# Patient Record
Sex: Female | Born: 1985
Health system: Southern US, Community
[De-identification: ages and names within clinical notes are randomized; demographics above are authoritative.]

## PROBLEM LIST (undated history)

## (undated) DIAGNOSIS — M5136 Other intervertebral disc degeneration, lumbar region: Secondary | ICD-10-CM

## (undated) DIAGNOSIS — R519 Headache, unspecified: Secondary | ICD-10-CM

## (undated) DIAGNOSIS — M3119 Other thrombotic microangiopathy: Secondary | ICD-10-CM

## (undated) DIAGNOSIS — R51 Headache: Secondary | ICD-10-CM

## (undated) DIAGNOSIS — H47099 Other disorders of optic nerve, not elsewhere classified, unspecified eye: Secondary | ICD-10-CM

## (undated) DIAGNOSIS — M311 Thrombotic microangiopathy: Secondary | ICD-10-CM

## (undated) DIAGNOSIS — I639 Cerebral infarction, unspecified: Secondary | ICD-10-CM

## (undated) DIAGNOSIS — M51369 Other intervertebral disc degeneration, lumbar region without mention of lumbar back pain or lower extremity pain: Secondary | ICD-10-CM

## (undated) HISTORY — PX: WISDOM TOOTH EXTRACTION: SHX21

## (undated) HISTORY — PX: CHOLECYSTECTOMY: SHX55

## (undated) HISTORY — DX: Cerebral infarction, unspecified: I63.9

## (undated) HISTORY — DX: Headache, unspecified: R51.9

## (undated) HISTORY — DX: Headache: R51

---
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
## 2005-12-13 DIAGNOSIS — M311 Thrombotic microangiopathy: Secondary | ICD-10-CM

## 2006-02-16 ENCOUNTER — Inpatient Hospital Stay (HOSPITAL_COMMUNITY): Admission: AD | Admit: 2006-02-16 | Discharge: 2006-02-17 | Payer: Self-pay | Admitting: Gynecology

## 2006-03-19 ENCOUNTER — Inpatient Hospital Stay (HOSPITAL_COMMUNITY): Admission: AD | Admit: 2006-03-19 | Discharge: 2006-03-19 | Payer: Self-pay | Admitting: Obstetrics & Gynecology

## 2006-06-06 ENCOUNTER — Inpatient Hospital Stay (HOSPITAL_COMMUNITY): Admission: AD | Admit: 2006-06-06 | Discharge: 2006-06-06 | Payer: Self-pay | Admitting: Obstetrics and Gynecology

## 2006-11-19 ENCOUNTER — Emergency Department (HOSPITAL_COMMUNITY): Admission: EM | Admit: 2006-11-19 | Discharge: 2006-11-19 | Payer: Self-pay | Admitting: Emergency Medicine

## 2007-06-27 ENCOUNTER — Inpatient Hospital Stay (HOSPITAL_COMMUNITY): Admission: AD | Admit: 2007-06-27 | Discharge: 2007-06-27 | Payer: Self-pay | Admitting: *Deleted

## 2007-09-18 ENCOUNTER — Emergency Department (HOSPITAL_COMMUNITY): Admission: EM | Admit: 2007-09-18 | Discharge: 2007-09-18 | Payer: Self-pay | Admitting: Emergency Medicine

## 2007-10-17 ENCOUNTER — Other Ambulatory Visit: Admission: RE | Admit: 2007-10-17 | Discharge: 2007-10-17 | Payer: Self-pay | Admitting: Obstetrics and Gynecology

## 2008-01-09 ENCOUNTER — Emergency Department (HOSPITAL_COMMUNITY): Admission: EM | Admit: 2008-01-09 | Discharge: 2008-01-09 | Payer: Self-pay | Admitting: Emergency Medicine

## 2008-03-24 ENCOUNTER — Inpatient Hospital Stay (HOSPITAL_COMMUNITY): Admission: EM | Admit: 2008-03-24 | Discharge: 2008-03-25 | Payer: Self-pay | Admitting: Emergency Medicine

## 2008-03-24 ENCOUNTER — Emergency Department (HOSPITAL_COMMUNITY): Admission: EM | Admit: 2008-03-24 | Discharge: 2008-03-24 | Payer: Self-pay | Admitting: Emergency Medicine

## 2008-03-25 ENCOUNTER — Encounter (INDEPENDENT_AMBULATORY_CARE_PROVIDER_SITE_OTHER): Payer: Self-pay | Admitting: Surgery

## 2008-03-28 ENCOUNTER — Emergency Department (HOSPITAL_COMMUNITY): Admission: EM | Admit: 2008-03-28 | Discharge: 2008-03-28 | Payer: Self-pay | Admitting: Emergency Medicine

## 2008-03-31 ENCOUNTER — Emergency Department (HOSPITAL_COMMUNITY): Admission: EM | Admit: 2008-03-31 | Discharge: 2008-03-31 | Payer: Self-pay | Admitting: Emergency Medicine

## 2008-08-27 ENCOUNTER — Emergency Department (HOSPITAL_COMMUNITY): Admission: EM | Admit: 2008-08-27 | Discharge: 2008-08-28 | Payer: Self-pay | Admitting: Emergency Medicine

## 2008-09-28 ENCOUNTER — Emergency Department (HOSPITAL_COMMUNITY): Admission: EM | Admit: 2008-09-28 | Discharge: 2008-09-28 | Payer: Self-pay | Admitting: Emergency Medicine

## 2009-04-27 ENCOUNTER — Emergency Department (HOSPITAL_COMMUNITY): Admission: EM | Admit: 2009-04-27 | Discharge: 2009-04-27 | Payer: Self-pay | Admitting: Emergency Medicine

## 2009-07-27 ENCOUNTER — Emergency Department (HOSPITAL_COMMUNITY): Admission: EM | Admit: 2009-07-27 | Discharge: 2009-07-28 | Payer: Self-pay | Admitting: Emergency Medicine

## 2009-09-24 ENCOUNTER — Emergency Department (HOSPITAL_COMMUNITY): Admission: EM | Admit: 2009-09-24 | Discharge: 2009-09-24 | Payer: Self-pay | Admitting: Emergency Medicine

## 2009-09-29 ENCOUNTER — Emergency Department (HOSPITAL_COMMUNITY): Admission: EM | Admit: 2009-09-29 | Discharge: 2009-09-29 | Payer: Self-pay | Admitting: Emergency Medicine

## 2009-09-30 ENCOUNTER — Inpatient Hospital Stay (HOSPITAL_COMMUNITY): Admission: EM | Admit: 2009-09-30 | Discharge: 2009-10-04 | Payer: Self-pay | Admitting: Emergency Medicine

## 2009-09-30 LAB — CONVERTED CEMR LAB
ALT: 18 units/L
HCT: 39.3 %
Hemoglobin: 13.6 g/dL
Sodium: 137 meq/L
Total Protein: 6.5 g/dL
WBC: 15.9 10*3/uL

## 2009-10-01 DIAGNOSIS — N12 Tubulo-interstitial nephritis, not specified as acute or chronic: Secondary | ICD-10-CM | POA: Insufficient documentation

## 2009-10-15 ENCOUNTER — Ambulatory Visit: Payer: Self-pay | Admitting: Internal Medicine

## 2009-10-15 ENCOUNTER — Encounter (INDEPENDENT_AMBULATORY_CARE_PROVIDER_SITE_OTHER): Payer: Self-pay | Admitting: Nurse Practitioner

## 2009-10-15 DIAGNOSIS — L503 Dermatographic urticaria: Secondary | ICD-10-CM

## 2009-10-15 DIAGNOSIS — F17200 Nicotine dependence, unspecified, uncomplicated: Secondary | ICD-10-CM

## 2009-10-15 LAB — CONVERTED CEMR LAB
Basophils Absolute: 0 10*3/uL (ref 0.0–0.1)
Basophils Relative: 1 % (ref 0–1)
Bilirubin Urine: NEGATIVE
Eosinophils Absolute: 0.3 10*3/uL (ref 0.0–0.7)
Eosinophils Relative: 4 % (ref 0–5)
Glucose, Urine, Semiquant: NEGATIVE
Ketones, urine, test strip: NEGATIVE
Lymphocytes Relative: 23 % (ref 12–46)
Lymphs Abs: 1.8 10*3/uL (ref 0.7–4.0)
MCHC: 31.9 g/dL (ref 30.0–36.0)
Monocytes Absolute: 0.6 10*3/uL (ref 0.1–1.0)
Monocytes Relative: 8 % (ref 3–12)
Neutro Abs: 5 10*3/uL (ref 1.7–7.7)
Neutrophils Relative %: 65 % (ref 43–77)
Platelets: 311 10*3/uL (ref 150–400)
RBC: 4.48 M/uL (ref 3.87–5.11)
Urobilinogen, UA: 0.2

## 2009-10-16 ENCOUNTER — Encounter (INDEPENDENT_AMBULATORY_CARE_PROVIDER_SITE_OTHER): Payer: Self-pay | Admitting: Nurse Practitioner

## 2009-11-15 ENCOUNTER — Emergency Department (HOSPITAL_COMMUNITY): Admission: EM | Admit: 2009-11-15 | Discharge: 2009-11-15 | Payer: Self-pay | Admitting: Emergency Medicine

## 2009-11-24 ENCOUNTER — Ambulatory Visit: Payer: Self-pay | Admitting: Nurse Practitioner

## 2009-11-24 DIAGNOSIS — H6120 Impacted cerumen, unspecified ear: Secondary | ICD-10-CM

## 2009-11-24 DIAGNOSIS — L408 Other psoriasis: Secondary | ICD-10-CM | POA: Insufficient documentation

## 2009-11-24 LAB — CONVERTED CEMR LAB
Albumin: 4.5 g/dL (ref 3.5–5.2)
Basophils Relative: 1 % (ref 0–1)
Bilirubin Urine: NEGATIVE
Blood in Urine, dipstick: NEGATIVE
CO2: 27 meq/L (ref 19–32)
Calcium: 9.5 mg/dL (ref 8.4–10.5)
Chlamydia, DNA Probe: NEGATIVE
Chloride: 104 meq/L (ref 96–112)
Cholesterol: 197 mg/dL (ref 0–200)
GC Probe Amp, Genital: NEGATIVE
HCT: 41.2 % (ref 36.0–46.0)
Hemoglobin: 13.8 g/dL (ref 12.0–15.0)
KOH Prep: NEGATIVE
Lymphs Abs: 2.1 10*3/uL (ref 0.7–4.0)
MCHC: 33.5 g/dL (ref 30.0–36.0)
MCV: 91.6 fL (ref 78.0–100.0)
Monocytes Absolute: 0.5 10*3/uL (ref 0.1–1.0)
Neutro Abs: 4.8 10*3/uL (ref 1.7–7.7)
Neutrophils Relative %: 62 % (ref 43–77)
Platelets: 284 10*3/uL (ref 150–400)
RBC: 4.5 M/uL (ref 3.87–5.11)
RDW: 13.7 % (ref 11.5–15.5)
Rapid HIV Screen: NEGATIVE
Specific Gravity, Urine: 1.02
Triglycerides: 85 mg/dL (ref <150)
Urobilinogen, UA: 0.2
WBC Urine, dipstick: NEGATIVE
WBC: 7.7 10*3/uL (ref 4.0–10.5)

## 2009-11-27 ENCOUNTER — Encounter (INDEPENDENT_AMBULATORY_CARE_PROVIDER_SITE_OTHER): Payer: Self-pay | Admitting: Nurse Practitioner

## 2009-11-28 ENCOUNTER — Encounter (INDEPENDENT_AMBULATORY_CARE_PROVIDER_SITE_OTHER): Payer: Self-pay | Admitting: Nurse Practitioner

## 2010-01-13 ENCOUNTER — Ambulatory Visit: Payer: Self-pay | Admitting: Nurse Practitioner

## 2010-01-13 ENCOUNTER — Ambulatory Visit (HOSPITAL_COMMUNITY): Admission: RE | Admit: 2010-01-13 | Discharge: 2010-01-13 | Payer: Self-pay | Admitting: Nurse Practitioner

## 2010-01-13 DIAGNOSIS — B86 Scabies: Secondary | ICD-10-CM

## 2010-01-13 DIAGNOSIS — M79609 Pain in unspecified limb: Secondary | ICD-10-CM

## 2010-02-02 ENCOUNTER — Telehealth (INDEPENDENT_AMBULATORY_CARE_PROVIDER_SITE_OTHER): Payer: Self-pay | Admitting: Nurse Practitioner

## 2010-02-02 ENCOUNTER — Emergency Department (HOSPITAL_COMMUNITY): Admission: EM | Admit: 2010-02-02 | Discharge: 2010-02-02 | Payer: Self-pay | Admitting: Emergency Medicine

## 2010-02-04 ENCOUNTER — Ambulatory Visit: Payer: Self-pay | Admitting: Nurse Practitioner

## 2010-02-04 DIAGNOSIS — G44209 Tension-type headache, unspecified, not intractable: Secondary | ICD-10-CM

## 2010-02-05 ENCOUNTER — Encounter (INDEPENDENT_AMBULATORY_CARE_PROVIDER_SITE_OTHER): Payer: Self-pay | Admitting: Nurse Practitioner

## 2010-02-05 ENCOUNTER — Telehealth (INDEPENDENT_AMBULATORY_CARE_PROVIDER_SITE_OTHER): Payer: Self-pay | Admitting: Nurse Practitioner

## 2010-02-05 ENCOUNTER — Emergency Department (HOSPITAL_COMMUNITY): Admission: EM | Admit: 2010-02-05 | Discharge: 2010-02-05 | Payer: Self-pay | Admitting: Emergency Medicine

## 2010-02-06 ENCOUNTER — Encounter (INDEPENDENT_AMBULATORY_CARE_PROVIDER_SITE_OTHER): Payer: Self-pay | Admitting: Nurse Practitioner

## 2010-03-19 ENCOUNTER — Emergency Department (HOSPITAL_COMMUNITY): Admission: EM | Admit: 2010-03-19 | Discharge: 2010-03-20 | Payer: Self-pay | Admitting: Emergency Medicine

## 2010-03-28 ENCOUNTER — Emergency Department (HOSPITAL_COMMUNITY): Admission: EM | Admit: 2010-03-28 | Discharge: 2010-03-28 | Payer: Self-pay | Admitting: Emergency Medicine

## 2010-06-09 ENCOUNTER — Ambulatory Visit: Payer: Self-pay | Admitting: Nurse Practitioner

## 2010-06-09 DIAGNOSIS — K089 Disorder of teeth and supporting structures, unspecified: Secondary | ICD-10-CM | POA: Insufficient documentation

## 2010-12-07 ENCOUNTER — Emergency Department (HOSPITAL_COMMUNITY)
Admission: EM | Admit: 2010-12-07 | Discharge: 2010-12-07 | Payer: Self-pay | Source: Home / Self Care | Admitting: Emergency Medicine

## 2011-01-14 NOTE — Progress Notes (Signed)
Summary: Office Visit/DEPRESSION SCREENING  Office Visit/DEPRESSION SCREENING   Imported By: Arta Bruce 01/08/2010 13:02:30  _____________________________________________________________________  External Attachment:    Type:   Image     Comment:   External Document

## 2011-01-14 NOTE — Letter (Signed)
Summary: REGARDING PT'S LOA POLICY  REGARDING PT'S LOA POLICY   Imported By: Arta Bruce 02/06/2010 09:41:55  _____________________________________________________________________  External Attachment:    Type:   Image     Comment:   External Document

## 2011-01-14 NOTE — Letter (Signed)
Summary: Work Excuse  [facility]  [address] [place], [place] [address]   Phone: [phone]  Fax: [phone]    Today's Date: February 04, 2010  Name of Patient: Krista Maddox  The above named patient had a medical visit today   Please take this into consideration when reviewing the time away from work  Special Instructions:  [  ] None  [ X ] To be off the remainder of today, returning to the normal work schedule February 07, 2010 ( if she is not scheduled to work on this date then she may return on her next scheduled day to work following this date)  [  ] To be off until the next scheduled appointment on ______________________.  [  ] Other ________________________________________________________________ ________________________________________________________________________   Sincerely yours,   Lehman Prom FNP [facility]

## 2011-01-14 NOTE — Progress Notes (Signed)
Summary: JOB FAXED POLICY ABOUT BEING OUT SICK  Phone Note Call from Patient Call back at Home Phone [phone]   Summary of Call: Krista Maddox PT. Krista Maddox  JOB FAXED OVER THE POLICY ABOUT BEING OUT. SHE SAYS THE DR AT THE URGENT CARE WROTE HER OUT FOR 3 DAYS AND 3 DAYS IS NOT ACCEPTABLE ON HER JOB, IT HAS TO BE 5 DAYS  WE CAN CALL HER WHEN ITS READY Initial call taken by: Leodis Rains,  February 05, 2010 9:33 AM  Follow-up for Phone Call        was it faxed to this office as well.  I would like to see it Follow-up by: Lehman Prom FNP,  February 05, 2010 1:44 PM  Additional Follow-up for Phone Call Additional follow up Details #1::        YES, IT WAS FAXED AND ITS IN YOUR SLOTS THAT SAY REFILLS Additional Follow-up by: Leodis Rains,  February 05, 2010 3:07 PM    Additional Follow-up for Phone Call Additional follow up Details #2::    This policy is about the leave of absenceand not the specific absentee policy. It speaks about PTO so can pt not take a PTO day if she is only out for 1-2 days???  If so, then this is what she should be doing instead of taking leave of absence every time she has an illness. I don't think headaches necessarily qualifies for LOA but given the policy I was sent and have reviewed will write for the additional 2 days out of work to total 5 days (ER wrote 3 days off) letter in basket - does she want it faxed or will she pick up? Follow-up by: Lehman Prom FNP,  February 05, 2010 5:23 PM  Additional Follow-up for Phone Call Additional follow up Details #3:: Details for Additional Follow-up Action Taken: Levon Hedger  February 06, 2010 12:46 PM Left message on machine for pt to return call to the office.  FYI: spoke with Krista Maddox and she is already back at work but she just wanted you to know thanks for trying but she never heard anything back. I did advise that someone left her a message on 2/25. Pt stated she only needed the letter because she  hadn't worked there long enough to use PTO.Marland KitchenShe no longer needs the letter but thank you..........Marland KitchenMikey College CMA  February 12, 2010 11:17 AM   noted.  That answers my question. Pt does not have PTO which is why she has to keep using LOA n.martin,fnp February 12, 2010  7:21 PM

## 2011-01-14 NOTE — Progress Notes (Signed)
Summary: migranes/no available appts  Phone Note Call from Patient Call back at Home Phone [phone]   Summary of Call: pt [place] calling stating that she has a cold and has been having migranes for the last 2wks. she has tried otc meds with no relief. pt uses [employer]...did advise pt that I would forward information to provider..she has already missed 2 days of work due to [employer] per patient.pt states cold started today. Initial call taken by: Mikey [employer] CMA,  February 02, 2010 8:35 AM  Follow-up for Phone Call        if pt is having a migraine now and it won't break she will need to go to ER or urgent care.   No record of migraines in EMR so she has not been seen for this previously. Cold symptoms - advised she use over the counter cough and cold medications Follow-up by: Lehman Prom FNP,  February 02, 2010 12:38 PM  Additional Follow-up for Phone Call Additional follow up Details #1::        pt informed of above information. Additional Follow-up by: Levon Hedger,  February 02, 2010 2:56 PM

## 2011-01-14 NOTE — Assessment & Plan Note (Signed)
Summary: Dental problems   Vital Signs:  Patient profile:   25 year old female Weight:      215.7 pounds BMI:     35.21 BSA:     2.06 Temp:     9.0 degrees F oral Pulse rate:   89 / minute Pulse rhythm:   regular Resp:     20 per minute BP sitting:   118 / 81  (left arm) Cuff size:   regular  Vitals Entered By: Levon Hedger (June 09, 2010 9:08 AM) CC: broken tooth pain for 2 days Is Patient Diabetic? No Pain Assessment Patient in pain? yes     Location: tooth Intensity: 10 Type: throbbing Onset of pain  Constant  Does patient need assistance? Functional Status Self care Ambulation Normal   CC:  broken tooth pain for 2 days.  History of Present Illness:  Pt into the office with tooth problems. Pt was eating a piece of ice 2 days ago and tooth broke. She has tried [employer], oragel and clove gel along with excedrin, ibuprofen which has not eliminated pain and discomfort No dental care in over 2 years No fever No swelling of jaw but tenderness of gum unable to eat on the left side.  Food is at room temperature  Tobacco use continues  Otherwise pt is doing ok.  No chronic issues  Habits & Providers  Alcohol-Tobacco-Diet     Alcohol drinks/day: <1     Alcohol Counseling: not indicated; use of alcohol is not excessive or problematic     Tobacco Status: current     Tobacco Counseling: to remain off tobacco products     Cigarette Packs/Day: 0.5     Year Started: age 50  Exercise-Depression-Behavior     Does Patient Exercise: yes     Exercise Counseling: not indicated; exercise is adequate     Type of exercise: walking     Depression Counseling: not indicated; screening negative for depression  Allergies (verified): No Known Drug Allergies  Review of Systems General:  Denies fever; +mouth pain. CV:  Denies chest pain or discomfort. Resp:  Denies cough. GI:  Denies abdominal pain, nausea, and vomiting.  Physical Exam  General:  alert.   Nose:  left  nasal piercing Mouth:  discoloration - poor dentation multiple caries  left lower back molar - broken, gingiva inflammed, tender Msk:  normal ROM.   Neurologic:  alert & oriented X3.   Psych:  Oriented X3.     Impression & Recommendations:  Problem # 1:  DENTAL PAIN (ICD-525.9)  will refer to the dental clinic but advised pt that there is a wait list wil given pain meds  antibiotics for pt to take only if signs of infection start Orders: Dental Referral (Dentist)  Problem # 2:  TOBACCO ABUSE (ICD-305.1) advised pt that she need to quit smoking  Complete Medication List: 1)  Ibuprofen 800 Mg Tabs (Ibuprofen) .... One tablet by mouth two times a day as needed for swelling 2)  Loratadine 10 Mg Tabs (Loratadine) .... One tablet by mouth daily for allergies 3)  Triamcinolone Acetonide 0.1 % Oint (Triamcinolone acetonide) .... One application topically two times a day as needed to affected area 4)  Betamethasone Dipropionate 0.05 % Crea (Betamethasone dipropionate) .... Apply to affected area two times a day topically until healed 5)  Tramadol Hcl 50 Mg Tabs (Tramadol hcl) .... One tablet by mouth two times a day as needed for pain 6)  Penicillin V Potassium 500  Mg Tabs (Penicillin v potassium) .... One tablet by mouth three times a day for infection 7)  Tylenol With Codeine #3 300-30 Mg Tabs (Acetaminophen-codeine) .... One tablet by mouth two times a day as needed for pain  Patient Instructions: 1)  A referral will be sent to the dental clinic.  They will notify you of the time/date of the appointment.  There is a wait list 2)  Pain - this may get better over the next 2-3 days.  May take tylenol #3 as needed for pain 3)  Infection - not present at this time but signs for infection are fever, jaw swellling, inflammed gums 4)  Follow up as needed Prescriptions: TYLENOL WITH CODEINE #3 300-30 MG TABS (ACETAMINOPHEN-CODEINE) One tablet by mouth two times a day as needed for pain  #30 x  0   Entered and Authorized by:   Lehman Prom FNP   Signed by:   Lehman Prom FNP on 06/09/2010   Method used:   Print then Give to Patient   RxID:   [id number] PENICILLIN V POTASSIUM 500 MG TABS (PENICILLIN V POTASSIUM) One tablet by mouth three times a day for infection  #30 x 0   Entered and Authorized by:   Lehman Prom FNP   Signed by:   Lehman Prom FNP on 06/09/2010   Method used:   Print then Give to Patient   RxID:   [id number]

## 2011-01-14 NOTE — Letter (Signed)
Summary: Out of Work  [facility]  [address] [place], [place] [address]   Phone: [phone]  Fax: [phone]    January 13, 2010   Employee:  Krista Maddox Herrington    To Whom It May Concern:   For Medical reasons, please excuse the above named employee from work for the following dates:  Start:   January 13, 2010 (Tuesday)  End:   January 20, 2010 (Tuesday)  If you need additional information, please feel free to contact our office.         Sincerely,    Lehman Prom FNP [facility]

## 2011-01-14 NOTE — Letter (Signed)
Summary: Work Excuse  [facility]  [address] [place], [place] [address]   Phone: [phone]  Fax: [phone]    Today's Date: June 09, 2010  Name of Patient: Krista Maddox  The above named patient had a medical visit today   Please take this into consideration when reviewing the time away from work.  She may return to normal work schedule.    Sincerely yours,   Lehman Prom FNP [facility]

## 2011-01-14 NOTE — Letter (Signed)
Summary: REFERRAL DENTAL  REFERRAL DENTAL   Imported By: Arta Bruce 06/10/2010 12:30:41  _____________________________________________________________________  External Attachment:    Type:   Image     Comment:   External Document

## 2011-01-14 NOTE — Assessment & Plan Note (Signed)
Summary: Left foot injury/Scabies   Vital Signs:  Patient profile:   25 year old female Height:      65.75 inches Weight:      210 pounds BMI:     34.28 Temp:     97.0 degrees F oral Pulse rate:   76 / minute Pulse rhythm:   regular Resp:     18 per minute BP sitting:   112 / 80  (left arm) Cuff size:   regular  Vitals Entered By: [place] Shannon (January 13, 2010 8:39 AM) CC: pt is her for broken ankle and would like to get checked..... her child was exposed to scabies and she wants to make sure she doesn't catches it since someone in family has.... Is Patient Diabetic? No Pain Assessment Patient in pain? no       Does patient need assistance? Functional Status Self care Ambulation Normal   CC:  pt is her for broken ankle and would like to get checked..... her child was exposed to scabies and she wants to make sure she doesn't catches it since someone in family has.....  History of Present Illness:  Pt into the office for follow up on left foot. Pt was planning and was running to the car and someone inadvertantly closed the door on her foot. She presents today with a cam boot that she had from a previous injury. Reports that she had a previous fracture to her left foot about 2 years ago and also one prior to thatl +swelling +discoloration Trauma 1 week ago. She has been wearing the cam boot since that time. Pt has to do a lot of walking at work. 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 Also has to walk a long distance to the job and then when she returns. She has not missed any time from work for this injury.  Scabies - Pt's daughter was exposed to scabies and now pt currently has few areas on her arms. Currently a few raised areas to her left arm that itch worse at night  Current Medications (verified): 1)  Tramadol Hcl 50 Mg Tabs (Tramadol Hcl) .... Take One Tablet By Mouth Three Times A  Day As Needed *wagdy Elmahdy,md 2)  Loratadine 10 Mg Tabs (Loratadine) .... One Tablet By Mouth Daily For Allergies 3)  Triamcinolone Acetonide 0.1 % Oint (Triamcinolone Acetonide) .... One Application Topically Two Times A Day As Needed To Affected Area 4)  Betamethasone Dipropionate 0.05 % Crea (Betamethasone Dipropionate) .... Apply To Affected Area Two Times A Day Topically Until Healed 5)  Prednisone 10 Mg Tabs (Prednisone) .... 30mg  X 3 Days Then 20mg  X 2 Days By Mouth For Inflammation  Allergies (verified): No Known Drug Allergies  Review of Systems CV:  Denies chest pain or discomfort. Resp:  Denies cough. GI:  Denies abdominal pain, nausea, and vomiting. MS:  Complains of joint pain.  Physical Exam  General:  alert.   Head:  normocephalic.   Lungs:  normal breath sounds.   Heart:  normal rate and regular rhythm.   Neurologic:  alert & oriented X3.   Skin:  bil arms with several sporatic papsules Psych:  Oriented X3.     Foot/Ankle Exam  Foot Exam:    Left:    Inspection:  Abnormal    Palpation:  Abnormal       Location:  forefoot    Stability:  unstable    Tenderness:  yes    Swelling:  yes    Erythema:  yes   Impression & Recommendations:  Problem # 1:  FOOT PAIN, LEFT (ICD-729.5) Will write order for pt to be out of work for the rest of the week Orders: Radiology other (Radiology Other)  Problem # 2:  SCABIES (ICD-133.0) Assessment: Unchanged will treat given recent exposure advised pt that she needs to clean home  Complete Medication List: 1)  Ibuprofen 800 Mg Tabs (Ibuprofen) .... One tablet by mouth two times a day as needed for swelling 2)  Loratadine 10 Mg Tabs (Loratadine) .... One tablet by mouth daily for allergies 3)  Triamcinolone Acetonide 0.1 % Oint (Triamcinolone acetonide) .... One application topically two times a day as needed to affected area 4)  Betamethasone Dipropionate 0.05 % Crea (Betamethasone dipropionate) .... Apply to  affected area two times a day topically until healed 5)  Permethrin 5 % Crea (Permethrin) .... Massage cream into entire body, wash off after 8-14 hrs 6)  Tramadol Hcl 50 Mg Tabs (Tramadol hcl) .... One tablet by mouth two times a day as needed for pain  Patient Instructions: 1)  You will need to get your left foot xray. 2)  You will be informed of the results. 3)  Take ibuprofen 800mg  - 1 tablet by mouth two times a day as needed  4)  You will need to take a week after work. 5)  Keep the [place] in place. 6)  Use the Permethrin cream to body.  Let sit for at least 8 hours then rinse off Prescriptions: TRAMADOL HCL 50 MG TABS (TRAMADOL HCL) One tablet by mouth two times a day as needed for pain  #50 x 1   Entered and Authorized by:   Lehman Prom FNP   Signed by:   Lehman Prom FNP on 01/13/2010   Method used:   Print then Give to Patient   RxID:   [id number] IBUPROFEN 800 MG TABS (IBUPROFEN) One tablet by mouth two times a day as needed for swelling  #60 x 1   Entered and Authorized by:   Lehman Prom FNP   Signed by:   Lehman Prom FNP on 01/13/2010   Method used:   Print then Give to Patient   RxID:   [id number] PERMETHRIN 5 % CREA (PERMETHRIN) Massage cream into entire body, wash off after 8-14 hrs  #60gm x 0   Entered and Authorized by:   Lehman Prom FNP   Signed by:   Lehman Prom FNP on 01/13/2010   Method used:   Print then Give to Patient   RxID:   [id number]

## 2011-01-14 NOTE — Assessment & Plan Note (Signed)
Summary: Tension Headache   Vital Signs:  Patient profile:   25 year old female LMP:     01/13/2010 Weight:      212.1 pounds BMI:     34.62 BSA:     2.05 Temp:     98.2 degrees F oral Pulse rate:   106 / minute Pulse rhythm:   regular Resp:     20 per minute BP sitting:   124 / 80  (left arm) Cuff size:   regular  Vitals Entered By: Levon Hedger (February 04, 2010 3:01 PM) CC: migraine headache x 1 month ago, Headache Is Patient Diabetic? No Pain Assessment Patient in pain? yes     Location: headache Intensity: 8  Does patient need assistance? Functional Status Self care Ambulation Normal LMP (date): 01/13/2010 LMP - Character: normal     Menstrual flow (days): 5 Enter LMP: 01/13/2010 Last PAP Result  Specimen Adequacy: Satisfactory for evaluation.   Interpretation/Result:Negative for intraepithelial Lesion or Malignancy.      CC:  migraine headache x 1 month ago and Headache.  History of Present Illness:  Pt into the office with complaints of daily headaches for the past month. No previous history of headaches but for the past month headache present when she wakes Light affects the eyes wears sunglasses at woke No neck pain  Pt was seen in the ER on 02/02/2010 and was started on amitriptyline 50mg  by mouth nightly, prednisone 20mg  by mouth daily x 5 days, then metoclopramide 10mg  by mouth three times a day as needed for nausea or migraine.   Amitriptyline does make the pt sleepy but headache resumes when she gets up  Increased stressors: Daughter will need surgery for GI problems.  March 9th child will need a barium enemia then child will need surgery Grandfather passed this week and service was today. Sleep problems also started with the onset of this bad news.  Pt is an Designer, television/film set for [employer].   Headache HPI:      She has approximately 5+ headaches per month.  There is no family history of migraine headaches.        On a scale of 1-10, she describes the  headache as a 5.  The headaches are not associated with an aura.  The location of the headaches are bilateral.  Headache quality is throbbing or pulsating.  The headaches are associated with nausea and photophobia.        The patient denies new or progressive H/A lasting days.        Additional history: Pt has taken tylenol without resolution of the symptoms. She went to the Urgent on 02/02/2010 and was given several rx.    Headache Treatment History:      She has tried NSAID's which were effective.  Tricyclic antidepressants seemed effective but she had side effects with these.     Medications Prior to Update: 1)  Ibuprofen 800 Mg Tabs (Ibuprofen) .... One Tablet By Mouth Two Times A Day As Needed For Swelling 2)  Loratadine 10 Mg Tabs (Loratadine) .... One Tablet By Mouth Daily For Allergies 3)  Triamcinolone Acetonide 0.1 % Oint (Triamcinolone Acetonide) .... One Application Topically Two Times A Day As Needed To Affected Area 4)  Betamethasone Dipropionate 0.05 % Crea (Betamethasone Dipropionate) .... Apply To Affected Area Two Times A Day Topically Until Healed 5)  Permethrin 5 % Crea (Permethrin) .... Massage Cream Into Entire Body, Wash Off After 8-14 Hrs 6)  Tramadol Hcl 50 Mg Tabs (  Tramadol Hcl) .... One Tablet By Mouth Two Times A Day As Needed For Pain  Allergies (verified): No Known Drug Allergies  Review of Systems CV:  Denies chest pain or discomfort. Resp:  Denies cough. GI:  Denies abdominal pain, nausea, and vomiting. Neuro:  Complains of headaches.  Physical Exam  General:  alert.   Head:  normocephalic.   Nose:  left nasal piercing Mouth:  discolored Lungs:  normal breath sounds.   Heart:  normal rate and regular rhythm.   Neurologic:  alert & oriented X3.   Skin:  color normal.   Psych:  Oriented X3.     Impression & Recommendations:  Problem # 1:  HEADACHE, TENSION (ICD-307.81) advised pt of the dx pt can continue current meds Her updated medication  list for this problem includes:    Ibuprofen 800 Mg Tabs (Ibuprofen) ..... One tablet by mouth two times a day as needed for swelling    Tramadol Hcl 50 Mg Tabs (Tramadol hcl) ..... One tablet by mouth two times a day as needed for pain  Complete Medication List: 1)  Ibuprofen 800 Mg Tabs (Ibuprofen) .... One tablet by mouth two times a day as needed for swelling 2)  Loratadine 10 Mg Tabs (Loratadine) .... One tablet by mouth daily for allergies 3)  Triamcinolone Acetonide 0.1 % Oint (Triamcinolone acetonide) .... One application topically two times a day as needed to affected area 4)  Betamethasone Dipropionate 0.05 % Crea (Betamethasone dipropionate) .... Apply to affected area two times a day topically until healed 5)  Tramadol Hcl 50 Mg Tabs (Tramadol hcl) .... One tablet by mouth two times a day as needed for pain  Patient Instructions: 1)  Continue medications as ordered 2)  Take ibuprofen during the day and amitriptyline at night as needed for sleep 3)  Follow up as needed. 4)  BRING A COPY OF YOUR ATTENDANCE POLICY TO THIS OFFICE

## 2011-01-14 NOTE — Letter (Signed)
Summary: Handout Printed  Printed Handout:  - Headache, Tension (Muscle Contraction Headache)

## 2011-02-23 ENCOUNTER — Emergency Department (HOSPITAL_COMMUNITY)
Admission: EM | Admit: 2011-02-23 | Discharge: 2011-02-23 | Discharge status: Against Medical Advice | Payer: Medicaid Other | Attending: Emergency Medicine | Admitting: Emergency Medicine

## 2011-02-23 ENCOUNTER — Emergency Department (HOSPITAL_COMMUNITY): Payer: Medicaid Other

## 2011-02-23 DIAGNOSIS — M545 Low back pain, unspecified: Secondary | ICD-10-CM | POA: Insufficient documentation

## 2011-02-23 LAB — URINALYSIS, ROUTINE W REFLEX MICROSCOPIC
Glucose, UA: NEGATIVE mg/dL
Hgb urine dipstick: NEGATIVE
Nitrite: NEGATIVE
Protein, ur: NEGATIVE mg/dL
Specific Gravity, Urine: 1.019 (ref 1.005–1.030)
Urobilinogen, UA: 1 mg/dL (ref 0.0–1.0)

## 2011-03-03 LAB — URINALYSIS, ROUTINE W REFLEX MICROSCOPIC
Bilirubin Urine: NEGATIVE
Glucose, UA: NEGATIVE mg/dL
Hgb urine dipstick: NEGATIVE
Ketones, ur: NEGATIVE mg/dL
Nitrite: NEGATIVE
Specific Gravity, Urine: 1.013 (ref 1.005–1.030)
Specific Gravity, Urine: 1.022 (ref 1.005–1.030)
Urobilinogen, UA: 0.2 mg/dL (ref 0.0–1.0)
pH: 7.5 (ref 5.0–8.0)

## 2011-03-03 LAB — URINE CULTURE

## 2011-03-03 LAB — CBC
HCT: 36.3 % (ref 36.0–46.0)
Hemoglobin: 12.3 g/dL (ref 12.0–15.0)
MCHC: 33.9 g/dL (ref 30.0–36.0)
Platelets: 189 10*3/uL (ref 150–400)

## 2011-03-03 LAB — PREGNANCY, URINE: Preg Test, Ur: NEGATIVE

## 2011-03-03 LAB — COMPREHENSIVE METABOLIC PANEL
ALT: 14 U/L (ref 0–35)
AST: 19 U/L (ref 0–37)
Albumin: 3.6 g/dL (ref 3.5–5.2)
Alkaline Phosphatase: 61 U/L (ref 39–117)
Calcium: 8.7 mg/dL (ref 8.4–10.5)
Creatinine, Ser: 0.65 mg/dL (ref 0.4–1.2)
Potassium: 3.7 mEq/L (ref 3.5–5.1)

## 2011-03-03 LAB — DIFFERENTIAL
Basophils Absolute: 0 10*3/uL (ref 0.0–0.1)
Basophils Relative: 1 % (ref 0–1)
Eosinophils Absolute: 0.1 10*3/uL (ref 0.0–0.7)
Lymphocytes Relative: 18 % (ref 12–46)
Lymphs Abs: 0.9 10*3/uL (ref 0.7–4.0)
Monocytes Absolute: 0.5 10*3/uL (ref 0.1–1.0)

## 2011-03-03 LAB — POCT PREGNANCY, URINE: Preg Test, Ur: NEGATIVE

## 2011-03-05 LAB — POCT I-STAT, CHEM 8
Chloride: 95 mEq/L — ABNORMAL LOW (ref 96–112)
Creatinine, Ser: 5.1 mg/dL — ABNORMAL HIGH (ref 0.4–1.2)
Glucose, Bld: 387 mg/dL — ABNORMAL HIGH (ref 70–99)
HCT: 36 % (ref 36.0–46.0)
Hemoglobin: 12.2 g/dL (ref 12.0–15.0)
Potassium: 4 mEq/L (ref 3.5–5.1)
Sodium: 134 mEq/L — ABNORMAL LOW (ref 135–145)

## 2011-03-18 LAB — BASIC METABOLIC PANEL
BUN: 4 mg/dL — ABNORMAL LOW (ref 6–23)
BUN: 5 mg/dL — ABNORMAL LOW (ref 6–23)
CO2: 25 mEq/L (ref 19–32)
Chloride: 103 mEq/L (ref 96–112)
Creatinine, Ser: 0.63 mg/dL (ref 0.4–1.2)
Creatinine, Ser: 0.74 mg/dL (ref 0.4–1.2)
GFR calc Af Amer: >60 mL/min (ref >60)
GFR calc non Af Amer: >60 mL/min (ref >60)
GFR calc non Af Amer: >60 mL/min (ref >60)
Glucose, Bld: 109 mg/dL — ABNORMAL HIGH (ref 70–99)
Potassium: 3.7 mEq/L (ref 3.5–5.1)
Potassium: 4.1 mEq/L (ref 3.5–5.1)
Potassium: 4.2 mEq/L (ref 3.5–5.1)
Sodium: 135 mEq/L (ref 135–145)

## 2011-03-18 LAB — CBC
HCT: 36.3 % (ref 36.0–46.0)
HCT: 39 % (ref 36.0–46.0)
HCT: 39.3 % (ref 36.0–46.0)
HCT: 40 % (ref 36.0–46.0)
Hemoglobin: 12.5 g/dL (ref 12.0–15.0)
Hemoglobin: 13.1 g/dL (ref 12.0–15.0)
Hemoglobin: 13.6 g/dL (ref 12.0–15.0)
MCHC: 34.7 g/dL (ref 30.0–36.0)
MCV: 91.5 fL (ref 78.0–100.0)
MCV: 93.1 fL (ref 78.0–100.0)
MCV: 93.1 fL (ref 78.0–100.0)
Platelets: 177 10*3/uL (ref 150–400)
Platelets: 179 10*3/uL (ref 150–400)
Platelets: 226 10*3/uL (ref 150–400)
RBC: 3.91 MIL/uL (ref 3.87–5.11)
RBC: 4.19 MIL/uL (ref 3.87–5.11)
RDW: 13.4 % (ref 11.5–15.5)
RDW: 13.5 % (ref 11.5–15.5)
WBC: 10.9 10*3/uL — ABNORMAL HIGH (ref 4.0–10.5)
WBC: 8.5 10*3/uL (ref 4.0–10.5)

## 2011-03-18 LAB — CULTURE, BLOOD (SINGLE): Culture: NO GROWTH

## 2011-03-18 LAB — DIFFERENTIAL
Basophils Absolute: 0 10*3/uL (ref 0.0–0.1)
Basophils Relative: 0 % (ref 0–1)
Lymphocytes Relative: 18 % (ref 12–46)
Monocytes Absolute: 0.8 10*3/uL (ref 0.1–1.0)
Monocytes Relative: 5 % (ref 3–12)
Neutro Abs: 12.1 10*3/uL — ABNORMAL HIGH (ref 1.7–7.7)
Neutrophils Relative %: 76 % (ref 43–77)

## 2011-03-18 LAB — COMPREHENSIVE METABOLIC PANEL
Albumin: 3.4 g/dL — ABNORMAL LOW (ref 3.5–5.2)
Alkaline Phosphatase: 68 U/L (ref 39–117)
BUN: 8 mg/dL (ref 6–23)
Creatinine, Ser: 0.73 mg/dL (ref 0.4–1.2)
Glucose, Bld: 115 mg/dL — ABNORMAL HIGH (ref 70–99)
Potassium: 3.9 mEq/L (ref 3.5–5.1)
Total Protein: 6.5 g/dL (ref 6.0–8.3)

## 2011-03-18 LAB — URINALYSIS, ROUTINE W REFLEX MICROSCOPIC
Bilirubin Urine: NEGATIVE
Glucose, UA: NEGATIVE mg/dL
Ketones, ur: NEGATIVE mg/dL
Nitrite: NEGATIVE
Nitrite: NEGATIVE
Nitrite: POSITIVE — AB
Protein, ur: 30 mg/dL — AB
Specific Gravity, Urine: 1.011 (ref 1.005–1.030)
Urobilinogen, UA: 0.2 mg/dL (ref 0.0–1.0)
Urobilinogen, UA: 2 mg/dL — ABNORMAL HIGH (ref 0.0–1.0)
pH: 6 (ref 5.0–8.0)
pH: 6.5 (ref 5.0–8.0)

## 2011-03-18 LAB — URINE CULTURE: Culture: NO GROWTH

## 2011-03-18 LAB — URINE MICROSCOPIC-ADD ON

## 2011-03-18 LAB — POCT PREGNANCY, URINE: Preg Test, Ur: NEGATIVE

## 2011-03-20 LAB — CBC
HCT: 37.2 % (ref 36.0–46.0)
Hemoglobin: 13 g/dL (ref 12.0–15.0)
WBC: 8.4 10*3/uL (ref 4.0–10.5)

## 2011-03-23 LAB — URINALYSIS, ROUTINE W REFLEX MICROSCOPIC
Glucose, UA: NEGATIVE mg/dL
Hgb urine dipstick: NEGATIVE
Specific Gravity, Urine: 1.025 (ref 1.005–1.030)
pH: 6 (ref 5.0–8.0)

## 2011-03-23 LAB — CBC
HCT: 38.7 % (ref 36.0–46.0)
MCV: 92.5 fL (ref 78.0–100.0)
RBC: 4.18 MIL/uL (ref 3.87–5.11)
WBC: 9.5 10*3/uL (ref 4.0–10.5)

## 2011-03-23 LAB — COMPREHENSIVE METABOLIC PANEL
ALT: 16 U/L (ref 0–35)
Alkaline Phosphatase: 64 U/L (ref 39–117)
BUN: 11 mg/dL (ref 6–23)
CO2: 27 mEq/L (ref 19–32)
Chloride: 107 mEq/L (ref 96–112)
Glucose, Bld: 92 mg/dL (ref 70–99)
Potassium: 3.7 mEq/L (ref 3.5–5.1)
Sodium: 140 mEq/L (ref 135–145)
Total Bilirubin: 0.7 mg/dL (ref 0.3–1.2)
Total Protein: 6.7 g/dL (ref 6.0–8.3)

## 2011-03-23 LAB — DIFFERENTIAL
Basophils Absolute: 0 10*3/uL (ref 0.0–0.1)
Basophils Relative: 0 % (ref 0–1)
Eosinophils Absolute: 0.2 10*3/uL (ref 0.0–0.7)
Monocytes Relative: 5 % (ref 3–12)
Neutro Abs: 6.8 10*3/uL (ref 1.7–7.7)
Neutrophils Relative %: 72 % (ref 43–77)

## 2011-04-27 NOTE — Op Note (Signed)
NAME:  MODESTINE, SCHERZINGER            ACCOUNT NO.:  [id number]   MEDICAL RECORD NO.:  [id number]          PATIENT TYPE:  INP   LOCATION:  1514                         FACILITY:  [facility]   PHYSICIAN:  Sandria Bales. Ezzard Standing, M.D.  DATE OF BIRTH:  1986-05-19   DATE OF PROCEDURE:  03/25/2008  DATE OF DISCHARGE:                               OPERATIVE REPORT   Date of Surgery ???   PREOPERATIVE DIAGNOSIS:  Chronic cholecystitis and cholelithiasis.   POSTOPERATIVE DIAGNOSIS:  Chronic cholecystitis and cholelithiasis   PROCEDURE:  Laparoscopic cholecystectomy with cholangiogram.   SURGEON:  Ovidio Kin, M.D.   FIRST ASSISTANT:  None.   ANESTHESIA:  General endotracheal with 30 mL of 0.25% Marcaine.   COMPLICATIONS:  None.   INDICATIONS FOR PROCEDURE:  Ms. Beth is a 25 year old white female  who is a patient of Dr. Deatra James who presented to the [facility]  emergency room twice yesterday on March 25, 2007.  The first time she  came with some abdominal pain, nausea and vomiting.  She was sent home  and then returned and was then admitted by Dr. Baruch Merl.  Ms.  Bachtell was found to have multiple gallstones.  Her symptoms were  consistent with cholecystitis.  Dr. Colin Benton asked if I would proceed with  Ms. Ashley Royalty' surgery.   I discussed with Ms. Ashley Royalty the indications, potential complications  of gallbladder surgery.  Potential complications include, but not  limited to, bleeding, infection, bile duct injury and the possibly of  open surgery.   OPERATIVE NOTE:  The patient was placed in the supine position and given  a general endotracheal anesthetic.  Her abdomen was prepped with  antibiotic solution and sterilely draped.   A time-out was held identifying the patient and procedure.  An  infraumbilical incision was made with sharp dissection carried down to  the abdominal cavity.  A 10 mm Hasson trocar was inserted into the  abdominal cavity and secured with a 0 Vicryl suture.   Three additional  trocars were placed; a 10 mm subxiphoid trocar, a 5 mm mid subcostal and  5 mm lateral subcostal.  Abdominal exploration with the endoscope  revealed right and left lobes of liver unremarkable.  Stomach  unremarkable.  The gallbladder had some adhesions down around the neck  of the gallbladder.  I swung the scope into the pelvis.  She did have  adhesions of omentum to her lower abdominal wall from a prior C-section  scar, but these were well away from her upper abdomen.  I left these  alone.   I then grasped the gallbladder, rotated it cephalad, dissected out the  cystic duct gallbladder junction.  I placed a clip on the gallbladder  side of the cystic duct and shot intraoperative cholangiogram.   The intraoperative cholangiogram was shot using cutoff Taut catheter  inserted through a 14 gauge Jelco and into the side of the cystic duct.  The Taut catheter was secured with an Endo clip.  I used about 8 mL of  half-strength Hypaque solution that I injected under fluoroscopy.  This  showed free flow of contrast  down the common bile duct, cystic into the  common bile again into the duodenum and up the hepatic radicals.  There  was no filling defect, no masses, felt to be a normal intraoperative  cholangiogram.   The Taut catheter was then removed.  The cystic duct triply endo clipped  and divided.  There was an anterior and posterior branch of the cystic  artery identified and this was doubly endo clipped and divided.  I  visualized well the triangle of Calot.  I then divided the gallbladder  away from the gallbladder bed using primarily hook Bovie coagulation.  Prior to complete division of the gallbladder from the gallbladder bed,  I revisualized the triangle of Calot.  I revisualized the cystic duct  and there was no bleeding or bile leak.   The gallbladder was then placed in an EndoCatch bag and delivered  through the umbilicus.  The abdomen was irrigated with a  liter of  saline.  The umbilical port was then closed with 0 Vicryl suture.  Each  port was then removed in turn.  The skin at each site was closed with a  4-0 Monocryl suture painted with tincture of Benzoin and Steri-Strips.  Sponge and needle count were correct at the end of the case.   The patient tolerated the procedure well, was transported to the  recovery room in good condition.      Sandria Bales. Ezzard Standing, M.D.  Electronically Signed     DHN/MEDQ  D:  03/25/2008  T:  03/25/2008  Job:  [id number]   cc:   Deatra James, M.D.  Fax: [phone]

## 2011-04-27 NOTE — H&P (Signed)
NAMENEELEY, SEDIVY NO.:  [id number]   MEDICAL RECORD NO.:  [id number]          PATIENT TYPE:  INP   LOCATION:  1514                         FACILITY:  [facility]   PHYSICIAN:  Alfonse Ras, MD   DATE OF BIRTH:  05-Jun-1986   DATE OF ADMISSION:  03/24/2008  DATE OF DISCHARGE:  03/25/2008                              HISTORY & PHYSICAL   HISTORY OF PRESENT ILLNESS:  The patient is a 25 year old white female  with acute onset of abdominal pain yesterday morning.  She was seen in  the [facility] Emergency Room and evaluated.  She was found to have  cholelithiasis with normal common bile duct diameter, normal liver  function test, and normal white count.  She was sent home with pain  medicine; however, began having some nausea and vomiting at home and  returned to the emergency room.  She is readmitted for laparoscopic  cholecystectomy.   PAST MEDICAL HISTORY:  None.   PAST SURGICAL HISTORY:  Significant for C-section.   MEDICATIONS:  None.   ALLERGIES:  She has no known drug allergies.   REVIEW OF SYSTEMS:  Significant only as above and is negative for  acholic stools or dark urine.   PHYSICAL EXAMINATION:  General:  She is awake, alert and appropriate and  in no apparent distress.  Her temperature is 98.6, heart rate is 86,  respiratory rate is 16, and blood pressure is 114/70.  HEENT EXAM:  Benign, normocephalic, and atraumatic. Pupils are equal,  round, and reactive to light.  NECK:  Supple and soft without thyromegaly or cervical adenopathy.  LUNGS:  Clear to auscultation, percussion x2.  HEART:  Regular, rate and rhythm without murmurs, rubs, or gallops.  ABDOMEN:  Soft, minimally tender in the epigastrium only to deep  palpation.  EXTREMITIES:  Show no clubbing, cyanosis, or edema.  PSYCHIATRIC EXAM:  Shows good insight and judgment.  She is a full time  student and understands discussions about her surgical procedure.   IMPRESSION:  Symptomatic  cholelithiasis.   PLAN:  Laparoscopic cholecystotomy with  intraoperative cholangiogram by  Dr. Ezzard Standing and myself.   Of note:  I have discussed the risk of the bleeding, infection, injury  to adjacent organs, anesthesia complications and the like with the  patient.  All the questions were answered and she would like to proceed.      Alfonse Ras, MD  Electronically Signed     KRE/MEDQ  D:  03/25/2008  T:  03/25/2008  Job:  [id number]
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
## 2011-09-02 LAB — LIPASE, BLOOD: Lipase: 15

## 2011-09-02 LAB — COMPREHENSIVE METABOLIC PANEL
ALT: 11
Alkaline Phosphatase: 66
BUN: 6
CO2: 23
GFR calc non Af Amer: >60
Glucose, Bld: 89
Potassium: 3.3 — ABNORMAL LOW
Sodium: 135
Total Bilirubin: 1.2

## 2011-09-02 LAB — URINALYSIS, ROUTINE W REFLEX MICROSCOPIC
Glucose, UA: NEGATIVE
Hgb urine dipstick: NEGATIVE
Ketones, ur: NEGATIVE
Protein, ur: NEGATIVE
Urobilinogen, UA: 0.2

## 2011-09-02 LAB — DIFFERENTIAL
Basophils Absolute: 0
Basophils Relative: 0
Eosinophils Absolute: 0.1
Monocytes Relative: 7
Neutrophils Relative %: 86 — ABNORMAL HIGH

## 2011-09-02 LAB — POCT PREGNANCY, URINE
Operator id: 261601
Preg Test, Ur: NEGATIVE

## 2011-09-02 LAB — CBC
HCT: 35.4 — ABNORMAL LOW
Hemoglobin: 12.2
RBC: 3.97

## 2011-09-07 LAB — CBC
HCT: 42
HCT: 41.7
HCT: 43
Hemoglobin: 13.4
Hemoglobin: 13.8
Hemoglobin: 14.4
MCHC: 32.8
MCHC: 33.3
MCHC: 33.5
MCV: 91.1
MCV: 90.6
Platelets: 227
Platelets: 201
RBC: 4.61
RBC: 4.62
RBC: 4.74
RDW: 13.7
RDW: 14.1
RDW: 13.8
WBC: 11.2 — ABNORMAL HIGH
WBC: 8.4

## 2011-09-07 LAB — PREGNANCY, URINE
Preg Test, Ur: NEGATIVE
Preg Test, Ur: NEGATIVE

## 2011-09-07 LAB — COMPREHENSIVE METABOLIC PANEL
ALT: 17
AST: 40 — ABNORMAL HIGH
Alkaline Phosphatase: 107
BUN: 6
BUN: 6
BUN: 7
CO2: 25
CO2: 27
Calcium: 9
Calcium: 9.3
Chloride: 104
Chloride: 106
Creatinine, Ser: 0.67
GFR calc Af Amer: >60
GFR calc non Af Amer: >60
GFR calc non Af Amer: >60
Glucose, Bld: 92
Glucose, Bld: 84
Potassium: 4.4
Sodium: 139
Total Bilirubin: 1.5 — ABNORMAL HIGH
Total Bilirubin: 1.7 — ABNORMAL HIGH
Total Protein: 6.5
Total Protein: 7.4

## 2011-09-07 LAB — LIPASE, BLOOD: Lipase: 18

## 2011-09-07 LAB — URINALYSIS, ROUTINE W REFLEX MICROSCOPIC
Bilirubin Urine: NEGATIVE
Glucose, UA: NEGATIVE
Hgb urine dipstick: NEGATIVE
Hgb urine dipstick: NEGATIVE
Ketones, ur: NEGATIVE
Nitrite: NEGATIVE
Nitrite: NEGATIVE
Protein, ur: NEGATIVE
Protein, ur: NEGATIVE
Specific Gravity, Urine: 1.007
Specific Gravity, Urine: 1.023
Urobilinogen, UA: 0.2
Urobilinogen, UA: 1
pH: 6

## 2011-09-07 LAB — DIFFERENTIAL
Basophils Absolute: 0
Basophils Absolute: 0
Basophils Relative: 0
Basophils Relative: 1
Eosinophils Absolute: 0.1
Eosinophils Absolute: 0.2
Eosinophils Relative: 1
Eosinophils Relative: 2
Lymphocytes Relative: 13
Lymphocytes Relative: 17
Lymphs Abs: 1.4
Lymphs Abs: 1.9
Lymphs Abs: 1.4
Lymphs Abs: 2.5
Monocytes Absolute: 0.1
Monocytes Absolute: 0.5
Monocytes Relative: 1 — ABNORMAL LOW
Monocytes Relative: 6
Monocytes Relative: 4
Neutro Abs: 6.1
Neutro Abs: 9.6 — ABNORMAL HIGH
Neutro Abs: 6.4
Neutrophils Relative %: 70
Neutrophils Relative %: 86 — ABNORMAL HIGH
Neutrophils Relative %: 76

## 2011-09-07 LAB — URINE CULTURE

## 2011-09-07 LAB — PROTIME-INR
INR: 0.9
Prothrombin Time: 12.2

## 2011-09-07 LAB — APTT: aPTT: 26

## 2011-09-07 LAB — POCT PREGNANCY, URINE: Preg Test, Ur: NEGATIVE

## 2011-09-13 LAB — PREGNANCY, URINE: Preg Test, Ur: NEGATIVE

## 2011-09-13 LAB — URINALYSIS, ROUTINE W REFLEX MICROSCOPIC
Bilirubin Urine: NEGATIVE
Ketones, ur: NEGATIVE
Nitrite: NEGATIVE
Specific Gravity, Urine: 1.029
Urobilinogen, UA: 0.2

## 2011-09-13 LAB — CBC
Hemoglobin: 14.7
MCV: 91.9
RBC: 4.68
WBC: 9.4

## 2011-09-13 LAB — DIFFERENTIAL
Lymphs Abs: 0.4 — ABNORMAL LOW
Monocytes Absolute: 0.2
Monocytes Relative: 2 — ABNORMAL LOW
Neutro Abs: 8.7 — ABNORMAL HIGH
Neutrophils Relative %: 93 — ABNORMAL HIGH

## 2011-09-13 LAB — BASIC METABOLIC PANEL
Calcium: 8.7
Chloride: 105
Creatinine, Ser: 0.69
GFR calc Af Amer: >60
Sodium: 137

## 2011-09-13 LAB — URINE MICROSCOPIC-ADD ON

## 2011-09-13 LAB — RAPID STREP SCREEN (MED CTR MEBANE ONLY): Streptococcus, Group A Screen (Direct): NEGATIVE

## 2011-09-27 LAB — URINALYSIS, ROUTINE W REFLEX MICROSCOPIC
Nitrite: NEGATIVE
Specific Gravity, Urine: 1.01
pH: 6.5

## 2011-09-27 LAB — POCT PREGNANCY, URINE: Operator id: 22333

## 2012-02-26 ENCOUNTER — Emergency Department (HOSPITAL_BASED_OUTPATIENT_CLINIC_OR_DEPARTMENT_OTHER)
Admission: EM | Admit: 2012-02-26 | Discharge: 2012-02-27 | Disposition: A | Discharge status: Home / Self Care | Payer: Medicaid Other | Attending: Emergency Medicine | Admitting: Emergency Medicine

## 2012-02-26 ENCOUNTER — Encounter (HOSPITAL_BASED_OUTPATIENT_CLINIC_OR_DEPARTMENT_OTHER): Payer: Self-pay | Admitting: *Deleted

## 2012-02-26 ENCOUNTER — Emergency Department (INDEPENDENT_AMBULATORY_CARE_PROVIDER_SITE_OTHER): Payer: Medicaid Other

## 2012-02-26 DIAGNOSIS — R109 Unspecified abdominal pain: Secondary | ICD-10-CM | POA: Insufficient documentation

## 2012-02-26 DIAGNOSIS — R10816 Epigastric abdominal tenderness: Secondary | ICD-10-CM | POA: Insufficient documentation

## 2012-02-26 DIAGNOSIS — Z9889 Other specified postprocedural states: Secondary | ICD-10-CM | POA: Insufficient documentation

## 2012-02-26 DIAGNOSIS — F17200 Nicotine dependence, unspecified, uncomplicated: Secondary | ICD-10-CM | POA: Insufficient documentation

## 2012-02-26 DIAGNOSIS — R112 Nausea with vomiting, unspecified: Secondary | ICD-10-CM | POA: Insufficient documentation

## 2012-02-26 HISTORY — DX: Thrombotic microangiopathy: M31.1

## 2012-02-26 HISTORY — DX: Other thrombotic microangiopathy: M31.19

## 2012-02-26 LAB — URINALYSIS, ROUTINE W REFLEX MICROSCOPIC
Bilirubin Urine: NEGATIVE
Leukocytes, UA: NEGATIVE
Nitrite: NEGATIVE
Specific Gravity, Urine: 1.027 (ref 1.005–1.030)
Urobilinogen, UA: 1 mg/dL (ref 0.0–1.0)

## 2012-02-26 LAB — DIFFERENTIAL
Eosinophils Relative: 0 % (ref 0–5)
Lymphocytes Relative: 8 % — ABNORMAL LOW (ref 12–46)
Lymphs Abs: 0.9 10*3/uL (ref 0.7–4.0)
Monocytes Absolute: 0.5 10*3/uL (ref 0.1–1.0)
Monocytes Relative: 4 % (ref 3–12)

## 2012-02-26 LAB — CBC
HCT: 36.7 % (ref 36.0–46.0)
Hemoglobin: 12.5 g/dL (ref 12.0–15.0)
MCH: 30.3 pg (ref 26.0–34.0)
MCV: 89.1 fL (ref 78.0–100.0)
Platelets: 217 10*3/uL (ref 150–400)
RBC: 4.12 MIL/uL (ref 3.87–5.11)
WBC: 11.4 10*3/uL — ABNORMAL HIGH (ref 4.0–10.5)

## 2012-02-26 LAB — PREGNANCY, URINE: Preg Test, Ur: NEGATIVE

## 2012-02-26 LAB — COMPREHENSIVE METABOLIC PANEL
ALT: 32 U/L (ref 0–35)
Calcium: 9.3 mg/dL (ref 8.4–10.5)
GFR calc Af Amer: >90 mL/min (ref >90)
Glucose, Bld: 101 mg/dL — ABNORMAL HIGH (ref 70–99)
Sodium: 137 mEq/L (ref 135–145)
Total Protein: 7.7 g/dL (ref 6.0–8.3)

## 2012-02-26 LAB — URINE MICROSCOPIC-ADD ON

## 2012-02-26 MED ORDER — ONDANSETRON HCL 4 MG/2ML IJ SOLN
4.0000 mg | Freq: Once | INTRAMUSCULAR | Status: AC
  Administered 2012-02-26: 4 mg via INTRAVENOUS
  Filled 2012-02-26: qty 2

## 2012-02-26 MED ORDER — FAMOTIDINE IN NACL 20-0.9 MG/50ML-% IV SOLN
20.0000 mg | Freq: Once | INTRAVENOUS | Status: AC
  Administered 2012-02-26: 20 mg via INTRAVENOUS
  Filled 2012-02-26: qty 50

## 2012-02-26 MED ORDER — SODIUM CHLORIDE 0.9 % IV BOLUS (SEPSIS)
1000.0000 mL | Freq: Once | INTRAVENOUS | Status: AC
  Administered 2012-02-26: 1000 mL via INTRAVENOUS

## 2012-02-26 MED ORDER — MORPHINE SULFATE 4 MG/ML IJ SOLN
4.0000 mg | Freq: Once | INTRAMUSCULAR | Status: AC
  Administered 2012-02-26: 4 mg via INTRAVENOUS
  Filled 2012-02-26: qty 1

## 2012-02-26 MED ORDER — PROMETHAZINE HCL 25 MG/ML IJ SOLN
12.5000 mg | Freq: Once | INTRAMUSCULAR | Status: AC
  Administered 2012-02-26: 12.5 mg via INTRAVENOUS
  Filled 2012-02-26: qty 1

## 2012-02-26 NOTE — ED Provider Notes (Signed)
History    This chart was scribed for Cherrish Vitali A. Patrica Duel, MD, MD by Smitty Pluck. The patient was seen in room MH04 and the patient's care was started at 7:21PM.   CSN: [id number]  Arrival date & time 02/26/12  1738   First MD Initiated Contact with Patient 02/26/12 1919      Chief Complaint  Patient presents with  . Emesis    (Consider location/radiation/quality/duration/timing/severity/associated sxs/prior treatment) Patient is a 26 y.o. female presenting with vomiting. The history is provided by the patient.  Emesis    Krista Maddox is a 26 y.o. female who presents to the Emergency Department complaining of persistent emesis and nausea onset today 6:30am. Pt denies diarrhea. Pt reports vomiting all day. Pt denies sick contact and recent travel. She did not eat anything out of the ordinary. She reports being dizzy when she stands and sits up. Pt reports having stabbing upper abdominal pain. The symptoms have been constant since onset without radiation.   Past Medical History  Diagnosis Date  . TTP (thrombotic thrombocytopenic purpura)     Past Surgical History  Procedure Date  . Cholecystectomy   . Cesarean section     History reviewed. No pertinent family history.  History  Substance Use Topics  . Smoking status: Current Everyday Smoker  . Smokeless tobacco: Not on file  . Alcohol Use: No    OB History    Grav Para Term Preterm Abortions TAB SAB Ect Mult Living                  Review of Systems  Gastrointestinal: Positive for vomiting.  All other systems reviewed and are negative.   10 Systems reviewed and are negative for acute change except as noted in the HPI.  Allergies  Review of patient's allergies indicates no known allergies.  Home Medications   Current Outpatient Rx  Name Route Sig Dispense Refill  . HYDROCODONE-ACETAMINOPHEN 5-500 MG PO TABS Oral Take 1 tablet by mouth every 6 (six) hours as needed. Patient used this medication for pain.     Marland Kitchen ALEVE PO Oral Take 1 capsule by mouth daily as needed. Patient used this medication for a headache.      BP 125/68  Pulse 108  Temp(Src) 99.4 F (37.4 C) (Oral)  Resp 20  Ht 5\' 5"  (1.651 m)  Wt 195 lb (88.451 kg)  BMI 32.45 kg/m2  SpO2 100%  LMP 02/21/2012  Physical Exam  Nursing note and vitals reviewed. Constitutional: She appears well-developed and well-nourished. No distress.  HENT:  Head: Normocephalic and atraumatic.  Eyes: Conjunctivae are normal. Pupils are equal, round, and reactive to light.  Neck: Normal range of motion. Neck supple.  Cardiovascular: Normal rate, regular rhythm and normal heart sounds.   Pulmonary/Chest: Effort normal and breath sounds normal. No respiratory distress.  Abdominal: Soft. She exhibits no distension. There is tenderness (mild with deep palpation in epigastric ).  Skin: Skin is warm and dry.  Psychiatric: She has a normal mood and affect. Her behavior is normal.    ED Course  Procedures (including critical care time) DIAGNOSTIC STUDIES: Oxygen Saturation is 100% on room air, normal by my interpretation.    COORDINATION OF CARE: 7:25PM EDP discusses pt ED treatment course with pt.  7:29PM EDP ordered medication: Zofran 4 mg, NaCl 0.9% bolus, Pepcid 20 mg    Labs Reviewed  URINALYSIS, ROUTINE W REFLEX MICROSCOPIC - Abnormal; Notable for the following:    APPearance CLOUDY (*)  Hgb urine dipstick SMALL (*)    All other components within normal limits  COMPREHENSIVE METABOLIC PANEL - Abnormal; Notable for the following:    Glucose, Bld 101 (*)    All other components within normal limits  URINE MICROSCOPIC-ADD ON - Abnormal; Notable for the following:    Squamous Epithelial / LPF FEW (*)    Bacteria, UA MANY (*)    All other components within normal limits  PREGNANCY, URINE  LIPASE, BLOOD  CBC  DIFFERENTIAL   No results found.   No diagnosis found.    MDM  Pt is seen and examined;  Initial history and physical  completed.  Will follow.   I personally performed the services described in this documentation, which was scribed in my presence.  The recorded information has been reviewed and considered. Krista Adolph A. Patrica Duel, MD.  @ATTPRO @     Patient continues to have nausea, vomiting, and abdominal pain. Have ordered his additional medications and fluids and will need a CAT scan for further evaluation. Will be signed out to the overhead position in stable condition.      Krista Fortin A. Patrica Duel, MD 02/26/12 2307

## 2012-02-26 NOTE — ED Notes (Signed)
Pt states she has been vomiting all day, chills abd pain. "Feel weak"

## 2012-02-26 NOTE — ED Notes (Signed)
Given ice chips.

## 2012-02-27 DIAGNOSIS — R599 Enlarged lymph nodes, unspecified: Secondary | ICD-10-CM

## 2012-02-27 DIAGNOSIS — R1110 Vomiting, unspecified: Secondary | ICD-10-CM

## 2012-02-27 DIAGNOSIS — R109 Unspecified abdominal pain: Secondary | ICD-10-CM

## 2012-02-27 MED ORDER — OXYCODONE-ACETAMINOPHEN 5-325 MG PO TABS: 2.0000 | ORAL_TABLET | Freq: Four times a day (QID) | ORAL | Status: AC | PRN

## 2012-02-27 MED ORDER — IOHEXOL 300 MG/ML  SOLN
100.0000 mL | Freq: Once | INTRAMUSCULAR | Status: AC | PRN
  Administered 2012-02-27: 100 mL via INTRAVENOUS

## 2012-02-27 MED ORDER — ONDANSETRON 8 MG PO TBDP: 8.0000 mg | ORAL_TABLET | Freq: Three times a day (TID) | ORAL | Status: AC | PRN

## 2012-02-27 MED ORDER — IOHEXOL 300 MG/ML  SOLN: 40.0000 mL | Freq: Once | INTRAMUSCULAR | Status: AC | PRN

## 2012-02-27 NOTE — ED Provider Notes (Signed)
Patient remained stable. She was feeling better and able to tolerate oral intake. CT results did mention nonspecific lymphadenopathy that has been present since 2009 but increased in size. No acute surgical process was identified. I discussed the scan with the radiologist. There is a very broad differential for this patient's lymphadenopathy. Patient had slight leukocytosis but no other significant laboratory findings. She will need to followup with a primary care provider. She is aware that lymphoma is in the differential of possible causes of her symptoms. Patient was told that she will likely need repeat blood work and a future CAT scan. She was comfortable with plan for discharge home and was discharged with a prescription for pain and nausea medication.  1. Abdominal  pain, other specified site   2. Nausea and vomiting     Cyndra Numbers, MD 02/27/12 [phone]

## 2012-02-27 NOTE — Discharge Instructions (Signed)
Abdominal Pain  Abdominal pain can be caused by many things. Your caregiver decides the seriousness of your pain by an examination and possibly blood tests and X-rays. Many cases can be observed and treated at home. Most abdominal pain is not caused by a disease and will probably improve without treatment. However, in many cases, more time must pass before a clear cause of the pain can be found. Before that point, it may not be known if you need more testing, or if hospitalization or surgery is needed.  HOME CARE INSTRUCTIONS    Do not take laxatives unless directed by your caregiver.   Take pain medicine only as directed by your caregiver.   Only take over-the-counter or prescription medicines for pain, discomfort, or fever as directed by your caregiver.   Try a clear liquid diet (broth, tea, or water) for as long as directed by your caregiver. Slowly move to a bland diet as tolerated.  SEEK IMMEDIATE MEDICAL CARE IF:    The pain does not go away.   You have a fever.   You keep throwing up (vomiting).   The pain is felt only in portions of the abdomen. Pain in the right side could possibly be appendicitis. In an adult, pain in the left lower portion of the abdomen could be colitis or diverticulitis.   You pass bloody or black tarry stools.  MAKE SURE YOU:    Understand these instructions.   Will watch your condition.   Will get help right away if you are not doing well or get worse.  Document Released: 09/08/2005 Document Revised: 11/18/2011 Document Reviewed: 07/17/2008  [employer] Patient Information 2012 [employer].    Nausea and Vomiting  Nausea is a sick feeling that often comes before throwing up (vomiting). Vomiting is a reflex where stomach contents come out of your mouth. Vomiting can cause severe loss of body fluids (dehydration). Children and elderly adults can become dehydrated quickly, especially if they also have diarrhea. Nausea and vomiting are symptoms of a condition or disease. It  is important to find the cause of your symptoms.  CAUSES    Direct irritation of the stomach lining. This irritation can result from increased acid production (gastroesophageal reflux disease), infection, food poisoning, taking certain medicines (such as nonsteroidal anti-inflammatory drugs), alcohol use, or tobacco use.   Signals from the brain.These signals could be caused by a headache, heat exposure, an inner ear disturbance, increased pressure in the brain from injury, infection, a tumor, or a concussion, pain, emotional stimulus, or metabolic problems.   An obstruction in the gastrointestinal tract (bowel obstruction).   Illnesses such as diabetes, hepatitis, gallbladder problems, appendicitis, kidney problems, cancer, sepsis, atypical symptoms of a heart attack, or eating disorders.   Medical treatments such as chemotherapy and radiation.   Receiving medicine that makes you sleep (general anesthetic) during surgery.  DIAGNOSIS  Your caregiver may ask for tests to be done if the problems do not improve after a few days. Tests may also be done if symptoms are severe or if the reason for the nausea and vomiting is not clear. Tests may include:   Urine tests.   Blood tests.   Stool tests.   Cultures (to look for evidence of infection).   X-rays or other imaging studies.  Test results can help your caregiver make decisions about treatment or the need for additional tests.  TREATMENT  You need to stay well hydrated. Drink frequently but in small amounts.You may wish to   hydrated.When you eat, eating slowly may help prevent nausea.There are also some antinausea medicines that may help prevent nausea. HOME CARE INSTRUCTIONS   Take all medicine as directed by your caregiver.   If you do not have an appetite, do not force yourself to eat. However, you must continue to drink fluids.     If you have an appetite, eat a normal diet unless your caregiver tells you differently.   Eat a variety of complex carbohydrates (rice, wheat, potatoes, bread), lean meats, yogurt, fruits, and vegetables.   Avoid high-fat foods because they are more difficult to digest.   Drink enough water and fluids to keep your urine clear or pale yellow.   If you are dehydrated, ask your caregiver for specific rehydration instructions. Signs of dehydration may include:   Severe thirst.   Dry lips and mouth.   Dizziness.   Dark urine.   Decreasing urine frequency and amount.   Confusion.   Rapid breathing or pulse.  SEEK IMMEDIATE MEDICAL CARE IF:   You have blood or brown flecks (like coffee grounds) in your vomit.   You have black or bloody stools.   You have a severe headache or stiff neck.   You are confused.   You have severe abdominal pain.   You have chest pain or trouble breathing.   You do not urinate at least once every 8 hours.   You develop cold or clammy skin.   You continue to vomit for longer than 24 to 48 hours.   You have a fever.  MAKE SURE YOU:   Understand these instructions.   Will watch your condition.   Will get help right away if you are not doing well or get worse.  Document Released: 11/29/2005 Document Revised: 11/18/2011 Document Reviewed: 04/28/2011 [facility] Patient Information 2012 [place], [place].Nausea and Vomiting Nausea is a sick feeling that often comes before throwing up (vomiting). Vomiting is a reflex where stomach contents come out of your mouth. Vomiting can cause severe loss of body fluids (dehydration). Children and elderly adults can become dehydrated quickly, especially if they also have diarrhea. Nausea and vomiting are symptoms of a condition or disease. It is important to find the cause of your symptoms. CAUSES   Direct irritation of the stomach lining. This irritation can result from increased acid production  (gastroesophageal reflux disease), infection, food poisoning, taking certain medicines (such as nonsteroidal anti-inflammatory drugs), alcohol use, or tobacco use.   Signals from the brain.These signals could be caused by a headache, heat exposure, an inner ear disturbance, increased pressure in the brain from injury, infection, a tumor, or a concussion, pain, emotional stimulus, or metabolic problems.   An obstruction in the gastrointestinal tract (bowel obstruction).   Illnesses such as diabetes, hepatitis, gallbladder problems, appendicitis, kidney problems, cancer, sepsis, atypical symptoms of a heart attack, or eating disorders.   Medical treatments such as chemotherapy and radiation.   Receiving medicine that makes you sleep (general anesthetic) during surgery.  DIAGNOSIS Your caregiver may ask for tests to be done if the problems do not improve after a few days. Tests may also be done if symptoms are severe or if the reason for the nausea and vomiting is not clear. Tests may include:  Urine tests.   Blood tests.   Stool tests.   Cultures (to look for evidence of infection).   X-rays or other imaging studies.  Test results can help your caregiver make decisions about treatment or the need for additional  tests. TREATMENT You need to stay well hydrated. Drink frequently but in small amounts.You may wish to drink water, sports drinks, clear broth, or eat frozen ice pops or gelatin dessert to help stay hydrated.When you eat, eating slowly may help prevent nausea.There are also some antinausea medicines that may help prevent nausea. HOME CARE INSTRUCTIONS   Take all medicine as directed by your caregiver.   If you do not have an appetite, do not force yourself to eat. However, you must continue to drink fluids.   If you have an appetite, eat a normal diet unless your caregiver tells you differently.   Eat a variety of complex carbohydrates (rice, wheat, potatoes, bread), lean  meats, yogurt, fruits, and vegetables.   Avoid high-fat foods because they are more difficult to digest.   Drink enough water and fluids to keep your urine clear or pale yellow.   If you are dehydrated, ask your caregiver for specific rehydration instructions. Signs of dehydration may include:   Severe thirst.   Dry lips and mouth.   Dizziness.   Dark urine.   Decreasing urine frequency and amount.   Confusion.   Rapid breathing or pulse.  SEEK IMMEDIATE MEDICAL CARE IF:   You have blood or brown flecks (like coffee grounds) in your vomit.   You have black or bloody stools.   You have a severe headache or stiff neck.   You are confused.   You have severe abdominal pain.   You have chest pain or trouble breathing.   You do not urinate at least once every 8 hours.   You develop cold or clammy skin.   You continue to vomit for longer than 24 to 48 hours.   You have a fever.  MAKE SURE YOU:   Understand these instructions.   Will watch your condition.   Will get help right away if you are not doing well or get worse.  Document Released: 11/29/2005 Document Revised: 11/18/2011 Document Reviewed: 04/28/2011 [facility] Patient Information 2012 [place], [place].

## 2012-03-12 ENCOUNTER — Emergency Department (HOSPITAL_COMMUNITY)
Admission: EM | Admit: 2012-03-12 | Discharge: 2012-03-13 | Disposition: A | Discharge status: Home / Self Care | Payer: Medicaid Other | Attending: Emergency Medicine | Admitting: Emergency Medicine

## 2012-03-12 DIAGNOSIS — R1013 Epigastric pain: Secondary | ICD-10-CM | POA: Insufficient documentation

## 2012-03-12 DIAGNOSIS — K2900 Acute gastritis without bleeding: Secondary | ICD-10-CM | POA: Insufficient documentation

## 2012-03-12 DIAGNOSIS — Z79899 Other long term (current) drug therapy: Secondary | ICD-10-CM | POA: Insufficient documentation

## 2012-03-12 DIAGNOSIS — Z9889 Other specified postprocedural states: Secondary | ICD-10-CM | POA: Insufficient documentation

## 2012-03-12 DIAGNOSIS — F17200 Nicotine dependence, unspecified, uncomplicated: Secondary | ICD-10-CM | POA: Insufficient documentation

## 2012-03-12 DIAGNOSIS — R10816 Epigastric abdominal tenderness: Secondary | ICD-10-CM | POA: Insufficient documentation

## 2012-03-13 LAB — CBC
HCT: 39.2 % (ref 36.0–46.0)
Hemoglobin: 13.2 g/dL (ref 12.0–15.0)
MCH: 30.3 pg (ref 26.0–34.0)
RBC: 4.35 MIL/uL (ref 3.87–5.11)

## 2012-03-13 LAB — COMPREHENSIVE METABOLIC PANEL
Alkaline Phosphatase: 76 U/L (ref 39–117)
BUN: 12 mg/dL (ref 6–23)
Chloride: 102 mEq/L (ref 96–112)
GFR calc Af Amer: >90 mL/min (ref >90)
GFR calc non Af Amer: >90 mL/min (ref >90)
Glucose, Bld: 87 mg/dL (ref 70–99)
Potassium: 4.1 mEq/L (ref 3.5–5.1)
Total Bilirubin: 0.2 mg/dL — ABNORMAL LOW (ref 0.3–1.2)

## 2012-03-13 LAB — URINALYSIS, ROUTINE W REFLEX MICROSCOPIC
Bilirubin Urine: NEGATIVE
Glucose, UA: NEGATIVE mg/dL
Leukocytes, UA: NEGATIVE
Nitrite: NEGATIVE
Specific Gravity, Urine: 1.028 (ref 1.005–1.030)
pH: 6 (ref 5.0–8.0)

## 2012-03-13 LAB — DIFFERENTIAL
Lymphs Abs: 2.8 10*3/uL (ref 0.7–4.0)
Monocytes Relative: 7 % (ref 3–12)
Neutro Abs: 6 10*3/uL (ref 1.7–7.7)
Neutrophils Relative %: 62 % (ref 43–77)

## 2012-03-13 LAB — PREGNANCY, URINE: Preg Test, Ur: NEGATIVE

## 2012-03-13 MED ORDER — GI COCKTAIL ~~LOC~~
30.0000 mL | Freq: Once | ORAL | Status: AC
  Administered 2012-03-13: 30 mL via ORAL
  Filled 2012-03-13: qty 30

## 2012-03-13 MED ORDER — FAMOTIDINE 20 MG PO TABS: 20.0000 mg | ORAL_TABLET | Freq: Two times a day (BID) | ORAL | Status: DC

## 2012-03-13 MED ORDER — PANTOPRAZOLE SODIUM 40 MG IV SOLR
40.0000 mg | Freq: Once | INTRAVENOUS | Status: AC
  Administered 2012-03-13: 40 mg via INTRAVENOUS
  Filled 2012-03-13: qty 40

## 2012-03-13 MED ORDER — SODIUM CHLORIDE 0.9 % IV SOLN
Freq: Once | INTRAVENOUS | Status: AC
  Administered 2012-03-13: 01:00:00 via INTRAVENOUS

## 2012-03-13 NOTE — ED Notes (Signed)
Pt presents to ED with constant abdominal pain that has progressed this a.m. Denies N/V/D.

## 2012-03-13 NOTE — ED Notes (Signed)
Pt. Stated that medications helped a little when asked.

## 2012-03-13 NOTE — Discharge Instructions (Signed)
Gastritis  Gastritis is an irritation of the stomach. It can be caused by anything that bothers the stomach. Some irritants include:   Alcohol.    Caffeine.    Nicotine.    Spicy, acidic, greasy, and fried foods.    Medicines for pain and arthritis.    Emotional distress.   HOME CARE     Only take medicine as told by your doctor.    Take small sips of clear liquids often. Do not drink large amounts of liquids at one time.    If you have watery poop (diarrhea), avoid milk, fruit, tobacco, alcohol, really hot or cold liquids, or too much of anything at one time.    Rest.    Wash your hands often.    Once you can keep clear liquids down, start soups, juices, apple sauce, crackers, and sherbet. Slowly add plain, not spicy, foods to your diet.   GET HELP RIGHT AWAY IF:     You cannot keep fluids down.    You cannot stop throwing up (vomiting) or you throw up blood.    You have more stomach or chest pain.    You have a temperature by mouth above 102 F (38.9 C), not controlled by medicine.    You pass out (faint), feel lightheaded, or are more thirsty than normal.    You have bloody or black poop (stools).    Your watery poop will not go away.    You are not improving or are getting worse.   MAKE SURE YOU:     Understand these instructions.    Will watch your condition.    Will get help right away if you are not doing well or get worse.   Document Released: 05/17/2008 Document Revised: 11/18/2011 Document Reviewed: 10/17/2009  ExitCare Patient Information 2012 [employer].

## 2012-03-13 NOTE — ED Provider Notes (Signed)
History     CSN: [id number]  Arrival date & time 03/12/12  2323   First MD Initiated Contact with Patient 03/13/12 0047      Chief Complaint  Patient presents with  . Abdominal Pain    upper abdominal pain,  hurt for 2 to 3 weeks,  seen at [facility] ER,, did not follow up wiith doctor "due to no insurance, waiting on medicaid"    (Consider location/radiation/quality/duration/timing/severity/associated sxs/prior treatment) HPI Comments: Patient was 5-6 day history of epigastric pain, dull in nature, not made worse or better with food.  She has a history of had a cholecystectomy in the past.  Tonight.  The pain has become more persistent and sharp in nature.  Does not radiate.  She tried taking Vicodin without relief.  She is also tried [place] without relief.  She denies history of gastric reflux.  [facility] for similar abdominal pain with a normal CT scan  Patient is a 26 y.o. female presenting with abdominal pain. The history is provided by the patient.  Abdominal Pain The primary symptoms of the illness include abdominal pain. The primary symptoms of the illness do not include fever, shortness of breath, nausea, vomiting, diarrhea or dysuria. The current episode started more than 2 days ago. The onset of the illness was gradual. The problem has been gradually worsening.  The patient states that she believes she is currently not pregnant. The patient has not had a change in bowel habit. Symptoms associated with the illness do not include chills or constipation.    Past Medical History  Diagnosis Date  . TTP (thrombotic thrombocytopenic purpura)     Past Surgical History  Procedure Date  . Cholecystectomy   . Cesarean section     No family history on file.  History  Substance Use Topics  . Smoking status: Current Everyday Smoker  . Smokeless tobacco: Not on file  . Alcohol Use: No    OB History    Grav Para Term Preterm Abortions TAB  SAB Ect Mult Living                  Review of Systems  Constitutional: Negative for fever and chills.  Respiratory: Negative for shortness of breath.   Gastrointestinal: Positive for abdominal pain. Negative for nausea, vomiting, diarrhea and constipation.  Genitourinary: Negative for dysuria.  Neurological: Negative for headaches.    Allergies  Review of patient's allergies indicates no known allergies.  Home Medications   Current Outpatient Rx  Name Route Sig Dispense Refill  . HYDROCODONE-ACETAMINOPHEN 5-500 MG PO TABS Oral Take 1 tablet by mouth every 6 (six) hours as needed. Patient used this medication for pain.    Marland Kitchen LORATADINE 10 MG PO TABS Oral Take 10 mg by mouth daily.    Marland Kitchen FAMOTIDINE 20 MG PO TABS Oral Take 1 tablet (20 mg total) by mouth 2 (two) times daily. 30 tablet 0    BP 123/70  Pulse 91  Temp(Src) 98.8 F (37.1 C) (Oral)  Resp 20  SpO2 100%  LMP 02/21/2012  Physical Exam  Constitutional: She is oriented to person, place, and time. She appears well-developed and well-nourished.  Eyes: Pupils are equal, round, and reactive to light.  Neck: Normal range of motion.  Cardiovascular: Normal rate.   Abdominal: Soft. Bowel sounds are normal. She exhibits no distension. There is tenderness in the epigastric area.  Musculoskeletal: Normal range of motion.  Neurological: She  is alert and oriented to person, place, and time.  Skin: Skin is warm and dry.    ED Course  Procedures (including critical care time)  Labs Reviewed  COMPREHENSIVE METABOLIC PANEL - Abnormal; Notable for the following:    Total Bilirubin 0.2 (*)    All other components within normal limits  PREGNANCY, URINE  URINALYSIS, ROUTINE W REFLEX MICROSCOPIC  CBC  DIFFERENTIAL   No results found.   1. Gastritis, acute     Patient awakened from sleep.  No longer having discomfort  MDM  Gastritis most likely         Arman Filter, NP 03/13/12 [phone]

## 2012-03-13 NOTE — ED Provider Notes (Signed)
Medical screening examination/treatment/procedure(s) were performed by non-physician practitioner and as supervising physician I was immediately available for consultation/collaboration.   Adamary Savary, MD 03/13/12 0738 

## 2012-11-20 ENCOUNTER — Encounter (HOSPITAL_COMMUNITY): Payer: Self-pay | Admitting: *Deleted

## 2012-11-20 ENCOUNTER — Emergency Department (HOSPITAL_COMMUNITY): Payer: Self-pay

## 2012-11-20 ENCOUNTER — Emergency Department (HOSPITAL_COMMUNITY)
Admission: EM | Admit: 2012-11-20 | Discharge: 2012-11-21 | Disposition: A | Discharge status: Home / Self Care | Payer: Self-pay | Attending: Emergency Medicine | Admitting: Emergency Medicine

## 2012-11-20 DIAGNOSIS — R51 Headache: Secondary | ICD-10-CM | POA: Insufficient documentation

## 2012-11-20 DIAGNOSIS — R42 Dizziness and giddiness: Secondary | ICD-10-CM | POA: Insufficient documentation

## 2012-11-20 DIAGNOSIS — F17200 Nicotine dependence, unspecified, uncomplicated: Secondary | ICD-10-CM | POA: Insufficient documentation

## 2012-11-20 DIAGNOSIS — Z79899 Other long term (current) drug therapy: Secondary | ICD-10-CM | POA: Insufficient documentation

## 2012-11-20 DIAGNOSIS — M311 Thrombotic microangiopathy, unspecified: Secondary | ICD-10-CM | POA: Insufficient documentation

## 2012-11-20 MED ORDER — KETOROLAC TROMETHAMINE 60 MG/2ML IM SOLN
60.0000 mg | Freq: Once | INTRAMUSCULAR | Status: AC
  Administered 2012-11-21: 60 mg via INTRAMUSCULAR
  Filled 2012-11-20: qty 2

## 2012-11-20 MED ORDER — METOCLOPRAMIDE HCL 5 MG/ML IJ SOLN
10.0000 mg | Freq: Once | INTRAMUSCULAR | Status: AC
  Administered 2012-11-20: 10 mg via INTRAMUSCULAR
  Filled 2012-11-20: qty 2

## 2012-11-20 NOTE — ED Notes (Signed)
Pt c/o headache x 3 wks; no other complaints

## 2012-11-20 NOTE — ED Provider Notes (Signed)
History     CSN: [id number]  Arrival date & time 11/20/12  7829   First MD Initiated Contact with Patient 11/20/12 2217      Chief Complaint  Patient presents with  . Headache    (Consider location/radiation/quality/duration/timing/severity/associated sxs/prior treatment) HPI History provided by pt.   Pt presents w/ 3 weeks constant, throbbing, frontal headache.  No relief w/ aleve, tylenol and ibuprofen. Associated w/ intermittent lightheadedness for the past two days.  Denies fever, N/V, dizziness, blurred vision, dysarthria, dysphagia, confusion, extremity weakness/paresthesias and ataxia.   Denies trauma.  No prior h/o headaches.   Past Medical History  Diagnosis Date  . TTP (thrombotic thrombocytopenic purpura)     Past Surgical History  Procedure Date  . Cholecystectomy   . Cesarean section     No family history on file.  History  Substance Use Topics  . Smoking status: Current Every Day Smoker  . Smokeless tobacco: Not on file  . Alcohol Use: No    OB History    Grav Para Term Preterm Abortions TAB SAB Ect Mult Living                  Review of Systems  All other systems reviewed and are negative.    Allergies  Review of patient's allergies indicates no known allergies.  Home Medications   Current Outpatient Rx  Name  Route  Sig  Dispense  Refill  . ACETAMINOPHEN 500 MG PO TABS   Oral   Take 1,000 mg by mouth every 6 (six) hours as needed. For pain.         . IBUPROFEN 200 MG PO TABS   Oral   Take 400 mg by mouth every 8 (eight) hours as needed. For pain.         Marland Kitchen NAPROXEN SODIUM 220 MG PO TABS   Oral   Take 440 mg by mouth 2 (two) times daily as needed. For pain.           BP 120/75  Pulse 90  Temp 98.4 F (36.9 C)  Resp 20  SpO2 97%  LMP 10/25/2012  Physical Exam  Nursing note and vitals reviewed. Constitutional: She is oriented to person, place, and time. She appears well-developed and well-nourished.  HENT:  Head:  Normocephalic and atraumatic.       No tenderness of sinuses or temples.   Eyes:       Normal appearance  Neck: Normal range of motion. Neck supple. No rigidity. No Brudzinski's sign and no Kernig's sign noted.  Cardiovascular: Normal rate, regular rhythm and intact distal pulses.   Pulmonary/Chest: Effort normal and breath sounds normal.  Musculoskeletal: Normal range of motion.  Neurological: She is alert and oriented to person, place, and time. No sensory deficit. Coordination normal.       CN 3-12 intact.  No nystagmus.  5/5 and equal upper and lower extremity strength.  No past pointing.    Skin: Skin is warm and dry. No rash noted.  Psychiatric: She has a normal mood and affect. Her behavior is normal.    ED Course  Procedures (including critical care time)  Labs Reviewed - No data to display Ct Head Wo Contrast  11/20/2012  *RADIOLOGY REPORT*  Clinical Data: Headache.  CT HEAD WITHOUT CONTRAST  Technique:  Contiguous axial images were obtained from the base of the skull through the vertex without contrast.  Comparison: 03/20/2010.  Findings: No mass lesion, mass effect, midline shift, hydrocephalus, hemorrhage.  No territorial ischemia or acute infarction.  Frontal sinuses clear.  Ethmoid air cells clear. Sphenoid sinuses clear.  Mastoid air cells within normal limits.  IMPRESSION: Negative.   Original Report Authenticated By: Andreas Newport, M.D.      1. Headache       MDM  1915 F presents w/ 3 weeks non-traumatic headache.  No prior history.  On exam, pt well-appearing, afebrile, no focal neuro deficits or meningeal signs.  CT head pending. Pt to receive IM reglan for pain.  11:00 PM   CT neg.  Results discussed w/ pt.  She reports mild improvement in pain w/ IM reglan.  Will try IM toradol and d/c home w/ referral to neuro.  Return precautions discussed. 12:14 AM   Pain resolved.  1:05 AM       Otilio Miu, PA-C 11/21/12 0105

## 2012-11-21 NOTE — ED Provider Notes (Signed)
Medical screening examination/treatment/procedure(s) were performed by non-physician practitioner and as supervising physician I was immediately available for consultation/collaboration.   Finch Costanzo M Rollie Hynek, MD 11/21/12 0820 

## 2013-08-27 ENCOUNTER — Ambulatory Visit (INDEPENDENT_AMBULATORY_CARE_PROVIDER_SITE_OTHER): Payer: PRIVATE HEALTH INSURANCE | Admitting: Emergency Medicine

## 2013-08-27 VITALS — BP 100/64 | HR 104 | Temp 98.9°F | Resp 18 | Ht 66.0 in | Wt 204.2 lb

## 2013-08-27 DIAGNOSIS — J018 Other acute sinusitis: Secondary | ICD-10-CM

## 2013-08-27 DIAGNOSIS — J209 Acute bronchitis, unspecified: Secondary | ICD-10-CM

## 2013-08-27 MED ORDER — AMOXICILLIN-POT CLAVULANATE 875-125 MG PO TABS: 1.0000 | ORAL_TABLET | Freq: Two times a day (BID) | ORAL | Status: DC

## 2013-08-27 MED ORDER — PSEUDOEPHEDRINE-GUAIFENESIN ER 60-600 MG PO TB12: 1.0000 | ORAL_TABLET | Freq: Two times a day (BID) | ORAL | Status: AC

## 2013-08-27 MED ORDER — HYDROCOD POLST-CHLORPHEN POLST 10-8 MG/5ML PO LQCR: 5.0000 mL | Freq: Two times a day (BID) | ORAL | Status: DC | PRN

## 2013-08-27 NOTE — Progress Notes (Signed)
[facility] [address] [place] [address] [phone]  Date:  08/27/2013   Name:  Krista Maddox   DOB:  1986/01/26   MRN:  [id number]  PCP:  Lehman Prom, NP    Chief Complaint: URI   History of Present Illness:  Krista Maddox is a 27 y.o. very pleasant female patient who presents with the following:  Ill since last night with fever and chills.  Has nasal congestion and post nasal drainage purulent in color.   Has cough with some scant wheezing. Has purulent sputum.  No nausea or vomiting.  Slept all day and left work early last night due illness.  No improvement with over the counter medications or other home remedies. Denies other complaint or health concern today.   Patient Active Problem List   Diagnosis Date Noted  . DENTAL PAIN 06/09/2010  . HEADACHE, TENSION 02/04/2010  . SCABIES 01/13/2010  . FOOT PAIN, LEFT 01/13/2010  . CERUMEN IMPACTION, BILATERAL 11/24/2009  . PSORIASIS 11/24/2009  . TOBACCO ABUSE 10/15/2009  . 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 PYELONEPHRITIS 10/01/2009  . THROMBOTIC THROMBOCYTOPENIC PURPURA 12/13/2005    Past Medical History  Diagnosis Date  . TTP (thrombotic thrombocytopenic purpura)     Past Surgical History  Procedure Laterality Date  . Cholecystectomy    . Cesarean section      History  Substance Use Topics  . Smoking status: Current Every Day Smoker  . Smokeless tobacco: Never Used  . Alcohol Use: No    No family history on file.  No Known Allergies  Medication list has been reviewed and updated.  Current Outpatient Prescriptions on File Prior to Visit  Medication Sig Dispense Refill  . acetaminophen (TYLENOL) 500 MG tablet Take 1,000 mg by mouth every 6 (six) hours as needed. For pain.      Marland Kitchen ibuprofen (ADVIL,MOTRIN) 200 MG tablet Take 400 mg by mouth every 8 (eight) hours as needed. For pain.      . naproxen sodium (ANAPROX) 220 MG tablet Take 440 mg by mouth 2 (two) times  daily as needed. For pain.       No current facility-administered medications on file prior to visit.    Review of Systems:  As per HPI, otherwise negative.    Physical Examination: Filed Vitals:   08/27/13 1841  BP: 100/64  Pulse: 104  Temp: 98.9 F (37.2 C)  Resp: 18   Filed Vitals:   08/27/13 1841  Height: 5\' 6"  (1.676 m)  Weight: 204 lb 3.2 oz (92.625 kg)   Body mass index is 32.97 kg/(m^2). Ideal Body Weight: Weight in (lb) to have BMI = 25: 154.6  GEN: WDWN, NAD, Non-toxic, A & O x 3 HEENT: Atraumatic, Normocephalic. Neck supple. No masses, No LAD. Ears and Nose: No external deformity. CV: RRR, No M/G/R. No JVD. No thrill. No extra heart sounds. PULM: CTA B, no wheezes, crackles, rhonchi. No retractions. No resp. distress. No accessory muscle use. ABD: S, NT, ND, +BS. No rebound. No HSM. EXTR: No c/c/e NEURO Normal gait.  PSYCH: Normally interactive. Conversant. Not depressed or anxious appearing.  Calm demeanor.    Assessment and Plan: Sinusitis Bronchitis mucinex tussionex augmentin  Signed,  Phillips Odor, MD

## 2013-08-27 NOTE — Patient Instructions (Signed)

## 2015-01-10 DIAGNOSIS — Y998 Other external cause status: Secondary | ICD-10-CM | POA: Insufficient documentation

## 2015-01-10 DIAGNOSIS — Y9289 Other specified places as the place of occurrence of the external cause: Secondary | ICD-10-CM | POA: Insufficient documentation

## 2015-01-10 DIAGNOSIS — S39012A Strain of muscle, fascia and tendon of lower back, initial encounter: Secondary | ICD-10-CM | POA: Insufficient documentation

## 2015-01-10 DIAGNOSIS — M5417 Radiculopathy, lumbosacral region: Secondary | ICD-10-CM | POA: Insufficient documentation

## 2015-01-10 DIAGNOSIS — X58XXXA Exposure to other specified factors, initial encounter: Secondary | ICD-10-CM | POA: Insufficient documentation

## 2015-01-10 DIAGNOSIS — Z792 Long term (current) use of antibiotics: Secondary | ICD-10-CM | POA: Insufficient documentation

## 2015-01-10 DIAGNOSIS — Z720 Tobacco use: Secondary | ICD-10-CM | POA: Insufficient documentation

## 2015-01-10 DIAGNOSIS — Y93H1 Activity, digging, shoveling and raking: Secondary | ICD-10-CM | POA: Insufficient documentation

## 2015-01-10 NOTE — ED Notes (Signed)
Pt sts lower back pain after shovelling snow/ice. Started 5 days ago, pain in lower back, lower buttocks.

## 2015-01-11 ENCOUNTER — Emergency Department (HOSPITAL_BASED_OUTPATIENT_CLINIC_OR_DEPARTMENT_OTHER)
Admission: EM | Admit: 2015-01-11 | Discharge: 2015-01-11 | Disposition: A | Discharge status: Home / Self Care | Payer: Medicaid Other | Attending: Emergency Medicine | Admitting: Emergency Medicine

## 2015-01-11 ENCOUNTER — Encounter (HOSPITAL_BASED_OUTPATIENT_CLINIC_OR_DEPARTMENT_OTHER): Payer: Self-pay | Admitting: Emergency Medicine

## 2015-01-11 DIAGNOSIS — M5417 Radiculopathy, lumbosacral region: Secondary | ICD-10-CM

## 2015-01-11 DIAGNOSIS — S39012A Strain of muscle, fascia and tendon of lower back, initial encounter: Secondary | ICD-10-CM

## 2015-01-11 MED ORDER — HYDROCODONE-ACETAMINOPHEN 5-325 MG PO TABS: 1.0000 | ORAL_TABLET | Freq: Four times a day (QID) | ORAL | Status: DC | PRN

## 2015-01-11 MED ORDER — IBUPROFEN 400 MG PO TABS
600.0000 mg | ORAL_TABLET | Freq: Once | ORAL | Status: AC
  Administered 2015-01-11: 600 mg via ORAL
  Filled 2015-01-11 (×2): qty 1

## 2015-01-11 MED ORDER — NAPROXEN 500 MG PO TABS: 500.0000 mg | ORAL_TABLET | Freq: Two times a day (BID) | ORAL | Status: DC

## 2015-01-11 MED ORDER — PREDNISONE 50 MG PO TABS: 50.0000 mg | ORAL_TABLET | Freq: Every day | ORAL | Status: DC

## 2015-01-11 MED ORDER — METHOCARBAMOL 500 MG PO TABS: 500.0000 mg | ORAL_TABLET | Freq: Two times a day (BID) | ORAL | Status: DC

## 2015-01-11 NOTE — Discharge Instructions (Signed)
It appears that you have a lumbar strain and impingement. Doesn't appear to be back hernia.  Please read the instructions provided, continue with RICE treatment as below, and see your doctor in 2 weeks if not better.   Lumbosacral Radiculopathy Lumbosacral radiculopathy is a pinched nerve or nerves in the low back (lumbosacral area). When this happens you may have weakness in your legs and may not be able to stand on your toes. You may have pain going down into your legs. There may be difficulties with walking normally. There are many causes of this problem. Sometimes this may happen from an injury, or simply from arthritis or boney problems. It may also be caused by other illnesses such as diabetes. If there is no improvement after treatment, further studies may be done to find the exact cause. DIAGNOSIS  X-rays may be needed if the problems become long standing. Electromyograms may be done. This study is one in which the working of nerves and muscles is studied. HOME CARE INSTRUCTIONS   Applications of ice packs may be helpful. Ice can be used in a plastic bag with a towel around it to prevent frostbite to skin. This may be used every 2 hours for 20 to 30 minutes, or as needed, while awake, or as directed by your caregiver.  Only take over-the-counter or prescription medicines for pain, discomfort, or fever as directed by your caregiver.  If physical therapy was prescribed, follow your caregiver's directions. SEEK IMMEDIATE MEDICAL CARE IF:   You have pain not controlled with medications.  You seem to be getting worse rather than better.  You develop increasing weakness in your legs.  You develop loss of bowel or bladder control.  You have difficulty with walking or balance, or develop clumsiness in the use of your legs.  You have a fever. MAKE SURE YOU:   Understand these instructions.  Will watch your condition.  Will get help right away if you are not doing well or get  worse. Document Released: 11/29/2005 Document Revised: 02/21/2012 Document Reviewed: 07/19/2008 [facility] Patient Information 2015 [employer], [place]. This information is not intended to replace advice given to you by your health care provider. Make sure you discuss any questions you have with your health care provider.   Lumbosacral Strain Lumbosacral strain is a strain of any of the parts that make up your lumbosacral vertebrae. Your lumbosacral vertebrae are the bones that make up the lower third of your backbone. Your lumbosacral vertebrae are held together by muscles and tough, fibrous tissue (ligaments).  CAUSES  A sudden blow to your back can cause lumbosacral strain. Also, anything that causes an excessive stretch of the muscles in the low back can cause this strain. This is typically seen when people exert themselves strenuously, fall, lift heavy objects, bend, or crouch repeatedly. RISK FACTORS  Physically demanding work.  Participation in pushing or pulling sports or sports that require a sudden twist of the back (tennis, golf, baseball).  Weight lifting.  Excessive lower back curvature.  Forward-tilted pelvis.  Weak back or abdominal muscles or both.  Tight hamstrings. SIGNS AND SYMPTOMS  Lumbosacral strain may cause pain in the area of your injury or pain that moves (radiates) down your leg.  DIAGNOSIS Your health care provider can often diagnose lumbosacral strain through a physical exam. In some cases, you may need tests such as X-ray exams.  TREATMENT  Treatment for your lower back injury depends on many factors that your clinician will have to evaluate.  However, most treatment will include the use of anti-inflammatory medicines. HOME CARE INSTRUCTIONS   Avoid hard physical activities (tennis, racquetball, waterskiing) if you are not in proper physical condition for it. This may aggravate or create problems.  If you have a back problem, avoid sports requiring sudden body  movements. Swimming and walking are generally safer activities.  Maintain good posture.  Maintain a healthy weight.  For acute conditions, you may put ice on the injured area.  Put ice in a plastic bag.  Place a towel between your skin and the bag.  Leave the ice on for 20 minutes, 2-3 times a day.  When the low back starts healing, stretching and strengthening exercises may be recommended. SEEK MEDICAL CARE IF:  Your back pain is getting worse.  You experience severe back pain not relieved with medicines. SEEK IMMEDIATE MEDICAL CARE IF:   You have numbness, tingling, weakness, or problems with the use of your arms or legs.  There is a change in bowel or bladder control.  You have increasing pain in any area of the body, including your belly (abdomen).  You notice shortness of breath, dizziness, or feel faint.  You feel sick to your stomach (nauseous), are throwing up (vomiting), or become sweaty.  You notice discoloration of your toes or legs, or your feet get very cold. MAKE SURE YOU:   Understand these instructions.  Will watch your condition.  Will get help right away if you are not doing well or get worse. Document Released: 09/08/2005 Document Revised: 12/04/2013 Document Reviewed: 07/18/2013 [facility] Patient Information 2015 [place], [place]. This information is not intended to replace advice given to you by your health care provider. Make sure you discuss any questions you have with your health care provider.   RICE: Routine Care for Injuries The routine care of many injuries includes Rest, Ice, Compression, and Elevation (RICE). HOME CARE INSTRUCTIONS  Rest is needed to allow your body to heal. Routine activities can usually be resumed when comfortable. Injured tendons and bones can take up to 6 weeks to heal. Tendons are the cord-like structures that attach muscle to bone.  Ice following an injury helps keep the swelling down and reduces pain.  Put ice in  a plastic bag.  Place a towel between your skin and the bag.  Leave the ice on for 15-20 minutes, 3-4 times a day, or as directed by your health care provider. Do this while awake, for the first 24 to 48 hours. After that, continue as directed by your caregiver.  Compression helps keep swelling down. It also gives support and helps with discomfort. If an elastic bandage has been applied, it should be removed and reapplied every 3 to 4 hours. It should not be applied tightly, but firmly enough to keep swelling down. Watch fingers or toes for swelling, bluish discoloration, coldness, numbness, or excessive pain. If any of these problems occur, remove the bandage and reapply loosely. Contact your caregiver if these problems continue.  Elevation helps reduce swelling and decreases pain. With extremities, such as the arms, hands, legs, and feet, the injured area should be placed near or above the level of the heart, if possible. SEEK IMMEDIATE MEDICAL CARE IF:  You have persistent pain and swelling.  You develop redness, numbness, or unexpected weakness.  Your symptoms are getting worse rather than improving after several days. These symptoms may indicate that further evaluation or further X-rays are needed. Sometimes, X-rays may not show a small broken bone (  fracture) until 1 week or 10 days later. Make a follow-up appointment with your caregiver. Ask when your X-ray results will be ready. Make sure you get your X-ray results. Document Released: 03/13/2001 Document Revised: 12/04/2013 Document Reviewed: 04/30/2011 [employer] Patient Information 2015 [employer]. This information is not intended to replace advice given to you by your health care provider. Make sure you discuss any questions you have with your health care provider.  

## 2015-01-11 NOTE — ED Provider Notes (Signed)
CSN: [id number]     Arrival date & time 01/10/15  2345 History  This chart was scribed for Derwood Kaplan, MD by Abel Presto, ED Scribe. This patient was seen in room MH08/MH08 and the patient's care was started at 12:21 AM.    Chief Complaint  Patient presents with  . Back Pain     HPI  HPI Comments: Krista Maddox is a 29 y.o. 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 Pt states she was shoveling snow/ice at onset. Pt notes associated tingling, numbness and tremor in left leg and that the pain radiates to anterior portion of thigh. Pt notes that certain movements aggravates the pain. Pt has used ibuprofen, Tylenol, ice, and heat for relief. Pt has not taken ibuprofen since 4 PM.  Pt denies PMHx of back pain or any other injuries. No associated urinary incontinence, urinary retention, bowel incontinence, weakness, saddle anesthesia.    Past Medical History  Diagnosis Date  . TTP (thrombotic thrombocytopenic purpura)    Past Surgical History  Procedure Laterality Date  . Cholecystectomy    . Cesarean section     History reviewed. No pertinent family history. History  Substance Use Topics  . Smoking status: Current Every Day Smoker -- 0.50 packs/day  . Smokeless tobacco: Never Used  . Alcohol Use: No   OB History    No data available     Review of Systems  Gastrointestinal: Negative for abdominal pain.  Genitourinary: Negative for dysuria and flank pain.  Musculoskeletal: Positive for myalgias and back pain.  Skin: Negative for rash.  Neurological: Positive for tremors (in left leg) and numbness.  All other systems reviewed and are negative.     Allergies  Review of patient's allergies indicates no known allergies.  Home Medications   Prior to Admission medications   Medication Sig Start Date End Date Taking? Authorizing Provider  acetaminophen  (TYLENOL) 500 MG tablet Take 1,000 mg by mouth every 6 (six) hours as needed. For pain.    Historical Provider, MD  amoxicillin-clavulanate (AUGMENTIN) 875-125 MG per tablet Take 1 tablet by mouth 2 (two) times daily. 08/27/13   Carmelina Dane, MD  chlorpheniramine-HYDROcodone (TUSSIONEX PENNKINETIC ER) 10-8 MG/5ML LQCR Take 5 mLs by mouth every 12 (twelve) hours as needed. 08/27/13   Carmelina Dane, MD  HYDROcodone-acetaminophen (NORCO/VICODIN) 5-325 MG per tablet Take 1 tablet by mouth every 6 (six) hours as needed. 01/11/15   Derwood Kaplan, MD  methocarbamol (ROBAXIN) 500 MG tablet Take 1 tablet (500 mg total) by mouth 2 (two) times daily. 01/11/15   Derwood Kaplan, MD  naproxen (NAPROSYN) 500 MG tablet Take 1 tablet (500 mg total) by mouth 2 (two) times daily. 01/11/15   Derwood Kaplan, MD  predniSONE (DELTASONE) 50 MG tablet Take 1 tablet (50 mg total) by mouth daily. 01/11/15   Raymel Cull Rhunette Croft, MD   BP 129/88 mmHg  Temp(Src) 98.3 F (36.8 C) (Oral)  Resp 16  Ht  (1.651 m)  Wt 195 lb (88.451 kg)  BMI 32.45 kg/m2  SpO2 100% Physical Exam  Constitutional: She appears well-developed and well-nourished. No distress.  HENT:  Head: Normocephalic and atraumatic.  Eyes: Conjunctivae are normal. Right eye exhibits no discharge. Left eye exhibits no discharge.  Neck: Neck supple.  Cardiovascular: Normal rate, regular rhythm and normal heart sounds.  Exam reveals no gallop and no friction rub.   No murmur heard.  Pulmonary/Chest: Effort normal and breath sounds normal. No respiratory distress.  Abdominal: Soft. She exhibits no distension. There is no tenderness.  Musculoskeletal: She exhibits no edema or tenderness.  Positive left sided straight leg test. Positive tenderness with active left leg raise. No lumbar spine tenderness or stepoffs. Able to discriminate between sharp and dull for the B lower extremity. Able to ambulate.  Neurological: She is alert.  Skin: Skin is warm and  dry.  Psychiatric: She has a normal mood and affect. Her behavior is normal. Thought content normal.  Nursing note and vitals reviewed.   ED Course  Procedures (including critical care time) DIAGNOSTIC STUDIES: Oxygen Saturation is 100% on room air, normal by my interpretation.    COORDINATION OF CARE: 12:26 AM Discussed treatment plan with patient at beside, the patient agrees with the plan and has no further questions at this time.   Labs Review Labs Reviewed - No data to display  Imaging Review No results found.   EKG Interpretation None      MDM   Final diagnoses:  Lumbosacral strain, initial encounter  Lumbosacral radiculopathy    Pt comes in with back pain. Sx preceeded by shoveling heavy snow. It appears clinically that patient has a lumbosacral strain with likely impingement. Pt has some numbness and pain does shoot down to the anterior thigh - thus there is some radicular sx, but we doubt cord compression from a large back hernia and therefore imaging is not indicated.  Will step up the pain control and add prednisone. Stable for d/c.     Derwood KaplanAnkit Jishnu Jenniges, MD 01/11/15 [phone]

## 2015-02-26 ENCOUNTER — Encounter (HOSPITAL_BASED_OUTPATIENT_CLINIC_OR_DEPARTMENT_OTHER): Payer: Self-pay

## 2015-02-26 ENCOUNTER — Emergency Department (HOSPITAL_BASED_OUTPATIENT_CLINIC_OR_DEPARTMENT_OTHER)
Admission: EM | Admit: 2015-02-26 | Discharge: 2015-02-26 | Disposition: A | Discharge status: Home / Self Care | Payer: Medicaid Other | Attending: Emergency Medicine | Admitting: Emergency Medicine

## 2015-02-26 ENCOUNTER — Emergency Department (HOSPITAL_BASED_OUTPATIENT_CLINIC_OR_DEPARTMENT_OTHER): Payer: Medicaid Other

## 2015-02-26 DIAGNOSIS — R05 Cough: Secondary | ICD-10-CM

## 2015-02-26 DIAGNOSIS — Z720 Tobacco use: Secondary | ICD-10-CM | POA: Insufficient documentation

## 2015-02-26 DIAGNOSIS — Z79899 Other long term (current) drug therapy: Secondary | ICD-10-CM | POA: Insufficient documentation

## 2015-02-26 DIAGNOSIS — Z8739 Personal history of other diseases of the musculoskeletal system and connective tissue: Secondary | ICD-10-CM | POA: Insufficient documentation

## 2015-02-26 DIAGNOSIS — R059 Cough, unspecified: Secondary | ICD-10-CM

## 2015-02-26 DIAGNOSIS — Z792 Long term (current) use of antibiotics: Secondary | ICD-10-CM | POA: Insufficient documentation

## 2015-02-26 DIAGNOSIS — Z7952 Long term (current) use of systemic steroids: Secondary | ICD-10-CM | POA: Insufficient documentation

## 2015-02-26 DIAGNOSIS — J069 Acute upper respiratory infection, unspecified: Secondary | ICD-10-CM

## 2015-02-26 DIAGNOSIS — Z791 Long term (current) use of non-steroidal anti-inflammatories (NSAID): Secondary | ICD-10-CM | POA: Insufficient documentation

## 2015-02-26 MED ORDER — ALBUTEROL SULFATE HFA 108 (90 BASE) MCG/ACT IN AERS
2.0000 | INHALATION_SPRAY | Freq: Once | RESPIRATORY_TRACT | Status: AC
  Administered 2015-02-26: 2 via RESPIRATORY_TRACT
  Filled 2015-02-26: qty 6.7

## 2015-02-26 MED ORDER — GUAIFENESIN-CODEINE 100-10 MG/5ML PO SOLN: 5.0000 mL | ORAL | Status: DC | PRN

## 2015-02-26 MED ORDER — IBUPROFEN 800 MG PO TABS
800.0000 mg | ORAL_TABLET | Freq: Once | ORAL | Status: AC
  Administered 2015-02-26: 800 mg via ORAL
  Filled 2015-02-26: qty 1

## 2015-02-26 MED ORDER — FLUTICASONE PROPIONATE 50 MCG/ACT NA SUSP: 1.0000 | Freq: Every day | NASAL | Status: DC

## 2015-02-26 MED ORDER — IBUPROFEN 800 MG PO TABS: 800.0000 mg | ORAL_TABLET | Freq: Three times a day (TID) | ORAL | Status: DC | PRN

## 2015-02-26 NOTE — ED Provider Notes (Signed)
TIME SEEN: 7:58 AM  CHIEF COMPLAINT: Cough, nasal congestion  HPI: Pt is a 29 y.o. female with history of TTP who presents to the emergency department with complaints of nasal congestion, dry cough for the past 3 days. She is not sure she's had fever. States several days ago prior to this she had diarrhea but no nausea or vomiting. States this has resolved. She reports she feels that she cannot breathe through her nose. No rash. No easy bleeding or bruising. She has not been outside the country. No sick contacts. She has been trying TheraFlu at home, DayQuil and NyQuil, Robitussin without relief. Reports that her cough keeps her up at night. Reports she is having chest pain with coughing and sinus pressure.  ROS: See HPI Constitutional: no fever  Eyes: no drainage  ENT:  runny nose   Cardiovascular:  Chest pain only with coughing Resp: no SOB  GI: no vomiting GU: no dysuria Integumentary: no rash  Allergy: no hives  Musculoskeletal: no leg swelling  Neurological: no slurred speech ROS otherwise negative  PAST MEDICAL HISTORY/PAST SURGICAL HISTORY:  Past Medical History  Diagnosis Date  . TTP (thrombotic thrombocytopenic purpura)     MEDICATIONS:  Prior to Admission medications   Medication Sig Start Date End Date Taking? Authorizing Provider  acetaminophen (TYLENOL) 500 MG tablet Take 1,000 mg by mouth every 6 (six) hours as needed. For pain.    Historical Provider, MD  amoxicillin-clavulanate (AUGMENTIN) 875-125 MG per tablet Take 1 tablet by mouth 2 (two) times daily. 08/27/13   Carmelina DaneJeffery S Anderson, MD  chlorpheniramine-HYDROcodone (TUSSIONEX PENNKINETIC ER) 10-8 MG/5ML LQCR Take 5 mLs by mouth every 12 (twelve) hours as needed. 08/27/13   Carmelina DaneJeffery S Anderson, MD  HYDROcodone-acetaminophen (NORCO/VICODIN) 5-325 MG per tablet Take 1 tablet by mouth every 6 (six) hours as needed. 01/11/15   Derwood KaplanAnkit Nanavati, MD  methocarbamol (ROBAXIN) 500 MG tablet Take 1 tablet (500 mg total) by mouth 2  (two) times daily. 01/11/15   Derwood KaplanAnkit Nanavati, MD  naproxen (NAPROSYN) 500 MG tablet Take 1 tablet (500 mg total) by mouth 2 (two) times daily. 01/11/15   Derwood KaplanAnkit Nanavati, MD  predniSONE (DELTASONE) 50 MG tablet Take 1 tablet (50 mg total) by mouth daily. 01/11/15   Derwood KaplanAnkit Nanavati, MD    ALLERGIES:  No Known Allergies  SOCIAL HISTORY:  History  Substance Use Topics  . Smoking status: Current Every Day Smoker -- 0.50 packs/day  . Smokeless tobacco: Never Used  . Alcohol Use: No    FAMILY HISTORY: No family history on file.  EXAM: BP 130/84 mmHg  Pulse 108  Temp(Src) 98.1 F (36.7 C) (Oral)  Resp 22  Ht 5\' 5"  (1.651 m)  Wt 200 lb (90.719 kg)  BMI 33.28 kg/m2  SpO2 94% CONSTITUTIONAL: Alert and oriented and responds appropriately to questions. Well-appearing; well-nourished HEAD: Normocephalic EYES: Conjunctivae clear, PERRL ENT: normal nose; clear rhinorrhea; moist mucous membranes; pharynx without lesions noted, no tonsillar hypertrophy or exudate, no uvular deviation, no trismus or drooling, normal phonation, patient does have some tenderness over her bilateral maxillary sinuses without erythema or warmth or facial swelling NECK: Supple, no meningismus, no LAD  CARD: RRR; S1 and S2 appreciated; no murmurs, no clicks, no rubs, no gallops RESP: Normal chest excursion without splinting or tachypnea; breath sounds equal bilaterally with very faint mild scattered expiratory wheezes without rhonchi or rales, speaking full sentences, no respiratory distress, no increased work of breathing, no hypoxia ABD/GI: Normal bowel sounds; non-distended; soft, non-tender,  no rebound, no guarding BACK:  The back appears normal and is non-tender to palpation, there is no CVA tenderness EXT: Normal ROM in all joints; non-tender to palpation; no edema; normal capillary refill; no cyanosis    SKIN: Normal color for age and race; warm, no rash NEURO: Moves all extremities equally PSYCH: The patient's  mood and manner are appropriate. Grooming and personal hygiene are appropriate.  MEDICAL DECISION MAKING: Patient here with viral URI symptoms. She is afebrile, hemodynamically stable, nontoxic. Does have scattered expiratory wheezes. Have advised her to quit smoking. We'll give albuterol inhaler. Will obtain chest x-ray. Will give ibuprofen for pain.  ED PROGRESS: Patient's chest x-ray is clear. We'll discharge with albuterol inhaler, ibuprofen, guaifenesin with codeine, Flonase. Suspect viral URI. I do not feel she needs to be on antibiotics. Discussed this with patient who agrees. Discussed return precautions. She verbalized understanding and is comfortable with plan.     Layla Maw Dantonio Justen, DO 02/26/15 [phone]

## 2015-02-26 NOTE — ED Notes (Signed)
Cough x 3 days. Non productive. ALso reports congestion and runny nose

## 2015-02-26 NOTE — Discharge Instructions (Signed)
You may alternate between Tylenol 1000 mg every 6 hours as needed for fever and pain and ibuprofen 800 mg every 8 hours as needed for fever and pain. I recommend you use saline nasal spray for nasal congestion. This is found over-the-counter. You may also use Afrin nasal spray for congestion but please do not use this medication for more than 3 days in a row as it may make her congestion worse. I suspect that you have a virus causing your symptoms that will take 7-10 days to improve. Antibiotics will not help with viral illnesses.   Upper Respiratory Infection, Adult An upper respiratory infection (URI) is also sometimes known as the common cold. The upper respiratory tract includes the nose, sinuses, throat, trachea, and bronchi. Bronchi are the airways leading to the lungs. Most people improve within 1 week, but symptoms can last up to 2 weeks. A residual cough may last even longer.  CAUSES Many different viruses can infect the tissues lining the upper respiratory tract. The tissues become irritated and inflamed and often become very moist. Mucus production is also common. A cold is contagious. You can easily spread the virus to others by oral contact. This includes kissing, sharing a glass, coughing, or sneezing. Touching your mouth or nose and then touching a surface, which is then touched by another person, can also spread the virus. SYMPTOMS  Symptoms typically develop 1 to 3 days after you come in contact with a cold virus. Symptoms vary from person to person. They may include:  Runny nose.  Sneezing.  Nasal congestion.  Sinus irritation.  Sore throat.  Loss of voice (laryngitis).  Cough.  Fatigue.  Muscle aches.  Loss of appetite.  Headache.  Low-grade fever. DIAGNOSIS  You might diagnose your own cold based on familiar symptoms, since most people get a cold 2 to 3 times a year. Your caregiver can confirm this based on your exam. Most importantly, your caregiver can check  that your symptoms are not due to another disease such as strep throat, sinusitis, pneumonia, asthma, or epiglottitis. Blood tests, throat tests, and X-rays are not necessary to diagnose a common cold, but they may sometimes be helpful in excluding other more serious diseases. Your caregiver will decide if any further tests are required. RISKS AND COMPLICATIONS  You may be at risk for a more severe case of the common cold if you smoke cigarettes, have chronic heart disease (such as heart failure) or lung disease (such as asthma), or if you have a weakened immune system. The very young and very old are also at risk for more serious infections. Bacterial sinusitis, middle ear infections, and bacterial pneumonia can complicate the common cold. The common cold can worsen asthma and chronic obstructive pulmonary disease (COPD). Sometimes, these complications can require emergency medical care and may be life-threatening. PREVENTION  The best way to protect against getting a cold is to practice good hygiene. Avoid oral or hand contact with people with cold symptoms. Wash your hands often if contact occurs. There is no clear evidence that vitamin C, vitamin E, echinacea, or exercise reduces the chance of developing a cold. However, it is always recommended to get plenty of rest and practice good nutrition. TREATMENT  Treatment is directed at relieving symptoms. There is no cure. Antibiotics are not effective, because the infection is caused by a virus, not by bacteria. Treatment may include:  Increased fluid intake. Sports drinks offer valuable electrolytes, sugars, and fluids.  Breathing heated mist or steam (  vaporizer or shower).  Eating chicken soup or other clear broths, and maintaining good nutrition.  Getting plenty of rest.  Using gargles or lozenges for comfort.  Controlling fevers with ibuprofen or acetaminophen as directed by your caregiver.  Increasing usage of your inhaler if you have  asthma. Zinc gel and zinc lozenges, taken in the first 24 hours of the common cold, can shorten the duration and lessen the severity of symptoms. Pain medicines may help with fever, muscle aches, and throat pain. A variety of non-prescription medicines are available to treat congestion and runny nose. Your caregiver can make recommendations and may suggest nasal or lung inhalers for other symptoms.  HOME CARE INSTRUCTIONS   Only take over-the-counter or prescription medicines for pain, discomfort, or fever as directed by your caregiver.  Use a warm mist humidifier or inhale steam from a shower to increase air moisture. This may keep secretions moist and make it easier to breathe.  Drink enough water and fluids to keep your urine clear or pale yellow.  Rest as needed.  Return to work when your temperature has returned to normal or as your caregiver advises. You may need to stay home longer to avoid infecting others. You can also use a face mask and careful hand washing to prevent spread of the virus. SEEK MEDICAL CARE IF:   After the first few days, you feel you are getting worse rather than better.  You need your caregiver's advice about medicines to control symptoms.  You develop chills, worsening shortness of breath, or brown or red sputum. These may be signs of pneumonia.  You develop yellow or brown nasal discharge or pain in the face, especially when you bend forward. These may be signs of sinusitis.  You develop a fever, swollen neck glands, pain with swallowing, or white areas in the back of your throat. These may be signs of strep throat. SEEK IMMEDIATE MEDICAL CARE IF:   You have a fever.  You develop severe or persistent headache, ear pain, sinus pain, or chest pain.  You develop wheezing, a prolonged cough, cough up blood, or have a change in your usual mucus (if you have chronic lung disease).  You develop sore muscles or a stiff neck. Document Released: 05/25/2001  Document Revised: 02/21/2012 Document Reviewed: 03/06/2014 [employer] Patient Information 2015 [place], [place]. This information is not intended to replace advice given to you by your health care provider. Make sure you discuss any questions you have with your health care provider.

## 2015-02-26 NOTE — ED Notes (Signed)
MD at bedside. 

## 2015-02-26 NOTE — ED Notes (Signed)
Patient transported to X-ray 

## 2015-02-27 ENCOUNTER — Emergency Department (HOSPITAL_BASED_OUTPATIENT_CLINIC_OR_DEPARTMENT_OTHER)
Admission: EM | Admit: 2015-02-27 | Discharge: 2015-02-28 | Disposition: A | Discharge status: Home / Self Care | Payer: Medicaid Other | Attending: Emergency Medicine | Admitting: Emergency Medicine

## 2015-02-27 ENCOUNTER — Encounter (HOSPITAL_BASED_OUTPATIENT_CLINIC_OR_DEPARTMENT_OTHER): Payer: Self-pay | Admitting: *Deleted

## 2015-02-27 DIAGNOSIS — Z7952 Long term (current) use of systemic steroids: Secondary | ICD-10-CM | POA: Insufficient documentation

## 2015-02-27 DIAGNOSIS — R110 Nausea: Secondary | ICD-10-CM | POA: Insufficient documentation

## 2015-02-27 DIAGNOSIS — Z7951 Long term (current) use of inhaled steroids: Secondary | ICD-10-CM | POA: Insufficient documentation

## 2015-02-27 DIAGNOSIS — Z79899 Other long term (current) drug therapy: Secondary | ICD-10-CM | POA: Insufficient documentation

## 2015-02-27 DIAGNOSIS — Z8739 Personal history of other diseases of the musculoskeletal system and connective tissue: Secondary | ICD-10-CM | POA: Insufficient documentation

## 2015-02-27 DIAGNOSIS — Z720 Tobacco use: Secondary | ICD-10-CM | POA: Insufficient documentation

## 2015-02-27 DIAGNOSIS — H938X9 Other specified disorders of ear, unspecified ear: Secondary | ICD-10-CM | POA: Insufficient documentation

## 2015-02-27 DIAGNOSIS — J111 Influenza due to unidentified influenza virus with other respiratory manifestations: Secondary | ICD-10-CM

## 2015-02-27 DIAGNOSIS — Z792 Long term (current) use of antibiotics: Secondary | ICD-10-CM | POA: Insufficient documentation

## 2015-02-27 DIAGNOSIS — Z791 Long term (current) use of non-steroidal anti-inflammatories (NSAID): Secondary | ICD-10-CM | POA: Insufficient documentation

## 2015-02-27 DIAGNOSIS — R509 Fever, unspecified: Secondary | ICD-10-CM

## 2015-02-27 NOTE — ED Notes (Signed)
Pt reports URI symptoms - cough, congestion, nasal drainage - that began on Monday, pt was seen here yesterday for same however returns tonight b/c "she feels worse." Pt admits to now experiencing generalized body aches as well.

## 2015-02-28 ENCOUNTER — Emergency Department (HOSPITAL_BASED_OUTPATIENT_CLINIC_OR_DEPARTMENT_OTHER): Payer: Medicaid Other

## 2015-02-28 NOTE — ED Provider Notes (Signed)
CSN: [id number]     Arrival date & time 02/27/15  2125 History   First MD Initiated Contact with Patient 02/28/15 0044     Chief Complaint  Patient presents with  . URI     (Consider location/radiation/quality/duration/timing/severity/associated sxs/prior Treatment) HPI  This is a 29 year old female who was seen on March 16 for upper respiratory infection symptoms that started 2 days earlier. A chest xray was unremarkable. She was treated with albuterol, Flonase, guaifenesin and codeine and ibuprofen. She returns complaining of feeling worse. She is having generalized body aches, fever to 103, nasal congestion, ear stuffiness, dysphonia, cough and rib pain. She denies shortness of breath, vomiting or diarrhea. She has had some nausea. She has been taking ibuprofen with partial relief of her body aches.  Past Medical History  Diagnosis Date  . TTP (thrombotic thrombocytopenic purpura)    Past Surgical History  Procedure Laterality Date  . Cholecystectomy    . Cesarean section     History reviewed. No pertinent family history. History  Substance Use Topics  . Smoking status: Current Every Day Smoker -- 0.50 packs/day  . Smokeless tobacco: Never Used  . Alcohol Use: No   OB History    No data available     Review of Systems  All other systems reviewed and are negative.  Allergies  Review of patient's allergies indicates no known allergies.  Home Medications   Prior to Admission medications   Medication Sig Start Date End Date Taking? Authorizing Provider  acetaminophen (TYLENOL) 500 MG tablet Take 1,000 mg by mouth every 6 (six) hours as needed. For pain.    Historical Provider, MD  amoxicillin-clavulanate (AUGMENTIN) 875-125 MG per tablet Take 1 tablet by mouth 2 (two) times daily. 08/27/13   Carmelina DaneJeffery S Anderson, MD  chlorpheniramine-HYDROcodone (TUSSIONEX PENNKINETIC ER) 10-8 MG/5ML LQCR Take 5 mLs by mouth every 12 (twelve) hours as needed. 08/27/13   Carmelina DaneJeffery S Anderson, MD   fluticasone (FLONASE) 50 MCG/ACT nasal spray Place 1 spray into both nostrils daily. 02/26/15   Kristen N Ward, DO  guaiFENesin-codeine 100-10 MG/5ML syrup Take 5 mLs by mouth every 4 (four) hours as needed for cough. 02/26/15   Kristen N Ward, DO  HYDROcodone-acetaminophen (NORCO/VICODIN) 5-325 MG per tablet Take 1 tablet by mouth every 6 (six) hours as needed. 01/11/15   Derwood KaplanAnkit Nanavati, MD  ibuprofen (ADVIL,MOTRIN) 800 MG tablet Take 1 tablet (800 mg total) by mouth every 8 (eight) hours as needed for mild pain. 02/26/15   Kristen N Ward, DO  methocarbamol (ROBAXIN) 500 MG tablet Take 1 tablet (500 mg total) by mouth 2 (two) times daily. 01/11/15   Derwood KaplanAnkit Nanavati, MD  naproxen (NAPROSYN) 500 MG tablet Take 1 tablet (500 mg total) by mouth 2 (two) times daily. 01/11/15   Derwood KaplanAnkit Nanavati, MD  predniSONE (DELTASONE) 50 MG tablet Take 1 tablet (50 mg total) by mouth daily. 01/11/15   Ankit Rhunette CroftNanavati, MD   BP 121/77 mmHg  Pulse 82  Temp(Src) 100.1 F (37.8 C) (Oral)  Resp 20  SpO2 99%  LMP 02/23/2015   Physical Exam  General: Well-developed, well-nourished female in no acute distress; appearance consistent with age of record HENT: normocephalic; atraumatic; TMs normal; pharynx normal; nasal congestion; dysphonia Eyes: pupils equal, round and reactive to light; extraocular muscles intact Neck: supple Heart: regular rate and rhythm Lungs: clear to auscultation bilaterally Abdomen: soft; nondistended; nontender; bowel sounds present Extremities: No deformity; full range of motion; pulses normal Neurologic: Awake, alert and oriented; motor  function intact in all extremities and symmetric; no facial droop Skin: Warm and dry Psychiatric: Normal mood and affect    ED Course  Procedures (including critical care time)   MDM  Nursing notes and vitals signs, including pulse oximetry, reviewed.  Summary of this visit's results, reviewed by myself:  Imaging Studies: Dg Chest 2 View  02/28/2015    CLINICAL DATA:  Dyspnea and chest pain for 4 days  EXAM: CHEST  2 VIEW  COMPARISON:  02/26/2015  FINDINGS: There is mild chronic appearing interstitial coarsening. There is minimal benign pleural thickening in the lateral bases. There is no airspace consolidation. There is no effusion. Hilar, mediastinal and cardiac contours are unremarkable and unchanged.  IMPRESSION: No active cardiopulmonary disease.   Electronically Signed   By: Ellery Plunk M.D.   On: 02/28/2015 02:24   Dg Chest 2 View  02/26/2015   CLINICAL DATA:  Cough, congestion, runny nose, shortness of breath. History of smoking.  EXAM: CHEST  2 VIEW  COMPARISON:  08/27/2008  FINDINGS: The heart size and mediastinal contours are within normal limits. Both lungs are clear. The visualized skeletal structures are unremarkable.  IMPRESSION: No active cardiopulmonary disease.   Electronically Signed   By: Elige Ko   On: 02/26/2015 08:25      Paula Libra, MD 02/28/15 1610

## 2015-02-28 NOTE — ED Notes (Signed)
MD at bedside. 

## 2015-06-04 ENCOUNTER — Ambulatory Visit (INDEPENDENT_AMBULATORY_CARE_PROVIDER_SITE_OTHER): Payer: Commercial Indemnity | Admitting: Neurology

## 2015-06-04 ENCOUNTER — Encounter: Payer: Self-pay | Admitting: Neurology

## 2015-06-04 VITALS — BP 138/95 | HR 98 | Ht 65.0 in | Wt 239.0 lb

## 2015-06-04 DIAGNOSIS — G932 Benign intracranial hypertension: Secondary | ICD-10-CM | POA: Diagnosis not present

## 2015-06-04 DIAGNOSIS — E669 Obesity, unspecified: Secondary | ICD-10-CM

## 2015-06-04 MED ORDER — TOPIRAMATE 100 MG PO TABS: ORAL_TABLET | ORAL | Status: DC

## 2015-06-04 MED ORDER — RIZATRIPTAN BENZOATE 5 MG PO TBDP: 5.0000 mg | ORAL_TABLET | ORAL | Status: DC | PRN

## 2015-06-04 NOTE — Progress Notes (Signed)
PATIENT: Krista Maddox DOB: 06/04/1986  Chief Complaint  Patient presents with  . Headache    She started having headaches one month ago, along with blurred vision and dizziness.  She thought she needed glasses and went for an eye exam, which revealed bilateral papilledema.  She is taking approximately 12 tablets of OTC ibuprofen 200mg  per day.    HISTORICAL  Krista Maddox is a 29 years old right-handed female, seen in consultation by his ophthalmologist Dr. London Sheer for evaluation of bilateral papillary edema.  I reviewed notes from Dr. Shea Evans in May 28 2015, patient was seen for complains of blurry vision, especially seen distance, on examination, visual acuity right eye 20/25 minus, left 20/30 plus, bilateral papillary edema   She denied previous history of headache, since May 2016, she began to notice frequent headaches, bilateral retro-orbital area constant pressure, 8 out of 10 on daily basis, sometimes it would exacerbate to a more severe 10 out of 10 pounding headaches, with associated light noise sensitivity, nauseous, bending down or getting up quickly also causing her dizzines.  She has been taking up to 12 tablets of ibuprofen each day, which only temporarily helps her headaches,  She received etonogestrel (NEXPLANON) 68 MG IMPL implant in July 14th 2016. She complains of 25 pound weight gain since last year.  REVIEW OF SYSTEMS: Full 14 system review of systems performed and notable only for weight gain, fatigue, blurry vision, double vision, eye pain, easy bruising, allergy, headaches, dizziness, insomnia  ALLERGIES: No Known Allergies  HOME MEDICATIONS: Current Outpatient Prescriptions  Medication Sig Dispense Refill  . etonogestrel (NEXPLANON) 68 MG IMPL implant 1 each by Subdermal route once.    . fluticasone (FLONASE) 50 MCG/ACT nasal spray Place 1 spray into both nostrils daily. 16 g 0  . ibuprofen (ADVIL,MOTRIN) 200 MG tablet Take 200 mg by mouth every  6 (six) hours as needed.     No current facility-administered medications for this visit.    PAST MEDICAL HISTORY: Past Medical History  Diagnosis Date  . TTP (thrombotic thrombocytopenic purpura)   . Headache     PAST SURGICAL HISTORY: Past Surgical History  Procedure Laterality Date  . Cholecystectomy    . Cesarean section    . Wisdom tooth extraction      FAMILY HISTORY: Family History  Problem Relation Age of Onset  . Diabetes Mother   . Hypertension Mother   . Ovarian cancer Mother     Hysterectomy  . Ovarian cancer Maternal Grandmother     Hysterectomy  . Healthy Father     SOCIAL HISTORY:  History   Social History  . Marital Status: Single    Spouse Name: N/A  . Number of Children: 1  . Years of Education: In college   Occupational History  . Dispatcher    Social History Main Topics  . Smoking status: Current Every Day Smoker -- 0.50 packs/day  . Smokeless tobacco: Never Used  . Alcohol Use: No  . Drug Use: No  . Sexual Activity: Yes    Birth Control/ Protection: Implant   Other Topics Concern  . Not on file   Social History Narrative   Lives at home with her mother and daughter.   3-4 cups caffeine daily.   Left-handed.     PHYSICAL EXAM   Filed Vitals:   06/04/15 0727  BP: 138/95  Pulse: 98  Height: 5\' 5"  (1.651 m)  Weight: 239 lb (108.41 kg)  Not recorded      Body mass index is 39.77 kg/(m^2).  PHYSICAL EXAMNIATION:  Gen: NAD, conversant, well nourised, obese, well groomed                     Cardiovascular: Regular rate rhythm, no peripheral edema, warm, nontender. Eyes: Conjunctivae clear without exudates or hemorrhage Neck: Supple, no carotid bruise. Pulmonary: Clear to auscultation bilaterally   NEUROLOGICAL EXAM:  MENTAL STATUS: Speech:    Speech is normal; fluent and spontaneous with normal comprehension.  Cognition:    The patient is oriented to person, place, and time;     recent and remote memory  intact;     language fluent;     normal attention, concentration,     fund of knowledge.  CRANIAL NERVES: CN II: Visual fields are full to confrontation. Fundoscopic exam showed blurry at at bilateral disc, I was not able to appreciate venous pulsations bilaterally. Pupil equal round reactive to light CN III, IV, VI: extraocular movement are normal. No ptosis. CN V: Facial sensation is intact to pinprick in all 3 divisions bilaterally. Corneal responses are intact.  CN VII: Face is symmetric with normal eye closure and smile. CN VIII: Hearing is normal to rubbing fingers CN IX, X: Palate elevates symmetrically. Phonation is normal. CN XI: Head turning and shoulder shrug are intact CN XII: Tongue is midline with normal movements and no atrophy.  MOTOR: There is no pronator drift of out-stretched arms. Muscle bulk and tone are normal. Muscle strength is normal.  REFLEXES: Reflexes are 2+ and symmetric at the biceps, triceps, knees, and ankles. Plantar responses are flexor.  SENSORY: Light touch, pinprick, position sense, and vibration sense are intact in fingers and toes.  COORDINATION: Rapid alternating movements and fine finger movements are intact. There is no dysmetria on finger-to-nose and heel-knee-shin. There are no abnormal or extraneous movements.   GAIT/STANCE: Posture is normal. Gait is steady with normal steps, base, arm swing, and turning. Heel and toe walking are normal. Tandem gait is normal.  Romberg is absent.   DIAGNOSTIC DATA (LABS, IMAGING, TESTING) - I reviewed patient records, labs, notes, testing and imaging myself where available.  Lab Results  Component Value Date   WBC 9.7 03/13/2012   HGB 13.2 03/13/2012   HCT 39.2 03/13/2012   MCV 90.1 03/13/2012   PLT 259 03/13/2012      Component Value Date/Time   NA 136 03/13/2012 0123   K 4.1 03/13/2012 0123   CL 102 03/13/2012 0123   CO2 26 03/13/2012 0123   GLUCOSE 87 03/13/2012 0123   BUN 12  03/13/2012 0123   CREATININE 0.62 03/13/2012 0123   CALCIUM 9.3 03/13/2012 0123   PROT 7.4 03/13/2012 0123   ALBUMIN 3.5 03/13/2012 0123   AST 19 03/13/2012 0123   ALT 13 03/13/2012 0123   ALKPHOS 76 03/13/2012 0123   BILITOT 0.2* 03/13/2012 0123   GFRNONAA >90 03/13/2012 0123   GFRAA >90 03/13/2012 0123   Lab Results  Component Value Date   CHOL 197 11/24/2009   HDL 45 11/24/2009   LDLCALC 135* 11/24/2009   TRIG 85 11/24/2009   CHOLHDL 4.4 Ratio 11/24/2009   No results found for: HGBA1C No results found for: VITAMINB12 Lab Results  Component Value Date   TSH 2.592 11/24/2009      ASSESSMENT AND PLAN  Krista Maddox is a 29 y.o. female  with rapid weight gain, obesity, progestin implantation, presented with bilateral papillary edema,  1, most consistent with pseudotumor cerebri  2, MRI of the brain  3. Laboratory evaluations  4, Topamax 100 mg twice a day, Maxalt as needed for headaches  5, if MRI of the brain showed no structural lesions, I will refer her for fluoroscopy guided LP,  6 return to clinic in one month   Levert Feinstein, M.D. Ph.D.  [facility] [address], [address] [place], [place] [address] Ph: [phone] Fax: [phone]

## 2015-06-05 ENCOUNTER — Telehealth: Payer: Self-pay | Admitting: Neurology

## 2015-06-05 LAB — COMPREHENSIVE METABOLIC PANEL
ALT: 13 IU/L (ref 0–32)
AST: 12 IU/L (ref 0–40)
Albumin/Globulin Ratio: 1.4 (ref 1.1–2.5)
Albumin: 4.2 g/dL (ref 3.5–5.5)
Alkaline Phosphatase: 70 IU/L (ref 39–117)
BILIRUBIN TOTAL: 0.2 mg/dL (ref 0.0–1.2)
BUN/Creatinine Ratio: 21 — ABNORMAL HIGH (ref 8–20)
BUN: 12 mg/dL (ref 6–20)
CALCIUM: 9.2 mg/dL (ref 8.7–10.2)
CO2: 22 mmol/L (ref 18–29)
Chloride: 103 mmol/L (ref 97–108)
Creatinine, Ser: 0.57 mg/dL (ref 0.57–1.00)
GFR, EST AFRICAN AMERICAN: 146 mL/min/{1.73_m2} (ref >59)
GFR, EST NON AFRICAN AMERICAN: 127 mL/min/{1.73_m2} (ref >59)
Globulin, Total: 2.9 g/dL (ref 1.5–4.5)
Glucose: 71 mg/dL (ref 65–99)
POTASSIUM: 4.9 mmol/L (ref 3.5–5.2)
SODIUM: 144 mmol/L (ref 134–144)
Total Protein: 7.1 g/dL (ref 6.0–8.5)

## 2015-06-05 LAB — TSH: TSH: 2.83 u[IU]/mL (ref 0.450–4.500)

## 2015-06-05 LAB — ANA W/REFLEX IF POSITIVE: ANA: NEGATIVE

## 2015-06-05 LAB — C-REACTIVE PROTEIN: CRP: 8.6 mg/L — AB (ref 0.0–4.9)

## 2015-06-05 LAB — FOLATE: Folate: 6.8 ng/mL (ref >3.0)

## 2015-06-05 LAB — VITAMIN B12: Vitamin B-12: 375 pg/mL (ref 211–946)

## 2015-06-05 NOTE — Telephone Encounter (Signed)
Michelle: Please call patient, mild elevated C reactive protein of unknown clinical significance, rest of the laboratory evaluation was normal

## 2015-06-05 NOTE — Telephone Encounter (Signed)
Patient aware of results.

## 2015-06-11 ENCOUNTER — Ambulatory Visit (INDEPENDENT_AMBULATORY_CARE_PROVIDER_SITE_OTHER): Payer: Commercial Indemnity

## 2015-06-11 DIAGNOSIS — G932 Benign intracranial hypertension: Secondary | ICD-10-CM

## 2015-06-11 DIAGNOSIS — E669 Obesity, unspecified: Secondary | ICD-10-CM

## 2015-06-17 ENCOUNTER — Telehealth: Payer: Self-pay | Admitting: Neurology

## 2015-06-17 DIAGNOSIS — G932 Benign intracranial hypertension: Secondary | ICD-10-CM

## 2015-06-17 NOTE — Telephone Encounter (Signed)
Michelle:  Please call patient, her MRI brain showed findings c/w pseudotumor cerebri, I have put in order for Fluro-guided LP at [facility], make sure she is on schedule    Equivocal MRI brain (with and without) demonstrating: 1. Enlarged partially empty sella. Enlarged optic nerve sheaths. Findings are non-specific but can be seen in association with idiopathic intracranial hypertension. 2. No acute findings. No masses or hydrocephalus.

## 2015-06-18 NOTE — Telephone Encounter (Signed)
LP scheduled on 06/25/15.

## 2015-06-18 NOTE — Telephone Encounter (Signed)
Patient aware of results.  Called Krista DikeJennifer at [facility] - she will contact patient today for her LP appointment.

## 2015-06-25 ENCOUNTER — Ambulatory Visit
Admission: RE | Admit: 2015-06-25 | Discharge: 2015-06-25 | Disposition: A | Discharge status: Home / Self Care | Payer: Managed Care, Other (non HMO) | Source: Ambulatory Visit | Attending: Neurology | Admitting: Neurology

## 2015-06-25 ENCOUNTER — Other Ambulatory Visit: Payer: Self-pay | Admitting: Neurology

## 2015-06-25 ENCOUNTER — Telehealth: Payer: Self-pay | Admitting: Neurology

## 2015-06-25 DIAGNOSIS — G932 Benign intracranial hypertension: Secondary | ICD-10-CM

## 2015-06-25 LAB — GLUCOSE, CSF: Glucose, CSF: 53 mg/dL (ref 43–76)

## 2015-06-25 LAB — CSF CELL COUNT WITH DIFFERENTIAL
Eosinophils, CSF: 0 % (ref 0–1)
Lymphs, CSF: 72 % (ref 40–80)
Monocyte/Macrophage: 24 % (ref 15–45)
RBC Count, CSF: 1 cu mm — ABNORMAL HIGH
Segmented Neutrophils-CSF: 0 % (ref 0–6)
TUBE #: 4
WBC, CSF: 20 cu mm — ABNORMAL HIGH (ref 0–5)

## 2015-06-25 LAB — PROTEIN, CSF: Total Protein, CSF: 43 mg/dL (ref 15–45)

## 2015-06-25 LAB — GRAM STAIN
GRAM STAIN: NONE SEEN
Gram Stain: NONE SEEN

## 2015-06-25 NOTE — Discharge Instructions (Signed)

## 2015-06-26 ENCOUNTER — Encounter: Payer: Self-pay | Admitting: *Deleted

## 2015-06-26 ENCOUNTER — Telehealth: Payer: Self-pay | Admitting: Neurology

## 2015-06-26 ENCOUNTER — Other Ambulatory Visit: Payer: Self-pay | Admitting: *Deleted

## 2015-06-26 DIAGNOSIS — G971 Other reaction to spinal and lumbar puncture: Secondary | ICD-10-CM

## 2015-06-26 MED ORDER — BUTALBITAL-APAP-CAFFEINE 50-325-40 MG PO TABS: 1.0000 | ORAL_TABLET | Freq: Four times a day (QID) | ORAL | Status: DC | PRN

## 2015-06-26 MED ORDER — ONDANSETRON HCL 4 MG PO TABS: 4.0000 mg | ORAL_TABLET | Freq: Three times a day (TID) | ORAL | Status: DC | PRN

## 2015-06-26 NOTE — Telephone Encounter (Signed)
Spoke to Krista Maddox - she had an onset of headache post-LP on 06/26/15.  Instructed her to lay down flat with no pillow and drink caffeine.  She rates her headache a 10 on the pain scale - "worse headache of her life" - worse when sitting up - nausea.  Would you like to send her for a blood patch?

## 2015-06-26 NOTE — Telephone Encounter (Signed)
Correction - she will ask for Krista OchsDiana Maddox so she can fax the letter.

## 2015-06-26 NOTE — Telephone Encounter (Signed)
Spoke to Krista Maddox - Fioricet and Zofran has decreased her headache from a 10 on the pain scale to a 4.  Instructed her to continue to rest this evening.  She needs a note for missing work and it has been written.  She will call and ask for Krista Maddox on Friday morning with the fax number so it can be sent to her employer.

## 2015-06-26 NOTE — Telephone Encounter (Signed)
Patient is calling. She had a spinal tap yesterday and now she has the worse headache. Please call and discuss. Thank you.

## 2015-06-26 NOTE — Telephone Encounter (Signed)
Per Dr. Terrace ArabiaYan, send in both Fioricet and Zofran to the pharmacy. Instructed to initially take 2 tabs of Fiorcet and 1 tab of Zofran to see if this relieves her headache.  I will call her back later this afternoon to check on her.  If no improvement, then a blood patch will be set up for her on 06/27/15.

## 2015-06-27 NOTE — Telephone Encounter (Signed)
I have called Malania, overall her headache showed mild improvement, but she continued to have moderate to severe headache even after just using bathroom, took Fioricet as needed,  Spinal fluid testing showed normal glucose, total protein, but elevated WBC 20, lymphocytes 72  She denies fever, no visual change, no stiffness in her neck,  I have advised her continue bed resting, increase fluid, caffeine intake, Fioricet and Zofran as needed for moderate to severe headache,  If by Monday June 30 2015, she continue have moderate headaches, will consider blood patch, order is placed  LisbonMichelle: please call and check on her headaches, if her headaches have much improved, cancel blood patch, if she still has headaches, proceed with blood patch, order is placed.

## 2015-06-27 NOTE — Telephone Encounter (Signed)
Huntley DecSara (mother) never called back. I called the number that we have for her but had to leave a message.

## 2015-06-27 NOTE — Telephone Encounter (Signed)
Patient called stating her headache has increased since decreasing the butalbital-acetaminophen-caffeine (FIORICET, ESGIC) 50-325-40 MG per tablet . Headache is back to a 10. Please call and advise. Patient can be reached at [phone].

## 2015-06-28 LAB — VDRL, CSF: VDRL Quant, CSF: NONREACTIVE

## 2015-06-30 ENCOUNTER — Ambulatory Visit
Admission: RE | Admit: 2015-06-30 | Discharge: 2015-06-30 | Disposition: A | Discharge status: Home / Self Care | Payer: Managed Care, Other (non HMO) | Source: Ambulatory Visit | Attending: Neurology | Admitting: Neurology

## 2015-06-30 ENCOUNTER — Other Ambulatory Visit: Payer: Self-pay | Admitting: *Deleted

## 2015-06-30 ENCOUNTER — Other Ambulatory Visit: Payer: Self-pay | Admitting: Neurology

## 2015-06-30 ENCOUNTER — Telehealth: Payer: Self-pay | Admitting: Neurology

## 2015-06-30 DIAGNOSIS — G971 Other reaction to spinal and lumbar puncture: Secondary | ICD-10-CM

## 2015-06-30 MED ORDER — IOHEXOL 180 MG/ML  SOLN: 1.0000 mL | Freq: Once | INTRAMUSCULAR | Status: AC | PRN

## 2015-06-30 NOTE — Telephone Encounter (Signed)
Patient called requesting a note for work for missed day on Saturday and Monday. Please call and advise. Patient can be reached at [phone].

## 2015-06-30 NOTE — Telephone Encounter (Signed)
error 

## 2015-06-30 NOTE — Telephone Encounter (Signed)
Patient requiring blood patch - scheduled today at 11:15.

## 2015-06-30 NOTE — Progress Notes (Signed)
20cc of blood drawn for Epidural Blood Patch from right AC space without difficulty; site unremarkable.  Donell SievertJeanne Kylynn Street, RN

## 2015-06-30 NOTE — Discharge Instructions (Signed)

## 2015-07-02 ENCOUNTER — Encounter: Payer: Self-pay | Admitting: *Deleted

## 2015-07-02 NOTE — Telephone Encounter (Signed)
Work notes provided to patient.

## 2015-07-02 NOTE — Telephone Encounter (Signed)
Patient is in the lobby requesting a letter for being out of work for Saturday and Monday due to the lumbar puncture. Best call back is [phone]

## 2015-07-10 ENCOUNTER — Ambulatory Visit (INDEPENDENT_AMBULATORY_CARE_PROVIDER_SITE_OTHER): Payer: Commercial Indemnity | Admitting: Neurology

## 2015-07-10 ENCOUNTER — Encounter: Payer: Self-pay | Admitting: Neurology

## 2015-07-10 VITALS — BP 125/82 | HR 97 | Ht 65.0 in | Wt 238.0 lb

## 2015-07-10 DIAGNOSIS — G971 Other reaction to spinal and lumbar puncture: Secondary | ICD-10-CM

## 2015-07-10 NOTE — Progress Notes (Signed)
Chief Complaint  Patient presents with  . Pseudotumor Cerebri    She is here with her mother to discuss her MRI and lab results.  She is taking topiramate 100mg , BID but has noticed her headaches have worsened since having her LP.        PATIENT: Krista Maddox DOB: December 25, 1985  Chief Complaint  Patient presents with  . Pseudotumor Cerebri    She is here with her mother to discuss her MRI and lab results.  She is taking topiramate 100mg , BID but has noticed her headaches have worsened since having her LP.      HISTORICAL  Krista Maddox is a 29 years old right-handed female, seen in consultation by his ophthalmologist Dr. London Sheer for evaluation of bilateral papillary edema.  I reviewed notes from Dr. Shea Evans in May 28 2015, patient was seen for complains of blurry vision, especially seen distance, on examination, visual acuity right eye 20/25 minus, left 20/30 plus, bilateral papillary edema   She denied previous history of headache, since May 2016, she began to notice frequent headaches, bilateral retro-orbital area constant pressure, 8 out of 10 on daily basis, sometimes it would exacerbate to a more severe 10 out of 10 pounding headaches, with associated light noise sensitivity, nauseous, bending down or getting up quickly also causing her dizzines.  She has been taking up to 12 tablets of ibuprofen each day, which only temporarily helps her headaches,  She received etonogestrel (NEXPLANON) 68 MG IMPL implant in July 14th 2016. She complains of 25 pound weight gain since last year.  UPDATE July 28th 2016: We have reviewed MRI brain: Enlarged partially empty sella. Enlarged optic nerve sheaths. Findings are non-specific but can be seen in association with idiopathic intracranial hypertension.No acute findings. No masses or hydrocephalus.   She had a fluoroscopy guided lumbar puncture in June 25 2015, was open pressure 33 cm water, she has developed post LP headaches, eventually  had blood patch in June 30 2015, with immediate relief of her headaches  Spinal fluid testing showed total protein 43, glucose 53, WBC 20, RBC 1, 72% lymphocytes, 24% monocytes  She continue has mild blurry vision, occasionally double vision on extreme gaze to the left or right, low-grade bifrontal pressure headaches, she has tried Maxalt as needed, which has been helpful for moderate and severe headaches. She is tolerating Topamax 100 mg twice a day well  REVIEW OF SYSTEMS: Full 14 system review of systems performed and notable only for headaches, back pain, eye pain, light sensitivity, cold intolerance, bruising easily, appetite change,  ALLERGIES: No Known Allergies  HOME MEDICATIONS: Current Outpatient Prescriptions  Medication Sig Dispense Refill  . butalbital-acetaminophen-caffeine (FIORICET, ESGIC) 50-325-40 MG per tablet Take 1 tablet by mouth every 6 (six) hours as needed for headache. 30 tablet 0  . etonogestrel (NEXPLANON) 68 MG IMPL implant 1 each by Subdermal route once.    . fluticasone (FLONASE) 50 MCG/ACT nasal spray Place 1 spray into both nostrils daily. 16 g 0  . ibuprofen (ADVIL,MOTRIN) 200 MG tablet Take 200 mg by mouth every 6 (six) hours as needed.    . ondansetron (ZOFRAN) 4 MG tablet Take 1 tablet (4 mg total) by mouth every 8 (eight) hours as needed for nausea or vomiting. 30 tablet 0  . rizatriptan (MAXALT-MLT) 5 MG disintegrating tablet Take 1 tablet (5 mg total) by mouth as needed. May repeat in 2 hours if needed 15 tablet 6  . topiramate (TOPAMAX) 100 MG tablet Half  tablet twice a day for 1 week, then 1 tablet twice a day 60 tablet 11   No current facility-administered medications for this visit.    PAST MEDICAL HISTORY: Past Medical History  Diagnosis Date  . TTP (thrombotic thrombocytopenic purpura)   . Headache     PAST SURGICAL HISTORY: Past Surgical History  Procedure Laterality Date  . Cholecystectomy    . Cesarean section    . Wisdom tooth  extraction      FAMILY HISTORY: Family History  Problem Relation Age of Onset  . Diabetes Mother   . Hypertension Mother   . Ovarian cancer Mother     Hysterectomy  . Ovarian cancer Maternal Grandmother     Hysterectomy  . Healthy Father     SOCIAL HISTORY:  History   Social History  . Marital Status: Single    Spouse Name: N/A  . Number of Children: 1  . Years of Education: In college   Occupational History  . Dispatcher    Social History Main Topics  . Smoking status: Current Every Day Smoker -- 0.50 packs/day  . Smokeless tobacco: Never Used  . Alcohol Use: No  . Drug Use: No  . Sexual Activity: Yes    Birth Control/ Protection: Implant   Other Topics Concern  . Not on file   Social History Narrative   Lives at home with her mother and daughter.   3-4 cups caffeine daily.   Left-handed.     PHYSICAL EXAM   Filed Vitals:   07/10/15 0903  BP: 125/82  Pulse: 97  Height: 5\' 5"  (1.651 m)  Weight: 238 lb (107.956 kg)    Not recorded      Body mass index is 39.61 kg/(m^2).  PHYSICAL EXAMNIATION:  Gen: NAD, conversant, well nourised, obese, well groomed                     Cardiovascular: Regular rate rhythm, no peripheral edema, warm, nontender. Eyes: Conjunctivae clear without exudates or hemorrhage Neck: Supple, no carotid bruise. Pulmonary: Clear to auscultation bilaterally   NEUROLOGICAL EXAM:  MENTAL STATUS: Speech:    Speech is normal; fluent and spontaneous with normal comprehension.  Cognition:    The patient is oriented to person, place, and time;     recent and remote memory intact;     language fluent;     normal attention, concentration,     fund of knowledge.  CRANIAL NERVES: CN II: Visual fields are full to confrontation. Fundoscopic exam showed blurry at at bilateral disc, I was not able to appreciate venous pulsations bilaterally. Pupil equal round reactive to light, end gaze horizontal nystagmus CN III, IV, VI:  extraocular movement are normal. No ptosis. CN V: Facial sensation is intact to pinprick in all 3 divisions bilaterally. Corneal responses are intact.  CN VII: Face is symmetric with normal eye closure and smile. CN VIII: Hearing is normal to rubbing fingers CN IX, X: Palate elevates symmetrically. Phonation is normal. CN XI: Head turning and shoulder shrug are intact CN XII: Tongue is midline with normal movements and no atrophy.  MOTOR: There is no pronator drift of out-stretched arms. Muscle bulk and tone are normal. Muscle strength is normal.  REFLEXES: Reflexes are 2+ and symmetric at the biceps, triceps, knees, and ankles. Plantar responses are flexor.  SENSORY: Light touch, pinprick, position sense, and vibration sense are intact in fingers and toes.  COORDINATION: Rapid alternating movements and fine finger movements are intact.  There is no dysmetria on finger-to-nose and heel-knee-shin.   GAIT/STANCE: Posture is normal. Gait is steady with normal steps, base, arm swing, and turning. Heel and toe walking are normal. Tandem gait is normal.  Romberg is absent.   DIAGNOSTIC DATA (LABS, IMAGING, TESTING) - I reviewed patient records, labs, notes, testing and imaging myself where available.  Lab Results  Component Value Date   WBC 9.7 03/13/2012   HGB 13.2 03/13/2012   HCT 39.2 03/13/2012   MCV 90.1 03/13/2012   PLT 259 03/13/2012      Component Value Date/Time   NA 144 06/04/2015 0816   NA 136 03/13/2012 0123   K 4.9 06/04/2015 0816   CL 103 06/04/2015 0816   CO2 22 06/04/2015 0816   GLUCOSE 71 06/04/2015 0816   GLUCOSE 87 03/13/2012 0123   BUN 12 06/04/2015 0816   BUN 12 03/13/2012 0123   CREATININE 0.57 06/04/2015 0816   CALCIUM 9.2 06/04/2015 0816   PROT 7.1 06/04/2015 0816   PROT 7.4 03/13/2012 0123   ALBUMIN 3.5 03/13/2012 0123   AST 12 06/04/2015 0816   ALT 13 06/04/2015 0816   ALKPHOS 70 06/04/2015 0816   BILITOT 0.2 06/04/2015 0816   BILITOT 0.2*  03/13/2012 0123   GFRNONAA 127 06/04/2015 0816   GFRAA 146 06/04/2015 0816   Lab Results  Component Value Date   CHOL 197 11/24/2009   HDL 45 11/24/2009   LDLCALC 135* 11/24/2009   TRIG 85 11/24/2009   CHOLHDL 4.4 Ratio 11/24/2009   No results found for: HGBA1C Lab Results  Component Value Date   VITAMINB12 375 06/04/2015   Lab Results  Component Value Date   TSH 2.830 06/04/2015      ASSESSMENT AND PLAN  Krista Maddox is a 29 y.o. female  with rapid weight gain, obesity, progestin implantation, presented with bilateral papillary edema,   Pseudotumor cerebri, lumbar puncture in July 13 demonstrated open pressure of 33 cm water, she continue has bilateral papillary edema. Keep Topamax 100 mg twice a day Maxalt as needed for moderate to severe headaches Continue follow-up with optometrist, visual field testing I have encouraged her continue exercise, weight loss Return to clinic in one month  Levert Feinstein, M.D. Ph.D.  [facility] [address] [place], [place] [address] Ph: [phone] Fax: [phone]

## 2015-07-21 NOTE — Telephone Encounter (Signed)
error 

## 2015-08-26 ENCOUNTER — Ambulatory Visit (INDEPENDENT_AMBULATORY_CARE_PROVIDER_SITE_OTHER): Payer: Commercial Indemnity | Admitting: Neurology

## 2015-08-26 ENCOUNTER — Encounter: Payer: Self-pay | Admitting: Neurology

## 2015-08-26 VITALS — BP 118/77 | HR 82 | Ht 65.0 in | Wt 231.0 lb

## 2015-08-26 DIAGNOSIS — E669 Obesity, unspecified: Secondary | ICD-10-CM | POA: Diagnosis not present

## 2015-08-26 DIAGNOSIS — G932 Benign intracranial hypertension: Secondary | ICD-10-CM | POA: Diagnosis not present

## 2015-08-26 MED ORDER — TOPIRAMATE 100 MG PO TABS
ORAL_TABLET | ORAL | Status: DC
Start: 2015-08-26 — End: 2015-08-27

## 2015-08-26 MED ORDER — RIZATRIPTAN BENZOATE 10 MG PO TBDP: 10.0000 mg | ORAL_TABLET | ORAL | Status: DC | PRN

## 2015-08-26 NOTE — Progress Notes (Signed)
Chief Complaint  Patient presents with  . Pseudotumor Cerebri    She has been having daily headaches for the last two weeks.  The headaches vary in severity.  She rarely uses rizatriptan but one dose tends to provide some improvement with the pain.  She has recently seen her optometrist and will getting glasses this week.      PATIENT: Krista Maddox DOB: Nov 14, 1986  Chief Complaint  Patient presents with  . Pseudotumor Cerebri    She has been having daily headaches for the last two weeks.  The headaches vary in severity.  She rarely uses rizatriptan but one dose tends to provide some improvement with the pain.  She has recently seen her optometrist and will getting glasses this week.    HISTORICAL  Krista Maddox is a 29 years old right-handed female, seen in consultation by his ophthalmologist Dr. London Sheer for evaluation of bilateral papillary edema.  I reviewed notes from Dr. Shea Evans in May 28 2015, patient was seen for complains of blurry vision, especially seen distance, on examination, visual acuity right eye 20/25 minus, left 20/30 plus, bilateral papillary edema   She denied previous history of headache, since May 2016, she began to notice frequent headaches, bilateral retro-orbital area constant pressure, 8 out of 10 on daily basis, sometimes it would exacerbate to a more severe 10 out of 10 pounding headaches, with associated light noise sensitivity, nauseous, bending down or getting up quickly also causing her dizzines.  She has been taking up to 12 tablets of ibuprofen each day, which only temporarily helps her headaches,  She received etonogestrel (NEXPLANON) 68 MG IMPL implant in July 14th 2016. She complains of 25 pound weight gain since last year.  UPDATE July 28th 2016: We have reviewed MRI brain: Enlarged partially empty sella. Enlarged optic nerve sheaths. Findings are non-specific but can be seen in association with idiopathic intracranial hypertension.No  acute findings. No masses or hydrocephalus.   She had a fluoroscopy guided lumbar puncture in June 25 2015, was open pressure 33 cm water, she has developed post LP headaches, eventually had blood patch in June 30 2015, with immediate relief of her headaches  Spinal fluid testing showed total protein 43, glucose 53, WBC 20, RBC 1, 72% lymphocytes, 24% monocytes  She continue has mild blurry vision, occasionally double vision on extreme gaze to the left or right, low-grade bifrontal pressure headaches, she has tried Maxalt as needed, which has been helpful for moderate and severe headaches. She is tolerating Topamax 100 mg twice a day well  UPDATE August 26 2015: She had LP in June 25 2015, open pressure was 33 cm H2O, CSF showed elevated WBC of 20, 70% of lymphocyte, RBC was 1, she developed post LP headaches, severe positional headaches eventually relieved by blood patch June 30 2015, somewhat responsive to Fioricet , with her increased headaches, she also noticed with sudden positional change, she had a worsening headaches, need to refocus  She is able to tolerate Topamax 100 mg twice a day, goes to school, stay up late at night, has tried exercise regularly  She began to have recurrent headaches since September 2016, bilateral frontal temporal region severe pounding headache was associated light noise sensitivity, 7/10, sometimes 10 out of 10 severe headaches, Maxalt 5 mg as needed was somewhat helpful  She has gained 40 pounds since January 2016  NEXPLANON implant was put in 2015, had weight gain, ws taken out in Sep, 2016  She was evaluated  by her optometrist couple weeks ago, had a new prescription  REVIEW OF SYSTEMS: Full 14 system review of systems performed and notable only for headaches, back pain, eye pain, light sensitivity, cold intolerance, bruising easily, appetite change,  ALLERGIES: No Known Allergies  HOME MEDICATIONS: Current Outpatient Prescriptions  Medication Sig  Dispense Refill  . butalbital-acetaminophen-caffeine (FIORICET, ESGIC) 50-325-40 MG per tablet Take 1 tablet by mouth every 6 (six) hours as needed for headache. 30 tablet 0  . etonogestrel (NEXPLANON) 68 MG IMPL implant 1 each by Subdermal route once.    . fluticasone (FLONASE) 50 MCG/ACT nasal spray Place 1 spray into both nostrils daily. 16 g 0  . ibuprofen (ADVIL,MOTRIN) 200 MG tablet Take 200 mg by mouth every 6 (six) hours as needed.    . ondansetron (ZOFRAN) 4 MG tablet Take 1 tablet (4 mg total) by mouth every 8 (eight) hours as needed for nausea or vomiting. 30 tablet 0  . rizatriptan (MAXALT-MLT) 5 MG disintegrating tablet Take 1 tablet (5 mg total) by mouth as needed. May repeat in 2 hours if needed 15 tablet 6  . topiramate (TOPAMAX) 100 MG tablet Half tablet twice a day for 1 week, then 1 tablet twice a day 60 tablet 11   No current facility-administered medications for this visit.    PAST MEDICAL HISTORY: Past Medical History  Diagnosis Date  . TTP (thrombotic thrombocytopenic purpura)   . Headache     PAST SURGICAL HISTORY: Past Surgical History  Procedure Laterality Date  . Cholecystectomy    . Cesarean section    . Wisdom tooth extraction      FAMILY HISTORY: Family History  Problem Relation Age of Onset  . Diabetes Mother   . Hypertension Mother   . Ovarian cancer Mother     Hysterectomy  . Ovarian cancer Maternal Grandmother     Hysterectomy  . Healthy Father     SOCIAL HISTORY:  Social History   Social History  . Marital Status: Single    Spouse Name: N/A  . Number of Children: 1  . Years of Education: In college   Occupational History  . Dispatcher    Social History Main Topics  . Smoking status: Current Every Day Smoker -- 0.50 packs/day  . Smokeless tobacco: Never Used  . Alcohol Use: No  . Drug Use: No  . Sexual Activity: Yes    Birth Control/ Protection: Implant   Other Topics Concern  . Not on file   Social History Narrative    Lives at home with her mother and daughter.   3-4 cups caffeine daily.   Left-handed.     PHYSICAL EXAM   Filed Vitals:   08/26/15 1105  BP: 118/77  Pulse: 82  Height:  (1.651 m)  Weight: 231 lb (104.781 kg)    Not recorded      Body mass index is 38.44 kg/(m^2).  PHYSICAL EXAMNIATION:  Gen: NAD, conversant, well nourised, obese, well groomed                     Cardiovascular: Regular rate rhythm, no peripheral edema, warm, nontender. Eyes: Conjunctivae clear without exudates or hemorrhage Neck: Supple, no carotid bruise. Pulmonary: Clear to auscultation bilaterally   NEUROLOGICAL EXAM:  MENTAL STATUS: Speech:    Speech is normal; fluent and spontaneous with normal comprehension.  Cognition:    The patient is oriented to person, place, and time;     recent and remote memory intact;  language fluent;     normal attention, concentration,     fund of knowledge.  CRANIAL NERVES: CN II: Visual fields are full to confrontation. Fundoscopic exam showed mild blurry at at bilateral disc, I was not able to appreciate venous pulsations bilaterally. Pupil equal round reactive to light, end gaze horizontal nystagmus CN III, IV, VI: extraocular movement are normal. No ptosis. CN V: Facial sensation is intact to pinprick in all 3 divisions bilaterally. Corneal responses are intact.  CN VII: Face is symmetric with normal eye closure and smile. CN VIII: Hearing is normal to rubbing fingers CN IX, X: Palate elevates symmetrically. Phonation is normal. CN XI: Head turning and shoulder shrug are intact CN XII: Tongue is midline with normal movements and no atrophy.  MOTOR: There is no pronator drift of out-stretched arms. Muscle bulk and tone are normal. Muscle strength is normal.  REFLEXES: Reflexes are 2+ and symmetric at the biceps, triceps, knees, and ankles. Plantar responses are flexor.  SENSORY: Light touch, pinprick, position sense, and vibration sense are  intact in fingers and toes.  COORDINATION: Rapid alternating movements and fine finger movements are intact. There is no dysmetria on finger-to-nose and heel-knee-shin.   GAIT/STANCE: Posture is normal. Gait is steady with normal steps, base, arm swing, and turning. Heel and toe walking are normal. Tandem gait is normal.  Romberg is absent.   DIAGNOSTIC DATA (LABS, IMAGING, TESTING) - I reviewed patient records, labs, notes, testing and imaging myself where available.  Lab Results  Component Value Date   WBC 9.7 03/13/2012   HGB 13.2 03/13/2012   HCT 39.2 03/13/2012   MCV 90.1 03/13/2012   PLT 259 03/13/2012      Component Value Date/Time   NA 144 06/04/2015 0816   NA 136 03/13/2012 0123   K 4.9 06/04/2015 0816   CL 103 06/04/2015 0816   CO2 22 06/04/2015 0816   GLUCOSE 71 06/04/2015 0816   GLUCOSE 87 03/13/2012 0123   BUN 12 06/04/2015 0816   BUN 12 03/13/2012 0123   CREATININE 0.57 06/04/2015 0816   CALCIUM 9.2 06/04/2015 0816   PROT 7.1 06/04/2015 0816   PROT 7.4 03/13/2012 0123   ALBUMIN 3.5 03/13/2012 0123   AST 12 06/04/2015 0816   ALT 13 06/04/2015 0816   ALKPHOS 70 06/04/2015 0816   BILITOT 0.2 06/04/2015 0816   BILITOT 0.2* 03/13/2012 0123   GFRNONAA 127 06/04/2015 0816   GFRAA 146 06/04/2015 0816   Lab Results  Component Value Date   CHOL 197 11/24/2009   HDL 45 11/24/2009   LDLCALC 135* 11/24/2009   TRIG 85 11/24/2009   CHOLHDL 4.4 Ratio 11/24/2009   No results found for: HGBA1C Lab Results  Component Value Date   VITAMINB12 375 06/04/2015   Lab Results  Component Value Date   TSH 2.830 06/04/2015      ASSESSMENT AND PLAN  Krista Maddox is a 29 y.o. female  with rapid weight gain, obesity, progestin implantation, presented with bilateral papillary edema,   Pseudotumor cerebri  Continued evidence of mild bilateral papillary edema  I have suggested lumbar puncture, but she was worried about post LP headache with her previous  experience,  Will increase Topamax 150 mg twice a day  I have encouraged her continue moderate exercise, weight loss  Increase Maxalt 10 mg as needed  Return to clinic in 3-4 weeks  Call office for worsening headaches, and visual loss   If she continue has worsening headaches, may consider  lumbar puncture at office  Levert Feinstein, M.D. Ph.D.  [facility] [address], [address] [place], [place] [address] Ph: [phone] Fax: [phone]

## 2015-08-27 ENCOUNTER — Other Ambulatory Visit: Payer: Self-pay

## 2015-08-27 MED ORDER — TOPIRAMATE 100 MG PO TABS: 150.0000 mg | ORAL_TABLET | Freq: Two times a day (BID) | ORAL | Status: DC

## 2015-08-27 NOTE — Telephone Encounter (Signed)
Previous transmission failed   topiramate (TOPAMAX) 100 MG tablet 90 tablet 11 08/26/2015      Sig: One and 1/2 tabs twice a day    E-Prescribing Status: Transmission to pharmacy failed (08/26/2015 11:32 AM EDT)

## 2015-09-16 ENCOUNTER — Ambulatory Visit (INDEPENDENT_AMBULATORY_CARE_PROVIDER_SITE_OTHER): Payer: Commercial Indemnity | Admitting: Neurology

## 2015-09-16 ENCOUNTER — Encounter: Payer: Self-pay | Admitting: Neurology

## 2015-09-16 VITALS — BP 124/84 | HR 103 | Ht 65.0 in | Wt 231.0 lb

## 2015-09-16 DIAGNOSIS — E669 Obesity, unspecified: Secondary | ICD-10-CM | POA: Diagnosis not present

## 2015-09-16 DIAGNOSIS — G932 Benign intracranial hypertension: Secondary | ICD-10-CM

## 2015-09-16 NOTE — Progress Notes (Signed)
Chief Complaint  Patient presents with  . Pseudotumor Cerebri    Reports her headaches have improved and she estimates having only one headache day per week.  She has not had to use Maxalt recently.  She has not had any vision difficulty.      PATIENT: Krista Maddox DOB: [birth year]  Chief Complaint  Patient presents with  . Pseudotumor Cerebri    Reports her headaches have improved and she estimates having only one headache day per week.  She has not had to use Maxalt recently.  She has not had any vision difficulty.    HISTORICAL  Krista Maddox is a 29 years old right-handed female, seen in consultation by his ophthalmologist Dr. London Sheer for evaluation of bilateral papillary edema.  I reviewed notes from Dr. Shea Evans in May 28 2015, patient was seen for complains of blurry vision, especially seen distance, on examination, visual acuity right eye 20/25 minus, left 20/30 plus, bilateral papillary edema   She denied previous history of headache, since May 2016, she began to notice frequent headaches, bilateral retro-orbital area constant pressure, 8 out of 10 on daily basis, sometimes it would exacerbate to a more severe 10 out of 10 pounding headaches, with associated light noise sensitivity, nauseous, bending down or getting up quickly also causing her dizzines.  She has been taking up to 12 tablets of ibuprofen each day, which only temporarily helps her headaches,  She received etonogestrel (NEXPLANON) 68 MG IMPL implant in July 14th 2016. She complains of 25 pound weight gain since last year.  UPDATE July 28th 2016: We have reviewed MRI brain: Enlarged partially empty sella. Enlarged optic nerve sheaths. Findings are non-specific but can be seen in association with idiopathic intracranial hypertension.No acute findings. No masses or hydrocephalus.   She had a fluoroscopy guided lumbar puncture in June 25 2015, was open pressure 33 cm water, she has developed post LP  headaches, eventually had blood patch in June 30 2015, with immediate relief of her headaches  Spinal fluid testing showed total protein 43, glucose 53, WBC 20, RBC 1, 72% lymphocytes, 24% monocytes  She continue has mild blurry vision, occasionally double vision on extreme gaze to the left or right, low-grade bifrontal pressure headaches, she has tried Maxalt as needed, which has been helpful for moderate and severe headaches. She is tolerating Topamax 100 mg twice a day well  UPDATE August 26 2015: She had LP in June 25 2015, open pressure was 33 cm H2O, CSF showed elevated WBC of 20, 70% of lymphocyte, RBC was 1, she developed post LP headaches, severe positional headaches eventually relieved by blood patch June 30 2015, somewhat responsive to Fioricet , with her increased headaches, she also noticed with sudden positional change, she had a worsening headaches, need to refocus  She is able to tolerate Topamax 100 mg twice a day, goes to school, stay up late at night, has tried exercise regularly  She began to have recurrent headaches since September 2016, bilateral frontal temporal region severe pounding headache was associated light noise sensitivity, 7/10, sometimes 10 out of 10 severe headaches, Maxalt 5 mg as needed was somewhat helpful  She has gained 40 pounds since January 2016  NEXPLANON implant was put in 2015, had weight gain, ws taken out in Sep, 2016  She was evaluated by her optometrist couple weeks ago, had a new prescription  UPDATE Sep 16 2015: She continue has at least 1 moderate to severe headache each week, bilateral  retro-orbital area, pounding, with light noise sensitivity, Maxalt 10 mg as needed only provide limited help, her weight has been stable at 230 pounds, she is taking Topamax 150 mg twice a day, complains of numbness tingling at her fingertips  REVIEW OF SYSTEMS: Full 14 system review of systems performed and notable only for cold intolerance, appetite  change ALLERGIES: No Known Allergies  HOME MEDICATIONS: Current Outpatient Prescriptions  Medication Sig Dispense Refill  . butalbital-acetaminophen-caffeine (FIORICET, ESGIC) 50-325-40 MG per tablet Take 1 tablet by mouth every 6 (six) hours as needed for headache. 30 tablet 0  . etonogestrel (NEXPLANON) 68 MG IMPL implant 1 each by Subdermal route once.    . fluticasone (FLONASE) 50 MCG/ACT nasal spray Place 1 spray into both nostrils daily. 16 g 0  . ibuprofen (ADVIL,MOTRIN) 200 MG tablet Take 200 mg by mouth every 6 (six) hours as needed.    . ondansetron (ZOFRAN) 4 MG tablet Take 1 tablet (4 mg total) by mouth every 8 (eight) hours as needed for nausea or vomiting. 30 tablet 0  . rizatriptan (MAXALT-MLT) 5 MG disintegrating tablet Take 1 tablet (5 mg total) by mouth as needed. May repeat in 2 hours if needed 15 tablet 6  . topiramate (TOPAMAX) 100 MG tablet Half tablet twice a day for 1 week, then 1 tablet twice a day 60 tablet 11   No current facility-administered medications for this visit.    PAST MEDICAL HISTORY: Past Medical History  Diagnosis Date  . TTP (thrombotic thrombocytopenic purpura) (HCC)   . Headache     PAST SURGICAL HISTORY: Past Surgical History  Procedure Laterality Date  . Cholecystectomy    . Cesarean section    . Wisdom tooth extraction      FAMILY HISTORY: Family History  Problem Relation Age of Onset  . Diabetes Mother   . Hypertension Mother   . Ovarian cancer Mother     Hysterectomy  . Ovarian cancer Maternal Grandmother     Hysterectomy  . Healthy Father     SOCIAL HISTORY:  Social History   Social History  . Marital Status: Single    Spouse Name: N/A  . Number of Children: 1  . Years of Education: In college   Occupational History  . Dispatcher    Social History Main Topics  . Smoking status: Current Every Day Smoker -- 0.50 packs/day  . Smokeless tobacco: Never Used  . Alcohol Use: No  . Drug Use: No  . Sexual Activity:  Yes    Birth Control/ Protection: Implant   Other Topics Concern  . Not on file   Social History Narrative   Lives at home with her mother and daughter.   3-4 cups caffeine daily.   Left-handed.     PHYSICAL EXAM   Filed Vitals:   09/16/15 0930  BP: 124/84  Pulse: 103  Height:  (1.651 m)  Weight: 231 lb (104.781 kg)    Not recorded      Body mass index is 38.44 kg/(m^2).  PHYSICAL EXAMNIATION:  Gen: NAD, conversant, well nourised, obese, well groomed                     Cardiovascular: Regular rate rhythm, no peripheral edema, warm, nontender. Eyes: Conjunctivae clear without exudates or hemorrhage Neck: Supple, no carotid bruise. Pulmonary: Clear to auscultation bilaterally   NEUROLOGICAL EXAM:  MENTAL STATUS: Speech:    Speech is normal; fluent and spontaneous with normal comprehension.  Cognition:  The patient is oriented to person, place, and time;     recent and remote memory intact;     language fluent;     normal attention, concentration,     fund of knowledge.  CRANIAL NERVES: CN II: Visual fields are full to confrontation. Fundoscopic exam showed mild blurry at at bilateral disc, I was not able to appreciate venous pulsations bilaterally. Pupil equal round reactive to light, end gaze horizontal nystagmus, mild bilateral exophoria CN III, IV, VI: extraocular movement are normal. No ptosis. CN V: Facial sensation is intact to pinprick in all 3 divisions bilaterally. Corneal responses are intact.  CN VII: Face is symmetric with normal eye closure and smile. CN VIII: Hearing is normal to rubbing fingers CN IX, X: Palate elevates symmetrically. Phonation is normal. CN XI: Head turning and shoulder shrug are intact CN XII: Tongue is midline with normal movements and no atrophy.  MOTOR: There is no pronator drift of out-stretched arms. Muscle bulk and tone are normal. Muscle strength is normal.  REFLEXES: Reflexes are 2+ and symmetric at the  biceps, triceps, knees, and ankles. Plantar responses are flexor.  SENSORY: Light touch, pinprick, position sense, and vibration sense are intact in fingers and toes.  COORDINATION: Rapid alternating movements and fine finger movements are intact. There is no dysmetria on finger-to-nose and heel-knee-shin.   GAIT/STANCE: Posture is normal. Gait is steady with normal steps, base, arm swing, and turning. Heel and toe walking are normal. Tandem gait is normal.  Romberg is absent.   DIAGNOSTIC DATA (LABS, IMAGING, TESTING) - I reviewed patient records, labs, notes, testing and imaging myself where available.  Lab Results  Component Value Date   WBC 9.7 03/13/2012   HGB 13.2 03/13/2012   HCT 39.2 03/13/2012   MCV 90.1 03/13/2012   PLT 259 03/13/2012      Component Value Date/Time   NA 144 06/04/2015 0816   NA 136 03/13/2012 0123   K 4.9 06/04/2015 0816   CL 103 06/04/2015 0816   CO2 22 06/04/2015 0816   GLUCOSE 71 06/04/2015 0816   GLUCOSE 87 03/13/2012 0123   BUN 12 06/04/2015 0816   BUN 12 03/13/2012 0123   CREATININE 0.57 06/04/2015 0816   CALCIUM 9.2 06/04/2015 0816   PROT 7.1 06/04/2015 0816   PROT 7.4 03/13/2012 0123   ALBUMIN 3.5 03/13/2012 0123   AST 12 06/04/2015 0816   ALT 13 06/04/2015 0816   ALKPHOS 70 06/04/2015 0816   BILITOT 0.2 06/04/2015 0816   BILITOT 0.2* 03/13/2012 0123   GFRNONAA 127 06/04/2015 0816   GFRAA 146 06/04/2015 0816   Lab Results  Component Value Date   CHOL 197 11/24/2009   HDL 45 11/24/2009   LDLCALC 135* 11/24/2009   TRIG 85 11/24/2009   CHOLHDL 4.4 Ratio 11/24/2009   No results found for: HGBA1C Lab Results  Component Value Date   VITAMINB12 375 06/04/2015   Lab Results  Component Value Date   TSH 2.830 06/04/2015      ASSESSMENT AND PLAN  Krista Maddox is a 29 y.o. female  with rapid weight gain, obesity, progestin implantation, presented with bilateral papillary edema,   Pseudotumor cerebri  Continued  evidence of mild bilateral papillary edema  Despite higher dose of Topamax 150 mg twice a day   Maxalt 10 mg as needed  Return to clinic in October 17 at 1 PM for repeat lumbar puncture,   Levert Feinstein, M.D. Ph.D.  [facility] [address]  [address] [place], [place] [address] Ph: [phone] Fax: [phone]

## 2015-09-29 ENCOUNTER — Ambulatory Visit (INDEPENDENT_AMBULATORY_CARE_PROVIDER_SITE_OTHER): Payer: Commercial Indemnity | Admitting: Neurology

## 2015-09-29 ENCOUNTER — Other Ambulatory Visit: Payer: Self-pay | Admitting: Neurology

## 2015-09-29 ENCOUNTER — Other Ambulatory Visit: Payer: Self-pay | Admitting: *Deleted

## 2015-09-29 ENCOUNTER — Telehealth: Payer: Self-pay | Admitting: Neurology

## 2015-09-29 ENCOUNTER — Ambulatory Visit
Admission: RE | Admit: 2015-09-29 | Discharge: 2015-09-29 | Disposition: A | Discharge status: Home / Self Care | Payer: Managed Care, Other (non HMO) | Source: Ambulatory Visit | Attending: Neurology | Admitting: Neurology

## 2015-09-29 ENCOUNTER — Encounter: Payer: Self-pay | Admitting: Neurology

## 2015-09-29 VITALS — BP 132/82 | HR 78 | Ht 65.0 in | Wt 234.0 lb

## 2015-09-29 DIAGNOSIS — G932 Benign intracranial hypertension: Secondary | ICD-10-CM

## 2015-09-29 DIAGNOSIS — G971 Other reaction to spinal and lumbar puncture: Secondary | ICD-10-CM

## 2015-09-29 NOTE — Telephone Encounter (Addendum)
I have called in September 30 2015, she complains of low back pain, but no post LP headaches.  Marcelino DusterMichelle, give her a follow up visit in one month

## 2015-09-29 NOTE — Progress Notes (Signed)
Chief Complaint  Patient presents with  . Pseudotumor Cerebri    Lumbar Puncture      PATIENT: Krista Maddox DOB: [birth year]  Chief Complaint  Patient presents with  . Pseudotumor Cerebri    Lumbar Puncture    HISTORICAL  Krista Maddox is a 29 years old right-handed female, seen in consultation by his ophthalmologist Dr. London Sheer for evaluation of bilateral papillary edema.  I reviewed notes from Dr. Shea Evans in May 28 2015, patient was seen for complains of blurry vision, especially seen distance, on examination, visual acuity right eye 20/25 minus, left 20/30 plus, bilateral papillary edema   She denied previous history of headache, since May 2016, she began to notice frequent headaches, bilateral retro-orbital area constant pressure, 8 out of 10 on daily basis, sometimes it would exacerbate to a more severe 10 out of 10 pounding headaches, with associated light noise sensitivity, nauseous, bending down or getting up quickly also causing her dizzines.  She has been taking up to 12 tablets of ibuprofen each day, which only temporarily helps her headaches,  She received etonogestrel (NEXPLANON) 68 MG IMPL implant in July 14th 2016. She complains of 25 pound weight gain since last year.  UPDATE July 28th 2016: We have reviewed MRI brain: Enlarged partially empty sella. Enlarged optic nerve sheaths. Findings are non-specific but can be seen in association with idiopathic intracranial hypertension.No acute findings. No masses or hydrocephalus.   She had a fluoroscopy guided lumbar puncture in June 25 2015, was open pressure 33 cm water, she has developed post LP headaches, eventually had blood patch in June 30 2015, with immediate relief of her headaches  Spinal fluid testing showed total protein 43, glucose 53, WBC 20, RBC 1, 72% lymphocytes, 24% monocytes  She continue has mild blurry vision, occasionally double vision on extreme gaze to the left or right, low-grade  bifrontal pressure headaches, she has tried Maxalt as needed, which has been helpful for moderate and severe headaches. She is tolerating Topamax 100 mg twice a day well  UPDATE August 26 2015: She had LP in June 25 2015, open pressure was 33 cm H2O, CSF showed elevated WBC of 20, 70% of lymphocyte, RBC was 1, she developed post LP headaches, severe positional headaches eventually relieved by blood patch June 30 2015, somewhat responsive to Fioricet , with her increased headaches, she also noticed with sudden positional change, she had a worsening headaches, need to refocus  She is able to tolerate Topamax 100 mg twice a day, goes to school, stay up late at night, has tried exercise regularly  She began to have recurrent headaches since September 2016, bilateral frontal temporal region severe pounding headache was associated light noise sensitivity, 7/10, sometimes 10 out of 10 severe headaches, Maxalt 5 mg as needed was somewhat helpful  She has gained 40 pounds since January 2016  NEXPLANON implant was put in 2015, had weight gain, ws taken out in Sep, 2016  She was evaluated by her optometrist couple weeks ago, had a new prescription  UPDATE Sep 16 2015: She continue has at least 1 moderate to severe headache each week, bilateral retro-orbital area, pounding, with light noise sensitivity, Maxalt 10 mg as needed only provide limited help, her weight has been stable at 230 pounds, she is taking Topamax 150 mg twice a day, complains of numbness tingling at her fingertips  Update September 29 2015: She continue complaining worsening headaches, almost daily headaches, despite in taking Topamax 150 mg twice  a day, blurry vision, continued evidence of bilateral papillary edema under funduscopy examinations.  I attempted LP without succeed at office, she is referred to fluoroscopy guided LP, open pressure was 26 cm water,  She develop post LP headache following last lumbar puncture, I have advised her  to lie flat resting, as needed Fioricet, callback office for worsening headache,  If she require repeat lumbar puncture, may consider early referral for LP shunt REVIEW OF SYSTEMS: Full 14 system review of systems performed and notable only for cold intolerance, appetite change ALLERGIES: No Known Allergies  HOME MEDICATIONS: Current Outpatient Prescriptions  Medication Sig Dispense Refill  . butalbital-acetaminophen-caffeine (FIORICET, ESGIC) 50-325-40 MG per tablet Take 1 tablet by mouth every 6 (six) hours as needed for headache. 30 tablet 0  . etonogestrel (NEXPLANON) 68 MG IMPL implant 1 each by Subdermal route once.    . fluticasone (FLONASE) 50 MCG/ACT nasal spray Place 1 spray into both nostrils daily. 16 g 0  . ibuprofen (ADVIL,MOTRIN) 200 MG tablet Take 200 mg by mouth every 6 (six) hours as needed.    . ondansetron (ZOFRAN) 4 MG tablet Take 1 tablet (4 mg total) by mouth every 8 (eight) hours as needed for nausea or vomiting. 30 tablet 0  . rizatriptan (MAXALT-MLT) 5 MG disintegrating tablet Take 1 tablet (5 mg total) by mouth as needed. May repeat in 2 hours if needed 15 tablet 6  . topiramate (TOPAMAX) 100 MG tablet Half tablet twice a day for 1 week, then 1 tablet twice a day 60 tablet 11   No current facility-administered medications for this visit.    PAST MEDICAL HISTORY: Past Medical History  Diagnosis Date  . TTP (thrombotic thrombocytopenic purpura) (HCC)   . Headache     PAST SURGICAL HISTORY: Past Surgical History  Procedure Laterality Date  . Cholecystectomy    . Cesarean section    . Wisdom tooth extraction      FAMILY HISTORY: Family History  Problem Relation Age of Onset  . Diabetes Mother   . Hypertension Mother   . Ovarian cancer Mother     Hysterectomy  . Ovarian cancer Maternal Grandmother     Hysterectomy  . Healthy Father     SOCIAL HISTORY:  Social History   Social History  . Marital Status: Single    Spouse Name: N/A  . Number  of Children: 1  . Years of Education: In college   Occupational History  . Dispatcher    Social History Main Topics  . Smoking status: Current Every Day Smoker -- 0.50 packs/day  . Smokeless tobacco: Never Used  . Alcohol Use: No  . Drug Use: No  . Sexual Activity: Yes    Birth Control/ Protection: Implant   Other Topics Concern  . Not on file   Social History Narrative   Lives at home with her mother and daughter.   3-4 cups caffeine daily.   Left-handed.     PHYSICAL EXAM   Filed Vitals:   09/29/15 1304  Height: 5\' 5"  (1.651 m)    Not recorded      There is no weight on file to calculate BMI.  PHYSICAL EXAMNIATION:  Gen: NAD, conversant, well nourised, obese, well groomed                     Cardiovascular: Regular rate rhythm, no peripheral edema, warm, nontender. Eyes: Conjunctivae clear without exudates or hemorrhage Neck: Supple, no carotid bruise. Pulmonary: Clear to auscultation  bilaterally   NEUROLOGICAL EXAM:  MENTAL STATUS: Speech:    Speech is normal; fluent and spontaneous with normal comprehension.  Cognition:    The patient is oriented to person, place, and time;     recent and remote memory intact;     language fluent;     normal attention, concentration,     fund of knowledge.  CRANIAL NERVES: CN II: Visual fields are full to confrontation. Fundoscopic exam showed mild blurry at at bilateral disc, I was not able to appreciate venous pulsations bilaterally. Pupil equal round reactive to light, end gaze horizontal nystagmus, mild bilateral exophoria CN III, IV, VI: extraocular movement are normal. No ptosis. CN V: Facial sensation is intact to pinprick in all 3 divisions bilaterally. Corneal responses are intact.  CN VII: Face is symmetric with normal eye closure and smile. CN VIII: Hearing is normal to rubbing fingers CN IX, X: Palate elevates symmetrically. Phonation is normal. CN XI: Head turning and shoulder shrug are intact CN XII:  Tongue is midline with normal movements and no atrophy.  MOTOR: There is no pronator drift of out-stretched arms. Muscle bulk and tone are normal. Muscle strength is normal.  REFLEXES: Reflexes are 2+ and symmetric at the biceps, triceps, knees, and ankles. Plantar responses are flexor.  SENSORY: Light touch, pinprick, position sense, and vibration sense are intact in fingers and toes.  COORDINATION: Rapid alternating movements and fine finger movements are intact. There is no dysmetria on finger-to-nose and heel-knee-shin.   GAIT/STANCE: Posture is normal. Gait is steady with normal steps, base, arm swing, and turning. Heel and toe walking are normal. Tandem gait is normal.  Romberg is absent.   DIAGNOSTIC DATA (LABS, IMAGING, TESTING) - I reviewed patient records, labs, notes, testing and imaging myself where available.  Lab Results  Component Value Date   WBC 9.7 03/13/2012   HGB 13.2 03/13/2012   HCT 39.2 03/13/2012   MCV 90.1 03/13/2012   PLT 259 03/13/2012      Component Value Date/Time   NA 144 06/04/2015 0816   NA 136 03/13/2012 0123   K 4.9 06/04/2015 0816   CL 103 06/04/2015 0816   CO2 22 06/04/2015 0816   GLUCOSE 71 06/04/2015 0816   GLUCOSE 87 03/13/2012 0123   BUN 12 06/04/2015 0816   BUN 12 03/13/2012 0123   CREATININE 0.57 06/04/2015 0816   CALCIUM 9.2 06/04/2015 0816   PROT 7.1 06/04/2015 0816   PROT 7.4 03/13/2012 0123   ALBUMIN 4.2 06/04/2015 0816   ALBUMIN 3.5 03/13/2012 0123   AST 12 06/04/2015 0816   ALT 13 06/04/2015 0816   ALKPHOS 70 06/04/2015 0816   BILITOT 0.2 06/04/2015 0816   BILITOT 0.2* 03/13/2012 0123   GFRNONAA 127 06/04/2015 0816   GFRAA 146 06/04/2015 0816   Lab Results  Component Value Date   CHOL 197 11/24/2009   HDL 45 11/24/2009   LDLCALC 135* 11/24/2009   TRIG 85 11/24/2009   CHOLHDL 4.4 Ratio 11/24/2009   No results found for: HGBA1C Lab Results  Component Value Date   VITAMINB12 375 06/04/2015   Lab Results    Component Value Date   TSH 2.830 06/04/2015      ASSESSMENT AND PLAN  Krista Maddox is a 29 y.o. female  with rapid weight gain, obesity, progestin implantation, presented with bilateral papillary edema,   Pseudotumor cerebri  Continued evidence of mild bilateral papillary edema  continue Topamax 150 mg twice a day  I attempted LP  without success  She is referred to LP under fluro-guidance.  RTC in 1-2 months   Levert Feinstein, M.D. Ph.D.  [facility] [address], [address] [place], [place] [address] Ph: [phone] Fax: [phone]

## 2015-09-29 NOTE — Discharge Instructions (Signed)

## 2015-09-30 NOTE — Telephone Encounter (Signed)
Appointment scheduled for 11/03/15.

## 2015-09-30 NOTE — Telephone Encounter (Signed)
Left message for a return call

## 2015-11-03 ENCOUNTER — Ambulatory Visit (INDEPENDENT_AMBULATORY_CARE_PROVIDER_SITE_OTHER): Payer: Managed Care, Other (non HMO) | Admitting: Neurology

## 2015-11-03 ENCOUNTER — Other Ambulatory Visit: Payer: Self-pay | Admitting: *Deleted

## 2015-11-03 ENCOUNTER — Encounter: Payer: Self-pay | Admitting: Neurology

## 2015-11-03 VITALS — BP 130/75 | HR 87 | Ht 65.0 in | Wt 224.0 lb

## 2015-11-03 DIAGNOSIS — G932 Benign intracranial hypertension: Secondary | ICD-10-CM

## 2015-11-03 MED ORDER — NORTRIPTYLINE HCL 10 MG PO CAPS: ORAL_CAPSULE | ORAL | Status: DC

## 2015-11-03 NOTE — Progress Notes (Signed)
No chief complaint on file.     PATIENT: Krista Maddox DOB: 1986/01/15  No chief complaint on file.   HISTORICAL  Krista Maddox is a 29 years old right-handed female, seen in consultation by his ophthalmologist Dr. London Sheer for evaluation of bilateral papillary edema.  I reviewed notes from Dr. Shea Evans in May 28 2015, patient was seen for complains of blurry vision, especially seen distance, on examination, visual acuity right eye 20/25 minus, left 20/30 plus, bilateral papillary edema   She denied previous history of headache, since May 2016, she began to notice frequent headaches, bilateral retro-orbital area constant pressure, 8 out of 10 on daily basis, sometimes it would exacerbate to a more severe 10 out of 10 pounding headaches, with associated light noise sensitivity, nauseous, bending down or getting up quickly also causing her dizzines.  She has been taking up to 12 tablets of ibuprofen each day, which only temporarily helps her headaches,  She received etonogestrel (NEXPLANON) 68 MG IMPL implant in July 14th 2016. She complains of 25 pound weight gain since last year.  UPDATE July 28th 2016: We have reviewed MRI brain: Enlarged partially empty sella. Enlarged optic nerve sheaths. Findings are non-specific but can be seen in association with idiopathic intracranial hypertension.No acute findings. No masses or hydrocephalus.   She had a fluoroscopy guided lumbar puncture in June 25 2015, was open pressure 33 cm water, she has developed post LP headaches, eventually had blood patch in June 30 2015, with immediate relief of her headaches  Spinal fluid testing showed total protein 43, glucose 53, WBC 20, RBC 1, 72% lymphocytes, 24% monocytes  She continue has mild blurry vision, occasionally double vision on extreme gaze to the left or right, low-grade bifrontal pressure headaches, she has tried Maxalt as needed, which has been helpful for moderate and severe headaches.  She is tolerating Topamax 100 mg twice a day well  UPDATE August 26 2015: She had LP in June 25 2015, open pressure was 33 cm H2O, CSF showed elevated WBC of 20, 70% of lymphocyte, RBC was 1, she developed post LP headaches, severe positional headaches eventually relieved by blood patch June 30 2015, somewhat responsive to Fioricet , with her increased headaches, she also noticed with sudden positional change, she had a worsening headaches, need to refocus  She is able to tolerate Topamax 100 mg twice a day, goes to school, stay up late at night, has tried exercise regularly  She began to have recurrent headaches since September 2016, bilateral frontal temporal region severe pounding headache was associated light noise sensitivity, 7/10, sometimes 10 out of 10 severe headaches, Maxalt 5 mg as needed was somewhat helpful  She has gained 40 pounds since January 2016  NEXPLANON implant was put in 2015, had weight gain, ws taken out in Sep, 2016  She was evaluated by her optometrist couple weeks ago, had a new prescription  UPDATE Sep 16 2015: She continue has at least 1 moderate to severe headache each week, bilateral retro-orbital area, pounding, with light noise sensitivity, Maxalt 10 mg as needed only provide limited help, her weight has been stable at 230 pounds, she is taking Topamax 150 mg twice a day, complains of numbness tingling at her fingertips  UPDATE Nov 03 2015: She had repeat in Sep 29 2015, showed Open pressure is 26 cm H2O. 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 butalbital-acetaminophen-caffeine (FIORICET, ESGIC) 50-325-40 MG per tablet Take 1 tablet by mouth every 6 (six) hours as needed for headache. 30 tablet 0  . etonogestrel (NEXPLANON) 68 MG IMPL implant 1 each by Subdermal route once.    . fluticasone (FLONASE) 50 MCG/ACT nasal spray Place 1 spray into both nostrils daily. 16 g 0  . ibuprofen (ADVIL,MOTRIN) 200 MG tablet Take 200 mg by mouth every 6 (six) hours as needed.    . ondansetron (ZOFRAN) 4 MG tablet Take 1 tablet (4 mg total) by mouth every 8 (eight) hours as needed for nausea or vomiting. 30 tablet 0  . rizatriptan (MAXALT-MLT) 5 MG disintegrating tablet Take 1 tablet (5 mg total) by mouth as needed. May repeat in 2 hours if needed 15 tablet 6  . topiramate (TOPAMAX) 100 MG tablet Half tablet twice a day for 1 week, then 1 tablet twice a day 60 tablet 11   No current facility-administered medications for this visit.    PAST MEDICAL HISTORY: Past Medical History  Diagnosis Date  . TTP (thrombotic thrombocytopenic purpura) (HCC)   . Headache     PAST SURGICAL HISTORY: Past Surgical History  Procedure Laterality Date  . Cholecystectomy    . Cesarean section    . Wisdom tooth extraction      FAMILY HISTORY: Family History  Problem Relation Age of Onset  . Diabetes Mother   . Hypertension Mother   . Ovarian cancer Mother     Hysterectomy  . Ovarian cancer Maternal Grandmother     Hysterectomy  . Healthy Father     SOCIAL HISTORY:  Social History   Social History  . Marital Status: Single    Spouse Name: N/A  . Number of Children: 1  . Years of Education: In college   Occupational History  . Dispatcher    Social History Main Topics  . Smoking status: Current Every Day Smoker -- 0.50 packs/day  .  Smokeless tobacco: Never Used  . Alcohol Use: No  . Drug Use: No  . Sexual Activity: Yes    Birth Control/ Protection: Implant   Other Topics Concern  . Not on file   Social History Narrative   Lives at home with her mother and daughter.   3-4 cups caffeine daily.   Left-handed.     PHYSICAL EXAM   There were no vitals filed for this visit.  Not recorded      There is no weight on file to calculate BMI.  PHYSICAL EXAMNIATION:  Gen: NAD, conversant, well nourised, obese, well groomed                     Cardiovascular: Regular rate rhythm, no peripheral edema, warm, nontender. Eyes: Conjunctivae clear without exudates or hemorrhage Neck: Supple, no carotid bruise. Pulmonary: Clear to auscultation bilaterally   NEUROLOGICAL EXAM:  MENTAL STATUS: Speech:    Speech is normal; fluent and spontaneous with normal comprehension.  Cognition:  The patient is oriented to person, place, and time;     recent and remote memory intact;     language fluent;     normal attention, concentration,     fund of knowledge.  CRANIAL NERVES: CN II: Visual fields are full to confrontation. Fundoscopic exam showed mild blurry at at bilateral disc, I was not able to appreciate venous pulsations bilaterally. Pupil equal round reactive to light, end gaze horizontal nystagmus, mild bilateral exophoria CN III, IV, VI: extraocular movement are normal. No ptosis. CN V: Facial sensation is intact to pinprick in all 3 divisions bilaterally. Corneal responses are intact.  CN VII: Face is symmetric with normal eye closure and smile. CN VIII: Hearing is normal to rubbing fingers CN IX, X: Palate elevates symmetrically. Phonation is normal. CN XI: Head turning and shoulder shrug are intact CN XII: Tongue is midline with normal movements and no atrophy.  MOTOR: There is no pronator drift of out-stretched arms. Muscle bulk and tone are normal. Muscle strength is normal.  REFLEXES: Reflexes are 2+  and symmetric at the biceps, triceps, knees, and ankles. Plantar responses are flexor.  SENSORY: Light touch, pinprick, position sense, and vibration sense are intact in fingers and toes.  COORDINATION: Rapid alternating movements and fine finger movements are intact. There is no dysmetria on finger-to-nose and heel-knee-shin.   GAIT/STANCE: Posture is normal. Gait is steady with normal steps, base, arm swing, and turning. Heel and toe walking are normal. Tandem gait is normal.  Romberg is absent.   DIAGNOSTIC DATA (LABS, IMAGING, TESTING) - I reviewed patient records, labs, notes, testing and imaging myself where available.   Lab Results  Component Value Date   CHOL 197 11/24/2009   HDL 45 11/24/2009   LDLCALC 135* 11/24/2009   TRIG 85 11/24/2009   CHOLHDL 4.4 Ratio 11/24/2009   No results found for: HGBA1C Lab Results  Component Value Date   VITAMINB12 375 06/04/2015   Lab Results  Component Value Date   TSH 2.830 06/04/2015      ASSESSMENT AND PLAN  Salomon FickHeather L Carline is a 29 y.o. female  with rapid weight gain, obesity, progestin implantation, presented with bilateral papillary edema,   Pseudotumor cerebri  Continued evidence of mild bilateral papillary edema  Despite higher dose of Topamax 150 mg twice a day   Maxalt 10 mg as needed  If she continues to have evidence of papillary edema frequent headaches that next follow-up visit may consider repeat LP    Chronic headaches with migraine features  Add on nortriptyline as headache prevention     Levert FeinsteinYijun Davian Wollenberg, M.D. Ph.D.  [facility] [address], [address] [place], [place] [address] Ph: [phone]) [phone] Fax: [phone]

## 2016-01-05 ENCOUNTER — Encounter: Payer: Self-pay | Admitting: Neurology

## 2016-01-05 ENCOUNTER — Ambulatory Visit (INDEPENDENT_AMBULATORY_CARE_PROVIDER_SITE_OTHER): Payer: Managed Care, Other (non HMO) | Admitting: Neurology

## 2016-01-05 VITALS — BP 125/80 | HR 101 | Ht 65.0 in | Wt 219.0 lb

## 2016-01-05 DIAGNOSIS — H4710 Unspecified papilledema: Secondary | ICD-10-CM | POA: Diagnosis not present

## 2016-01-05 DIAGNOSIS — G932 Benign intracranial hypertension: Secondary | ICD-10-CM

## 2016-01-05 MED ORDER — NORTRIPTYLINE HCL 25 MG PO CAPS: 50.0000 mg | ORAL_CAPSULE | Freq: Every day | ORAL | Status: DC

## 2016-01-05 MED ORDER — TOPIRAMATE 100 MG PO TABS: 150.0000 mg | ORAL_TABLET | Freq: Two times a day (BID) | ORAL | Status: DC

## 2016-01-05 MED ORDER — RIZATRIPTAN BENZOATE 10 MG PO TBDP: 10.0000 mg | ORAL_TABLET | ORAL | Status: DC | PRN

## 2016-01-05 NOTE — Addendum Note (Signed)
Addended by: Levert Feinstein on: 01/05/2016 11:32 AM   Modules accepted: Level of Service

## 2016-01-05 NOTE — Progress Notes (Signed)
Chief Complaint  Patient presents with  . Pseudotumor Cerebri    Her headaches have worsened and she keeping constant pain in varying degrees of severity.    Chief Complaint  Patient presents with  . Pseudotumor Cerebri    Her headaches have worsened and she keeping constant pain in varying degrees of severity.      PATIENT: Krista Maddox DOB: 16-Sep-1986  Chief Complaint  Patient presents with  . Pseudotumor Cerebri    Her headaches have worsened and she keeping constant pain in varying degrees of severity.    HISTORICAL  Krista Maddox is a 30 years old right-handed female, seen in consultation by his ophthalmologist Dr. London Sheer for evaluation of bilateral papillary edema.  I reviewed notes from Dr. Shea Evans in May 28 2015, patient was seen for complains of blurry vision, especially seen distance, on examination, visual acuity right eye 20/25 minus, left 20/30 plus, bilateral papillary edema   She denied previous history of headache, since May 2016, she began to notice frequent headaches, bilateral retro-orbital area constant pressure, 8 out of 10 on daily basis, sometimes it would exacerbate to a more severe 10 out of 10 pounding headaches, with associated light noise sensitivity, nauseous, bending down or getting up quickly also causing her dizzines.  She has been taking up to 12 tablets of ibuprofen each day, which only temporarily helps her headaches,  She received etonogestrel (NEXPLANON) 68 MG IMPL implant in July 14th 2016. She complains of 25 pound weight gain since last year.  UPDATE July 28th 2016: We have reviewed MRI brain: Enlarged partially empty sella. Enlarged optic nerve sheaths. Findings are non-specific but can be seen in association with idiopathic intracranial hypertension.No acute findings. No masses or hydrocephalus.   She had a fluoroscopy guided lumbar puncture in June 25 2015, was open pressure 33 cm water, she has developed post LP headaches,  eventually had blood patch in June 30 2015, with immediate relief of her headaches  Spinal fluid testing showed total protein 43, glucose 53, WBC 20, RBC 1, 72% lymphocytes, 24% monocytes  She continue has mild blurry vision, occasionally double vision on extreme gaze to the left or right, low-grade bifrontal pressure headaches, she has tried Maxalt as needed, which has been helpful for moderate and severe headaches. She is tolerating Topamax 100 mg twice a day well  UPDATE August 26 2015: She had LP in June 25 2015, open pressure was 33 cm H2O, CSF showed elevated WBC of 20, 70% of lymphocyte, RBC was 1, she developed post LP headaches, severe positional headaches eventually relieved by blood patch June 30 2015, somewhat responsive to Fioricet , with her increased headaches, she also noticed with sudden positional change, she had a worsening headaches, need to refocus  She is able to tolerate Topamax 100 mg twice a day, goes to school, stay up late at night, has tried exercise regularly  She began to have recurrent headaches since September 2016, bilateral frontal temporal region severe pounding headache was associated light noise sensitivity, 7/10, sometimes 10 out of 10 severe headaches, Maxalt 5 mg as needed was somewhat helpful  She has gained 40 pounds since January 2016  NEXPLANON implant was put in 2015, had weight gain, ws taken out in Sep, 2016  She was evaluated by her optometrist couple weeks ago, had a new prescription  UPDATE Sep 16 2015: She continues to have moderate to severe headache each week, bilateral retro-orbital area, pounding, with light noise sensitivity, Maxalt  10 mg as needed only provide limited help, her weight has been stable at 230 pounds, she is taking Topamax 150 mg twice a day, complains of numbness tingling at her fingertips  UPDATE Nov 03 2015: She had repeat LP  in Sep 29 2015, showed Open pressure is 26 cm H2O. 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   REVIEW OF SYSTEMS: Full 14 system review of systems performed and notable only for dizziness, headaches ALLERGIES: No Known Allergies  HOME MEDICATIONS: Current Outpatient Prescriptions  Medication Sig Dispense Refill  . butalbital-acetaminophen-caffeine (FIORICET, ESGIC) 50-325-40 MG per tablet Take 1 tablet by mouth every 6 (six) hours as needed for headache. 30 tablet 0  . etonogestrel (NEXPLANON) 68 MG IMPL implant 1 each by Subdermal route once.    . fluticasone (FLONASE) 50 MCG/ACT nasal spray Place 1 spray into both nostrils daily. 16 g 0  . ibuprofen (ADVIL,MOTRIN) 200 MG tablet Take 200 mg by mouth every 6 (six) hours as needed.    . ondansetron (ZOFRAN) 4 MG tablet Take 1 tablet (4 mg total) by mouth every 8 (eight) hours as needed for nausea or vomiting. 30 tablet 0  . rizatriptan (MAXALT-MLT) 5 MG disintegrating tablet Take 1 tablet (5 mg total) by mouth as needed. May repeat in 2 hours if needed 15 tablet 6  . topiramate (TOPAMAX) 100 MG tablet Half tablet twice a day for 1 week, then 1 tablet twice a day 60 tablet 11   No current facility-administered medications for this visit.    PAST MEDICAL HISTORY: Past Medical History  Diagnosis Date  . TTP  (thrombotic thrombocytopenic purpura) (HCC)   . Headache     PAST SURGICAL HISTORY: Past Surgical History  Procedure Laterality Date  . Cholecystectomy    . Cesarean section    . Wisdom tooth extraction      FAMILY HISTORY: Family History  Problem Relation Age of Onset  . Diabetes Mother   . Hypertension Mother   . Ovarian cancer Mother     Hysterectomy  . Ovarian cancer Maternal Grandmother     Hysterectomy  . Healthy Father     SOCIAL HISTORY:  Social History   Social History  . Marital Status: Single    Spouse Name: N/A  . Number of Children: 1  . Years of Education: In college   Occupational History  . Dispatcher    Social History Main Topics  . Smoking status: Current Every Day Smoker -- 0.50 packs/day  . Smokeless tobacco: Never Used  . Alcohol Use: No  . Drug Use: No  . Sexual Activity: Yes    Birth Control/ Protection: Implant   Other Topics Concern  . Not on  file   Social History Narrative   Lives at home with her mother and daughter.   3-4 cups caffeine daily.   Left-handed.     PHYSICAL EXAM   Filed Vitals:   01/05/16 1113  Height:  (1.651 m)  Weight: 219 lb (99.338 kg)    Not recorded      Body mass index is 36.44 kg/(m^2).  PHYSICAL EXAMNIATION:  Gen: NAD, conversant, well nourised, obese, well groomed                     Cardiovascular: Regular rate rhythm, no peripheral edema, warm, nontender. Eyes: Conjunctivae clear without exudates or hemorrhage Neck: Supple, no carotid bruise. Pulmonary: Clear to auscultation bilaterally   NEUROLOGICAL EXAM:  MENTAL STATUS: Speech:    Speech is normal; fluent and spontaneous with normal comprehension.  Cognition:    The patient is oriented to person, place, and time;     recent and remote memory intact;     language fluent;     normal attention, concentration,     fund of knowledge.  CRANIAL NERVES: CN II: Visual fields are full to confrontation. Fundoscopic exam showed  mild blurry at at bilateral disc, I was not able to appreciate venous pulsations bilaterally. Pupil equal round reactive to light, end gaze horizontal nystagmus, mild bilateral exophoria CN III, IV, VI: extraocular movement are normal. No ptosis. CN V: Facial sensation is intact to pinprick in all 3 divisions bilaterally. Corneal responses are intact.  CN VII: Face is symmetric with normal eye closure and smile. CN VIII: Hearing is normal to rubbing fingers CN IX, X: Palate elevates symmetrically. Phonation is normal. CN XI: Head turning and shoulder shrug are intact CN XII: Tongue is midline with normal movements and no atrophy.  MOTOR: There is no pronator drift of out-stretched arms. Muscle bulk and tone are normal. Muscle strength is normal.  REFLEXES: Reflexes are 2+ and symmetric at the biceps, triceps, knees, and ankles. Plantar responses are flexor.  SENSORY: Light touch, pinprick, position sense, and vibration sense are intact in fingers and toes.  COORDINATION: Rapid alternating movements and fine finger movements are intact. There is no dysmetria on finger-to-nose and heel-knee-shin.   GAIT/STANCE: Posture is normal. Gait is steady with normal steps, base, arm swing, and turning. Heel and toe walking are normal. Tandem gait is normal.  Romberg is absent.   DIAGNOSTIC DATA (LABS, IMAGING, TESTING) - I reviewed patient records, labs, notes, testing and imaging myself where available.   Lab Results  Component Value Date   CHOL 197 11/24/2009   HDL 45 11/24/2009   LDLCALC 135* 11/24/2009   TRIG 85 11/24/2009   CHOLHDL 4.4 Ratio 11/24/2009   No results found for: HGBA1C Lab Results  Component Value Date   VITAMINB12 375 06/04/2015   Lab Results  Component Value Date   TSH 2.830 06/04/2015      ASSESSMENT AND PLAN  Krista Maddox is a 30 y.o. female  with rapid weight gain, obesity, progestin implantation, presented with bilateral papillary edema,    Pseudotumor cerebri  Continued evidence of mild bilateral papillary edema  Despite higher dose of Topamax 150 mg twice a day, nortriptyline 25 mg every night   Maxalt 10 mg as needed  Refer her to fluoroscopy guided lumbar puncture     Levert Feinstein, M.D. Ph.D.  [facility] [address], [address] [place], [place] [address] Ph: [phone] Fax: [phone]

## 2016-01-08 ENCOUNTER — Telehealth: Payer: Self-pay | Admitting: Neurology

## 2016-01-08 NOTE — Telephone Encounter (Signed)
Per Dr. Terrace Arabia, Xanax place up front (per office protocol).  Patient aware and will come to office for pick up.

## 2016-01-08 NOTE — Telephone Encounter (Signed)
Patient called to advise she is scheduled for spinal tap 01/19/16, was prescribed anti-anxiety medication the last time she had a spinal tap, requests anti-anxiety medication for this upcoming spinal tap.

## 2016-01-19 ENCOUNTER — Other Ambulatory Visit: Payer: Self-pay | Admitting: Radiology

## 2016-01-19 ENCOUNTER — Other Ambulatory Visit: Payer: Self-pay | Admitting: Neurology

## 2016-01-19 ENCOUNTER — Ambulatory Visit
Admission: RE | Admit: 2016-01-19 | Discharge: 2016-01-19 | Disposition: A | Discharge status: Home / Self Care | Payer: Managed Care, Other (non HMO) | Source: Ambulatory Visit | Attending: Neurology | Admitting: Neurology

## 2016-01-19 DIAGNOSIS — G932 Benign intracranial hypertension: Secondary | ICD-10-CM

## 2016-01-19 LAB — CSF CELL COUNT WITH DIFFERENTIAL
RBC COUNT CSF: 0 uL
TUBE #: 4
WBC CSF: 1 uL (ref 0–5)

## 2016-01-19 LAB — PROTEIN, CSF: TOTAL PROTEIN, CSF: 50 mg/dL — AB (ref 15–45)

## 2016-01-19 LAB — GLUCOSE, CSF: Glucose, CSF: 55 mg/dL (ref 43–76)

## 2016-01-19 NOTE — Discharge Instructions (Signed)

## 2016-01-21 ENCOUNTER — Other Ambulatory Visit: Payer: Self-pay | Admitting: *Deleted

## 2016-01-21 ENCOUNTER — Telehealth: Payer: Self-pay | Admitting: Neurology

## 2016-01-21 ENCOUNTER — Encounter: Payer: Self-pay | Admitting: Neurology

## 2016-01-21 DIAGNOSIS — G971 Other reaction to spinal and lumbar puncture: Secondary | ICD-10-CM

## 2016-01-21 DIAGNOSIS — G932 Benign intracranial hypertension: Secondary | ICD-10-CM

## 2016-01-21 NOTE — Telephone Encounter (Signed)
Spoke to Krista Maddox - she has tried Geophysicist/field seismologist without any improvement.  Per Dr. Zannie Cove vo, order placed for epidural blood patch.  Spoke to [employer] - she is being worked in at 11:15am.

## 2016-01-21 NOTE — Telephone Encounter (Signed)
Krista Maddox said she would like to discuss surgery rather than continuing to go through lumbar punctures. She is asking for a referral, if you feel it is appropriate.

## 2016-01-21 NOTE — Telephone Encounter (Signed)
Pt called and states she had a spinal tap on Monday and has a headache since last night and is getting worse. She is requesting a call back.[phone]

## 2016-01-22 ENCOUNTER — Ambulatory Visit
Admission: RE | Admit: 2016-01-22 | Discharge: 2016-01-22 | Disposition: A | Discharge status: Home / Self Care | Payer: Managed Care, Other (non HMO) | Source: Ambulatory Visit | Attending: Neurology | Admitting: Neurology

## 2016-01-22 ENCOUNTER — Other Ambulatory Visit: Payer: Self-pay | Admitting: Neurology

## 2016-01-22 DIAGNOSIS — G971 Other reaction to spinal and lumbar puncture: Secondary | ICD-10-CM

## 2016-01-22 MED ORDER — IOHEXOL 180 MG/ML  SOLN
1.0000 mL | Freq: Once | INTRAMUSCULAR | Status: AC | PRN
  Administered 2016-01-22: 1 mL via EPIDURAL

## 2016-01-22 NOTE — Addendum Note (Signed)
Addended by: Levert Feinstein on: 01/22/2016 08:02 AM   Modules accepted: Orders

## 2016-01-22 NOTE — Telephone Encounter (Signed)
I agree, lumboperitoneal shunt is next reasonable option, I will refer her to neurosurgeon Dr. Venetia Maxon

## 2016-01-22 NOTE — Discharge Instructions (Signed)
Epidural Blood Patch Discharge Instructions ° °1. Go home and rest quietly for the next 24 hours.  It is important to lie flat for the next 24 hours.  Get up only to go to the restroom.  You may lie in the bed or on a couch on your back, your stomach, your left side or your right side.  You may have one pillow under your head.  You may have pillows between your knees while you are on your side or under your knees while you are on your back. ° °2. DO NOT drive today.  Recline the seat as far back as it will go, while still wearing your seat belt, on the way home. ° °3. You may get up to go to the bathroom as needed.  You may sit up for 10 minutes to eat.  You may resume your normal diet and medications unless otherwise indicated.  Drink lots of extra fluids today and tomorrow. ° °4. You may resume normal activities after your 24 hours of bed rest is over; however, do not exert yourself strongly or do any heavy lifting tomorrow. ° °5. Call your physician for a follow-up appointment.  ° °6. If you have any questions  after you arrive home, please call [phone]. ° °Discharge instructions have been explained to the patient.  The patient, or the person responsible for the patient, fully understands these instructions. °

## 2016-01-22 NOTE — Telephone Encounter (Signed)
Orders and Notes have been faxed to [facility] at Dr. Fredrich Birks office spoke with Thayer Ohm as well [phone]. Explained to [place] about patient's status she understood and relayed Dr. Rush Farmer had some cancellations and she could get patient in. Called patient and spoke to patient so she could have some relief she is very happy . Patient relayed thanks you Krista Maddox.

## 2016-01-22 NOTE — Progress Notes (Signed)
20cc blood drawn from right AC space for Epidural Blood Patch; site unremarkable.  Jeanne Lohr, RN 

## 2016-02-02 ENCOUNTER — Encounter: Payer: Self-pay | Admitting: Neurology

## 2016-02-02 ENCOUNTER — Ambulatory Visit (INDEPENDENT_AMBULATORY_CARE_PROVIDER_SITE_OTHER): Payer: Managed Care, Other (non HMO) | Admitting: Neurology

## 2016-02-02 VITALS — BP 117/80 | HR 95 | Ht 65.0 in | Wt 218.0 lb

## 2016-02-02 DIAGNOSIS — G932 Benign intracranial hypertension: Secondary | ICD-10-CM

## 2016-02-02 NOTE — Progress Notes (Signed)
Chief Complaint  Patient presents with  . Pseudotumor Cerebri    Her last LP was 01/19/16 and she developed an intolerable headache.  She received a blood patch on 01/22/16.  She saw Dr. Conchita Paris on 01/29/16 to discuss a possible shunt placement.  He has referred her to [facility].      PATIENT: Krista Maddox DOB: [birth year]  Chief Complaint  Patient presents with  . Pseudotumor Cerebri    Her last LP was 01/19/16 and she developed an intolerable headache.  She received a blood patch on 01/22/16.  She saw Dr. Conchita Paris on 01/29/16 to discuss a possible shunt placement.  He has referred her to [facility].    HISTORICAL  Krista Maddox is a 30 years old right-handed female, seen in consultation by his ophthalmologist Dr. London Sheer for evaluation of bilateral papillary edema.  I reviewed notes from Dr. Shea Evans in May 28 2015, patient was seen for complains of blurry vision, especially seen distance, on examination, visual acuity right eye 20/25 minus, left 20/30 plus, bilateral papillary edema   She denied previous history of headache, since May 2016, she began to notice frequent headaches, bilateral retro-orbital area constant pressure, 8 out of 10 on daily basis, sometimes it would exacerbate to a more severe 10 out of 10 pounding headaches, with associated light noise sensitivity, nauseous, bending down or getting up quickly also causing her dizzines.  She has been taking up to 12 tablets of ibuprofen each day, which only temporarily helps her headaches,  She received etonogestrel (NEXPLANON) 68 MG IMPL implant in July 14th 2016. She complains of 25 pound weight gain since last year.  UPDATE July 28th 2016: We have reviewed MRI brain: Enlarged partially empty sella. Enlarged optic nerve sheaths. Findings are non-specific but can be seen in association with idiopathic intracranial hypertension.No acute findings. No masses or hydrocephalus.   She had a fluoroscopy guided lumbar puncture in June 25 2015, was open pressure 33 cm water, she has developed post LP headaches, eventually had blood patch in June 30 2015, with immediate relief of her headaches  Spinal fluid testing showed total protein 43, glucose 53, WBC 20, RBC 1, 72% lymphocytes, 24% monocytes  She continue has mild blurry vision, occasionally double vision on extreme gaze to the left or right, low-grade bifrontal pressure headaches, she has tried Maxalt as needed, which has been helpful for moderate and severe headaches. She is tolerating Topamax 100 mg twice a day well  UPDATE August 26 2015: She had LP in June 25 2015, open pressure was 33 cm H2O, CSF showed elevated WBC of 20, 70% of lymphocyte, RBC was 1, she developed post LP headaches, severe positional headaches eventually relieved by blood patch June 30 2015, somewhat responsive to Fioricet , with her increased headaches, she also noticed with sudden positional change, she had a worsening headaches, need to refocus  She is able to tolerate Topamax 100 mg twice a day, goes to school, stay up late at night, has tried exercise regularly  She began to have recurrent headaches since September 2016, bilateral frontal temporal region severe pounding headache was associated light noise sensitivity, 7/10, sometimes 10 out of 10 severe headaches, Maxalt 5 mg as needed was somewhat helpful  She has gained 40 pounds since January 2016  NEXPLANON implant was put in 2015, had weight gain, ws taken out in Sep, 2016  She was evaluated by her optometrist couple weeks ago, had a new prescription  UPDATE Sep 16 2015: She continues to have moderate to severe headache each week, bilateral retro-orbital area, pounding, with light noise sensitivity, Maxalt 10 mg as needed only provide limited help, her weight has been stable at 230 pounds, she is taking Topamax 150 mg twice a day, complains of numbness tingling at her fingertips  UPDATE Nov 03 2015: She had repeat LP  in Sep 29 2015,  showed Open pressure is 26 cm H2O. 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  UPDATE Feb 02 2016: She had repeat lumbar puncture January 19 2016, open pressure was 23 cm water, unfortunately she developed post LP headache, failed to improve by Fioricet, eventually had blood patch January 22 2016, she was evaluated by neurosurgeon Dr. Conchita Paris for potential shunt replacement, is referred to [facility]. She is now taking Topamax 150 mg twice a day, has lost 16 pounds over 3 months, Maxalt as needed was helpful for headache  REVIEW OF SYSTEMS: Full 14 system review of systems performed and notable only for dizziness, headaches ALLERGIES: No Known Allergies  HOME MEDICATIONS: Current Outpatient Prescriptions  Medication Sig Dispense Refill  . butalbital-acetaminophen-caffeine (FIORICET, ESGIC) 50-325-40 MG per tablet Take 1 tablet by mouth every 6 (six) hours as needed for headache. 30 tablet 0  . etonogestrel (NEXPLANON) 68 MG IMPL implant 1 each by Subdermal route once.    . fluticasone (FLONASE) 50 MCG/ACT nasal spray Place 1 spray into both nostrils daily. 16 g 0  . ibuprofen (ADVIL,MOTRIN) 200 MG tablet Take 200 mg by mouth every 6 (six) hours as needed.    .  ondansetron (ZOFRAN) 4 MG tablet Take 1 tablet (4 mg total) by mouth every 8 (eight) hours as needed for nausea or vomiting. 30 tablet 0  . rizatriptan (MAXALT-MLT) 5 MG disintegrating tablet Take 1 tablet (5 mg total) by mouth as needed. May repeat in 2 hours if needed 15 tablet 6  . topiramate (TOPAMAX) 100 MG tablet Half tablet twice a day for 1 week, then 1 tablet twice a day 60 tablet 11   No current facility-administered medications for this visit.    PAST MEDICAL HISTORY: Past Medical History  Diagnosis Date  . TTP (thrombotic thrombocytopenic purpura) (HCC)   . Headache     PAST SURGICAL HISTORY: Past Surgical History  Procedure Laterality Date  . Cholecystectomy    . Cesarean section    . Wisdom tooth extraction      FAMILY HISTORY: Family History  Problem Relation Age of Onset  . Diabetes Mother   . Hypertension Mother   . Ovarian cancer Mother     Hysterectomy  . Ovarian cancer Maternal Grandmother     Hysterectomy  . Healthy Father  SOCIAL HISTORY:  Social History   Social History  . Marital Status: Single    Spouse Name: N/A  . Number of Children: 1  . Years of Education: In college   Occupational History  . Dispatcher    Social History Main Topics  . Smoking status: Current Every Day Smoker -- 0.50 packs/day  . Smokeless tobacco: Never Used  . Alcohol Use: No  . Drug Use: No  . Sexual Activity: Yes    Birth Control/ Protection: Implant   Other Topics Concern  . Not on file   Social History Narrative   Lives at home with her mother and daughter.   3-4 cups caffeine daily.   Left-handed.     PHYSICAL EXAM   Filed Vitals:   02/02/16 0742  BP: 117/80  Pulse: 95  Height: 5\' 5"  (1.651 m)  Weight: 218 lb (98.884 kg)    Not recorded      Body mass index is 36.28 kg/(m^2).  PHYSICAL EXAMNIATION:  Gen: NAD, conversant, well nourised, obese, well groomed                     Cardiovascular: Regular rate rhythm, no peripheral  edema, warm, nontender. Eyes: Conjunctivae clear without exudates or hemorrhage Neck: Supple, no carotid bruise. Pulmonary: Clear to auscultation bilaterally   NEUROLOGICAL EXAM:  MENTAL STATUS: Speech:    Speech is normal; fluent and spontaneous with normal comprehension.  Cognition:    The patient is oriented to person, place, and time;     recent and remote memory intact;     language fluent;     normal attention, concentration,     fund of knowledge.  CRANIAL NERVES: CN II: Visual fields are full to confrontation. Fundoscopic exam showed mild blurry at at bilateral disc, I was not able to appreciate venous pulsations bilaterally. Pupil equal round reactive to light, end gaze horizontal nystagmus, mild bilateral exophoria CN III, IV, VI: extraocular movement are normal. No ptosis. CN V: Facial sensation is intact to pinprick in all 3 divisions bilaterally. Corneal responses are intact.  CN VII: Face is symmetric with normal eye closure and smile. CN VIII: Hearing is normal to rubbing fingers CN IX, X: Palate elevates symmetrically. Phonation is normal. CN XI: Head turning and shoulder shrug are intact CN XII: Tongue is midline with normal movements and no atrophy.  MOTOR: There is no pronator drift of out-stretched arms. Muscle bulk and tone are normal. Muscle strength is normal.  REFLEXES: Reflexes are 2+ and symmetric at the biceps, triceps, knees, and ankles. Plantar responses are flexor.  SENSORY: Light touch, pinprick, position sense, and vibration sense are intact in fingers and toes.  COORDINATION: Rapid alternating movements and fine finger movements are intact. There is no dysmetria on finger-to-nose and heel-knee-shin.   GAIT/STANCE: Posture is normal. Gait is steady with normal steps, base, arm swing, and turning. Heel and toe walking are normal. Tandem gait is normal.  Romberg is absent.   DIAGNOSTIC DATA (LABS, IMAGING, TESTING) - I reviewed patient  records, labs, notes, testing and imaging myself where available.   Lab Results  Component Value Date   CHOL 197 11/24/2009   HDL 45 11/24/2009   LDLCALC 135* 11/24/2009   TRIG 85 11/24/2009   CHOLHDL 4.4 Ratio 11/24/2009   No results found for: HGBA1C Lab Results  Component Value Date   VITAMINB12 375 06/04/2015   Lab Results  Component Value Date   TSH 2.830 06/04/2015  ASSESSMENT AND PLAN  Krista Maddox is a 30 y.o. female  with rapid weight gain, obesity, progestin implantation, presented with bilateral papillary edema,   Pseudotumor cerebri  Continued evidence of mild bilateral papillary edema  Despite higher dose of Topamax 150 mg twice a day, nortriptyline 25 mg every night   Maxalt 10 mg as needed   She developed post LP headache after each lumbar puncture, will refer her to shunt placement   Levert Feinstein, M.D. Ph.D.  [facility] [address] [place], [place] [address] Ph: [phone] Fax: [phone]

## 2016-02-12 ENCOUNTER — Ambulatory Visit: Payer: Managed Care, Other (non HMO) | Admitting: Neurology

## 2016-04-22 ENCOUNTER — Telehealth: Payer: Self-pay | Admitting: Neurology

## 2016-04-22 MED ORDER — NORTRIPTYLINE HCL 25 MG PO CAPS: 50.0000 mg | ORAL_CAPSULE | Freq: Every day | ORAL | Status: DC

## 2016-04-22 MED ORDER — TOPIRAMATE 100 MG PO TABS: 150.0000 mg | ORAL_TABLET | Freq: Two times a day (BID) | ORAL | Status: DC

## 2016-04-22 NOTE — Telephone Encounter (Signed)
Pt called sts she lost her job in February and does not have [employer] at all.  She needs refill for topiramate (TOPAMAX) 100 MG tablet and nortriptyline (PAMELOR) 25 MG capsule [facility] [address]

## 2016-04-22 NOTE — Telephone Encounter (Signed)
[facility] so her cost will be less expensive (she has co-pay card).  Prescriptions sent to requested pharmacy.

## 2016-05-21 ENCOUNTER — Other Ambulatory Visit: Payer: Self-pay | Admitting: Neurology

## 2016-05-25 ENCOUNTER — Telehealth: Payer: Self-pay | Admitting: Neurology

## 2016-05-25 ENCOUNTER — Encounter: Payer: Self-pay | Admitting: *Deleted

## 2016-05-25 ENCOUNTER — Other Ambulatory Visit: Payer: Self-pay | Admitting: *Deleted

## 2016-05-25 MED ORDER — NORTRIPTYLINE HCL 50 MG PO CAPS: 50.0000 mg | ORAL_CAPSULE | Freq: Every day | ORAL | Status: DC

## 2016-05-25 NOTE — Telephone Encounter (Signed)
Patient called to advise, pharmacy won't fill Rx for nortriptyline (PAMELOR) 25 MG capsule, patient doesn't understand since there are refills on this medication. Please call to advise.

## 2016-05-25 NOTE — Telephone Encounter (Signed)
Spoke to patient - she is taking nortriptyline 25mg , 2 tabs qhs.  She needs a new rx for nortriptyline 50mg , 1 tab qhs sent to [facility] [address]  She is paying cash and this dose is less expensive with her goodrx coupon.  Rx sent.

## 2016-06-01 ENCOUNTER — Ambulatory Visit (INDEPENDENT_AMBULATORY_CARE_PROVIDER_SITE_OTHER): Payer: Self-pay | Admitting: Neurology

## 2016-06-01 ENCOUNTER — Encounter: Payer: Self-pay | Admitting: Neurology

## 2016-06-01 VITALS — BP 126/82 | HR 68 | Ht 65.0 in | Wt 208.8 lb

## 2016-06-01 DIAGNOSIS — G43709 Chronic migraine without aura, not intractable, without status migrainosus: Secondary | ICD-10-CM

## 2016-06-01 DIAGNOSIS — G932 Benign intracranial hypertension: Secondary | ICD-10-CM

## 2016-06-01 DIAGNOSIS — IMO0002 Reserved for concepts with insufficient information to code with codable children: Secondary | ICD-10-CM | POA: Insufficient documentation

## 2016-06-01 MED ORDER — MAGNESIUM OXIDE -MG SUPPLEMENT 400 (240 MG) MG PO TABS: 400.0000 mg | ORAL_TABLET | Freq: Two times a day (BID) | ORAL | Status: DC

## 2016-06-01 MED ORDER — RIBOFLAVIN 100 MG PO TABS: 100.0000 mg | ORAL_TABLET | Freq: Two times a day (BID) | ORAL | Status: DC

## 2016-06-01 NOTE — Progress Notes (Signed)
Chief Complaint  Patient presents with  . Pseudotumor Cerebri    Reports still having frequent headaches.  She is continuing to take both Topamax and Pamelor, as prescribed.  She rarely uses Maxalt for pain managment but it does work well for her more severe headaches.  She hasn't used Fioricet since her post-LP headache.      PATIENT: Krista Maddox DOB: 07/20/1986  Chief Complaint  Patient presents with  . Pseudotumor Cerebri    Reports still having frequent headaches.  She is continuing to take both Topamax and Pamelor, as prescribed.  She rarely uses Maxalt for pain managment but it does work well for her more severe headaches.  She hasn't used Fioricet since her post-LP headache.    HISTORICAL  Krista Maddox is a 30 years old right-handed female, seen in consultation by his ophthalmologist Dr. London SheerPeter Dunn for evaluation of bilateral papillary edema.  I reviewed notes from Dr. Shea Evansunn in May 28 2015, patient was seen for complains of blurry vision, especially seen distance, on examination, visual acuity right eye 20/25 minus, left 20/30 plus, bilateral papillary edema   She denied previous history of headache, since May 2016, she began to notice frequent headaches, bilateral retro-orbital area constant pressure, 8 out of 10 on daily basis, sometimes it would exacerbate to a more severe 10 out of 10 pounding headaches, with associated light noise sensitivity, nauseous, bending down or getting up quickly also causing her dizzines.  She has been taking up to 12 tablets of ibuprofen each day, which only temporarily helps her headaches,  She received etonogestrel (NEXPLANON) 68 MG IMPL implant in July 14th 2016. She complains of 25 pound weight gain since last year.  UPDATE July 28th 2016: We have reviewed MRI brain: Enlarged partially empty sella. Enlarged optic nerve sheaths. Findings are non-specific but can be seen in association with idiopathic intracranial hypertension.No  acute findings. No masses or hydrocephalus.   She had a fluoroscopy guided lumbar puncture in June 25 2015, with open pressure 33 cm water, she has developed post LP headaches, eventually had blood patch in June 30 2015, with immediate relief of her headaches  Spinal fluid testing showed total protein 43, glucose 53, WBC 20, RBC 1, 72% lymphocytes, 24% monocytes  She continue has mild blurry vision, occasionally double vision on extreme gaze to the left or right, low-grade bifrontal pressure headaches, she has tried Maxalt as needed, which has been helpful for moderate and severe headaches. She is tolerating Topamax 100 mg twice a day well  UPDATE August 26 2015: She had LP in June 25 2015, open pressure was 33 cm H2O, CSF showed elevated WBC of 20, 70% of lymphocyte, RBC was 1, she developed post LP headaches, severe positional headaches eventually relieved by blood patch June 30 2015, somewhat responsive to Fioricet , with her increased headaches, she also noticed with sudden positional change, she had a worsening headaches, need to refocus  She is able to tolerate Topamax 100 mg twice a day, goes to school, stay up late at night, has tried exercise regularly  She began to have recurrent headaches since September 2016, bilateral frontal temporal region severe pounding headache was associated light noise sensitivity, 7/10, sometimes 10 out of 10 severe headaches, Maxalt 5 mg as needed was somewhat helpful  She has gained 40 pounds since January 2016  NEXPLANON implant was put in 2015, had weight gain, ws taken out in Sep, 2016  She was evaluated by her optometrist couple  weeks ago, had a new prescription  UPDATE Sep 16 2015: She continues to have moderate to severe headache each week, bilateral retro-orbital area, pounding, with light noise sensitivity, Maxalt 10 mg as needed only provide limited help, her weight has been stable at 230 pounds, she is taking Topamax 150 mg twice a day,  complains of numbness tingling at her fingertips  UPDATE Nov 03 2015: She had repeat LP  in Sep 29 2015, showed Open pressure is 26 cm H2O. 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  UPDATE Feb 02 2016: She had repeat lumbar puncture January 19 2016, open pressure was 23 cm water, unfortunately she developed post LP headache, failed to improve by Fioricet, eventually had blood patch January 22 2016, she was evaluated by neurosurgeon Dr. Conchita Paris for potential shunt replacement, [facility]. She is now taking Topamax 150 mg twice a day, has lost 16 pounds over 3 months, Maxalt as needed was helpful for headache  UPDATE June 01 2016: She still has almost daily bilateral frontal pressure headaches, couple times a month it would exacerbate to a much more severe headache, with dizziness, blurry vision, with associated light noise sensitivity, nauseous, Maxalt was helpful most of the time, she continued to losing weight, lost 10 pounds in 4 months,   REVIEW OF SYSTEMS: Full 14 system review of systems performed and notable only for dizziness, headaches ALLERGIES: No Known Allergies  HOME MEDICATIONS: Current Outpatient Prescriptions  Medication Sig Dispense  Refill  . butalbital-acetaminophen-caffeine (FIORICET, ESGIC) 50-325-40 MG per tablet Take 1 tablet by mouth every 6 (six) hours as needed for headache. 30 tablet 0  . etonogestrel (NEXPLANON) 68 MG IMPL implant 1 each by Subdermal route once.    . fluticasone (FLONASE) 50 MCG/ACT nasal spray Place 1 spray into both nostrils daily. 16 g 0  . ibuprofen (ADVIL,MOTRIN) 200 MG tablet Take 200 mg by mouth every 6 (six) hours as needed.    . ondansetron (ZOFRAN) 4 MG tablet Take 1 tablet (4 mg total) by mouth every 8 (eight) hours as needed for nausea or vomiting. 30 tablet 0  . rizatriptan (MAXALT-MLT) 5 MG disintegrating tablet Take 1 tablet (5 mg total) by mouth as needed. May repeat in 2 hours if needed 15 tablet 6  . topiramate (TOPAMAX) 100 MG tablet Half tablet twice a day for 1 week, then 1 tablet twice a day 60 tablet 11   No current facility-administered medications for this visit.    PAST MEDICAL HISTORY: Past Medical History  Diagnosis Date  . TTP (thrombotic thrombocytopenic purpura) (HCC)   . Headache     PAST SURGICAL HISTORY: Past Surgical History  Procedure Laterality Date  . Cholecystectomy    .  Cesarean section    . Wisdom tooth extraction      FAMILY HISTORY: Family History  Problem Relation Age of Onset  . Diabetes Mother   . Hypertension Mother   . Ovarian cancer Mother     Hysterectomy  . Ovarian cancer Maternal Grandmother     Hysterectomy  . Healthy Father     SOCIAL HISTORY:  Social History   Social History  . Marital Status: Single    Spouse Name: N/A  . Number of Children: 1  . Years of Education: In college   Occupational History  . Dispatcher    Social History Main Topics  . Smoking status: Current Every Day Smoker -- 0.50 packs/day  . Smokeless tobacco: Never Used  . Alcohol Use: No  . Drug Use: No  . Sexual Activity: Yes    Birth Control/ Protection: Implant   Other Topics Concern  . Not on file   Social History Narrative    Lives at home with her mother and daughter.   3-4 cups caffeine daily.   Left-handed.     PHYSICAL EXAM   Filed Vitals:   06/01/16 0752  BP: 126/82  Pulse: 68  Height: 5\' 5"  (1.651 m)  Weight: 208 lb 12 oz (94.688 kg)    Not recorded      Body mass index is 34.74 kg/(m^2).  PHYSICAL EXAMNIATION:  Gen: NAD, conversant, well nourised, obese, well groomed                     Cardiovascular: Regular rate rhythm, no peripheral edema, warm, nontender. Eyes: Conjunctivae clear without exudates or hemorrhage Neck: Supple, no carotid bruise. Pulmonary: Clear to auscultation bilaterally   NEUROLOGICAL EXAM:  MENTAL STATUS: Speech:    Speech is normal; fluent and spontaneous with normal comprehension.  Cognition:    The patient is oriented to person, place, and time;     recent and remote memory intact;     language fluent;     normal attention, concentration,     fund of knowledge.  CRANIAL NERVES: CN II: Visual fields are full to confrontation. Fundoscopic exam showed mild blurry at at Right disc only, left disc has sharp edge, visual acuity right 20/30, left 20/25, Pupil equal round reactive to light, end gaze horizontal nystagmus, mild bilateral exophoria CN III, IV, VI: extraocular movement are normal. No ptosis. CN V: Facial sensation is intact to pinprick in all 3 divisions bilaterally. Corneal responses are intact.  CN VII: Face is symmetric with normal eye closure and smile. CN VIII: Hearing is normal to rubbing fingers CN IX, X: Palate elevates symmetrically. Phonation is normal. CN XI: Head turning and shoulder shrug are intact CN XII: Tongue is midline with normal movements and no atrophy.  MOTOR: There is no pronator drift of out-stretched arms. Muscle bulk and tone are normal. Muscle strength is normal.  REFLEXES: Reflexes are 2+ and symmetric at the biceps, triceps, knees, and ankles. Plantar responses are flexor.  SENSORY: Light touch, pinprick, position  sense, and vibration sense are intact in fingers and toes.  COORDINATION: Rapid alternating movements and fine finger movements are intact. There is no dysmetria on finger-to-nose and heel-knee-shin.   GAIT/STANCE: Posture is normal. Gait is steady with normal steps, base, arm swing, and turning. Heel and toe walking are normal. Tandem gait is normal.  Romberg is absent.   DIAGNOSTIC DATA (LABS, IMAGING, TESTING) - I reviewed patient records, labs, notes, testing and imaging myself where  available.   Lab Results  Component Value Date   CHOL 197 11/24/2009   HDL 45 11/24/2009   LDLCALC 135* 11/24/2009   TRIG 85 11/24/2009   CHOLHDL 4.4 Ratio 11/24/2009   No results found for: HGBA1C Lab Results  Component Value Date   VITAMINB12 375 06/04/2015   Lab Results  Component Value Date   TSH 2.830 06/04/2015      ASSESSMENT AND PLAN  Krista Maddox is a 30 y.o. female  with rapid weight gain, obesity, progestin implantation, presented with bilateral papillary edema,   Pseudotumor cerebri  Continued evidence of mild right papillary edema, left side is close to be normal now  Despite higher dose of Topamax 150 mg twice a day, nortriptyline 50 mg every night  Continue moderate exercise, lose weight Chronic migraine  Associates as his magnesium oxide, riboflavin as preventive medication  Maxalt 10 mg as needed    Levert Feinstein, M.D. Ph.D.  [facility] [address], [address] [place], [place] [address] Ph: [phone] Fax: [phone]

## 2016-07-21 ENCOUNTER — Other Ambulatory Visit: Payer: Self-pay | Admitting: Neurology

## 2016-07-24 ENCOUNTER — Other Ambulatory Visit: Payer: Self-pay | Admitting: Neurology

## 2016-09-02 ENCOUNTER — Ambulatory Visit: Payer: Self-pay | Admitting: Neurology

## 2016-10-13 ENCOUNTER — Encounter: Payer: Self-pay | Admitting: Neurology

## 2016-10-13 ENCOUNTER — Ambulatory Visit (INDEPENDENT_AMBULATORY_CARE_PROVIDER_SITE_OTHER): Payer: Self-pay | Admitting: Neurology

## 2016-10-13 VITALS — BP 131/82 | HR 92 | Ht 65.0 in | Wt 208.0 lb

## 2016-10-13 DIAGNOSIS — M5416 Radiculopathy, lumbar region: Secondary | ICD-10-CM | POA: Insufficient documentation

## 2016-10-13 DIAGNOSIS — IMO0002 Reserved for concepts with insufficient information to code with codable children: Secondary | ICD-10-CM

## 2016-10-13 DIAGNOSIS — G43709 Chronic migraine without aura, not intractable, without status migrainosus: Secondary | ICD-10-CM

## 2016-10-13 DIAGNOSIS — G932 Benign intracranial hypertension: Secondary | ICD-10-CM

## 2016-10-13 MED ORDER — MELOXICAM 15 MG PO TABS: 15.0000 mg | ORAL_TABLET | Freq: Every day | ORAL | 6 refills | Status: DC

## 2016-10-13 MED ORDER — GABAPENTIN 300 MG PO CAPS: 300.0000 mg | ORAL_CAPSULE | Freq: Three times a day (TID) | ORAL | 6 refills | Status: DC

## 2016-10-13 NOTE — Progress Notes (Signed)
Chief Complaint  Patient presents with  . Pseudotumor Cerebri/Migraine    Estimates 1-2 migraines each that respond well to Maxalt.  . Back Pain    She has developed low back pain that is radiating into her left leg.      PATIENT: Krista FickHeather L Maddox DOB: 16-May-1986  Chief Complaint  Patient presents with  . Pseudotumor Cerebri/Migraine    Estimates 1-2 migraines each that respond well to Maxalt.  . Back Pain    She has developed low back pain that is radiating into her left leg.    HISTORICAL  Krista FickHeather L Maddox is a 30 years old right-handed female, seen in consultation by his ophthalmologist Dr. London SheerPeter Dunn for evaluation of bilateral papillary edema.  I reviewed notes from Dr. Shea Evansunn in May 28 2015, patient was seen for complains of blurry vision, especially seen distance, on examination, visual acuity right eye 20/25 minus, left 20/30 plus, bilateral papillary edema   She denied previous history of headache, since May 2016, she began to notice frequent headaches, bilateral retro-orbital area constant pressure, 8 out of 10 on daily basis, sometimes it would exacerbate to a more severe 10 out of 10 pounding headaches, with associated light noise sensitivity, nauseous, bending down or getting up quickly also causing her dizzines.  She has been taking up to 12 tablets of ibuprofen each day, which only temporarily helps her headaches,  She received etonogestrel (NEXPLANON) 68 MG IMPL implant in July 14th 2016. She complains of 25 pound weight gain since last year.  UPDATE July 28th 2016: We have reviewed MRI brain: Enlarged partially empty sella. Enlarged optic nerve sheaths. Findings are non-specific but can be seen in association with idiopathic intracranial hypertension.No acute findings. No masses or hydrocephalus.   She had a fluoroscopy guided lumbar puncture in June 25 2015, with open pressure 33 cm water, she has developed post LP headaches, eventually had blood patch in June 30 2015, with immediate relief of her headaches  Spinal fluid testing showed total protein 43, glucose 53, WBC 20, RBC 1, 72% lymphocytes, 24% monocytes  She continue has mild blurry vision, occasionally double vision on extreme gaze to the left or right, low-grade bifrontal pressure headaches, she has tried Maxalt as needed, which has been helpful for moderate and severe headaches. She is tolerating Topamax 100 mg twice a day well  UPDATE August 26 2015: She had LP in June 25 2015, open pressure was 33 cm H2O, CSF showed elevated WBC of 20, 70% of lymphocyte, RBC was 1, she developed post LP headaches, severe positional headaches eventually relieved by blood patch June 30 2015, somewhat responsive to Fioricet , with her increased headaches, she also noticed with sudden positional change, she had a worsening headaches, need to refocus  She is able to tolerate Topamax 100 mg twice a day, goes to school, stay up late at night, has tried exercise regularly  She began to have recurrent headaches since September 2016, bilateral frontal temporal region severe pounding headache was associated light noise sensitivity, 7/10, sometimes 10 out of 10 severe headaches, Maxalt 5 mg as needed was somewhat helpful  She has gained 40 pounds since January 2016  NEXPLANON implant was put in 2015, had weight gain, ws taken out in Sep, 2016  She was evaluated by her optometrist couple weeks ago, had a new prescription  UPDATE Sep 16 2015: She continues to have moderate to severe headache each week, bilateral retro-orbital area, pounding, with light noise sensitivity, Maxalt  10 mg as needed only provide limited help, her weight has been stable at 230 pounds, she is taking Topamax 150 mg twice a day, complains of numbness tingling at her fingertips  UPDATE Nov 03 2015: She had repeat LP  in Sep 29 2015, showed Open pressure is 26 cm H2O. 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  UPDATE Feb 02 2016: She had repeat lumbar puncture January 19 2016, open pressure was 23 cm water, unfortunately she developed post LP headache, failed to improve by Fioricet, eventually had blood patch January 22 2016, she was evaluated by neurosurgeon Dr. Conchita Paris for potential shunt replacement, is referred to [facility]. She is now taking Topamax 150 mg twice a day, has lost 16 pounds over 3 months, Maxalt as needed was helpful for headache  UPDATE June 01 2016: She still has almost daily bilateral frontal pressure headaches, couple times a month it would exacerbate to a much more severe headache, with dizziness, blurry vision, with associated light noise sensitivity, nauseous, Maxalt was helpful most of the time, she continued to losing weight, lost 10 pounds in 4 months,  UPDATE Oct 13 2016: She complains of one-week history of left-sided low back pain radiating pain to left leg all the way to her left foot, worsening by bearing weight, no right leg involvement no bowel and bladder incontinence, there was no trigger.  She has constant dull headaches 2/10, once a week, it will exaggerated lumbar migraine headaches, Maxalt has been helpful, she has no significant visual change. Continued to lost weight  tolerating Topamax 150 mg twice a day well,  REVIEW OF SYSTEMS: Full 14 system review of systems performed and notable only fornew-onset low back pain, headache numbness memory loss achy muscles, walking difficulty  ALLERGIES: No Known Allergies  HOME MEDICATIONS: Current Outpatient Prescriptions  Medication Sig Dispense Refill  . butalbital-acetaminophen-caffeine (FIORICET, ESGIC) 50-325-40 MG per tablet Take 1 tablet by mouth every 6 (six) hours as needed for headache. 30 tablet 0  . etonogestrel (NEXPLANON) 68 MG IMPL implant 1 each by Subdermal route once.    . fluticasone (FLONASE) 50 MCG/ACT nasal spray Place 1 spray into both nostrils daily. 16 g 0  . ibuprofen (ADVIL,MOTRIN) 200 MG tablet Take 200 mg by mouth every 6 (six) hours as needed.    . ondansetron (ZOFRAN) 4 MG tablet Take 1 tablet (4 mg total) by mouth every 8 (eight) hours as needed for nausea or vomiting. 30 tablet 0  . rizatriptan (MAXALT-MLT) 5 MG disintegrating tablet Take 1 tablet (5 mg total) by mouth as needed. May repeat in 2 hours if needed 15 tablet 6  . topiramate (TOPAMAX) 100 MG tablet Half tablet twice a day  for 1 week, then 1 tablet twice a day 60 tablet 11   No current facility-administered medications for this visit.    PAST MEDICAL HISTORY: Past Medical History:  Diagnosis Date  . Headache   . TTP (thrombotic thrombocytopenic purpura) (HCC)     PAST SURGICAL HISTORY: Past Surgical History:  Procedure Laterality Date  . CESAREAN SECTION    . CHOLECYSTECTOMY    . WISDOM TOOTH EXTRACTION      FAMILY HISTORY: Family History  Problem Relation Age of Onset  . Diabetes Mother   . Hypertension Mother   . Ovarian cancer Mother     Hysterectomy  . Ovarian cancer Maternal Grandmother     Hysterectomy  . Healthy Father     SOCIAL HISTORY:  Social History   Social History  . Marital status: Single    Spouse name: N/A  . Number of children: 1  . Years of education: In college    Occupational History  . Dispatcher    Social History Main Topics  . Smoking status: Current Every Day Smoker    Packs/day: 0.50  . Smokeless tobacco: Never Used  . Alcohol use No  . Drug use: No  . Sexual activity: Yes    Birth control/ protection: Implant   Other Topics Concern  . Not on file   Social History Narrative   Lives at home with her mother and daughter.   3-4 cups caffeine daily.   Left-handed.     PHYSICAL EXAM   Vitals:   10/13/16 0845  BP: 131/82  Pulse: 92  Weight: 208 lb (94.3 kg)  Height: 5\' 5"  (1.651 m)    Not recorded      Body mass index is 34.61 kg/m.  PHYSICAL EXAMNIATION:  Gen: NAD, conversant, well nourised, obese, well groomed                     Cardiovascular: Regular rate rhythm, no peripheral edema, warm, nontender. Eyes: Conjunctivae clear without exudates or hemorrhage Neck: Supple, no carotid bruise. Pulmonary: Clear to auscultation bilaterally   NEUROLOGICAL EXAM:  MENTAL STATUS: Speech:    Speech is normal; fluent and spontaneous with normal comprehension.  Cognition:    The patient is oriented to person, place, and time;     recent and remote memory intact;     language fluent;     normal attention, concentration,     fund of knowledge.  CRANIAL NERVES: CN II: Visual fields are full to confrontation. Fundoscopic exam showed mild blurry at at right disc only, left disc has sharp edge, Pupil equal round reactive to light, end gaze horizontal nystagmus, mild bilateral exophoria CN III, IV, VI: extraocular movement are normal. No ptosis. CN V: Facial sensation is intact to pinprick in all 3 divisions bilaterally. Corneal responses are intact.  CN VII: Face is symmetric with normal eye closure and smile. CN VIII: Hearing is normal to rubbing fingers CN IX, X: Palate elevates symmetrically. Phonation is normal. CN XI: Head turning and shoulder shrug are intact CN XII: Tongue is midline with normal movements and no  atrophy.  MOTOR: There is no pronator drift of out-stretched arms. Muscle bulk and tone are normal. Muscle strength is normal.  REFLEXES: Reflexes are 2+ and symmetric at the biceps, triceps, knees, and ankles. Plantar responses are flexor.  SENSORY: Light touch, pinprick, position sense, and vibration sense are intact in fingers and toes.  COORDINATION: Rapid alternating movements and fine finger movements are intact.  There is no dysmetria on finger-to-nose and heel-knee-shin.   GAIT/STANCE: Antalgic gait due to left-sided low back pain, straight leg testing was positive on the left side  Romberg is absent.   DIAGNOSTIC DATA (LABS, IMAGING, TESTING) - I reviewed patient records, labs, notes, testing and imaging myself where available.   Lab Results  Component Value Date   CHOL 197 11/24/2009   HDL 45 11/24/2009   LDLCALC 135 (H) 11/24/2009   TRIG 85 11/24/2009   CHOLHDL 4.4 Ratio 11/24/2009   No results found for: HGBA1C Lab Results  Component Value Date   VITAMINB12 375 06/04/2015   Lab Results  Component Value Date   TSH 2.830 06/04/2015      ASSESSMENT AND PLAN  KALIFA CADDEN is a 30 y.o. female  with rapid weight gain, obesity, progestin implantation, presented with bilateral papillary edema,   Pseudotumor cerebri  Continued evidence of mild right papillary edema, left side is close to be normal now  Keep Topamax 150 mg twice a day, nortriptyline 50 mg every night  Continue moderate exercise, lose weight Chronic migraine  Continue magnesium oxide, riboflavin as preventive medication  Maxalt 10 mg as needed New-onset left lumbar radiculopathy  Evaluation with MRI of lumbar  Gabapentin 300 mg 3 times a day  Mobic 15 mg as needed also advised her heating pad  Return to clinic with nurse practitioner in 2-3 weeks, if her low back pain has much improved, will continue current management, if there was significant abnormality on MRI of lumbar, continued low  back pain may consider EMG nerve conduction study   Levert Feinstein, M.D. Ph.D.  [facility] [address], [address] [place], [place] [address] Ph: [phone] Fax: [phone]

## 2016-10-20 ENCOUNTER — Ambulatory Visit (INDEPENDENT_AMBULATORY_CARE_PROVIDER_SITE_OTHER): Payer: Self-pay

## 2016-10-20 DIAGNOSIS — G932 Benign intracranial hypertension: Secondary | ICD-10-CM

## 2016-10-20 DIAGNOSIS — IMO0002 Reserved for concepts with insufficient information to code with codable children: Secondary | ICD-10-CM

## 2016-10-20 DIAGNOSIS — G43709 Chronic migraine without aura, not intractable, without status migrainosus: Secondary | ICD-10-CM

## 2016-10-20 DIAGNOSIS — M5416 Radiculopathy, lumbar region: Secondary | ICD-10-CM

## 2016-10-26 ENCOUNTER — Encounter: Payer: Self-pay | Admitting: Nurse Practitioner

## 2016-10-26 ENCOUNTER — Ambulatory Visit (INDEPENDENT_AMBULATORY_CARE_PROVIDER_SITE_OTHER): Payer: Self-pay | Admitting: Nurse Practitioner

## 2016-10-26 VITALS — BP 124/78 | HR 88 | Ht 65.0 in | Wt 214.4 lb

## 2016-10-26 DIAGNOSIS — M5416 Radiculopathy, lumbar region: Secondary | ICD-10-CM

## 2016-10-26 DIAGNOSIS — IMO0002 Reserved for concepts with insufficient information to code with codable children: Secondary | ICD-10-CM

## 2016-10-26 DIAGNOSIS — G932 Benign intracranial hypertension: Secondary | ICD-10-CM

## 2016-10-26 DIAGNOSIS — G43709 Chronic migraine without aura, not intractable, without status migrainosus: Secondary | ICD-10-CM

## 2016-10-26 NOTE — Patient Instructions (Addendum)
Continued evidence of mild right papillary edema, left side is close to be normal now             Keep Topamax 150 mg twice a day, nortriptyline 50 mg every night             Continue moderate exercise, lose weight Chronic migraineContinue magnesium oxide, riboflavin as preventive medication Maxalt 10 mg as needed New-onset left lumbar radiculopathy MRI abnormal             EMG/[place]             Gabapentin 300 mg 3 times a day             Mobic 15 mg as needed also advised her heating pad Follow-up with Dr. Terrace ArabiaYan in 3 months

## 2016-10-26 NOTE — Progress Notes (Signed)
[facility]  PATIENT: Krista FickHeather L Maddox DOB: [birth year]   REASON FOR VISIT: Follow-up for pseudotumor cerebri, migraine headaches and back pain  HISTORY FROM: Patient    HISTORY OF PRESENT ILLNESS:[name] is a 30 years old right-handed female, seen in consultation by his ophthalmologist Dr. London SheerPeter Dunn for evaluation of bilateral papillary edema.  I reviewed notes from Dr. Shea Evansunn in May 28 2015, patient was seen for complains of blurry vision, especially seen distance, on examination, visual acuity right eye 20/25 minus, left 20/30 plus, bilateral papillary edema  She denied previous history of headache, since May 2016, she began to notice frequent headaches, bilateral retro-orbital area constant pressure, 8 out of 10 on daily basis, sometimes it would exacerbate to a more severe 10 out of 10 pounding headaches, with associated light noise sensitivity, nauseous, bending down or getting up quickly also causing her dizzines.  She has been taking up to 12 tablets of ibuprofen each day, which only temporarily helps her headaches,  She received etonogestrel (NEXPLANON) 68 MG IMPL implant in July 14th 2016. She complains of 25 pound weight gain since last year.  UPDATE July 28th 2016:YYWe have reviewed MRI brain: Enlarged partially empty sella. Enlarged optic nerve sheaths. Findings are non-specific but can be seen in association with idiopathic intracranial hypertension.No acute findings. No masses or hydrocephalus.  She had a fluoroscopy guided lumbar puncture in June 25 2015, with open pressure 33 cm water, she has developed post LP headaches, eventually had blood patch in June 30 2015, with immediate relief of her headaches Spinal fluid testing showed total protein 43, glucose 53, WBC 20, RBC 1, 72% lymphocytes, 24% monocytes She continue has mild blurry vision, occasionally double vision on extreme gaze to the left or right, low-grade bifrontal pressure  headaches, she has tried Maxalt as needed, which has been helpful for moderate and severe headaches. She is tolerating Topamax 100 mg twice a day well  UPDATE August 26 2015:YY She had LP in June 25 2015, open pressure was 33 cm H2O, CSF showed elevated WBC of 20, 70% of lymphocyte, RBC was 1, she developed post LP headaches, severe positional headaches eventually relieved by blood patch June 30 2015, somewhat responsive to Fioricet , with her increased headaches, she also noticed with sudden positional change, she had a worsening headaches, need to refocus She is able to tolerate Topamax 100 mg twice a day, goes to school, stay up late at night, has tried exercise regularly She began to have recurrent headaches since September 2016, bilateral frontal temporal region severe pounding headache was associated light noise sensitivity, 7/10, sometimes 10 out of 10 severe headaches, Maxalt 5 mg as needed was somewhat helpful She has gained 40 pounds since January 2016 NEXPLANON implant was put in 2015, had weight gain, ws taken out in Sep, 2016  She was evaluated by her optometrist couple weeks ago, had a new prescription  UPDATE Sep 16 2015:YYShe continues to have moderate to severe headache each week, bilateral retro-orbital area, pounding, with light noise sensitivity, Maxalt 10 mg as needed only provide limited help, her weight has been stable at 230 pounds, she is taking Topamax 150 mg twice a day, complains of numbness tingling at her fingertips  UPDATE Nov 03 2015:YY She had repeat LP  in Sep 29 2015, showed Open pressure is 26 cm H2O. 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  UPDATE Feb 02 2016:YY She had repeat lumbar puncture January 19 2016, open pressure was 23 cm water, unfortunately she developed post LP headache, failed to improve by Fioricet, eventually had blood patch January 22 2016, she was evaluated by neurosurgeon Dr. Conchita Paris for potential shunt replacement, is referred to [facility]. She is now taking Topamax 150 mg twice a day, has lost 16 pounds over 3 months, Maxalt as needed was helpful for headache  UPDATE June 01 2016:YY She still has almost daily bilateral frontal pressure headaches, couple times a month it would exacerbate to a much more severe headache, with dizziness, blurry vision, with associated light noise sensitivity, nauseous, Maxalt was helpful most of the time, she continued to losing weight, lost 10 pounds in 4 months,  UPDATE Oct 13 2016:YY She complains of one-week history of left-sided low back pain radiating pain to left leg all the way to her left foot, worsening by bearing weight, no right leg involvement no bowel and bladder incontinence, there was no trigger. She has constant dull headaches 2/10, once a week, it will exaggerated lumbar migraine headaches, Maxalt has been helpful, she has no significant visual change. Continued to lost weight tolerating Topamax 150 mg twice a day well, UPDATE 11/14/2017CM Krista Maddox, 30 year old female returns for follow-up she has a history of pseudo tumor cerebri. In addition she has a history of migraines which are in fairly good control, she uses Maxalt acutely with  relief. Her biggest complaint today is continued back pain radiating to the left leg worsened by bearing weight. She denies any bowel or bladder incontinence.Abnormal MRI scan lumbar spine  10/20/16 showing prominent disc degenerative change at L5-S1 with broad-based disc osteophyte protrusion resulting in severe bilateral foraminal narrowing and likely encroachment on exiting nerve roots. Dr. Terrace Arabia telephone note to the patient states may consider EMG nerve conduction study for further evaluation. Repeat epidural injections for pain control. Patient returns for reevaluation  REVIEW OF SYSTEMS: Full 14 system review of systems performed and notable only for those listed, all others are neg:  Constitutional: neg  Cardiovascular: neg Ear/Nose/Throat: neg  Skin: neg Eyes: neg Respiratory: neg Gastroitestinal: neg  Hematology/Lymphatic: neg  Endocrine: neg Musculoskeletal: Back pain Allergy/Immunology: neg Neurological: Headache Psychiatric: neg Sleep : neg   ALLERGIES: No Known Allergies  HOME MEDICATIONS: Outpatient Medications Prior to Visit  Medication Sig Dispense Refill  . butalbital-acetaminophen-caffeine (FIORICET, ESGIC) 50-325-40 MG per tablet Take 1 tablet by mouth every 6 (six) hours as needed for headache. 30 tablet 0  . fluticasone (FLONASE) 50 MCG/ACT nasal spray Place 1 spray into both nostrils daily. 16 g 0  . gabapentin (NEURONTIN) 300 MG capsule Take 1 capsule (300 mg total) by mouth 3 (three) times daily. 90 capsule 6  . ibuprofen (ADVIL,MOTRIN) 200 MG tablet Take 200  mg by mouth every 6 (six) hours as needed.    . Magnesium Oxide 400 (240 Mg) MG TABS Take 400 mg by mouth 2 (two) times daily. 60 tablet 11  . meloxicam (MOBIC) 15 MG tablet Take 1 tablet (15 mg total) by mouth daily. 30 tablet 6  . nortriptyline (PAMELOR) 50 MG capsule Take 1 capsule (50 mg total) by mouth at bedtime. 90 capsule 3  . ondansetron (ZOFRAN) 4 MG tablet Take 1 tablet (4 mg total) by mouth every  8 (eight) hours as needed for nausea or vomiting. 30 tablet 0  . Riboflavin 100 MG TABS Take 1 tablet (100 mg total) by mouth 2 (two) times daily. 60 tablet 11  . rizatriptan (MAXALT-MLT) 10 MG disintegrating tablet Take 1 tablet (10 mg total) by mouth as needed. May repeat in 2 hours if needed 27 tablet 3  . topiramate (TOPAMAX) 100 MG tablet TAKE ONE AND ONE-HALF TABLETS BY MOUTH TWICE DAILY 270 tablet 3   No facility-administered medications prior to visit.     PAST MEDICAL HISTORY: Past Medical History:  Diagnosis Date  . Headache   . TTP (thrombotic thrombocytopenic purpura) (HCC)     PAST SURGICAL HISTORY: Past Surgical History:  Procedure Laterality Date  . CESAREAN SECTION    . CHOLECYSTECTOMY    . WISDOM TOOTH EXTRACTION      FAMILY HISTORY: Family History  Problem Relation Age of Onset  . Diabetes Mother   . Hypertension Mother   . Ovarian cancer Mother     Hysterectomy  . Healthy Father   . Ovarian cancer Maternal Grandmother     Hysterectomy    SOCIAL HISTORY: Social History   Social History  . Marital status: Single    Spouse name: N/A  . Number of children: 1  . Years of education: In college   Occupational History  . Dispatcher    Social History Main Topics  . Smoking status: Current Every Day Smoker    Packs/day: 0.50  . Smokeless tobacco: Never Used  . Alcohol use No  . Drug use: No  . Sexual activity: Yes    Birth control/ protection: Implant   Other Topics Concern  . Not on file   Social History Narrative   Lives at home with her mother and daughter.   3-4 cups caffeine daily.   Left-handed.     PHYSICAL EXAM  Vitals:   10/26/16 1451  BP: 124/78  Pulse: 88  Weight: 214 lb 6.4 oz (97.3 kg)  Height: 5\' 5"  (1.651 m)   Body mass index is 35.68 kg/m.  Generalized: Well developed, Obese female in no acute distress  Head: normocephalic and atraumatic,. Oropharynx benign  Neck: Supple, no carotid bruits  Cardiac: Regular rate  rhythm, no murmur  Musculoskeletal: No deformity   Neurological examination   Mentation: Alert oriented to time, place, history taking. Attention span and concentration appropriate. Recent and remote memory intact.  Follows all commands speech and language fluent.   Cranial nerve II-XII: Visual acuity 20/40 bilaterally. Fundoscopic exam reveals mild blurry in the right disc only left disc sharp edge .Pupils were equal round reactive to light extraocular movements were full, visual field were full on confrontational test. Mild bilateral exophoria.  Facial sensation and strength were normal. hearing was intact to finger rubbing bilaterally. Uvula tongue midline. head turning and shoulder shrug were normal and symmetric.Tongue protrusion into cheek strength was normal. Motor: normal bulk and tone, full strength in the BUE, BLE, fine  finger movements normal, no pronator drift. Sensory: normal and symmetric to light touch, pinprick, and  Vibration, in the upper and lower extremities  Coordination: finger-nose-finger, heel-to-shin bilaterally, no dysmetria Reflexes: Brachioradialis 2/2, biceps 2/2, triceps 2/2, patellar 2/2, Achilles 2/2, plantar responses were flexor bilaterally. Gait and Station: Rising up from seated position without assistance, antalgic gait due to left-sided low back pain, unable to heel or toe walk, unsteady with tandem.  DIAGNOSTIC DATA (LABS, IMAGING, TESTING)  ASSESSMENT AND PLAN  30 y.o. year old female  has a past medical history of Headache and TTP (thrombotic thrombocytopenic purpura) (HCC). Pseudotumor cerebri with continued evidence of mild right papillary edema left side normal. Chronic migraine headaches relieved with Maxalt. Left lumbar radiculopathy with abnormal MRI of the lumbar spine 10/20/16 showing prominent disc degenerative change at L5-S1 with broad-based disc osteophyte protrusion resulting in severe bilateral foraminal narrowing and likely encroachment on  exiting nerve roots.   PLAN: Keep Topamax 150 twice a day Nortriptyline 50 mg every night Continue Maxalt 10 mg daily Continue weight loss Will order EMG nerve conduction Continue gabapentin 300 mg 3 times a day Continue Mobic 15 mg as needed Follow up with Dr. Terrace ArabiaYan in 3 months Nilda RiggsNancy Carolyn Arcelia Pals, [facility] HospitalGNP, [facility]BC, APRN  [facility] [address], [address] [place], [place] [address] [phone]

## 2016-10-27 NOTE — Progress Notes (Signed)
I have reviewed and agreed above plan. 

## 2017-01-13 ENCOUNTER — Ambulatory Visit (INDEPENDENT_AMBULATORY_CARE_PROVIDER_SITE_OTHER): Payer: Self-pay | Admitting: Nurse Practitioner

## 2017-01-13 VITALS — BP 122/82 | HR 99 | Temp 98.2°F | Wt 214.0 lb

## 2017-01-13 DIAGNOSIS — J069 Acute upper respiratory infection, unspecified: Secondary | ICD-10-CM

## 2017-01-13 MED ORDER — FLUTICASONE PROPIONATE 50 MCG/ACT NA SUSP: 2.0000 | Freq: Every day | NASAL | 6 refills | Status: DC

## 2017-01-13 NOTE — Progress Notes (Signed)
Subjective:     Krista Maddox is a 31 y.o. female who presents for evaluation of symptoms of a URI. Symptoms include achiness, headache described as frontal, throbbing, no  fever, non productive cough, sneezing and runny nose.. Onset of symptoms was a few hours ago around 11am. ago, and has been stable since that time. Treatment to date: none.  Patient does smoke, but states "only when I am under stress".  Patient is trying to quit.  Denies any recent sick contacts.  The following portions of the patient's history were reviewed and updated as appropriate: allergies, current medications and past medical history.  Review of Systems Constitutional: positive for chills and fatigue, negative for sweats Eyes: negative Ears, nose, mouth, throat, and face: negative for earaches, nasal congestion and sore throat Respiratory: positive for cough Cardiovascular: negative Neurological: positive for headaches Allergic/Immunologic: positive for hay fever   Objective:    BP 122/82   Pulse 99   Temp 98.2 F (36.8 C) (Oral)   Wt 214 lb (97.1 kg)   BMI 35.61 kg/m  General appearance: alert and cooperative Head: Normocephalic, without obvious abnormality, atraumatic Eyes: conjunctivae/corneas clear. PERRL, EOM's intact. Fundi benign. Ears: normal TM's and external ear canals both ears Nose: Nares normal. Septum midline. Mucosa normal. No drainage or sinus tenderness. Throat: lips, mucosa, and tongue normal; teeth and gums normal Lungs: expiratory wheeze in left lower lung field. Heart: regular rate and rhythm, S1, S2 normal, no murmur, click, rub or gallop Neurologic: Alert and oriented X 3, normal strength and tone. Normal symmetric reflexes. Normal coordination and gait   Assessment:    viral upper respiratory illness   Plan:    Discussed diagnosis and treatment of URI. Suggested symptomatic OTC remedies. Nasal saline spray for congestion. Follow up as needed. Flonase    OTC cough  medicine.  Use Albuterol inhaler every 4-6 hours prn wheezing.  Plenty of rest and fluids.  Follow up as needed if symptoms worsen.

## 2017-01-13 NOTE — Patient Instructions (Addendum)
Upper Respiratory Infection, Adult Most upper respiratory infections (URIs) are a viral infection of the air passages leading to the lungs. A URI affects the nose, throat, and upper air passages. The most common type of URI is nasopharyngitis and is typically referred to as "the common cold." URIs run their course and usually go away on their own. Most of the time, a URI does not require medical attention, but sometimes a bacterial infection in the upper airways can follow a viral infection. This is called a secondary infection. Sinus and middle ear infections are common types of secondary upper respiratory infections. Bacterial pneumonia can also complicate a URI. A URI can worsen asthma and chronic obstructive pulmonary disease (COPD). Sometimes, these complications can require emergency medical care and may be life threatening. What are the causes? Almost all URIs are caused by viruses. A virus is a type of germ and can spread from one person to another. What increases the risk? You may be at risk for a URI if:  You smoke.  You have chronic heart or lung disease.  You have a weakened defense (immune) system.  You are very young or very old.  You have nasal allergies or asthma.  You work in crowded or poorly ventilated areas.  You work in health care facilities or schools.  What are the signs or symptoms? Symptoms typically develop 2-3 days after you come in contact with a cold virus. Most viral URIs last 7-10 days. However, viral URIs from the influenza virus (flu virus) can last 14-18 days and are typically more severe. Symptoms may include:  Runny or stuffy (congested) nose.  Sneezing.  Cough.  Sore throat.  Headache.  Fatigue.  Fever.  Loss of appetite.  Pain in your forehead, behind your eyes, and over your cheekbones (sinus pain).  Muscle aches.  How is this diagnosed? Your health care provider may diagnose a URI by:  Physical exam.  Tests to check that your  symptoms are not due to another condition such as: ? Strep throat. ? Sinusitis. ? Pneumonia. ? Asthma.  How is this treated? A URI goes away on its own with time. It cannot be cured with medicines, but medicines may be prescribed or recommended to relieve symptoms. Medicines may help:  Reduce your fever.  Reduce your cough.  Relieve nasal congestion.  Follow these instructions at home:  Take medicines only as directed by your health care provider.  Gargle warm saltwater or take cough drops to comfort your throat as directed by your health care provider.  Use a warm mist humidifier or inhale steam from a shower to increase air moisture. This may make it easier to breathe.  Drink enough fluid to keep your urine clear or pale yellow.  Eat soups and other clear broths and maintain good nutrition.  Rest as needed.  Return to work when your temperature has returned to normal or as your health care provider advises. You may need to stay home longer to avoid infecting others. You can also use a face mask and careful hand washing to prevent spread of the virus.  Increase the usage of your inhaler if you have asthma.  Do not use any tobacco products, including cigarettes, chewing tobacco, or electronic cigarettes. If you need help quitting, ask your health care provider. How is this prevented? The best way to protect yourself from getting a cold is to practice good hygiene.  Avoid oral or hand contact with people with cold symptoms.  Wash your   hands often if contact occurs.  There is no clear evidence that vitamin C, vitamin E, echinacea, or exercise reduces the chance of developing a cold. However, it is always recommended to get plenty of rest, exercise, and practice good nutrition. Contact a health care provider if:  You are getting worse rather than better.  Your symptoms are not controlled by medicine.  You have chills.  You have worsening shortness of breath.  You have  brown or red mucus.  You have yellow or brown nasal discharge.  You have pain in your face, especially when you bend forward.  You have a fever.  You have swollen neck glands.  You have pain while swallowing.  You have white areas in the back of your throat. Get help right away if:  You have severe or persistent: ? Headache. ? Ear pain. ? Sinus pain. ? Chest pain.  You have chronic lung disease and any of the following: ? Wheezing. ? Prolonged cough. ? Coughing up blood. ? A change in your usual mucus.  You have a stiff neck.  You have changes in your: ? Vision. ? Hearing. ? Thinking. ? Mood. This information is not intended to replace advice given to you by your health care provider. Make sure you discuss any questions you have with your health care provider. Document Released: 05/25/2001 Document Revised: 08/01/2016 Document Reviewed: 03/06/2014 Elsevier Interactive Patient Education  2017 Elsevier Inc.  

## 2017-01-20 ENCOUNTER — Telehealth: Payer: Self-pay | Admitting: Nurse Practitioner

## 2017-01-20 NOTE — Telephone Encounter (Signed)
Called patient to follow up.  Spoke with patient, patient states she is feeling better but still has a cough.  Informed patient that her smoking may contribute to the continued cough.  Patient to return if no improvement or concerns.

## 2017-04-05 ENCOUNTER — Other Ambulatory Visit: Payer: Self-pay | Admitting: Neurology

## 2017-05-05 ENCOUNTER — Other Ambulatory Visit: Payer: Self-pay | Admitting: Neurology

## 2017-05-15 ENCOUNTER — Other Ambulatory Visit: Payer: Self-pay | Admitting: Neurology

## 2017-05-18 ENCOUNTER — Telehealth: Payer: Self-pay | Admitting: Neurology

## 2017-05-18 MED ORDER — NORTRIPTYLINE HCL 50 MG PO CAPS: 50.0000 mg | ORAL_CAPSULE | Freq: Every day | ORAL | 1 refills | Status: DC

## 2017-05-18 MED ORDER — MELOXICAM 15 MG PO TABS: 15.0000 mg | ORAL_TABLET | Freq: Every day | ORAL | 1 refills | Status: DC

## 2017-05-18 NOTE — Addendum Note (Signed)
Addended by: Lindell SparKIRKMAN, MICHELLE C on: 05/18/2017 05:41 PM   Modules accepted: Orders

## 2017-05-18 NOTE — Telephone Encounter (Signed)
Pt called said her medications had been refused. I relayed she needed to make an appt. 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 She is needing nortriptyline (PAMELOR) 50 MG capsule and meloxicam (MOBIC) 15 MG tablet sent to [facility] [address]. She will use [facility]. Please call the patient

## 2017-05-18 NOTE — Telephone Encounter (Signed)
Per Dr. Terrace ArabiaYan, ok to refill both prescriptions.  Pt aware this has been taken care of for her.  She was very appreciative and will call to schedule her follow up as soon as she gets her insurance issue resolved.

## 2017-08-16 ENCOUNTER — Inpatient Hospital Stay (HOSPITAL_BASED_OUTPATIENT_CLINIC_OR_DEPARTMENT_OTHER)
Admission: EM | Admit: 2017-08-16 | Discharge: 2017-08-20 | DRG: 065 | Disposition: A | Discharge status: Home / Self Care | Payer: Self-pay | Attending: Internal Medicine | Admitting: Internal Medicine

## 2017-08-16 ENCOUNTER — Encounter (HOSPITAL_BASED_OUTPATIENT_CLINIC_OR_DEPARTMENT_OTHER): Payer: Self-pay | Admitting: Emergency Medicine

## 2017-08-16 ENCOUNTER — Emergency Department (HOSPITAL_BASED_OUTPATIENT_CLINIC_OR_DEPARTMENT_OTHER): Payer: Self-pay

## 2017-08-16 DIAGNOSIS — R7989 Other specified abnormal findings of blood chemistry: Secondary | ICD-10-CM

## 2017-08-16 DIAGNOSIS — G8194 Hemiplegia, unspecified affecting left nondominant side: Secondary | ICD-10-CM | POA: Diagnosis present

## 2017-08-16 DIAGNOSIS — E119 Type 2 diabetes mellitus without complications: Secondary | ICD-10-CM | POA: Diagnosis present

## 2017-08-16 DIAGNOSIS — Z8673 Personal history of transient ischemic attack (TIA), and cerebral infarction without residual deficits: Secondary | ICD-10-CM

## 2017-08-16 DIAGNOSIS — G932 Benign intracranial hypertension: Secondary | ICD-10-CM | POA: Diagnosis present

## 2017-08-16 DIAGNOSIS — I6340 Cerebral infarction due to embolism of unspecified cerebral artery: Secondary | ICD-10-CM | POA: Insufficient documentation

## 2017-08-16 DIAGNOSIS — R590 Localized enlarged lymph nodes: Secondary | ICD-10-CM | POA: Diagnosis present

## 2017-08-16 DIAGNOSIS — E785 Hyperlipidemia, unspecified: Secondary | ICD-10-CM | POA: Diagnosis present

## 2017-08-16 DIAGNOSIS — E669 Obesity, unspecified: Secondary | ICD-10-CM | POA: Diagnosis present

## 2017-08-16 DIAGNOSIS — Z862 Personal history of diseases of the blood and blood-forming organs and certain disorders involving the immune mechanism: Secondary | ICD-10-CM

## 2017-08-16 DIAGNOSIS — I10 Essential (primary) hypertension: Secondary | ICD-10-CM | POA: Diagnosis present

## 2017-08-16 DIAGNOSIS — G43909 Migraine, unspecified, not intractable, without status migrainosus: Secondary | ICD-10-CM | POA: Diagnosis present

## 2017-08-16 DIAGNOSIS — Z6833 Body mass index (BMI) 33.0-33.9, adult: Secondary | ICD-10-CM

## 2017-08-16 DIAGNOSIS — Z79899 Other long term (current) drug therapy: Secondary | ICD-10-CM

## 2017-08-16 DIAGNOSIS — IMO0002 Reserved for concepts with insufficient information to code with codable children: Secondary | ICD-10-CM | POA: Diagnosis present

## 2017-08-16 DIAGNOSIS — R29810 Facial weakness: Secondary | ICD-10-CM | POA: Diagnosis present

## 2017-08-16 DIAGNOSIS — I63441 Cerebral infarction due to embolism of right cerebellar artery: Secondary | ICD-10-CM | POA: Diagnosis present

## 2017-08-16 DIAGNOSIS — G43709 Chronic migraine without aura, not intractable, without status migrainosus: Secondary | ICD-10-CM | POA: Diagnosis present

## 2017-08-16 DIAGNOSIS — R200 Anesthesia of skin: Secondary | ICD-10-CM | POA: Diagnosis present

## 2017-08-16 DIAGNOSIS — F17210 Nicotine dependence, cigarettes, uncomplicated: Secondary | ICD-10-CM | POA: Diagnosis present

## 2017-08-16 DIAGNOSIS — I63411 Cerebral infarction due to embolism of right middle cerebral artery: Principal | ICD-10-CM | POA: Diagnosis present

## 2017-08-16 DIAGNOSIS — R778 Other specified abnormalities of plasma proteins: Secondary | ICD-10-CM

## 2017-08-16 HISTORY — DX: Other intervertebral disc degeneration, lumbar region without mention of lumbar back pain or lower extremity pain: M51.369

## 2017-08-16 HISTORY — DX: Other disorders of optic nerve, not elsewhere classified, unspecified eye: H47.099

## 2017-08-16 HISTORY — DX: Other intervertebral disc degeneration, lumbar region: M51.36

## 2017-08-16 LAB — CBC
HEMATOCRIT: 39.5 % (ref 36.0–46.0)
Hemoglobin: 13 g/dL (ref 12.0–15.0)
MCH: 30.7 pg (ref 26.0–34.0)
MCHC: 32.9 g/dL (ref 30.0–36.0)
MCV: 93.4 fL (ref 78.0–100.0)
PLATELETS: 218 10*3/uL (ref 150–400)
RBC: 4.23 MIL/uL (ref 3.87–5.11)
RDW: 13.4 % (ref 11.5–15.5)
WBC: 9.3 10*3/uL (ref 4.0–10.5)

## 2017-08-16 LAB — DIFFERENTIAL
BASOS ABS: 0 10*3/uL (ref 0.0–0.1)
BASOS PCT: 0 %
Eosinophils Absolute: 0.2 10*3/uL (ref 0.0–0.7)
Eosinophils Relative: 2 %
LYMPHS PCT: 31 %
Lymphs Abs: 2.9 10*3/uL (ref 0.7–4.0)
Monocytes Absolute: 0.6 10*3/uL (ref 0.1–1.0)
Monocytes Relative: 6 %
NEUTROS ABS: 5.6 10*3/uL (ref 1.7–7.7)
Neutrophils Relative %: 61 %

## 2017-08-16 LAB — APTT: APTT: 30 s (ref 24–36)

## 2017-08-16 LAB — COMPREHENSIVE METABOLIC PANEL
ALK PHOS: 54 U/L (ref 38–126)
ALT: 7 U/L — AB (ref 14–54)
AST: 15 U/L (ref 15–41)
Albumin: 3.4 g/dL — ABNORMAL LOW (ref 3.5–5.0)
Anion gap: 5 (ref 5–15)
BUN: 13 mg/dL (ref 6–20)
CHLORIDE: 109 mmol/L (ref 101–111)
CO2: 24 mmol/L (ref 22–32)
CREATININE: 0.79 mg/dL (ref 0.44–1.00)
Calcium: 8.7 mg/dL — ABNORMAL LOW (ref 8.9–10.3)
GFR calc Af Amer: >60 mL/min (ref >60)
GFR calc non Af Amer: >60 mL/min (ref >60)
GLUCOSE: 95 mg/dL (ref 65–99)
Potassium: 3.7 mmol/L (ref 3.5–5.1)
SODIUM: 138 mmol/L (ref 135–145)
Total Bilirubin: 0.3 mg/dL (ref 0.3–1.2)
Total Protein: 7.1 g/dL (ref 6.5–8.1)

## 2017-08-16 LAB — URINALYSIS, ROUTINE W REFLEX MICROSCOPIC
BILIRUBIN URINE: NEGATIVE
Glucose, UA: NEGATIVE mg/dL
HGB URINE DIPSTICK: NEGATIVE
KETONES UR: NEGATIVE mg/dL
LEUKOCYTES UA: NEGATIVE
NITRITE: NEGATIVE
PH: 7 (ref 5.0–8.0)
Protein, ur: NEGATIVE mg/dL
Specific Gravity, Urine: 1.02 (ref 1.005–1.030)

## 2017-08-16 LAB — TROPONIN I
TROPONIN I: 0.11 ng/mL — AB (ref <0.03)
Troponin I: 0.12 ng/mL (ref <0.03)

## 2017-08-16 LAB — PROTIME-INR
INR: 0.88
Prothrombin Time: 11.9 seconds (ref 11.4–15.2)

## 2017-08-16 LAB — PREGNANCY, URINE: Preg Test, Ur: NEGATIVE

## 2017-08-16 LAB — CBG MONITORING, ED: Glucose-Capillary: 92 mg/dL (ref 65–99)

## 2017-08-16 NOTE — ED Notes (Signed)
Dr. tegeler and pt nurse Windell MouldingRuth alerted to troponin of 0.12

## 2017-08-16 NOTE — Plan of Care (Signed)
31 yo F with L sided numbness.  Kirkpatrick recs MRI brain with and without.  Will send to tele obs.

## 2017-08-16 NOTE — ED Triage Notes (Signed)
At 2pm yesterday pt started tingling all over her body, got dizzy, and the left side of her body went numb. It went away for a short while but came back and has been constant since yesterday.  Sts her whole left side is intermittently numb and then tingly.  Sts the left side of her face is numb and she cannot feel it - since yesterday. Pt drove to ED.  Walks with a brisk gait.  Sts she has had episodes of dizziness over the past few months.

## 2017-08-16 NOTE — ED Notes (Signed)
ED Provider at bedside. 

## 2017-08-16 NOTE — ED Notes (Signed)
Patient transported to CT 

## 2017-08-16 NOTE — ED Provider Notes (Signed)
[facility] Provider Note   CSN: [id number] Arrival date & time: 08/16/17  1732     History   Chief Complaint Chief Complaint  Patient presents with  . Numbness    HPI Krista Maddox is a 31 y.o. female.  The history is provided by the patient and medical records.  Neurologic Problem  This is a new problem. The current episode started yesterday. The problem occurs constantly. The problem has not changed since onset.Pertinent negatives include no chest pain, no abdominal pain, no headaches and no shortness of breath. Nothing aggravates the symptoms. Nothing relieves the symptoms. She has tried nothing for the symptoms. The treatment provided no relief.    Past Medical History:  Diagnosis Date  . DDD (degenerative disc disease), lumbar   . Headache   . Other disorders of optic nerve, not elsewhere classified, unspecified eye   . TTP (thrombotic thrombocytopenic purpura) [facility])     Patient Active Problem List   Diagnosis Date Noted  . Acute left lumbar radiculopathy 10/13/2016  . Chronic migraine 06/01/2016  . Pseudotumor cerebri 11/03/2015  . DENTAL PAIN 06/09/2010  . HEADACHE, TENSION 02/04/2010  . SCABIES 01/13/2010  . FOOT PAIN, LEFT 01/13/2010  . CERUMEN IMPACTION, BILATERAL 11/24/2009  . PSORIASIS 11/24/2009  . TOBACCO ABUSE 10/15/2009  . 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 PYELONEPHRITIS 10/01/2009  . 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 CESAREAN SECTION    . CHOLECYSTECTOMY    . WISDOM TOOTH EXTRACTION      OB History    No data available       Home Medications    Prior to Admission medications   Medication Sig Start Date End Date Taking? Authorizing Provider  butalbital-acetaminophen-caffeine (FIORICET, ESGIC) 50-325-40 MG per tablet Take 1 tablet by mouth every 6 (six) hours as needed for headache. 06/26/15   Levert Feinstein, MD  fluticasone (FLONASE) 50 MCG/ACT nasal  spray Place 1 spray into both nostrils daily. Patient not taking: Reported on 01/13/2017 02/26/15   Ward, Layla Maw, DO  fluticasone (FLONASE) 50 MCG/ACT nasal spray Place 2 sprays into both nostrils daily. 01/13/17 01/23/17  Benay Pike, NP  gabapentin (NEURONTIN) 300 MG capsule Take 1 capsule (300 mg total) by mouth 3 (three) times daily. 10/13/16   Levert Feinstein, MD  ibuprofen (ADVIL,MOTRIN) 200 MG tablet Take 200 mg by mouth every 6 (six) hours as needed.    [provider]  Magnesium Oxide 400 (240 Mg) MG TABS Take 400 mg by mouth 2 (two) times daily. 06/01/16   Levert Feinstein, MD  meloxicam (MOBIC) 15 MG tablet Take 1 tablet (15 mg total) by mouth daily. Please call [phone] to scheduled appt. 05/18/17   Levert Feinstein, MD  norgestimate-ethinyl estradiol (MONONESSA) 0.25-35 MG-MCG tablet Take 1 tablet by mouth daily.    [provider]  nortriptyline (PAMELOR) 50 MG capsule Take 1 capsule (50 mg total) by mouth at bedtime. 05/18/17   Levert Feinstein, MD  ondansetron (ZOFRAN) 4 MG tablet Take 1 tablet (4 mg total) by mouth every 8 (eight) hours as needed for nausea or vomiting. Patient not taking: Reported on 01/13/2017 06/26/15   Levert Feinstein, MD  Riboflavin 100 MG TABS Take 1 tablet (100 mg total) by mouth 2 (two) times daily. 06/01/16   Levert Feinstein, MD  rizatriptan (MAXALT-MLT) 10 MG disintegrating tablet Take 1 tablet (10 mg total) by mouth as needed. May repeat in 2 hours if needed  01/05/16   Levert Feinstein, MD  topiramate (TOPAMAX) 100 MG tablet TAKE ONE AND ONE-HALF TABLETS BY MOUTH TWICE DAILY 07/26/16   Levert Feinstein, MD    Family History Family History  Problem Relation Age of Onset  . Diabetes Mother   . Hypertension Mother   . Ovarian cancer Mother        Hysterectomy  . Healthy Father   . Ovarian cancer Maternal Grandmother        Hysterectomy    Social History Social History  Substance Use Topics  . Smoking status: Current Every Day Smoker    Packs/day: 0.50  . Smokeless tobacco:  Never Used  . Alcohol use No     Allergies   Patient has no known allergies.   Review of Systems Review of Systems  Constitutional: Negative for appetite change, chills, diaphoresis, fatigue and fever.  HENT: Negative for congestion.   Eyes: Negative for visual disturbance.  Respiratory: Negative for cough, chest tightness, shortness of breath, wheezing and stridor.   Cardiovascular: Negative for chest pain, palpitations and leg swelling.  Gastrointestinal: Negative for abdominal pain, constipation, diarrhea, nausea and vomiting.  Genitourinary: Negative for dysuria and flank pain.  Musculoskeletal: Negative for back pain, neck pain and neck stiffness.  Skin: Negative for rash and wound.  Neurological: Positive for numbness. Negative for dizziness, facial asymmetry, weakness, light-headedness and headaches.  Psychiatric/Behavioral: Negative for agitation.  All other systems reviewed and are negative.    Physical Exam Updated Vital Signs BP (!) 145/103 (BP Location: Right Arm)   Pulse 82   Temp 98.6 F (37 C) (Oral)   Resp 16   Ht 5\' 5"  (1.651 m)   Wt 90.7 kg (200 lb)   LMP 07/30/2017 (Approximate)   SpO2 100%   BMI 33.28 kg/m   Physical Exam  Constitutional: She is oriented to person, place, and time. She appears well-developed and well-nourished. No distress.  HENT:  Head: Normocephalic and atraumatic.  Mouth/Throat: Oropharynx is clear and moist. No oropharyngeal exudate.  Eyes: Pupils are equal, round, and reactive to light. Conjunctivae and EOM are normal.  Neck: Normal range of motion.  Cardiovascular: Normal rate and intact distal pulses.   No murmur heard. Pulmonary/Chest: Effort normal and breath sounds normal. No stridor. No respiratory distress. She has no wheezes. She has no rales. She exhibits no tenderness.  Abdominal: Soft. She exhibits no distension.  Musculoskeletal: Normal range of motion. She exhibits no edema or tenderness.  Neurological: She is  alert and oriented to person, place, and time. She has normal strength. She is not disoriented. She displays no tremor. A cranial nerve deficit and sensory deficit is present. She exhibits abnormal muscle tone. Coordination normal.  Left sided facial numbness. No left arm or left leg numbness on my exam. Subtle left facial droop and the lower face. Normal strength in the upper face.  Normal visual fields, normal extraocular movements. Normal pupils. No bitemporal hemianopsia.  Skin: Capillary refill takes less than 2 seconds. No rash noted. She is not diaphoretic. No erythema.  Psychiatric: She has a normal mood and affect.  Nursing note and vitals reviewed.    ED Treatments / Results  Labs (all labs ordered are listed, but only abnormal results are displayed) Labs Reviewed  COMPREHENSIVE METABOLIC PANEL - Abnormal; Notable for the following:       Result Value   Calcium 8.7 (*)    Albumin 3.4 (*)    ALT 7 (*)    All other  components within normal limits  TROPONIN I - Abnormal; Notable for the following:    Troponin I 0.12 (*)    All other components within normal limits  URINALYSIS, ROUTINE W REFLEX MICROSCOPIC - Abnormal; Notable for the following:    APPearance CLOUDY (*)    All other components within normal limits  TROPONIN I - Abnormal; Notable for the following:    Troponin I 0.11 (*)    All other components within normal limits  PROTIME-INR  APTT  CBC  DIFFERENTIAL  PREGNANCY, URINE  CBG MONITORING, ED    EKG  EKG Interpretation  Date/Time:  Tuesday August 16 2017 18:41:17 EDT Ventricular Rate:  74 PR Interval:  126 QRS Duration: 92 QT Interval:  364 QTC Calculation: 404 R Axis:   86 Text Interpretation:  Normal sinus rhythm with sinus arrhythmia Normal ECG Whencompared to prior, no significant change seen.  No STEMI Confirmed by Theda Belfast ([id number]) on 08/16/2017 7:15:08 PM       Radiology Ct Head Wo Contrast  Result Date: 08/16/2017 CLINICAL DATA:   Headache and LEFT-sided numbness yesterday, intermittent similar symptoms today. History of TTP. EXAM: CT HEAD WITHOUT CONTRAST TECHNIQUE: Contiguous axial images were obtained from the base of the skull through the vertex without intravenous contrast. COMPARISON:  MRI of the head June 11, 2015 FINDINGS: BRAIN: No intraparenchymal hemorrhage, mass effect nor midline shift. The ventricles and sulci are normal. No acute large vascular territory infarcts. No abnormal extra-axial fluid collections. Basal cisterns are patent. VASCULAR: Unremarkable. SKULL/SOFT TISSUES: No skull fracture. No significant soft tissue swelling. ORBITS/SINUSES: The included ocular globes and orbital contents are normal.The mastoid aircells and included paranasal sinuses are well-aerated. OTHER: None. IMPRESSION: Normal noncontrast CT HEAD. Electronically Signed   By: Awilda Metro M.D.   On: 08/16/2017 18:49    Procedures Procedures (including critical care time)  Medications Ordered in ED Medications - No data to display   Initial Impression / Assessment and Plan / ED Course  I have reviewed the triage vital signs and the nursing notes.  Pertinent labs & imaging results that were available during my care of the patient were reviewed by me and considered in my medical decision making (see chart for details).     Krista Maddox is a 31 y.o. female with a past medical history significant for preeclampsia and TTP in a setting of pregnancy, prior optic neuritis, and possible history of pseudotumor cerebri who presents with dizziness and left-sided numbness. Patient says that at 2 PM yesterday, she had sudden onset of dizziness sensation. She denies the dizziness as a room spinning or lightheadedness sensation but instead feels like she had the feeling of "out of body" feeling. She says that after this, she had sudden onset of left hemibody numbness of the face, left arm, and left leg. She says that the left arm and left  leg have improved but she is having persistent left facial numbness. She says she can barely feel anything on her left face. She reports that she may have had some slurred speech that has also resolved. She denies any recent traumatic head injuries. She denies any vision changes. She denies any new medications or other complaints. She specifically denies any fevers, chills, neck pain, neck stiffness, chest pain, shortness of breath, palpitations. She denies leg pain or leg swelling. She denies any other symptoms on arrival.  On exam, patient found to have left facial numbness. Left arm and left leg numbness have resolved. Unremarkable extremity  exam and upper and lower extremities. Normal coordination and sensation in the extremities. Normal extraocular movements and normal visual field testing. No bitemporal hemianopsia. Patient did however have very subtle left lower facial droop with smile. Strong upper left face.  As patient is greater than 24 hours since onset of symptoms, she was not made a code stroke. She however received all of the stroke orders. Patient had CT head showing no significant abnormality. Laboratory testing returned grossly unremarkable aside from elevated troponin. Unsure etiology of the troponin given her lack of cardiac or chest symptoms.  One consideration is the patient may have had an arrhythmia that caused her symptoms and may have caused demand ischemia however, on telemetry patient has had no significant abnormalities during her ED stay.  Neurology was called and recommended admission for further management of the troponin as well as for MRI. Dr. Amada JupiterKirkpatrick with neurology recommended MRI with and without contrast of the brain for further evaluation of stroke.  Patient informed of this and agreed with admission. [facility].    Final Clinical Impressions(s) / ED Diagnoses   Final diagnoses:  Left sided numbness  Elevated  troponin    New Prescriptions New Prescriptions   No medications on file   Clinical Impression: 1. Left sided numbness   2. Elevated troponin     Disposition: Admit to hospitalist service    Tegeler, Canary Brimhristopher J, MD 08/17/17 [phone]

## 2017-08-17 ENCOUNTER — Observation Stay (HOSPITAL_COMMUNITY): Payer: Self-pay

## 2017-08-17 ENCOUNTER — Encounter (HOSPITAL_COMMUNITY): Payer: Self-pay | Admitting: General Practice

## 2017-08-17 DIAGNOSIS — G932 Benign intracranial hypertension: Secondary | ICD-10-CM

## 2017-08-17 DIAGNOSIS — R200 Anesthesia of skin: Secondary | ICD-10-CM

## 2017-08-17 DIAGNOSIS — I639 Cerebral infarction, unspecified: Secondary | ICD-10-CM

## 2017-08-17 DIAGNOSIS — I6340 Cerebral infarction due to embolism of unspecified cerebral artery: Secondary | ICD-10-CM | POA: Insufficient documentation

## 2017-08-17 DIAGNOSIS — G43709 Chronic migraine without aura, not intractable, without status migrainosus: Secondary | ICD-10-CM

## 2017-08-17 LAB — HIV ANTIBODY (ROUTINE TESTING W REFLEX): HIV Screen 4th Generation wRfx: NONREACTIVE

## 2017-08-17 MED ORDER — TOPIRAMATE 25 MG PO TABS
150.0000 mg | ORAL_TABLET | Freq: Two times a day (BID) | ORAL | Status: DC
  Administered 2017-08-17 – 2017-08-20 (×7): 150 mg via ORAL
  Filled 2017-08-17 (×8): qty 1

## 2017-08-17 MED ORDER — ACETAMINOPHEN 325 MG PO TABS
650.0000 mg | ORAL_TABLET | Freq: Four times a day (QID) | ORAL | Status: DC | PRN
  Administered 2017-08-17 – 2017-08-18 (×2): 650 mg via ORAL
  Filled 2017-08-17 (×2): qty 2

## 2017-08-17 MED ORDER — IOPAMIDOL (ISOVUE-370) INJECTION 76%
INTRAVENOUS | Status: AC
  Administered 2017-08-17: 50 mL
  Filled 2017-08-17: qty 50

## 2017-08-17 MED ORDER — MAGNESIUM OXIDE 400 (241.3 MG) MG PO TABS
400.0000 mg | ORAL_TABLET | Freq: Two times a day (BID) | ORAL | Status: DC
  Administered 2017-08-17 – 2017-08-20 (×8): 400 mg via ORAL
  Filled 2017-08-17 (×8): qty 1

## 2017-08-17 MED ORDER — NORTRIPTYLINE HCL 25 MG PO CAPS
50.0000 mg | ORAL_CAPSULE | Freq: Every day | ORAL | Status: DC
  Administered 2017-08-17 – 2017-08-19 (×4): 50 mg via ORAL
  Filled 2017-08-17 (×4): qty 2

## 2017-08-17 MED ORDER — ONDANSETRON HCL 4 MG/2ML IJ SOLN: 4.0000 mg | Freq: Four times a day (QID) | INTRAMUSCULAR | Status: DC | PRN

## 2017-08-17 MED ORDER — IBUPROFEN 200 MG PO TABS
200.0000 mg | ORAL_TABLET | Freq: Four times a day (QID) | ORAL | Status: DC | PRN
  Administered 2017-08-17 – 2017-08-19 (×2): 200 mg via ORAL
  Filled 2017-08-17 (×2): qty 1

## 2017-08-17 MED ORDER — STROKE: EARLY STAGES OF RECOVERY BOOK
Freq: Once | Status: AC
  Administered 2017-08-17: 1

## 2017-08-17 MED ORDER — GABAPENTIN 300 MG PO CAPS
300.0000 mg | ORAL_CAPSULE | Freq: Three times a day (TID) | ORAL | Status: DC
Start: 2017-08-17 — End: 2017-08-20
  Administered 2017-08-17 – 2017-08-20 (×10): 300 mg via ORAL
  Filled 2017-08-17 (×11): qty 1

## 2017-08-17 MED ORDER — GADOBENATE DIMEGLUMINE 529 MG/ML IV SOLN
15.0000 mL | Freq: Once | INTRAVENOUS | Status: AC | PRN
  Administered 2017-08-17: 15 mL via INTRAVENOUS

## 2017-08-17 MED ORDER — MELOXICAM 7.5 MG PO TABS
15.0000 mg | ORAL_TABLET | Freq: Every day | ORAL | Status: DC
  Administered 2017-08-17 – 2017-08-20 (×4): 15 mg via ORAL
  Filled 2017-08-17 (×4): qty 2

## 2017-08-17 MED ORDER — HEPARIN SODIUM (PORCINE) 5000 UNIT/ML IJ SOLN
5000.0000 [IU] | Freq: Three times a day (TID) | INTRAMUSCULAR | Status: DC
  Administered 2017-08-17 – 2017-08-20 (×8): 5000 [IU] via SUBCUTANEOUS
  Filled 2017-08-17 (×8): qty 1

## 2017-08-17 MED ORDER — ONDANSETRON HCL 4 MG PO TABS: 4.0000 mg | ORAL_TABLET | Freq: Four times a day (QID) | ORAL | Status: DC | PRN

## 2017-08-17 MED ORDER — ACETAMINOPHEN 650 MG RE SUPP: 650.0000 mg | Freq: Four times a day (QID) | RECTAL | Status: DC | PRN

## 2017-08-17 NOTE — H&P (Signed)
History and Physical    Krista Maddox ZOX:[id number] DOB: 1986/08/30 DOA: 08/16/2017  PCP: Patient, No Pcp Per  Patient coming from: Home  I have personally briefly reviewed patient's old medical records in [facility]  Chief Complaint: Numbness  HPI: Krista Maddox is a 31 y.o. female with medical history significant of TTP, IIH, migraines.  Patient presents to the ED at [facility] with c/o new onset L sided numbness.  Symptoms are intermittent.  L side of body and face are numb she says.  Mild headache.   ED Course: CT head negative.   Review of Systems: As per HPI otherwise 10 point review of systems negative.   Past Medical History:  Diagnosis Date  . DDD (degenerative disc disease), lumbar   . Headache   . Other disorders of optic nerve, not elsewhere classified, unspecified eye   . TTP (thrombotic thrombocytopenic purpura) (HCC)     Past Surgical History:  Procedure Laterality Date  . CESAREAN SECTION    . CHOLECYSTECTOMY    . WISDOM TOOTH EXTRACTION       reports that she has been smoking.  She has been smoking about 0.50 packs per day. She has never used smokeless tobacco. She reports that she does not drink alcohol or use drugs.  No Known Allergies  Family History  Problem Relation Age of Onset  . Diabetes Mother   . Hypertension Mother   . Ovarian cancer Mother        Hysterectomy  . Healthy Father   . Ovarian cancer Maternal Grandmother        Hysterectomy     Prior to Admission medications   Medication Sig Start Date End Date Taking? Authorizing Provider  butalbital-acetaminophen-caffeine (FIORICET, ESGIC) 50-325-40 MG per tablet Take 1 tablet by mouth every 6 (six) hours as needed for headache. 06/26/15   Levert Feinstein, MD  fluticasone (FLONASE) 50 MCG/ACT nasal spray Place 2 sprays into both nostrils daily. 01/13/17 01/23/17  Benay Pike, NP  gabapentin (NEURONTIN) 300 MG capsule Take 1 capsule (300 mg total) by mouth 3 (three) times  daily. 10/13/16   Levert Feinstein, MD  ibuprofen (ADVIL,MOTRIN) 200 MG tablet Take 200 mg by mouth every 6 (six) hours as needed.    [provider]  Magnesium Oxide 400 (240 Mg) MG TABS Take 400 mg by mouth 2 (two) times daily. 06/01/16   Levert Feinstein, MD  meloxicam (MOBIC) 15 MG tablet Take 1 tablet (15 mg total) by mouth daily. Please call [phone] to scheduled appt. 05/18/17   Levert Feinstein, MD  norgestimate-ethinyl estradiol (MONONESSA) 0.25-35 MG-MCG tablet Take 1 tablet by mouth daily.    [provider]  nortriptyline (PAMELOR) 50 MG capsule Take 1 capsule (50 mg total) by mouth at bedtime. 05/18/17   Levert Feinstein, MD  Riboflavin 100 MG TABS Take 1 tablet (100 mg total) by mouth 2 (two) times daily. 06/01/16   Levert Feinstein, MD  rizatriptan (MAXALT-MLT) 10 MG disintegrating tablet Take 1 tablet (10 mg total) by mouth as needed. May repeat in 2 hours if needed 01/05/16   Levert Feinstein, MD  topiramate (TOPAMAX) 100 MG tablet TAKE ONE AND ONE-HALF TABLETS BY MOUTH TWICE DAILY 07/26/16   Levert Feinstein, MD    Physical Exam: Vitals:   08/16/17 2300 08/16/17 2330 08/17/17 0000 08/17/17 0130  BP: 123/85 113/76 119/81 134/86  Pulse: 90 81 86 85  Resp: 15 18 20 19   Temp:    98.2 F (36.8 C)  TempSrc:    Oral  SpO2: 100% 97% 99% 99%  Weight:      Height:        Constitutional: NAD, calm, comfortable Eyes: PERRL, lids and conjunctivae normal ENMT: Mucous membranes are moist. Posterior pharynx clear of any exudate or lesions.Normal dentition.  Neck: normal, supple, no masses, no thyromegaly Respiratory: clear to auscultation bilaterally, no wheezing, no crackles. Normal respiratory effort. No accessory muscle use.  Cardiovascular: Regular rate and rhythm, no murmurs / rubs / gallops. No extremity edema. 2+ pedal pulses. No carotid bruits.  Abdomen: no tenderness, no masses palpated. No hepatosplenomegaly. Bowel sounds positive.  Musculoskeletal: no clubbing / cyanosis. No joint deformity upper and  lower extremities. Good ROM, no contractures. Normal muscle tone.  Skin: no rashes, lesions, ulcers. No induration Neurologic: ? L sided facial droop, very minor if present Psychiatric: Normal judgment and insight. Alert and oriented x 3. Normal mood.    Labs on Admission: I have personally reviewed following labs and imaging studies  CBC:  Recent Labs Lab 08/16/17 1810  WBC 9.3  NEUTROABS 5.6  HGB 13.0  HCT 39.5  MCV 93.4  PLT 218   Basic Metabolic Panel:  Recent Labs Lab 08/16/17 1810  NA 138  K 3.7  CL 109  CO2 24  GLUCOSE 95  BUN 13  CREATININE 0.79  CALCIUM 8.7*   GFR: Estimated Creatinine Clearance: 113.4 mL/min (by C-G formula based on SCr of 0.79 mg/dL). Liver Function Tests:  Recent Labs Lab 08/16/17 1810  AST 15  ALT 7*  ALKPHOS 54  BILITOT 0.3  PROT 7.1  ALBUMIN 3.4*   No results for input(s): LIPASE, AMYLASE in the last 168 hours. No results for input(s): AMMONIA in the last 168 hours. Coagulation Profile:  Recent Labs Lab 08/16/17 1810  INR 0.88   Cardiac Enzymes:  Recent Labs Lab 08/16/17 1810 08/16/17 2240  TROPONINI 0.12* 0.11*   BNP (last 3 results) No results for input(s): PROBNP in the last 8760 hours. HbA1C: No results for input(s): HGBA1C in the last 72 hours. CBG:  Recent Labs Lab 08/16/17 1841  GLUCAP 92   Lipid Profile: No results for input(s): CHOL, HDL, LDLCALC, TRIG, CHOLHDL, LDLDIRECT in the last 72 hours. Thyroid Function Tests: No results for input(s): TSH, T4TOTAL, FREET4, T3FREE, THYROIDAB in the last 72 hours. Anemia Panel: No results for input(s): VITAMINB12, FOLATE, FERRITIN, TIBC, IRON, RETICCTPCT in the last 72 hours. Urine analysis:    Component Value Date/Time   COLORURINE YELLOW 08/16/2017 1815   APPEARANCEUR CLOUDY (A) 08/16/2017 1815   LABSPEC 1.020 08/16/2017 1815   PHURINE 7.0 08/16/2017 1815   GLUCOSEU NEGATIVE 08/16/2017 1815   HGBUR NEGATIVE 08/16/2017 1815   HGBUR negative  11/24/2009 0847   BILIRUBINUR NEGATIVE 08/16/2017 1815   KETONESUR NEGATIVE 08/16/2017 1815   PROTEINUR NEGATIVE 08/16/2017 1815   UROBILINOGEN 0.2 03/13/2012 0023   NITRITE NEGATIVE 08/16/2017 1815   LEUKOCYTESUR NEGATIVE 08/16/2017 1815    Radiological Exams on Admission: Ct Head Wo Contrast  Result Date: 08/16/2017 CLINICAL DATA:  Headache and LEFT-sided numbness yesterday, intermittent similar symptoms today. History of TTP. EXAM: CT HEAD WITHOUT CONTRAST TECHNIQUE: Contiguous axial images were obtained from the base of the skull through the vertex without intravenous contrast. COMPARISON:  MRI of the head June 11, 2015 FINDINGS: BRAIN: No intraparenchymal hemorrhage, mass effect nor midline shift. The ventricles and sulci are normal. No acute large vascular territory infarcts. No abnormal extra-axial fluid collections. Basal cisterns are patent. VASCULAR:  Unremarkable. SKULL/SOFT TISSUES: No skull fracture. No significant soft tissue swelling. ORBITS/SINUSES: The included ocular globes and orbital contents are normal.The mastoid aircells and included paranasal sinuses are well-aerated. OTHER: None. IMPRESSION: Normal noncontrast CT HEAD. Electronically Signed   By: Awilda Metroourtnay  Bloomer M.D.   On: 08/16/2017 18:49    EKG: Independently reviewed.  Assessment/Plan Principal Problem:   Numbness on left side Active Problems:   Pseudotumor cerebri   Chronic migraine    1. L sided Numbness - 1. DDx includes complex migraine, IIH, MS, ischemic stroke 2. MRI brain w/wo 2. H/o IIH and Chronic migraines in past  DVT prophylaxis: Heparin [place] Code Status: Full Family Communication: No family in room Disposition Plan: Home Consults called: Neuro Admission status: Place in 20obs   Hillary BowGARDNER, JARED M. DO [facility] Pager [phone]  If 7AM-7PM, please contact day team taking care of patient [email or web address] Password [email or web address]  08/17/2017, 2:06 AM

## 2017-08-17 NOTE — Consult Note (Signed)
Neurology Consultation Reason for Consult: Left-sided weakness Referring Physician: Laban EmperorGardner, J  CC: Left-sided weakness  History is obtained from: Patient  HPI: Krista Maddox is a 31 y.o. female with a history of TTP, pseudotumor cerebri who presents with left-sided weakness that started abruptly days ago. States that she had a sudden onset of dizziness in the left side when abruptly numb.   LKW: Monday tpa given?: no, outside of window    ROS: A 14 point ROS was performed and is negative except as noted in the HPI.   Past Medical History:  Diagnosis Date  . DDD (degenerative disc disease), lumbar   . Headache   . Other disorders of optic nerve, not elsewhere classified, unspecified eye   . TTP (thrombotic thrombocytopenic purpura) (HCC)      Family History  Problem Relation Age of Onset  . Diabetes Mother   . Hypertension Mother   . Ovarian cancer Mother        Hysterectomy  . Healthy Father   . Ovarian cancer Maternal Grandmother        Hysterectomy     Social History:  reports that she has been smoking.  She has been smoking about 0.50 packs per day. She has never used smokeless tobacco. She reports that she does not drink alcohol or use drugs.   Exam: Current vital signs: BP 134/86 (BP Location: Left Arm)   Pulse 85   Temp 98.2 F (36.8 C) (Oral)   Resp 19   Ht 5\' 5"  (1.651 m)   Wt 90.7 kg (200 lb)   LMP 07/30/2017 (Approximate)   SpO2 99%   BMI 33.28 kg/m  Vital signs in last 24 hours: Temp:  [98.2 F (36.8 C)-98.6 F (37 C)] 98.2 F (36.8 C) (09/05 0130) Pulse Rate:  [79-99] 85 (09/05 0130) Resp:  [14-24] 19 (09/05 0130) BP: (113-145)/(76-103) 134/86 (09/05 0130) SpO2:  [97 %-100 %] 99 % (09/05 0130) Weight:  [90.7 kg (200 lb)] 90.7 kg (200 lb) (09/04 1742)   Physical Exam  Constitutional: Appears well-developed and well-nourished.  Psych: Affect appropriate to situation Eyes: No scleral injection HENT: No OP obstrucion Head:  Normocephalic.  Cardiovascular: Normal rate and regular rhythm.  Respiratory: Effort normal and breath sounds normal to anterior ascultation GI: Soft.  No distension. There is no tenderness.  Skin: WDI  Neuro: Mental Status: Patient is awake, alert, oriented to person, place, month, year, and situation. Patient is able to give a clear and coherent history. No signs of aphasia or neglect Cranial Nerves: II: Visual Fields are full. Pupils are equal, round, and reactive to light.   III,IV, VI: EOMI without ptosis or diploplia.  V: Facial sensation is decreased on the left side of the face VII: Facial movement is notable for left lower facial droop VIII: hearing is intact to voice X: Uvula elevates symmetrically XI: Shoulder shrug is symmetric. XII: tongue is midline without atrophy or fasciculations.  Motor: Tone is normal. Bulk is normal. 4+/5 left arm weakness, intact strength in the leg. Sensory: Sensation is mildly diminished in the left arm and leg Deep Tendon Reflexes: 2+ and symmetric in the biceps and patellae.  Cerebellar: FNF and HKS are intact bilaterally   I have reviewed labs in epic and the results pertinent to this consultation are: CMP-unremarkable  I have reviewed the images obtained: CT head-unremarkable  Impression: 31 year old female with abrupt onset of dizziness, left-sided numbness. She has chronic migraines, and has not noticed significant difference in her  headaches though she does have one. She describes paresthesia of her hand and foot. The acuity onset is concerning for acute ischemic stroke, but the presence of waxing/waning paresthesias and headache could be consistent with migraine. If her MRI is negative, I would favor a trial of treatment as migraine.  Recommendations: 1) CTA head and neck 2) MRI brain 3) further recommendations following above studies   Ritta Slot, MD [facility] [phone]  If 7pm- 7am, please page  neurology on call as listed in AMION.

## 2017-08-17 NOTE — Progress Notes (Signed)
Pt without a PCP or insurance. CM met with the patient and inquired about getting her connected with one of the [place]. She was interested. CM was able to obtain her an appointment at the [facility]. Information on the AVS. CM also educated her on the use of the [facility] pharmacy to assist with the cost of medications. CM continuing to follow.

## 2017-08-17 NOTE — Progress Notes (Signed)
Patient admitted after midnight, please see H&P.  MRI pending.  Patient does appear to have an asymmetrical smile.  Also c/o numbness on left face and hand.  Marlin CanaryJessica Green Quincy DO

## 2017-08-17 NOTE — Evaluation (Deleted)
Clinical/Bedside Swallow Evaluation Patient Details  Name: Krista Maddox MRN: [id number] Date of Birth: 1986-02-04  Today's Date: 08/17/2017 Time: SLP Start Time (ACUTE ONLY): 1550 SLP Stop Time (ACUTE ONLY): 1606 SLP Time Calculation (min) (ACUTE ONLY): 16 min  Past Medical History:  Past Medical History:  Diagnosis Date  . DDD (degenerative disc disease), lumbar   . Headache   . Other disorders of optic nerve, not elsewhere classified, unspecified eye   . TTP (thrombotic thrombocytopenic purpura) (HCC)    Past Surgical History:  Past Surgical History:  Procedure Laterality Date  . CESAREAN SECTION    . CHOLECYSTECTOMY    . WISDOM TOOTH EXTRACTION     HPI:  31 y/o female admitted secondary to L sided numbness. 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 PMH migraines.   Assessment / Plan / Recommendation Clinical Impression  Pt presents with normal expression/comprehension; speech is clear and fluent.  She does present with decreased divergent thinking, mild deficits in working memory, and difficulty with Furniture conservator/restorerspatial construction during a clock-drawing task.  Pt would benefit from more in-depth cognitive assessment after D/C, particularly given her youth, responsibilities, and current status as a full-time Archivistcollege student.  SLP Visit Diagnosis: Cognitive communication deficit (R41.841)    Aspiration Risk       Diet Recommendation          Other  Recommendations     Follow up Recommendations   SLP services to address cognitive deficits     Frequency and Duration            Prognosis        Swallow Study   General Date of Onset: 08/15/17 HPI: 31 y/o female admitted secondary to L sided numbness. 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 PMH including but not limited to migraines.    Oral/Motor/Sensory Function Overall Oral Motor/Sensory Function: Other (comment) (decreased  sensation left face)   Ice Chips     Thin Liquid      Nectar Thick     Honey Thick     Puree     Solid   GO        Functional Assessment Tool Used: clinical judgment Functional Limitations: Memory Memory Current Status (W0981(G9168): At least 1 percent but less than 20 percent impaired, limited or restricted Memory Goal Status (X9147(G9169): At least 1 percent but less than 20 percent impaired, limited or restricted Memory Discharge Status [phone](G9170): At least 1 percent but less than 20 percent impaired, limited or restricted   Blenda MountsCouture, Krista Maddox 08/17/2017,4:18 PM

## 2017-08-17 NOTE — Progress Notes (Signed)
Same day chart review Neurology Progress Note  MRI  Reviewed. Embolic appearing strokes in right cerebral and right cerebellar hemisphere.  Rec: HgbA1c, fasting lipid panel Frequent neuro checks Echocardiogram Prophylactic therapy-Antiplatelet med: Aspirin - dose 325mg  PO or 300mg  PR Risk factor modification Telemetry monitoring PT consult, OT consult, Speech consult Will benefit from hypercoagulable workup as deeemed appropriate by stroke team.  please page stroke NP  Or  PA  Or MD  from 8am -4 pm as this patient will be followed by the stroke team at this point.   You can look them up on [email or web address]     No charge note   Milon DikesAshish Cary Wilford, MD [facility] [phone]  If 7pm to 7am, please call on call as listed on AMION.

## 2017-08-17 NOTE — Evaluation (Signed)
Physical Therapy Evaluation & Discharge Patient Details Name: Krista Maddox MRN: [id number] DOB: 1986/11/15 Today's Date: 08/17/2017   History of Present Illness  Pt is a 31 y/o female admitted secondary to L sided numbness. 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 PMH including but not limited to migraines.  Clinical Impression  Pt presented supine in bed with HOB elevated, awake and willing to participate in therapy session. Prior to admission, pt reported that she was independent with all functional mobility and ADLs. Pt was previously working full-time but quit on Monday which is when she stated that her symptoms began. Pt lives with her mother and daughter in a single level house with two steps to enter. Pt ambulated in hallway without use of an AD with supervision for safety. Pt did not demonstrate any deficits or gait abnormalities. Pt also successfully completed stair training with no concerns. 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 No further acute PT needs identified at this time. PT signing off.    Follow Up Recommendations No PT follow up    Equipment Recommendations  None recommended by PT    Recommendations for Other Services       Precautions / Restrictions Precautions Precautions: None Restrictions Weight Bearing Restrictions: No      Mobility  Bed Mobility Overal bed mobility: Independent                Transfers Overall transfer level: Independent                  Ambulation/Gait Ambulation/Gait assistance: Supervision Ambulation Distance (Feet): 300 Feet Assistive device: None Gait Pattern/deviations: Step-through pattern;WFL(Within Functional Limits) Gait velocity: WFL Gait velocity interpretation: at or above normal speed for age/gender General Gait Details: no instability or LOB, supervision for safety  Stairs Stairs: Yes Stairs  assistance: Modified independent (Device/Increase time) Stair Management: No rails;Alternating pattern;Forwards Number of Stairs: 2    Wheelchair Mobility    Modified Rankin (Stroke Patients Only) Modified Rankin (Stroke Patients Only) Pre-Morbid Rankin Score: No symptoms Modified Rankin: Slight disability     Balance Overall balance assessment: Independent                               Standardized Balance Assessment Standardized Balance Assessment : Dynamic Gait Index   Dynamic Gait Index Level Surface: Normal Change in Gait Speed: Normal Gait with Horizontal Head Turns: Normal Gait with Vertical Head Turns: Normal Gait and Pivot Turn: Normal Step Over Obstacle: Normal Step Around Obstacles: Normal Steps: Normal Total Score: 24       Pertinent Vitals/Pain Pain Assessment: No/denies pain    Home Living Family/patient expects to be discharged to:: Private residence Living Arrangements: Parent;Children Available Help at Discharge: Family;Available PRN/intermittently Type of Home: House Home Access: Stairs to enter Entrance Stairs-Rails: None Entrance Stairs-Number of Steps: 2 Home Layout: One level Home Equipment: None      Prior Function Level of Independence: Independent               Hand Dominance   Dominant Hand: Left    Extremity/Trunk Assessment   Upper Extremity Assessment Upper Extremity Assessment: Defer to OT evaluation    Lower Extremity Assessment Lower Extremity Assessment: Overall WFL for tasks assessed    Cervical / Trunk Assessment Cervical / Trunk Assessment: Normal  Communication   Communication: No  difficulties  Cognition Arousal/Alertness: Awake/alert Behavior During Therapy: WFL for tasks assessed/performed Overall Cognitive Status: Within Functional Limits for tasks assessed                                        General Comments      Exercises     Assessment/Plan    PT  Assessment Patent does not need any further PT services  PT Problem List         PT Treatment Interventions      PT Goals (Current goals can be found in the Care Plan section)  Acute Rehab PT Goals Patient Stated Goal: regain feeling in L hand    Frequency     Barriers to discharge        Co-evaluation               AM-PAC PT "6 Clicks" Daily Activity  Outcome Measure Difficulty turning over in bed (including adjusting bedclothes, sheets and blankets)?: None Difficulty moving from lying on back to sitting on the side of the bed? : None Difficulty sitting down on and standing up from a chair with arms (e.g., wheelchair, bedside commode, etc,.)?: None Help needed moving to and from a bed to chair (including a wheelchair)?: None Help needed walking in hospital room?: None Help needed climbing 3-5 steps with a railing? : None 6 Click Score: 24    End of Session Equipment Utilized During Treatment: Gait belt Activity Tolerance: Patient tolerated treatment well Patient left: in bed;with call bell/phone within reach Nurse Communication: Mobility status PT Visit Diagnosis: Other symptoms and signs involving the nervous system (R29.898)    Time: [id number] PT Time Calculation (min) (ACUTE ONLY): 15 min   Charges:   PT Evaluation $PT Eval Low Complexity: 1 Low     PT G Codes:   PT G-Codes **NOT FOR INPATIENT CLASS** Functional Assessment Tool Used: AM-PAC 6 Clicks Basic Mobility;Clinical judgement Functional Limitation: Mobility: Walking and moving around Mobility: Walking and Moving Around Current Status (W1191(G8978): 0 percent impaired, limited or restricted Mobility: Walking and Moving Around Goal Status (Y7829(G8979): 0 percent impaired, limited or restricted Mobility: Walking and Moving Around Discharge Status (F6213(G8980): 0 percent impaired, limited or restricted    [facility] NorthwestJennifer Tywanna Seifer, PT, DPT [phone]   Alessandra BevelsJennifer M Ashlea Dusing 08/17/2017, 2:45 PM

## 2017-08-17 NOTE — Evaluation (Signed)
Occupational Therapy Evaluation Patient Details Name: Krista Maddox MRN: [id number] DOB: 30-Dec-1985 Today's Date: 08/17/2017    History of Present Illness Pt is a 31 y/o female admitted secondary to L sided numbness. 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 PMH including but not limited to migraines.   Clinical Impression   PTA, pt was independent with ADL and functional mobility and in school to become a Engineer, site. She currently presents with decreased L UE coordination, decreased sensation for light touch and proprioception in L forearm and hand as well as L UE weakness (greatest weakness in grasp) impacting her ability to participate in ADL at PLOF. She currently requires min assist for fasteners during dressing tasks and supervision for grooming and self-feeding tasks. Pt is L handed and demonstrates great difficulty with fine motor tasks related to school performance. Educated pt concerning use of red tubing for built-up handles for eating utensils and toothbrush and she returns demonstration. She would benefit from continued OT services to maximize return to independent PLOF. OT will continue to follow while admitted.     Follow Up Recommendations  Outpatient OT;Supervision - Intermittent    Equipment Recommendations  None recommended by OT    Recommendations for Other Services       Precautions / Restrictions Precautions Precautions: None Restrictions Weight Bearing Restrictions: No      Mobility Bed Mobility Overal bed mobility: Independent                Transfers Overall transfer level: Independent                    Balance Overall balance assessment: Independent                               Standardized Balance Assessment Standardized Balance Assessment : Dynamic Gait Index   Dynamic Gait Index Level Surface: Normal Change in Gait Speed: Normal Gait with Horizontal Head  Turns: Normal Gait with Vertical Head Turns: Normal Gait and Pivot Turn: Normal Step Over Obstacle: Normal Step Around Obstacles: Normal Steps: Normal Total Score: 24     ADL either performed or assessed with clinical judgement   ADL Overall ADL's : Needs assistance/impaired Eating/Feeding: Supervision/ safety;Sitting   Grooming: Supervision/safety;Standing   Upper Body Bathing: Sitting;Supervision/ safety   Lower Body Bathing: Sit to/from stand;Supervison/ safety   Upper Body Dressing : Minimal assistance;Sitting Upper Body Dressing Details (indicate cue type and reason): Min assist for fasteners Lower Body Dressing: Minimal assistance;Sit to/from stand Lower Body Dressing Details (indicate cue type and reason): Min assist for fasteners Toilet Transfer: Independent   Toileting- Clothing Manipulation and Hygiene: Independent       Functional mobility during ADLs: Independent General ADL Comments: Pt with greatest difficulty completing fine motor manipulation tasks. L UE fatigues easily. Provided red tubing for eating utensils and toothbrush and pt able to demonstrate use. 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      Vision Baseline Vision/History: Wears glasses Wears Glasses: At all times Patient Visual Report: No change from baseline Vision Assessment?: No apparent visual deficits Additional Comments: Able to functionally utilize vision for reading text on her phone and identifying objects.      Perception     Praxis      Pertinent Vitals/Pain Pain Assessment: 0-10 Pain Score: 5  Pain  Location: headache (frontal) Pain Descriptors / Indicators: Headache Pain Intervention(s): Limited activity within patient's tolerance;Monitored during session;Repositioned     Hand Dominance Left   Extremity/Trunk Assessment Upper Extremity Assessment Upper Extremity Assessment: LUE deficits/detail LUE  Deficits / Details: Decreased proprioception and light touch in L hand and forearm. Upper arm sensation WFL. Grossly weak compared with R with greatest area of weakness grasp strength (3/5). Decreased coordination noted with finger-to-nose and poor ability to complete rapidly alternating movements.  LUE Sensation: decreased proprioception;decreased light touch LUE Coordination: decreased fine motor;decreased gross motor   Lower Extremity Assessment Lower Extremity Assessment: Overall WFL for tasks assessed   Cervical / Trunk Assessment Cervical / Trunk Assessment: Normal   Communication Communication Communication: No difficulties   Cognition Arousal/Alertness: Awake/alert Behavior During Therapy: WFL for tasks assessed/performed Overall Cognitive Status: Within Functional Limits for tasks assessed                                 General Comments: Good reasoning, insight, and awareness during functional tasks.    General Comments       Exercises     Shoulder Instructions      Home Living Family/patient expects to be discharged to:: Private residence Living Arrangements: Parent;Children Available Help at Discharge: Family;Available PRN/intermittently Type of Home: House Home Access: Stairs to enter Entergy CorporationEntrance Stairs-Number of Steps: 2 Entrance Stairs-Rails: None Home Layout: One level     Bathroom Shower/Tub: Tub/shower unit         Home Equipment: None          Prior Functioning/Environment Level of Independence: Independent                 OT Problem List: Decreased strength;Decreased coordination;Impaired UE functional use      OT Treatment/Interventions: Self-care/ADL training;Therapeutic exercise;Therapeutic activities;Patient/family education    OT Goals(Current goals can be found in the care plan section) Acute Rehab OT Goals Patient Stated Goal: regain feeling in L hand OT Goal Formulation: With patient Time For Goal Achievement:  08/24/17 Potential to Achieve Goals: Good  OT Frequency: Min 2X/week   Barriers to D/C:            Co-evaluation              AM-PAC PT "6 Clicks" Daily Activity     Outcome Measure Help from another person eating meals?: A Little Help from another person taking care of personal grooming?: A Little Help from another person toileting, which includes using toliet, bedpan, or urinal?: None Help from another person bathing (including washing, rinsing, drying)?: None Help from another person to put on and taking off regular upper body clothing?: A Little Help from another person to put on and taking off regular lower body clothing?: A Little 6 Click Score: 20   End of Session Nurse Communication: Mobility status (RN present during session for handoff and pt requesting pain medication while RN in room. )  Activity Tolerance: Patient tolerated treatment well Patient left: in bed;with call bell/phone within reach (sitting at EOB; signed off as independent per nursing staff)  OT Visit Diagnosis: Ataxia, unspecified (R27.0);Hemiplegia and hemiparesis Hemiplegia - Right/Left: Left Hemiplegia - dominant/non-dominant: Dominant Hemiplegia - caused by: Cerebral infarction                Time: 1191-47821516-1533 OT Time Calculation (min): 17 min Charges:  OT General Charges $OT Visit: 1 Visit OT Evaluation $OT Eval Moderate  Complexity: 1 Mod G-Codes: OT G-codes **NOT FOR INPATIENT CLASS** Functional Assessment Tool Used: Clinical judgement Functional Limitation: Self care Self Care Current Status (N8295): At least 1 percent but less than 20 percent impaired, limited or restricted Self Care Goal Status (A2130): At least 1 percent but less than 20 percent impaired, limited or restricted   Doristine Section, MS OTR/L  Pager: [phone]   Madysin Crisp A Lambert Jeanty 08/17/2017, 3:44 PM

## 2017-08-17 NOTE — Progress Notes (Signed)
Patient arrived around 0130 alert and oriented X 4, slight left facial asymmetry, some slight numbness in left hand and intermittent dizzy spells, no head ache or N/V. Telly applied, VTE is heparin initial assement complete awaiting neuro MD's assesment. Will continue with Q2 N/V checks.

## 2017-08-17 NOTE — Evaluation (Signed)
Speech Language Pathology Evaluation Patient Details Name: Krista FickHeather L Maddox MRN: [id number] DOB: [birth year] Today's Date: 08/17/2017 Time: 1550-1606 SLP Time Calculation (min) (ACUTE ONLY): 16 min  Problem List:  Patient Active Problem List   Diagnosis Date Noted  . 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 Numbness on left side 08/16/2017  . Acute left lumbar radiculopathy 10/13/2016  . Chronic migraine 06/01/2016  . Pseudotumor cerebri 11/03/2015  . DENTAL PAIN 06/09/2010  . HEADACHE, TENSION 02/04/2010  . SCABIES 01/13/2010  . FOOT PAIN, LEFT 01/13/2010  . CERUMEN IMPACTION, BILATERAL 11/24/2009  . PSORIASIS 11/24/2009  . TOBACCO ABUSE 10/15/2009  . 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 PYELONEPHRITIS 10/01/2009  . THROMBOTIC THROMBOCYTOPENIC PURPURA 12/13/2005   Past Medical History:  Past Medical History:  Diagnosis Date  . DDD (degenerative disc disease), lumbar   . Headache   . Other disorders of optic nerve, not elsewhere classified, unspecified eye   . TTP (thrombotic thrombocytopenic purpura) (HCC)    Past Surgical History:  Past Surgical History:  Procedure Laterality Date  . CESAREAN SECTION    . CHOLECYSTECTOMY    . WISDOM TOOTH EXTRACTION     HPI:  31 y/o female admitted secondary to L sided numbness. 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 PMH migraines.   Assessment / Plan / Recommendation Clinical Impression  Pt presents with normal expression/comprehension; speech is clear and fluent.  She does present with decreased divergent thinking, mild deficits in working memory, and difficulty with Furniture conservator/restorerspatial construction during a clock-drawing task.  Pt would benefit from more in-depth cognitive assessment after D/C, particularly given her youth, responsibilities, and current status as a full-time Archivistcollege student.     SLP Assessment  SLP Recommendation/Assessment: All further Speech Lanaguage  Pathology  needs can be addressed in the next venue of care SLP Visit Diagnosis: Cognitive communication deficit (R41.841)    Follow Up Recommendations    SLP for higher level cognition   Frequency and Duration           SLP Evaluation Cognition  Overall Cognitive Status: Impaired/Different from baseline Arousal/Alertness: Awake/alert Orientation Level: Oriented X4 Attention: Selective Selective Attention: Appears intact Memory: Impaired Memory Impairment: Retrieval deficit Awareness: Appears intact Problem Solving: Impaired Problem Solving Impairment: Verbal complex Safety/Judgment: Appears intact       Comprehension  Auditory Comprehension Overall Auditory Comprehension: Appears within functional limits for tasks assessed Visual Recognition/Discrimination Discrimination: Within Function Limits Reading Comprehension Reading Status: Within funtional limits    Expression Expression Primary Mode of Expression: Verbal Verbal Expression Overall Verbal Expression: Appears within functional limits for tasks assessed Written Expression Dominant Hand: Left   Oral / Motor  Oral Motor/Sensory Function Overall Oral Motor/Sensory Function: Other (comment) (decreased sensation left face) Motor Speech Overall Motor Speech: Appears within functional limits for tasks assessed   GO          Functional Assessment Tool Used: clinical judgment Functional Limitations: Memory Memory Current Status (J4782(G9168): At least 1 percent but less than 20 percent impaired, limited or restricted Memory Goal Status (N5621(G9169): At least 1 percent but less than 20 percent impaired, limited or restricted Memory Discharge Status [phone](G9170): At least 1 percent but less than 20 percent impaired, limited or restricted         Blenda MountsCouture, Averi Kilty Laurice 08/17/2017, 4:19 PM

## 2017-08-18 ENCOUNTER — Inpatient Hospital Stay (HOSPITAL_COMMUNITY): Payer: Self-pay

## 2017-08-18 DIAGNOSIS — R748 Abnormal levels of other serum enzymes: Secondary | ICD-10-CM

## 2017-08-18 LAB — ECHOCARDIOGRAM COMPLETE
EERAT: 6.63
EWDT: 176 ms
FS: 38 % (ref 28–44)
Height: 65 in
IV/PV OW: 0.91
LA ID, A-P, ES: 34 mm
LA diam end sys: 34 mm
LA vol index: 26 mL/m2
LADIAMINDEX: 1.72 cm/m2
LAVOL: 51.5 mL
LAVOLA4C: 45 mL
LV E/e' medial: 6.63
LV e' LATERAL: 16.6 cm/s
LVEEAVG: 6.63
LVOT SV: 56 mL
LVOT VTI: 22 cm
LVOT area: 2.54 cm2
LVOT peak grad rest: 5 mmHg
LVOT peak vel: 112 cm/s
LVOTD: 18 mm
Lateral S' vel: 10.9 cm/s
MV Dec: 176
MV Peak grad: 5 mmHg
MVPKAVEL: 46.1 m/s
MVPKEVEL: 110 m/s
PW: 8.86 mm — AB (ref 0.6–1.1)
TAPSE: 24 mm
TDI e' lateral: 16.6
TDI e' medial: 11.5
WEIGHTICAEL: 3200 [oz_av]

## 2017-08-18 LAB — LIPID PANEL
CHOL/HDL RATIO: 4.9 ratio
CHOLESTEROL: 172 mg/dL (ref 0–200)
HDL: 35 mg/dL — ABNORMAL LOW (ref >40)
LDL Cholesterol: 89 mg/dL (ref 0–99)
Triglycerides: 239 mg/dL — ABNORMAL HIGH (ref <150)
VLDL: 48 mg/dL — AB (ref 0–40)

## 2017-08-18 LAB — RAPID URINE DRUG SCREEN, HOSP PERFORMED
AMPHETAMINES: NOT DETECTED
BARBITURATES: NOT DETECTED
BENZODIAZEPINES: NOT DETECTED
COCAINE: NOT DETECTED
Opiates: NOT DETECTED
Tetrahydrocannabinol: NOT DETECTED

## 2017-08-18 LAB — SEDIMENTATION RATE: Sed Rate: 28 mm/hr — ABNORMAL HIGH (ref 0–22)

## 2017-08-18 LAB — HEMOGLOBIN A1C
Hgb A1c MFr Bld: 4.8 % (ref 4.8–5.6)
Mean Plasma Glucose: 91.06 mg/dL

## 2017-08-18 MED ORDER — ATORVASTATIN CALCIUM 10 MG PO TABS
10.0000 mg | ORAL_TABLET | Freq: Every day | ORAL | Status: DC
  Administered 2017-08-18 – 2017-08-19 (×2): 10 mg via ORAL
  Filled 2017-08-18 (×2): qty 1

## 2017-08-18 MED ORDER — ASPIRIN 81 MG PO CHEW
81.0000 mg | CHEWABLE_TABLET | Freq: Every day | ORAL | Status: DC
  Administered 2017-08-18 – 2017-08-20 (×3): 81 mg via ORAL
  Filled 2017-08-18 (×3): qty 1

## 2017-08-18 NOTE — Progress Notes (Addendum)
PROGRESS NOTE    Krista FickHeather L Marter  ZOX:[id number] DOB: Jul 26, 1986 DOA: 08/16/2017 PCP: Patient, No Pcp Per   Outpatient Specialists: Terrace ArabiaYAN    Brief Narrative:  Krista Maddox is a 31 y.o. female with medical history significant of TTP (while pregnant), IIH, migraines.  [facility] with c/o new onset L sided numbness.  Found to have multiple strokes on MRI. Denies using injection drugs.  FAmily Hx of CVAs in great  Grandparents but no blood issues known.  Assessment & Plan:   Principal Problem:   Numbness on left side Active Problems:   Pseudotumor cerebri   Chronic migraine   Cerebral embolism with cerebral infarction   Multiple embolic appearing CVAs -MRI + -echo pending -CTA done: patent arteries -FLP: LDL >70 -HgbA1C: 4.8 -neurology to see  incidental cervical lymphadenopathy outpatient follow up -denies recent sore throat/URI  Mildly elevated troponin -flat -will probably need TEE and cardiology evaluation  H/o psuedotumor cerebri -follow with Dr. Terrace ArabiaYan   DVT prophylaxis:  SQ Heparin  Code Status: Full Code   Family Communication:   Disposition Plan:     Consultants:   neuro    Subjective: Still having coordination issues with left hand  Objective: Vitals:   08/17/17 1630 08/17/17 2052 08/18/17 0116 08/18/17 0445  BP: 130/80 126/67 109/68 113/65  Pulse: 80 88 73 73  Resp: 18 18 18 20   Temp: 98.4 F (36.9 C) 98 F (36.7 C) 97.7 F (36.5 C) 98.1 F (36.7 C)  TempSrc: Axillary Oral Oral Oral  SpO2: 99% 97% 95% 97%  Weight:      Height:        Intake/Output Summary (Last 24 hours) at 08/18/17 1135 Last data filed at 08/18/17 0446  Gross per 24 hour  Intake              290 ml  Output                0 ml  Net              290 ml   Filed Weights   08/16/17 1742  Weight: 90.7 kg (200 lb)    Examination:  General exam: Appears calm and comfortable  Respiratory system: Clear to auscultation.  Respiratory effort normal. Cardiovascular system: S1 & S2 heard, RRR. No JVD, murmurs, rubs, gallops or clicks. No pedal edema. Gastrointestinal system: Abdomen is nondistended, soft and nontender. No organomegaly or masses felt. Normal bowel sounds heard. Skin: No rashes, lesions or ulcers Psychiatry: Judgement and insight appear normal. Mood & affect appropriate.     Data Reviewed: I have personally reviewed following labs and imaging studies  CBC:  Recent Labs Lab 08/16/17 1810  WBC 9.3  NEUTROABS 5.6  HGB 13.0  HCT 39.5  MCV 93.4  PLT 218   Basic Metabolic Panel:  Recent Labs Lab 08/16/17 1810  NA 138  K 3.7  CL 109  CO2 24  GLUCOSE 95  BUN 13  CREATININE 0.79  CALCIUM 8.7*   GFR: Estimated Creatinine Clearance: 113.4 mL/min (by C-G formula based on SCr of 0.79 mg/dL). Liver Function Tests:  Recent Labs Lab 08/16/17 1810  AST 15  ALT 7*  ALKPHOS 54  BILITOT 0.3  PROT 7.1  ALBUMIN 3.4*   No results for input(s): LIPASE, AMYLASE in the last 168 hours. No results for input(s): AMMONIA in the last 168 hours. Coagulation Profile:  Recent Labs Lab 08/16/17 1810  INR 0.88  Cardiac Enzymes:  Recent Labs Lab 08/16/17 1810 08/16/17 2240  TROPONINI 0.12* 0.11*   BNP (last 3 results) No results for input(s): PROBNP in the last 8760 hours. HbA1C:  Recent Labs  08/18/17 0546  HGBA1C 4.8   CBG:  Recent Labs Lab 08/16/17 1841  GLUCAP 92   Lipid Profile:  Recent Labs  08/18/17 0546  CHOL 172  HDL 35*  LDLCALC 89  TRIG 409*  CHOLHDL 4.9   Thyroid Function Tests: No results for input(s): TSH, T4TOTAL, FREET4, T3FREE, THYROIDAB in the last 72 hours. Anemia Panel: No results for input(s): VITAMINB12, FOLATE, FERRITIN, TIBC, IRON, RETICCTPCT in the last 72 hours. Urine analysis:    Component Value Date/Time   COLORURINE YELLOW 08/16/2017 1815   APPEARANCEUR CLOUDY (A) 08/16/2017 1815   LABSPEC 1.020 08/16/2017 1815   PHURINE 7.0  08/16/2017 1815   GLUCOSEU NEGATIVE 08/16/2017 1815   HGBUR NEGATIVE 08/16/2017 1815   HGBUR negative 11/24/2009 0847   BILIRUBINUR NEGATIVE 08/16/2017 1815   KETONESUR NEGATIVE 08/16/2017 1815   PROTEINUR NEGATIVE 08/16/2017 1815   UROBILINOGEN 0.2 03/13/2012 0023   NITRITE NEGATIVE 08/16/2017 1815   LEUKOCYTESUR NEGATIVE 08/16/2017 1815     )No results found for this or any previous visit (from the past 240 hour(s)).    Anti-infectives    None       Radiology Studies: Ct Angio Head W Or Wo Contrast  Result Date: 08/17/2017 CLINICAL DATA:  31 y/o F; dizziness and left-sided numbness. History of TTP. EXAM: CT ANGIOGRAPHY HEAD AND NECK TECHNIQUE: Multidetector CT imaging of the head and neck was performed using the standard protocol during bolus administration of intravenous contrast. Multiplanar CT image reconstructions and MIPs were obtained to evaluate the vascular anatomy. Carotid stenosis measurements (when applicable) are obtained utilizing NASCET criteria, using the distal internal carotid diameter as the denominator. CONTRAST:  50 cc Isovue 370 COMPARISON:  08/16/2017 CT head FINDINGS: CTA NECK FINDINGS Aortic arch: Bovine variant branching. Imaged portion shows no evidence of aneurysm or dissection. No significant stenosis of the major arch vessel origins. Right carotid system: No evidence of dissection, stenosis (50% or greater) or occlusion. Left carotid system: No evidence of dissection, stenosis (50% or greater) or occlusion. Vertebral arteries: Left dominant. No evidence of dissection, stenosis (50% or greater) or occlusion. Skeleton: Negative. Other neck: Bilateral level II adenopathy measuring up to 12 x 18 mm on the left (series 5: Image 99). Upper chest: Negative. Review of the MIP images confirms the above findings CTA HEAD FINDINGS Anterior circulation: No significant stenosis, proximal occlusion, aneurysm, or vascular malformation. Posterior circulation: No significant  stenosis, proximal occlusion, aneurysm, or vascular malformation. Venous sinuses: As permitted by contrast timing, patent. Anatomic variants: No anterior communicating artery. Large bilateral posterior communicating arteries. Delayed phase: No abnormal intracranial enhancement. Review of the MIP images confirms the above findings IMPRESSION: 1. Patent carotid and vertebral arteries. No dissection, aneurysm, or hemodynamically significant stenosis utilizing NASCET criteria. 2. Patent circle of Willis. No large vessel occlusion, aneurysm, or significant stenosis. 3. No abnormal enhancement of the brain. 4. Bilateral upper anterior cervical lymphadenopathy is nonspecific, possibly reactive to underlying pharyngitis or upper respiratory infection. Differential includes systemic infectious/inflammatory processes and lymphoproliferative disease. Electronically Signed   By: Mitzi Hansen M.D.   On: 08/17/2017 06:50   Ct Head Wo Contrast  Result Date: 08/16/2017 CLINICAL DATA:  Headache and LEFT-sided numbness yesterday, intermittent similar symptoms today. History of TTP. EXAM: CT HEAD WITHOUT CONTRAST TECHNIQUE: Contiguous axial images  were obtained from the base of the skull through the vertex without intravenous contrast. COMPARISON:  MRI of the head June 11, 2015 FINDINGS: BRAIN: No intraparenchymal hemorrhage, mass effect nor midline shift. The ventricles and sulci are normal. No acute large vascular territory infarcts. No abnormal extra-axial fluid collections. Basal cisterns are patent. VASCULAR: Unremarkable. SKULL/SOFT TISSUES: No skull fracture. No significant soft tissue swelling. ORBITS/SINUSES: The included ocular globes and orbital contents are normal.The mastoid aircells and included paranasal sinuses are well-aerated. OTHER: None. IMPRESSION: Normal noncontrast CT HEAD. Electronically Signed   By: Awilda Metro M.D.   On: 08/16/2017 18:49   Ct Angio Neck W Or Wo Contrast  Result Date:  08/17/2017 CLINICAL DATA:  31 y/o F; dizziness and left-sided numbness. History of TTP. EXAM: CT ANGIOGRAPHY HEAD AND NECK TECHNIQUE: Multidetector CT imaging of the head and neck was performed using the standard protocol during bolus administration of intravenous contrast. Multiplanar CT image reconstructions and MIPs were obtained to evaluate the vascular anatomy. Carotid stenosis measurements (when applicable) are obtained utilizing NASCET criteria, using the distal internal carotid diameter as the denominator. CONTRAST:  50 cc Isovue 370 COMPARISON:  08/16/2017 CT head FINDINGS: CTA NECK FINDINGS Aortic arch: Bovine variant branching. Imaged portion shows no evidence of aneurysm or dissection. No significant stenosis of the major arch vessel origins. Right carotid system: No evidence of dissection, stenosis (50% or greater) or occlusion. Left carotid system: No evidence of dissection, stenosis (50% or greater) or occlusion. Vertebral arteries: Left dominant. No evidence of dissection, stenosis (50% or greater) or occlusion. Skeleton: Negative. Other neck: Bilateral level II adenopathy measuring up to 12 x 18 mm on the left (series 5: Image 99). Upper chest: Negative. Review of the MIP images confirms the above findings CTA HEAD FINDINGS Anterior circulation: No significant stenosis, proximal occlusion, aneurysm, or vascular malformation. Posterior circulation: No significant stenosis, proximal occlusion, aneurysm, or vascular malformation. Venous sinuses: As permitted by contrast timing, patent. Anatomic variants: No anterior communicating artery. Large bilateral posterior communicating arteries. Delayed phase: No abnormal intracranial enhancement. Review of the MIP images confirms the above findings IMPRESSION: 1. Patent carotid and vertebral arteries. No dissection, aneurysm, or hemodynamically significant stenosis utilizing NASCET criteria. 2. Patent circle of Willis. No large vessel occlusion, aneurysm, or  significant stenosis. 3. No abnormal enhancement of the brain. 4. Bilateral upper anterior cervical lymphadenopathy is nonspecific, possibly reactive to underlying pharyngitis or upper respiratory infection. Differential includes systemic infectious/inflammatory processes and lymphoproliferative disease. Electronically Signed   By: Mitzi Hansen M.D.   On: 08/17/2017 06:50   Mr Laqueta Jean WU Contrast  Result Date: 08/17/2017 CLINICAL DATA:  History of pseudotumor. Left-sided weakness beginning several days ago. Sudden onset of dizziness. History of TTP. EXAM: MRI HEAD WITHOUT AND WITH CONTRAST TECHNIQUE: Multiplanar, multiecho pulse sequences of the brain and surrounding structures were obtained without and with intravenous contrast. CONTRAST:  15mL MULTIHANCE GADOBENATE DIMEGLUMINE 529 MG/ML IV SOLN COMPARISON:  CT studies same day.  MRI 06/11/2015. FINDINGS: Brain: There are scattered acute embolic infarctions within multiple territories consistent with micro emboli from the heart or ascending aorta. There are a few small subcentimeter acute infarctions in the right cerebellum. Left cerebellum is normal. Brainstem is normal. Left cerebral hemisphere is normal. There are scattered acute infarctions within the right posterior frontal region consistent with embolic infarctions within right MCA branch vessels. No large confluent infarction. No mass effect or hemorrhage. No shift. No evidence of old insults. No hydrocephalus. No extra-axial collection. No  abnormal contrast enhancement. Vascular: Major vessels at the base of the brain show flow. Skull and upper cervical spine: Negative Sinuses/Orbits: Clear/normal Other: None IMPRESSION: Small acute infarctions in the right cerebellum. Several small acute infarctions in the right posterior frontal region. No large confluent infarction, hemorrhage or mass effect. Small acute infarctions in multiple vascular territories suggests embolic disease from the heart  or ascending aorta. Electronically Signed   By: Paulina Fusi M.D.   On: 08/17/2017 12:12        Scheduled Meds: . atorvastatin  10 mg Oral q1800  . gabapentin  300 mg Oral TID  . heparin  5,000 Units Subcutaneous Q8H  . magnesium oxide  400 mg Oral BID  . meloxicam  15 mg Oral Q breakfast  . nortriptyline  50 mg Oral QHS  . topiramate  150 mg Oral BID   Continuous Infusions:   LOS: 1 day    Time spent: 35 min    Francheska Villeda U Harlo Fabela, DO [facility] Pager [phone]  If 7PM-7AM, please contact night-coverage [email or web address] Password [email or web address] 08/18/2017, 11:35 AM

## 2017-08-18 NOTE — Progress Notes (Signed)
STROKE TEAM PROGRESS NOTE   HISTORY OF PRESENT ILLNESS (per record) Left-sided weakness  History is obtained from: Patient  HPI: Krista Maddox is a 31 y.o. female with a history of TTP, pseudotumor cerebri who presents with left-sided weakness that started abruptly days ago. States that she had a sudden onset of dizziness in the left side when abruptly numb.     LKW: Monday tpa given?: no, outside of window  == MRI  Reviewed. Embolic appearing strokes in right cerebral and right cerebellar hemisphere.  Rec: HgbA1c, fasting lipid panel Frequent neuro checks Echocardiogram Prophylactic therapy-Antiplatelet med: Aspirin - dose 325mg  PO or 300mg  PR Risk factor modification Telemetry monitoring PT consult, OT consult, Speech consult Will benefit from hypercoagulable workup as deeemed appropriate by stroke team.  Patient was not administered IV t-PA secondary to  Delay in arrival She was admitted to the neuro floor for further evaluation and treatment.   SUBJECTIVE (INTERVAL HISTORY) Her father is at the bedside.  The patient is awake, alert, and follows all commands appropriately.  The pt states that she has had 2 - 3 episodes of severe dizziness and nausea while driving, where she has to pull over and let someone else drive.  She states these episodes happened over the course of about one month.  Pt is uninsured, and has not been able to follow up with her neurologist for her idiopathic intracranial hypertension.  Pt is taking oral contraceptives.  Pt is a current smoker.     OBJECTIVE Temp:  [97.7 F (36.5 C)-98.4 F (36.9 C)] 98.1 F (36.7 C) (09/06 0445) Pulse Rate:  [72-88] 73 (09/06 0445) Cardiac Rhythm: Normal sinus rhythm (09/06 0750) Resp:  [16-20] 20 (09/06 0445) BP: (109-130)/(65-80) 113/65 (09/06 0445) SpO2:  [95 %-99 %] 97 % (09/06 0445)  CBC:  Recent Labs Lab 08/16/17 1810  WBC 9.3  NEUTROABS 5.6  HGB 13.0  HCT 39.5  MCV 93.4  PLT 218     Basic Metabolic Panel:  Recent Labs Lab 08/16/17 1810  NA 138  K 3.7  CL 109  CO2 24  GLUCOSE 95  BUN 13  CREATININE 0.79  CALCIUM 8.7*    Lipid Panel:    Component Value Date/Time   CHOL 172 08/18/2017 0546   TRIG 239 (H) 08/18/2017 0546   HDL 35 (L) 08/18/2017 0546   CHOLHDL 4.9 08/18/2017 0546   VLDL 48 (H) 08/18/2017 0546   LDLCALC 89 08/18/2017 0546   HgbA1c:  Lab Results  Component Value Date   HGBA1C 4.8 08/18/2017   Urine Drug Screen: No results found for: LABOPIA, COCAINSCRNUR, LABBENZ, AMPHETMU, THCU, LABBARB  Alcohol Level No results found for: ETH  IMAGING  Ct Angio Head W Or Wo Contrast  Result Date: 08/17/2017 CLINICAL DATA:  31 y/o F; dizziness and left-sided numbness. History of TTP. EXAM: CT ANGIOGRAPHY HEAD AND NECK TECHNIQUE: Multidetector CT imaging of the head and neck was performed using the standard protocol during bolus administration of intravenous contrast. Multiplanar CT image reconstructions and MIPs were obtained to evaluate the vascular anatomy. Carotid stenosis measurements (when applicable) are obtained utilizing NASCET criteria, using the distal internal carotid diameter as the denominator. CONTRAST:  50 cc Isovue 370 COMPARISON:  08/16/2017 CT head FINDINGS: CTA NECK FINDINGS Aortic arch: Bovine variant branching. Imaged portion shows no evidence of aneurysm or dissection. No significant stenosis of the major arch vessel origins. Right carotid system: No evidence of dissection, stenosis (50% or greater) or occlusion. Left carotid system:  No evidence of dissection, stenosis (50% or greater) or occlusion. Vertebral arteries: Left dominant. No evidence of dissection, stenosis (50% or greater) or occlusion. Skeleton: Negative. Other neck: Bilateral level II adenopathy measuring up to 12 x 18 mm on the left (series 5: Image 99). Upper chest: Negative. Review of the MIP images confirms the above findings CTA HEAD FINDINGS Anterior circulation: No  significant stenosis, proximal occlusion, aneurysm, or vascular malformation. Posterior circulation: No significant stenosis, proximal occlusion, aneurysm, or vascular malformation. Venous sinuses: As permitted by contrast timing, patent. Anatomic variants: No anterior communicating artery. Large bilateral posterior communicating arteries. Delayed phase: No abnormal intracranial enhancement. Review of the MIP images confirms the above findings IMPRESSION: 1. Patent carotid and vertebral arteries. No dissection, aneurysm, or hemodynamically significant stenosis utilizing NASCET criteria. 2. Patent circle of Willis. No large vessel occlusion, aneurysm, or significant stenosis. 3. No abnormal enhancement of the brain. 4. Bilateral upper anterior cervical lymphadenopathy is nonspecific, possibly reactive to underlying pharyngitis or upper respiratory infection. Differential includes systemic infectious/inflammatory processes and lymphoproliferative disease. Electronically Signed   By: Mitzi Hansen M.D.   On: 08/17/2017 06:50   Ct Head Wo Contrast  Result Date: 08/16/2017 CLINICAL DATA:  Headache and LEFT-sided numbness yesterday, intermittent similar symptoms today. History of TTP. EXAM: CT HEAD WITHOUT CONTRAST TECHNIQUE: Contiguous axial images were obtained from the base of the skull through the vertex without intravenous contrast. COMPARISON:  MRI of the head June 11, 2015 FINDINGS: BRAIN: No intraparenchymal hemorrhage, mass effect nor midline shift. The ventricles and sulci are normal. No acute large vascular territory infarcts. No abnormal extra-axial fluid collections. Basal cisterns are patent. VASCULAR: Unremarkable. SKULL/SOFT TISSUES: No skull fracture. No significant soft tissue swelling. ORBITS/SINUSES: The included ocular globes and orbital contents are normal.The mastoid aircells and included paranasal sinuses are well-aerated. OTHER: None. IMPRESSION: Normal noncontrast CT HEAD.  Electronically Signed   By: Awilda Metro M.D.   On: 08/16/2017 18:49   Ct Angio Neck W Or Wo Contrast  Result Date: 08/17/2017 CLINICAL DATA:  31 y/o F; dizziness and left-sided numbness. History of TTP. EXAM: CT ANGIOGRAPHY HEAD AND NECK TECHNIQUE: Multidetector CT imaging of the head and neck was performed using the standard protocol during bolus administration of intravenous contrast. Multiplanar CT image reconstructions and MIPs were obtained to evaluate the vascular anatomy. Carotid stenosis measurements (when applicable) are obtained utilizing NASCET criteria, using the distal internal carotid diameter as the denominator. CONTRAST:  50 cc Isovue 370 COMPARISON:  08/16/2017 CT head FINDINGS: CTA NECK FINDINGS Aortic arch: Bovine variant branching. Imaged portion shows no evidence of aneurysm or dissection. No significant stenosis of the major arch vessel origins. Right carotid system: No evidence of dissection, stenosis (50% or greater) or occlusion. Left carotid system: No evidence of dissection, stenosis (50% or greater) or occlusion. Vertebral arteries: Left dominant. No evidence of dissection, stenosis (50% or greater) or occlusion. Skeleton: Negative. Other neck: Bilateral level II adenopathy measuring up to 12 x 18 mm on the left (series 5: Image 99). Upper chest: Negative. Review of the MIP images confirms the above findings CTA HEAD FINDINGS Anterior circulation: No significant stenosis, proximal occlusion, aneurysm, or vascular malformation. Posterior circulation: No significant stenosis, proximal occlusion, aneurysm, or vascular malformation. Venous sinuses: As permitted by contrast timing, patent. Anatomic variants: No anterior communicating artery. Large bilateral posterior communicating arteries. Delayed phase: No abnormal intracranial enhancement. Review of the MIP images confirms the above findings IMPRESSION: 1. Patent carotid and vertebral arteries. No dissection, aneurysm,  or  hemodynamically significant stenosis utilizing NASCET criteria. 2. Patent circle of Willis. No large vessel occlusion, aneurysm, or significant stenosis. 3. No abnormal enhancement of the brain. 4. Bilateral upper anterior cervical lymphadenopathy is nonspecific, possibly reactive to underlying pharyngitis or upper respiratory infection. Differential includes systemic infectious/inflammatory processes and lymphoproliferative disease. Electronically Signed   By: Mitzi Hansen M.D.   On: 08/17/2017 06:50   Mr Laqueta Jean AV Contrast  Result Date: 08/17/2017 CLINICAL DATA:  History of pseudotumor. Left-sided weakness beginning several days ago. Sudden onset of dizziness. History of TTP. EXAM: MRI HEAD WITHOUT AND WITH CONTRAST TECHNIQUE: Multiplanar, multiecho pulse sequences of the brain and surrounding structures were obtained without and with intravenous contrast. CONTRAST:  15mL MULTIHANCE GADOBENATE DIMEGLUMINE 529 MG/ML IV SOLN COMPARISON:  CT studies same day.  MRI 06/11/2015. FINDINGS: Brain: There are scattered acute embolic infarctions within multiple territories consistent with micro emboli from the heart or ascending aorta. There are a few small subcentimeter acute infarctions in the right cerebellum. Left cerebellum is normal. Brainstem is normal. Left cerebral hemisphere is normal. There are scattered acute infarctions within the right posterior frontal region consistent with embolic infarctions within right MCA branch vessels. No large confluent infarction. No mass effect or hemorrhage. No shift. No evidence of old insults. No hydrocephalus. No extra-axial collection. No abnormal contrast enhancement. Vascular: Major vessels at the base of the brain show flow. Skull and upper cervical spine: Negative Sinuses/Orbits: Clear/normal Other: None IMPRESSION: Small acute infarctions in the right cerebellum. Several small acute infarctions in the right posterior frontal region. No large confluent  infarction, hemorrhage or mass effect. Small acute infarctions in multiple vascular territories suggests embolic disease from the heart or ascending aorta. Electronically Signed   By: Paulina Fusi M.D.   On: 08/17/2017 12:12       PHYSICAL EXAM Young lady not in distress. . Afebrile. Head is nontraumatic. Neck is supple without bruit.    Cardiac exam no murmur or gallop. Lungs are clear to auscultation. Distal pulses are well felt. Neurological Exam :  Awake alert oriented x 3 normal speech and language. Mild left lower face asymmetry. Tongue midline. No drift. Mild diminished fine finger movements on left. Orbits right over left upper extremity. Mild left grip weak.. Normal sensation . Normal coordination. Gait not tested ASSESSMENT/PLAN Ms. ADDYSON TRAUB is a 31 y.o. 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    Rt MCA and cerebellar branch embolic infarcts.source undetermined. H/o birth control pills, smoking and migraines may be contributing  Resultant  Mild left hemiplegia  CT head normal  MRI head right frontal MCA branch and right cerebellar embolic infarcts  CTA neck and brain no large vessel occlusion or stenosis  Carotid Doppler  Not done 2D Echo  Left ventricle: The cavity size was normal. Systolic function was   normal. The estimated ejection fraction was in the range of 55%   to 60%. Wall motion was normal; there were no regional wall    motion abnormalities.  LDL 89  HgbA1c 4.8  SCDs for VTE prophylaxis  Diet Heart Room service appropriate? Yes; Fluid consistency: Thin  No antiplatelets prior to admission, now on aspirin   Patient counseled to be compliant with her antithrombotic medications  Ongoing aggressive stroke risk factor management  Therapy recommendations:  pending  Disposition:  pending  Hypertension   Stable  Permissive hypertension (OK if < 220/120) but gradually normalize in 5-7 days  Long-term BP goal normotensive  Hyperlipidemia  Home meds: none  LDL 89, goal < 70  Add statin  Continue statin at discharge  Diabetes  HgbA1c4.8 goal < 7.0   Controlled  Other Stroke Risk Factors  Cigarette smoker- Patient advised to stop smoking    Obesity, Body mass index is 33.28 kg/m., recommend weight loss, diet and exercise as appropriate     Migraines At risk for Obstructive sleep apnea,   Other Active Problems     Hospital day # 1  I have personally examined this patient, reviewed notes, independently viewed imaging studies, participated in medical decision making and plan of care.ROS completed by me personally and pertinent positives fully documented  I have made any additions or clarifications directly to the above note.  She presents with left-sided weakness due to embolic right MCA branch and cerebral infarcts.She has no risk factors except birth control pills, smoking, migraine and mild obesity. Recommend stop birth control pills and smoking. Start aspirin. Check labs for vasculitis, hypercoagulability given young age and TEE for PFO/cardiac source of embolism.Greater than 50%time during this 35 minute visit was spent on counselling and coordination of care about her embolic strokes, discussion about evaluation and treatment plan and answering questions. Delia Heady, MD Medical Director [facility] Pager: [phone] 08/18/2017 9:27 PM   To contact Stroke Continuity provider, please refer to [email or web address]. After hours, contact General Neurology

## 2017-08-18 NOTE — Progress Notes (Signed)
  Echocardiogram 2D Echocardiogram has been performed.  Krista Maddox, Krista Maddox 08/18/2017, 12:52 PM

## 2017-08-18 NOTE — Progress Notes (Signed)
Occupational Therapy Treatment Patient Details Name: Krista Maddox MRN: [id number] DOB: [birth year] Today's Date: 08/18/2017    History of present illness Pt is a 31 y/o female admitted secondary to L sided numbness (Pt is L Hand Dominant). 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 PMH including but not limited to migraines.   OT comments  Pt participated in ADL retraining session today with focus on functonal use/coordination L dominant hand. She benefits from use of red foam for increased grip surface area (for eating and grooming tasks) and was also issued a HEP for FM/coordination today. Cont to recommend out-pt OT as pt is a full time student and mother of an                40 y.o.Consider issuing pytty for grip/pinch strength.   Follow Up Recommendations  Outpatient OT;Supervision - Intermittent    Equipment Recommendations  None recommended by OT    Recommendations for Other Services      Precautions / Restrictions Precautions Precautions: None Restrictions Weight Bearing Restrictions: No       Mobility Bed Mobility Overal bed mobility: Independent                Transfers Overall transfer level: Independent                    Balance Overall balance assessment: Independent                                         ADL either performed or assessed with clinical judgement   ADL Overall ADL's : Needs assistance/impaired Eating/Feeding: Sitting;With adaptive utensils;Modified independent Eating/Feeding Details (indicate cue type and reason): Currently using red cylindrical foam to build up handles of utensils. Cont to have c/o paresthesias/numbness L hand and face Grooming: Wash/dry hands;Wash/dry face;Supervision/safety;Standing;Oral care Grooming Details (indicate cue type and reason): Grooming standing at sink. Used red foam on tooth brush handle to increase grip surface area and improve  coordination. Reports difficulty doing her hair         Upper Body Dressing : Minimal assistance;Sitting Upper Body Dressing Details (indicate cue type and reason): Assist to tie, untie gown as pt attempted w/ left hand and was unable                   General ADL Comments: Pt participated in ADL retraining session and was issued a HEP for FM/Coordination. Coordination/FM manipulation tasks and grip strength are impaired as pt is L hand dominant. She should benefit from out-pt OT with focus on grip strength/coordination as she is a full time student. Pt also has 36 y/o daughter.     Vision Baseline Vision/History: Wears glasses Wears Glasses: At all times Patient Visual Report: No change from baseline     Perception     Praxis      Cognition Arousal/Alertness: Awake/alert Behavior During Therapy: WFL for tasks assessed/performed Overall Cognitive Status: Within Functional Limits for tasks assessed                                 General Comments: Good reasoning, insight, and awareness during functional tasks.         Exercises Other Exercises Other Exercises: Pt was issued HEP for FM control/coordination. Handout printed, issued and reviewed  for 2-3 x/day x10 min L hand. As well as using L dominant hand for all functional activity (school/computer work, grooming, dressing etc) pt verbalized understanding of this. She should also benefit from theraputty for L grip and pinch strengthening in the future.   Shoulder Instructions       General Comments      Pertinent Vitals/ Pain       Pain Assessment: Faces Faces Pain Scale: Hurts a little bit Pain Location: headache (frontal) Pain Descriptors / Indicators: Headache Pain Intervention(s): Limited activity within patient's tolerance;Monitored during session  Home Living                                          Prior Functioning/Environment              Frequency  Min 2X/week         Progress Toward Goals  OT Goals(current goals can now be found in the care plan section)  Progress towards OT goals: Progressing toward goals  Acute Rehab OT Goals Patient Stated Goal: Regain feeling, coordination and strength in L hand  Plan Discharge plan remains appropriate    Co-evaluation                 AM-PAC PT "6 Clicks" Daily Activity     Outcome Measure   Help from another person eating meals?: A Little Help from another person taking care of personal grooming?: A Little Help from another person toileting, which includes using toliet, bedpan, or urinal?: None Help from another person bathing (including washing, rinsing, drying)?: None Help from another person to put on and taking off regular upper body clothing?: A Little Help from another person to put on and taking off regular lower body clothing?: A Little 6 Click Score: 20    End of Session    OT Visit Diagnosis: Ataxia, unspecified (R27.0);Hemiplegia and hemiparesis Hemiplegia - Right/Left: Left Hemiplegia - dominant/non-dominant: Dominant Hemiplegia - caused by: Cerebral infarction   Activity Tolerance Patient tolerated treatment well   Patient Left in bed;with call bell/phone within reach   Nurse Communication Other (comment) (L hand dominant/coordination and L facial numbness )        Time: [id number] OT Time Calculation (min): 22 min  Charges: OT General Charges $OT Visit: 1 Visit OT Treatments $Self Care/Home Management : 8-22 mins  Eryn Krejci, OTR/L 08/18/17 9:22 AM    Janeya Deyo Beth Dixon 08/18/2017

## 2017-08-19 ENCOUNTER — Encounter (HOSPITAL_COMMUNITY): Payer: Self-pay

## 2017-08-19 ENCOUNTER — Inpatient Hospital Stay (HOSPITAL_COMMUNITY): Payer: Self-pay

## 2017-08-19 ENCOUNTER — Other Ambulatory Visit (HOSPITAL_COMMUNITY): Payer: Self-pay

## 2017-08-19 DIAGNOSIS — I6310 Cerebral infarction due to embolism of unspecified precerebral artery: Secondary | ICD-10-CM

## 2017-08-19 DIAGNOSIS — I639 Cerebral infarction, unspecified: Secondary | ICD-10-CM

## 2017-08-19 DIAGNOSIS — F17200 Nicotine dependence, unspecified, uncomplicated: Secondary | ICD-10-CM

## 2017-08-19 LAB — LUPUS ANTICOAGULANT
DPT: 34.2 s (ref 0.0–55.0)
DRVVT: 32.6 s (ref 0.0–47.0)
PTT LA: 32.8 s (ref 0.0–51.9)
Thrombin Time: 18.2 s (ref 0.0–23.0)
dPT Confirm Ratio: 1.16 Ratio (ref 0.00–1.40)

## 2017-08-19 LAB — ANTINUCLEAR ANTIBODIES, IFA: ANTINUCLEAR ANTIBODIES, IFA: NEGATIVE

## 2017-08-19 MED ORDER — ATORVASTATIN CALCIUM 10 MG PO TABS: 10.0000 mg | ORAL_TABLET | Freq: Every day | ORAL | 0 refills | Status: DC

## 2017-08-19 MED ORDER — ASPIRIN 81 MG PO CHEW: 81.0000 mg | CHEWABLE_TABLET | Freq: Every day | ORAL | 0 refills | Status: DC

## 2017-08-19 NOTE — Care Management Note (Signed)
Case Management Note  Patient Details  Name: Krista Maddox MRN: [id number] Date of Birth: October 18, 1986  Subjective/Objective:                    Action/Plan: CM met with the patient and she states she will be in hospital until Monday for TEE. CM left coupons for her medication on the front of the chart in case pt discharges over the weekend. Bedside RN updated.  Expected Discharge Date:                  Expected Discharge Plan:     In-House Referral:     Discharge planning Services     Post Acute Care Choice:    Choice offered to:     DME Arranged:    DME Agency:     HH Arranged:    HH Agency:     Status of Service:     If discussed at [employer], dates discussed:    Additional Comments:  Pollie Friar, RN 08/19/2017, 3:56 PM

## 2017-08-19 NOTE — Progress Notes (Addendum)
STROKE TEAM PROGRESS NOTE   HISTORY OF PRESENT ILLNESS (per record) Left-sided weakness  History is obtained from: Patient  HPI: Krista Maddox is a 31 y.o. female with a history of TTP, pseudotumor cerebri who presents with left-sided weakness that started abruptly days ago. States that she had a sudden onset of dizziness in the left side when abruptly numb.     LKW: Monday tpa given?: no, outside of window  == MRI  Reviewed. Embolic appearing strokes in right cerebral and right cerebellar hemisphere.  Rec: HgbA1c, fasting lipid panel Frequent neuro checks Echocardiogram Prophylactic therapy-Antiplatelet med: Aspirin - dose  PO or  PR Risk factor modification Telemetry monitoring PT consult, OT consult, Speech consult Will benefit from hypercoagulable workup as deeemed appropriate by stroke team.  Patient was not administered IV t-PA secondary to  Delay in arrival She was admitted to the neuro floor for further evaluation and treatment.   SUBJECTIVE (INTERVAL HISTORY) Her family  is not at the bedside.   No complaints. Mild left hand weakness persists  OBJECTIVE Temp:  [97.7 F (36.5 C)-98.2 F (36.8 C)] 97.7 F (36.5 C) (09/07 2119) Pulse Rate:  [73-100] 100 (09/07 2119) Cardiac Rhythm: Normal sinus rhythm (09/07 2123) Resp:  [18] 18 (09/07 2119) BP: (102-124)/(63-80) 124/80 (09/07 2119) SpO2:  [98 %-99 %] 99 % (09/07 2119)  CBC:   Recent Labs Lab 08/16/17 1810  WBC 9.3  NEUTROABS 5.6  HGB 13.0  HCT 39.5  MCV 93.4  PLT 218    Basic Metabolic Panel:   Recent Labs Lab 08/16/17 1810  NA 138  K 3.7  CL 109  CO2 24  GLUCOSE 95  BUN 13  CREATININE 0.79  CALCIUM 8.7*    Lipid Panel:     Component Value Date/Time   CHOL 172 08/18/2017 0546   TRIG 239 (H) 08/18/2017 0546   HDL 35 (L) 08/18/2017 0546   CHOLHDL 4.9 08/18/2017 0546   VLDL 48 (H) 08/18/2017 0546   LDLCALC 89 08/18/2017 0546   HgbA1c:  Lab Results  Component  Value Date   HGBA1C 4.8 08/18/2017   Urine Drug Screen:     Component Value Date/Time   LABOPIA NONE DETECTED 08/18/2017 1723   COCAINSCRNUR NONE DETECTED 08/18/2017 1723   LABBENZ NONE DETECTED 08/18/2017 1723   AMPHETMU NONE DETECTED 08/18/2017 1723   THCU NONE DETECTED 08/18/2017 1723   LABBARB NONE DETECTED 08/18/2017 1723    Alcohol Level No results found for: ETH  IMAGING  No results found.     PHYSICAL EXAM Young lady not in distress. . Afebrile. Head is nontraumatic. Neck is supple without bruit.    Cardiac exam no murmur or gallop. Lungs are clear to auscultation. Distal pulses are well felt. Neurological Exam :  Awake alert oriented x 3 normal speech and language. Mild left lower face asymmetry. Tongue midline. No drift. Mild diminished fine finger movements on left. Orbits right over left upper extremity. Mild left grip weak.. Normal sensation . Normal coordination. Gait not tested ASSESSMENT/PLAN Krista Maddox is a 31 y.o. 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    Rt MCA and cerebellar branch embolic infarcts.source undetermined. H/o birth control pills, smoking and migraines may be contributing  Resultant  Mild left hemiplegia  CT head normal  MRI head right frontal MCA branch and right  cerebellar embolic infarcts  CTA neck and brain no large vessel occlusion or stenosis  Carotid Doppler  Not done 2D Echo  Left ventricle: The cavity size was normal. Systolic function was   normal. The estimated ejection fraction was in the range of 55%   to 60%. Wall motion was normal; there were no regional wall    motion abnormalities.  LDL 89  HgbA1c 4.8  SCDs for VTE prophylaxis Diet Heart Room service appropriate? Yes; Fluid consistency: Thin Diet - low sodium heart healthy  No antiplatelets  prior to admission, now on aspirin   Patient counseled to be compliant with her antithrombotic medications  Ongoing aggressive stroke risk factor management  Therapy recommendations:  pending  Disposition:  pending  Hypertension   Stable  Permissive hypertension (OK if < 220/120) but gradually normalize in 5-7 days  Long-term BP goal normotensive  Hyperlipidemia  Home meds: none  LDL 89, goal < 70  Add statin  Continue statin at discharge  Diabetes  HgbA1c4.8 goal < 7.0   Controlled  Other Stroke Risk Factors  Cigarette smoker- Patient advised to stop smoking    Obesity, Body mass index is 33.28 kg/m., recommend weight loss, diet and exercise as appropriate     Migraines At risk for Obstructive sleep apnea,   Other Active Problems     Hospital day # 2    Continue aspirin. Check labs for vasculitis, hypercoagulability given young age and TEE for PFO/cardiac source of embolism.Check TCD Bubble study for PFO todayGreater than 50%time during this 25 minute visit was spent on counselling and coordination of care about her embolic strokes, discussion about evaluation and treatment plan and answering questions.Consider participation in [place] [facility] stroke registry and patient is interested. TelemLa Rositaetry monitoring for graeter than 24 hours has shown no AFIB Delia HeadyPramod Sethi, MD Medical Director [facility] Pager: [phone] 08/19/2017 10:06 PM   To contact Stroke Continuity provider, please refer to [email or web address]. After hours, contact General Neurology

## 2017-08-19 NOTE — Discharge Summary (Signed)
Physician Discharge Summary  Krista Maddox RUE:[id number] DOB: Feb 03, 1986 DOA: 08/16/2017  PCP: Patient, No Pcp Per  Admit date: 08/16/2017 Discharge date: 08/19/2017  Admitted From: home  Disposition:  home   Recommendations for Outpatient Follow-up:  1. F/u neurology     Discharge Condition: stable   CODE STATUS:  Full code   Consultations:  Neurology  cardiology    Discharge Diagnoses:  Principal Problem:   Cerebral embolism with cerebral infarction Active Problems:   Pseudotumor cerebri   Chronic migraine   Numbness on left side    Subjective: Minimally verbal  Brief Summary: Krista L Matthewsis a 31 y.o.femalewith medical history significant of TTP (while pregnant), IIH, migraines. Patient presents to the ED at [facility] with c/o new onset L sided numbness. Found to have multiple strokes on MRI. Denies using injection drugs.  FAmily Hx of CVAs in great  Grandparents but no blood issues known.   Hospital Course:  Multiple embolic appearing CVAs -MRI Small acute infarctions in the right cerebellum. Several small acute infarctions in the right posterior frontal region - ECHO: Left ventricle: The cavity size was normal. Systolic function was   normal. The estimated ejection fraction was in the range of 55%   to 60%. Wall motion was normal; there were no regional wall   motion abnormalities. -CTA : patent arteries -FLP: LDL 89 -HgbA1C: 4.8 -have started Lipitor and ASA 81 mg - will need TEE on Monday - transcranial doppler result> trivial PFO size  - advised to quit  Incidental cervical lymphadenopathy outpatient follow up -denies recent sore throat/URI  Mildly elevated troponin -flat trend  H/o psuedotumor cerebri -follow with Dr. Terrace Arabia  Tobacco abuse - advised to quit  Discharge Instructions  Discharge Instructions    Diet - low sodium heart healthy    Complete by:  As directed    Increase activity slowly    Complete by:  As directed       Allergies as of 08/19/2017   No Known Allergies     Medication List    TAKE these medications   aspirin 81 MG chewable tablet Chew 1 tablet (81 mg total) by mouth daily.   atorvastatin 10 MG tablet Commonly known as:  LIPITOR Take 1 tablet (10 mg total) by mouth daily at 6 PM.   butalbital-acetaminophen-caffeine 50-325-40 MG tablet Commonly known as:  FIORICET, ESGIC Take 1 tablet by mouth every 6 (six) hours as needed for headache.   fluticasone 50 MCG/ACT nasal spray Commonly known as:  FLONASE Place 2 sprays into both nostrils daily.   gabapentin 300 MG capsule Commonly known as:  NEURONTIN Take 1 capsule (300 mg total) by mouth 3 (three) times daily. What changed:  when to take this   Magnesium Oxide 400 (240 Mg) MG Tabs Take 400 mg by mouth 2 (two) times daily.   meloxicam 15 MG tablet Commonly known as:  MOBIC Take 1 tablet (15 mg total) by mouth daily. Please call [phone] to scheduled appt.   MONONESSA 0.25-35 MG-MCG tablet Generic drug:  norgestimate-ethinyl estradiol Take 1 tablet by mouth daily.   nortriptyline 50 MG capsule Commonly known as:  PAMELOR Take 1 capsule (50 mg total) by mouth at bedtime.   Riboflavin 100 MG Tabs Take 1 tablet (100 mg total) by mouth 2 (two) times daily.   rizatriptan 10 MG disintegrating tablet Commonly known as:  MAXALT-MLT Take 1 tablet (10 mg total) by mouth as needed. May repeat in 2 hours if needed  topiramate 100 MG tablet Commonly known as:  TOPAMAX TAKE ONE AND ONE-HALF TABLETS BY MOUTH TWICE DAILY What changed:  See the new instructions.            Discharge Care Instructions        Start     Ordered   08/20/17 0000  aspirin 81 MG chewable tablet  Daily     08/19/17 1523   08/19/17 0000  atorvastatin (LIPITOR) 10 MG tablet  Daily-1800     08/19/17 1523   08/19/17 0000  Increase activity slowly     08/19/17 1523   08/19/17 0000  Diet - low sodium heart healthy     08/19/17 1523     Follow-up  Information    [facility] Follow up on 09/06/2017.   Why:  Your appointment time is 1 pm. Please arrive 15 min early and bring a picture ID and your current medications. Contact information: [address] [place] [phone]         No Known Allergies   Procedures/Studies: Carotid duplex 2 d ECHO  Ct Angio Head W Or Wo Contrast  Result Date: 08/17/2017 CLINICAL DATA:  31 y/o F; dizziness and left-sided numbness. History of TTP. EXAM: CT ANGIOGRAPHY HEAD AND NECK TECHNIQUE: Multidetector CT imaging of the head and neck was performed using the standard protocol during bolus administration of intravenous contrast. Multiplanar CT image reconstructions and MIPs were obtained to evaluate the vascular anatomy. Carotid stenosis measurements (when applicable) are obtained utilizing NASCET criteria, using the distal internal carotid diameter as the denominator. CONTRAST:  50 cc Isovue 370 COMPARISON:  08/16/2017 CT head FINDINGS: CTA NECK FINDINGS Aortic arch: Bovine variant branching. Imaged portion shows no evidence of aneurysm or dissection. No significant stenosis of the major arch vessel origins. Right carotid system: No evidence of dissection, stenosis (50% or greater) or occlusion. Left carotid system: No evidence of dissection, stenosis (50% or greater) or occlusion. Vertebral arteries: Left dominant. No evidence of dissection, stenosis (50% or greater) or occlusion. Skeleton: Negative. Other neck: Bilateral level II adenopathy measuring up to 12 x 18 mm on the left (series 5: Image 99). Upper chest: Negative. Review of the MIP images confirms the above findings CTA HEAD FINDINGS Anterior circulation: No significant stenosis, proximal occlusion, aneurysm, or vascular malformation. Posterior circulation: No significant stenosis, proximal occlusion, aneurysm, or vascular malformation. Venous sinuses: As permitted by contrast timing, patent. Anatomic variants: No  anterior communicating artery. Large bilateral posterior communicating arteries. Delayed phase: No abnormal intracranial enhancement. Review of the MIP images confirms the above findings IMPRESSION: 1. Patent carotid and vertebral arteries. No dissection, aneurysm, or hemodynamically significant stenosis utilizing NASCET criteria. 2. Patent circle of Willis. No large vessel occlusion, aneurysm, or significant stenosis. 3. No abnormal enhancement of the brain. 4. Bilateral upper anterior cervical lymphadenopathy is nonspecific, possibly reactive to underlying pharyngitis or upper respiratory infection. Differential includes systemic infectious/inflammatory processes and lymphoproliferative disease. Electronically Signed   By: Mitzi HansenLance  Furusawa-Stratton M.D.   On: 08/17/2017 06:50   Ct Head Wo Contrast  Result Date: 08/16/2017 CLINICAL DATA:  Headache and LEFT-sided numbness yesterday, intermittent similar symptoms today. History of TTP. EXAM: CT HEAD WITHOUT CONTRAST TECHNIQUE: Contiguous axial images were obtained from the base of the skull through the vertex without intravenous contrast. COMPARISON:  MRI of the head June 11, 2015 FINDINGS: BRAIN: No intraparenchymal hemorrhage, mass effect nor midline shift. The ventricles and sulci are normal. No acute large vascular  territory infarcts. No abnormal extra-axial fluid collections. Basal cisterns are patent. VASCULAR: Unremarkable. SKULL/SOFT TISSUES: No skull fracture. No significant soft tissue swelling. ORBITS/SINUSES: The included ocular globes and orbital contents are normal.The mastoid aircells and included paranasal sinuses are well-aerated. OTHER: None. IMPRESSION: Normal noncontrast CT HEAD. Electronically Signed   By: Awilda Metro M.D.   On: 08/16/2017 18:49   Ct Angio Neck W Or Wo Contrast  Result Date: 08/17/2017 CLINICAL DATA:  31 y/o F; dizziness and left-sided numbness. History of TTP. EXAM: CT ANGIOGRAPHY HEAD AND NECK TECHNIQUE:  Multidetector CT imaging of the head and neck was performed using the standard protocol during bolus administration of intravenous contrast. Multiplanar CT image reconstructions and MIPs were obtained to evaluate the vascular anatomy. Carotid stenosis measurements (when applicable) are obtained utilizing NASCET criteria, using the distal internal carotid diameter as the denominator. CONTRAST:  50 cc Isovue 370 COMPARISON:  08/16/2017 CT head FINDINGS: CTA NECK FINDINGS Aortic arch: Bovine variant branching. Imaged portion shows no evidence of aneurysm or dissection. No significant stenosis of the major arch vessel origins. Right carotid system: No evidence of dissection, stenosis (50% or greater) or occlusion. Left carotid system: No evidence of dissection, stenosis (50% or greater) or occlusion. Vertebral arteries: Left dominant. No evidence of dissection, stenosis (50% or greater) or occlusion. Skeleton: Negative. Other neck: Bilateral level II adenopathy measuring up to 12 x 18 mm on the left (series 5: Image 99). Upper chest: Negative. Review of the MIP images confirms the above findings CTA HEAD FINDINGS Anterior circulation: No significant stenosis, proximal occlusion, aneurysm, or vascular malformation. Posterior circulation: No significant stenosis, proximal occlusion, aneurysm, or vascular malformation. Venous sinuses: As permitted by contrast timing, patent. Anatomic variants: No anterior communicating artery. Large bilateral posterior communicating arteries. Delayed phase: No abnormal intracranial enhancement. Review of the MIP images confirms the above findings IMPRESSION: 1. Patent carotid and vertebral arteries. No dissection, aneurysm, or hemodynamically significant stenosis utilizing NASCET criteria. 2. Patent circle of Willis. No large vessel occlusion, aneurysm, or significant stenosis. 3. No abnormal enhancement of the brain. 4. Bilateral upper anterior cervical lymphadenopathy is nonspecific,  possibly reactive to underlying pharyngitis or upper respiratory infection. Differential includes systemic infectious/inflammatory processes and lymphoproliferative disease. Electronically Signed   By: Mitzi Hansen M.D.   On: 08/17/2017 06:50   Mr Laqueta Jean YQ Contrast  Result Date: 08/17/2017 CLINICAL DATA:  History of pseudotumor. Left-sided weakness beginning several days ago. Sudden onset of dizziness. History of TTP. EXAM: MRI HEAD WITHOUT AND WITH CONTRAST TECHNIQUE: Multiplanar, multiecho pulse sequences of the brain and surrounding structures were obtained without and with intravenous contrast. CONTRAST:  15mL MULTIHANCE GADOBENATE DIMEGLUMINE 529 MG/ML IV SOLN COMPARISON:  CT studies same day.  MRI 06/11/2015. FINDINGS: Brain: There are scattered acute embolic infarctions within multiple territories consistent with micro emboli from the heart or ascending aorta. There are a few small subcentimeter acute infarctions in the right cerebellum. Left cerebellum is normal. Brainstem is normal. Left cerebral hemisphere is normal. There are scattered acute infarctions within the right posterior frontal region consistent with embolic infarctions within right MCA branch vessels. No large confluent infarction. No mass effect or hemorrhage. No shift. No evidence of old insults. No hydrocephalus. No extra-axial collection. No abnormal contrast enhancement. Vascular: Major vessels at the base of the brain show flow. Skull and upper cervical spine: Negative Sinuses/Orbits: Clear/normal Other: None IMPRESSION: Small acute infarctions in the right cerebellum. Several small acute infarctions in the right posterior frontal region. No  large confluent infarction, hemorrhage or mass effect. Small acute infarctions in multiple vascular territories suggests embolic disease from the heart or ascending aorta. Electronically Signed   By: Paulina Fusi M.D.   On: 08/17/2017 12:12       Discharge Exam: Vitals:    08/19/17 0100 08/19/17 0507  BP: 113/63 102/67  Pulse: 73 80  Resp: 18 18  Temp: 98.2 F (36.8 C) 97.7 F (36.5 C)  SpO2: 98% 98%   Vitals:   08/18/17 1814 08/18/17 2100 08/19/17 0100 08/19/17 0507  BP: 127/69 120/62 113/63 102/67  Pulse: 79 75 73 80  Resp: Temp: 98.5 F (36.9 C) 98.1 F (36.7 C) 98.2 F (36.8 C) 97.7 F (36.5 C)  TempSrc: Oral Oral Oral Oral  SpO2: 100% 99% 98% 98%  Weight:      Height:        General: Pt is alert, awake, not in acute distress Cardiovascular: RRR, S1/S2 +, no rubs, no gallops Respiratory: CTA bilaterally, no wheezing, no rhonchi Abdominal: Soft, NT, ND, bowel sounds + Extremities: no edema, no cyanosis    The results of significant diagnostics from this hospitalization (including imaging, microbiology, ancillary and laboratory) are listed below for reference.     Microbiology: No results found for this or any previous visit (from the past 240 hour(s)).   Labs: BNP (last 3 results) No results for input(s): BNP in the last 8760 hours. Basic Metabolic Panel:  Recent Labs Lab 08/16/17 1810  NA 138  K 3.7  CL 109  CO2 24  GLUCOSE 95  BUN 13  CREATININE 0.79  CALCIUM 8.7*   Liver Function Tests:  Recent Labs Lab 08/16/17 1810  AST 15  ALT 7*  ALKPHOS 54  BILITOT 0.3  PROT 7.1  ALBUMIN 3.4*   No results for input(s): LIPASE, AMYLASE in the last 168 hours. No results for input(s): AMMONIA in the last 168 hours. CBC:  Recent Labs Lab 08/16/17 1810  WBC 9.3  NEUTROABS 5.6  HGB 13.0  HCT 39.5  MCV 93.4  PLT 218   Cardiac Enzymes:  Recent Labs Lab 08/16/17 1810 08/16/17 2240  TROPONINI 0.12* 0.11*   BNP: Invalid input(s): POCBNP CBG:  Recent Labs Lab 08/16/17 1841  GLUCAP 92   D-Dimer No results for input(s): DDIMER in the last 72 hours. Hgb A1c  Recent Labs  08/18/17 0546  HGBA1C 4.8   Lipid Profile  Recent Labs  08/18/17 0546  CHOL 172  HDL 35*  LDLCALC 89  TRIG  239*  CHOLHDL 4.9   Thyroid function studies No results for input(s): TSH, T4TOTAL, T3FREE, THYROIDAB in the last 72 hours.  Invalid input(s): FREET3 Anemia work up No results for input(s): VITAMINB12, FOLATE, FERRITIN, TIBC, IRON, RETICCTPCT in the last 72 hours. Urinalysis    Component Value Date/Time   COLORURINE YELLOW 08/16/2017 1815   APPEARANCEUR CLOUDY (A) 08/16/2017 1815   LABSPEC 1.020 08/16/2017 1815   PHURINE 7.0 08/16/2017 1815   GLUCOSEU NEGATIVE 08/16/2017 1815   HGBUR NEGATIVE 08/16/2017 1815   HGBUR negative 11/24/2009 0847   BILIRUBINUR NEGATIVE 08/16/2017 1815   KETONESUR NEGATIVE 08/16/2017 1815   PROTEINUR NEGATIVE 08/16/2017 1815   UROBILINOGEN 0.2 03/13/2012 0023   NITRITE NEGATIVE 08/16/2017 1815   LEUKOCYTESUR NEGATIVE 08/16/2017 1815   Sepsis Labs Invalid input(s): PROCALCITONIN,  WBC,  LACTICIDVEN Microbiology No results found for this or any previous visit (from the past 240 hour(s)).   Time coordinating discharge: Over 30  minutes  SIGNED:   Calvert Cantor, MD  [facility] 08/19/2017, 3:29 PM Pager   If 7PM-7AM, please contact night-coverage [email or web address] Password [email or web address]

## 2017-08-19 NOTE — Progress Notes (Signed)
Transcranial Doppler bubble study completed.  Verbal consent taken. Risks/ benefits explained.  Right MCA insonated.  Left forearm IV used.  Dr.Sethi performed.  HITS heard at rest: Yes HITS heard during Valsalva: Yes  Trivial PFO size.   Farrel DemarkJill Eunice, RDMS, RVT

## 2017-08-19 NOTE — Progress Notes (Signed)
    [facility] has been requested to perform a transesophageal echocardiogram on Krista Maddox for stroke.  After careful review of history and examination, the risks and benefits of transesophageal echocardiogram have been explained including risks of esophageal damage, perforation (1:10,000 risk), bleeding, pharyngeal hematoma as well as other potential complications associated with conscious sedation including aspiration, arrhythmia, respiratory failure and death. Alternatives to treatment were discussed, questions were answered. Patient is willing to proceed.   She is scheduled for TEE with Dr. Osamah Schmader Salviaandolph on Monday 08/22/17 at 1pm. NPO at MN Sunday night.  Roe Rutherfordngela Nicole Zareya Tuckett, [place]  08/19/2017 4:18 PM

## 2017-08-19 NOTE — Consult Note (Signed)
 Cardiology Consultation:   Patient ID: Krista Maddox; [id number]; 11/10/1986   Admit date: 08/16/2017 Date of Consult: 08/19/2017  Primary Care Provider: Patient, No Pcp Per Primary Cardiologist: new - Dr. Waller Marcussen Primary Electrophysiologist:     Patient Profile:   Krista Maddox is a 31 y.o. female with a hx of TTP, pseudotumor cerebri, chronic migraine, and is current smoker who is being seen today for the evaluation of TEE following stroke at the request of Dr. Vann.  History of Present Illness:   Krista Maddox presented to [facility] with new left-sided numbness. 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 She has a history of smoking and is on oral contraception.   LDL is 89 and A1c is 4.8. She was started on ASA and statin. Cardiology was consulted to perform TEE in endoscopy to rule out a cardiac source of emboli; however, scheduling cannot accommodate her until Monday. She may discharge over the weekend and report back to [facility] on Monday at 11:30AM. She was instructed to be NPO at [facility] Sunday night.   On our interview, she has residual numbness and weakness in her left hand, no issues ambulating in halls. She denies palpitations and previous cardiac history.     Past Medical History:  Diagnosis Date  . DDD (degenerative disc disease), lumbar   . Headache   . Other disorders of optic nerve, not elsewhere classified, unspecified eye   . TTP (thrombotic thrombocytopenic purpura) (HCC)     Past Surgical History:  Procedure Laterality Date  . CESAREAN SECTION    . CHOLECYSTECTOMY    . WISDOM TOOTH EXTRACTION       Home Medications:  Prior to Admission medications   Medication Sig Start Date End Date Taking? Authorizing Provider  gabapentin (NEURONTIN) 300 MG capsule Take 1 capsule (300 mg total) by mouth 3 (three) times daily. Patient taking differently: Take 300 mg by mouth 2  (two) times daily.  10/13/16  Yes Yan, Yijun, MD  Magnesium Oxide 400 (240 Mg) MG TABS Take 400 mg by mouth 2 (two) times daily. 06/01/16  Yes Yan, Yijun, MD  meloxicam (MOBIC) 15 MG tablet Take 1 tablet (15 mg total) by mouth daily. Please call [phone] to scheduled appt. 05/18/17  Yes Yan, Yijun, MD  norgestimate-ethinyl estradiol (MONONESSA) 0.25-35 MG-MCG tablet Take 1 tablet by mouth daily.   Yes [provider]  nortriptyline (PAMELOR) 50 MG capsule Take 1 capsule (50 mg total) by mouth at bedtime. 05/18/17  Yes Yan, Yijun, MD  Riboflavin 100 MG TABS Take 1 tablet (100 mg total) by mouth 2 (two) times daily. 06/01/16  Yes Yan, Yijun, MD  rizatriptan (MAXALT-MLT) 10 MG disintegrating tablet Take 1 tablet (10 mg total) by mouth as needed. May repeat in 2 hours if needed 01/05/16  Yes Yan, Yijun, MD  topiramate (TOPAMAX) 100 MG tablet TAKE ONE AND ONE-HALF TABLETS BY MOUTH TWICE DAILY Patient taking differently: TAKE 150MG BY MOUTH TWICE DAILY 07/26/16  Yes Yan, Yijun, MD  aspirin 81 MG chewable tablet Chew 1 tablet (81 mg total) by mouth daily. 08/20/17   Rizwan, Saima, MD  atorvastatin (LIPITOR) 10 MG tablet Take 1 tablet (10 mg total) by mouth daily at 6 PM. 08/19/17   Rizwan, Saima, MD  butalbital-acetaminophen-caffeine (FIORICET, ESGIC) 50-325-40 MG per tablet Take 1 tablet by mouth every 6 (six) hours as needed for headache. Patient not taking: Reported on 08/17/2017 06/26/15     Yan, Yijun, MD  fluticasone (FLONASE) 50 MCG/ACT nasal spray Place 2 sprays into both nostrils daily. Patient not taking: Reported on 08/17/2017 01/13/17 01/23/17  Leath, Christie Janell, NP    Inpatient Medications: Scheduled Meds: . aspirin  81 mg Oral Daily  . atorvastatin  10 mg Oral q1800  . gabapentin  300 mg Oral TID  . heparin  5,000 Units Subcutaneous Q8H  . magnesium oxide  400 mg Oral BID  . meloxicam  15 mg Oral Q breakfast  . nortriptyline  50 mg Oral QHS  . topiramate  150 mg Oral BID   Continuous  Infusions:  PRN Meds: acetaminophen **OR** acetaminophen, ibuprofen, ondansetron **OR** ondansetron (ZOFRAN) IV  Allergies:   No Known Allergies  Social History:   Social History   Social History  . Marital status: Single    Spouse name: N/A  . Number of children: 1  . Years of education: In college   Occupational History  . Dispatcher    Social History Main Topics  . Smoking status: Current Every Day Smoker    Packs/day: 0.50  . Smokeless tobacco: Never Used  . Alcohol use No  . Drug use: No  . Sexual activity: Yes    Birth control/ protection: Pill   Other Topics Concern  . Not on file   Social History Narrative   Lives at home with her mother and daughter.   3-4 cups caffeine daily.   Left-handed.    Family History:    Family History  Problem Relation Age of Onset  . Diabetes Mother   . Hypertension Mother   . Ovarian cancer Mother        Hysterectomy  . Healthy Father   . Ovarian cancer Maternal Grandmother        Hysterectomy     ROS:  Please see the history of present illness.  ROS  All other ROS reviewed and negative.     Physical Exam/Data:   Vitals:   08/18/17 1814 08/18/17 2100 08/19/17 0100 08/19/17 0507  BP: 127/69 120/62 113/63 102/67  Pulse: 79 75 73 80  Resp: 16 18 18 18  Temp: 98.5 F (36.9 C) 98.1 F (36.7 C) 98.2 F (36.8 C) 97.7 F (36.5 C)  TempSrc: Oral Oral Oral Oral  SpO2: 100% 99% 98% 98%  Weight:      Height:       No intake or output data in the 24 hours ending 08/19/17 1537 Filed Weights   08/16/17 1742  Weight: 200 lb (90.7 kg)   Body mass index is 33.28 kg/m.  General:  Well nourished, well developed, in no acute distress HEENT: normal Lymph: no adenopathy Neck: no JVD Vascular: No carotid bruits; FA pulses 2+ bilaterally without bruits  Cardiac:  normal S1, S2; RRR; no murmur  Lungs:  clear to auscultation bilaterally, no wheezing, rhonchi or rales  Abd: soft, nontender, no hepatomegaly  Ext: no  edema Skin: warm and dry  Neuro:  CNs 2-12 intact, left hand weakness Psych:  Normal affect   EKG:  The EKG was personally reviewed and demonstrates:  NSR Telemetry:  Telemetry was personally reviewed and demonstrates:  NSR  Relevant CV Studies:  2D Echocardiogram Study Conclusions - Left ventricle: The cavity size was normal. Systolic function was   normal. The estimated ejection fraction was in the range of 55%   to 60%. Wall motion was normal; there were no regional wall   motion abnormalities.  Impressions: - No cardiac source   of emboli was indentified.   Laboratory Data:  Chemistry Recent Labs Lab 08/16/17 1810  NA 138  K 3.7  CL 109  CO2 24  GLUCOSE 95  BUN 13  CREATININE 0.79  CALCIUM 8.7*  GFRNONAA >60  GFRAA >60  ANIONGAP 5     Recent Labs Lab 08/16/17 1810  PROT 7.1  ALBUMIN 3.4*  AST 15  ALT 7*  ALKPHOS 54  BILITOT 0.3   Hematology Recent Labs Lab 08/16/17 1810  WBC 9.3  RBC 4.23  HGB 13.0  HCT 39.5  MCV 93.4  MCH 30.7  MCHC 32.9  RDW 13.4  PLT 218   Cardiac Enzymes Recent Labs Lab 08/16/17 1810 08/16/17 2240  TROPONINI 0.12* 0.11*   No results for input(s): TROPIPOC in the last 168 hours.  BNPNo results for input(s): BNP, PROBNP in the last 168 hours.  DDimer No results for input(s): DDIMER in the last 168 hours.  Radiology/Studies:  Ct Angio Head W Or Wo Contrast  Result Date: 08/17/2017 CLINICAL DATA:  31 y/o F; dizziness and left-sided numbness. History of TTP. EXAM: CT ANGIOGRAPHY HEAD AND NECK TECHNIQUE: Multidetector CT imaging of the head and neck was performed using the standard protocol during bolus administration of intravenous contrast. Multiplanar CT image reconstructions and MIPs were obtained to evaluate the vascular anatomy. Carotid stenosis measurements (when applicable) are obtained utilizing NASCET criteria, using the distal internal carotid diameter as the denominator. CONTRAST:  50 cc Isovue 370 COMPARISON:   08/16/2017 CT head FINDINGS: CTA NECK FINDINGS Aortic arch: Bovine variant branching. Imaged portion shows no evidence of aneurysm or dissection. No significant stenosis of the major arch vessel origins. Right carotid system: No evidence of dissection, stenosis (50% or greater) or occlusion. Left carotid system: No evidence of dissection, stenosis (50% or greater) or occlusion. Vertebral arteries: Left dominant. No evidence of dissection, stenosis (50% or greater) or occlusion. Skeleton: Negative. Other neck: Bilateral level II adenopathy measuring up to 12 x 18 mm on the left (series 5: Image 99). Upper chest: Negative. Review of the MIP images confirms the above findings CTA HEAD FINDINGS Anterior circulation: No significant stenosis, proximal occlusion, aneurysm, or vascular malformation. Posterior circulation: No significant stenosis, proximal occlusion, aneurysm, or vascular malformation. Venous sinuses: As permitted by contrast timing, patent. Anatomic variants: No anterior communicating artery. Large bilateral posterior communicating arteries. Delayed phase: No abnormal intracranial enhancement. Review of the MIP images confirms the above findings IMPRESSION: 1. Patent carotid and vertebral arteries. No dissection, aneurysm, or hemodynamically significant stenosis utilizing NASCET criteria. 2. Patent circle of Willis. No large vessel occlusion, aneurysm, or significant stenosis. 3. No abnormal enhancement of the brain. 4. Bilateral upper anterior cervical lymphadenopathy is nonspecific, possibly reactive to underlying pharyngitis or upper respiratory infection. Differential includes systemic infectious/inflammatory processes and lymphoproliferative disease. Electronically Signed   By: Lance  Furusawa-Stratton M.D.   On: 08/17/2017 06:50   Ct Head Wo Contrast  Result Date: 08/16/2017 CLINICAL DATA:  Headache and LEFT-sided numbness yesterday, intermittent similar symptoms today. History of TTP. EXAM: CT  HEAD WITHOUT CONTRAST TECHNIQUE: Contiguous axial images were obtained from the base of the skull through the vertex without intravenous contrast. COMPARISON:  MRI of the head June 11, 2015 FINDINGS: BRAIN: No intraparenchymal hemorrhage, mass effect nor midline shift. The ventricles and sulci are normal. No acute large vascular territory infarcts. No abnormal extra-axial fluid collections. Basal cisterns are patent. VASCULAR: Unremarkable. SKULL/SOFT TISSUES: No skull fracture. No significant soft tissue swelling. ORBITS/SINUSES: The included ocular   globes and orbital contents are normal.The mastoid aircells and included paranasal sinuses are well-aerated. OTHER: None. IMPRESSION: Normal noncontrast CT HEAD. Electronically Signed   By: Courtnay  Bloomer M.D.   On: 08/16/2017 18:49   Ct Angio Neck W Or Wo Contrast  Result Date: 08/17/2017 CLINICAL DATA:  31 y/o F; dizziness and left-sided numbness. History of TTP. EXAM: CT ANGIOGRAPHY HEAD AND NECK TECHNIQUE: Multidetector CT imaging of the head and neck was performed using the standard protocol during bolus administration of intravenous contrast. Multiplanar CT image reconstructions and MIPs were obtained to evaluate the vascular anatomy. Carotid stenosis measurements (when applicable) are obtained utilizing NASCET criteria, using the distal internal carotid diameter as the denominator. CONTRAST:  50 cc Isovue 370 COMPARISON:  08/16/2017 CT head FINDINGS: CTA NECK FINDINGS Aortic arch: Bovine variant branching. Imaged portion shows no evidence of aneurysm or dissection. No significant stenosis of the major arch vessel origins. Right carotid system: No evidence of dissection, stenosis (50% or greater) or occlusion. Left carotid system: No evidence of dissection, stenosis (50% or greater) or occlusion. Vertebral arteries: Left dominant. No evidence of dissection, stenosis (50% or greater) or occlusion. Skeleton: Negative. Other neck: Bilateral level II adenopathy  measuring up to 12 x 18 mm on the left (series 5: Image 99). Upper chest: Negative. Review of the MIP images confirms the above findings CTA HEAD FINDINGS Anterior circulation: No significant stenosis, proximal occlusion, aneurysm, or vascular malformation. Posterior circulation: No significant stenosis, proximal occlusion, aneurysm, or vascular malformation. Venous sinuses: As permitted by contrast timing, patent. Anatomic variants: No anterior communicating artery. Large bilateral posterior communicating arteries. Delayed phase: No abnormal intracranial enhancement. Review of the MIP images confirms the above findings IMPRESSION: 1. Patent carotid and vertebral arteries. No dissection, aneurysm, or hemodynamically significant stenosis utilizing NASCET criteria. 2. Patent circle of Willis. No large vessel occlusion, aneurysm, or significant stenosis. 3. No abnormal enhancement of the brain. 4. Bilateral upper anterior cervical lymphadenopathy is nonspecific, possibly reactive to underlying pharyngitis or upper respiratory infection. Differential includes systemic infectious/inflammatory processes and lymphoproliferative disease. Electronically Signed   By: Lance  Furusawa-Stratton M.D.   On: 08/17/2017 06:50   Mr Brain W Wo Contrast  Result Date: 08/17/2017 CLINICAL DATA:  History of pseudotumor. Left-sided weakness beginning several days ago. Sudden onset of dizziness. History of TTP. EXAM: MRI HEAD WITHOUT AND WITH CONTRAST TECHNIQUE: Multiplanar, multiecho pulse sequences of the brain and surrounding structures were obtained without and with intravenous contrast. CONTRAST:  15mL MULTIHANCE GADOBENATE DIMEGLUMINE 529 MG/ML IV SOLN COMPARISON:  CT studies same day.  MRI 06/11/2015. FINDINGS: Brain: There are scattered acute embolic infarctions within multiple territories consistent with micro emboli from the heart or ascending aorta. There are a few small subcentimeter acute infarctions in the right cerebellum.  Left cerebellum is normal. Brainstem is normal. Left cerebral hemisphere is normal. There are scattered acute infarctions within the right posterior frontal region consistent with embolic infarctions within right MCA branch vessels. No large confluent infarction. No mass effect or hemorrhage. No shift. No evidence of old insults. No hydrocephalus. No extra-axial collection. No abnormal contrast enhancement. Vascular: Major vessels at the base of the brain show flow. Skull and upper cervical spine: Negative Sinuses/Orbits: Clear/normal Other: None IMPRESSION: Small acute infarctions in the right cerebellum. Several small acute infarctions in the right posterior frontal region. No large confluent infarction, hemorrhage or mass effect. Small acute infarctions in multiple vascular territories suggests embolic disease from the heart or ascending aorta. Electronically Signed     By: Britini Garcilazo  Shogry M.D.   On: 08/17/2017 12:12    Assessment and Plan:   1. CVA - recommend TEE to determine if there is a cardiac source of emboli - she may discharge home and return on 08/22/17 for TEE with Dr. Delavan, scheduled for 1pm, arrival to [facility] at 11:30 AM NPO.  - may recommend event monitor to screen for atrial arrhythmias if no cardiac source is found on TEE - she expressed understanding of the plan, including risks and benefits.    For questions or updates, please contact [facility] Please consult [email or web address] for contact info under Cardiology/STEMI. Daytime calls, contact the Day Call APP (6a-8a) or assigned team (Teams A-D) provider (7:30a - 5p). All other daytime calls (7:30-5p), contact the Card Master @ [phone].   Nighttime calls, contact the assigned APP (5p-8p) or MD (6:30p-8p). Overnight calls (8p-6a), contact the on call Fellow @ [phone].   Signed, Angela Nicole Duke, PA  08/19/2017 3:37 PM   Personally seen and examined. Agree with above.  31 year old with stroke.  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 Discussed risks and benefits. Willing to proceed.   - Consider event monitor  Taeshaun Rames, MD    

## 2017-08-20 ENCOUNTER — Inpatient Hospital Stay (HOSPITAL_COMMUNITY): Payer: Self-pay

## 2017-08-20 DIAGNOSIS — I6340 Cerebral infarction due to embolism of unspecified cerebral artery: Secondary | ICD-10-CM

## 2017-08-20 DIAGNOSIS — Z862 Personal history of diseases of the blood and blood-forming organs and certain disorders involving the immune mechanism: Secondary | ICD-10-CM

## 2017-08-20 DIAGNOSIS — I639 Cerebral infarction, unspecified: Secondary | ICD-10-CM

## 2017-08-20 LAB — CBC
HEMATOCRIT: 42.8 % (ref 36.0–46.0)
Hemoglobin: 13.7 g/dL (ref 12.0–15.0)
MCH: 29.7 pg (ref 26.0–34.0)
MCHC: 32 g/dL (ref 30.0–36.0)
MCV: 92.8 fL (ref 78.0–100.0)
Platelets: 212 10*3/uL (ref 150–400)
RBC: 4.61 MIL/uL (ref 3.87–5.11)
RDW: 13.4 % (ref 11.5–15.5)
WBC: 8.1 10*3/uL (ref 4.0–10.5)

## 2017-08-20 LAB — BASIC METABOLIC PANEL
ANION GAP: 7 (ref 5–15)
BUN: 15 mg/dL (ref 6–20)
CHLORIDE: 108 mmol/L (ref 101–111)
CO2: 23 mmol/L (ref 22–32)
Calcium: 8.9 mg/dL (ref 8.9–10.3)
Creatinine, Ser: 0.94 mg/dL (ref 0.44–1.00)
GFR calc Af Amer: >60 mL/min (ref >60)
GFR calc non Af Amer: >60 mL/min (ref >60)
GLUCOSE: 99 mg/dL (ref 65–99)
POTASSIUM: 3.6 mmol/L (ref 3.5–5.1)
Sodium: 138 mmol/L (ref 135–145)

## 2017-08-20 LAB — C-REACTIVE PROTEIN: CRP: 1.4 mg/dL — AB (ref <1.0)

## 2017-08-20 LAB — BETA-2-GLYCOPROTEIN I ABS, IGG/M/A
Beta-2 Glyco I IgG: <9 GPI IgG units (ref 0–20)
Beta-2-Glycoprotein I IgA: <9 GPI IgA units (ref 0–25)
Beta-2-Glycoprotein I IgM: <9 GPI IgM units (ref 0–32)

## 2017-08-20 MED ORDER — ATORVASTATIN CALCIUM 10 MG PO TABS: 10.0000 mg | ORAL_TABLET | Freq: Every day | ORAL | 0 refills | Status: DC

## 2017-08-20 NOTE — Progress Notes (Signed)
Patient given discharge instructions.  Family member at bedside.  All questions and concerns addressed.  Patient left unit by wheelchair accompanied by staff.

## 2017-08-20 NOTE — Progress Notes (Signed)
VASCULAR LAB PRELIMINARY  PRELIMINARY  PRELIMINARY  PRELIMINARY  Bilateral lower extremity venous duplex completed.    Preliminary report:  There is no DVT or SVT noted in the bilateral lower extremities.   Ebony Rickel, RVT 08/20/2017, 10:11 AM

## 2017-08-20 NOTE — Progress Notes (Signed)
STROKE TEAM PROGRESS NOTE   SUBJECTIVE (INTERVAL HISTORY) Her family  is not at the bedside. No complaints. Mild left hand weakness persists. Pending TEE Monday. Hypercoagulable workup pending.  OBJECTIVE Temp:  [97.7 F (36.5 C)-98.2 F (36.8 C)] 97.8 F (36.6 C) (09/08 0440) Pulse Rate:  [74-100] 74 (09/08 0440) Cardiac Rhythm: Normal sinus rhythm (09/07 2123) Resp:  [18] 18 (09/08 0440) BP: (104-124)/(58-80) 104/58 (09/08 0440) SpO2:  [97 %-99 %] 97 % (09/08 0440)  CBC:   Recent Labs Lab 08/16/17 1810 08/20/17 0353  WBC 9.3 8.1  NEUTROABS 5.6  --   HGB 13.0 13.7  HCT 39.5 42.8  MCV 93.4 92.8  PLT 218 212    Basic Metabolic Panel:   Recent Labs Lab 08/16/17 1810 08/20/17 0353  NA 138 138  K 3.7 3.6  CL 109 108  CO2 24 23  GLUCOSE 95 99  BUN 13 15  CREATININE 0.79 0.94  CALCIUM 8.7* 8.9    Lipid Panel:     Component Value Date/Time   CHOL 172 08/18/2017 0546   TRIG 239 (H) 08/18/2017 0546   HDL 35 (L) 08/18/2017 0546   CHOLHDL 4.9 08/18/2017 0546   VLDL 48 (H) 08/18/2017 0546   LDLCALC 89 08/18/2017 0546   HgbA1c:  Lab Results  Component Value Date   HGBA1C 4.8 08/18/2017   Urine Drug Screen:     Component Value Date/Time   LABOPIA NONE DETECTED 08/18/2017 1723   COCAINSCRNUR NONE DETECTED 08/18/2017 1723   LABBENZ NONE DETECTED 08/18/2017 1723   AMPHETMU NONE DETECTED 08/18/2017 1723   THCU NONE DETECTED 08/18/2017 1723   LABBARB NONE DETECTED 08/18/2017 1723    Alcohol Level No results found for: ETH  IMAGING I have personally reviewed the radiological images below and agree with the radiology interpretations.  Mr Krista JeanBrain W Wo Contrast 08/17/2017 IMPRESSION:  Small acute infarctions in the right cerebellum. Several small acute infarctions in the right posterior frontal region.  No large confluent infarction, hemorrhage or mass effect.  Small acute infarctions in multiple vascular territories suggests embolic disease from the heart or  ascending aorta.   CTA Head and Neck 08/17/2017 IMPRESSION: 1. Patent carotid and vertebral arteries. No dissection, aneurysm, or hemodynamically significant stenosis utilizing NASCET criteria. 2. Patent circle of Willis. No large vessel occlusion, aneurysm, or significant stenosis. 3. No abnormal enhancement of the brain. 4. Bilateral upper anterior cervical lymphadenopathy is nonspecific, possibly reactive to underlying pharyngitis or upper respiratory infection. Differential includes systemic infectious/inflammatory processes and lymphoproliferative disease.  CT Head Wo Contrast 08/16/2017 IMPRESSION:  Normal noncontrast CT HEAD.  Transthoracic Echocardiogram  08/18/2017 Study Conclusions - Left ventricle: The cavity size was normal. Systolic function was   normal. The estimated ejection fraction was in the range of 55% to 60%.    Wall motion was normal; there were no regional wall motion abnormalities. Impressions: - No cardiac source of emboli was indentified.  TCD with bubble 08/19/2017 pending  Bilateral Lower Extremity Venous Duplex 08/20/2017 There is no DVT or SVT noted in the bilateral lower extremities.   TEE 08/22/2017 pending    PHYSICAL EXAM Vitals:   08/19/17 2119 08/20/17 0052 08/20/17 0440 08/20/17 1022  BP: 124/80 107/60 (!) 104/58 121/73  Pulse: 100 82 74 93  Resp: 18 18 18 20   Temp: 97.7 F (36.5 C) 98.2 F (36.8 C) 97.8 F (36.6 C) 98.1 F (36.7 C)  TempSrc: Oral Oral Oral Oral  SpO2: 99% 97% 97% 99%  Weight:  Height:        Young lady not in distress. . Afebrile. Head is nontraumatic. Neck is supple without bruit.    Cardiac exam no murmur or gallop. Lungs are clear to auscultation. Distal pulses are well felt. Neurological Exam :  Awake alert oriented x 3 normal speech and language. Mild left lower face asymmetry. Tongue midline. No drift. Mild diminished fine finger movements on left. Orbits right over left upper extremity. Mild left grip  weak.. Normal sensation . Normal coordination. Gait not tested   ASSESSMENT/Maddox Krista Maddox is a 31 y.o. female with history of thrombotic thrombocytopenia purpura and pseudotumor cerebri who presents with left-sided weakness due to embolic right MCA branch and cerebral infarcts. She did not receive IV t-PA due to delay in presentation.   Stroke: Rt MCA multifocal and right cerebellar punctate infarcts, embolic pattern, source undetermined. Could be due to OCP use, current smoker and hx of migraines  Resultant  Mild left hand weakness  CT head normal  MRI head right frontal MCA multifocal and right cerebellar punctate embolic infarcts  CTA neck and brain no large vessel occlusion or stenosis  Bilateral LE venous duplex - no DVT or SVT noted in the bilateral lower extremities.   2D Echo - EF - 55% to 60%. No cardiac source of emboli identified.  TCD bubble study - pending  TEE pending  LDL 89  HgbA1c 4.8  UDS negative  Hypercoagulable workup pending  SCDs for VTE prophylaxis Diet Heart Room service appropriate? Yes; Fluid consistency: Thin Diet - low sodium heart healthy  No antiplatelets prior to admission, now on aspirin   Patient counseled to be compliant with her antithrombotic medications  Ongoing aggressive stroke risk factor management  Therapy recommendations:  Outpatient OT recommended. No follow-up PT.  Disposition:  Pending  History of TTP during pregnancy  Resolved  Platelet normal  CNS vasculitis unlikely  06/25/2015 CSF WBC 20, RBC 1, protein 43  01/19/2016 CSF WBC 1, RBC 0, protein 50  Autoimmune workup pending  Hyperlipidemia  Home meds: none  LDL 89, goal < 70  Now on Lipitor 10 mg daily  Continue statin at discharge  Tobacco abuse  Current smoker  Smoking cessation counseling provided  Pt is willing to quit  OCP use   mononessa PTA (estrogen and progesterone combination OCP)  Recommended to avoid use  Other  Stroke Risk Factors  Obesity, Body mass index is 33.28 kg/m., recommend weight loss, diet and exercise as appropriate   Possible obstructive sleep apnea - outpatient sleep study  Other active  Pseudotumor cerebri - on Topamax  Follows with Dr. Terrace Maddox at [facility] day # 3  Krista Plan, MD PhD Stroke Neurology 08/20/2017 3:16 PM   To contact Stroke Continuity provider, please refer to [email or web address]. After hours, contact General Neurology

## 2017-08-20 NOTE — Discharge Instructions (Signed)
Do not drive for at least 1 month or unless a neurologist clears you to drive before that.   Please take all your medications with you for your next visit with your Primary MD. Please request your Primary MD to go over all hospital test results at the follow up. Please ask your Primary MD to get all Hospital records sent to his/her office.  If you experience worsening of your admission symptoms, develop shortness of breath, chest pain, suicidal or homicidal thoughts or a life threatening emergency, you must seek medical attention immediately by calling 911 or calling your MD.  Bonita QuinYou must read the complete instructions/literature along with all the possible adverse reactions/side effects for all the medicines you take including new medications that have been prescribed to you. Take new medicines after you have completely understood and accpet all the possible adverse reactions/side effects.   Do not drive when taking pain medications or sedatives.    Do not take more than prescribed Pain, Sleep and Anxiety Medications  If you have smoked or chewed Tobacco in the last 2 yrs please stop. Stop any regular alcohol and or recreational drug use.  Wear Seat belts while driving.

## 2017-08-20 NOTE — Care Management Note (Signed)
Case Management Note  Patient Details  Name: Salomon FickHeather L Fakhouri MRN: [id number] Date of Birth: 04/18/1986  Subjective/Objective:                 Spoke w patient about resources, Good Rx, [facility] to est a PCP. Discussed cost of OP PT as patient would pay out of pocket. Patient declined. Noted PT signed off w/o rec for PT follow up after DC. Patient appreciative, per Michelle PiperKelli W. RN CM notes there are coupons on front of her chart that go home with her. No other CM needs at this time.    Action/Plan:  DC to home self care.  Expected Discharge Date:  08/20/17               Expected Discharge Plan:  Home/Self Care  In-House Referral:     Discharge planning Services  CM Consult  Post Acute Care Choice:    Choice offered to:     DME Arranged:    DME Agency:     HH Arranged:    HH Agency:     Status of Service:  Completed, signed off  If discussed at [employer] Length of Stay Meetings, dates discussed:    Additional Comments:  Lawerance SabalDebbie Loye Vento, RN 08/20/2017, 11:10 AM

## 2017-08-20 NOTE — Discharge Summary (Addendum)
Physician Discharge Summary  Krista Maddox ZOX:[id number] DOB: [birth year] DOA: 08/16/2017  PCP: Patient, No Pcp Per  Admit date: 08/16/2017 Discharge date: 08/20/2017  Admitted From: home Disposition:  home   Recommendations for Outpatient Follow-up:  1. Neuro to f/u on vasculitis tests which were ordered by them 2. She has been smoking and taking oral contraceptives- her stroke was likely due to this and I have discussed it with her in detail.  Discharge Condition:  stable   CODE STATUS:  Full code   Consultations:  Neurology  Cardiology     Discharge Diagnoses:  Principal Problem:   Cerebral embolism with cerebral infarction Active Problems:   Pseudotumor cerebri   Chronic migraine   Numbness on left side    Subjective: Still has numbness in left face and left hand.   Brief Summary: Krista CritchleyHeather L Matthewsis a 31 y.o.femalewith medical history significant of TTP (while pregnant), IIH, migraines. Patient [facility] at [facility] with c/o new onset L sided numbness. Found to have multiple strokes on MRI. Denies using injection drugs. FAmily Hx of CVAs in great Grandparents but no blood issues known.  Hospital Course:  Multiple embolic appearing CVAs - in setting of smoking and OCP -MRI Small acute infarctions in the right cerebellum. Several small acute infarctions in the right posterior frontal region - 2 D ECHO: Left ventricle: The cavity size was normal. Systolic function was normal. The estimated ejection fraction was in the range of 55% to 60%. Wall motion was normal; there were no regional wall motion abnormalities. -CTA : patent arteries -FLP: LDL 89 -HgbA1C: 4.8 - transcranial doppler result> trivial PFO size  - LE Venous duplex is negative -have started Lipitor and ASA 81 mg - will need TEE on Monday as outpt- cardiology has seen her in the hospital and OK'd this - advised to quit smoking  Incidental cervical lymphadenopathy outpatient  follow up -denies recent sore throat/URI  Mildly elevated troponin -flat trend  H/o psuedotumor cerebri -follow with Dr. Terrace ArabiaYan  Tobacco abuse - advised to quit  Discharge Instructions  Discharge Instructions    Diet - low sodium heart healthy    Complete by:  As directed    Increase activity slowly    Complete by:  As directed      Allergies as of 08/20/2017   No Known Allergies     Medication List    TAKE these medications   aspirin 81 MG chewable tablet Chew 1 tablet (81 mg total) by mouth daily.   atorvastatin 10 MG tablet Commonly known as:  LIPITOR Take 1 tablet (10 mg total) by mouth daily at 6 PM.   butalbital-acetaminophen-caffeine 50-325-40 MG tablet Commonly known as:  FIORICET, ESGIC Take 1 tablet by mouth every 6 (six) hours as needed for headache.   fluticasone 50 MCG/ACT nasal spray Commonly known as:  FLONASE Place 2 sprays into both nostrils daily.   gabapentin 300 MG capsule Commonly known as:  NEURONTIN Take 1 capsule (300 mg total) by mouth 3 (three) times daily. What changed:  when to take this   Magnesium Oxide 400 (240 Mg) MG Tabs Take 400 mg by mouth 2 (two) times daily.   meloxicam 15 MG tablet Commonly known as:  MOBIC Take 1 tablet (15 mg total) by mouth daily. Please call [phone] to scheduled appt.   MONONESSA 0.25-35 MG-MCG tablet Generic drug:  norgestimate-ethinyl estradiol Take 1 tablet by mouth daily.   nortriptyline 50 MG capsule Commonly known as:  PAMELOR Take 1 capsule (50 mg total) by mouth at bedtime.   Riboflavin 100 MG Tabs Take 1 tablet (100 mg total) by mouth 2 (two) times daily.   rizatriptan 10 MG disintegrating tablet Commonly known as:  MAXALT-MLT Take 1 tablet (10 mg total) by mouth as needed. May repeat in 2 hours if needed   topiramate 100 MG tablet Commonly known as:  TOPAMAX TAKE ONE AND ONE-HALF TABLETS BY MOUTH TWICE DAILY What changed:  See the new instructions.            Discharge  Care Instructions        Start     Ordered   08/20/17 0000  aspirin 81 MG chewable tablet  Daily     08/19/17 1523   08/19/17 0000  atorvastatin (LIPITOR) 10 MG tablet  Daily-1800     08/19/17 1523   08/19/17 0000  Increase activity slowly     08/19/17 1523   08/19/17 0000  Diet - low sodium heart healthy     08/19/17 1523     Follow-up Information    [facility] Follow up on 09/06/2017.   Why:  Your appointment time is 1 pm. Please arrive 15 min early and bring a picture ID and your current medications. Contact information: [address] [place] [address]         No Known Allergies   Procedures/Studies: 2 D ECHO Carotid and LE venous duplex  Ct Angio Head W Or Wo Contrast  Result Date: 08/17/2017 CLINICAL DATA:  31 y/o F; dizziness and left-sided numbness. History of TTP. EXAM: CT ANGIOGRAPHY HEAD AND NECK TECHNIQUE: Multidetector CT imaging of the head and neck was performed using the standard protocol during bolus administration of intravenous contrast. Multiplanar CT image reconstructions and MIPs were obtained to evaluate the vascular anatomy. Carotid stenosis measurements (when applicable) are obtained utilizing NASCET criteria, using the distal internal carotid diameter as the denominator. CONTRAST:  50 cc Isovue 370 COMPARISON:  08/16/2017 CT head FINDINGS: CTA NECK FINDINGS Aortic arch: Bovine variant branching. Imaged portion shows no evidence of aneurysm or dissection. No significant stenosis of the major arch vessel origins. Right carotid system: No evidence of dissection, stenosis (50% or greater) or occlusion. Left carotid system: No evidence of dissection, stenosis (50% or greater) or occlusion. Vertebral arteries: Left dominant. No evidence of dissection, stenosis (50% or greater) or occlusion. Skeleton: Negative. Other neck: Bilateral level II adenopathy measuring up to 12 x 18 mm on the left (series 5: Image 99). Upper chest:  Negative. Review of the MIP images confirms the above findings CTA HEAD FINDINGS Anterior circulation: No significant stenosis, proximal occlusion, aneurysm, or vascular malformation. Posterior circulation: No significant stenosis, proximal occlusion, aneurysm, or vascular malformation. Venous sinuses: As permitted by contrast timing, patent. Anatomic variants: No anterior communicating artery. Large bilateral posterior communicating arteries. Delayed phase: No abnormal intracranial enhancement. Review of the MIP images confirms the above findings IMPRESSION: 1. Patent carotid and vertebral arteries. No dissection, aneurysm, or hemodynamically significant stenosis utilizing NASCET criteria. 2. Patent circle of Willis. No large vessel occlusion, aneurysm, or significant stenosis. 3. No abnormal enhancement of the brain. 4. Bilateral upper anterior cervical lymphadenopathy is nonspecific, possibly reactive to underlying pharyngitis or upper respiratory infection. Differential includes systemic infectious/inflammatory processes and lymphoproliferative disease. Electronically Signed   By: Mitzi Hansen M.D.   On: 08/17/2017 06:50   Ct Head Wo Contrast  Result Date: 08/16/2017 CLINICAL DATA:  Headache  and LEFT-sided numbness yesterday, intermittent similar symptoms today. History of TTP. EXAM: CT HEAD WITHOUT CONTRAST TECHNIQUE: Contiguous axial images were obtained from the base of the skull through the vertex without intravenous contrast. COMPARISON:  MRI of the head June 11, 2015 FINDINGS: BRAIN: No intraparenchymal hemorrhage, mass effect nor midline shift. The ventricles and sulci are normal. No acute large vascular territory infarcts. No abnormal extra-axial fluid collections. Basal cisterns are patent. VASCULAR: Unremarkable. SKULL/SOFT TISSUES: No skull fracture. No significant soft tissue swelling. ORBITS/SINUSES: The included ocular globes and orbital contents are normal.The mastoid aircells and  included paranasal sinuses are well-aerated. OTHER: None. IMPRESSION: Normal noncontrast CT HEAD. Electronically Signed   By: Awilda Metro M.D.   On: 08/16/2017 18:49   Ct Angio Neck W Or Wo Contrast  Result Date: 08/17/2017 CLINICAL DATA:  31 y/o F; dizziness and left-sided numbness. History of TTP. EXAM: CT ANGIOGRAPHY HEAD AND NECK TECHNIQUE: Multidetector CT imaging of the head and neck was performed using the standard protocol during bolus administration of intravenous contrast. Multiplanar CT image reconstructions and MIPs were obtained to evaluate the vascular anatomy. Carotid stenosis measurements (when applicable) are obtained utilizing NASCET criteria, using the distal internal carotid diameter as the denominator. CONTRAST:  50 cc Isovue 370 COMPARISON:  08/16/2017 CT head FINDINGS: CTA NECK FINDINGS Aortic arch: Bovine variant branching. Imaged portion shows no evidence of aneurysm or dissection. No significant stenosis of the major arch vessel origins. Right carotid system: No evidence of dissection, stenosis (50% or greater) or occlusion. Left carotid system: No evidence of dissection, stenosis (50% or greater) or occlusion. Vertebral arteries: Left dominant. No evidence of dissection, stenosis (50% or greater) or occlusion. Skeleton: Negative. Other neck: Bilateral level II adenopathy measuring up to 12 x 18 mm on the left (series 5: Image 99). Upper chest: Negative. Review of the MIP images confirms the above findings CTA HEAD FINDINGS Anterior circulation: No significant stenosis, proximal occlusion, aneurysm, or vascular malformation. Posterior circulation: No significant stenosis, proximal occlusion, aneurysm, or vascular malformation. Venous sinuses: As permitted by contrast timing, patent. Anatomic variants: No anterior communicating artery. Large bilateral posterior communicating arteries. Delayed phase: No abnormal intracranial enhancement. Review of the MIP images confirms the above  findings IMPRESSION: 1. Patent carotid and vertebral arteries. No dissection, aneurysm, or hemodynamically significant stenosis utilizing NASCET criteria. 2. Patent circle of Willis. No large vessel occlusion, aneurysm, or significant stenosis. 3. No abnormal enhancement of the brain. 4. Bilateral upper anterior cervical lymphadenopathy is nonspecific, possibly reactive to underlying pharyngitis or upper respiratory infection. Differential includes systemic infectious/inflammatory processes and lymphoproliferative disease. Electronically Signed   By: Mitzi Hansen M.D.   On: 08/17/2017 06:50   Mr Laqueta Jean ZO Contrast  Result Date: 08/17/2017 CLINICAL DATA:  History of pseudotumor. Left-sided weakness beginning several days ago. Sudden onset of dizziness. History of TTP. EXAM: MRI HEAD WITHOUT AND WITH CONTRAST TECHNIQUE: Multiplanar, multiecho pulse sequences of the brain and surrounding structures were obtained without and with intravenous contrast. CONTRAST:  15mL MULTIHANCE GADOBENATE DIMEGLUMINE 529 MG/ML IV SOLN COMPARISON:  CT studies same day.  MRI 06/11/2015. FINDINGS: Brain: There are scattered acute embolic infarctions within multiple territories consistent with micro emboli from the heart or ascending aorta. There are a few small subcentimeter acute infarctions in the right cerebellum. Left cerebellum is normal. Brainstem is normal. Left cerebral hemisphere is normal. There are scattered acute infarctions within the right posterior frontal region consistent with embolic infarctions within right MCA branch vessels. No large  confluent infarction. No mass effect or hemorrhage. No shift. No evidence of old insults. No hydrocephalus. No extra-axial collection. No abnormal contrast enhancement. Vascular: Major vessels at the base of the brain show flow. Skull and upper cervical spine: Negative Sinuses/Orbits: Clear/normal Other: None IMPRESSION: Small acute infarctions in the right cerebellum.  Several small acute infarctions in the right posterior frontal region. No large confluent infarction, hemorrhage or mass effect. Small acute infarctions in multiple vascular territories suggests embolic disease from the heart or ascending aorta. Electronically Signed   By: Paulina Fusi M.D.   On: 08/17/2017 12:12        Discharge Exam: Vitals:   08/20/17 0440 08/20/17 1022  BP: (!) 104/58 121/73  Pulse: 74 93  Resp: 18 20  Temp: 97.8 F (36.6 C) 98.1 F (36.7 C)  SpO2: 97% 99%   Vitals:   08/19/17 2119 08/20/17 0052 08/20/17 0440 08/20/17 1022  BP: 124/80 107/60 (!) 104/58 121/73  Pulse: 100 82 74 93  Resp: Temp: 97.7 F (36.5 C) 98.2 F (36.8 C) 97.8 F (36.6 C) 98.1 F (36.7 C)  TempSrc: Oral Oral Oral Oral  SpO2: 99% 97% 97% 99%  Weight:      Height:        General: Pt is alert, awake, not in acute distress Cardiovascular: RRR, S1/S2 +, no rubs, no gallops Respiratory: CTA bilaterally, no wheezing, no rhonchi Abdominal: Soft, NT, ND, bowel sounds + Extremities: no edema, no cyanosis    The results of significant diagnostics from this hospitalization (including imaging, microbiology, ancillary and laboratory) are listed below for reference.     Microbiology: No results found for this or any previous visit (from the past 240 hour(s)).   Labs: BNP (last 3 results) No results for input(s): BNP in the last 8760 hours. Basic Metabolic Panel:  Recent Labs Lab 08/16/17 1810 08/20/17 0353  NA 138 138  K 3.7 3.6  CL 109 108  CO2 24 23  GLUCOSE 95 99  BUN 13 15  CREATININE 0.79 0.94  CALCIUM 8.7* 8.9   Liver Function Tests:  Recent Labs Lab 08/16/17 1810  AST 15  ALT 7*  ALKPHOS 54  BILITOT 0.3  PROT 7.1  ALBUMIN 3.4*   No results for input(s): LIPASE, AMYLASE in the last 168 hours. No results for input(s): AMMONIA in the last 168 hours. CBC:  Recent Labs Lab 08/16/17 1810 08/20/17 0353  WBC 9.3 8.1  NEUTROABS 5.6  --   HGB  13.0 13.7  HCT 39.5 42.8  MCV 93.4 92.8  PLT 218 212   Cardiac Enzymes:  Recent Labs Lab 08/16/17 1810 08/16/17 2240  TROPONINI 0.12* 0.11*   BNP: Invalid input(s): POCBNP CBG:  Recent Labs Lab 08/16/17 1841  GLUCAP 92   D-Dimer No results for input(s): DDIMER in the last 72 hours. Hgb A1c  Recent Labs  08/18/17 0546  HGBA1C 4.8   Lipid Profile  Recent Labs  08/18/17 0546  CHOL 172  HDL 35*  LDLCALC 89  TRIG 161*  CHOLHDL 4.9   Thyroid function studies No results for input(s): TSH, T4TOTAL, T3FREE, THYROIDAB in the last 72 hours.  Invalid input(s): FREET3 Anemia work up No results for input(s): VITAMINB12, FOLATE, FERRITIN, TIBC, IRON, RETICCTPCT in the last 72 hours. Urinalysis    Component Value Date/Time   COLORURINE YELLOW 08/16/2017 1815   APPEARANCEUR CLOUDY (A) 08/16/2017 1815   LABSPEC 1.020 08/16/2017 1815   PHURINE 7.0 08/16/2017 1815  GLUCOSEU NEGATIVE 08/16/2017 1815   HGBUR NEGATIVE 08/16/2017 1815   HGBUR negative 11/24/2009 0847   BILIRUBINUR NEGATIVE 08/16/2017 1815   KETONESUR NEGATIVE 08/16/2017 1815   PROTEINUR NEGATIVE 08/16/2017 1815   UROBILINOGEN 0.2 03/13/2012 0023   NITRITE NEGATIVE 08/16/2017 1815   LEUKOCYTESUR NEGATIVE 08/16/2017 1815   Sepsis Labs Invalid input(s): PROCALCITONIN,  WBC,  LACTICIDVEN Microbiology No results found for this or any previous visit (from the past 240 hour(s)).   Time coordinating discharge: Over 30 minutes  SIGNED:   Calvert Cantor, MD  [facility] 08/20/2017, 10:41 AM Pager   If 7PM-7AM, please contact night-coverage [email or web address] Password [email or web address]

## 2017-08-21 LAB — RHEUMATOID FACTOR

## 2017-08-22 ENCOUNTER — Encounter (HOSPITAL_COMMUNITY)
Admission: RE | Disposition: A | Discharge status: Home / Self Care | Payer: Self-pay | Source: Ambulatory Visit | Attending: Cardiovascular Disease

## 2017-08-22 ENCOUNTER — Ambulatory Visit (HOSPITAL_BASED_OUTPATIENT_CLINIC_OR_DEPARTMENT_OTHER)
Admission: RE | Admit: 2017-08-22 | Discharge: 2017-08-22 | Disposition: A | Discharge status: Home / Self Care | Payer: Self-pay | Source: Ambulatory Visit | Attending: Physician Assistant | Admitting: Physician Assistant

## 2017-08-22 ENCOUNTER — Ambulatory Visit (HOSPITAL_COMMUNITY): Payer: Self-pay | Admitting: Certified Registered"

## 2017-08-22 ENCOUNTER — Encounter (HOSPITAL_COMMUNITY): Payer: Self-pay | Admitting: Emergency Medicine

## 2017-08-22 ENCOUNTER — Ambulatory Visit (HOSPITAL_COMMUNITY)
Admission: RE | Admit: 2017-08-22 | Discharge: 2017-08-22 | Disposition: A | Discharge status: Home / Self Care | Payer: Self-pay | Source: Ambulatory Visit | Attending: Cardiovascular Disease | Admitting: Cardiovascular Disease

## 2017-08-22 DIAGNOSIS — F17210 Nicotine dependence, cigarettes, uncomplicated: Secondary | ICD-10-CM | POA: Insufficient documentation

## 2017-08-22 DIAGNOSIS — I639 Cerebral infarction, unspecified: Secondary | ICD-10-CM | POA: Insufficient documentation

## 2017-08-22 DIAGNOSIS — Z793 Long term (current) use of hormonal contraceptives: Secondary | ICD-10-CM | POA: Insufficient documentation

## 2017-08-22 DIAGNOSIS — Z79899 Other long term (current) drug therapy: Secondary | ICD-10-CM | POA: Insufficient documentation

## 2017-08-22 DIAGNOSIS — Z7982 Long term (current) use of aspirin: Secondary | ICD-10-CM | POA: Insufficient documentation

## 2017-08-22 DIAGNOSIS — I63329 Cerebral infarction due to thrombosis of unspecified anterior cerebral artery: Secondary | ICD-10-CM

## 2017-08-22 DIAGNOSIS — I638 Other cerebral infarction: Secondary | ICD-10-CM

## 2017-08-22 HISTORY — PX: TEE WITHOUT CARDIOVERSION: SHX5443

## 2017-08-22 LAB — MPO/PR-3 (ANCA) ANTIBODIES
ANCA Proteinase 3: <3.5 U/mL (ref 0.0–3.5)
Myeloperoxidase Abs: <9 U/mL (ref 0.0–9.0)

## 2017-08-22 LAB — ANCA TITERS: C-ANCA: <1:20 {titer}

## 2017-08-22 LAB — ANTI-DNA ANTIBODY, DOUBLE-STRANDED: DS DNA AB: 1 [IU]/mL (ref 0–9)

## 2017-08-22 LAB — HOMOCYSTEINE: Homocysteine: 12.6 umol/L (ref 0.0–15.0)

## 2017-08-22 LAB — CARDIOLIPIN ANTIBODIES, IGG, IGM, IGA: Anticardiolipin IgM: <9 MPL U/mL (ref 0–12)

## 2017-08-22 LAB — SJOGRENS SYNDROME-A EXTRACTABLE NUCLEAR ANTIBODY: SSA (Ro) (ENA) Antibody, IgG: <0.2 AI (ref 0.0–0.9)

## 2017-08-22 LAB — SJOGRENS SYNDROME-B EXTRACTABLE NUCLEAR ANTIBODY

## 2017-08-22 SURGERY — ECHOCARDIOGRAM, TRANSESOPHAGEAL
Anesthesia: Monitor Anesthesia Care

## 2017-08-22 MED ORDER — LACTATED RINGERS IV SOLN
INTRAVENOUS | Status: DC | PRN
  Administered 2017-08-22: 12:00:00 via INTRAVENOUS

## 2017-08-22 MED ORDER — PROPOFOL 10 MG/ML IV BOLUS
INTRAVENOUS | Status: DC | PRN
  Administered 2017-08-22 (×2): 10 mg via INTRAVENOUS

## 2017-08-22 MED ORDER — SODIUM CHLORIDE 0.9 % IV SOLN: INTRAVENOUS | Status: DC

## 2017-08-22 MED ORDER — LACTATED RINGERS IV SOLN
INTRAVENOUS | Status: DC
  Administered 2017-08-22: 12:00:00 via INTRAVENOUS

## 2017-08-22 MED ORDER — PROPOFOL 500 MG/50ML IV EMUL
INTRAVENOUS | Status: DC | PRN
  Administered 2017-08-22: 100 ug/kg/min via INTRAVENOUS

## 2017-08-22 NOTE — Anesthesia Postprocedure Evaluation (Signed)
Anesthesia Post Note  Patient: Krista Maddox  Procedure(s) Performed: Procedure(s) (LRB): TRANSESOPHAGEAL ECHOCARDIOGRAM (TEE) (N/A)     Patient location during evaluation: PACU Anesthesia Type: MAC Level of consciousness: awake and alert Pain management: pain level controlled Vital Signs Assessment: post-procedure vital signs reviewed and stable Respiratory status: spontaneous breathing, nonlabored ventilation, respiratory function stable and patient connected to nasal cannula oxygen Cardiovascular status: stable and blood pressure returned to baseline Anesthetic complications: no    Last Vitals:  Vitals:   08/22/17 1500 08/22/17 1510  BP: 102/69 119/75  Pulse: 84 71  Resp: 19 18  Temp:    SpO2: 100% 100%    Last Pain:  Vitals:   08/22/17 1449  TempSrc: Oral                 Effie Berkshire

## 2017-08-22 NOTE — Anesthesia Procedure Notes (Signed)
Procedure Name: MAC Date/Time: 08/22/2017 2:23 PM Performed by: Garrison Columbus T Pre-anesthesia Checklist: Patient identified, Emergency Drugs available, Suction available and Patient being monitored Patient Re-evaluated:Patient Re-evaluated prior to induction Oxygen Delivery Method: Nasal cannula Preoxygenation: Pre-oxygenation with 100% oxygen Induction Type: IV induction Placement Confirmation: positive ETCO2 and breath sounds checked- equal and bilateral Dental Injury: Teeth and Oropharynx as per pre-operative assessment

## 2017-08-22 NOTE — Discharge Instructions (Signed)
Monitored Anesthesia Care, Care After These instructions provide you with information about caring for yourself after your procedure. Your health care provider may also give you more specific instructions. Your treatment has been planned according to current medical practices, but problems sometimes occur. Call your health care provider if you have any problems or questions after your procedure. What can I expect after the procedure? After your procedure, it is common to:  Feel sleepy for several hours.  Feel clumsy and have poor balance for several hours.  Feel forgetful about what happened after the procedure.  Have poor judgment for several hours.  Feel nauseous or vomit.  Have a sore throat if you had a breathing tube during the procedure.  Follow these instructions at home: For at least 24 hours after the procedure:   Do not: ? Participate in activities in which you could fall or become injured. ? Drive. ? Use heavy machinery. ? Drink alcohol. ? Take sleeping pills or medicines that cause drowsiness. ? Make important decisions or sign legal documents. ? Take care of children on your own.  Rest. Eating and drinking  Follow the diet that is recommended by your health care provider.  If you vomit, drink water, juice, or soup when you can drink without vomiting.  Make sure you have little or no nausea before eating solid foods. General instructions  Have a responsible adult stay with you until you are awake and alert.  Take over-the-counter and prescription medicines only as told by your health care provider.  If you smoke, do not smoke without supervision.  Keep all follow-up visits as told by your health care provider. This is important. Contact a health care provider if:  You keep feeling nauseous or you keep vomiting.  You feel light-headed.  You develop a rash.  You have a fever. Get help right away if:  You have trouble breathing. This information is  not intended to replace advice given to you by your health care provider. Make sure you discuss any questions you have with your health care provider. Document Released: 03/21/2016 Document Revised: 07/21/2016 Document Reviewed: 03/21/2016 Elsevier Interactive Patient Education  2018 [employer]    TEE  YOU HAD AN CARDIAC PROCEDURE TODAY: Refer to the procedure report and other information in the discharge instructions given to you for any specific questions about what was found during the examination. If this information does not answer your questions, please call [facility] office at [phone] to clarify.   DIET: Your first meal following the procedure should be a light meal and then it is ok to progress to your normal diet. A half-sandwich or bowl of soup is an example of a good first meal. Heavy or fried foods are harder to digest and may make you feel nauseous or bloated. Drink plenty of fluids but you should avoid alcoholic beverages for 24 hours. If you had a esophageal dilation, please see attached instructions for diet.   ACTIVITY: Your care partner should take you home directly after the procedure. You should plan to take it easy, moving slowly for the rest of the day. You can resume normal activity the day after the procedure however YOU SHOULD NOT DRIVE, use power tools, machinery or perform tasks that involve climbing or major physical exertion for 24 hours (because of the sedation medicines used during the test).   SYMPTOMS TO REPORT IMMEDIATELY: A cardiologist can be reached at any hour. Please call [phone] for any of the following symptoms:  Vomiting of blood or coffee ground material  New, significant abdominal pain  New, significant chest pain or pain under the shoulder blades  Painful or persistently difficult swallowing  New shortness of breath  Black, tarry-looking or red, bloody stools  FOLLOW UP:  Please also call with any specific questions about  appointments or follow up tests.

## 2017-08-22 NOTE — H&P (View-Only) (Signed)
Cardiology Consultation:   Patient ID: Krista Maddox; [id number]; 04/07/1986   Admit date: 08/16/2017 Date of Consult: 08/19/2017  Primary Care Provider: Patient, No Pcp Per Primary Cardiologist: new - Dr. Anne FuSkains Primary Electrophysiologist:     Patient Profile:   Krista FickHeather L Maddox is a 31 y.o. female with a hx of TTP, pseudotumor cerebri, chronic migraine, and is current smoker who is being seen today for the evaluation of TEE following stroke at the request of Dr. Benjamine MolaVann.  History of Present Illness:   [name]sided numbness. 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 She has a history of smoking and is on oral contraception.   LDL is 89 and A1c is 4.8. She was started on ASA and statin. Cardiology was consulted to perform TEE in endoscopy to rule out a cardiac source of emboli; however, scheduling cannot accommodate her until Monday. She may discharge over the weekend and [facility]. She was instructed to be NPO at [facility] Sunday night.   On our interview, she has residual numbness and weakness in her left hand, no issues ambulating in halls. She denies palpitations and previous cardiac history.     Past Medical History:  Diagnosis Date  . DDD (degenerative disc disease), lumbar   . Headache   . Other disorders of optic nerve, not elsewhere classified, unspecified eye   . TTP (thrombotic thrombocytopenic purpura) (HCC)     Past Surgical History:  Procedure Laterality Date  . CESAREAN SECTION    . CHOLECYSTECTOMY    . WISDOM TOOTH EXTRACTION       Home Medications:  Prior to Admission medications   Medication Sig Start Date End Date Taking? Authorizing Provider  gabapentin (NEURONTIN) 300 MG capsule Take 1 capsule (300 mg total) by mouth 3 (three) times daily. Patient taking differently: Take 300 mg by mouth 2  (two) times daily.  10/13/16  Yes Levert FeinsteinYan, Yijun, MD  Magnesium Oxide 400 (240 Mg) MG TABS Take 400 mg by mouth 2 (two) times daily. 06/01/16  Yes Levert FeinsteinYan, Yijun, MD  meloxicam (MOBIC) 15 MG tablet Take 1 tablet (15 mg total) by mouth daily. Please call [phone] to scheduled appt. 05/18/17  Yes Levert FeinsteinYan, Yijun, MD  norgestimate-ethinyl estradiol (MONONESSA) 0.25-35 MG-MCG tablet Take 1 tablet by mouth daily.   Yes [provider]  nortriptyline (PAMELOR) 50 MG capsule Take 1 capsule (50 mg total) by mouth at bedtime. 05/18/17  Yes Levert FeinsteinYan, Yijun, MD  Riboflavin 100 MG TABS Take 1 tablet (100 mg total) by mouth 2 (two) times daily. 06/01/16  Yes Levert FeinsteinYan, Yijun, MD  rizatriptan (MAXALT-MLT) 10 MG disintegrating tablet Take 1 tablet (10 mg total) by mouth as needed. May repeat in 2 hours if needed 01/05/16  Yes Levert FeinsteinYan, Yijun, MD  topiramate (TOPAMAX) 100 MG tablet TAKE ONE AND ONE-HALF TABLETS BY MOUTH TWICE DAILY Patient taking differently: TAKE 150MG  BY MOUTH TWICE DAILY 07/26/16  Yes Levert FeinsteinYan, Yijun, MD  aspirin 81 MG chewable tablet Chew 1 tablet (81 mg total) by mouth daily. 08/20/17   Calvert Cantorizwan, Saima, MD  atorvastatin (LIPITOR) 10 MG tablet Take 1 tablet (10 mg total) by mouth daily at 6 PM. 08/19/17   Calvert Cantorizwan, Saima, MD  butalbital-acetaminophen-caffeine (FIORICET, ESGIC) 50-325-40 MG per tablet Take 1 tablet by mouth every 6 (six) hours as needed for headache. Patient not taking: Reported on 08/17/2017 06/26/15  Levert Feinstein, MD  fluticasone [facility]) 50 MCG/ACT nasal spray Place 2 sprays into both nostrils daily. Patient not taking: Reported on 08/17/2017 01/13/17 01/23/17  Benay Pike, NP    Inpatient Medications: Scheduled Meds: . aspirin  81 mg Oral Daily  . atorvastatin  10 mg Oral q1800  . gabapentin  300 mg Oral TID  . heparin  5,000 Units Subcutaneous Q8H  . magnesium oxide  400 mg Oral BID  . meloxicam  15 mg Oral Q breakfast  . nortriptyline  50 mg Oral QHS  . topiramate  150 mg Oral BID   Continuous  Infusions:  PRN Meds: acetaminophen **OR** acetaminophen, ibuprofen, ondansetron **OR** ondansetron (ZOFRAN) IV  Allergies:   No Known Allergies  Social History:   Social History   Social History  . Marital status: Single    Spouse name: N/A  . Number of children: 1  . Years of education: In college   Occupational History  . Dispatcher    Social History Main Topics  . Smoking status: Current Every Day Smoker    Packs/day: 0.50  . Smokeless tobacco: Never Used  . Alcohol use No  . Drug use: No  . Sexual activity: Yes    Birth control/ protection: Pill   Other Topics Concern  . Not on file   Social History Narrative   Lives at home with her mother and daughter.   3-4 cups caffeine daily.   Left-handed.    Family History:    Family History  Problem Relation Age of Onset  . Diabetes Mother   . Hypertension Mother   . Ovarian cancer Mother        Hysterectomy  . Healthy Father   . Ovarian cancer Maternal Grandmother        Hysterectomy     ROS:  Please see the history of present illness.  ROS  All other ROS reviewed and negative.     Physical Exam/Data:   Vitals:   08/18/17 1814 08/18/17 2100 08/19/17 0100 08/19/17 0507  BP: 127/69 120/62 113/63 102/67  Pulse: 79 75 73 80  Resp: Temp: 98.5 F (36.9 C) 98.1 F (36.7 C) 98.2 F (36.8 C) 97.7 F (36.5 C)  TempSrc: Oral Oral Oral Oral  SpO2: 100% 99% 98% 98%  Weight:      Height:       No intake or output data in the 24 hours ending 08/19/17 1537 Filed Weights   08/16/17 1742  Weight: 200 lb (90.7 kg)   Body mass index is 33.28 kg/m.  General:  Well nourished, well developed, in no acute distress HEENT: normal Lymph: no adenopathy Neck: no JVD Vascular: No carotid bruits; FA pulses 2+ bilaterally without bruits  Cardiac:  normal S1, S2; RRR; no murmur  Lungs:  clear to auscultation bilaterally, no wheezing, rhonchi or rales  Abd: soft, nontender, no hepatomegaly  Ext: no  edema Skin: warm and dry  Neuro:  CNs 2-12 intact, left hand weakness Psych:  Normal affect   EKG:  The EKG was personally reviewed and demonstrates:  NSR Telemetry:  Telemetry was personally reviewed and demonstrates:  NSR  Relevant CV Studies:  2D Echocardiogram Study Conclusions - Left ventricle: The cavity size was normal. Systolic function was   normal. The estimated ejection fraction was in the range of 55%   to 60%. Wall motion was normal; there were no regional wall   motion abnormalities.  Impressions: - No cardiac source  of emboli was indentified.   Laboratory Data:  Chemistry Recent Labs Lab 08/16/17 1810  NA 138  K 3.7  CL 109  CO2 24  GLUCOSE 95  BUN 13  CREATININE 0.79  CALCIUM 8.7*  GFRNONAA >60  GFRAA >60  ANIONGAP 5     Recent Labs Lab 08/16/17 1810  PROT 7.1  ALBUMIN 3.4*  AST 15  ALT 7*  ALKPHOS 54  BILITOT 0.3   Hematology Recent Labs Lab 08/16/17 1810  WBC 9.3  RBC 4.23  HGB 13.0  HCT 39.5  MCV 93.4  MCH 30.7  MCHC 32.9  RDW 13.4  PLT 218   Cardiac Enzymes Recent Labs Lab 08/16/17 1810 08/16/17 2240  TROPONINI 0.12* 0.11*   No results for input(s): TROPIPOC in the last 168 hours.  BNPNo results for input(s): BNP, PROBNP in the last 168 hours.  DDimer No results for input(s): DDIMER in the last 168 hours.  Radiology/Studies:  Ct Angio Head W Or Wo Contrast  Result Date: 08/17/2017 CLINICAL DATA:  31 y/o F; dizziness and left-sided numbness. History of TTP. EXAM: CT ANGIOGRAPHY HEAD AND NECK TECHNIQUE: Multidetector CT imaging of the head and neck was performed using the standard protocol during bolus administration of intravenous contrast. Multiplanar CT image reconstructions and MIPs were obtained to evaluate the vascular anatomy. Carotid stenosis measurements (when applicable) are obtained utilizing NASCET criteria, using the distal internal carotid diameter as the denominator. CONTRAST:  50 cc Isovue 370 COMPARISON:   08/16/2017 CT head FINDINGS: CTA NECK FINDINGS Aortic arch: Bovine variant branching. Imaged portion shows no evidence of aneurysm or dissection. No significant stenosis of the major arch vessel origins. Right carotid system: No evidence of dissection, stenosis (50% or greater) or occlusion. Left carotid system: No evidence of dissection, stenosis (50% or greater) or occlusion. Vertebral arteries: Left dominant. No evidence of dissection, stenosis (50% or greater) or occlusion. Skeleton: Negative. Other neck: Bilateral level II adenopathy measuring up to 12 x 18 mm on the left (series 5: Image 99). Upper chest: Negative. Review of the MIP images confirms the above findings CTA HEAD FINDINGS Anterior circulation: No significant stenosis, proximal occlusion, aneurysm, or vascular malformation. Posterior circulation: No significant stenosis, proximal occlusion, aneurysm, or vascular malformation. Venous sinuses: As permitted by contrast timing, patent. Anatomic variants: No anterior communicating artery. Large bilateral posterior communicating arteries. Delayed phase: No abnormal intracranial enhancement. Review of the MIP images confirms the above findings IMPRESSION: 1. Patent carotid and vertebral arteries. No dissection, aneurysm, or hemodynamically significant stenosis utilizing NASCET criteria. 2. Patent circle of Willis. No large vessel occlusion, aneurysm, or significant stenosis. 3. No abnormal enhancement of the brain. 4. Bilateral upper anterior cervical lymphadenopathy is nonspecific, possibly reactive to underlying pharyngitis or upper respiratory infection. Differential includes systemic infectious/inflammatory processes and lymphoproliferative disease. Electronically Signed   By: Mitzi Hansen M.D.   On: 08/17/2017 06:50   Ct Head Wo Contrast  Result Date: 08/16/2017 CLINICAL DATA:  Headache and LEFT-sided numbness yesterday, intermittent similar symptoms today. History of TTP. EXAM: CT  HEAD WITHOUT CONTRAST TECHNIQUE: Contiguous axial images were obtained from the base of the skull through the vertex without intravenous contrast. COMPARISON:  MRI of the head June 11, 2015 FINDINGS: BRAIN: No intraparenchymal hemorrhage, mass effect nor midline shift. The ventricles and sulci are normal. No acute large vascular territory infarcts. No abnormal extra-axial fluid collections. Basal cisterns are patent. VASCULAR: Unremarkable. SKULL/SOFT TISSUES: No skull fracture. No significant soft tissue swelling. ORBITS/SINUSES: The included ocular  globes and orbital contents are normal.The mastoid aircells and included paranasal sinuses are well-aerated. OTHER: None. IMPRESSION: Normal noncontrast CT HEAD. Electronically Signed   By: Awilda Metro M.D.   On: 08/16/2017 18:49   Ct Angio Neck W Or Wo Contrast  Result Date: 08/17/2017 CLINICAL DATA:  31 y/o F; dizziness and left-sided numbness. History of TTP. EXAM: CT ANGIOGRAPHY HEAD AND NECK TECHNIQUE: Multidetector CT imaging of the head and neck was performed using the standard protocol during bolus administration of intravenous contrast. Multiplanar CT image reconstructions and MIPs were obtained to evaluate the vascular anatomy. Carotid stenosis measurements (when applicable) are obtained utilizing NASCET criteria, using the distal internal carotid diameter as the denominator. CONTRAST:  50 cc Isovue 370 COMPARISON:  08/16/2017 CT head FINDINGS: CTA NECK FINDINGS Aortic arch: Bovine variant branching. Imaged portion shows no evidence of aneurysm or dissection. No significant stenosis of the major arch vessel origins. Right carotid system: No evidence of dissection, stenosis (50% or greater) or occlusion. Left carotid system: No evidence of dissection, stenosis (50% or greater) or occlusion. Vertebral arteries: Left dominant. No evidence of dissection, stenosis (50% or greater) or occlusion. Skeleton: Negative. Other neck: Bilateral level II adenopathy  measuring up to 12 x 18 mm on the left (series 5: Image 99). Upper chest: Negative. Review of the MIP images confirms the above findings CTA HEAD FINDINGS Anterior circulation: No significant stenosis, proximal occlusion, aneurysm, or vascular malformation. Posterior circulation: No significant stenosis, proximal occlusion, aneurysm, or vascular malformation. Venous sinuses: As permitted by contrast timing, patent. Anatomic variants: No anterior communicating artery. Large bilateral posterior communicating arteries. Delayed phase: No abnormal intracranial enhancement. Review of the MIP images confirms the above findings IMPRESSION: 1. Patent carotid and vertebral arteries. No dissection, aneurysm, or hemodynamically significant stenosis utilizing NASCET criteria. 2. Patent circle of Willis. No large vessel occlusion, aneurysm, or significant stenosis. 3. No abnormal enhancement of the brain. 4. Bilateral upper anterior cervical lymphadenopathy is nonspecific, possibly reactive to underlying pharyngitis or upper respiratory infection. Differential includes systemic infectious/inflammatory processes and lymphoproliferative disease. Electronically Signed   By: Mitzi Hansen M.D.   On: 08/17/2017 06:50   Mr Laqueta Jean ZO Contrast  Result Date: 08/17/2017 CLINICAL DATA:  History of pseudotumor. Left-sided weakness beginning several days ago. Sudden onset of dizziness. History of TTP. EXAM: MRI HEAD WITHOUT AND WITH CONTRAST TECHNIQUE: Multiplanar, multiecho pulse sequences of the brain and surrounding structures were obtained without and with intravenous contrast. CONTRAST:  15mL MULTIHANCE GADOBENATE DIMEGLUMINE 529 MG/ML IV SOLN COMPARISON:  CT studies same day.  MRI 06/11/2015. FINDINGS: Brain: There are scattered acute embolic infarctions within multiple territories consistent with micro emboli from the heart or ascending aorta. There are a few small subcentimeter acute infarctions in the right cerebellum.  Left cerebellum is normal. Brainstem is normal. Left cerebral hemisphere is normal. There are scattered acute infarctions within the right posterior frontal region consistent with embolic infarctions within right MCA branch vessels. No large confluent infarction. No mass effect or hemorrhage. No shift. No evidence of old insults. No hydrocephalus. No extra-axial collection. No abnormal contrast enhancement. Vascular: Major vessels at the base of the brain show flow. Skull and upper cervical spine: Negative Sinuses/Orbits: Clear/normal Other: None IMPRESSION: Small acute infarctions in the right cerebellum. Several small acute infarctions in the right posterior frontal region. No large confluent infarction, hemorrhage or mass effect. Small acute infarctions in multiple vascular territories suggests embolic disease from the heart or ascending aorta. Electronically Signed  By: Paulina Fusi M.D.   On: 08/17/2017 12:12    Assessment and Plan:   1. CVA - recommend TEE to determine if there is a cardiac source of emboli - she may discharge home and return on 08/22/17 for TEE with Dr. Duke Salvia, scheduled for 1pm, arrival to [facility] at 11:30 AM NPO.  - may recommend event monitor to screen for atrial arrhythmias if no cardiac source is found on TEE - she expressed understanding of the plan, including risks and benefits.    For questions or updates, please contact [facility] Please consult [email or web address] for contact info under Cardiology/STEMI. Daytime calls, contact the Day Call APP (6a-8a) or assigned team (Teams A-D) provider (7:30a - 5p). All other daytime calls (7:30-5p), contact the Card Master @ [phone].   Nighttime calls, contact the assigned APP (5p-8p) or MD (6:30p-8p). Overnight calls (8p-6a), contact the on call Fellow @ [phone].   Signed, Marcelino Duster, [place]  08/19/2017 3:37 PM   Personally seen and examined. Agree with above.  32 year old with stroke.  Studying to be a  Engineer, site She has left hand difficulty with writing, sensation Heart RRR (tele personally viewed, no AFIB), lungs clear, no edema, alert  CVA  - Agree with TEE. Discussed risks and benefits. Willing to proceed.   - Consider event monitor  Donato Schultz, MD

## 2017-08-22 NOTE — Progress Notes (Signed)
  Echocardiogram Echocardiogram Transesophageal has been performed.  Leta JunglingCooper, Aleksey Newbern M 08/22/2017, 2:57 PM

## 2017-08-22 NOTE — Anesthesia Preprocedure Evaluation (Addendum)
Anesthesia Evaluation  Patient identified by MRN, date of birth, ID band Patient awake    Reviewed: Allergy & Precautions, NPO status , Patient's Chart, lab work & pertinent test results  Airway Mallampati: I  TM Distance: >3 FB Neck ROM: Full  Mouth opening: Limited Mouth Opening  Dental  (+) Teeth Intact, Poor Dentition, Dental Advisory Given   Pulmonary neg pulmonary ROS, Current Smoker,    breath sounds clear to auscultation       Cardiovascular + Peripheral Vascular Disease   Rhythm:Regular Rate:Normal     Neuro/Psych  Headaches,  Neuromuscular disease CVA    GI/Hepatic negative GI ROS, Neg liver ROS,   Endo/Other  negative endocrine ROS  Renal/GU negative Renal ROS     Musculoskeletal  (+) Arthritis ,   Abdominal (+) + obese,   Peds  Hematology negative hematology ROS (+)   Anesthesia Other Findings Day of surgery medications reviewed with the patient.  Reproductive/Obstetrics                            Lab Results  Component Value Date   WBC 8.1 08/20/2017   HGB 13.7 08/20/2017   HCT 42.8 08/20/2017   MCV 92.8 08/20/2017   PLT 212 08/20/2017   Lab Results  Component Value Date   CREATININE 0.94 08/20/2017   BUN 15 08/20/2017   NA 138 08/20/2017   K 3.6 08/20/2017   CL 108 08/20/2017   CO2 23 08/20/2017    EKG: normal EKG, normal sinus rhythm.  Echo: - Left ventricle: The cavity size was normal. Systolic function was   normal. The estimated ejection fraction was in the range of 55%   to 60%. Wall motion was normal; there were no regional wall   motion abnormalities.   Anesthesia Physical Anesthesia Plan  ASA: III  Anesthesia Plan: MAC   Post-op Pain Management:    Induction: Intravenous  PONV Risk Score and Plan:   Airway Management Planned: Mask  Additional Equipment:   Intra-op Plan:   Post-operative Plan:   Informed Consent: I have reviewed the  patients History and Physical, chart, labs and discussed the procedure including the risks, benefits and alternatives for the proposed anesthesia with the patient or authorized representative who has indicated his/her understanding and acceptance.     Plan Discussed with: CRNA  Anesthesia Plan Comments:         Anesthesia Quick Evaluation

## 2017-08-22 NOTE — CV Procedure (Signed)
Brief TEE Note  LVEF 55-60% Trivial MR No ASD or PFO No LA or LAA thrombus or mass  For additional details see full report.  Nishan Ovens C. Duke Salviaandolph, MD, [facility]  08/22/2017 2:44 PM

## 2017-08-22 NOTE — Interval H&P Note (Signed)
History and Physical Interval Note:  08/22/2017 1:18 PM  Salomon FickHeather L Hartnett  has presented today for surgery, with the diagnosis of STROKE  The various methods of treatment have been discussed with the patient and family. After consideration of risks, benefits and other options for treatment, the patient has consented to  Procedure(s): TRANSESOPHAGEAL ECHOCARDIOGRAM (TEE) (N/A) as a surgical intervention .  The patient's history has been reviewed, patient examined, no change in status, stable for surgery.  I have reviewed the patient's chart and labs.  Questions were answered to the patient's satisfaction.     Chilton Siiffany Wheeler, MD

## 2017-08-22 NOTE — Transfer of Care (Signed)
Immediate Anesthesia Transfer of Care Note  Patient: Krista Maddox  Procedure(s) Performed: Procedure(s): TRANSESOPHAGEAL ECHOCARDIOGRAM (TEE) (N/A)  Patient Location: Endoscopy Unit  Anesthesia Type:MAC  Level of Consciousness: awake, alert  and oriented  Airway & Oxygen Therapy: Patient Spontanous Breathing  Post-op Assessment: Report given to RN, Post -op Vital signs reviewed and stable and Patient moving all extremities X 4  Post vital signs: Reviewed and stable  Last Vitals:  Vitals:   08/22/17 1143  BP: 131/70  Pulse: 86  Resp: 18  Temp: 36.7 C  SpO2: 98%    Last Pain:  Vitals:   08/22/17 1143  TempSrc: Oral         Complications: No apparent anesthesia complications

## 2017-08-24 ENCOUNTER — Telehealth: Payer: Self-pay | Admitting: Neurology

## 2017-08-24 ENCOUNTER — Other Ambulatory Visit: Payer: Self-pay | Admitting: *Deleted

## 2017-08-24 MED ORDER — TOPIRAMATE 100 MG PO TABS: 150.0000 mg | ORAL_TABLET | Freq: Two times a day (BID) | ORAL | 0 refills | Status: DC

## 2017-08-24 MED ORDER — TOPIRAMATE 100 MG PO TABS: ORAL_TABLET | ORAL | 0 refills | Status: DC

## 2017-08-24 NOTE — Telephone Encounter (Signed)
Please call patient regarding her Topimate she needs a refill thanks dg

## 2017-08-24 NOTE — Telephone Encounter (Signed)
Rx sent to pharmacy.  She has pending appt on 10/13/17.

## 2017-08-25 ENCOUNTER — Other Ambulatory Visit: Payer: Self-pay | Admitting: Neurology

## 2017-08-29 ENCOUNTER — Other Ambulatory Visit: Payer: Self-pay | Admitting: *Deleted

## 2017-08-29 ENCOUNTER — Other Ambulatory Visit: Payer: Self-pay

## 2017-08-29 MED ORDER — TOPIRAMATE 100 MG PO TABS: ORAL_TABLET | ORAL | 0 refills | Status: DC

## 2017-08-29 MED FILL — TOPIRAMATE 100 MG TABLET: 100 | 30 days supply | Qty: 90 | Fill #0

## 2017-08-29 NOTE — Patient Outreach (Signed)
[facility]) Care Management  08/29/2017  Krista Maddox 24-Mar-1986 [id number]     EMMI-STROKE RED ON EMMI ALERT Day # 6 Date: 08/28/17 Red Alert Reason: "Smoked or been around smoke? Yes"   Outreach attempt # 1 to patient.  Spoke with patient who reports she is doing okay. She states that she continues to have some left sided weakness to her hand which is her dominant side. Reviewed and addressed red alert with patient. She voices that she is continuing to smoke. She is aware of the risks and complications of smoking and her increased risk of having another stroke. She voices that she has been decreasing her intake of smoking and not smoking as much as normal. RN CM encouraged patient to continue to do so. RN CM provided patient with 1-800-QUIT-NOW contact info for further support. Also encouraged her to discuss smoking cessation options with MD. She had no PCP prior to recent hospitalization. Sh has [facility] and has appt on 09/06/17. She goes to see neuro MD on 10/13/17. She voices she has transportation. She is aware not to drive until she has been medically cleared. She voices she has all her meds and understands when and how to take them. Advised patient that she would continue to get automated EMMI-Stroke post discharge calls to assess how they are doing following recent hospitalization and will receive a call from a nurse if any of their responses were abnormal. Patient voiced understanding and was appreciative of f/u call.        Plan: RN CM will notify [facility] administrative assistant of case status. RN CM will send Stroke educational info to patient via mail.   Antionette Fairy, RN,BSN,CCM [facility] Care Management Telephonic Care Management Coordinator Direct Phone: [phone] Toll Free: [phone] Fax: [phone]

## 2017-08-30 LAB — ALPHA GALACTOSIDASE: Alpha galactosidase, serum: 41.5 nmol/hr/mg prt (ref 28.0–80.0)

## 2017-09-01 ENCOUNTER — Other Ambulatory Visit: Payer: Self-pay

## 2017-09-01 NOTE — Patient Outreach (Signed)
[facility] Care Management  09/01/2017  Krista Maddox 02/27/1986 [id number]     EMMI-STROKE RED ON EMMI ALERT Day # 9 Date: 08/28/17 Red Alert Reason: "Feeling worse overall? Yes"   Outreach attempt # 1 to patient.  Spoke with patient. She voices that for the past few days she hasn't felt too good. She voices that she continues to have a headache at times. However, she feels headaches may have gotten a little more worse or intense. She is taking Tylenol prn which is relieving symptoms. She also reports that she continues to have some tingling to her right hand/side. RN CM reviewed with patient normal symptoms to expect after a stroke as well as abnormal symptoms that would warrant medical attention immediately. Advised patient that she if she is still not feeling better within the next 24 hrs she should consider seeking medical attention. Also, encouraged patient to contact PCP to advise of these symptoms and seek further guidance on what she should do. She voiced understanding. Reviewed with patient s/s of stroke. RN CM has mailed patient some informational stroke info to patient. Patient voices no further RN CM needs or concerns at this time. Advised patient that they would continue to get automated EMMI-Stroke post discharge calls to assess how they are doing following recent hospitalization and will receive a call from a nurse if any of their responses were abnormal. Patient voiced understanding and was appreciative of f/u call.       Plan: RN CM will notify [facility] administrative assistant of case status.    Antionette Fairy, RN,BSN,CCM [facility] Care Management Telephonic Care Management Coordinator Direct Phone: [phone] Toll Free: [phone] Fax: [phone]

## 2017-09-05 ENCOUNTER — Other Ambulatory Visit: Payer: Self-pay

## 2017-09-05 NOTE — Patient Outreach (Signed)
[facility]) Care Management  09/05/2017  Krista Maddox September 05, 1986 [id number]     EMMI-STROKE RED ON EMMI ALERT Day # 13 Date: 09/04/17 Red Alert Reason: "Went to follow up appt? No"   Red on EMMI dashboard received. No outreach call warranted to patient at this time. RN CM addressed issue on previous call. Patient has appt scheduled on 09/06/17 to establish care with new PCP(Kimberly Tiburcio Pea, FNP) .        Plan: RN CM will notify [facility] administrative assistant of case status.    Antionette Fairy, RN,BSN,CCM [facility] Care Management Telephonic Care Management Coordinator Direct Phone: [phone] Toll Free: [phone] Fax: [phone]

## 2017-09-06 ENCOUNTER — Ambulatory Visit (INDEPENDENT_AMBULATORY_CARE_PROVIDER_SITE_OTHER): Payer: Self-pay | Admitting: Family Medicine

## 2017-09-06 ENCOUNTER — Encounter: Payer: Self-pay | Admitting: Family Medicine

## 2017-09-06 VITALS — BP 140/76 | HR 100 | Temp 98.7°F | Resp 12 | Ht 65.0 in | Wt 206.0 lb

## 2017-09-06 DIAGNOSIS — I776 Arteritis, unspecified: Secondary | ICD-10-CM

## 2017-09-06 DIAGNOSIS — Z3200 Encounter for pregnancy test, result unknown: Secondary | ICD-10-CM

## 2017-09-06 DIAGNOSIS — G43709 Chronic migraine without aura, not intractable, without status migrainosus: Secondary | ICD-10-CM

## 2017-09-06 DIAGNOSIS — I639 Cerebral infarction, unspecified: Secondary | ICD-10-CM

## 2017-09-06 LAB — POCT URINALYSIS DIP (DEVICE)
Bilirubin Urine: NEGATIVE
Glucose, UA: NEGATIVE mg/dL
HGB URINE DIPSTICK: NEGATIVE
KETONES UR: NEGATIVE mg/dL
Leukocytes, UA: NEGATIVE
Nitrite: NEGATIVE
PH: 7 (ref 5.0–8.0)
PROTEIN: NEGATIVE mg/dL
SPECIFIC GRAVITY, URINE: 1.02 (ref 1.005–1.030)
Urobilinogen, UA: 0.2 mg/dL (ref 0.0–1.0)

## 2017-09-06 LAB — POCT URINE PREGNANCY: Preg Test, Ur: NEGATIVE

## 2017-09-06 MED ORDER — ATORVASTATIN CALCIUM 10 MG PO TABS: 10.0000 mg | ORAL_TABLET | Freq: Every day | ORAL | 2 refills | Status: DC

## 2017-09-06 MED ORDER — ATORVASTATIN CALCIUM 10 MG PO TABS
10.0000 mg | ORAL_TABLET | Freq: Every day | ORAL | 2 refills | Status: DC
Start: 2017-09-06 — End: 2017-09-06

## 2017-09-06 MED ORDER — GABAPENTIN 300 MG PO CAPS: ORAL_CAPSULE | ORAL | 6 refills | Status: DC

## 2017-09-06 MED ORDER — BUTALBITAL-APAP-CAFFEINE 50-325-40 MG PO TABS: 1.0000 | ORAL_TABLET | Freq: Four times a day (QID) | ORAL | 0 refills | Status: DC | PRN

## 2017-09-06 MED ORDER — ATORVASTATIN CALCIUM 10 MG PO TABS
10.0000 mg | ORAL_TABLET | Freq: Every day | ORAL | 0 refills | Status: DC
Start: 2017-09-06 — End: 2017-09-06

## 2017-09-06 MED FILL — BUTALB-ACETAMIN-CAFF 50-325: 50-325-40 | 7 days supply | Qty: 60 | Fill #0

## 2017-09-06 MED FILL — ?ATORVASTATIN 10 MG TABLET: 10 | 30 days supply | Qty: 30 | Fill #0

## 2017-09-06 NOTE — Progress Notes (Signed)
Patient ID: Krista Maddox, female    DOB: September 22, 1986, 31 y.o.   MRN: [id number]  PCP: Bing Neighbors, FNP  Chief Complaint  Patient presents with  . Hospitalization Follow-up  . Establish Care    Subjective:  HPI Krista Maddox is a 31 y.o. female presents to establish care and hospital follow-up. Medical history significant for only degenerative disc disease, headaches, and TTP. Sharina presented to [facility] ED wit a complaint of acute onset left sided facial numbness and headache.  She recently suffered from a small infarcts right cerebellum on 08/16/2017. Initial CT was negative. Follow-up MRI 08/17/2017 confirmed:  Impression:  Small acute infarctions in the right cerebellum. Several small acute infarctions in the right posterior frontal region. No large confluent infarction, hemorrhage or mass effect. Small acute infarctions in multiple vascular territories suggests embolic disease from the heart or ascending aorta.  Metta was hospitalized for a period of 4 days.  TEE was negative of thrombus or abnormal cardiac function. EF: 60-65%. Today she complains of continued left-facial numbness, left arm weakness. She is a current smoker and has taken oral contraceptives for sometime. Oral contraceptives were discontinue, smoking cessation stressed.  Her headache pain was diagnosed as vasculitis evidenced by mildly elevated sed rate. Reports only a mild daily headache presently which responds to rizatriptan and Fioricet. Denies dizziness, chest pain, or shortness of breath. Social History   Social History  . Marital status: Single    Spouse name: N/A  . Number of children: 1  . Years of education: In college   Occupational History  . Dispatcher    Social History Main Topics  . Smoking status: Current Every Day Smoker    Packs/day: 0.50  . Smokeless tobacco: Never Used  . Alcohol use No  . Drug use: No  . Sexual activity: Yes    Birth control/ protection: Pill    Other Topics Concern  . Not on file   Social History Narrative   Lives at home with her mother and daughter.   3-4 cups caffeine daily.   Left-handed.    Family History  Problem Relation Age of Onset  . Diabetes Mother   . Hypertension Mother   . Ovarian cancer Mother        Hysterectomy  . Healthy Father   . Ovarian cancer Maternal Grandmother        Hysterectomy   Review of Systems See HPI  Patient Active Problem List   Diagnosis Date Noted  . 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 History of TTP (thrombotic thrombocytopenic purpura)   . 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 Numbness on left side 08/16/2017  . Acute left lumbar radiculopathy 10/13/2016  . Chronic migraine 06/01/2016  . Pseudotumor cerebri 11/03/2015  . DENTAL PAIN 06/09/2010  . HEADACHE, TENSION 02/04/2010  . SCABIES 01/13/2010  . FOOT PAIN, LEFT 01/13/2010  . CERUMEN IMPACTION, BILATERAL 11/24/2009  . PSORIASIS 11/24/2009  . Smoker 10/15/2009  . 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 PYELONEPHRITIS 10/01/2009  . 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 Authorizing Provider  aspirin 81 MG chewable tablet Chew 1 tablet (81 mg total) by mouth daily. 08/20/17   Calvert Cantor, MD  atorvastatin (LIPITOR) 10 MG tablet Take 1 tablet (10 mg total) by mouth daily at 6 PM. 08/20/17   Calvert Cantor, MD  butalbital-acetaminophen-caffeine (FIORICET, ESGIC) 50-325-40 MG per tablet Take 1 tablet by mouth every 6 (six) hours as needed for headache. Patient not taking: Reported on 08/17/2017 06/26/15   Levert Feinstein, MD  fluticasone [facility]) 50 MCG/ACT nasal spray Place 2 sprays into both nostrils daily. Patient not taking: Reported on 08/17/2017 01/13/17 01/23/17  Benay Pike, NP  gabapentin (NEURONTIN) 300 MG capsule Take 1 capsule (300 mg  total) by mouth 3 (three) times daily. Patient taking differently: Take 300 mg by mouth 2 (two) times daily.  10/13/16   Levert Feinstein, MD  Magnesium Oxide 400 (240 Mg) MG TABS Take 400 mg by mouth 2 (two) times daily. 06/01/16   Levert Feinstein, MD  meloxicam (MOBIC) 15 MG tablet Take 1 tablet (15 mg total) by mouth daily. Please call [phone] to scheduled appt. 05/18/17   Levert Feinstein, MD  nortriptyline (PAMELOR) 50 MG capsule Take 1 capsule (50 mg total) by mouth at bedtime. 05/18/17   Levert Feinstein, MD  Riboflavin 100 MG TABS Take 1 tablet (100 mg total) by mouth 2 (two) times daily. 06/01/16   Levert Feinstein, MD  rizatriptan (MAXALT-MLT) 10 MG disintegrating tablet Take 1 tablet (10 mg total) by mouth as needed. May repeat in 2 hours if needed 01/05/16   Levert Feinstein, MD  topiramate (TOPAMAX) 100 MG tablet TAKE  BY MOUTH TWICE DAILY 08/29/17   Levert Feinstein, MD    Past Medical, Surgical Family and Social History reviewed and updated.    Objective:   Vitals:   09/06/17 1310  BP: 140/76  Pulse: 100  Resp: 12  Temp: 98.7 F (37.1 C)  SpO2: 99%   Wt Readings from Last 3 Encounters:  08/22/17 200 lb (90.7 kg)  08/16/17 200 lb (90.7 kg)  01/13/17 214 lb (97.1 kg)   Physical Exam  Constitutional: She is oriented to person, place, and time. She appears well-developed and well-nourished.  Cardiovascular: Normal rate, regular rhythm, normal heart sounds and intact distal pulses.   Pulmonary/Chest: Effort normal and breath sounds normal.  Musculoskeletal: Normal range of motion.  Neurological: She is alert and oriented to person, place, and time. GCS eye subscore is 4. GCS verbal subscore is 5. GCS motor subscore is 6.  5/5 right upper extremity strength 4/5 left upper extremity strength  Symmetrical gait Mild asymmetry with smile  Skin: Skin is warm and dry.  Psychiatric: She has a normal mood and affect. Her behavior is normal. Judgment and thought content normal.   Assessment & Plan:  1.  Cerebrovascular accident (CVA), unspecified mechanism (HCC)-continue follow-up with neurology. 2. Vasculitis (HCC), re-evaluating Sed Rate and CRP-if both remain elevated will treat with a short course of prednisone  3. Encounter for pregnancy test, negative. Use barrier protection as you should no longer take oral contraceptive 4. Chronic migraine without aura without status migrainosus, not intractable -Continue rizatriptan, topamax, and fioricet for headaches  RTC: 6 weeks for PAP and complete physical exam  Godfrey Pick. Tiburcio Pea, MSN, FNP-C [facility]  [address] [place], [place] [address] [phone]

## 2017-09-07 LAB — CBC WITH DIFFERENTIAL/PLATELET
BASOS ABS: 48 {cells}/uL (ref 0–200)
BASOS PCT: 0.6 %
EOS ABS: 248 {cells}/uL (ref 15–500)
Eosinophils Relative: 3.1 %
HEMATOCRIT: 40.5 % (ref 35.0–45.0)
HEMOGLOBIN: 13.4 g/dL (ref 11.7–15.5)
Lymphs Abs: 2512 cells/uL (ref 850–3900)
MCH: 30.7 pg (ref 27.0–33.0)
MCHC: 33.1 g/dL (ref 32.0–36.0)
MCV: 92.7 fL (ref 80.0–100.0)
MPV: 10.5 fL (ref 7.5–12.5)
Monocytes Relative: 5.7 %
Neutro Abs: 4736 cells/uL (ref 1500–7800)
Neutrophils Relative %: 59.2 %
Platelets: 250 10*3/uL (ref 140–400)
RBC: 4.37 10*6/uL (ref 3.80–5.10)
RDW: 12.4 % (ref 11.0–15.0)
Total Lymphocyte: 31.4 %
WBC: 8 10*3/uL (ref 3.8–10.8)
WBCMIX: 456 {cells}/uL (ref 200–950)

## 2017-09-07 LAB — HIGH SENSITIVITY CRP: hs-CRP: 8.7 mg/L — ABNORMAL HIGH

## 2017-09-08 MED ORDER — GABAPENTIN 300 MG PO CAPS: ORAL_CAPSULE | ORAL | 6 refills | Status: DC

## 2017-09-09 ENCOUNTER — Encounter: Payer: Self-pay | Admitting: Family Medicine

## 2017-09-12 ENCOUNTER — Telehealth: Payer: Self-pay | Admitting: Family Medicine

## 2017-09-12 MED FILL — GABAPENTIN 300 MG CAPSULE: 300 | 23 days supply | Qty: 90 | Fill #0

## 2017-09-12 NOTE — Telephone Encounter (Signed)
Patient notified

## 2017-09-12 NOTE — Telephone Encounter (Signed)
Complete highlighted areas on form for patient and contact her to pick-up form.   Thanks,   Godfrey Pick. Tiburcio Pea, MSN, FNP-C The [facility]  [address] [place], [place] [address] [phone]

## 2017-10-04 ENCOUNTER — Ambulatory Visit (INDEPENDENT_AMBULATORY_CARE_PROVIDER_SITE_OTHER): Payer: Self-pay | Admitting: Family Medicine

## 2017-10-04 ENCOUNTER — Telehealth: Payer: Self-pay | Admitting: Family Medicine

## 2017-10-04 ENCOUNTER — Other Ambulatory Visit (HOSPITAL_COMMUNITY)
Admission: RE | Admit: 2017-10-04 | Discharge: 2017-10-04 | Disposition: A | Discharge status: Home / Self Care | Payer: Self-pay | Source: Ambulatory Visit | Attending: Family Medicine | Admitting: Family Medicine

## 2017-10-04 ENCOUNTER — Encounter: Payer: Self-pay | Admitting: Family Medicine

## 2017-10-04 VITALS — BP 137/84 | HR 92 | Temp 97.9°F | Resp 16 | Ht 65.0 in | Wt 209.0 lb

## 2017-10-04 DIAGNOSIS — Z01419 Encounter for gynecological examination (general) (routine) without abnormal findings: Secondary | ICD-10-CM | POA: Insufficient documentation

## 2017-10-04 DIAGNOSIS — I63321 Cerebral infarction due to thrombosis of right anterior cerebral artery: Secondary | ICD-10-CM

## 2017-10-04 DIAGNOSIS — IMO0002 Reserved for concepts with insufficient information to code with codable children: Secondary | ICD-10-CM

## 2017-10-04 DIAGNOSIS — G43709 Chronic migraine without aura, not intractable, without status migrainosus: Secondary | ICD-10-CM

## 2017-10-04 DIAGNOSIS — N760 Acute vaginitis: Secondary | ICD-10-CM | POA: Insufficient documentation

## 2017-10-04 DIAGNOSIS — B9689 Other specified bacterial agents as the cause of diseases classified elsewhere: Secondary | ICD-10-CM | POA: Insufficient documentation

## 2017-10-04 MED ORDER — NORTRIPTYLINE HCL 50 MG PO CAPS: 100.0000 mg | ORAL_CAPSULE | Freq: Every day | ORAL | 3 refills | Status: DC

## 2017-10-04 MED ORDER — RIZATRIPTAN BENZOATE 10 MG PO TBDP: 10.0000 mg | ORAL_TABLET | ORAL | 3 refills | Status: DC | PRN

## 2017-10-04 MED FILL — NORTRIPTYLINE HCL 50 MG CAP: 50 | 30 days supply | Qty: 60 | Fill #0

## 2017-10-04 MED FILL — RIZATRIPTAN 10 MG ODT: 10 | 30 days supply | Qty: 9 | Fill #0

## 2017-10-04 NOTE — Telephone Encounter (Signed)
Please contact patient to return to provide a urine sample for UA and pregnancy to complete her gynecological visit.  Krista PickKimberly S. Tiburcio PeaHarris, MSN, FNP-C The [facility]  [address] [place], [place] [address] [phone]

## 2017-10-04 NOTE — Progress Notes (Signed)
Patient ID: Krista Maddox, female    DOB: Mar 20, 1986, 31 y.o.   MRN: [id number]  PCP: Bing Neighbors, FNP  Chief Complaint  Patient presents with  . Annual Exam    Subjective:  HPI Krista Maddox is a 31 y.o. female presents for a complete physical exam. She recently established care and medical problems significant for recent CVA, chronic migraines, obesity, prior OCP use, and former smoker. Recent labs were unremarkable. She reports today, that she continues to experience residual left hand weakness, and headaches. Headaches continue daily. She is currently prescribed topamax, rizatriptan, nortriptyline, Fioricet, for headache management. In spite of medication regimen she complains of daily headaches upon awakening and at bedtime. She is unaware of what provokes the headaches except she has noticed an increased in frequency of headaches. Unfortunately, due to being uninsured she is unable to attend neurology appointment. She was provided [facility] and reports problems supplying some of the financial documents requested.   Social History   Social History  . Marital status: Single    Spouse name: N/A  . Number of children: 1  . Years of education: In college   Occupational History  . Dispatcher    Social History Main Topics  . Smoking status: Former Smoker    Packs/day: 0.50  . Smokeless tobacco: Never Used  . Alcohol use No  . Drug use: No  . Sexual activity: Yes    Birth control/ protection: Pill   Other Topics Concern  . Not on file   Social History Narrative   Lives at home with her mother and daughter.   3-4 cups caffeine daily.   Left-handed.    Family History  Problem Relation Age of Onset  . Diabetes Mother   . Hypertension Mother   . Ovarian cancer Mother        Hysterectomy  . Healthy Father   . Ovarian cancer Maternal Grandmother        Hysterectomy     Review of Systems  Constitutional: Positive for fatigue.   HENT: Negative.   Respiratory: Negative.   Cardiovascular: Negative.   Gastrointestinal: Negative.   Genitourinary: Negative.   Musculoskeletal: Negative.   Skin: Negative.   Neurological: Positive for headaches.       Able write now left hand  Psychiatric/Behavioral: Negative for dysphoric mood and sleep disturbance. The patient is not nervous/anxious.     Patient Active Problem List   Diagnosis Date Noted  . 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 History of TTP (thrombotic thrombocytopenic purpura)   . 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 Numbness on left side 08/16/2017  . Acute left lumbar radiculopathy 10/13/2016  . Chronic migraine 06/01/2016  . Pseudotumor cerebri 11/03/2015  . DENTAL PAIN 06/09/2010  . HEADACHE, TENSION 02/04/2010  . SCABIES 01/13/2010  . FOOT PAIN, LEFT 01/13/2010  . CERUMEN IMPACTION, BILATERAL 11/24/2009  . PSORIASIS 11/24/2009  . Smoker 10/15/2009  . 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 PYELONEPHRITIS 10/01/2009  . 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 Authorizing Provider  aspirin 81 MG chewable tablet Chew 1 tablet (81 mg total) by mouth daily. 08/20/17  Yes Calvert Cantor, MD  atorvastatin (LIPITOR) 10 MG tablet Take 1 tablet (10 mg total) by mouth daily at 6 PM. 09/06/17  Yes Bing Neighbors, FNP  butalbital-acetaminophen-caffeine (FIORICET,  ESGIC) 50-325-40 MG tablet Take 1-2 tablets by mouth every 6 (six) hours as needed for headache. 09/06/17  Yes Bing Neighbors, FNP  gabapentin (NEURONTIN) 300 MG capsule Take 1 tablet (300 mg) two times daily. May increase dose to 600 mg twice daily as needed. 09/08/17  Yes Bing Neighbors, FNP  Magnesium Oxide 400 (240 Mg) MG TABS Take 400 mg by mouth 2 (two) times daily. 06/01/16  Yes Levert Feinstein, MD  meloxicam (MOBIC) 15 MG  tablet Take 1 tablet (15 mg total) by mouth daily. Please call [phone] to scheduled appt. 05/18/17  Yes Levert Feinstein, MD  norethindrone (MICRONOR,CAMILA,ERRIN) 0.35 MG tablet Take 1 tablet by mouth daily.   Yes [provider]  nortriptyline (PAMELOR) 50 MG capsule Take 1 capsule (50 mg total) by mouth at bedtime. 05/18/17  Yes Levert Feinstein, MD  Riboflavin 100 MG TABS Take 1 tablet (100 mg total) by mouth 2 (two) times daily. 06/01/16  Yes Levert Feinstein, MD  rizatriptan (MAXALT-MLT) 10 MG disintegrating tablet Take 1 tablet (10 mg total) by mouth as needed. May repeat in 2 hours if needed 01/05/16  Yes Levert Feinstein, MD  topiramate (TOPAMAX) 100 MG tablet TAKE 150MG  BY MOUTH TWICE DAILY 08/29/17  Yes Levert Feinstein, MD  fluticasone (FLONASE) 50 MCG/ACT nasal spray Place 2 sprays into both nostrils daily. Patient not taking: Reported on 08/17/2017 01/13/17 01/23/17  Benay Pike, NP    Past Medical, Surgical Family and Social History reviewed and updated.    Objective:   Today's Vitals   10/04/17 1343  BP: 137/84  Pulse: 92  Resp: 16  Temp: 97.9 F (36.6 C)  TempSrc: Oral  SpO2: 100%  Weight: 209 lb (94.8 kg)  Height: 5\' 5"  (1.651 m)    Wt Readings from Last 3 Encounters:  10/04/17 209 lb (94.8 kg)  09/06/17 206 lb (93.4 kg)  08/22/17 200 lb (90.7 kg)   Physical Exam  Constitutional: She is oriented to person, place, and time. She appears well-developed and well-nourished.  HENT:  Head: Normocephalic and atraumatic.  Eyes: Pupils are equal, round, and reactive to light. Conjunctivae and EOM are normal.  Neck: Normal range of motion. Neck supple. No thyromegaly present.  Cardiovascular: Normal rate, regular rhythm, normal heart sounds and intact distal pulses.   Pulmonary/Chest: Effort normal and breath sounds normal.  Abdominal: Soft. Bowel sounds are normal. She exhibits no distension. There is no tenderness. There is no rebound and no guarding.  Musculoskeletal: Normal range of  motion.  Lymphadenopathy:    She has no cervical adenopathy.  Neurological: She is alert and oriented to person, place, and time.  Skin: Skin is dry.  Psychiatric: She has a normal mood and affect. Her behavior is normal. Judgment and thought content normal.   Assessment & Plan:  1. Gynecologic exam normal- Cytology - PAP [place] Urine pregnancy- future, no urine sample. 2. Cerebrovascular accident (CVA) due to thrombosis of right anterior cerebral artery (HCC) -Encouraged obtaining a payer source in order to be evaluated by Neurology as she continues to experience left extremity hemiparesis. Current on statin and aspirin therapy. Advised to continue medication regimen. 3. Chronic migraine, continue gabapentin, topiramate, rizatriptan. I am increasing nortriptyline 100 mg at bedtime.  Orders Placed This Encounter  Procedures  . POCT urine pregnancy   Meds ordered this encounter  Medications  . nortriptyline (PAMELOR) 50 MG capsule    Sig: Take 2 capsules (100 mg total) by mouth at bedtime.  Dispense:  60 capsule    Refill:  3    Please call [phone] to schedule a follow up appt.    Order Specific Question:   Supervising Provider    Answer:   Quentin AngstJEGEDE, OLUGBEMIGA E [id number]]  . rizatriptan (MAXALT-MLT) 10 MG disintegrating tablet    Sig: Take 1 tablet (10 mg total) by mouth as needed. May repeat in 2 hours if needed    Dispense:  27 tablet    Refill:  3    Order Specific Question:   Supervising Provider    Answer:   Quentin AngstJEGEDE, OLUGBEMIGA E [[id number]]   Return for care 3 months for headache management.   Godfrey PickKimberly S. Tiburcio PeaHarris, MSN, FNP-C [facility]  [address] [place], [place] [address] [phone]

## 2017-10-04 NOTE — Patient Instructions (Signed)
Ive increased your nortriptyline to 100 mg in hopes of reducing your headache frequency. I will refill Maxalt as well. Continue all other medications as prescribed.

## 2017-10-06 ENCOUNTER — Telehealth: Payer: Self-pay | Admitting: Family Medicine

## 2017-10-06 LAB — CYTOLOGY - PAP
Adequacy: ABSENT
BACTERIAL VAGINITIS: POSITIVE — AB
CANDIDA VAGINITIS: NEGATIVE
CHLAMYDIA, DNA PROBE: NEGATIVE
Diagnosis: NEGATIVE
NEISSERIA GONORRHEA: NEGATIVE
TRICH (WINDOWPATH): NEGATIVE

## 2017-10-06 MED ORDER — METRONIDAZOLE 500 MG PO TABS: 500.0000 mg | ORAL_TABLET | Freq: Two times a day (BID) | ORAL | 0 refills | Status: DC

## 2017-10-06 MED FILL — ?ATORVASTATIN 10 MG TABLET: 10 | 30 days supply | Qty: 30 | Fill #1 | Status: TO

## 2017-10-06 MED FILL — metroNIDAZOLE 500 MG TABS: 500 | 7 days supply | Qty: 14 | Fill #0

## 2017-10-06 MED FILL — GABAPENTIN 300 MG CAPSULE: 300 | 22 days supply | Qty: 90 | Fill #1 | Status: TO

## 2017-10-06 NOTE — Telephone Encounter (Signed)
PAP results were negative of any abnormal cells. However, specimen did indicate the presence of bacterial vaginosis which is not a STI it is simply an overgrowth of normal flora bacteria within the vagina. I am prescribing flagyl 500 mg BID for a total of 7 days.   Krista PickKimberly S. Tiburcio PeaHarris, MSN, FNP-C The Patient Care [facility]  [address] [place], [place] [address] [phone]

## 2017-10-06 NOTE — Telephone Encounter (Signed)
Patient notified and will pick up medication  

## 2017-10-10 LAB — CERVICOVAGINAL ANCILLARY ONLY: Herpes: NEGATIVE

## 2017-10-11 ENCOUNTER — Emergency Department (HOSPITAL_COMMUNITY)
Admission: EM | Admit: 2017-10-11 | Discharge: 2017-10-12 | Disposition: A | Discharge status: Home / Self Care | Payer: Self-pay | Attending: Physician Assistant | Admitting: Physician Assistant

## 2017-10-11 ENCOUNTER — Emergency Department (HOSPITAL_COMMUNITY): Payer: Self-pay

## 2017-10-11 ENCOUNTER — Encounter (HOSPITAL_COMMUNITY): Payer: Self-pay | Admitting: Emergency Medicine

## 2017-10-11 DIAGNOSIS — R51 Headache: Secondary | ICD-10-CM | POA: Insufficient documentation

## 2017-10-11 DIAGNOSIS — F17210 Nicotine dependence, cigarettes, uncomplicated: Secondary | ICD-10-CM | POA: Insufficient documentation

## 2017-10-11 DIAGNOSIS — Z79899 Other long term (current) drug therapy: Secondary | ICD-10-CM | POA: Insufficient documentation

## 2017-10-11 DIAGNOSIS — Z7982 Long term (current) use of aspirin: Secondary | ICD-10-CM | POA: Insufficient documentation

## 2017-10-11 DIAGNOSIS — N10 Acute tubulo-interstitial nephritis: Secondary | ICD-10-CM | POA: Insufficient documentation

## 2017-10-11 DIAGNOSIS — R519 Headache, unspecified: Secondary | ICD-10-CM

## 2017-10-11 LAB — CBC
HEMATOCRIT: 41.1 % (ref 36.0–46.0)
HEMOGLOBIN: 13.4 g/dL (ref 12.0–15.0)
MCH: 30.7 pg (ref 26.0–34.0)
MCHC: 32.6 g/dL (ref 30.0–36.0)
MCV: 94.1 fL (ref 78.0–100.0)
Platelets: 237 10*3/uL (ref 150–400)
RBC: 4.37 MIL/uL (ref 3.87–5.11)
RDW: 14.1 % (ref 11.5–15.5)
WBC: 11.8 10*3/uL — ABNORMAL HIGH (ref 4.0–10.5)

## 2017-10-11 LAB — URINALYSIS, ROUTINE W REFLEX MICROSCOPIC
BILIRUBIN URINE: NEGATIVE
GLUCOSE, UA: NEGATIVE mg/dL
HGB URINE DIPSTICK: NEGATIVE
Ketones, ur: NEGATIVE mg/dL
Leukocytes, UA: NEGATIVE
Nitrite: NEGATIVE
PH: 6 (ref 5.0–8.0)
Protein, ur: NEGATIVE mg/dL
SPECIFIC GRAVITY, URINE: 1.016 (ref 1.005–1.030)

## 2017-10-11 LAB — BASIC METABOLIC PANEL
ANION GAP: 10 (ref 5–15)
BUN: 11 mg/dL (ref 6–20)
CHLORIDE: 109 mmol/L (ref 101–111)
CO2: 20 mmol/L — AB (ref 22–32)
Calcium: 8.8 mg/dL — ABNORMAL LOW (ref 8.9–10.3)
Creatinine, Ser: 0.77 mg/dL (ref 0.44–1.00)
GFR calc non Af Amer: >60 mL/min (ref >60)
GLUCOSE: 85 mg/dL (ref 65–99)
Potassium: 3.7 mmol/L (ref 3.5–5.1)
Sodium: 139 mmol/L (ref 135–145)

## 2017-10-11 LAB — HCG, QUANTITATIVE, PREGNANCY: HCG, BETA CHAIN, QUANT, S: 2 m[IU]/mL (ref <5)

## 2017-10-11 MED ORDER — KETOROLAC TROMETHAMINE 30 MG/ML IJ SOLN
30.0000 mg | Freq: Once | INTRAMUSCULAR | Status: AC
  Administered 2017-10-11: 30 mg via INTRAVENOUS
  Filled 2017-10-11: qty 1

## 2017-10-11 MED ORDER — SODIUM CHLORIDE 0.9 % IV BOLUS (SEPSIS)
1000.0000 mL | Freq: Once | INTRAVENOUS | Status: AC
  Administered 2017-10-11: 1000 mL via INTRAVENOUS

## 2017-10-11 MED ORDER — LORAZEPAM 2 MG/ML IJ SOLN
0.5000 mg | Freq: Once | INTRAMUSCULAR | Status: AC
  Administered 2017-10-11: 0.5 mg via INTRAVENOUS
  Filled 2017-10-11: qty 1

## 2017-10-11 MED ORDER — METHYLPREDNISOLONE SODIUM SUCC 125 MG IJ SOLR
80.0000 mg | Freq: Once | INTRAMUSCULAR | Status: AC
  Administered 2017-10-11: 80 mg via INTRAVENOUS
  Filled 2017-10-11: qty 2

## 2017-10-11 MED ORDER — METOCLOPRAMIDE HCL 5 MG/ML IJ SOLN
10.0000 mg | Freq: Once | INTRAMUSCULAR | Status: AC
  Administered 2017-10-11: 10 mg via INTRAVENOUS
  Filled 2017-10-11: qty 2

## 2017-10-11 NOTE — ED Notes (Signed)
Pt back from MRI 

## 2017-10-11 NOTE — ED Triage Notes (Signed)
States has had a h/a since sept. Woke up at 1 am had a h/a rt side of head got dizzy left side of body went numb and went back to sleep woke up this am at 6 was no better , she drove herself here , felt really dizzy

## 2017-10-11 NOTE — ED Provider Notes (Signed)
[facility] Provider Note   CSN: [id number] Arrival date & time: 10/11/17  1413     History   Chief Complaint Chief Complaint  Patient presents with  . Dizziness    HPI Krista Maddox is a 31 y.o. female.  HPI   Krista Maddox is a 31 y.o. female, with a history of CVA, headaches, and TTP, presenting to the ED with sudden onset of right sided headache that woke her up around 1 AM this morning. Headache was throbbing, 10/10, generalized right side, nonradiating. Accompanied by left sided upper and lower extremity numbness, lasting for about 10 minutes. Patient went back to sleep and woke up at 6 am with lightheadedness. Currently headache is 8/10, throbbing, nonradiating. Has not taken anything for the headache. Endorses normal eating and drinking.   States she has residual numbness in her left face and left hand from her stroke Sept 3, 2018.   Denies N/V/D, fever/chills, falls/trauma, vision abnormalities, speech deficits, chest pain, shortness of breath, abdominal pain, or any other complaints.   Past Medical History:  Diagnosis Date  . DDD (degenerative disc disease), lumbar   . Headache   . Other disorders of optic nerve, not elsewhere classified, unspecified eye   . TTP (thrombotic thrombocytopenic purpura) [facility](HCC)     Patient Active Problem List   Diagnosis Date Noted  . 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 History of TTP (thrombotic thrombocytopenic purpura)   . 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 Numbness on left side 08/16/2017  . Acute left lumbar radiculopathy 10/13/2016  . Chronic migraine 06/01/2016  . Pseudotumor cerebri 11/03/2015  . DENTAL PAIN 06/09/2010  . HEADACHE, TENSION 02/04/2010  . SCABIES 01/13/2010  . FOOT PAIN, LEFT 01/13/2010  . CERUMEN IMPACTION, BILATERAL 11/24/2009  . PSORIASIS 11/24/2009  . Smoker 10/15/2009  .  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 PYELONEPHRITIS 10/01/2009  . 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 CESAREAN SECTION    . CHOLECYSTECTOMY    . TEE WITHOUT CARDIOVERSION N/A 08/22/2017   Procedure: TRANSESOPHAGEAL ECHOCARDIOGRAM (TEE);  Surgeon: Chilton Siandolph, Tiffany, MD;  [facility];  Service: Cardiovascular;  Laterality: N/A;  . WISDOM TOOTH EXTRACTION      OB History    No data available       Home Medications    Prior to Admission medications   Medication Sig Start Date End Date Taking? Authorizing Provider  aspirin 81 MG chewable tablet Chew 1 tablet (81 mg total) by mouth daily. 08/20/17  Yes Calvert Cantorizwan, Saima, MD  atorvastatin (LIPITOR) 10 MG tablet Take 1 tablet (10 mg total) by mouth daily at 6 PM. 09/06/17  Yes Bing NeighborsHarris, Kimberly S, FNP  butalbital-acetaminophen-caffeine (FIORICET, ESGIC) [phone] MG tablet Take 1-2 tablets by mouth every 6 (six) hours as needed for headache. 09/06/17  Yes Bing NeighborsHarris, Kimberly S, FNP  gabapentin (NEURONTIN) 300 MG capsule Take 1 tablet (300 mg) two times daily. May increase dose to 600 mg twice daily as needed. Patient taking differently: Take 300-600 mg by mouth See admin instructions. 300 mg in the morning and 600 mg at bedtime and may take an additional 300 mg in the afternoon as needed for nerve pain 09/08/17  Yes Bing NeighborsHarris, Kimberly S, FNP  Magnesium Oxide 400 (240 Mg) MG TABS Take 400 mg by mouth 2 (two) times daily. 06/01/16  Yes Levert FeinsteinYan, Yijun, MD  meloxicam [facility])  15 MG tablet Take 1 tablet (15 mg total) by mouth daily. Please call [phone] to scheduled appt. Patient taking differently: Take 15 mg by mouth daily.  05/18/17  Yes Levert Feinstein, MD  metroNIDAZOLE (FLAGYL) 500 MG tablet Take 1 tablet (500 mg total) by mouth 2 (two) times daily with a meal. DO NOT CONSUME ALCOHOL WHILE TAKING THIS MEDICATION. Patient taking differently: Take 500 mg by mouth 2 (two) times daily with a  meal. FOR 7 DAYS and DO NOT CONSUME ALCOHOL WHILE TAKING THIS MEDICATION 10/06/17  Yes Bing Neighbors, FNP  norethindrone (MICRONOR,CAMILA,ERRIN) 0.35 MG tablet Take 1 tablet by mouth daily.   Yes [provider]  nortriptyline (PAMELOR) 50 MG capsule Take 2 capsules (100 mg total) by mouth at bedtime. 10/04/17  Yes Bing Neighbors, FNP  Riboflavin 100 MG TABS Take 1 tablet (100 mg total) by mouth 2 (two) times daily. 06/01/16  Yes Levert Feinstein, MD  rizatriptan (MAXALT-MLT) 10 MG disintegrating tablet Take 1 tablet (10 mg total) by mouth as needed. May repeat in 2 hours if needed Patient taking differently: Take 10 mg by mouth once as needed for migraine. May repeat in 2 hours if needed 10/04/17  Yes Bing Neighbors, FNP  topiramate (TOPAMAX) 100 MG tablet TAKE 150MG  BY MOUTH TWICE DAILY Patient taking differently: Take 150 mg by mouth 2 (two) times daily.  08/29/17  Yes Levert Feinstein, MD  fluticasone (FLONASE) 50 MCG/ACT nasal spray Place 2 sprays into both nostrils daily. Patient not taking: Reported on 10/11/2017 01/13/17 10/11/17  Benay Pike, NP    Family History Family History  Problem Relation Age of Onset  . Diabetes Mother   . Hypertension Mother   . Ovarian cancer Mother        Hysterectomy  . Healthy Father   . Ovarian cancer Maternal Grandmother        Hysterectomy    Social History Social History  Substance Use Topics  . Smoking status: Current Every Day Smoker    Packs/day: 0.50  . Smokeless tobacco: Never Used  . Alcohol use No     Allergies   Tape   Review of Systems Review of Systems  Constitutional: Negative for chills, diaphoresis and fever.  Respiratory: Negative for shortness of breath.   Cardiovascular: Negative for chest pain.  Gastrointestinal: Negative for abdominal pain, diarrhea, nausea and vomiting.  Neurological: Positive for light-headedness, numbness (acute episode resolved) and headaches. Negative for syncope and  weakness.  All other systems reviewed and are negative.    Physical Exam Updated Vital Signs BP 138/89   Pulse (!) 102   Temp 98.3 F (36.8 C) (Oral)   Resp (!) 21   SpO2 100%   Physical Exam  Constitutional: She appears well-developed and well-nourished. No distress.  HENT:  Head: Normocephalic and atraumatic.  Eyes: Pupils are equal, round, and reactive to light. Conjunctivae and EOM are normal.  Neck: Normal range of motion. Neck supple.  Cardiovascular: Normal rate, regular rhythm, normal heart sounds and intact distal pulses.   Pulmonary/Chest: Effort normal and breath sounds normal. No respiratory distress.  Abdominal: Soft. There is no tenderness. There is no guarding.  Musculoskeletal: She exhibits no edema.  Lymphadenopathy:    She has no cervical adenopathy.  Neurological: She is alert.  No sensory deficits.  No noted speech deficits. No aphasia. Patient handles oral secretions without difficulty. No noted swallowing defects.  Grip strength weaker on the left. 4/5 strength for flexion and extension  at the left elbow and left shoulder. Strength 5/5 in the right upper extremity Strength 5/5 with flexion and extension of the hips, knees, and ankles bilaterally.  Patellar DTRs 2+ bilaterally. Negative Romberg. No gait disturbance.  Coordination intact including heel to shin and finger to nose.  Left-sided facial weakness the patient states is chronic. Cranial nerves III-XII otherwise grossly intact, free from acute abnormalities.    Skin: Skin is warm and dry. She is not diaphoretic.  Psychiatric: She has a normal mood and affect. Her behavior is normal.  Nursing note and vitals reviewed.    ED Treatments / Results  Labs (all labs ordered are listed, but only abnormal results are displayed) Labs Reviewed  BASIC METABOLIC PANEL - Abnormal; Notable for the following:       Result Value   CO2 20 (*)    Calcium 8.8 (*)    All other components within normal  limits  CBC - Abnormal; Notable for the following:    WBC 11.8 (*)    All other components within normal limits  URINALYSIS, ROUTINE W REFLEX MICROSCOPIC - Abnormal; Notable for the following:    APPearance HAZY (*)    All other components within normal limits  HCG, QUANTITATIVE, PREGNANCY  CBG MONITORING, ED    EKG  EKG Interpretation  Date/Time:  Tuesday October 11 2017 14:28:57 EDT Ventricular Rate:  98 PR Interval:  130 QRS Duration: 86 QT Interval:  336 QTC Calculation: 428 R Axis:   76 Text Interpretation:  Normal sinus rhythm T wave abnormality, consider inferior ischemia Abnormal ECG Confirmed by Donnetta Hutching ([id number]) on 10/12/2017 8:53:21 PM     QTc  Radiology Ct Head Wo Contrast  Result Date: 10/11/2017 CLINICAL DATA:  Right parietal headache and left-sided weakness beginning yesterday. EXAM: CT HEAD WITHOUT CONTRAST TECHNIQUE: Contiguous axial images were obtained from the base of the skull through the vertex without intravenous contrast. COMPARISON:  Brain MRI and head CT scan 08/27/2017. FINDINGS: Brain: Small infarctions in the right cerebellum and posterior right frontal lobe seen on prior MRI are difficult to appreciate on this examination. No evidence of acute intracranial abnormality including hemorrhage, infarct, mass lesion, mass effect, midline shift or abnormal extra-axial fluid collection. Vascular: No hyperdense vessel or unexpected calcification. Skull: Intact. Sinuses/Orbits: Minimal mucosal thickening right ethmoid air cells noted. Other: None. IMPRESSION: No acute abnormality. Small infarcts in the right cerebellum and right frontal lobe seen on prior MRI are difficult to visualize on this exam. Electronically Signed   By: Drusilla Kanner M.D.   On: 10/11/2017 15:34   Mr Brain Wo Contrast  Result Date: 10/12/2017 CLINICAL DATA:  Severe headache for multiple weeks with dizziness. EXAM: MRI HEAD WITHOUT CONTRAST TECHNIQUE: Multiplanar, multiecho pulse  sequences of the brain and surrounding structures were obtained without intravenous contrast. COMPARISON:  Head CT 10/11/2017 Brain MRI 08/17/2017 FINDINGS: Brain: The midline structures are normal. There is no focal diffusion restriction to indicate acute infarct. The brain parenchymal signal and volume are normal. Signal changes at the sites of the infarcts identified on the 08/17/2017 study have resolved. The brain parenchymal signal is currently normal. There is no mass lesion. No intraparenchymal hematoma or chronic microhemorrhage. The dura is normal and there is no extra-axial collection. Vascular: Major intracranial arterial and venous sinus flow voids are preserved. Skull and upper cervical spine: The visualized skull base, calvarium, upper cervical spine and extracranial soft tissues are normal. Sinuses/Orbits: No fluid levels or advanced mucosal thickening. No mastoid  or middle ear effusion. Normal orbits. IMPRESSION: 1. Normal MRI of the brain. 2. The signal changes within the right frontal lobe and right cerebellum that were demonstrated on the study of 08/17/2017 have resolved. Electronically Signed   By: Deatra Robinson M.D.   On: 10/12/2017 00:15    Procedures Procedures (including critical care time)  Medications Ordered in ED Medications  sodium chloride 0.9 % bolus 1,000 mL (0 mLs Intravenous Stopped 10/11/17 2301)  ketorolac (TORADOL) 30 MG/ML injection 30 mg (30 mg Intravenous Given 10/11/17 2126)  metoCLOPramide (REGLAN) injection 10 mg (10 mg Intravenous Given 10/11/17 2125)  methylPREDNISolone sodium succinate (SOLU-MEDROL) 125 mg/2 mL injection 80 mg (80 mg Intravenous Given 10/11/17 2125)  LORazepam (ATIVAN) injection 0.5 mg (0.5 mg Intravenous Given 10/11/17 2221)     Initial Impression / Assessment and Plan / ED Course  I have reviewed the triage vital signs and the nursing notes.  Pertinent labs & imaging results that were available during my care of the patient were  reviewed by me and considered in my medical decision making (see chart for details).  Clinical Course as of Oct 13 299  Tue Oct 11, 2017  2039 Spoke with Dr. Amada Jupiter, neurologist. States he will review the patient's chart and previous imaging and call back.  [SJ]  2042 Dr. Amada Jupiter states patient should receive MR brain without contrast. If no new abnormalities, patient can be discharged and follow-up with her neurologist.  [SJ]  Wed Oct 12, 2017  0024 Imaging results and plan of care discussed with the patient.  Patient states her headache has resolved.  Agrees to the plan of care and states she is ready for discharge.  [SJ]    Clinical Course User Index [SJ] Joy, Shawn C, PA-C    Patient presents with headache and an episode of acute numbness.  This event occurred greater than 12 hours prior to ED arrival.  Numbness resolved and did not recur.  Headache resolved with conservative measures.  No acute abnormalities on imaging.  Lab results encouraging.  Outpatient neurology follow-up. The patient was given instructions for home care as well as return precautions. Patient voices understanding of these instructions, accepts the plan, and is comfortable with discharge.   Vitals:   10/11/17 2314 10/11/17 2330 10/12/17 0000 10/12/17 0100  BP: 110/80 108/68 108/65 100/63  Pulse: 90 85 94 93  Resp: 18 12 18 17   Temp:      TempSrc:      SpO2: 98% 97% 95% 96%     Final Clinical Impressions(s) / ED Diagnoses   Final diagnoses:  Bad headache    New Prescriptions Discharge Medication List as of 10/12/2017 12:50 AM       Anselm Pancoast, PA-C 10/13/17 0305    Abelino Derrick, MD 10/13/17 1756

## 2017-10-11 NOTE — ED Notes (Signed)
Patient transported to MRI 

## 2017-10-12 LAB — CBG MONITORING, ED: Glucose-Capillary: 98 mg/dL (ref 65–99)

## 2017-10-12 NOTE — Discharge Instructions (Signed)
You have been seen today for headache.  There were no acute abnormalities on the CT scan or MRI.  Please stay well hydrated by drinking plenty of water.  Please follow-up with your neurologist as soon as possible on this matter.

## 2017-10-13 ENCOUNTER — Encounter: Payer: Self-pay | Admitting: Neurology

## 2017-10-13 ENCOUNTER — Telehealth: Payer: Self-pay | Admitting: *Deleted

## 2017-10-13 ENCOUNTER — Other Ambulatory Visit: Payer: Self-pay | Admitting: *Deleted

## 2017-10-13 ENCOUNTER — Ambulatory Visit (INDEPENDENT_AMBULATORY_CARE_PROVIDER_SITE_OTHER): Payer: Medicaid Other | Admitting: Neurology

## 2017-10-13 VITALS — BP 136/76 | HR 112 | Ht 65.0 in | Wt 213.0 lb

## 2017-10-13 DIAGNOSIS — G932 Benign intracranial hypertension: Secondary | ICD-10-CM

## 2017-10-13 DIAGNOSIS — IMO0002 Reserved for concepts with insufficient information to code with codable children: Secondary | ICD-10-CM

## 2017-10-13 DIAGNOSIS — G43709 Chronic migraine without aura, not intractable, without status migrainosus: Secondary | ICD-10-CM | POA: Diagnosis not present

## 2017-10-13 DIAGNOSIS — I6340 Cerebral infarction due to embolism of unspecified cerebral artery: Secondary | ICD-10-CM

## 2017-10-13 MED ORDER — PROPRANOLOL HCL 80 MG PO TABS: 80.0000 mg | ORAL_TABLET | Freq: Two times a day (BID) | ORAL | 11 refills | Status: DC

## 2017-10-13 MED ORDER — VENLAFAXINE HCL ER 37.5 MG PO CP24: 37.5000 mg | ORAL_CAPSULE | Freq: Every day | ORAL | 11 refills | Status: DC

## 2017-10-13 MED ORDER — ONDANSETRON 4 MG PO TBDP: 4.0000 mg | ORAL_TABLET | Freq: Three times a day (TID) | ORAL | 0 refills | Status: DC | PRN

## 2017-10-13 MED ORDER — DICLOFENAC POTASSIUM(MIGRAINE) 50 MG PO PACK: 50.0000 mg | PACK | Freq: Every day | ORAL | 11 refills | Status: DC | PRN

## 2017-10-13 MED ORDER — TIZANIDINE HCL 4 MG PO TABS: 4.0000 mg | ORAL_TABLET | Freq: Four times a day (QID) | ORAL | 0 refills | Status: DC | PRN

## 2017-10-13 NOTE — Progress Notes (Addendum)
[facility]  PATIENT: Krista Maddox DOB: 03-Feb-1986   HISTORY OF PRESENT ILLNESS:Krista Maddox is a 31 years old right-handed female, seen in consultation by his ophthalmologist Dr. Alois Cliche for evaluation of bilateral papillary edema. Initial evaluation was on November 03 2015   I reviewed notes from Dr. Idolina Primer in May 28 2015, patient was seen for complains of blurry vision, especially seen distance, on examination, visual acuity right eye 20/25 minus, left 20/30 plus, bilateral papillary edema  She denied previous history of headache, since May 2016, she began to notice frequent headaches, bilateral retro-orbital area constant pressure, 8 out of 10 on daily basis, sometimes it would exacerbate to a more severe 10 out of 10 pounding headaches, with associated light noise sensitivity, nauseous, bending down or getting up quickly also causing her dizzines.  She has been taking up to 12 tablets of ibuprofen each day, which only temporarily helps her headaches,  She received etonogestrel (NEXPLANON) 68 MG IMPL implant in July 14th 2016. She complains of 25 pound weight gain since last year.  MRI brain In June 2016: Enlarged partially empty sella. Enlarged optic nerve sheaths. Findings are non-specific but can be seen in association with idiopathic intracranial hypertension.No acute findings. No masses or hydrocephalus.   She had a fluoroscopy guided lumbar puncture in June 25 2015, with open pressure 33 cm water, she has developed post LP headaches, eventually had blood patch in June 30 2015, with immediate relief of her headaches  Spinal fluid testing showed total protein 43, glucose 53, WBC 20, RBC 1, 72% lymphocytes, 24% monocytes  She continue has mild blurry vision, occasionally double vision on extreme gaze to the left or right, low-grade bifrontal pressure headaches, she has tried Maxalt as needed, which has been helpful for moderate and severe  headaches. She is tolerating Topamax 100 mg twice a day well  She began to have recurrent headaches since September 2016, 40 pound weight gain over one year,  she contributed to [facility] implant that  was put in 2015, had weight gain, ws taken out in Sep, 2016   repeat LP  in Sep 29 2015, showed Open pressure is 26 cm H2O. she did not get post LP headache this time, but she had needle site low back pain, mild left hip pain since the last lumbar puncture in October 17,  Lumbar puncture January 19 2016, open pressure was 23 cm water, unfortunately she developed post LP headache, failed to improve by Fioricet, eventually had blood patch January 22 2016, she was evaluated by neurosurgeon Dr. Kathyrn Sheriff for potential shunt replacement, is referred to [facility].  MRI scan lumbar spine  10/20/16 showing prominent disc degenerative change at L5-S1 with broad-based disc osteophyte protrusion resulting in severe bilateral foraminal narrowing and likely encroachment on exiting nerve roots.   Update October 13 2017: I reviewed discharge summary on September 4 to 07-2017, she presented to the emergency room complains of acute onset left-sided numbness with associated severe migraine, MRI of the brain showed acute infarction in the right cerebellum, several small acute infarction in the right posterior frontal region, echocardiogram showed ejection fraction 55-60%, normal wall motion, CT angiogram showed patent artery, LDL 89, A1c 4.8, transcranial Doppler study which review PFO, lower extremity venous Doppler study was normal, she was started on Lipitor, and aspirin 81 mg,  TEE on August 22 2017, trivial MR, no thrombosis or mass,  CT angiogram of head and neck no significant abnormalities.  She presented  to the emergency room October 11 2017 for acute  onset right-sided headache, with residual numbness of left face and hand from her previous stroke on August 15 2017, she does not feel good, left leg  feel weak, might gave out on her.   Extensive laboratory evaluation in September 2018, mild elevated ESR 28, negative ANA, cardiolipin antibody, lupus anticoagulant, UDS, Sjogren antibody, anti-DNA antibody, ANCA, rheumatoid factor, C-reactive protein was slightly elevated 8.7  Repeat MRI of the brain October 11 2017, showed no acute abnormality, previous signal abnormality in September 2018 had resolved,  She has has daily headache all over, 9/10, now taking Fioricet twice a day 2 weeks, blurry vision  She is still taking topamax 159m bid, gabapentin 300/6079m nortriptyline 5078mhs.   REVIEW OF SYSTEMS: Full 14 system review of systems performed and notable only for those listed, all others are neg:     ALLERGIES: Allergies  Allergen Reactions  . Tape Rash    Cannot tolerate paper tape    HOME MEDICATIONS: Outpatient Medications Prior to Visit  Medication Sig Dispense Refill  . aspirin 81 MG chewable tablet Chew 1 tablet (81 mg total) by mouth daily. 30 tablet 0  . atorvastatin (LIPITOR) 10 MG tablet Take 1 tablet (10 mg total) by mouth daily at 6 PM. 90 tablet 2  . butalbital-acetaminophen-caffeine (FIORICET, ESGIC) 50-325-40 MG tablet Take 1-2 tablets by mouth every 6 (six) hours as needed for headache. 60 tablet 0  . gabapentin (NEURONTIN) 300 MG capsule Take 1 tablet (300 mg) two times daily. May increase dose to 600 mg twice daily as needed. (Patient taking differently: Take 300-600 mg by mouth See admin instructions. 300 mg in the morning and 600 mg at bedtime and may take an additional 300 mg in the afternoon as needed for nerve pain) 90 capsule 6  . Magnesium Oxide 400 (240 Mg) MG TABS Take 400 mg by mouth 2 (two) times daily. 60 tablet 11  . meloxicam (MOBIC) 15 MG tablet Take 1 tablet (15 mg total) by mouth daily. Please call [phone] scheduled appt. (Patient taking differently: Take 15 mg by mouth daily. ) 90 tablet 1  . metroNIDAZOLE (FLAGYL) 500 MG tablet Take 1  tablet (500 mg total) by mouth 2 (two) times daily with a meal. DO NOT CONSUME ALCOHOL WHILE TAKING THIS MEDICATION. (Patient taking differently: Take 500 mg by mouth 2 (two) times daily with a meal. FOR 7 DAYS and DO NOT CONSUME ALCOHOL WHILE TAKING THIS MEDICATION) 14 tablet 0  . norethindrone (MICRONOR,CAMILA,ERRIN) 0.35 MG tablet Take 1 tablet by mouth daily.    . nortriptyline (PAMELOR) 50 MG capsule Take 2 capsules (100 mg total) by mouth at bedtime. 60 capsule 3  . Riboflavin 100 MG TABS Take 1 tablet (100 mg total) by mouth 2 (two) times daily. 60 tablet 11  . rizatriptan (MAXALT-MLT) 10 MG disintegrating tablet Take 1 tablet (10 mg total) by mouth as needed. May repeat in 2 hours if needed (Patient taking differently: Take 10 mg by mouth once as needed for migraine. May repeat in 2 hours if needed) 27 tablet 3  . topiramate (TOPAMAX) 100 MG tablet TAKE 150MG BY MOUTH TWICE DAILY (Patient taking differently: Take 150 mg by mouth 2 (two) times daily. ) 270 tablet 0  . fluticasone (FLONASE) 50 MCG/ACT nasal spray Place 2 sprays into both nostrils daily. (Patient not taking: Reported on 10/11/2017) 16 g 6   No facility-administered medications prior to visit.  PAST MEDICAL HISTORY: Past Medical History:  Diagnosis Date  . DDD (degenerative disc disease), lumbar   . Headache   . Other disorders of optic nerve, not elsewhere classified, unspecified eye   . TTP (thrombotic thrombocytopenic purpura) ([place])     PAST SURGICAL HISTORY: Past Surgical History:  Procedure Laterality Date  . CESAREAN SECTION    . CHOLECYSTECTOMY    . TEE WITHOUT CARDIOVERSION N/A 08/22/2017   Procedure: TRANSESOPHAGEAL ECHOCARDIOGRAM (TEE);  Surgeon: Skeet Latch, MD;  [facility] ENDOSCOPY;  Service: Cardiovascular;  Laterality: N/A;  . WISDOM TOOTH EXTRACTION      FAMILY HISTORY: Family History  Problem Relation Age of Onset  . Diabetes Mother   . Hypertension Mother   . Ovarian cancer Mother          Hysterectomy  . Healthy Father   . Ovarian cancer Maternal Grandmother        Hysterectomy    SOCIAL HISTORY: Social History   Social History  . Marital status: Single    Spouse name: N/A  . Number of children: 1  . Years of education: In college   Occupational History  . Dispatcher    Social History Main Topics  . Smoking status: Current Every Day Smoker    Packs/day: 0.50  . Smokeless tobacco: Never Used  . Alcohol use No  . Drug use: No  . Sexual activity: Yes    Birth control/ protection: Pill   Other Topics Concern  . Not on file   Social History Narrative   Lives at home with her mother and daughter.   3-4 cups caffeine daily.   Left-handed.     PHYSICAL EXAM  Vitals:   10/13/17 1344  BP: 136/76  Pulse: (!) 112  Weight: 213 lb (96.6 kg)  Height: _0  (1.651 m)   Body mass index is 35.45 kg/m.    PHYSICAL EXAMNIATION:  Gen: NAD, conversant, well nourised, obese, well groomed                     Cardiovascular: Regular rate rhythm, no peripheral edema, warm, nontender. Eyes: Conjunctivae clear without exudates or hemorrhage Neck: Supple, no carotid bruits. Pulmonary: Clear to auscultation bilaterally   NEUROLOGICAL EXAM:  MENTAL STATUS: Speech:    Speech is normal; fluent and spontaneous with normal comprehension.  Cognition:     Orientation to time, place and person     Normal recent and remote memory     Normal Attention span and concentration     Normal Language, naming, repeating,spontaneous speech     Fund of knowledge   CRANIAL NERVES: CN II: Visual fields are full to confrontation. Fundoscopic exam is normal with sharp discs and no vascular changes. Pupils are round equal and briskly reactive to light. CN III, IV, VI: extraocular movement are normal. No ptosis. CN V: Facial sensation is intact to pinprick in all 3 divisions bilaterally. Corneal responses are intact.  CN VII: mild left lower face weakness. CN VIII: Hearing  is normal to rubbing fingers CN IX, X: Palate elevates symmetrically. Phonation is normal. CN XI: Head turning and shoulder shrug are intact CN XII: Tongue is midline with normal movements and no atrophy.  MOTOR: She has mild left arm and leg weakness  REFLEXES: Reflexes are 2+ and symmetric at the biceps, triceps, knees, and ankles. Plantar responses are flexor.  SENSORY: Intact to light touch, pinprick, positional and vibratory sensation are intact in fingers and toes.  COORDINATION: Rapid  alternating movements and fine finger movements are intact. There is no dysmetria on finger-to-nose and heel-knee-shin.    GAIT/STANCE: Mildly unsteady, dragging her left leg Romberg is absent.    DIAGNOSTIC DATA (LABS, IMAGING, TESTING)  ASSESSMENT AND PLAN  31 y.o. 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  Marcial Pacas, M.D. Ph.D.  [facility] [place], [place] [address] Phone: [phone] Fax:      [phone]

## 2017-10-13 NOTE — Telephone Encounter (Addendum)
Initiated PA for [place] with [place] [employer] via phone [phone]).  UJ#[id number]#[id number].  Pt HY#[id number].  Decision pending. The plan will only cover #9 per 30 days, if approved.  We will notify [facility] on [address] when decision is received.  Also, if the medication is approved they will need a new prescription sent reflecting the new quantity of #9.

## 2017-10-17 ENCOUNTER — Telehealth: Payer: Self-pay | Admitting: Neurology

## 2017-10-17 NOTE — Telephone Encounter (Signed)
I have reentered the order of MRV brain without contrast.

## 2017-10-17 NOTE — Telephone Encounter (Signed)
[facility]Patricia [facility] asked me if Dr. Terrace ArabiaYan could change the order to a MRV wo contrast.

## 2017-10-17 NOTE — Telephone Encounter (Signed)
Noted thank you

## 2017-10-17 NOTE — Telephone Encounter (Signed)
PA approved through 10/08/2018.  Pharmacy notified and will prepare rx for pick up.

## 2017-10-17 NOTE — Addendum Note (Signed)
Addended by: Levert FeinsteinYAN, Zyona Pettaway on: 10/17/2017 09:32 AM   Modules accepted: Orders

## 2017-10-19 ENCOUNTER — Telehealth: Payer: Self-pay | Admitting: Neurology

## 2017-10-19 MED ORDER — ALPRAZOLAM 1 MG PO TABS: 1.0000 mg | ORAL_TABLET | Freq: Every evening | ORAL | 0 refills | Status: DC | PRN

## 2017-10-19 NOTE — Telephone Encounter (Signed)
Ok to send Rx 

## 2017-10-19 NOTE — Telephone Encounter (Signed)
I have Rx xanax 1mg  as needed before MRI

## 2017-10-19 NOTE — Telephone Encounter (Signed)
I faxed Rx to pharmacy and made patient aware.

## 2017-10-19 NOTE — Telephone Encounter (Signed)
Pt is asking that something be called in for her upcoming MRV she said she thought it would be like a CT scan not like a MRI,  [id number] [place] [facility] [place] [place] - [address] [phone] (Phone) [phone] (Fax)

## 2017-10-19 NOTE — Addendum Note (Signed)
Addended by: Levert FeinsteinYAN, Larrissa Stivers on: 10/19/2017 04:19 PM   Modules accepted: Orders

## 2017-10-24 LAB — HYPERCOAGULABLE PANEL, COMPREHENSIVE
ACT. PRT C RESIST W/FV DEFIC.: 2.7 ratio
APTT: 27.6 s
AT III ACT/NOR PPP CHRO: 115 %
Anticardiolipin Ab, IgM: <10 [MPL'U]
Beta-2 Glycoprotein I, IgA: <10 SAU
Beta-2 Glycoprotein I, IgG: <10 SGU
Beta-2 Glycoprotein I, IgM: 20 SMU
DRVVT SCREEN SECONDS: 33.6 s
Factor VII Antigen**: 125 %
Factor VIII Activity: 75 %
HEXAGONAL PHOSPHOLIPID NEUTRAL: 9 s
Homocysteine: 11.6 umol/L
PROTEIN C AG/FVII AG RATIO: 0.7 ratio
Prot C Ag Act/Nor PPP Imm: 92 %
Prot S Ag Act/Nor PPP Imm: 61 %
Protein S Ag/FVII Ag Ratio**: 0.5 ratio

## 2017-10-27 ENCOUNTER — Ambulatory Visit
Admission: RE | Admit: 2017-10-27 | Discharge: 2017-10-27 | Disposition: A | Discharge status: Home / Self Care | Payer: Medicaid Other | Source: Ambulatory Visit | Attending: Neurology | Admitting: Neurology

## 2017-10-27 DIAGNOSIS — G932 Benign intracranial hypertension: Secondary | ICD-10-CM | POA: Diagnosis not present

## 2017-10-28 ENCOUNTER — Other Ambulatory Visit: Payer: Medicaid Other

## 2017-10-31 ENCOUNTER — Telehealth: Payer: Self-pay | Admitting: Neurology

## 2017-10-31 NOTE — Telephone Encounter (Signed)
Krista Maddox denied the coverage of botox due to not trying 3 different drug classes for her migraines. I called to make the patient aware, she requested to keep her apt for tomorrow as a follow up to see Dr. Terrace ArabiaYan. Just an FYI.

## 2017-11-01 ENCOUNTER — Telehealth: Payer: Self-pay | Admitting: Neurology

## 2017-11-01 ENCOUNTER — Encounter: Payer: Self-pay | Admitting: Neurology

## 2017-11-01 ENCOUNTER — Ambulatory Visit: Payer: Medicaid Other | Admitting: Neurology

## 2017-11-01 VITALS — BP 118/70 | HR 82 | Ht 65.0 in | Wt 213.5 lb

## 2017-11-01 DIAGNOSIS — G43709 Chronic migraine without aura, not intractable, without status migrainosus: Secondary | ICD-10-CM

## 2017-11-01 DIAGNOSIS — I6339 Cerebral infarction due to thrombosis of other cerebral artery: Secondary | ICD-10-CM

## 2017-11-01 DIAGNOSIS — G932 Benign intracranial hypertension: Secondary | ICD-10-CM

## 2017-11-01 DIAGNOSIS — IMO0002 Reserved for concepts with insufficient information to code with codable children: Secondary | ICD-10-CM

## 2017-11-01 MED ORDER — ONDANSETRON 4 MG PO TBDP: 4.0000 mg | ORAL_TABLET | Freq: Three times a day (TID) | ORAL | 11 refills | Status: DC | PRN

## 2017-11-01 MED ORDER — TIZANIDINE HCL 4 MG PO TABS: 4.0000 mg | ORAL_TABLET | Freq: Four times a day (QID) | ORAL | 11 refills | Status: DC | PRN

## 2017-11-01 NOTE — Telephone Encounter (Signed)
Pt. Needs Botox apt.

## 2017-11-01 NOTE — Progress Notes (Signed)
[facility]  PATIENT: Krista Maddox DOB: 03-Feb-1986   HISTORY OF PRESENT ILLNESS:[name] is a 31 years old right-handed female, seen in consultation by his ophthalmologist Dr. Alois Cliche for evaluation of bilateral papillary edema. Initial evaluation was on November 03 2015   I reviewed notes from Dr. Idolina Primer in May 28 2015, patient was seen for complains of blurry vision, especially seen distance, on examination, visual acuity right eye 20/25 minus, left 20/30 plus, bilateral papillary edema  She denied previous history of headache, since May 2016, she began to notice frequent headaches, bilateral retro-orbital area constant pressure, 8 out of 10 on daily basis, sometimes it would exacerbate to a more severe 10 out of 10 pounding headaches, with associated light noise sensitivity, nauseous, bending down or getting up quickly also causing her dizzines.  She has been taking up to 12 tablets of ibuprofen each day, which only temporarily helps her headaches,  She received etonogestrel (NEXPLANON) 68 MG IMPL implant in July 14th 2016. She complains of 25 pound weight gain since last year.  MRI brain In June 2016: Enlarged partially empty sella. Enlarged optic nerve sheaths. Findings are non-specific but can be seen in association with idiopathic intracranial hypertension.No acute findings. No masses or hydrocephalus.   She had a fluoroscopy guided lumbar puncture in June 25 2015, with open pressure 33 cm water, she has developed post LP headaches, eventually had blood patch in June 30 2015, with immediate relief of her headaches  Spinal fluid testing showed total protein 43, glucose 53, WBC 20, RBC 1, 72% lymphocytes, 24% monocytes  She continue has mild blurry vision, occasionally double vision on extreme gaze to the left or right, low-grade bifrontal pressure headaches, she has tried Maxalt as needed, which has been helpful for moderate and severe  headaches. She is tolerating Topamax 100 mg twice a day well  She began to have recurrent headaches since September 2016, 40 pound weight gain over one year,  she contributed to [facility] implant that  was put in 2015, had weight gain, ws taken out in Sep, 2016   repeat LP  in Sep 29 2015, showed Open pressure is 26 cm H2O. she did not get post LP headache this time, but she had needle site low back pain, mild left hip pain since the last lumbar puncture in October 17,  Lumbar puncture January 19 2016, open pressure was 23 cm water, unfortunately she developed post LP headache, failed to improve by Fioricet, eventually had blood patch January 22 2016, she was evaluated by neurosurgeon Dr. Kathyrn Sheriff for potential shunt replacement, is referred to [facility].  MRI scan lumbar spine  10/20/16 showing prominent disc degenerative change at L5-S1 with broad-based disc osteophyte protrusion resulting in severe bilateral foraminal narrowing and likely encroachment on exiting nerve roots.   Update October 13 2017: I reviewed discharge summary on September 4 to 07-2017, she presented to the emergency room complains of acute onset left-sided numbness with associated severe migraine, MRI of the brain showed acute infarction in the right cerebellum, several small acute infarction in the right posterior frontal region, echocardiogram showed ejection fraction 55-60%, normal wall motion, CT angiogram showed patent artery, LDL 89, A1c 4.8, transcranial Doppler study which review PFO, lower extremity venous Doppler study was normal, she was started on Lipitor, and aspirin 81 mg,  TEE on August 22 2017, trivial MR, no thrombosis or mass,  CT angiogram of head and neck no significant abnormalities.  She presented  to the emergency room October 11 2017 for acute  onset right-sided headache, with residual numbness of left face and hand from her previous stroke on August 15 2017, she does not feel good, left leg  feel weak, might gave out on her.   Extensive laboratory evaluation in September 2018, mild elevated ESR 28, negative ANA, cardiolipin antibody, lupus anticoagulant, UDS, Sjogren antibody, anti-DNA antibody, ANCA, rheumatoid factor, C-reactive protein was slightly elevated 8.7  Repeat MRI of the brain October 11 2017, showed no acute abnormality, previous signal abnormality in September 2018 had resolved,  She has has daily headache all over, 9/10, now taking Fioricet twice a day 2 weeks, blurry vision  She is still taking topamax '150mg'$  bid, gabapentin 300/'600mg'$ , nortriptyline '50mg'$  qhs.   UPDATE Nov 01 2017: She has been having daily headaches since September 2018, even before that, she was having more than 15 days in a month, each headache lasts more than 4 hours, moderate to severe,  Over the years, she has tried and failed Topamax, nortriptyline, propanolol, gabapentin, Effexor.   She is now taking Cambia as needed with moderate help, She complains of mildly blurry vision.  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   REVIEW OF SYSTEMS: Full 14 system review of systems performed and notable only for those listed, all others are neg:     ALLERGIES: Allergies  Allergen Reactions  . Tape Rash    Cannot tolerate paper tape    HOME MEDICATIONS: Outpatient Medications Prior to Visit  Medication Sig Dispense Refill  . ALPRAZolam (XANAX) 1 MG tablet Take 1 tablet (1 mg total) at bedtime as needed by mouth for anxiety. 3 tablet 0  . aspirin 81 MG chewable tablet Chew 1 tablet (81 mg total) by mouth daily. 30 tablet 0  . atorvastatin (LIPITOR) 10 MG tablet Take 1 tablet (10 mg total) by mouth daily at 6 PM. 90 tablet 2  . Diclofenac Potassium (CAMBIA) 50 MG PACK Take 50 mg by mouth daily as needed. 20 each 11  . gabapentin (NEURONTIN) 300 MG capsule Take 1 tablet (300 mg) two times daily. May increase dose to 600 mg twice daily as needed. (Patient taking differently: Take 300-600  mg by mouth See admin instructions. 300 mg in the morning and 600 mg at bedtime and may take an additional 300 mg in the afternoon as needed for nerve pain) 90 capsule 6  . Magnesium Oxide 400 (240 Mg) MG TABS Take 400 mg by mouth 2 (two) times daily. 60 tablet 11  . meloxicam (MOBIC) 15 MG tablet Take 1 tablet (15 mg total) by mouth daily. Please call [phone] to scheduled appt. (Patient taking differently: Take 15 mg by mouth daily. ) 90 tablet 1  . metroNIDAZOLE (FLAGYL) 500 MG tablet Take 1 tablet (500 mg total) by mouth 2 (two) times daily with a meal. DO NOT CONSUME ALCOHOL WHILE TAKING THIS MEDICATION. (Patient taking differently: Take 500 mg by mouth 2 (two) times daily with a meal. FOR 7 DAYS and DO NOT CONSUME ALCOHOL WHILE TAKING THIS MEDICATION) 14 tablet 0  . norethindrone (MICRONOR,CAMILA,ERRIN) 0.35 MG tablet Take 1 tablet by mouth daily.    . nortriptyline (PAMELOR) 50 MG capsule Take 2 capsules (100 mg total) by mouth at bedtime. 60 capsule 3  . ondansetron (ZOFRAN ODT) 4 MG disintegrating tablet Take 1 tablet (4 mg total) by mouth every 8 (eight) hours as needed for nausea or vomiting. 20 tablet 0  . propranolol (INDERAL) 80 MG  tablet Take 1 tablet (80 mg total) by mouth 2 (two) times daily. 60 tablet 11  . Riboflavin 100 MG TABS Take 1 tablet (100 mg total) by mouth 2 (two) times daily. 60 tablet 11  . tiZANidine (ZANAFLEX) 4 MG tablet Take 1 tablet (4 mg total) by mouth every 6 (six) hours as needed for muscle spasms. 30 tablet 0  . topiramate (TOPAMAX) 100 MG tablet TAKE '150MG'$  BY MOUTH TWICE DAILY (Patient taking differently: Take 150 mg by mouth 2 (two) times daily. ) 270 tablet 0  . venlafaxine XR (EFFEXOR XR) 37.5 MG 24 hr capsule Take 1 capsule (37.5 mg total) by mouth daily with breakfast. 30 capsule 11   No facility-administered medications prior to visit.     PAST MEDICAL HISTORY: Past Medical History:  Diagnosis Date  . DDD (degenerative disc disease), lumbar   .  Headache   . Other disorders of optic nerve, not elsewhere classified, unspecified eye   . TTP (thrombotic thrombocytopenic purpura) ([place])     PAST SURGICAL HISTORY: Past Surgical History:  Procedure Laterality Date  . CESAREAN SECTION    . CHOLECYSTECTOMY    . TEE WITHOUT CARDIOVERSION N/A 08/22/2017   Procedure: TRANSESOPHAGEAL ECHOCARDIOGRAM (TEE);  Surgeon: Skeet Latch, MD;  [facility] ENDOSCOPY;  Service: Cardiovascular;  Laterality: N/A;  . WISDOM TOOTH EXTRACTION      FAMILY HISTORY: Family History  Problem Relation Age of Onset  . Diabetes Mother   . Hypertension Mother   . Ovarian cancer Mother        Hysterectomy  . Healthy Father   . Ovarian cancer Maternal Grandmother        Hysterectomy    SOCIAL HISTORY: Social History   Socioeconomic History  . Marital status: Single    Spouse name: Not on file  . Number of children: 1  . Years of education: In college  . Highest education level: Not on file  Social Needs  . Financial resource strain: Not on file  . Food insecurity - worry: Not on file  . Food insecurity - inability: Not on file  . Transportation needs - medical: Not on file  . Transportation needs - non-medical: Not on file  Occupational History  . Occupation: Counsellor  Tobacco Use  . Smoking status: Current Every Day Smoker    Packs/day: 0.50  . Smokeless tobacco: Never Used  Substance and Sexual Activity  . Alcohol use: No    Alcohol/week: 0.0 oz  . Drug use: No  . Sexual activity: Yes    Birth control/protection: Pill  Other Topics Concern  . Not on file  Social History Narrative   Lives at home with her mother and daughter.   3-4 cups caffeine daily.   Left-handed.     PHYSICAL EXAM  Vitals:   11/01/17 1553  BP: 118/70  Pulse: 82  Weight: 213 lb 8 oz (96.8 kg)  Height: '5\' 5"'$  (1.651 m)   Body mass index is 35.53 kg/m.    PHYSICAL EXAMNIATION:  Gen: NAD, conversant, well nourised, obese, well groomed                      Cardiovascular: Regular rate rhythm, no peripheral edema, warm, nontender. Eyes: Conjunctivae clear without exudates or hemorrhage Neck: Supple, no carotid bruits. Pulmonary: Clear to auscultation bilaterally   NEUROLOGICAL EXAM:  MENTAL STATUS: Speech:    Speech is normal; fluent and spontaneous with normal comprehension.  Cognition:     Orientation  to time, place and person     Normal recent and remote memory     Normal Attention span and concentration     Normal Language, naming, repeating,spontaneous speech     Fund of knowledge   CRANIAL NERVES: CN II: Visual fields are full to confrontation. Fundoscopic exam is normal with sharp discs and no vascular changes. Pupils are round equal and briskly reactive to light. CN III, IV, VI: extraocular movement are normal. No ptosis. CN V: Facial sensation is intact to pinprick in all 3 divisions bilaterally. Corneal responses are intact.  CN VII: mild left lower face weakness. CN VIII: Hearing is normal to rubbing fingers CN IX, X: Palate elevates symmetrically. Phonation is normal. CN XI: Head turning and shoulder shrug are intact CN XII: Tongue is midline with normal movements and no atrophy.  MOTOR: She has mild left arm and leg weakness  REFLEXES: Reflexes are 2+ and symmetric at the biceps, triceps, knees, and ankles. Plantar responses are flexor.  SENSORY: Intact to light touch, pinprick, positional and vibratory sensation are intact in fingers and toes.  COORDINATION: Rapid alternating movements and fine finger movements are intact. There is no dysmetria on finger-to-nose and heel-knee-shin.    GAIT/STANCE: Mildly unsteady, dragging her left leg Romberg is absent.    DIAGNOSTIC DATA (LABS, IMAGING, TESTING)  ASSESSMENT AND PLAN  31 y.o. 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  BOTOX injection was performed according to protocol by [employer]. 100 units of BOTOX was dissolved into 2 cc NS (used sample)      Extra 45 units were injected at right parietal, temporal region.  Patient tolerate the injection well. Will return for repeat injection in 3 months.     Marcial Pacas, M.D. Ph.D.  [facility] [place], [place] [address] Phone: [phone] Fax:      [phone]

## 2017-11-01 NOTE — Progress Notes (Signed)
**  Botox 100 units x 2 vials, NDC 5409-8119-140023-1145-01, Lot [id number], Exp 02/2020, samples.//mck,rn**

## 2017-11-01 NOTE — Patient Instructions (Signed)
You may take  Camiba  Along with Zofran for nausea Tizanidine for muscle relaxant Tylenol Benadryl as needed for severe headaches,

## 2017-11-02 ENCOUNTER — Telehealth: Payer: Self-pay | Admitting: Neurology

## 2017-11-02 ENCOUNTER — Other Ambulatory Visit: Payer: Self-pay | Admitting: *Deleted

## 2017-11-02 DIAGNOSIS — I639 Cerebral infarction, unspecified: Secondary | ICD-10-CM

## 2017-11-02 NOTE — Telephone Encounter (Signed)
Left message requesting return call from PagelandGeisla.

## 2017-11-02 NOTE — Telephone Encounter (Signed)
Toy CookeyGeisla with [facility] called and wants clarification on Dr. Catalina GravelYans orders for the patient in her referral. Please call and advise.

## 2017-11-02 NOTE — Telephone Encounter (Signed)
Spoke to [place] at cardiology - they need an order placed in Epic for the 30-day event monitor.

## 2017-11-18 ENCOUNTER — Other Ambulatory Visit: Payer: Self-pay | Admitting: Neurology

## 2017-11-18 DIAGNOSIS — I639 Cerebral infarction, unspecified: Secondary | ICD-10-CM

## 2017-11-18 DIAGNOSIS — I4891 Unspecified atrial fibrillation: Secondary | ICD-10-CM

## 2017-11-28 ENCOUNTER — Ambulatory Visit (INDEPENDENT_AMBULATORY_CARE_PROVIDER_SITE_OTHER): Payer: Medicaid Other

## 2017-11-28 DIAGNOSIS — I4891 Unspecified atrial fibrillation: Secondary | ICD-10-CM

## 2017-11-28 DIAGNOSIS — I639 Cerebral infarction, unspecified: Secondary | ICD-10-CM

## 2017-12-01 ENCOUNTER — Telehealth: Payer: Self-pay | Admitting: Neurology

## 2017-12-01 ENCOUNTER — Other Ambulatory Visit: Payer: Self-pay | Admitting: Neurology

## 2017-12-01 NOTE — Telephone Encounter (Signed)
I have reviewed ophthalmology evaluation [facility] dated November 15, 2017, normal appearance of bilateral optic disc, vessels of normal caliber, normal appearance of optic nerve with healthy pink rim,  no edema.

## 2017-12-05 ENCOUNTER — Other Ambulatory Visit: Payer: Self-pay | Admitting: Neurology

## 2017-12-09 ENCOUNTER — Telehealth: Payer: Self-pay | Admitting: Neurology

## 2017-12-09 ENCOUNTER — Other Ambulatory Visit: Payer: Self-pay | Admitting: Neurology

## 2017-12-09 MED ORDER — NORTRIPTYLINE HCL 50 MG PO CAPS: ORAL_CAPSULE | ORAL | 0 refills | Status: DC

## 2017-12-14 NOTE — Telephone Encounter (Signed)
error 

## 2018-01-02 ENCOUNTER — Telehealth: Payer: Self-pay | Admitting: *Deleted

## 2018-01-02 NOTE — Telephone Encounter (Signed)
Spoke to patient she is aware of the results ?

## 2018-01-02 NOTE — Telephone Encounter (Signed)
-----   Message from Levert FeinsteinYijun Yan, MD sent at 01/02/2018  3:08 PM EST ----- Please call patient for no significant abnormality on cardiac monitoring

## 2018-01-04 ENCOUNTER — Ambulatory Visit: Payer: Medicaid Other | Admitting: Family Medicine

## 2018-01-04 ENCOUNTER — Encounter: Payer: Self-pay | Admitting: Family Medicine

## 2018-01-04 VITALS — BP 120/64 | HR 84 | Temp 97.9°F | Resp 16 | Ht 65.0 in | Wt 215.0 lb

## 2018-01-04 DIAGNOSIS — R29898 Other symptoms and signs involving the musculoskeletal system: Secondary | ICD-10-CM

## 2018-01-04 NOTE — Progress Notes (Addendum)
Patient ID: Krista FickHeather L Delamar, female    DOB: [birth year], 32 y.o.   MRN: [id number]  PCP: Bing NeighborsHarris, Betty Brooks S, FNP  Chief Complaint  Patient presents with  . Follow-up    left hand weakness     Subjective:  HPI Krista Maddox is a 32 y.o. female with history of CVA with residual left sided weakness, presents for evaluation persistent left hand weakness.  Herbert SetaHeather reports that she has been enrolled in college and had to stop going to school as she has profound weakness of her left hand.  She is unable to write or hold objects due to weakness and numbness.  After her stroke in September she did not have insurance and was unable to follow-up with stroke rehabilitation at that time.  She reports that she has to focus focus and concentrate to perform any left hand movements.  She has difficulty gripping and holding onto objects in her left hand.  She has been told by family members that she also drags her left leg, although denies any loss of sensation of her leg.  She does complain of some left-sided facial numbness although denies trouble chewing or tasting foods.  She has had some episodes of dysphasia since CVA. Denies paraesthesia of her left hand. Continues to experience frequent headaches which [facility].  Herbert SetaHeather denies any shortness of breath, cough, chest pain, new dizziness or new weakness. Social History   Socioeconomic History  . Marital status: Single    Spouse name: Not on file  . Number of children: 1  . Years of education: In college  . Highest education level: Not on file  Social Needs  . Financial resource strain: Not on file  . Food insecurity - worry: Not on file  . Food insecurity - inability: Not on file  . Transportation needs - medical: Not on file  . Transportation needs - non-medical: Not on file  Occupational History  . Occupation: Science writerDispatcher  Tobacco Use  . Smoking status: Current Every Day Smoker    Packs/day: 0.50  .  Smokeless tobacco: Never Used  Substance and Sexual Activity  . Alcohol use: No    Alcohol/week: 0.0 oz  . Drug use: No  . Sexual activity: Yes    Birth control/protection: Pill  Other Topics Concern  . Not on file  Social History Narrative   Lives at home with her mother and daughter.   3-4 cups caffeine daily.   Left-handed.    Family History  Problem Relation Age of Onset  . Diabetes Mother   . Hypertension Mother   . Ovarian cancer Mother        Hysterectomy  . Healthy Father   . Ovarian cancer Maternal Grandmother        Hysterectomy   Review of Systems  Respiratory: Negative.   Cardiovascular: Negative.   Neurological: Positive for weakness and headaches. Negative for dizziness.       Left hand weakness  Psychiatric/Behavioral: Negative for decreased concentration, sleep disturbance and suicidal ideas. The patient is not nervous/anxious.     Patient Active Problem List   Diagnosis Date Noted  . Cerebrovascular accident (HCC) 11/02/2017  . 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 History of TTP (thrombotic thrombocytopenic purpura)   . 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 Numbness on left side 08/16/2017  . Acute left lumbar radiculopathy 10/13/2016  . Chronic migraine 06/01/2016  .  Pseudotumor cerebri 11/03/2015  . DENTAL PAIN 06/09/2010  . HEADACHE, TENSION 02/04/2010  . SCABIES 01/13/2010  . FOOT PAIN, LEFT 01/13/2010  . CERUMEN IMPACTION, BILATERAL 11/24/2009  . PSORIASIS 11/24/2009  . Smoker 10/15/2009  . 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 PYELONEPHRITIS 10/01/2009  . THROMBOTIC THROMBOCYTOPENIC PURPURA 12/13/2005    Allergies  Allergen Reactions  . Tape Rash    Cannot tolerate paper tape    Prior to Admission medications   Medication Sig Start Date End Date Taking? Authorizing Provider  ALPRAZolam Prudy Feeler) 1 MG tablet Take 1 tablet (1 mg total) at bedtime as needed by mouth  for anxiety. 10/19/17  Yes Levert Feinstein, MD  aspirin 81 MG chewable tablet Chew 1 tablet (81 mg total) by mouth daily. 08/20/17  Yes Calvert Cantor, MD  atorvastatin (LIPITOR) 10 MG tablet Take 1 tablet (10 mg total) by mouth daily at 6 PM. 09/06/17  Yes Bing Neighbors, FNP  Diclofenac Potassium (CAMBIA) 50 MG PACK Take 50 mg by mouth daily as needed. 10/13/17  Yes Levert Feinstein, MD  gabapentin (NEURONTIN) 300 MG capsule Take 1 tablet (300 mg) two times daily. May increase dose to 600 mg twice daily as needed. Patient taking differently: Take 300-600 mg by mouth See admin instructions. 300 mg in the morning and 600 mg at bedtime and may take an additional 300 mg in the afternoon as needed for nerve pain 09/08/17  Yes Bing Neighbors, FNP  Magnesium Oxide 400 (240 Mg) MG TABS Take 400 mg by mouth 2 (two) times daily. 06/01/16  Yes Levert Feinstein, MD  meloxicam (MOBIC) 15 MG tablet TAKE 1 TABLET BY MOUTH ONCE DAILY WITH FOOD 12/01/17  Yes Levert Feinstein, MD  metroNIDAZOLE (FLAGYL) 500 MG tablet Take 1 tablet (500 mg total) by mouth 2 (two) times daily with a meal. DO NOT CONSUME ALCOHOL WHILE TAKING THIS MEDICATION. Patient taking differently: Take 500 mg by mouth 2 (two) times daily with a meal. FOR 7 DAYS and DO NOT CONSUME ALCOHOL WHILE TAKING THIS MEDICATION 10/06/17  Yes Bing Neighbors, FNP  norethindrone (MICRONOR,CAMILA,ERRIN) 0.35 MG tablet Take 1 tablet by mouth daily.   Yes [provider]  nortriptyline (PAMELOR) 50 MG capsule TAKE 1 CAPSULE BY MOUTH EVERY DAY AT BEDTIME 12/09/17  Yes Levert Feinstein, MD  ondansetron (ZOFRAN ODT) 4 MG disintegrating tablet Take 1 tablet (4 mg total) by mouth every 8 (eight) hours as needed for nausea or vomiting. 11/01/17  Yes Levert Feinstein, MD  propranolol (INDERAL) 80 MG tablet Take 1 tablet (80 mg total) by mouth 2 (two) times daily. 10/13/17  Yes Levert Feinstein, MD  Riboflavin 100 MG TABS Take 1 tablet (100 mg total) by mouth 2 (two) times daily. 06/01/16  Yes Levert Feinstein, MD  tiZANidine (ZANAFLEX) 4 MG tablet Take 1 tablet (4 mg total) by mouth every 6 (six) hours as needed for muscle spasms. 11/01/17  Yes Levert Feinstein, MD  topiramate (TOPAMAX) 100 MG tablet TAKE 150MG  BY MOUTH TWICE DAILY Patient taking differently: Take 150 mg by mouth 2 (two) times daily.  08/29/17  Yes Levert Feinstein, MD  venlafaxine XR (EFFEXOR XR) 37.5 MG 24 hr capsule Take 1 capsule (37.5 mg total) by mouth daily with breakfast. 10/13/17  Yes Levert Feinstein, MD  tiZANidine (ZANAFLEX) 4 MG tablet Take 1 tablet (4 mg total) by mouth every 6 (six) hours as needed for muscle spasms. Patient not taking: Reported on 01/04/2018 10/13/17   Levert Feinstein, MD  Past Medical, Surgical Family and Social History reviewed and updated.    Objective:   Today's Vitals   01/04/18 1431  BP: 120/64  Pulse: 84  Resp: 16  Temp: 97.9 F (36.6 C)  TempSrc: Oral  SpO2: 100%  Weight: 215 lb (97.5 kg)  Height: 5\' 5"  (1.651 m)    Wt Readings from Last 3 Encounters:  01/04/18 215 lb (97.5 kg)  11/01/17 213 lb 8 oz (96.8 kg)  10/13/17 213 lb (96.6 kg)    Physical Exam  Constitutional: She is oriented to person, place, and time. She appears well-developed and well-nourished.  HENT:  Head: Normocephalic and atraumatic.  Eyes: Conjunctivae and EOM are normal. Pupils are equal, round, and reactive to light.  Neck: Normal range of motion. Neck supple. No thyromegaly present.  Cardiovascular: Normal rate, regular rhythm, normal heart sounds and intact distal pulses.  Pulmonary/Chest: Effort normal and breath sounds normal.  Musculoskeletal: Normal range of motion.  Lymphadenopathy:    She has no cervical adenopathy.  Neurological: She is alert and oriented to person, place, and time.  Left hand is positive abnormal grip reflex, abnormal digital extension, abnormal wrist flexion, abnormal inversion or eversion.  Skin: Skin is warm and dry.  Psychiatric: She has a normal mood and affect. Her behavior is  normal. Judgment and thought content normal.   Assessment & Plan:  1. Hand weakness 2. Hand dysfunction Left hand immobility, weakness, impaired movement has remained present since CVA in September.  Patient has recently withdrawn from college as she is unable to perform routine activities with her left hand.  I recommend physical therapy evaluation and treatment plan in efforts to improve mobility and strengthen left hand.  Also advised patient to discuss any additional recommendations with neurologist during her upcoming appointment in March.  RTC: As needed.      Godfrey Pick. Tiburcio Pea, MSN, FNP-C [facility]  [address] [place], [place] [address] [phone]

## 2018-01-05 NOTE — Addendum Note (Signed)
Addended by: Bing NeighborsHARRIS, Levette Paulick S on: 01/05/2018 10:53 AM   Modules accepted: Orders

## 2018-01-18 ENCOUNTER — Emergency Department (HOSPITAL_COMMUNITY): Payer: Medicaid Other

## 2018-01-18 ENCOUNTER — Emergency Department (HOSPITAL_COMMUNITY)
Admission: EM | Admit: 2018-01-18 | Discharge: 2018-01-18 | Disposition: A | Discharge status: Home / Self Care | Payer: Medicaid Other | Attending: Emergency Medicine | Admitting: Emergency Medicine

## 2018-01-18 ENCOUNTER — Encounter (HOSPITAL_COMMUNITY): Payer: Self-pay | Admitting: Emergency Medicine

## 2018-01-18 DIAGNOSIS — Z79899 Other long term (current) drug therapy: Secondary | ICD-10-CM | POA: Diagnosis not present

## 2018-01-18 DIAGNOSIS — R51 Headache: Secondary | ICD-10-CM | POA: Insufficient documentation

## 2018-01-18 DIAGNOSIS — R531 Weakness: Secondary | ICD-10-CM

## 2018-01-18 DIAGNOSIS — Z7982 Long term (current) use of aspirin: Secondary | ICD-10-CM | POA: Insufficient documentation

## 2018-01-18 DIAGNOSIS — R519 Headache, unspecified: Secondary | ICD-10-CM

## 2018-01-18 DIAGNOSIS — Z862 Personal history of diseases of the blood and blood-forming organs and certain disorders involving the immune mechanism: Secondary | ICD-10-CM | POA: Diagnosis not present

## 2018-01-18 DIAGNOSIS — F17200 Nicotine dependence, unspecified, uncomplicated: Secondary | ICD-10-CM | POA: Insufficient documentation

## 2018-01-18 DIAGNOSIS — Z8673 Personal history of transient ischemic attack (TIA), and cerebral infarction without residual deficits: Secondary | ICD-10-CM | POA: Insufficient documentation

## 2018-01-18 LAB — PROTIME-INR
INR: 0.97
Prothrombin Time: 12.8 seconds (ref 11.4–15.2)

## 2018-01-18 LAB — COMPREHENSIVE METABOLIC PANEL
ALK PHOS: 60 U/L (ref 38–126)
ALT: 13 U/L — AB (ref 14–54)
AST: 14 U/L — ABNORMAL LOW (ref 15–41)
Albumin: 3.6 g/dL (ref 3.5–5.0)
Anion gap: 10 (ref 5–15)
BUN: 13 mg/dL (ref 6–20)
CALCIUM: 8.9 mg/dL (ref 8.9–10.3)
CHLORIDE: 111 mmol/L (ref 101–111)
CO2: 19 mmol/L — ABNORMAL LOW (ref 22–32)
CREATININE: 0.81 mg/dL (ref 0.44–1.00)
Glucose, Bld: 91 mg/dL (ref 65–99)
Potassium: 3.9 mmol/L (ref 3.5–5.1)
Sodium: 140 mmol/L (ref 135–145)
TOTAL PROTEIN: 6.8 g/dL (ref 6.5–8.1)
Total Bilirubin: <0.1 mg/dL — ABNORMAL LOW (ref 0.3–1.2)

## 2018-01-18 LAB — DIFFERENTIAL
BASOS PCT: 0 %
Basophils Absolute: 0 10*3/uL (ref 0.0–0.1)
Eosinophils Absolute: 0.3 10*3/uL (ref 0.0–0.7)
Eosinophils Relative: 3 %
Lymphocytes Relative: 36 %
Lymphs Abs: 3.7 10*3/uL (ref 0.7–4.0)
MONO ABS: 0.4 10*3/uL (ref 0.1–1.0)
MONOS PCT: 4 %
NEUTROS ABS: 5.8 10*3/uL (ref 1.7–7.7)
Neutrophils Relative %: 57 %

## 2018-01-18 LAB — I-STAT CHEM 8, ED
BUN: 14 mg/dL (ref 6–20)
CALCIUM ION: 1.16 mmol/L (ref 1.15–1.40)
Chloride: 107 mmol/L (ref 101–111)
Creatinine, Ser: 0.7 mg/dL (ref 0.44–1.00)
Glucose, Bld: 85 mg/dL (ref 65–99)
HCT: 40 % (ref 36.0–46.0)
HEMOGLOBIN: 13.6 g/dL (ref 12.0–15.0)
Potassium: 3.9 mmol/L (ref 3.5–5.1)
Sodium: 142 mmol/L (ref 135–145)
TCO2: 21 mmol/L — AB (ref 22–32)

## 2018-01-18 LAB — I-STAT BETA HCG BLOOD, ED (MC, WL, AP ONLY): I-stat hCG, quantitative: <5 m[IU]/mL (ref <5)

## 2018-01-18 LAB — I-STAT TROPONIN, ED: TROPONIN I, POC: 0 ng/mL (ref 0.00–0.08)

## 2018-01-18 LAB — CBC
HEMATOCRIT: 39.8 % (ref 36.0–46.0)
Hemoglobin: 13.2 g/dL (ref 12.0–15.0)
MCH: 31.6 pg (ref 26.0–34.0)
MCHC: 33.2 g/dL (ref 30.0–36.0)
MCV: 95.2 fL (ref 78.0–100.0)
Platelets: 239 10*3/uL (ref 150–400)
RBC: 4.18 MIL/uL (ref 3.87–5.11)
RDW: 13.2 % (ref 11.5–15.5)
WBC: 10.3 10*3/uL (ref 4.0–10.5)

## 2018-01-18 LAB — ETHANOL

## 2018-01-18 LAB — APTT: aPTT: 29 seconds (ref 24–36)

## 2018-01-18 MED ORDER — IOPAMIDOL (ISOVUE-370) INJECTION 76%
50.0000 mL | Freq: Once | INTRAVENOUS | Status: AC | PRN
  Administered 2018-01-18: 50 mL via INTRAVENOUS

## 2018-01-18 MED ORDER — LORAZEPAM 2 MG/ML IJ SOLN
1.0000 mg | Freq: Once | INTRAMUSCULAR | Status: AC
  Administered 2018-01-18: 1 mg via INTRAVENOUS
  Filled 2018-01-18: qty 1

## 2018-01-18 NOTE — ED Provider Notes (Signed)
[facility] EMERGENCY DEPARTMENT Provider Note   CSN: [id number] Arrival date & time: 01/18/18  1708     History   Chief Complaint Chief Complaint  Patient presents with  . Stroke Symptoms    HPI KINDELL STRADA is a 32 y.o. female.  The history is provided by the patient and medical records. No language interpreter was used.  Neurologic Problem  This is a new problem. The current episode started 1 to 2 hours ago. The problem occurs constantly. The problem has not changed since onset.Associated symptoms include headaches. Pertinent negatives include no chest pain, no abdominal pain and no shortness of breath. Nothing aggravates the symptoms. Nothing relieves the symptoms. She has tried nothing for the symptoms. The treatment provided no relief.    Past Medical History:  Diagnosis Date  . DDD (degenerative disc disease), lumbar   . Headache   . Other disorders of optic nerve, not elsewhere classified, unspecified eye   . TTP (thrombotic thrombocytopenic purpura) [facility])     Patient Active Problem List   Diagnosis Date Noted  . Cerebrovascular accident (HCC) 11/02/2017  . 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 History of TTP (thrombotic thrombocytopenic purpura)   . 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 Numbness on left side 08/16/2017  . Acute left lumbar radiculopathy 10/13/2016  . Chronic migraine 06/01/2016  . Pseudotumor cerebri 11/03/2015  . DENTAL PAIN 06/09/2010  . HEADACHE, TENSION 02/04/2010  . SCABIES 01/13/2010  . FOOT PAIN, LEFT 01/13/2010  . CERUMEN IMPACTION, BILATERAL 11/24/2009  . PSORIASIS 11/24/2009  . Smoker 10/15/2009  . 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 PYELONEPHRITIS 10/01/2009  . 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 CESAREAN SECTION    . CHOLECYSTECTOMY    . TEE WITHOUT CARDIOVERSION  N/A 08/22/2017   Procedure: TRANSESOPHAGEAL ECHOCARDIOGRAM (TEE);  Surgeon: Chilton Si, MD;  Location: [facility] ENDOSCOPY;  Service: Cardiovascular;  Laterality: N/A;  . WISDOM TOOTH EXTRACTION      OB History    No data available       Home Medications    Prior to Admission medications   Medication Sig Start Date End Date Taking? Authorizing Provider  ALPRAZolam Prudy Feeler) 1 MG tablet Take 1 tablet (1 mg total) at bedtime as needed by mouth for anxiety. 10/19/17   Levert Feinstein, MD  aspirin 81 MG chewable tablet Chew 1 tablet (81 mg total) by mouth daily. 08/20/17   Calvert Cantor, MD  atorvastatin (LIPITOR) 10 MG tablet Take 1 tablet (10 mg total) by mouth daily at 6 PM. 09/06/17   Bing Neighbors, FNP  Diclofenac Potassium (CAMBIA) 50 MG PACK Take 50 mg by mouth daily as needed. 10/13/17   Levert Feinstein, MD  gabapentin (NEURONTIN) 300 MG capsule Take 1 tablet (300 mg) two times daily. May increase dose to 600 mg twice daily as needed. Patient taking differently: Take 300-600 mg by mouth See admin instructions. 300 mg in the morning and 600 mg at bedtime and may take an additional 300 mg in the afternoon as needed for nerve pain 09/08/17   Bing Neighbors, FNP  Magnesium Oxide 400 (240 Mg) MG TABS Take 400 mg by mouth 2 (two) times daily. 06/01/16   Levert Feinstein, MD  meloxicam (MOBIC) 15 MG tablet TAKE 1 TABLET BY MOUTH ONCE DAILY WITH FOOD 12/01/17   Levert Feinstein, MD  metroNIDAZOLE (FLAGYL) 500 MG tablet  Take 1 tablet (500 mg total) by mouth 2 (two) times daily with a meal. DO NOT CONSUME ALCOHOL WHILE TAKING THIS MEDICATION. Patient taking differently: Take 500 mg by mouth 2 (two) times daily with a meal. FOR 7 DAYS and DO NOT CONSUME ALCOHOL WHILE TAKING THIS MEDICATION 10/06/17   Bing Neighbors, FNP  norethindrone (MICRONOR,CAMILA,ERRIN) 0.35 MG tablet Take 1 tablet by mouth daily.    [provider]  nortriptyline (PAMELOR) 50 MG capsule TAKE 1 CAPSULE BY MOUTH EVERY DAY AT BEDTIME  12/09/17   Levert Feinstein, MD  ondansetron (ZOFRAN ODT) 4 MG disintegrating tablet Take 1 tablet (4 mg total) by mouth every 8 (eight) hours as needed for nausea or vomiting. 11/01/17   Levert Feinstein, MD  propranolol (INDERAL) 80 MG tablet Take 1 tablet (80 mg total) by mouth 2 (two) times daily. 10/13/17   Levert Feinstein, MD  Riboflavin 100 MG TABS Take 1 tablet (100 mg total) by mouth 2 (two) times daily. 06/01/16   Levert Feinstein, MD  tiZANidine (ZANAFLEX) 4 MG tablet Take 1 tablet (4 mg total) by mouth every 6 (six) hours as needed for muscle spasms. Patient not taking: Reported on 01/04/2018 10/13/17   Levert Feinstein, MD  tiZANidine (ZANAFLEX) 4 MG tablet Take 1 tablet (4 mg total) by mouth every 6 (six) hours as needed for muscle spasms. 11/01/17   Levert Feinstein, MD  topiramate (TOPAMAX) 100 MG tablet TAKE 150MG  BY MOUTH TWICE DAILY Patient taking differently: Take 150 mg by mouth 2 (two) times daily.  08/29/17   Levert Feinstein, MD  venlafaxine XR (EFFEXOR XR) 37.5 MG 24 hr capsule Take 1 capsule (37.5 mg total) by mouth daily with breakfast. 10/13/17   Levert Feinstein, MD    Family History Family History  Problem Relation Age of Onset  . Diabetes Mother   . Hypertension Mother   . Ovarian cancer Mother        Hysterectomy  . Healthy Father   . Ovarian cancer Maternal Grandmother        Hysterectomy    Social History Social History   Tobacco Use  . Smoking status: Current Every Day Smoker    Packs/day: 0.50  . Smokeless tobacco: Never Used  Substance Use Topics  . Alcohol use: No    Alcohol/week: 0.0 oz  . Drug use: No     Allergies   Tape   Review of Systems Review of Systems  Constitutional: Negative for chills, diaphoresis and fatigue.  Eyes: Negative for visual disturbance.  Respiratory: Negative for cough, chest tightness and shortness of breath.   Cardiovascular: Negative for chest pain.  Gastrointestinal: Negative for abdominal pain, diarrhea, nausea and vomiting.  Genitourinary: Negative  for dysuria.  Musculoskeletal: Negative for back pain, neck pain and neck stiffness.  Skin: Negative for rash and wound.  Neurological: Positive for weakness, numbness and headaches. Negative for dizziness, speech difficulty and light-headedness.  Psychiatric/Behavioral: Negative for agitation.  All other systems reviewed and are negative.    Physical Exam Updated Vital Signs BP 105/65   Pulse 63   Temp 98.6 F (37 C)   Resp 18   LMP 01/16/2018   SpO2 100%   Physical Exam  Constitutional: She is oriented to person, place, and time. She appears well-developed and well-nourished. No distress.  HENT:  Head: Normocephalic.  Mouth/Throat: Oropharynx is clear and moist. No oropharyngeal exudate.  Eyes: Conjunctivae and EOM are normal. Pupils are equal, round, and reactive to light.  Neck: Normal  range of motion. No JVD present.  Cardiovascular: Normal rate and intact distal pulses.  No murmur heard. Pulmonary/Chest: Effort normal and breath sounds normal. No stridor. No respiratory distress. She exhibits no tenderness.  Abdominal: She exhibits no distension. There is no tenderness.  Musculoskeletal: She exhibits no edema.  Neurological: She is alert and oriented to person, place, and time. She is not disoriented. She displays no tremor. A sensory deficit is present. No cranial nerve deficit. She exhibits abnormal muscle tone. Coordination normal. GCS eye subscore is 4. GCS verbal subscore is 5. GCS motor subscore is 6.  Left arm, left face, and left leg weakness compared to right.  Some numbness in the left face compared to the right.  Patient reports some mild decreased sensation in left arm and left leg.  Skin: Capillary refill takes less than 2 seconds. No rash noted. She is not diaphoretic. No erythema.  Psychiatric: She has a normal mood and affect.  Nursing note and vitals reviewed.    ED Treatments / Results  Labs (all labs ordered are listed, but only abnormal results are  displayed) Labs Reviewed  COMPREHENSIVE METABOLIC PANEL - Abnormal; Notable for the following components:      Result Value   CO2 19 (*)    AST 14 (*)    ALT 13 (*)    Total Bilirubin <0.1 (*)    All other components within normal limits  I-STAT CHEM 8, ED - Abnormal; Notable for the following components:   TCO2 21 (*)    All other components within normal limits  ETHANOL  PROTIME-INR  APTT  CBC  DIFFERENTIAL  RAPID URINE DRUG SCREEN, HOSP PERFORMED  URINALYSIS, ROUTINE W REFLEX MICROSCOPIC  I-STAT TROPONIN, ED  I-STAT BETA HCG BLOOD, ED ([facility], [facility], [facility] ONLY)    EKG  EKG Interpretation  Date/Time:  Wednesday January 18 2018 18:03:00 EST Ventricular Rate:  76 PR Interval:    QRS Duration: 96 QT Interval:  399 QTC Calculation: 449 R Axis:   78 Text Interpretation:  Sinus rhythm When compared to prior, no significant changes seen.  No STEMI Confirmed by Theda Belfast ([id number]) on 01/18/2018 7:08:39 PM       Radiology Ct Angio Head W Or Wo Contrast  Result Date: 01/18/2018 CLINICAL DATA:  Left facial droop and left leg weakness. Rule out stroke EXAM: CT ANGIOGRAPHY HEAD AND NECK TECHNIQUE: Multidetector CT imaging of the head and neck was performed using the standard protocol during bolus administration of intravenous contrast. Multiplanar CT image reconstructions and MIPs were obtained to evaluate the vascular anatomy. Carotid stenosis measurements (when applicable) are obtained utilizing NASCET criteria, using the distal internal carotid diameter as the denominator. CONTRAST:  50mL ISOVUE-370 IOPAMIDOL (ISOVUE-370) INJECTION 76% COMPARISON:  CT head 01/18/2018 FINDINGS: CTA NECK FINDINGS Aortic arch: Normal aortic arch. Proximal great vessels widely patent. 2 vessel arch. Right carotid system: Normal right carotid system without stenosis or dissection Left carotid system: Normal left carotid system without stenosis or dissection Vertebral arteries: Left vertebral artery dominant. Both  vertebral arteries patent without significant stenosis. Skeleton: Negative Other neck: 10 mm level 2 lymph nodes bilaterally, not pathologically enlarged. Upper chest: Negative Review of the MIP images confirms the above findings CTA HEAD FINDINGS Anterior circulation: Carotid siphon normal bilaterally. Anterior and middle cerebral arteries normal bilaterally. Posterior circulation: Left vertebral artery dominant. Right vertebral artery hypoplastic distally with minimal contribution to the basilar. Basilar widely patent. PICA, AICA, superior cerebellar, and posterior cerebral arteries patent bilaterally.  Fetal origin of the right posterior cerebral artery Venous sinuses: Patent Anatomic variants: None Delayed phase: Not performed Review of the MIP images confirms the above findings IMPRESSION: Negative CTA head and neck. Electronically Signed   By: Marlan Palau M.D.   On: 01/18/2018 18:36   Ct Angio Neck W Or Wo Contrast  Result Date: 01/18/2018 CLINICAL DATA:  Left facial droop and left leg weakness. Rule out stroke EXAM: CT ANGIOGRAPHY HEAD AND NECK TECHNIQUE: Multidetector CT imaging of the head and neck was performed using the standard protocol during bolus administration of intravenous contrast. Multiplanar CT image reconstructions and MIPs were obtained to evaluate the vascular anatomy. Carotid stenosis measurements (when applicable) are obtained utilizing NASCET criteria, using the distal internal carotid diameter as the denominator. CONTRAST:  50mL ISOVUE-370 IOPAMIDOL (ISOVUE-370) INJECTION 76% COMPARISON:  CT head 01/18/2018 FINDINGS: CTA NECK FINDINGS Aortic arch: Normal aortic arch. Proximal great vessels widely patent. 2 vessel arch. Right carotid system: Normal right carotid system without stenosis or dissection Left carotid system: Normal left carotid system without stenosis or dissection Vertebral arteries: Left vertebral artery dominant. Both vertebral arteries patent without significant  stenosis. Skeleton: Negative Other neck: 10 mm level 2 lymph nodes bilaterally, not pathologically enlarged. Upper chest: Negative Review of the MIP images confirms the above findings CTA HEAD FINDINGS Anterior circulation: Carotid siphon normal bilaterally. Anterior and middle cerebral arteries normal bilaterally. Posterior circulation: Left vertebral artery dominant. Right vertebral artery hypoplastic distally with minimal contribution to the basilar. Basilar widely patent. PICA, AICA, superior cerebellar, and posterior cerebral arteries patent bilaterally. Fetal origin of the right posterior cerebral artery Venous sinuses: Patent Anatomic variants: None Delayed phase: Not performed Review of the MIP images confirms the above findings IMPRESSION: Negative CTA head and neck. Electronically Signed   By: Marlan Palau M.D.   On: 01/18/2018 18:36   Mr Brain Wo Contrast  Result Date: 01/18/2018 CLINICAL DATA:  32 y/o  F; worsening left-sided weakness. EXAM: MRI HEAD WITHOUT CONTRAST TECHNIQUE: Multiplanar, multiecho pulse sequences of the brain and surrounding structures were obtained without intravenous contrast. COMPARISON:  01/18/2018 CTA head and neck.  10/11/2017 MRI head. FINDINGS: Brain: No acute infarction, hemorrhage, hydrocephalus, extra-axial collection or mass lesion. No significant structural or signal abnormality of the brain identified. Vascular: Normal flow voids. Skull and upper cervical spine: Normal marrow signal. Sinuses/Orbits: Mild ethmoid and frontal sinus mucosal thickening. No abnormal signal of mastoid air cells. Orbits are unremarkable. Other: Prominent left upper cervical lymph nodes as seen on CT angiogram of neck. IMPRESSION: No acute intracranial abnormality identified. Stable unremarkable MRI of the brain. Electronically Signed   By: Mitzi Hansen M.D.   On: 01/18/2018 22:37   Ct Head Code Stroke Wo Contrast  Result Date: 01/18/2018 CLINICAL DATA:  Code stroke. Focal  neuro deficit less than 6 hours. Left facial droop. Left-sided weakness EXAM: CT HEAD WITHOUT CONTRAST TECHNIQUE: Contiguous axial images were obtained from the base of the skull through the vertex without intravenous contrast. COMPARISON:  MRI 10/11/2017, CT 10/11/2017 FINDINGS: Brain: No evidence of acute infarction, hemorrhage, hydrocephalus, extra-axial collection or mass lesion/mass effect. Vascular: Negative for acute thrombosis Skull: Negative Sinuses/Orbits: Negative Other: None ASPECTS (Alberta Stroke Program Early CT Score) - Ganglionic level infarction (caudate, lentiform nuclei, internal capsule, insula, M1-M3 cortex): 7 - Supraganglionic infarction (M4-M6 cortex): 3 Total score (0-10 with 10 being normal): 10 IMPRESSION: 1. Negative CT head 2. ASPECTS is 10 3. These results were called by telephone at the time of  interpretation on 01/18/2018 at 5:56 pm to Dr. Amada JupiterKirkpatrick , who verbally acknowledged these results. Electronically Signed   By: Marlan Palauharles  Clark M.D.   On: 01/18/2018 17:56    Procedures Procedures (including critical care time)  Medications Ordered in ED Medications  iopamidol (ISOVUE-370) 76 % injection 50 mL (50 mLs Intravenous Contrast Given 01/18/18 1810)  LORazepam (ATIVAN) injection 1 mg (1 mg Intravenous Given 01/18/18 2124)     Initial Impression / Assessment and Plan / ED Course  I have reviewed the triage vital signs and the nursing notes.  Pertinent labs & imaging results that were available during my care of the patient were reviewed by me and considered in my medical decision making (see chart for details).     Salomon FickHeather L Keats is a 32 y.o. female with a past medical history significant for TTP, pseudotumor cerebri, and prior stroke with residual left arm weakness who presents with left facial droop and left leg weakness.  Patient reports that her last normal was at 4 PM when she was at her baseline.  She reports that she always has left arm weakness but has never  had left face weakness with left leg weakness.  She reports that she was singing songs and noticed her speech changed and did not sound the same.  Patient looked in the mirror and saw the facial droop and had leg weakness prompting her to call for help.  Next  Patient arrived and was assessed by the emergency team.  Code stroke was called and was discovered the patient had left leg weakness and left facial droop on exam.  Patient also reports her left hand weakness is slightly worse than baseline.  Patient reports some mild numbness in her left face but otherwise had sensation in her arm and leg.  Patient had no preceding symptoms and denied any fevers, chills, chest pain, shortness breath, nausea, vomiting, vision changes, conservation, diarrhea, dysuria.  She denies other complaints or changes in medications.  Next  Code stroke was called and neurology came to the bedside.  Patient will have CT imaging.  Next  Anticipate following up on neurology  Recommendations.  CT and CTA did not show any acute intracranial abnormalities.  Neurology recommended MRI to further evaluate.  Patient also had some headache.  Comp gated migraine considered as contributing to patient's neurologic complaints.  Anticipate following neurology recommendations after MRI.  MRI was negative.  Patient has some improvement in her facial droop on my reassessment.  Neurology felt the patient will be safe for discharge home if her MRI was negative.    Patient reassessed and was felt to be safe for discharge home.  Patient will follow up with her neurologist and her PCP.  Patient agreed with plan of care and was discharged in good condition.   Final Clinical Impressions(s) / ED Diagnoses   Final diagnoses:  Weakness  Nonintractable headache, unspecified chronicity pattern, unspecified headache type    ED Discharge Orders    None      Clinical Impression: 1. Weakness   2. Nonintractable headache, unspecified  chronicity pattern, unspecified headache type     Disposition: Discharge  Condition: Good  I have discussed the results, Dx and Tx plan with the pt(& family if present). He/she/they expressed understanding and agree(s) with the plan. Discharge instructions discussed at great length. Strict return precautions discussed and pt &/or family have verbalized understanding of the instructions. No further questions at time of discharge.    Discharge Medication  List as of 01/18/2018 11:11 PM      Follow Up: Bing Neighbors, FNP [address] [place] [address] [phone]        Naida Escalante, Canary Brim, MD 01/18/18 2329

## 2018-01-18 NOTE — ED Notes (Signed)
Stroke activated @ 1733

## 2018-01-18 NOTE — Consult Note (Signed)
Neurology Consultation Reason for Consult: Worsening left-sided weakness Referring Physician: Tegeler, C  CC: Worsening left-sided weakness  History is obtained from: Patient  HPI: Krista Maddox is a 32 y.o. female with a history of previous cryptogenic stroke who presents with mild worsening of her left-sided weakness that started acutely at 4 PM.  If she states that she was in her normal state of health with no other symptoms except for a fairly severe photophobic headache patient.  She carries a diagnosis of pseudotumor cerebri.  With her initial LP, she had a mild pleocytosis, though with her subsequent LP in February 2017 there was no pleocytosis.  She had a hypercoagulable workup with her previous hospitalization which was unremarkable.  She had cardiac monitoring ordered through Dr. Terrace Arabia, though it is unclear to me if she actually had this done or not.   LKW: 4 PM tpa given?: no, mild symptoms   ROS: A 14 point ROS was performed and is negative except as noted in the HPI.   Past Medical History:  Diagnosis Date  . DDD (degenerative disc disease), lumbar   . Headache   . Other disorders of optic nerve, not elsewhere classified, unspecified eye   . TTP (thrombotic thrombocytopenic purpura) (HCC)      Family History  Problem Relation Age of Onset  . Diabetes Mother   . Hypertension Mother   . Ovarian cancer Mother        Hysterectomy  . Healthy Father   . Ovarian cancer Maternal Grandmother        Hysterectomy     Social History:  reports that she has been smoking.  She has been smoking about 0.50 packs per day. she has never used smokeless tobacco. She reports that she does not drink alcohol or use drugs.   Exam: Current vital signs: BP 101/64   Pulse 67   Temp 98.6 F (37 C)   Resp 15   LMP 01/16/2018   SpO2 100%  Vital signs in last 24 hours: Temp:  [98.6 F (37 C)] 98.6 F (37 C) (02/06 1903) Pulse Rate:  [63-73] 67 (02/06 1845) Resp:  [15-20] 15  (02/06 1845) BP: (101-129)/(60-88) 101/64 (02/06 1845) SpO2:  [100 %] 100 % (02/06 1845)   Physical Exam  Constitutional: Appears well-developed and well-nourished.  Psych: Affect appropriate to situation Eyes: No scleral injection HENT: No OP obstrucion Head: Normocephalic.  Cardiovascular: Normal rate and regular rhythm.  Respiratory: Effort normal, non-labored breathing GI: Soft.  No distension. There is no tenderness.  Skin: WDI  Neuro: Mental Status: Patient is awake, alert, oriented to person, place, month, year, and situation. Patient is able to give a clear and coherent history. No signs of aphasia or neglect Cranial Nerves: II: Visual Fields are full. Pupils are equal, round, and reactive to light.   III,IV, VI: EOMI without ptosis or diploplia.  V: Facial sensation markedly decreased on the left VII: Facial movement with some right-sided weakness VIII: hearing is intact to voice X: Uvula elevates symmetrically XI: Shoulder shrug is symmetric. XII: tongue is midline without atrophy or fasciculations.  Motor: Tone is normal. Bulk is normal.  She has 4+/5 strength of left arm and leg Sensory: Sensation is mildly diminished in left arm and leg Cerebellar: FNF and HKS are intact on the right, consistent with weakness on the left   I have reviewed labs in epic and the results pertinent to this consultation are: CMP-unremarkable  I have reviewed the images obtained: CT  head-no acute finding CTA-no acute findings  Impression: 32 year old female with mild worsening of her chronic left deficits.  Possibilities include recrudescence in the setting of migraine versus extension of previous stroke versus new separate  Ischemic stroke.  I would favor getting MRI, and if this is negative then I think her workup could be continued as an outpatient by Dr. Terrace ArabiaYan  Recommendations: 1) MRI brain, admission for further workup if positive 2) follow-up with Dr. Terrace ArabiaYan, if  negative  Ritta SlotMcNeill Kirkpatrick, MD [facility] [phone]  If 7pm- 7am, please page neurology on call as listed in AMION.

## 2018-01-18 NOTE — Discharge Instructions (Signed)
Your workup today showed no evidence of acute stroke.  The neurology team felt you are safe for discharge home.  Please stay hydrated and follow-up with your neurologist and your primary care physician.  If any symptoms change or worsen, please return to the nearest emergency room.

## 2018-01-18 NOTE — ED Notes (Signed)
Pt. To MRI via stretcher. 

## 2018-01-18 NOTE — ED Notes (Signed)
Pt. Return from MRI via stretcher. 

## 2018-01-18 NOTE — ED Triage Notes (Signed)
Pt to ER for evaluation of new onset left sided leg weakness and left facial droop onset at 4 pm, EMS reports their stroke screen was negative. Reports +stroke in September, with left arm and face weakness. Drift noted to left arm and left leg with +face droop. Pt is a/o x4. VSS at this time.

## 2018-03-09 ENCOUNTER — Telehealth: Payer: Self-pay | Admitting: Neurology

## 2018-03-09 ENCOUNTER — Ambulatory Visit: Payer: Medicaid Other | Admitting: Neurology

## 2018-03-09 ENCOUNTER — Encounter: Payer: Self-pay | Admitting: Neurology

## 2018-03-09 VITALS — BP 131/83 | HR 90 | Ht 65.0 in | Wt 224.8 lb

## 2018-03-09 DIAGNOSIS — IMO0002 Reserved for concepts with insufficient information to code with codable children: Secondary | ICD-10-CM

## 2018-03-09 DIAGNOSIS — G43709 Chronic migraine without aura, not intractable, without status migrainosus: Secondary | ICD-10-CM | POA: Diagnosis not present

## 2018-03-09 DIAGNOSIS — I6339 Cerebral infarction due to thrombosis of other cerebral artery: Secondary | ICD-10-CM

## 2018-03-09 MED ORDER — TOPIRAMATE 100 MG PO TABS: 150.0000 mg | ORAL_TABLET | Freq: Two times a day (BID) | ORAL | 4 refills | Status: DC

## 2018-03-09 MED ORDER — VENLAFAXINE HCL ER 75 MG PO CP24: 75.0000 mg | ORAL_CAPSULE | Freq: Every day | ORAL | 11 refills | Status: DC

## 2018-03-09 MED ORDER — NORTRIPTYLINE HCL 50 MG PO CAPS: ORAL_CAPSULE | ORAL | 4 refills | Status: DC

## 2018-03-09 NOTE — Progress Notes (Signed)
[facility]  PATIENT: Krista Maddox DOB: 03-Feb-1986   HISTORY OF PRESENT ILLNESS:[name] is a 32 years old right-handed female, seen in consultation by his ophthalmologist Dr. Alois Cliche for evaluation of bilateral papillary edema. Initial evaluation was on November 03 2015   I reviewed notes from Dr. Idolina Primer in May 28 2015, patient was seen for complains of blurry vision, especially seen distance, on examination, visual acuity right eye 20/25 minus, left 20/30 plus, bilateral papillary edema  She denied previous history of headache, since May 2016, she began to notice frequent headaches, bilateral retro-orbital area constant pressure, 8 out of 10 on daily basis, sometimes it would exacerbate to a more severe 10 out of 10 pounding headaches, with associated light noise sensitivity, nauseous, bending down or getting up quickly also causing her dizzines.  She has been taking up to 12 tablets of ibuprofen each day, which only temporarily helps her headaches,  She received etonogestrel (NEXPLANON) 68 MG IMPL implant in July 14th 2016. She complains of 25 pound weight gain since last year.  MRI brain In June 2016: Enlarged partially empty sella. Enlarged optic nerve sheaths. Findings are non-specific but can be seen in association with idiopathic intracranial hypertension.No acute findings. No masses or hydrocephalus.   She had a fluoroscopy guided lumbar puncture in June 25 2015, with open pressure 33 cm water, she has developed post LP headaches, eventually had blood patch in June 30 2015, with immediate relief of her headaches  Spinal fluid testing showed total protein 43, glucose 53, WBC 20, RBC 1, 72% lymphocytes, 24% monocytes  She continue has mild blurry vision, occasionally double vision on extreme gaze to the left or right, low-grade bifrontal pressure headaches, she has tried Maxalt as needed, which has been helpful for moderate and severe  headaches. She is tolerating Topamax 100 mg twice a day well  She began to have recurrent headaches since September 2016, 40 pound weight gain over one year,  she contributed to [facility] implant that  was put in 2015, had weight gain, ws taken out in Sep, 2016   repeat LP  in Sep 29 2015, showed Open pressure is 26 cm H2O. she did not get post LP headache this time, but she had needle site low back pain, mild left hip pain since the last lumbar puncture in October 17,  Lumbar puncture January 19 2016, open pressure was 23 cm water, unfortunately she developed post LP headache, failed to improve by Fioricet, eventually had blood patch January 22 2016, she was evaluated by neurosurgeon Dr. Kathyrn Sheriff for potential shunt replacement, is referred to [facility].  MRI scan lumbar spine  10/20/16 showing prominent disc degenerative change at L5-S1 with broad-based disc osteophyte protrusion resulting in severe bilateral foraminal narrowing and likely encroachment on exiting nerve roots.   Update October 13 2017: I reviewed discharge summary on September 4 to 07-2017, she presented to the emergency room complains of acute onset left-sided numbness with associated severe migraine, MRI of the brain showed acute infarction in the right cerebellum, several small acute infarction in the right posterior frontal region, echocardiogram showed ejection fraction 55-60%, normal wall motion, CT angiogram showed patent artery, LDL 89, A1c 4.8, transcranial Doppler study which review PFO, lower extremity venous Doppler study was normal, she was started on Lipitor, and aspirin 81 mg,  TEE on August 22 2017, trivial MR, no thrombosis or mass,  CT angiogram of head and neck no significant abnormalities.  She presented  to the emergency room October 11 2017 for acute  onset right-sided headache, with residual numbness of left face and hand from her previous stroke on August 15 2017, she does not feel good, left leg  feel weak, might gave out on her.   Extensive laboratory evaluation in September 2018, mild elevated ESR 28, negative ANA, cardiolipin antibody, lupus anticoagulant, UDS, Sjogren antibody, anti-DNA antibody, ANCA, rheumatoid factor, C-reactive protein was slightly elevated 8.7  Repeat MRI of the brain October 11 2017, showed no acute abnormality, previous signal abnormality in September 2018 had resolved,  She has has daily headache all over, 9/10, now taking Fioricet twice a day 2 weeks, blurry vision  She is still taking topamax '150mg'$  bid, gabapentin 300/'600mg'$ , nortriptyline '50mg'$  qhs.   UPDATE Nov 01 2017: She has been having daily headaches since September 2018, even before that, she was having more than 15 days in a month, each headache lasts more than 4 hours, moderate to severe,  Over the years, she has tried and failed Topamax, nortriptyline, propanolol, gabapentin, Effexor.   She is now taking Cambia as needed with moderate help, She complains of mildly blurry vision.  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  Update March 09, 2018: She received one Botox injection in November 2018, reported mild improvement last for 1 month, now she is having 3-4 migraine headaches each week, cambia helps,  30-day cardiac monitoring showed no significant abnormality in December 2018 Evaluation by ophthalmologist Dr. Carolynn Sayers in February 2019 was normal.  REVIEW OF SYSTEMS: Full 14 system review of systems performed and notable only for those listed, all others are neg:     ALLERGIES: Allergies  Allergen Reactions  . Tape Rash    Cannot tolerate paper tape    HOME MEDICATIONS: Outpatient Medications Prior to Visit  Medication Sig Dispense Refill  . ALPRAZolam (XANAX) 1 MG tablet Take 1 tablet (1 mg total) at bedtime as needed by mouth for anxiety. 3 tablet 0  . aspirin 81 MG chewable tablet Chew 1 tablet (81 mg total) by mouth daily. 30 tablet 0  . atorvastatin (LIPITOR) 10 MG  tablet Take 1 tablet (10 mg total) by mouth daily at 6 PM. 90 tablet 2  . Diclofenac Potassium (CAMBIA) 50 MG PACK Take 50 mg by mouth daily as needed. 20 each 11  . gabapentin (NEURONTIN) 300 MG capsule Take 1 tablet (300 mg) two times daily. May increase dose to 600 mg twice daily as needed. (Patient taking differently: Take 300-600 mg by mouth See admin instructions. 300 mg in the morning and 600 mg at bedtime and may take an additional 300 mg in the afternoon as needed for nerve pain) 90 capsule 6  . Magnesium Oxide 400 (240 Mg) MG TABS Take 400 mg by mouth 2 (two) times daily. 60 tablet 11  . meloxicam (MOBIC) 15 MG tablet TAKE 1 TABLET BY MOUTH ONCE DAILY WITH FOOD 90 tablet 0  . norethindrone (MICRONOR,CAMILA,ERRIN) 0.35 MG tablet Take 1 tablet by mouth daily.    . nortriptyline (PAMELOR) 50 MG capsule TAKE 1 CAPSULE BY MOUTH EVERY DAY AT BEDTIME 90 capsule 0  . ondansetron (ZOFRAN ODT) 4 MG disintegrating tablet Take 1 tablet (4 mg total) by mouth every 8 (eight) hours as needed for nausea or vomiting. 30 tablet 11  . propranolol (INDERAL) 80 MG tablet Take 1 tablet (80 mg total) by mouth 2 (two) times daily. 60 tablet 11  . Riboflavin 100 MG TABS Take 1 tablet (100  mg total) by mouth 2 (two) times daily. 60 tablet 11  . tiZANidine (ZANAFLEX) 4 MG tablet Take 1 tablet (4 mg total) by mouth every 6 (six) hours as needed for muscle spasms. 30 tablet 0  . topiramate (TOPAMAX) 100 MG tablet TAKE '150MG'$  BY MOUTH TWICE DAILY (Patient taking differently: Take 150 mg by mouth 2 (two) times daily. ) 270 tablet 0  . venlafaxine XR (EFFEXOR XR) 37.5 MG 24 hr capsule Take 1 capsule (37.5 mg total) by mouth daily with breakfast. 30 capsule 11  . tiZANidine (ZANAFLEX) 4 MG tablet Take 1 tablet (4 mg total) by mouth every 6 (six) hours as needed for muscle spasms. 30 tablet 11   No facility-administered medications prior to visit.     PAST MEDICAL HISTORY: Past Medical History:  Diagnosis Date  . DDD  (degenerative disc disease), lumbar   . Headache   . Other disorders of optic nerve, not elsewhere classified, unspecified eye   . TTP (thrombotic thrombocytopenic purpura) ([place])     PAST SURGICAL HISTORY: Past Surgical History:  Procedure Laterality Date  . CESAREAN SECTION    . CHOLECYSTECTOMY    . TEE WITHOUT CARDIOVERSION N/A 08/22/2017   Procedure: TRANSESOPHAGEAL ECHOCARDIOGRAM (TEE);  Surgeon: Skeet Latch, MD;  Location: [facility] ENDOSCOPY;  Service: Cardiovascular;  Laterality: N/A;  . WISDOM TOOTH EXTRACTION      FAMILY HISTORY: Family History  Problem Relation Age of Onset  . Diabetes Mother   . Hypertension Mother   . Ovarian cancer Mother        Hysterectomy  . Healthy Father   . Ovarian cancer Maternal Grandmother        Hysterectomy    SOCIAL HISTORY: Social History   Socioeconomic History  . Marital status: Single    Spouse name: Not on file  . Number of children: 1  . Years of education: In college  . Highest education level: Not on file  Occupational History  . Occupation: Counsellor  Social Needs  . Financial resource strain: Not on file  . Food insecurity:    Worry: Not on file    Inability: Not on file  . Transportation needs:    Medical: Not on file    Non-medical: Not on file  Tobacco Use  . Smoking status: Current Every Day Smoker    Packs/day: 0.50  . Smokeless tobacco: Never Used  Substance and Sexual Activity  . Alcohol use: No    Alcohol/week: 0.0 oz  . Drug use: No  . Sexual activity: Yes    Birth control/protection: Pill  Lifestyle  . Physical activity:    Days per week: Not on file    Minutes per session: Not on file  . Stress: Not on file  Relationships  . Social connections:    Talks on phone: Not on file    Gets together: Not on file    Attends religious service: Not on file    Active member of club or organization: Not on file    Attends meetings of clubs or organizations: Not on file    Relationship status: Not on  file  . Intimate partner violence:    Fear of current or ex partner: Not on file    Emotionally abused: Not on file    Physically abused: Not on file    Forced sexual activity: Not on file  Other Topics Concern  . Not on file  Social History Narrative   Lives at home with her mother and  daughter.   3-4 cups caffeine daily.   Left-handed.     PHYSICAL EXAM  Vitals:   03/09/18 1448  BP: 131/83  Pulse: 90  Weight: 224 lb 12.8 oz (102 kg)  Height: '5\' 5"'$  (1.651 m)   Body mass index is 37.41 kg/m.    PHYSICAL EXAMNIATION:  Gen: NAD, conversant, well nourised, obese, well groomed                     Cardiovascular: Regular rate rhythm, no peripheral edema, warm, nontender. Eyes: Conjunctivae clear without exudates or hemorrhage Neck: Supple, no carotid bruits. Pulmonary: Clear to auscultation bilaterally   NEUROLOGICAL EXAM:  MENTAL STATUS: Speech:    Speech is normal; fluent and spontaneous with normal comprehension.  Cognition:     Orientation to time, place and person     Normal recent and remote memory     Normal Attention span and concentration     Normal Language, naming, repeating,spontaneous speech     Fund of knowledge   CRANIAL NERVES: CN II: Visual fields are full to confrontation. Fundoscopic exam is normal with sharp discs and no vascular changes. Pupils are round equal and briskly reactive to light. CN III, IV, VI: extraocular movement are normal. No ptosis. CN V: Facial sensation is intact to pinprick in all 3 divisions bilaterally. Corneal responses are intact.  CN VII: mild left lower face weakness. CN VIII: Hearing is normal to rubbing fingers CN IX, X: Palate elevates symmetrically. Phonation is normal. CN XI: Head turning and shoulder shrug are intact CN XII: Tongue is midline with normal movements and no atrophy.  MOTOR: She has mild left arm and leg weakness  REFLEXES: Reflexes are 2+ and symmetric at the biceps, triceps, knees, and ankles.  Plantar responses are flexor.  SENSORY: Intact to light touch, pinprick, positional and vibratory sensation are intact in fingers and toes.  COORDINATION: Rapid alternating movements and fine finger movements are intact. There is no dysmetria on finger-to-nose and heel-knee-shin.    GAIT/STANCE: Mildly unsteady, dragging her left leg Romberg is absent.    DIAGNOSTIC DATA (LABS, IMAGING, TESTING)  ASSESSMENT AND PLAN  32 y.o. 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 Carolynn Sayers in February 2019 showed no evidence of papillary edema  Chronic migraine headaches,  Now daily headaches, more than 15 days headache in 6 months, more than 4 hours, moderate to severe,  Over the years, she has tried and failed Topamax, propranolol, nortriptyline, Effexor, gabapentin,  Continue Topamax 150 mg twice a day, higher dose of Effexor 75 mg every day, nortriptyline 50 mg daily  Magnesium oxide 400 mg twice a day, riboflavin 100 mg twice a day as migraine prevention  Cambia, zofran, tizanidine as needed.  Will try Botox preauthorization again,   Marcial Pacas, M.D. Ph.D.  [facility] [place], [place] [address] Phone: [phone] Fax:      [phone]

## 2018-03-09 NOTE — Telephone Encounter (Signed)
Please see office note on March 09, 2018 patient has tried and failed multiple preventive medication as migraine prevention  Also with history of stroke,  Please try to initiate Botox as migraine prevention preauthorization again

## 2018-03-13 ENCOUNTER — Telehealth: Payer: Self-pay | Admitting: *Deleted

## 2018-03-13 ENCOUNTER — Other Ambulatory Visit: Payer: Self-pay | Admitting: *Deleted

## 2018-03-13 MED ORDER — MELOXICAM 15 MG PO TABS: ORAL_TABLET | ORAL | 0 refills | Status: DC

## 2018-03-13 NOTE — Telephone Encounter (Signed)
Received refill request from [facility] for meloxicam 15mg .  Per vo by Dr. Terrace ArabiaYan, ok to provide refills with the instruction to take one tablet daily as needed.  Rx sent to pharmacy.

## 2018-03-13 NOTE — Telephone Encounter (Signed)
Paperwork submitted to Dr. Terrace ArabiaYan for signature.

## 2018-03-14 NOTE — Telephone Encounter (Signed)
Paperwork faxed over to [facility].

## 2018-04-08 ENCOUNTER — Other Ambulatory Visit: Payer: Self-pay | Admitting: Family Medicine

## 2018-04-17 ENCOUNTER — Telehealth: Payer: Self-pay | Admitting: Neurology

## 2018-04-17 NOTE — Telephone Encounter (Signed)
Review of her most recent neurology visit with Dr. Alden Hipp April 03, 2018,  Internal ocular pressure OD 17, OS 15, fundus examination showed no edema, optic nerve looks basically normal again, no edema, but it is small, he still taking Topamax,

## 2018-05-03 ENCOUNTER — Ambulatory Visit: Payer: Medicaid Other | Admitting: Occupational Therapy

## 2018-05-11 ENCOUNTER — Other Ambulatory Visit: Payer: Self-pay

## 2018-05-11 ENCOUNTER — Ambulatory Visit: Payer: Medicaid Other | Attending: Family Medicine | Admitting: Occupational Therapy

## 2018-05-11 DIAGNOSIS — R278 Other lack of coordination: Secondary | ICD-10-CM | POA: Diagnosis not present

## 2018-05-11 DIAGNOSIS — R208 Other disturbances of skin sensation: Secondary | ICD-10-CM | POA: Insufficient documentation

## 2018-05-11 DIAGNOSIS — M6281 Muscle weakness (generalized): Secondary | ICD-10-CM | POA: Insufficient documentation

## 2018-05-11 DIAGNOSIS — I69318 Other symptoms and signs involving cognitive functions following cerebral infarction: Secondary | ICD-10-CM | POA: Diagnosis present

## 2018-05-11 NOTE — Therapy (Signed)
[facility] [address] [place], [place], [address] Phone: [phone]   Fax:  [phone]  Occupational Therapy Evaluation  Patient Details  Name: Krista Maddox MRN: [id number] Date of Birth: August 12, 1986 Referring Provider: Joaquin Courts (NP)   Encounter Date: 05/11/2018  OT End of Session - 05/11/18 0947    Visit Number  1    Number of Visits  15    Date for OT Re-Evaluation  07/27/18    Authorization Type  MCD - awaiting authorization    OT Start Time  0850    OT Stop Time  0930    OT Time Calculation (min)  40 min    Activity Tolerance  Patient tolerated treatment well    Behavior During Therapy  [facility] for tasks assessed/performed       Past Medical History:  Diagnosis Date  . DDD (degenerative disc disease), lumbar   . Headache   . Other disorders of optic nerve, not elsewhere classified, unspecified eye   . TTP (thrombotic thrombocytopenic purpura) (HCC)     Past Surgical History:  Procedure Laterality Date  . CESAREAN SECTION    . CHOLECYSTECTOMY    . TEE WITHOUT CARDIOVERSION N/A 08/22/2017   Procedure: TRANSESOPHAGEAL ECHOCARDIOGRAM (TEE);  Surgeon: Chilton Si, MD;  Location: [facility] ENDOSCOPY;  Service: Cardiovascular;  Laterality: N/A;  . WISDOM TOOTH EXTRACTION      There were no vitals filed for this visit.  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  Yes headaches are chronic and being medically managed - O.T. will not be addressing secondary to outside scope of practice    Pain Score  4     Pain Location  Head    Pain Descriptors / Indicators  Headache    Pain Type  Chronic pain    Pain Onset  More than a month ago    Pain Frequency  Constant        [facility] OT  Assessment - 05/11/18 0001      Assessment   Medical Diagnosis  Lt hand weakness, hand dysfunction (since Rt cerebellar stroke 08/15/17    Referring Provider  Joaquin Courts (NP)    Onset Date/Surgical Date  08/15/17    Hand Dominance  Left    Prior Therapy  No outpatient therapy      Precautions   Precaution Comments  none      Balance Screen   Has the patient fallen in the past 6 months  No    Has the patient had a decrease in activity level because of a fear of falling?   No    Is the patient reluctant to leave their home because of a fear of falling?   No      Home  Environment   Bathroom Shower/Tub  Tub/Shower unit;Curtain    Additional Comments  Pt lives in 1 story home with 2 steps to enter    Lives With  Family parents and 40 y.o. 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, pt has returned    Leisure  taking care of horses      ADL   Eating/Feeding  Independent  Grooming  Modified independent electric toothbrush    Upper Body Bathing  Independent    Lower Body Bathing  Independent    Upper Body Dressing  Independent    Lower Body Dressing  Modified independent wears slip on shoes    Toilet Transfer  Independent    Toileting - Recruitment consultant -  Hygiene  Independent    Tub/Shower Transfer  Modified independent      IADL   Shopping  Shops independently for small purchases    Light Housekeeping  Does personal laundry completely;Performs light daily tasks such as dishwashing, bed making    Meal Prep  Able to complete simple cold meal and snack prep;Able to complete simple warm meal prep and some baking/cooking if lighter dishes    Community Mobility  Drives own vehicle    Medication Management  Is responsible for taking medication in correct dosages at correct time    Financial Management  -- Occasional reminders needed for paying bills      Mobility    Mobility Status  Independent      Written Expression   Dominant Hand  Left    Handwriting  100% legible modifications holding pen since stroke, difficulty and extra time required       Vision - History   Baseline Vision  Wears glasses all the time      Vision Assessment   Diplopia Assessment  -- Pt reports diplopia when tired    Comment  Decreased acuity and floaters since stroke      Cognition   Memory  Impaired    Memory Impairment  Decreased short term memory    Cognition Comments  difficulty concentrating and with attention partly from migraines, but worse since stroke      Sensation   Light Touch  Impaired Detail    Light Touch Impaired Details  -- abscent Lt hand (mild detection at thumb only)    Proprioception  Impaired by gross assessment    Additional Comments  Pt reports numbness in Lt hand and face      Coordination   Gross Motor Movements are Fluid and Coordinated  No    Fine Motor Movements are Fluid and Coordinated  No    9 Hole Peg Test  Right;Left    Right 9 Hole Peg Test  28.60 sec    Left 9 Hole Peg Test  37.94 sec. (pt began taking pegs out b/t thumb and 4th finger)    Coordination  Pt able to toss/catch ball with Lt hand, but slower/difficult, ? mild apraxia      Edema   Edema  none      ROM / Strength   AROM / PROM / Strength  AROM;Strength      AROM   Overall AROM Comments  BUE AROM WNL's      Strength   Overall Strength Comments  LUE grossly 4+/5       Hand Function   Right Hand Grip (lbs)  70 lbs    Left Hand Grip (lbs)  < 10 lbs (approx 8-9 lbs) partly d/t decreased sensation                        OT Short Term Goals - 05/11/18 1610      OT SHORT TERM GOAL #1   Title  Pt to be independent with coordination and putty HEP    Baseline  Dependent, not yet  issued    Time  4    Period  Weeks    Status  New    Target Date  06/11/18      OT SHORT TERM GOAL #2   Title  Pt to verbalize understanding with safety  considerations d/t lack of sensation Lt hand     Baseline  dependent    Time  4    Period  Weeks    Status  New      OT SHORT TERM GOAL #3   Title  Pt to verbalize understanding with memory strategies for financial management and keeping track of appointments/events    Baseline  dependent     Time  4    Period  Weeks    Status  New      OT SHORT TERM GOAL #4   Title  Pt to report greater ease with writing w/ A/E prn and in reasonable amount of time    Baseline  difficulty and extra time required    Time  4    Period  Weeks    Status  New        OT Long Term Goals - 05/11/18 1159      OT LONG TERM GOAL #1   Title  Improve grip strength Lt dominant hand to 25 lbs or greater to assist with opening jars/containers    Baseline  eval: less than 10 lbs (Rt = 70 lbs)    Time  10    Period  Weeks    Status  New      OT LONG TERM GOAL #2   Title  Pt to improve coordination Lt dominant hand as evidenced by performing 9 hole peg test in 30 sec. or less    Baseline  eval: 37.94 sec    Time  10    Period  Weeks    Status  New      OT LONG TERM GOAL #3   Title  Pt to be able to style her daughter's hair sufficiently using both hands    Baseline  dependent at this time    Time  10    Period  Weeks    Status  New      OT LONG TERM GOAL #4   Title  Pt to be able to use Lt hand to assist for typing in efficient manner/reasonable amount of time for school related tasks    Baseline  Pt currently using Rt hand and only can use Lt index finger (hunt and peck method)    Time  10    Period  Weeks    Status  New            Plan - 05/11/18 0948    Clinical Impression Statement  Pt is a 32 y.o. female who presents to outpatient O.T. 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 Pt presents today with decreased coordination and strength Lt hand, abscent sensation Lt hand, and decreased memory and cognition from stroke. Pt  also suffers from chronic migraines    Occupational Profile and client history currently impacting functional performance  Rt cerebellar stroke 08/15/17, chronic migraines    Occupational performance deficits (Please refer to evaluation for details):  IADL's;Education;Work;Social Participation;Leisure;Other childcare needs    Rehab Potential  Good    OT Frequency  -- 3 visits over first month, then 2x/wk for 6 weeks    OT Treatment/Interventions  Self-care/ADL training;Moist  Heat;Fluidtherapy;DME and/or AE instruction;Therapeutic activities;Contrast Bath;Therapeutic exercise;Cognitive remediation/compensation;Coping strategies training;Neuromuscular education;Passive range of motion;Paraffin;Manual Therapy;Patient/family education    Plan  issue coordination and putty HEP, issue Lt handed typing words, discuss safety d/t lack of sensation Lt hand    Clinical Decision Making  Several treatment options, min-mod task modification necessary    Consulted and Agree with Plan of Care  Patient       Patient will benefit from skilled therapeutic intervention in order to improve the following deficits and impairments:  Decreased coordination, Decreased endurance, Decreased safety awareness, Impaired sensation, Decreased knowledge of precautions, Decreased activity tolerance, Impaired UE functional use, Pain, Decreased cognition, Decreased strength  Visit Diagnosis: Other lack of coordination - Plan: Ot plan of care cert/re-cert  Muscle weakness (generalized) - Plan: Ot plan of care cert/re-cert  Other disturbances of skin sensation - Plan: Ot plan of care cert/re-cert  Other symptoms and signs involving cognitive functions following cerebral infarction - Plan: Ot plan of care cert/re-cert    Problem List Patient Active Problem List   Diagnosis Date Noted  . Cerebrovascular accident (HCC) 11/02/2017  . 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 History of  TTP (thrombotic thrombocytopenic purpura)   . 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 Numbness on left side 08/16/2017  . Acute left lumbar radiculopathy 10/13/2016  . Chronic migraine 06/01/2016  . Pseudotumor cerebri 11/03/2015  . DENTAL PAIN 06/09/2010  . HEADACHE, TENSION 02/04/2010  . SCABIES 01/13/2010  . FOOT PAIN, LEFT 01/13/2010  . CERUMEN IMPACTION, BILATERAL 11/24/2009  . PSORIASIS 11/24/2009  . Smoker 10/15/2009  . 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 PYELONEPHRITIS 10/01/2009  . THROMBOTIC THROMBOCYTOPENIC PURPURA 12/13/2005    Kelli Churn, OTR/L 05/11/2018, 12:08 PM  [facility] [address] [place], [place], [address] Phone: [phone]   Fax:  [phone]  Name: Krista Maddox MRN: [id number] Date of Birth: January 03, 1986

## 2018-05-31 ENCOUNTER — Ambulatory Visit: Payer: Medicaid Other | Attending: Family Medicine | Admitting: Occupational Therapy

## 2018-05-31 DIAGNOSIS — I69318 Other symptoms and signs involving cognitive functions following cerebral infarction: Secondary | ICD-10-CM

## 2018-05-31 DIAGNOSIS — R208 Other disturbances of skin sensation: Secondary | ICD-10-CM | POA: Diagnosis present

## 2018-05-31 DIAGNOSIS — R278 Other lack of coordination: Secondary | ICD-10-CM

## 2018-05-31 DIAGNOSIS — M6281 Muscle weakness (generalized): Secondary | ICD-10-CM

## 2018-05-31 NOTE — Therapy (Signed)
[facility] [address] [place], [place], [address] Phone: [phone]   Fax:  [phone]  Occupational Therapy Treatment  Patient Details  Name: Krista FickHeather L Seidel MRN: [id number] Date of Birth: 06-30-1986 Referring Provider: Joaquin CourtsHarris, Kimberly (NP)   Encounter Date: 05/31/2018  OT End of Session - 05/31/18 1320    Visit Number  2    Number of Visits  15    Date for OT Re-Evaluation  07/27/18    Authorization Type  MCD     Authorization Time Period  Approved 3 visits from 05/31/18 - 06/20/18    Authorization - Visit Number  1    Authorization - Number of Visits  3    OT Start Time  1230    OT Stop Time  1315    OT Time Calculation (min)  45 min    Activity Tolerance  Patient tolerated treatment well    Behavior During Therapy  [facility] for tasks assessed/performed       Past Medical History:  Diagnosis Date  . DDD (degenerative disc disease), lumbar   . Headache   . Other disorders of optic nerve, not elsewhere classified, unspecified eye   . TTP (thrombotic thrombocytopenic purpura) (HCC)     Past Surgical History:  Procedure Laterality Date  . CESAREAN SECTION    . CHOLECYSTECTOMY    . TEE WITHOUT CARDIOVERSION N/A 08/22/2017   Procedure: TRANSESOPHAGEAL ECHOCARDIOGRAM (TEE);  Surgeon: Chilton Siandolph, Tiffany, MD;  [facility];  Service: Cardiovascular;  Laterality: N/A;  . WISDOM TOOTH EXTRACTION      There were no vitals filed for this visit.  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  Yes mild headache (chronic, being medically managed)                    OT Treatments/Exercises (OP) - 05/31/18 0001      ADLs   ADL Comments  Pt noted to have apraxia with coordination and putty ex's today, however pt inconsistent in how she presents (Pt demo great difficulty flexing fingers, 80-90% with no grip strength when asked, but no trouble picking up keys and holding keys at end of session, etc). ? behavioral component.  Discussed safety considerations d/t lack of sensation Lt hand (see pt instructions)      Exercises   Exercises  -- see pt instructions for details - therapist reviewed in depth             OT Education - 05/31/18 1309    Education Details  Putty HEP, coordination HEP including typing words for Lt hand, safety considerations d/t lack of sensation Lt hand    Person(s) Educated  Patient    Methods  Explanation;Demonstration;Handout;Verbal cues    Comprehension  Verbalized understanding;Returned demonstration;Verbal cues required       OT Short Term Goals - 05/31/18 1321      OT SHORT TERM GOAL #1   Title  Pt to be independent with coordination and putty HEP - 06/11/18    Baseline  Dependent, not yet issued    Time  4    Period  Weeks    Status  On-going  OT SHORT TERM GOAL #2   Title  Pt to verbalize understanding with safety considerations d/t lack of sensation Lt hand     Baseline  dependent    Time  4    Period  Weeks    Status  On-going      OT SHORT TERM GOAL #3   Title  Pt to verbalize understanding with memory strategies for financial management and keeping track of appointments/events    Baseline  dependent     Time  4    Period  Weeks    Status  New      OT SHORT TERM GOAL #4   Title  Pt to report greater ease with writing w/ A/E prn and in reasonable amount of time    Baseline  difficulty and extra time required    Time  4    Period  Weeks    Status  New        OT Long Term Goals - 05/11/18 1159      OT LONG TERM GOAL #1   Title  Improve grip strength Lt dominant hand to 25 lbs or greater to assist with opening jars/containers    Baseline  eval:  less than 10 lbs (Rt = 70 lbs)    Time  10    Period  Weeks    Status  New      OT LONG TERM GOAL #2   Title  Pt to improve coordination Lt dominant hand as evidenced by performing 9 hole peg test in 30 sec. or less    Baseline  eval: 37.94 sec    Time  10    Period  Weeks    Status  New      OT LONG TERM GOAL #3   Title  Pt to be able to style her daughter's hair sufficiently using both hands    Baseline  dependent at this time    Time  10    Period  Weeks    Status  New      OT LONG TERM GOAL #4   Title  Pt to be able to use Lt hand to assist for typing in efficient manner/reasonable amount of time for school related tasks    Baseline  Pt currently using Rt hand and only can use Lt index finger (hunt and peck method)    Time  10    Period  Weeks    Status  New            Plan - 05/31/18 1321    Clinical Impression Statement  Pt approximating STG'S #1-2. Pt appears to have some apraxia, but also inconsistent presentation of symptoms.     Occupational Profile and client history currently impacting functional performance  Rt cerebellar stroke 08/15/17, chronic migraines    Occupational performance deficits (Please refer to evaluation for details):  IADL's;Education;Work;Social Participation;Leisure;Other    Rehab Potential  Good    OT Frequency  -- 3 visits over first month, then 2x/wk for 6 weeks    OT Treatment/Interventions  Self-care/ADL training;Moist Heat;Fluidtherapy;DME and/or AE instruction;Therapeutic activities;Contrast Bath;Therapeutic exercise;Cognitive remediation/compensation;Coping strategies training;Neuromuscular education;Passive range of motion;Paraffin;Manual Therapy;Patient/family education    Plan  review HEP's prn, discuss and issue memory strategies, practice writing w/ A/E prn    Consulted and Agree with Plan of Care  Patient       Patient will benefit from skilled therapeutic intervention in order to improve the following deficits and impairments:  Decreased coordination, Decreased endurance, Decreased safety awareness, Impaired sensation, Decreased knowledge of precautions, Decreased activity tolerance, Impaired UE functional use, Pain, Decreased cognition, Decreased strength  Visit Diagnosis: Other lack of coordination  Muscle weakness (generalized)  Other disturbances of skin sensation  Other symptoms and signs involving cognitive functions following cerebral infarction    Problem List Patient Active Problem List   Diagnosis Date Noted  . Cerebrovascular accident (HCC) 11/02/2017  . 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 History of TTP (thrombotic thrombocytopenic purpura)   . 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 Numbness on left side 08/16/2017  . Acute left lumbar radiculopathy 10/13/2016  . Chronic migraine 06/01/2016  . Pseudotumor cerebri 11/03/2015  . DENTAL PAIN 06/09/2010  . HEADACHE, TENSION 02/04/2010  . SCABIES 01/13/2010  . FOOT PAIN, LEFT 01/13/2010  . CERUMEN IMPACTION, BILATERAL 11/24/2009  . PSORIASIS 11/24/2009  . Smoker 10/15/2009  . 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 PYELONEPHRITIS 10/01/2009  . THROMBOTIC THROMBOCYTOPENIC PURPURA 12/13/2005    Kelli Churn, OTR/L 05/31/2018, 2:17 PM  [facility] [address] [place], [place], [address] Phone: [phone]   Fax:  [phone]  Name: LONETTE STEVISON MRN: [id number] Date of Birth: 1986/05/28

## 2018-05-31 NOTE — Patient Instructions (Addendum)
    1. Grip Strengthening (Resistive Putty)   Squeeze putty using thumb and all fingers. Repeat _15-20__ times. Do __2__ sessions per day.   2. Roll putty into tube on table and pinch between first 2 fingers and thumb x 10 reps. Do 2 sessions per day.        Coordination Activities  Perform the following activities for 15 minutes 1-2 times per day with left hand(s).   Rotate ball in fingertips (clockwise and counter-clockwise x 3 full revolutions each way).  Toss and catch ball with Lt hand x 10 times  Flip cards 1 at a time as fast as you can.  Deal cards with your thumb (Hold deck in hand and push card off top with thumb).  Rotate 1 card in hand (clockwise and counter-clockwise x 3 times each way). Draw arrow on 1 card and set aside just for this exercise  Pick up 10 pennies and place in container or coin bank. Use index finger and thumb. Do NOT slide along table  Pick up coins one at a time until you get 5 in your hand, then move coins from palm to fingertips to stack one at a time (Do 3 stacks of 5).  Practice writing and typing.        SAFETY CONSIDERATIONS:   - Always test temperature of water with Rt hand (before washing hands, dishes, showering, etc)  - Do NOT use Lt hand for anything: heavy, sharp, hot, or breakable (for example - carry coffee in Rt hand in travel mug w/ lid, use chopper or buy veggies already cut, carry heavier and/or breakable objects in Rt hand)  - Use Lt hand for SAFE light weight things (Ask yourself if I drop this, will it hurt me. If no, then use Lt hand!) But use your vision to compensate for lack of feeling (I.e. LOOK AT YOUR HAND)  

## 2018-06-01 ENCOUNTER — Other Ambulatory Visit: Payer: Self-pay | Admitting: Neurology

## 2018-06-05 ENCOUNTER — Other Ambulatory Visit: Payer: Self-pay | Admitting: Neurology

## 2018-06-06 ENCOUNTER — Telehealth: Payer: Self-pay

## 2018-06-07 ENCOUNTER — Encounter: Payer: Self-pay | Admitting: Family Medicine

## 2018-06-07 ENCOUNTER — Ambulatory Visit: Payer: Medicaid Other | Admitting: Occupational Therapy

## 2018-06-07 ENCOUNTER — Ambulatory Visit (INDEPENDENT_AMBULATORY_CARE_PROVIDER_SITE_OTHER): Payer: Medicaid Other | Admitting: Family Medicine

## 2018-06-07 VITALS — BP 126/80 | HR 90 | Temp 98.1°F | Ht 65.0 in | Wt 219.0 lb

## 2018-06-07 DIAGNOSIS — R29898 Other symptoms and signs involving the musculoskeletal system: Secondary | ICD-10-CM

## 2018-06-07 DIAGNOSIS — IMO0002 Reserved for concepts with insufficient information to code with codable children: Secondary | ICD-10-CM

## 2018-06-07 DIAGNOSIS — R278 Other lack of coordination: Secondary | ICD-10-CM

## 2018-06-07 DIAGNOSIS — I69318 Other symptoms and signs involving cognitive functions following cerebral infarction: Secondary | ICD-10-CM

## 2018-06-07 DIAGNOSIS — Z3042 Encounter for surveillance of injectable contraceptive: Secondary | ICD-10-CM

## 2018-06-07 DIAGNOSIS — Z09 Encounter for follow-up examination after completed treatment for conditions other than malignant neoplasm: Secondary | ICD-10-CM | POA: Diagnosis not present

## 2018-06-07 DIAGNOSIS — M6281 Muscle weakness (generalized): Secondary | ICD-10-CM

## 2018-06-07 DIAGNOSIS — Z131 Encounter for screening for diabetes mellitus: Secondary | ICD-10-CM

## 2018-06-07 DIAGNOSIS — G43709 Chronic migraine without aura, not intractable, without status migrainosus: Secondary | ICD-10-CM

## 2018-06-07 LAB — POCT GLYCOSYLATED HEMOGLOBIN (HGB A1C): Hemoglobin A1C: 5.1 % (ref 4.0–5.6)

## 2018-06-07 LAB — POCT URINALYSIS DIP (MANUAL ENTRY)
Bilirubin, UA: NEGATIVE
Blood, UA: NEGATIVE
Glucose, UA: NEGATIVE mg/dL
Ketones, POC UA: NEGATIVE mg/dL
Leukocytes, UA: NEGATIVE
Nitrite, UA: NEGATIVE
Protein Ur, POC: NEGATIVE mg/dL
Spec Grav, UA: 1.01 (ref 1.010–1.025)
Urobilinogen, UA: 1 E.U./dL
pH, UA: 7 (ref 5.0–8.0)

## 2018-06-07 LAB — POCT URINE PREGNANCY: Preg Test, Ur: NEGATIVE

## 2018-06-07 MED ORDER — MEDROXYPROGESTERONE ACETATE 150 MG/ML IM SUSP
150.0000 mg | Freq: Once | INTRAMUSCULAR | Status: AC
  Administered 2018-06-07: 150 mg via INTRAMUSCULAR

## 2018-06-07 NOTE — Progress Notes (Signed)
Subjective:    Patient ID: Krista Maddox, female    DOB: 1986/11/25, 32 y.o.   MRN: [id number]   PCP: Raliegh Ip, NP  Chief Complaint  Patient presents with  . Back Pain  . Contraception   HPI  Krista Maddox has a past medical history of TTP, Migraine Headaches, and DDD.  She is here today for follow up and inquires about Birth Control options.   Current Status: She is inquiring today about the Birth Control Injection. She states that she is currently taking too many pills and it gets hard to keep up. Her last menstrual cycle was on 05/30/2018. Menstruals last 4-5 days. She has one child. She does not have a history of irregular periods.   She denies fevers, chills, fatigue, recent infections, weight loss, and night sweats.   She has a history of migraines and uses prescribed medications for relief. She has not had any visual changes, dizziness, and falls.   No chest pain, heart palpitations, cough and shortness of breath reported.   No reports of GI problems such as nausea, vomiting, diarrhea, and constipation. She has no reports of blood in stools, dysuria and hematuria.   No depression or anxiety.   She has lower back pain today.   Past Medical History:  Diagnosis Date  . DDD (degenerative disc disease), lumbar   . Headache   . Other disorders of optic nerve, not elsewhere classified, unspecified eye   . TTP (thrombotic thrombocytopenic purpura) (HCC)     Family History  Problem Relation Age of Onset  . Diabetes Mother   . Hypertension Mother   . Ovarian cancer Mother        Hysterectomy  . Healthy Father   . Ovarian cancer Maternal Grandmother        Hysterectomy    Social History   Socioeconomic History  . Marital status: Single    Spouse name: Not on file  . Number of children: 1  . Years of education: In college  . Highest education level: Not on file  Occupational History  . Occupation: Science writer  Social Needs  . Financial resource strain:  Not on file  . Food insecurity:    Worry: Not on file    Inability: Not on file  . Transportation needs:    Medical: Not on file    Non-medical: Not on file  Tobacco Use  . Smoking status: Current Every Day Smoker    Packs/day: 0.50  . Smokeless tobacco: Never Used  Substance and Sexual Activity  . Alcohol use: No    Alcohol/week: 0.0 oz  . Drug use: No  . Sexual activity: Yes    Birth control/protection: Pill  Lifestyle  . Physical activity:    Days per week: Not on file    Minutes per session: Not on file  . Stress: Not on file  Relationships  . Social connections:    Talks on phone: Not on file    Gets together: Not on file    Attends religious service: Not on file    Active member of club or organization: Not on file    Attends meetings of clubs or organizations: Not on file    Relationship status: Not on file  . Intimate partner violence:    Fear of current or ex partner: Not on file    Emotionally abused: Not on file    Physically abused: Not on file    Forced sexual activity: Not on file  Other Topics Concern  . Not on file  Social History Narrative   Lives at home with her mother and daughter.   3-4 cups caffeine daily.   Left-handed.    Past Surgical History:  Procedure Laterality Date  . CESAREAN SECTION    . CHOLECYSTECTOMY    . TEE WITHOUT CARDIOVERSION N/A 08/22/2017   Procedure: TRANSESOPHAGEAL ECHOCARDIOGRAM (TEE);  Surgeon: Chilton Si, MD;  Location: [facility] ENDOSCOPY;  Service: Cardiovascular;  Laterality: N/A;  . WISDOM TOOTH EXTRACTION     Immunization History  Administered Date(s) Administered  . Influenza Whole 10/15/2009  . Td 12/14/2003    Current Meds  Medication Sig  . aspirin 81 MG chewable tablet Chew 1 tablet (81 mg total) by mouth daily.  Marland Kitchen atorvastatin (LIPITOR) 10 MG tablet Take 1 tablet (10 mg total) by mouth daily at 6 PM.  . Diclofenac Potassium (CAMBIA) 50 MG PACK Take 50 mg by mouth daily as needed.  . gabapentin  (NEURONTIN) 300 MG capsule TAKE 1 CAPSULE BY MOUTH TWICE A DAY MAY INCREASE TO 2 CAPS TWICE DAILY IF NEEDED  . Magnesium Oxide 400 (240 Mg) MG TABS Take 400 mg by mouth 2 (two) times daily.  . meloxicam (MOBIC) 15 MG tablet TAKE ONE TABLET DAILY AS NEEDED WITH FOOD  . norethindrone (MICRONOR,CAMILA,ERRIN) 0.35 MG tablet Take 1 tablet by mouth daily.  . nortriptyline (PAMELOR) 50 MG capsule TAKE 1 CAPSULE BY MOUTH EVERY DAY AT BEDTIME  . ondansetron (ZOFRAN ODT) 4 MG disintegrating tablet Take 1 tablet (4 mg total) by mouth every 8 (eight) hours as needed for nausea or vomiting.  . Riboflavin 100 MG TABS Take 1 tablet (100 mg total) by mouth 2 (two) times daily.  Marland Kitchen tiZANidine (ZANAFLEX) 4 MG tablet Take 1 tablet (4 mg total) by mouth every 6 (six) hours as needed for muscle spasms.  Marland Kitchen topiramate (TOPAMAX) 100 MG tablet Take 1.5 tablets (150 mg total) by mouth 2 (two) times daily. TAKE 150MG  BY MOUTH TWICE DAILY  . venlafaxine XR (EFFEXOR-XR) 75 MG 24 hr capsule Take 1 capsule (75 mg total) by mouth daily with breakfast.   Allergies  Allergen Reactions  . Tape Rash    Cannot tolerate paper tape    BP 126/80 (BP Location: Left Arm, Patient Position: Sitting, Cuff Size: Small)   Pulse 90   Temp 98.1 F (36.7 C) (Oral)   Ht 5\' 5"  (1.651 m)   Wt 219 lb (99.3 kg)   LMP 05/30/2018   SpO2 100%   BMI 36.44 kg/m   Review of Systems  Constitutional: Negative.   HENT: Negative.   Eyes: Negative.   Respiratory: Negative.   Cardiovascular: Negative.   Gastrointestinal: Negative.   Endocrine: Negative.   Genitourinary: Negative.   Musculoskeletal: Negative.   Skin: Negative.   Allergic/Immunologic: Negative.   Neurological: Negative.   Hematological: Negative.   Psychiatric/Behavioral: Negative.     Objective:   Physical Exam  Constitutional: She is oriented to person, place, and time. She appears well-developed and well-nourished.  HENT:  Head: Normocephalic and atraumatic.  Right  Ear: External ear normal.  Left Ear: External ear normal.  Nose: Nose normal.  Mouth/Throat: Oropharynx is clear and moist.  Eyes: Pupils are equal, round, and reactive to light. EOM are normal.  Neck: Normal range of motion. Neck supple.  Cardiovascular: Normal rate, regular rhythm, normal heart sounds and intact distal pulses.  Pulmonary/Chest: Effort normal and breath sounds normal.  Abdominal: Soft. Bowel sounds are normal.  Musculoskeletal: Normal range of motion.  Lower back pain.   Neurological: She is alert and oriented to person, place, and time.  Skin: Skin is warm and dry. Capillary refill takes less than 2 seconds.  Psychiatric: She has a normal mood and affect. Her behavior is normal. Judgment and thought content normal.  Nursing note and vitals reviewed.  Assessment & Plan:   1. Encounter for Depo-Provera contraception Urine pregnancy test is negative today. She will receive Depo-Provera injection today.  - medroxyPROGESTERone (DEPO-PROVERA) injection 150 mg - POCT urine pregnancy  2. Screening for diabetes mellitus Hgb A1c is normal at 5.1 today.  - POCT glycosylated hemoglobin (Hb A1C) - POCT urinalysis dipstick  3. Hand weakness Left hand strength is getting better.   4. Hand dysfunction Much improved. She has began Occupational Therapy treatments and has completed 2 weeks. OT treatment goal is twice a week through August 2019.   5. Chronic migraine Stable today. She will continue Pamelor, Topamax, and Mobic as needed for Migraine Headaches.   6. Follow up She will follow up in 3 months for follow up and Depo-Provera injection.   Meds ordered this encounter  Medications  . medroxyPROGESTERone (DEPO-PROVERA) injection 150 mg    Raliegh IpNatalie Cammi Consalvo,  MSN, FNP-BC Patient [facility] [address]  [place], [place] [address] [phone]

## 2018-06-07 NOTE — Therapy (Signed)
[facility] [address] [place], [place], [address] Phone: [phone]   Fax:  [phone]  Occupational Therapy Treatment  Patient Details  Name: Krista Maddox MRN: [id number] Date of Birth: March 04, 1986 Referring Provider: Molli Barrows (NP)   Encounter Date: 06/07/2018  OT End of Session - 06/07/18 1214    Visit Number  3    Number of Visits  15    Date for OT Re-Evaluation  07/27/18    Authorization Type  MCD     Authorization Time Period  Approved 3 visits from 05/31/18 - 06/20/18    Authorization - Visit Number  2    Authorization - Number of Visits  3    OT Start Time  0936    OT Stop Time  1017    OT Time Calculation (min)  41 min    Activity Tolerance  Patient tolerated treatment well    Behavior During Therapy  [facility] for tasks assessed/performed       Past Medical History:  Diagnosis Date  . DDD (degenerative disc disease), lumbar   . Headache   . Other disorders of optic nerve, not elsewhere classified, unspecified eye   . TTP (thrombotic thrombocytopenic purpura) (HCC)     Past Surgical History:  Procedure Laterality Date  . CESAREAN SECTION    . CHOLECYSTECTOMY    . TEE WITHOUT CARDIOVERSION N/A 08/22/2017   Procedure: TRANSESOPHAGEAL ECHOCARDIOGRAM (TEE);  Surgeon: Skeet Latch, MD;  Location: [place];  Service: Cardiovascular;  Laterality: N/A;  . WISDOM TOOTH EXTRACTION      There were no vitals filed for this visit.  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  Yes    Pain Score  8     Pain Location  Back    Pain Orientation  Lower;Left    Pain Descriptors / Indicators  Sharp    Pain Type   Acute pain    Pain Onset  In the past 7 days Pt has DDD, but reports increased back pain the last week    Pain Frequency  Constant    Aggravating Factors   twisting    Pain Relieving Factors  ?                    OT Treatments/Exercises (OP) - 06/07/18 0001      ADLs   Writing  Pt practiced writing with various types of pens and A/E - pt preferred medium sized pen over all others    ADL Comments  Pt issued memory compensatory strategies and reviewed how to incorporate for keeping track of appts, paying bills, medicine, etc      Exercises   Exercises  -- Reviewed coordination and putty HEPs - pt demo each              OT Education - 06/07/18 1000    Education Details  Memory compensation strategies    Person(s) Educated  Patient    Methods  Explanation;Handout    Comprehension  Verbalized understanding       OT Short Term Goals - 06/07/18 1215      OT SHORT TERM GOAL #  1   Title  Pt to be independent with coordination and putty HEP - 06/11/18    Baseline  Dependent, not yet issued    Time  4    Period  Weeks    Status  Achieved      OT SHORT TERM GOAL #2   Title  Pt to verbalize understanding with safety considerations d/t lack of sensation Lt hand     Baseline  dependent    Time  4    Period  Weeks    Status  Achieved      OT SHORT TERM GOAL #3   Title  Pt to verbalize understanding with memory strategies for financial management and keeping track of appointments/events    Baseline  dependent     Time  4    Period  Weeks    Status  Achieved      OT SHORT TERM GOAL #4   Title  Pt to report greater ease with writing w/ A/E prn and in reasonable amount of time    Baseline  difficulty and extra time required    Time  4    Period  Weeks    Status  On-going        OT Long Term Goals - 05/11/18 1159      OT LONG TERM GOAL #1   Title  Improve grip strength Lt dominant hand to 25 lbs or greater to assist with opening jars/containers    Baseline   eval: less than 10 lbs (Rt = 70 lbs)    Time  10    Period  Weeks    Status  New      OT LONG TERM GOAL #2   Title  Pt to improve coordination Lt dominant hand as evidenced by performing 9 hole peg test in 30 sec. or less    Baseline  eval: 37.94 sec    Time  10    Period  Weeks    Status  New      OT LONG TERM GOAL #3   Title  Pt to be able to style her daughter's hair sufficiently using both hands    Baseline  dependent at this time    Time  10    Period  Weeks    Status  New      OT LONG TERM GOAL #4   Title  Pt to be able to use Lt hand to assist for typing in efficient manner/reasonable amount of time for school related tasks    Baseline  Pt currently using Rt hand and only can use Lt index finger (hunt and peck method)    Time  10    Period  Weeks    Status  New            Plan - 06/07/18 1216    Clinical Impression Statement  Pt met STG's #1 -3. Approximating STG #4.     Occupational Profile and client history currently impacting functional performance  Rt cerebellar stroke 08/15/17, chronic migraines    Occupational performance deficits (Please refer to evaluation for details):  IADL's;Education;Work;Social Participation;Leisure;Other    Rehab Potential  Good    OT Frequency  -- 3 visits over 1 month, then 2x/wk for 6 weeks    OT Treatment/Interventions  Self-care/ADL training;Moist Heat;Fluidtherapy;DME and/or AE instruction;Therapeutic activities;Contrast Bath;Therapeutic exercise;Cognitive remediation/compensation;Coping strategies training;Neuromuscular education;Passive range of motion;Paraffin;Manual Therapy;Patient/family education    Plan  submit for more MCD visits, assess remaining STG  Consulted and Agree with Plan of Care  Patient       Patient will benefit from skilled therapeutic intervention in order to improve the following deficits and impairments:  Decreased coordination, Decreased endurance, Decreased safety awareness, Impaired sensation,  Decreased knowledge of precautions, Decreased activity tolerance, Impaired UE functional use, Pain, Decreased cognition, Decreased strength  Visit Diagnosis: Muscle weakness (generalized)  Other lack of coordination  Other symptoms and signs involving cognitive functions following cerebral infarction    Problem List Patient Active Problem List   Diagnosis Date Noted  . Cerebrovascular accident ([place]) 11/02/2017  . 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 History of TTP (thrombotic thrombocytopenic purpura)   . 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 Numbness on left side 08/16/2017  . Acute left lumbar radiculopathy 10/13/2016  . Chronic migraine 06/01/2016  . Pseudotumor cerebri 11/03/2015  . DENTAL PAIN 06/09/2010  . HEADACHE, TENSION 02/04/2010  . SCABIES 01/13/2010  . FOOT PAIN, LEFT 01/13/2010  . CERUMEN IMPACTION, BILATERAL 11/24/2009  . PSORIASIS 11/24/2009  . Smoker 10/15/2009  . 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 PYELONEPHRITIS 10/01/2009  . THROMBOTIC THROMBOCYTOPENIC PURPURA 12/13/2005    Carey Bullocks, OTR/L 06/07/2018, 12:17 PM  [facility] [address] [place], [place], [address] Phone: [phone]   Fax:  [phone]  Name: Krista Maddox MRN: [id number] Date of Birth: 03/26/1986

## 2018-06-07 NOTE — Patient Instructions (Signed)

## 2018-06-14 ENCOUNTER — Ambulatory Visit: Payer: Medicaid Other | Attending: Family Medicine | Admitting: Occupational Therapy

## 2018-06-14 DIAGNOSIS — M6281 Muscle weakness (generalized): Secondary | ICD-10-CM | POA: Diagnosis present

## 2018-06-14 DIAGNOSIS — I69318 Other symptoms and signs involving cognitive functions following cerebral infarction: Secondary | ICD-10-CM | POA: Insufficient documentation

## 2018-06-14 DIAGNOSIS — R278 Other lack of coordination: Secondary | ICD-10-CM | POA: Insufficient documentation

## 2018-06-14 DIAGNOSIS — R208 Other disturbances of skin sensation: Secondary | ICD-10-CM | POA: Diagnosis present

## 2018-06-14 NOTE — Therapy (Signed)
[facility] [address] [place], [place], [address] Phone: [phone]   Fax:  [phone]  Occupational Therapy Treatment  Patient Details  Name: Krista Maddox MRN: [id number] Date of Birth: 01-Oct-1986 Referring Provider: Molli Barrows (NP)   Encounter Date: 06/14/2018  OT End of Session - 06/14/18 0930    Visit Number  4    Number of Visits  15    Date for OT Re-Evaluation  07/27/18    Authorization Type  MCD     Authorization Time Period  Approved 3 visits from 05/31/18 - 06/20/18    Authorization - Visit Number  3    Authorization - Number of Visits  3    OT Start Time  3785    OT Stop Time  0931    OT Time Calculation (min)  38 min    Activity Tolerance  Patient tolerated treatment well    Behavior During Therapy  [facility] for tasks assessed/performed       Past Medical History:  Diagnosis Date  . DDD (degenerative disc disease), lumbar   . Headache   . Other disorders of optic nerve, not elsewhere classified, unspecified eye   . TTP (thrombotic thrombocytopenic purpura) (HCC)     Past Surgical History:  Procedure Laterality Date  . CESAREAN SECTION    . CHOLECYSTECTOMY    . TEE WITHOUT CARDIOVERSION N/A 08/22/2017   Procedure: TRANSESOPHAGEAL ECHOCARDIOGRAM (TEE);  Surgeon: Skeet Latch, MD;  Location: [facility];  Service: Cardiovascular;  Laterality: N/A;  . WISDOM TOOTH EXTRACTION      There were no vitals filed for this visit.  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  Yes    Pain Score  3     Pain Location  Back    Pain Orientation  Left;Lower    Pain Descriptors / Indicators  Aching    Pain Type  Acute pain    Pain Onset   1 to 4 weeks ago    Pain Frequency  Intermittent    Aggravating Factors   twisting    Pain Relieving Factors  ?         [facility] OT Assessment - 06/14/18 0001      Hand Function   Left Hand Grip (lbs)  33 lbs               OT Treatments/Exercises (OP) - 06/14/18 0001      ADLs   ADL Comments  Assessed remaining STG's and grip strength Lt hand. See assessment and goal section. Upgraded pt to red resistance putty and issued for home use      Exercises   Exercises  Hand      Hand Exercises   Other Hand Exercises  Grip strengthening with red putty x 15 reps      Fine Motor Coordination (Hand/Wrist)   Fine Motor Coordination  Small Pegboard    Small Pegboard  Pt placing small pegs in pegboard Lt hand while copying peg design at 100% accuracy, only min difficulty using Lt hand. Pt then removing pegs manipulating up to 5 pegs at a time for fingertip to/from palm translation with extra time and mod difficulty. Pt unable  to finish removing all pegs d/t time constraints               OT Short Term Goals - 06/14/18 5056      OT SHORT TERM GOAL #1   Title  Pt to be independent with coordination and putty HEP - 06/11/18    Baseline  Dependent, not yet issued    Time  4    Period  Weeks    Status  Achieved      OT SHORT TERM GOAL #2   Title  Pt to verbalize understanding with safety considerations d/t lack of sensation Lt hand     Baseline  dependent    Time  4    Period  Weeks    Status  Achieved      OT SHORT TERM GOAL #3   Title  Pt to verbalize understanding with memory strategies for financial management and keeping track of appointments/events    Baseline  dependent     Time  4    Period  Weeks    Status  Achieved      OT SHORT TERM GOAL #4   Title  Pt to report greater ease with writing w/ A/E prn and in reasonable amount of time    Baseline  difficulty and extra time required    Time  4    Period  Weeks    Status  Partially Met Pt reports writing is  getting easier, but still requires extra time        OT Long Term Goals - 06/14/18 9794      OT LONG TERM GOAL #1   Title  Improve grip strength Lt dominant hand to 25 lbs or greater to assist with opening jars/containers    Baseline  eval: less than 10 lbs (Rt = 70 lbs)    Time  6    Period  Weeks    Status  Achieved 06/14/18 = 33 lbs      OT LONG TERM GOAL #2   Title  Pt to improve coordination Lt dominant hand as evidenced by performing 9 hole peg test in 30 sec. or less    Baseline  eval: 37.94 sec    Time  6    Period  Weeks    Status  New      OT LONG TERM GOAL #3   Title  Pt to be able to style her daughter's hair sufficiently using both hands    Baseline  dependent at this time    Time  6    Period  Weeks    Status  New      OT LONG TERM GOAL #4   Title  Pt to be able to use Lt hand to assist for typing in efficient manner/reasonable amount of time for school related tasks    Baseline  Pt currently using Rt hand and only can use Lt index finger (hunt and peck method)    Time  6    Period  Weeks    Status  New      OT LONG TERM GOAL #5   Title  Pt to improve grip strength Lt dominant hand to 45 lbs or greater to open jars/containers    Baseline  eval = less than 10 lbs, 06/14/18 = 33 lbs    Time  6    Period  Weeks    Status  New  Plan - 06/14/18 0925    Clinical Impression Statement  Pt met all STG's and LTG #1. Pt with improved grip strength Lt hand.     Occupational Profile and client history currently impacting functional performance  Rt cerebellar stroke 08/15/17, chronic migraines    Occupational performance deficits (Please refer to evaluation for details):  IADL's;Education;Work;Social Participation;Leisure;Other    Rehab Potential  Good    OT Frequency  2x / week    OT Duration  6 weeks    OT Treatment/Interventions  Self-care/ADL training;Moist Heat;Fluidtherapy;DME and/or AE instruction;Therapeutic activities;Contrast Bath;Therapeutic  exercise;Cognitive remediation/compensation;Coping strategies training;Neuromuscular education;Passive range of motion;Paraffin;Manual Therapy;Patient/family education    Plan  Asking for 2x/wk for 6 more weeks to address remaining LTG's - next visit scheduled for 06/21/18    Consulted and Agree with Plan of Care  Patient       Patient will benefit from skilled therapeutic intervention in order to improve the following deficits and impairments:  Decreased coordination, Decreased endurance, Decreased safety awareness, Impaired sensation, Decreased knowledge of precautions, Decreased activity tolerance, Impaired UE functional use, Pain, Decreased cognition, Decreased strength  Visit Diagnosis: Muscle weakness (generalized)  Other lack of coordination    Problem List Patient Active Problem List   Diagnosis Date Noted  . Cerebrovascular accident ([place]) 11/02/2017  . 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 History of TTP (thrombotic thrombocytopenic purpura)   . 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 Numbness on left side 08/16/2017  . Acute left lumbar radiculopathy 10/13/2016  . Chronic migraine 06/01/2016  . Pseudotumor cerebri 11/03/2015  . DENTAL PAIN 06/09/2010  . HEADACHE, TENSION 02/04/2010  . SCABIES 01/13/2010  . FOOT PAIN, LEFT 01/13/2010  . CERUMEN IMPACTION, BILATERAL 11/24/2009  . PSORIASIS 11/24/2009  . Smoker 10/15/2009  . 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 PYELONEPHRITIS 10/01/2009  . THROMBOTIC THROMBOCYTOPENIC PURPURA 12/13/2005    Carey Bullocks, OTR/L 06/14/2018, 9:32 AM  [facility] [address] [place], [place], [address] Phone: [phone]   Fax:  [phone]  Name: Krista Maddox MRN: [id number] Date of Birth: 1986/09/21

## 2018-06-19 ENCOUNTER — Encounter: Payer: Medicaid Other | Admitting: Occupational Therapy

## 2018-06-21 ENCOUNTER — Encounter: Payer: Self-pay | Admitting: Occupational Therapy

## 2018-06-21 ENCOUNTER — Ambulatory Visit: Payer: Medicaid Other | Admitting: Occupational Therapy

## 2018-06-21 DIAGNOSIS — M6281 Muscle weakness (generalized): Secondary | ICD-10-CM

## 2018-06-21 DIAGNOSIS — I69318 Other symptoms and signs involving cognitive functions following cerebral infarction: Secondary | ICD-10-CM

## 2018-06-21 DIAGNOSIS — R208 Other disturbances of skin sensation: Secondary | ICD-10-CM

## 2018-06-21 DIAGNOSIS — R278 Other lack of coordination: Secondary | ICD-10-CM

## 2018-06-21 NOTE — Therapy (Signed)
[facility] [address] [place], [place], [address] Phone: [phone]   Fax:  [phone]  Occupational Therapy Treatment  Patient Details  Name: Krista Maddox MRN: [id number] Date of Birth: 08-19-1986 Referring Provider: Molli Barrows (NP)   Encounter Date: 06/21/2018  OT End of Session - 06/21/18 0914    Visit Number  5    Number of Visits  15    Date for OT Re-Evaluation  07/27/18    Authorization Time Period  Approved 3 visits from 05/31/18 - 06/20/18, additional visits approved 06/21/18-08/01/18 12 visits    Authorization - Visit Number  4    Authorization - Number of Visits  15    OT Start Time  0851    OT Stop Time  0930    OT Time Calculation (min)  39 min    Activity Tolerance  Patient tolerated treatment well    Behavior During Therapy  [facility] for tasks assessed/performed       Past Medical History:  Diagnosis Date  . DDD (degenerative disc disease), lumbar   . Headache   . Other disorders of optic nerve, not elsewhere classified, unspecified eye   . TTP (thrombotic thrombocytopenic purpura) (HCC)     Past Surgical History:  Procedure Laterality Date  . CESAREAN SECTION    . CHOLECYSTECTOMY    . TEE WITHOUT CARDIOVERSION N/A 08/22/2017   Procedure: TRANSESOPHAGEAL ECHOCARDIOGRAM (TEE);  Surgeon: Skeet Latch, MD;  Location: [facility];  Service: Cardiovascular;  Laterality: N/A;  . WISDOM TOOTH EXTRACTION      There were no vitals filed for this visit.  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  No/denies            Treatment: Grooved pegboard with LUE for increased fine motor coordination, increased time and  min-mod difficulty, removing pegs with in hand manipulation min v.c,. Gripper set at level 1 to pick up 1 inch blocks, mod difficulty, min v.c(only 1/2 blocks completed due to fatigue) Typing test 4 wpm, 100% accuracy. Typing activities for increased speed and accuracy, min-mod difficulty, min v.c. Pt was provided with left handed typing words for use at home. Graded clothespins with LUE for sustained pinch, min difficulty  min v.c                OT Short Term Goals - 06/14/18 2841      OT SHORT TERM GOAL #1   Title  Pt to be independent with coordination and putty HEP - 06/11/18    Baseline  Dependent, not yet issued    Time  4    Period  Weeks    Status  Achieved      OT SHORT TERM GOAL #2   Title  Pt to verbalize understanding with safety considerations d/t lack of sensation Lt hand     Baseline  dependent    Time  4    Period  Weeks    Status  Achieved      OT SHORT TERM GOAL #3   Title  Pt to verbalize understanding with memory strategies for financial management and keeping track of appointments/events    Baseline  dependent  Time  4    Period  Weeks    Status  Achieved      OT SHORT TERM GOAL #4   Title  Pt to report greater ease with writing w/ A/E prn and in reasonable amount of time    Baseline  difficulty and extra time required    Time  4    Period  Weeks    Status  Partially Met Pt reports writing is getting easier, but still requires extra time        OT Long Term Goals - 06/14/18 4332      OT LONG TERM GOAL #1   Title  Improve grip strength Lt dominant hand to 25 lbs or greater to assist with opening jars/containers    Baseline  eval: less than 10 lbs (Rt = 70 lbs)    Time  6    Period  Weeks    Status  Achieved 06/14/18 = 33 lbs      OT LONG TERM GOAL #2   Title  Pt to improve coordination Lt dominant hand as evidenced by performing 9 hole peg test in 30 sec. or less    Baseline  eval: 37.94 sec    Time  6    Period  Weeks    Status   New      OT LONG TERM GOAL #3   Title  Pt to be able to style her daughter's hair sufficiently using both hands    Baseline  dependent at this time    Time  6    Period  Weeks    Status  New      OT LONG TERM GOAL #4   Title  Pt to be able to use Lt hand to assist for typing in efficient manner/reasonable amount of time for school related tasks    Baseline  Pt currently using Rt hand and only can use Lt index finger (hunt and peck method)    Time  6    Period  Weeks    Status  New      OT LONG TERM GOAL #5   Title  Pt to improve grip strength Lt dominant hand to 45 lbs or greater to open jars/containers    Baseline  eval = less than 10 lbs, 06/14/18 = 33 lbs    Time  6    Period  Weeks    Status  New            Plan - 06/21/18 0915    Clinical Impression Statement  Pt is progresssing towards goals for LUE fine motor coordination.     Occupational performance deficits (Please refer to evaluation for details):  IADL's;Education;Work;Social Participation;Leisure;Other;ADL's    Rehab Potential  Good    OT Frequency  2x / week    OT Duration  6 weeks    OT Treatment/Interventions  Self-care/ADL training;Moist Heat;Fluidtherapy;DME and/or AE instruction;Therapeutic activities;Contrast Bath;Therapeutic exercise;Cognitive remediation/compensation;Coping strategies training;Neuromuscular education;Passive range of motion;Paraffin;Manual Therapy;Patient/family education    Plan  continue to work on LUE strength and coordination    Consulted and Agree with Plan of Care  Patient       Patient will benefit from skilled therapeutic intervention in order to improve the following deficits and impairments:  Decreased coordination, Decreased endurance, Decreased safety awareness, Impaired sensation, Decreased knowledge of precautions, Decreased activity tolerance, Impaired UE functional use, Pain, Decreased cognition, Decreased strength  Visit Diagnosis: Muscle weakness  (generalized)  Other lack of coordination  Other symptoms and signs involving cognitive functions following cerebral infarction  Other disturbances of skin sensation    Problem List Patient Active Problem List   Diagnosis Date Noted  . Cerebrovascular accident ([place]) 11/02/2017  . Cerebrovascular accident (CVA) due to thrombosis of anterior cerebral artery ([place])   . History of TTP (thrombotic thrombocytopenic purpura)   . 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 Numbness on left side 08/16/2017  . Acute left lumbar radiculopathy 10/13/2016  . Chronic migraine 06/01/2016  . Pseudotumor cerebri 11/03/2015  . DENTAL PAIN 06/09/2010  . HEADACHE, TENSION 02/04/2010  . SCABIES 01/13/2010  . FOOT PAIN, LEFT 01/13/2010  . CERUMEN IMPACTION, BILATERAL 11/24/2009  . PSORIASIS 11/24/2009  . Smoker 10/15/2009  . 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 PYELONEPHRITIS 10/01/2009  . THROMBOTIC THROMBOCYTOPENIC PURPURA 12/13/2005    RINE,KATHRYN 06/21/2018, 10:31 AM Theone Murdoch, OTR/L [phone] Phone: [phone] 10:37 AM 06/21/18 [facility] [address] [place], [place], [address] Phone: [phone]   Fax:  [phone]  Name: ARPITA FENTRESS MRN: [id number] Date of Birth: [birth year]

## 2018-06-26 ENCOUNTER — Ambulatory Visit: Payer: Medicaid Other | Admitting: Occupational Therapy

## 2018-06-26 DIAGNOSIS — R278 Other lack of coordination: Secondary | ICD-10-CM

## 2018-06-26 DIAGNOSIS — I69318 Other symptoms and signs involving cognitive functions following cerebral infarction: Secondary | ICD-10-CM

## 2018-06-26 DIAGNOSIS — M6281 Muscle weakness (generalized): Secondary | ICD-10-CM | POA: Diagnosis not present

## 2018-06-26 NOTE — Therapy (Signed)
[facility] [address] [place], [place], [address] Phone: [phone]   Fax:  [phone]  Occupational Therapy Treatment  Patient Details  Name: Krista Maddox MRN: [id number] Date of Birth: 1986-09-13 Referring Provider: Molli Barrows (NP)   Encounter Date: 06/26/2018  OT End of Session - 06/26/18 1058    Visit Number  6    Number of Visits  15    Date for OT Re-Evaluation  07/27/18    Authorization Type  MCD     Authorization Time Period  Approved 3 visits from 05/31/18 - 06/20/18, additional 12 visits approved 06/21/18-08/01/18     Authorization - Visit Number  5    Authorization - Number of Visits  15    OT Start Time  1017    OT Stop Time  1100    OT Time Calculation (min)  43 min    Activity Tolerance  Patient tolerated treatment well    Behavior During Therapy  [facility] for tasks assessed/performed       Past Medical History:  Diagnosis Date  . DDD (degenerative disc disease), lumbar   . Headache   . Other disorders of optic nerve, not elsewhere classified, unspecified eye   . TTP (thrombotic thrombocytopenic purpura) (HCC)     Past Surgical History:  Procedure Laterality Date  . CESAREAN SECTION    . CHOLECYSTECTOMY    . TEE WITHOUT CARDIOVERSION N/A 08/22/2017   Procedure: TRANSESOPHAGEAL ECHOCARDIOGRAM (TEE);  Surgeon: Skeet Latch, MD;  Location: [facility];  Service: Cardiovascular;  Laterality: N/A;  . WISDOM TOOTH EXTRACTION      There were no vitals filed for this visit.  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  Yes migraine - O.T. not addressing    Pain Score  7     Pain Location  Head    Pain Descriptors / Indicators  Headache    Pain Type  Acute pain    Pain Onset   In the past 7 days day 3    Pain Frequency  Constant                   OT Treatments/Exercises (OP) - 06/26/18 0001      ADLs   Grooming  Pt practiced braiding string in prep for braiding daughter's hair. Pt with mod difficulty and extra time - ? visual perceptual deficits vs. motor apraxia. Pt then braided therapist's hair with decr. control in keeping braid tight, but able to do      Hand Exercises   Other Hand Exercises  Gripper set at level 2 resistance to pick up blocks Lt hand - pt with mod difficulty and extra time.       Fine Motor Coordination (Hand/Wrist)   Fine Motor Coordination  Small Pegboard    Small Pegboard  Pt placing small pegs in pegboard Lt hand while copying peg design at 100% accuracy, only min difficulty using Lt hand. Pt however had mod difficulty following design and recommended different scanning strategy               OT Short Term Goals - 06/14/18 5277      OT SHORT TERM GOAL #1   Title  Pt to be independent with coordination and putty  HEP - 06/11/18    Baseline  Dependent, not yet issued    Time  4    Period  Weeks    Status  Achieved      OT SHORT TERM GOAL #2   Title  Pt to verbalize understanding with safety considerations d/t lack of sensation Lt hand     Baseline  dependent    Time  4    Period  Weeks    Status  Achieved      OT SHORT TERM GOAL #3   Title  Pt to verbalize understanding with memory strategies for financial management and keeping track of appointments/events    Baseline  dependent     Time  4    Period  Weeks    Status  Achieved      OT SHORT TERM GOAL #4   Title  Pt to report greater ease with writing w/ A/E prn and in reasonable amount of time    Baseline  difficulty and extra time required    Time  4    Period  Weeks    Status  Partially Met Pt reports writing is getting easier, but still requires extra time        OT Long Term Goals - 06/14/18 8850      OT LONG TERM GOAL #1   Title   Improve grip strength Lt dominant hand to 25 lbs or greater to assist with opening jars/containers    Baseline  eval: less than 10 lbs (Rt = 70 lbs)    Time  6    Period  Weeks    Status  Achieved 06/14/18 = 33 lbs      OT LONG TERM GOAL #2   Title  Pt to improve coordination Lt dominant hand as evidenced by performing 9 hole peg test in 30 sec. or less    Baseline  eval: 37.94 sec    Time  6    Period  Weeks    Status  New      OT LONG TERM GOAL #3   Title  Pt to be able to style her daughter's hair sufficiently using both hands    Baseline  dependent at this time    Time  6    Period  Weeks    Status  New      OT LONG TERM GOAL #4   Title  Pt to be able to use Lt hand to assist for typing in efficient manner/reasonable amount of time for school related tasks    Baseline  Pt currently using Rt hand and only can use Lt index finger (hunt and peck method)    Time  6    Period  Weeks    Status  New      OT LONG TERM GOAL #5   Title  Pt to improve grip strength Lt dominant hand to 45 lbs or greater to open jars/containers    Baseline  eval = less than 10 lbs, 06/14/18 = 33 lbs    Time  6    Period  Weeks    Status  New            Plan - 06/26/18 1100    Clinical Impression Statement  Pt is progresssing towards goals for LUE fine motor coordination.     Occupational Profile and client history currently impacting functional performance  Rt cerebellar stroke 08/15/17, chronic migraines    Occupational performance deficits (Please refer to  evaluation for details):  IADL's;Education;Work;Social Participation;Leisure;Other;ADL's    Rehab Potential  Good    OT Frequency  2x / week    OT Duration  6 weeks    OT Treatment/Interventions  Self-care/ADL training;Moist Heat;Fluidtherapy;DME and/or AE instruction;Therapeutic activities;Contrast Bath;Therapeutic exercise;Cognitive remediation/compensation;Coping strategies training;Neuromuscular education;Passive range of  motion;Paraffin;Manual Therapy;Patient/family education    Plan  continue to work on LUE strength and coordination    Consulted and Agree with Plan of Care  Patient       Patient will benefit from skilled therapeutic intervention in order to improve the following deficits and impairments:  Decreased coordination, Decreased endurance, Decreased safety awareness, Impaired sensation, Decreased knowledge of precautions, Decreased activity tolerance, Impaired UE functional use, Pain, Decreased cognition, Decreased strength  Visit Diagnosis: Other lack of coordination  Other symptoms and signs involving cognitive functions following cerebral infarction  Muscle weakness (generalized)    Problem List Patient Active Problem List   Diagnosis Date Noted  . Cerebrovascular accident ([place]) 11/02/2017  . 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 History of TTP (thrombotic thrombocytopenic purpura)   . 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 Numbness on left side 08/16/2017  . Acute left lumbar radiculopathy 10/13/2016  . Chronic migraine 06/01/2016  . Pseudotumor cerebri 11/03/2015  . DENTAL PAIN 06/09/2010  . HEADACHE, TENSION 02/04/2010  . SCABIES 01/13/2010  . FOOT PAIN, LEFT 01/13/2010  . CERUMEN IMPACTION, BILATERAL 11/24/2009  . PSORIASIS 11/24/2009  . Smoker 10/15/2009  . 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 PYELONEPHRITIS 10/01/2009  . THROMBOTIC THROMBOCYTOPENIC PURPURA 12/13/2005    Carey Bullocks, OTR/L 06/26/2018, 11:01 AM  [facility] [address] [place], [place], [address] Phone: [phone]   Fax:  [phone]  Name: Krista Maddox MRN: [id number] Date of Birth: 09/30/1986

## 2018-06-28 ENCOUNTER — Ambulatory Visit: Payer: Medicaid Other | Admitting: Occupational Therapy

## 2018-07-05 ENCOUNTER — Ambulatory Visit: Payer: Medicaid Other | Admitting: Occupational Therapy

## 2018-07-06 DIAGNOSIS — Z0271 Encounter for disability determination: Secondary | ICD-10-CM

## 2018-07-10 ENCOUNTER — Ambulatory Visit: Payer: Medicaid Other | Admitting: Occupational Therapy

## 2018-07-10 DIAGNOSIS — M6281 Muscle weakness (generalized): Secondary | ICD-10-CM | POA: Diagnosis not present

## 2018-07-10 DIAGNOSIS — R278 Other lack of coordination: Secondary | ICD-10-CM

## 2018-07-10 DIAGNOSIS — I69318 Other symptoms and signs involving cognitive functions following cerebral infarction: Secondary | ICD-10-CM

## 2018-07-10 NOTE — Therapy (Signed)
[facility] [address] [place], [place], [address] Phone: [phone]   Fax:  [phone]  Occupational Therapy Treatment  Patient Details  Name: Krista Maddox MRN: [id number] Date of Birth: 04/17/1986 Referring Provider: Molli Barrows (NP)   Encounter Date: 07/10/2018  OT End of Session - 07/10/18 1104    Visit Number  7    Number of Visits  15    Date for OT Re-Evaluation  07/27/18    Authorization Type  MCD     Authorization Time Period  Approved 3 visits from 05/31/18 - 06/20/18, additional 12 visits approved 06/21/18-08/01/18     Authorization - Visit Number  6    Authorization - Number of Visits  15    OT Start Time  1020    OT Stop Time  1100    OT Time Calculation (min)  40 min    Activity Tolerance  Patient tolerated treatment well    Behavior During Therapy  [facility] for tasks assessed/performed       Past Medical History:  Diagnosis Date  . DDD (degenerative disc disease), lumbar   . Headache   . Other disorders of optic nerve, not elsewhere classified, unspecified eye   . TTP (thrombotic thrombocytopenic purpura) (HCC)     Past Surgical History:  Procedure Laterality Date  . CESAREAN SECTION    . CHOLECYSTECTOMY    . TEE WITHOUT CARDIOVERSION N/A 08/22/2017   Procedure: TRANSESOPHAGEAL ECHOCARDIOGRAM (TEE);  Surgeon: Skeet Latch, MD;  Location: [facility];  Service: Cardiovascular;  Laterality: N/A;  . WISDOM TOOTH EXTRACTION      There were no vitals filed for this visit.  Subjective Assessment - 07/10/18 1026    Subjective   I had another small stroke, but they cleared me to return to therapy. 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   Yes    Pain Score  2     Pain Location  Hand    Pain Orientation  Anterior    Pain Descriptors / Indicators  Headache    Pain Type  Chronic pain         [facility] OT Assessment - 07/10/18 0001      ADL   ADL comments  Pt reports having another CVA on 07/03/18 at Rt parietal lobe (see EPIC to confirm), however pt had tingling/decr. sensation on Rt side (specifically face, throat, tongue, and hand) which cleared by Thurs. Pt has decr. grip strength Rt hand, but improved grip strength Lt hand, and improved coordination both hands (see below). Pt reports she is doing everything at home as before this stroke. Pt cleared to return to therapy      Coordination   Right 9 Hole Peg Test  24.60 sec    Left 9 Hole Peg Test  28.88 sec      Hand Function   Right Hand Grip (lbs)  60 lbs    Left Hand Grip (lbs)  37 lbs               OT Treatments/Exercises (OP) - 07/10/18 0001      Fine Motor Coordination (Hand/Wrist)   Fine Motor Coordination  Small Pegboard    Small Pegboard  Pt  placing small pegs in pegboard Lt hand while copying peg design w/ mod difficulty, double checking herself, and extra time to complete design, but doing so at 100% accuracy and only min difficulty using Lt hand.                OT Short Term Goals - 06/14/18 8127      OT SHORT TERM GOAL #1   Title  Pt to be independent with coordination and putty HEP - 06/11/18    Baseline  Dependent, not yet issued    Time  4    Period  Weeks    Status  Achieved      OT SHORT TERM GOAL #2   Title  Pt to verbalize understanding with safety considerations d/t lack of sensation Lt hand     Baseline  dependent    Time  4    Period  Weeks    Status  Achieved      OT SHORT TERM GOAL #3   Title  Pt to verbalize understanding with memory strategies for financial management and keeping track of appointments/events    Baseline  dependent     Time  4    Period  Weeks    Status  Achieved      OT SHORT TERM GOAL #4    Title  Pt to report greater ease with writing w/ A/E prn and in reasonable amount of time    Baseline  difficulty and extra time required    Time  4    Period  Weeks    Status  Partially Met Pt reports writing is getting easier, but still requires extra time        OT Long Term Goals - 07/10/18 1059      OT LONG TERM GOAL #1   Title  Improve grip strength Lt dominant hand to 25 lbs or greater to assist with opening jars/containers    Baseline  eval: less than 10 lbs (Rt = 70 lbs)    Time  6    Period  Weeks    Status  Achieved 06/14/18 = 33 lbs, 07/10/18: 37 lbs      OT LONG TERM GOAL #2   Title  Pt to improve coordination Lt dominant hand as evidenced by performing 9 hole peg test in 30 sec. or less    Baseline  eval: 37.94 sec    Time  6    Period  Weeks    Status  Achieved 07/10/18: 28.88 sec      OT LONG TERM GOAL #3   Title  Pt to be able to style her daughter's hair sufficiently using both hands    Baseline  dependent at this time    Time  6    Period  Weeks    Status  On-going      OT LONG TERM GOAL #4   Title  Pt to be able to use Lt hand to assist for typing in efficient manner/reasonable amount of time for school related tasks    Baseline  Pt currently using Rt hand and only can use Lt index finger (hunt and peck method)    Time  6    Period  Weeks    Status  On-going      OT LONG TERM GOAL #5   Title  Pt to improve grip strength Lt dominant hand to 45 lbs or greater to open jars/containers    Baseline  eval = less  than 10 lbs, 06/14/18 = 33 lbs    Time  6    Period  Weeks    Status  New            Plan - 07/10/18 1100    Clinical Impression Statement  Pt had a new small stroke (RT parietal lobe) on 07/03/18, however only decline is in Rt grip strength. Pt reports Rt paresthesias resolved. Pt has actually improved bilateral coordination and Lt grip strength. Pt has clearance to return to therapy    Occupational Profile and client history currently impacting  functional performance  Rt cerebellar stroke 08/15/17, chronic migraines    Occupational performance deficits (Please refer to evaluation for details):  IADL's;Education;Work;Social Participation;Leisure;Other;ADL's    Rehab Potential  Good    OT Frequency  2x / week    OT Duration  6 weeks    OT Treatment/Interventions  Self-care/ADL training;Moist Heat;Fluidtherapy;DME and/or AE instruction;Therapeutic activities;Contrast Bath;Therapeutic exercise;Cognitive remediation/compensation;Coping strategies training;Neuromuscular education;Passive range of motion;Paraffin;Manual Therapy;Patient/family education    Plan  continue to work on LUE strength and coordination, also work on Rt grip strength as pt had a slight decline    Consulted and Agree with Plan of Care  Patient       Patient will benefit from skilled therapeutic intervention in order to improve the following deficits and impairments:  Decreased coordination, Decreased endurance, Decreased safety awareness, Impaired sensation, Decreased knowledge of precautions, Decreased activity tolerance, Impaired UE functional use, Pain, Decreased cognition, Decreased strength  Visit Diagnosis: Other lack of coordination  Other symptoms and signs involving cognitive functions following cerebral infarction  Muscle weakness (generalized)    Problem List Patient Active Problem List   Diagnosis Date Noted  . Cerebrovascular accident ([place]) 11/02/2017  . 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 History of TTP (thrombotic thrombocytopenic purpura)   . 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 Numbness on left side 08/16/2017  . Acute left lumbar radiculopathy 10/13/2016  . Chronic migraine 06/01/2016  . Pseudotumor cerebri 11/03/2015  . DENTAL PAIN 06/09/2010  . HEADACHE, TENSION 02/04/2010  . SCABIES 01/13/2010  . FOOT PAIN, LEFT 01/13/2010  . CERUMEN IMPACTION, BILATERAL 11/24/2009   . PSORIASIS 11/24/2009  . Smoker 10/15/2009  . 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 PYELONEPHRITIS 10/01/2009  . THROMBOTIC THROMBOCYTOPENIC PURPURA 12/13/2005    Carey Bullocks, OTR/L 07/10/2018, 12:15 PM  [facility] [address] [place], [place], [address] Phone: [phone]   Fax:  [phone]  Name: SOILA PRINTUP MRN: [id number] Date of Birth: Apr 05, 1986

## 2018-07-12 ENCOUNTER — Ambulatory Visit: Payer: Medicaid Other | Admitting: Occupational Therapy

## 2018-07-12 DIAGNOSIS — I69318 Other symptoms and signs involving cognitive functions following cerebral infarction: Secondary | ICD-10-CM

## 2018-07-12 DIAGNOSIS — M6281 Muscle weakness (generalized): Secondary | ICD-10-CM | POA: Diagnosis not present

## 2018-07-12 DIAGNOSIS — R278 Other lack of coordination: Secondary | ICD-10-CM

## 2018-07-12 NOTE — Therapy (Signed)
[facility] [address] [place], [place], [address] Phone: [phone]   Fax:  [phone]  Occupational Therapy Treatment  Patient Details  Name: Krista Maddox MRN: [id number] Date of Birth: Oct 10, 1986 Referring Provider: Molli Barrows (NP)   Encounter Date: 07/12/2018  OT End of Session - 07/12/18 1005    Visit Number  8    Number of Visits  15    Date for OT Re-Evaluation  07/27/18    Authorization Type  MCD     Authorization Time Period  Approved 3 visits from 05/31/18 - 06/20/18, additional 12 visits approved 06/21/18-08/01/18     Authorization - Visit Number  7    Authorization - Number of Visits  15    OT Start Time  [phone] pt arrived late    OT Stop Time  1017    OT Time Calculation (min)  30 min    Activity Tolerance  Patient tolerated treatment well    Behavior During Therapy  [facility] for tasks assessed/performed       Past Medical History:  Diagnosis Date  . DDD (degenerative disc disease), lumbar   . Headache   . Other disorders of optic nerve, not elsewhere classified, unspecified eye   . TTP (thrombotic thrombocytopenic purpura) (HCC)     Past Surgical History:  Procedure Laterality Date  . CESAREAN SECTION    . CHOLECYSTECTOMY    . TEE WITHOUT CARDIOVERSION N/A 08/22/2017   Procedure: TRANSESOPHAGEAL ECHOCARDIOGRAM (TEE);  Surgeon: Skeet Latch, MD;  Location: [facility];  Service: Cardiovascular;  Laterality: N/A;  . WISDOM TOOTH EXTRACTION      There were no vitals filed for this visit.  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  No/denies                   OT Treatments/Exercises (OP) - 07/12/18 0001      Hand  Exercises   Other Hand Exercises  Gripper set at level 3 resistance to pick up blocks Rt hand for sustained grip strength with min difficulty (d/t slight decr. in strength Rt hand since recent stroke). Gripper set at level 2 resistance to pick up blocks Lt hand w/ mod difficulty/drops      Functional Reaching Activities   High Level  High level reaching LUE to place small pegs in pegboard while copying design for: Lt hand coord., LUE strength/endurance, and visual/perceptual skills.  Pt then removing pegs 5 at a time for in hand manipulation w/ mod difficulty.               OT Short Term Goals - 06/14/18 0932      OT SHORT TERM GOAL #1   Title  Pt to be independent with coordination and putty HEP - 06/11/18    Baseline  Dependent, not yet issued    Time  4    Period  Weeks    Status  Achieved      OT SHORT TERM GOAL #2   Title  Pt to verbalize understanding with safety considerations d/t lack of sensation Lt hand     Baseline  dependent    Time  4    Period  Weeks    Status  Achieved      OT SHORT TERM GOAL #3   Title  Pt to verbalize understanding with memory strategies for financial management and keeping track of appointments/events    Baseline  dependent     Time  4    Period  Weeks    Status  Achieved      OT SHORT TERM GOAL #4   Title  Pt to report greater ease with writing w/ A/E prn and in reasonable amount of time    Baseline  difficulty and extra time required    Time  4    Period  Weeks    Status  Partially Met Pt reports writing is getting easier, but still requires extra time        OT Long Term Goals - 07/10/18 1059      OT LONG TERM GOAL #1   Title  Improve grip strength Lt dominant hand to 25 lbs or greater to assist with opening jars/containers    Baseline  eval: less than 10 lbs (Rt = 70 lbs)    Time  6    Period  Weeks    Status  Achieved 06/14/18 = 33 lbs, 07/10/18: 37 lbs      OT LONG TERM GOAL #2   Title  Pt to improve coordination Lt  dominant hand as evidenced by performing 9 hole peg test in 30 sec. or less    Baseline  eval: 37.94 sec    Time  6    Period  Weeks    Status  Achieved 07/10/18: 28.88 sec      OT LONG TERM GOAL #3   Title  Pt to be able to style her daughter's hair sufficiently using both hands    Baseline  dependent at this time    Time  6    Period  Weeks    Status  On-going      OT LONG TERM GOAL #4   Title  Pt to be able to use Lt hand to assist for typing in efficient manner/reasonable amount of time for school related tasks    Baseline  Pt currently using Rt hand and only can use Lt index finger (hunt and peck method)    Time  6    Period  Weeks    Status  On-going      OT LONG TERM GOAL #5   Title  Pt to improve grip strength Lt dominant hand to 45 lbs or greater to open jars/containers    Baseline  eval = less than 10 lbs, 06/14/18 = 33 lbs    Time  6    Period  Weeks    Status  New            Plan - 07/12/18 1007    Clinical Impression Statement  Pt continuing to make progress despite recent CVA. Pt progressing towards goals    Occupational Profile and client history currently impacting functional performance  Rt cerebellar stroke 08/15/17, chronic migraines    Occupational performance deficits (Please refer to evaluation for details):  IADL's;Education;Work;Social Participation;Leisure;Other;ADL's    Rehab Potential  Good    OT Frequency  2x / week    OT Duration  6 weeks    OT Treatment/Interventions  Self-care/ADL training;Moist Heat;Fluidtherapy;DME and/or AE instruction;Therapeutic activities;Contrast Bath;Therapeutic exercise;Cognitive remediation/compensation;Coping strategies training;Neuromuscular education;Passive range of motion;Paraffin;Manual Therapy;Patient/family education    Plan  practice braiding, typing, handwriting    Consulted and Agree with Plan of Care  Patient       Patient will benefit from skilled therapeutic intervention in order to improve the following  deficits and impairments:  Decreased coordination, Decreased endurance, Decreased safety awareness, Impaired sensation, Decreased knowledge of precautions, Decreased activity tolerance, Impaired UE functional use, Pain, Decreased cognition, Decreased strength  Visit Diagnosis: Other lack of coordination  Muscle weakness (generalized)  Other symptoms and signs involving cognitive functions following cerebral infarction    Problem List Patient Active Problem List   Diagnosis Date Noted  . Cerebrovascular accident ([place]) 11/02/2017  . 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 History of TTP (thrombotic thrombocytopenic purpura)   . 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 Numbness on left side 08/16/2017  . Acute left lumbar radiculopathy 10/13/2016  . Chronic migraine 06/01/2016  . Pseudotumor cerebri 11/03/2015  . DENTAL PAIN 06/09/2010  . HEADACHE, TENSION 02/04/2010  . SCABIES 01/13/2010  . FOOT PAIN, LEFT 01/13/2010  . CERUMEN IMPACTION, BILATERAL 11/24/2009  . PSORIASIS 11/24/2009  . Smoker 10/15/2009  . 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 PYELONEPHRITIS 10/01/2009  . THROMBOTIC THROMBOCYTOPENIC PURPURA 12/13/2005    Carey Bullocks, OTR/L 07/12/2018, 10:09 AM  [facility] [address] [place], [place], [address] Phone: [phone]   Fax:  [phone]  Name: Krista Maddox MRN: [id number] Date of Birth: [birth year]

## 2018-07-26 ENCOUNTER — Encounter: Payer: Self-pay | Admitting: Family Medicine

## 2018-07-26 ENCOUNTER — Ambulatory Visit (INDEPENDENT_AMBULATORY_CARE_PROVIDER_SITE_OTHER): Payer: Medicaid Other | Admitting: Family Medicine

## 2018-07-26 ENCOUNTER — Telehealth: Payer: Self-pay | Admitting: Neurology

## 2018-07-26 VITALS — BP 128/82 | HR 98 | Temp 98.1°F | Ht 65.0 in | Wt 216.0 lb

## 2018-07-26 DIAGNOSIS — Z23 Encounter for immunization: Secondary | ICD-10-CM

## 2018-07-26 DIAGNOSIS — IMO0002 Reserved for concepts with insufficient information to code with codable children: Secondary | ICD-10-CM

## 2018-07-26 DIAGNOSIS — G43709 Chronic migraine without aura, not intractable, without status migrainosus: Secondary | ICD-10-CM

## 2018-07-26 DIAGNOSIS — Z0000 Encounter for general adult medical examination without abnormal findings: Secondary | ICD-10-CM | POA: Diagnosis not present

## 2018-07-26 DIAGNOSIS — Z09 Encounter for follow-up examination after completed treatment for conditions other than malignant neoplasm: Secondary | ICD-10-CM | POA: Diagnosis not present

## 2018-07-26 LAB — POCT URINALYSIS DIP (MANUAL ENTRY)
Bilirubin, UA: NEGATIVE
Glucose, UA: NEGATIVE mg/dL
Ketones, POC UA: NEGATIVE mg/dL
Leukocytes, UA: NEGATIVE
Nitrite, UA: NEGATIVE
Protein Ur, POC: NEGATIVE mg/dL
Spec Grav, UA: 1.02 (ref 1.010–1.025)
Urobilinogen, UA: 0.2 E.U./dL
pH, UA: 5.5 (ref 5.0–8.0)

## 2018-07-26 MED ORDER — KETOROLAC TROMETHAMINE 60 MG/2ML IM SOLN
60.0000 mg | Freq: Once | INTRAMUSCULAR | Status: AC
  Administered 2018-07-26: 60 mg via INTRAMUSCULAR

## 2018-07-26 NOTE — Telephone Encounter (Addendum)
Cayarel RN with the pts [phone])  called wanting to schedule the pt for a sooner due to a recent stroke on 7/22. Stating the pt is experiencing the same symptoms from before her stroke such as constant h/a. Requesting a call to schedule pt with NP or MD.   Right after [facility] requesting a call at [phone] to advise the pt on what to do until we are able to get pt in for sooner appt. Please call to advise.

## 2018-07-26 NOTE — Telephone Encounter (Signed)
I have reviewed this information with Dr. Terrace ArabiaYan.  She would like the patient worked-in with NP.  She has been scheduled to see Shanda BumpsJessica on 07/27/18 at 8:45am (arrival time 8:15am).  I have spoken to the patient and she is aware of the time.  States her headaches has eased up after getting a shot of Toradol.

## 2018-07-26 NOTE — Progress Notes (Signed)
Hospital Follow Up  Subjective:    Patient ID: Krista Maddox, female    DOB: 1986/07/20, 32 y.o.   MRN: [id number]   Chief Complaint  Patient presents with  . Hospitalization Follow-up   HPI  [name] has a past medical history of TTP, Headache, and DDD. She is here today for follow up.   Current Status: Since her last office visit, she is s/p: Stroke on 07/03/2018. She previously had Stroke on 01/18/2018 . Today, she reports that she has had a Migraine headache for 3 days, with no relief.  States that medications prescribed are not effective. She says that she is has not followed up with Dr.Yan since her last stroke. She has not had any headaches, visual changes, dizziness, blurred vision, unsteadiness, and falls.   She denies fevers, chills, fatigue, recent infections, weight loss, and night sweats.   No chest pain, heart palpitations, cough and shortness of breath reported.   No reports of GI problems such as nausea, vomiting, diarrhea, and constipation. She has no reports of blood in stools, dysuria and hematuria.   No depression or anxiety reported.   Past Medical History:  Diagnosis Date  . DDD (degenerative disc disease), lumbar   . Headache   . Other disorders of optic nerve, not elsewhere classified, unspecified eye   . TTP (thrombotic thrombocytopenic purpura) (HCC)     Family History  Problem Relation Age of Onset  . Diabetes Mother   . Hypertension Mother   . Ovarian cancer Mother        Hysterectomy  . Healthy Father   . Ovarian cancer Maternal Grandmother        Hysterectomy    Social History   Socioeconomic History  . Marital status: Single    Spouse name: Not on file  . Number of children: 1  . Years of education: In college  . Highest education level: Not on file  Occupational History  . Occupation: Science writerDispatcher  Social Needs  . Financial resource strain: Not on file  . Food insecurity:    Worry: Not on file    Inability: Not on file  .  Transportation needs:    Medical: Not on file    Non-medical: Not on file  Tobacco Use  . Smoking status: Current Every Day Smoker    Packs/day: 0.50  . Smokeless tobacco: Never Used  Substance and Sexual Activity  . Alcohol use: No    Alcohol/week: 0.0 standard drinks  . Drug use: No  . Sexual activity: Yes    Birth control/protection: Pill  Lifestyle  . Physical activity:    Days per week: Not on file    Minutes per session: Not on file  . Stress: Not on file  Relationships  . Social connections:    Talks on phone: Not on file    Gets together: Not on file    Attends religious service: Not on file    Active member of club or organization: Not on file    Attends meetings of clubs or organizations: Not on file    Relationship status: Not on file  . Intimate partner violence:    Fear of current or ex partner: Not on file    Emotionally abused: Not on file    Physically abused: Not on file    Forced sexual activity: Not on file  Other Topics Concern  . Not on file  Social History Narrative   Lives at home with her mother and daughter.  3-4 cups caffeine daily.   Left-handed.    Past Surgical History:  Procedure Laterality Date  . CESAREAN SECTION    . CHOLECYSTECTOMY    . TEE WITHOUT CARDIOVERSION N/A 08/22/2017   Procedure: TRANSESOPHAGEAL ECHOCARDIOGRAM (TEE);  Surgeon: Chilton Siandolph, Tiffany, MD;  [facility];  Service: Cardiovascular;  Laterality: N/A;  . WISDOM TOOTH EXTRACTION      Immunization History  Administered Date(s) Administered  . Hepatitis B, ped/adol 01/03/2014  . Influenza Whole 10/15/2009  . Influenza,inj,Quad PF,6+ Mos 07/26/2018  . Influenza-Unspecified 08/22/2017  . PPD Test 01/03/2014  . Td 12/14/2003    BP 128/82 (BP Location: Left Arm, Patient Position: Sitting, Cuff Size: Large)   Pulse 98   Temp 98.1 F (36.7 C) (Oral)   Ht 5\' 5"  (1.651 m)   Wt 216 lb (98 kg)   LMP  (LMP Unknown)   SpO2 100%   BMI 35.94 kg/m   Review  of Systems  Gastrointestinal: Positive for nausea (r/t headaches).  Musculoskeletal: Negative.   Skin: Negative.   Neurological: Positive for dizziness and headaches (Migraines).  Hematological: Negative.   Psychiatric/Behavioral: Positive for agitation. The patient is nervous/anxious.    Objective:   Physical Exam  Constitutional: She is oriented to person, place, and time. She appears well-developed and well-nourished.  HENT:  Head: Normocephalic and atraumatic.  Right Ear: External ear normal.  Left Ear: External ear normal.  Nose: Nose normal.  Mouth/Throat: Oropharynx is clear and moist.  Eyes: Pupils are equal, round, and reactive to light. Conjunctivae and EOM are normal.  Neck: Normal range of motion. Neck supple.  Cardiovascular: Normal rate, regular rhythm, normal heart sounds and intact distal pulses.  Pulmonary/Chest: Effort normal and breath sounds normal.  Abdominal: Soft. Bowel sounds are normal.  Musculoskeletal: Normal range of motion.  Neurological: She is alert and oriented to person, place, and time.  Skin: Skin is warm and dry. Capillary refill takes less than 2 seconds.  Psychiatric: She has a normal mood and affect. Judgment and thought content normal.  Anxious    Assessment & Plan:   1. Chronic migraine Toradol injection given in office today, and provided patient with some relief. We have consulted with Dr. Zannie CoveYan's office for recommendations of appropriate med for patient's migraine. We have called office to schedule a follow up for her as soon as possible. She is to continue medications for headaches as prescribed.   Patient is advised to report to ED if she experiences intractable migraine, signs or symptoms of stroke, or blurred vision/unsteadiness. Patient verifies understanding. She will avoid stressful situations, remain well-hydrated, and get required nutrition. We will contact patient once we hear from Dr. Zannie CoveYan's office.  - ketorolac (TORADOL)  injection 60 mg  2. Need for immunization against influenza - Flu Vaccine QUAD 36+ mos IM  3. Healthcare maintenance - CBC with Differential - Comprehensive metabolic panel - TSH - Lipid Panel  4. Follow up - POCT urinalysis dipstick  Meds ordered this encounter  Medications  . ketorolac (TORADOL) injection 60 mg   Raliegh IpNatalie Ariella Voit,  MSN, FNP-C Patient [facility] [address]  [place], [place] [id number] [phone]

## 2018-07-26 NOTE — Patient Instructions (Signed)

## 2018-07-26 NOTE — Telephone Encounter (Signed)
Reached out to [facility] to get patient in for a appointment as soon as possible. Patient has had a intractable headache for 72 hours. Patient had another stroke on 07/03/2018. They state nurse will have to call patient back because they don't have any appointments at this time.

## 2018-07-27 ENCOUNTER — Encounter: Payer: Self-pay | Admitting: Adult Health

## 2018-07-27 ENCOUNTER — Telehealth: Payer: Self-pay | Admitting: Adult Health

## 2018-07-27 ENCOUNTER — Ambulatory Visit: Payer: Medicaid Other | Admitting: Adult Health

## 2018-07-27 VITALS — BP 118/79 | HR 107 | Wt 217.0 lb

## 2018-07-27 DIAGNOSIS — G43709 Chronic migraine without aura, not intractable, without status migrainosus: Secondary | ICD-10-CM | POA: Diagnosis not present

## 2018-07-27 DIAGNOSIS — I10 Essential (primary) hypertension: Secondary | ICD-10-CM | POA: Diagnosis not present

## 2018-07-27 DIAGNOSIS — IMO0002 Reserved for concepts with insufficient information to code with codable children: Secondary | ICD-10-CM

## 2018-07-27 DIAGNOSIS — E785 Hyperlipidemia, unspecified: Secondary | ICD-10-CM

## 2018-07-27 DIAGNOSIS — I6389 Other cerebral infarction: Secondary | ICD-10-CM | POA: Diagnosis not present

## 2018-07-27 LAB — CBC WITH DIFFERENTIAL/PLATELET
Basophils Absolute: 0 10*3/uL (ref 0.0–0.2)
Basos: 0 %
EOS (ABSOLUTE): 0.2 10*3/uL (ref 0.0–0.4)
Eos: 2 %
Hematocrit: 41.5 % (ref 34.0–46.6)
Hemoglobin: 13.9 g/dL (ref 11.1–15.9)
Immature Grans (Abs): 0 10*3/uL (ref 0.0–0.1)
Immature Granulocytes: 0 %
Lymphocytes Absolute: 2.9 10*3/uL (ref 0.7–3.1)
Lymphs: 35 %
MCH: 31.3 pg (ref 26.6–33.0)
MCHC: 33.5 g/dL (ref 31.5–35.7)
MCV: 94 fL (ref 79–97)
Monocytes Absolute: 0.4 10*3/uL (ref 0.1–0.9)
Monocytes: 5 %
Neutrophils Absolute: 4.9 10*3/uL (ref 1.4–7.0)
Neutrophils: 58 %
Platelets: 244 10*3/uL (ref 150–450)
RBC: 4.44 x10E6/uL (ref 3.77–5.28)
RDW: 13.2 % (ref 12.3–15.4)
WBC: 8.5 10*3/uL (ref 3.4–10.8)

## 2018-07-27 LAB — COMPREHENSIVE METABOLIC PANEL
ALT: 11 IU/L (ref 0–32)
AST: 11 IU/L (ref 0–40)
Albumin/Globulin Ratio: 1.4 (ref 1.2–2.2)
Albumin: 4.1 g/dL (ref 3.5–5.5)
Alkaline Phosphatase: 74 IU/L (ref 39–117)
BUN/Creatinine Ratio: 12 (ref 9–23)
BUN: 10 mg/dL (ref 6–20)
Bilirubin Total: <0.2 mg/dL (ref 0.0–1.2)
CO2: 21 mmol/L (ref 20–29)
Calcium: 8.9 mg/dL (ref 8.7–10.2)
Chloride: 107 mmol/L — ABNORMAL HIGH (ref 96–106)
Creatinine, Ser: 0.82 mg/dL (ref 0.57–1.00)
GFR calc Af Amer: 109 mL/min/{1.73_m2} (ref >59)
GFR calc non Af Amer: 95 mL/min/{1.73_m2} (ref >59)
Globulin, Total: 3 g/dL (ref 1.5–4.5)
Glucose: 72 mg/dL (ref 65–99)
Potassium: 3.8 mmol/L (ref 3.5–5.2)
Sodium: 141 mmol/L (ref 134–144)
Total Protein: 7.1 g/dL (ref 6.0–8.5)

## 2018-07-27 LAB — LIPID PANEL
Chol/HDL Ratio: 5.2 ratio — ABNORMAL HIGH (ref 0.0–4.4)
Cholesterol, Total: 181 mg/dL (ref 100–199)
HDL: 35 mg/dL — ABNORMAL LOW (ref >39)
LDL Calculated: 116 mg/dL — ABNORMAL HIGH (ref 0–99)
Triglycerides: 152 mg/dL — ABNORMAL HIGH (ref 0–149)
VLDL Cholesterol Cal: 30 mg/dL (ref 5–40)

## 2018-07-27 LAB — TSH: TSH: 3.14 u[IU]/mL (ref 0.450–4.500)

## 2018-07-27 MED ORDER — ASPIRIN EC 325 MG PO TBEC: 325.0000 mg | DELAYED_RELEASE_TABLET | Freq: Every day | ORAL | 0 refills | Status: DC

## 2018-07-27 MED ORDER — ATORVASTATIN CALCIUM 40 MG PO TABS: 40.0000 mg | ORAL_TABLET | Freq: Every day | ORAL | 3 refills | Status: DC

## 2018-07-27 NOTE — Patient Instructions (Signed)
Increase aspirin 325 mg daily  and increase lipitor from 10mg  to 40mg   for secondary stroke prevention  Continue to follow up with PCP regarding cholesterol and blood pressure management   You will receive infusion today for continued migraine  We will check A1c today to monitor for diabetes  Continue to monitor blood pressure at home  Maintain strict control of hypertension with blood pressure goal below 130/90, diabetes with hemoglobin A1c goal below 6.5% and cholesterol with LDL cholesterol (bad cholesterol) goal below 70 mg/dL. I also advised the patient to eat a healthy diet with plenty of whole grains, cereals, fruits and vegetables, exercise regularly and maintain ideal body weight.  Followup in 3-4 weeks with Dr. Terrace ArabiaYan for botox injections or call earlier if needed         Thank you for [facility]. I hope we have been able to provide you high quality care today.  You may receive a patient satisfaction survey over the next few weeks. We would appreciate your feedback and comments so that we may continue to improve ourselves and the health of our patients.

## 2018-07-27 NOTE — Progress Notes (Signed)
I have reviewed and agreed above plan. 

## 2018-07-27 NOTE — Progress Notes (Signed)
[facility] [address] [place]. [place] [address] [phone]       OFFICE FOLLOW UP NOTE  [name] Date of Birth:  Apr 08, 1986 Medical Record Number:  [id number]   Reason for Referral:  stroke  CHIEF COMPLAINT:  Chief Complaint  Patient presents with  . Follow-up    Stroke and Headache follow up pt alone    HPI: Krista Maddox is being seen today in the office for TIA on 07/03/18. History obtained from patient and chart review. Reviewed all radiology images and labs personally.  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 Terrace Arabia in this office who presented to [facility] health after abrupt onset of right hand and face numbness.  Per notes, patient reports symptoms were brief in duration and resolved during work and then symptoms returned while she was driving home.  She denied any headache or acute vision changes per notes at that time.  MRI head showed tiny nonhemorrhagic subcortical infarcts of the high right parietal lobe (unable to visualize films).  2D echo showed normal EF.  Patient states all symptoms had resolved prior to being discharged from the hospital.  She remains taking aspirin 81 mg which she was on prior.  At today's appointment, patient states that prior to this event occurring she had a week long migraine which resolved 2 days prior to having right hand and face numbness.  She continues to have migraines proximal he 3-4 times per week that may last all day or can last up to a few days.  She currently has had a migraine for the past 4 days which are located in the frontal region of her head with photophobia and nausea.  She states she is compliant on Topamax, tizanidine and nortriptyline but she continues to have frequent migraines.  LDL was recently checked by PCP at 116 and patient states she continues to take Lipitor 10  mg daily.  Blood pressure is satisfactory at 118/79.  After speaking to Dr. Terrace Arabia, it was determined to have patient receive infusion in the clinic today for continued current migraine and to schedule follow-up appointment in 3 to 4 weeks time for Botox injections.  Right hand and face numbness high suspicion to be correlated with her migraines as the symptoms do not correlate with a right parietal lobe infarct.  As patient did have an incidental acute infarct finding, recommended to increase aspirin from 81 mg to 325 mg and to increase Lipitor from 10 mg to 40 mg due to continued high LDL levels.  Patient did undergo CTA of head and neck in 01/2018 which was negative.   ROS:   14 system review of systems performed and negative with exception of fatigue, light sensitivity, cold intolerance, insomnia, daytime sleepiness, back pain, dizziness, headache, numbness and weakness  PMH:  Past Medical History:  Diagnosis Date  . DDD (degenerative disc disease), lumbar   . Headache   . Other disorders of optic nerve, not elsewhere classified, unspecified eye   . Stroke (HCC)   . TTP (thrombotic thrombocytopenic purpura) (HCC)     PSH:  Past Surgical History:  Procedure Laterality Date  . CESAREAN SECTION    . CHOLECYSTECTOMY    . TEE WITHOUT CARDIOVERSION N/A 08/22/2017   Procedure: TRANSESOPHAGEAL ECHOCARDIOGRAM (TEE);  Surgeon: Chilton Si, MD;  Location: [facility];  Service: Cardiovascular;  Laterality: N/A;  .  WISDOM TOOTH EXTRACTION      Social History:  Social History   Socioeconomic History  . Marital status: Single    Spouse name: Not on file  . Number of children: 1  . Years of education: In college  . Highest education level: Not on file  Occupational History  . Occupation: Science writer  Social Needs  . Financial resource strain: Not on file  . Food insecurity:    Worry: Not on file    Inability: Not on file  . Transportation needs:    Medical: Not on file     Non-medical: Not on file  Tobacco Use  . Smoking status: Former Smoker    Packs/day: 0.50    Last attempt to quit: 08/27/2017    Years since quitting: 0.9  . Smokeless tobacco: Never Used  Substance and Sexual Activity  . Alcohol use: No    Alcohol/week: 0.0 standard drinks  . Drug use: No  . Sexual activity: Yes    Birth control/protection: Pill  Lifestyle  . Physical activity:    Days per week: Not on file    Minutes per session: Not on file  . Stress: Not on file  Relationships  . Social connections:    Talks on phone: Not on file    Gets together: Not on file    Attends religious service: Not on file    Active member of club or organization: Not on file    Attends meetings of clubs or organizations: Not on file    Relationship status: Not on file  . Intimate partner violence:    Fear of current or ex partner: Not on file    Emotionally abused: Not on file    Physically abused: Not on file    Forced sexual activity: Not on file  Other Topics Concern  . Not on file  Social History Narrative   Lives at home with her mother and daughter.   3-4 cups caffeine daily.   Left-handed.    Family History:  Family History  Problem Relation Age of Onset  . Diabetes Mother   . Hypertension Mother   . Ovarian cancer Mother        Hysterectomy  . Healthy Father   . Ovarian cancer Maternal Grandmother        Hysterectomy    Medications:   Current Outpatient Medications on File Prior to Visit  Medication Sig Dispense Refill  . acetaminophen (TYLENOL) 325 MG tablet Take 650 mg by mouth every 6 (six) hours as needed.    Marland Kitchen co-enzyme Q-10 50 MG capsule Take 50 mg by mouth 2 (two) times daily.    . Diclofenac Potassium (CAMBIA) 50 MG PACK Take 50 mg by mouth daily as needed. 20 each 11  . gabapentin (NEURONTIN) 300 MG capsule TAKE 1 CAPSULE BY MOUTH TWICE A DAY MAY INCREASE TO 2 CAPS TWICE DAILY IF NEEDED 90 capsule 4  . Magnesium Oxide 400 (240 Mg) MG TABS Take 400 mg by mouth 2  (two) times daily. 60 tablet 11  . meloxicam (MOBIC) 15 MG tablet TAKE ONE TABLET DAILY AS NEEDED WITH FOOD 90 tablet 0  . nortriptyline (PAMELOR) 50 MG capsule TAKE 1 CAPSULE BY MOUTH EVERY DAY AT BEDTIME 90 capsule 4  . ondansetron (ZOFRAN ODT) 4 MG disintegrating tablet Take 1 tablet (4 mg total) by mouth every 8 (eight) hours as needed for nausea or vomiting. 30 tablet 11  . Riboflavin 100 MG TABS Take 1 tablet (100  mg total) by mouth 2 (two) times daily. 60 tablet 11  . tiZANidine (ZANAFLEX) 4 MG tablet Take 1 tablet (4 mg total) by mouth every 6 (six) hours as needed for muscle spasms. 30 tablet 0  . topiramate (TOPAMAX) 100 MG tablet Take 1.5 tablets (150 mg total) by mouth 2 (two) times daily. TAKE 150MG  BY MOUTH TWICE DAILY 270 tablet 4  . venlafaxine XR (EFFEXOR-XR) 75 MG 24 hr capsule Take 1 capsule (75 mg total) by mouth daily with breakfast. 30 capsule 11   No current facility-administered medications on file prior to visit.     Allergies:   Allergies  Allergen Reactions  . Tape Rash    Cannot tolerate paper tape     Physical Exam  Vitals:   07/27/18 0832  BP: 118/79  Pulse: (!) 107  Weight: 217 lb (98.4 kg)   Body mass index is 36.11 kg/m. No exam data present  General: Obese young Caucasian female, seated, in no evident distress Head: head normocephalic and atraumatic.   Neck: supple with no carotid or supraclavicular bruits Cardiovascular: regular rate and rhythm, no murmurs Musculoskeletal: no deformity Skin:  no rash/petichiae Vascular:  Normal pulses all extremities  Neurologic Exam Mental Status: Awake and fully alert. Oriented to place and time. Recent and remote memory intact. Attention span, concentration and fund of knowledge appropriate. Mood and affect appropriate.  Cranial Nerves: Fundoscopic exam reveals sharp disc margins. Pupils equal, briskly reactive to light. Extraocular movements full without nystagmus. Visual fields full to confrontation.  Hearing intact. Facial sensation intact. Face, tongue, palate moves normally and symmetrically.  Motor: Normal bulk and tone. Normal strength in all tested extremity muscles mild left sided weakness (chronic) Sensory.: intact to touch , pinprick , position and vibratory sensation.  Coordination: Rapid alternating movements normal in all extremities. Finger-to-nose and heel-to-shin performed accurately bilaterally. Gait and Station: Arises from chair without difficulty. Stance is normal. Gait demonstrates normal stride length and balance . Able to heel, toe and tandem walk without difficulty.  Reflexes: 1+ and symmetric. Toes downgoing.    NIHSS  0 Modified Rankin  0       ASSESSMENT: Krista FickHeather L Maddox is a 32 y.o. year old female here with incidental finding of right parietal infarct along with continuous migraines after having right hand and face numbness on 07/03/2018. Vascular risk factors include HTN and HLD.  High suspicion for right-sided symptoms related to migraine as symptoms and right parietal infarct do not correlate.    PLAN: -As patient was previously on 81 mg prior to recent stroke, recommend to increase to aspirin 325 -Recent LDL 116 and his patient was on Lipitor 10 mg prior recommend to increase this to Lipitor 40 mg daily and have PCP follow-up in 4 to 6 weeks as LDL goal less than 70 -Check A1c at today's visit -F/u with PCP regarding your HLD and HTN management -Will receive infusion for continued migraine in clinic today -Encourage weight loss with continued exercise and healthy diet -continue to monitor BP at home -Maintain strict control of hypertension with blood pressure goal below 130/90, diabetes with hemoglobin A1c goal below 6.5% and cholesterol with LDL cholesterol (bad cholesterol) goal below 70 mg/dL. I also advised the patient to eat a healthy diet with plenty of whole grains, cereals, fruits and vegetables, exercise regularly and maintain ideal body  weight.  Follow up in 3 to 4 weeks for Botox injections with Dr. Terrace ArabiaYan or call earlier if needed   Greater  than 50% of time during this 25 minute visit was spent on counseling,explanation of diagnosis of incidental right parietal infarct and migraines, reviewing risk factor management of HLD and HTN, planning of further management, discussion with patient and family and coordination of care    George HughJessica Khalon Cansler, [facility] LLCGNP-BC  [facility] [address] [place], [place] [id number]  Phone [phone] Fax [phone]

## 2018-07-27 NOTE — Telephone Encounter (Signed)
3-4 weeks botox

## 2018-07-28 LAB — HEMOGLOBIN A1C
Est. average glucose Bld gHb Est-mCnc: 100 mg/dL
Hgb A1c MFr Bld: 5.1 % (ref 4.8–5.6)

## 2018-07-28 NOTE — Telephone Encounter (Signed)
Marcelino DusterMichelle, is Dr. Terrace ArabiaYan starting Botox for this patient again?

## 2018-07-31 NOTE — Telephone Encounter (Signed)
Patient would also like to know what she can do in the meantime. She will not be available by phone until tomorrow. Please call and advise.

## 2018-07-31 NOTE — Telephone Encounter (Signed)
I called to schedule the patient, Marcelino DusterMichelle could we put this patient in for September 23rd at 11:00am?

## 2018-07-31 NOTE — Telephone Encounter (Signed)
I was able to get in touch with the patient today.  She will use her home medications until her Botox appt, if she develops another migraine. She is aware her appt is 09/04/18 at 11am.  She will arrived to our office at 10:45am for check-in.

## 2018-08-01 ENCOUNTER — Encounter: Payer: Self-pay | Admitting: Occupational Therapy

## 2018-08-01 ENCOUNTER — Ambulatory Visit: Payer: Medicaid Other | Attending: Family Medicine | Admitting: Occupational Therapy

## 2018-08-01 DIAGNOSIS — M6281 Muscle weakness (generalized): Secondary | ICD-10-CM | POA: Diagnosis present

## 2018-08-01 DIAGNOSIS — R208 Other disturbances of skin sensation: Secondary | ICD-10-CM | POA: Insufficient documentation

## 2018-08-01 DIAGNOSIS — I69318 Other symptoms and signs involving cognitive functions following cerebral infarction: Secondary | ICD-10-CM | POA: Diagnosis present

## 2018-08-01 DIAGNOSIS — R278 Other lack of coordination: Secondary | ICD-10-CM | POA: Diagnosis not present

## 2018-08-01 NOTE — Telephone Encounter (Signed)
[facility]

## 2018-08-01 NOTE — Patient Instructions (Signed)
  1. Grip Strengthening (Resistive Putty)   Squeeze putty using thumb and all fingers. Repeat _15-20__ times. Do __2__ sessions per day.   2. Roll putty into tube on table and pinch between first 2 fingers and thumb x 10 reps. Do 2 sessions per day.        Coordination Activities  Perform the following activities for 15 minutes 1-2 times per day with left hand(s).   Rotate ball in fingertips (clockwise and counter-clockwise x 3 full revolutions each way).  Toss and catch ball with Lt hand x 10 times  Flip cards 1 at a time as fast as you can.  Deal cards with your thumb (Hold deck in hand and push card off top with thumb).  Rotate 1 card in hand (clockwise and counter-clockwise x 3 times each way). Draw arrow on 1 card and set aside just for this exercise  Pick up 10 pennies and place in container or coin bank. Use index finger and thumb. Do NOT slide along table  Pick up coins one at a time until you get 5 in your hand, then move coins from palm to fingertips to stack one at a time (Do 3 stacks of 5).  Practice writing and typing.        SAFETY CONSIDERATIONS:   - Always test temperature of water with Rt hand (before washing hands, dishes, showering, etc)  - Do NOT use Lt hand for anything: heavy, sharp, hot, or breakable (for example - carry coffee in Rt hand in travel mug w/ lid, use chopper or buy veggies already cut, carry heavier and/or breakable objects in Rt hand)  - Use Lt hand for SAFE light weight things (Ask yourself if I drop this, will it hurt me. If no, then use Lt hand!) But use your vision to compensate for lack of feeling (I.e. LOOK AT YOUR HAND)

## 2018-08-01 NOTE — Therapy (Signed)
[facility] [address] [place], [place], [address] Phone: [phone]   Fax:  [phone]  Occupational Therapy Treatment  Patient Details  Name: Krista Maddox MRN: [id number] Date of Birth: [birth year] Referring Provider: Molli Barrows (NP)   Encounter Date: 08/01/2018  OT End of Session - 08/01/18 1014    Visit Number  9    Number of Visits  15    Date for OT Re-Evaluation  08/01/18   end date from Medicaid   Authorization Type  MCD     Authorization Time Period  Approved 3 visits from 05/31/18 - 06/20/18, additional 12 visits approved 06/21/18-08/01/18     Authorization - Visit Number  9    Authorization - Number of Visits  15    OT Start Time  0930    OT Stop Time  1003    OT Time Calculation (min)  33 min    Activity Tolerance  Patient tolerated treatment well       Past Medical History:  Diagnosis Date  . DDD (degenerative disc disease), lumbar   . Headache   . Other disorders of optic nerve, not elsewhere classified, unspecified eye   . Stroke ([facility])   . TTP (thrombotic thrombocytopenic purpura) (HCC)     Past Surgical History:  Procedure Laterality Date  . CESAREAN SECTION    . CHOLECYSTECTOMY    . TEE WITHOUT CARDIOVERSION N/A 08/22/2017   Procedure: TRANSESOPHAGEAL ECHOCARDIOGRAM (TEE);  Surgeon: Skeet Latch, MD;  Location: [facility];  Service: Cardiovascular;  Laterality: N/A;  . WISDOM TOOTH EXTRACTION      There were no vitals filed for this visit.  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  Yes    Pain Score  3      Pain Location  Head    Pain Orientation  Anterior    Pain Descriptors / Indicators  Throbbing    Pain Type  Acute pain    Pain Onset  Today   woke up with it   Pain Frequency  Constant    Aggravating Factors   nothing    Pain Relieving Factors  nothing                   OT Treatments/Exercises (OP) - 08/01/18 0001      ADLs   ADL Comments  Checked remaining goals - see goal section for updates. Pt has met 3/4 STG's and 2/5 LTG's.  Pt had decided that she wishes to discharge from OT and continue to work on L hand at home with HEP.  Stressed using L hand functionally in safe way (pt has no sensation in L hand) as much as possible.  Pt's coordination has improved and pt states she is using the L hand slightly more than she was before.  Discussed importance of using L hand functionally and using vision to compensate when using hand. Pt verblized understanding.       Hand Exercises   Other Hand Exercises  Reviewed HEP with pt.  Pt needed mod vc's to fully use L hand when  working on grip/pinch with red putty. After instruction and practice pt able to return demonstrate both efficient grip and pinch.  Pt reported she was doing almost daily however not doing enough repetition ( I think 10 - HEP called for 15-20 twice daily) and without enough effort.  Reviewed repetitions and suggested pt pair program with breakfast and dinner as reminder.  Pt reported she has been doing coordination program and coordination has improved (see goal section).              OT Education - 08/01/18 1010    Education Details  reviewed and reissued HEP for grip/pinch strength and coordination    Person(s) Educated  Patient    Methods  Explanation;Handout;Verbal cues    Comprehension  Verbalized understanding;Returned demonstration       OT Short Term Goals - 08/01/18 1010      OT SHORT TERM GOAL #1   Title  Pt to be independent with coordination and putty HEP - 06/11/18    Baseline  Dependent,  not yet issued    Time  4    Period  Weeks    Status  Achieved      OT SHORT TERM GOAL #2   Title  Pt to verbalize understanding with safety considerations d/t lack of sensation Lt hand     Baseline  dependent    Time  4    Period  Weeks    Status  Achieved      OT SHORT TERM GOAL #3   Title  Pt to verbalize understanding with memory strategies for financial management and keeping track of appointments/events    Baseline  dependent     Time  4    Period  Weeks    Status  Achieved      OT SHORT TERM GOAL #4   Title  Pt to report greater ease with writing w/ A/E prn and in reasonable amount of time    Baseline  difficulty and extra time required    Time  4    Period  Weeks    Status  Partially Met   Pt reports writing is getting easier, but still requires extra time       OT Long Term Goals - 08/01/18 1010      OT LONG TERM GOAL #1   Title  Improve grip strength Lt dominant hand to 25 lbs or greater to assist with opening jars/containers    Baseline  eval: less than 10 lbs (Rt = 70 lbs)    Time  6    Period  Weeks    Status  Achieved   06/14/18 = 33 lbs, 07/10/18: 37 lbs     OT LONG TERM GOAL #2   Title  Pt to improve coordination Lt dominant hand as evidenced by performing 9 hole peg test in 30 sec. or less    Baseline  eval: 37.94 sec    Time  6    Period  Weeks    Status  Achieved   08/01/2018  24.96     OT LONG TERM GOAL #3   Title  Pt to be able to style her daughter's hair sufficiently using both hands    Baseline  dependent at this time    Time  6    Period  Weeks    Status  Not Met      OT LONG TERM GOAL #4   Title  Pt to be able to use Lt  hand to assist for typing in efficient manner/reasonable amount of time for school related tasks    Baseline  Pt currently using Rt hand and only can use Lt index finger (hunt and peck method)    Time  6    Period  Weeks    Status  Not Met      OT LONG TERM GOAL #5   Title  Pt to improve grip strength Lt dominant  hand to 45 lbs or greater to open jars/containers    Baseline  eval = less than 10 lbs, 06/14/18 = 33 lbs    Time  6    Period  Weeks    Status  Not Met   08/01/2018  31 pounds           Plan - 08/01/18 1011    Clinical Impression Statement  Per pt request, pt wishes to d/c from OT at this time and work on L hand at home via [place].  Pt understands that she has used 9 Medicaid visits for the year. Pt understands that she will need a new OT eval and tx order to return and that Medicaid would need to approve any additional visits at that time.     Occupational Profile and client history currently impacting functional performance  Rt cerebellar stroke 08/15/17, chronic migraines    Occupational performance deficits (Please refer to evaluation for details):  IADL's;Education;Work;Social Participation;Leisure;Other;ADL's    Rehab Potential  Good    OT Frequency  2x / week    OT Duration  6 weeks    OT Treatment/Interventions  Self-care/ADL training;Moist Heat;Fluidtherapy;DME and/or AE instruction;Therapeutic activities;Contrast Bath;Therapeutic exercise;Cognitive remediation/compensation;Coping strategies training;Neuromuscular education;Passive range of motion;Paraffin;Manual Therapy;Patient/family education    Plan  d/c from OT per pt request    Consulted and Agree with Plan of Care  Patient       Patient will benefit from skilled therapeutic intervention in order to improve the following deficits and impairments:     Visit Diagnosis: Other lack of coordination  Muscle weakness (generalized)  Other symptoms and signs involving cognitive functions following cerebral infarction  Other disturbances of skin sensation    Problem List Patient Active Problem List   Diagnosis Date Noted  . Cerebrovascular accident ([facility]) 11/02/2017  . Cerebrovascular accident (CVA) due to thrombosis of anterior cerebral artery ([facility])   . History of TTP (thrombotic thrombocytopenic purpura)   . Cerebral  embolism with cerebral infarction 08/17/2017  . Numbness on left side 08/16/2017  . Acute left lumbar radiculopathy 10/13/2016  . Chronic migraine 06/01/2016  . Pseudotumor cerebri 11/03/2015  . DENTAL PAIN 06/09/2010  . HEADACHE, TENSION 02/04/2010  . SCABIES 01/13/2010  . FOOT PAIN, LEFT 01/13/2010  . CERUMEN IMPACTION, BILATERAL 11/24/2009  . PSORIASIS 11/24/2009  . Smoker 10/15/2009  . 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 PYELONEPHRITIS 10/01/2009  . THROMBOTIC THROMBOCYTOPENIC PURPURA 12/13/2005  OCCUPATIONAL THERAPY DISCHARGE SUMMARY  Visits from Start of Care: 9  Current functional level related to goals / functional outcomes: See above   Remaining deficits: Decreased strength, sensation and functional use of L hand   Education / Equipment: HEP, putty  Plan: Patient agrees to discharge.  Patient goals were partially met. Patient is being discharged due to the patient's request.  ?????       Quay Burow, OTR/L 08/01/2018, 1:13 PM  [facility] [address] [place], [place], [address] Phone: [phone]   Fax:  [phone]  Name: Krista Maddox  MRN: [id number] Date of Birth: 06/11/1986

## 2018-08-15 ENCOUNTER — Other Ambulatory Visit: Payer: Self-pay | Admitting: Family Medicine

## 2018-08-15 ENCOUNTER — Telehealth: Payer: Self-pay

## 2018-08-15 NOTE — Telephone Encounter (Signed)
Spoke with the case worker Ms. Cofield and she states that she just needs a letter stating if the patient is unable to work and what date it started. We can fax the letter to [phone].

## 2018-08-15 NOTE — Progress Notes (Signed)
Letter for medical excuse written today. Patient is aware.

## 2018-08-29 NOTE — Telephone Encounter (Signed)
[id number] (11/29/18). DW

## 2018-08-31 NOTE — Telephone Encounter (Signed)
I called the patient and made her aware that she needed to call and give consent. Their phone number is [phone]. DW

## 2018-09-01 ENCOUNTER — Ambulatory Visit: Payer: Medicaid Other | Admitting: Family Medicine

## 2018-09-04 ENCOUNTER — Ambulatory Visit: Payer: Medicaid Other | Admitting: Neurology

## 2018-09-04 ENCOUNTER — Encounter: Payer: Self-pay | Admitting: Neurology

## 2018-09-04 VITALS — BP 136/84 | HR 96 | Ht 65.0 in | Wt 215.2 lb

## 2018-09-04 DIAGNOSIS — G43709 Chronic migraine without aura, not intractable, without status migrainosus: Secondary | ICD-10-CM | POA: Diagnosis not present

## 2018-09-04 DIAGNOSIS — IMO0002 Reserved for concepts with insufficient information to code with codable children: Secondary | ICD-10-CM

## 2018-09-04 NOTE — Progress Notes (Signed)
**  Botox 100 units x 2 vials, NDC 0023-1145-01, Lot [id number], Exp 09/2020, specialty pharmacy.//mck,rn** 

## 2018-09-04 NOTE — Progress Notes (Addendum)
[facility]  PATIENT: Krista Maddox DOB: 03-Feb-1986   HISTORY OF PRESENT ILLNESS:YYHeather L Onstad is a 32 years old right-handed female, seen in consultation by his ophthalmologist Dr. Alois Cliche for evaluation of bilateral papillary edema. Initial evaluation was on November 03 2015   I reviewed notes from Dr. Idolina Primer in May 28 2015, patient was seen for complains of blurry vision, especially seen distance, on examination, visual acuity right eye 20/25 minus, left 20/30 plus, bilateral papillary edema  She denied previous history of headache, since May 2016, she began to notice frequent headaches, bilateral retro-orbital area constant pressure, 8 out of 10 on daily basis, sometimes it would exacerbate to a more severe 10 out of 10 pounding headaches, with associated light noise sensitivity, nauseous, bending down or getting up quickly also causing her dizzines.  She has been taking up to 12 tablets of ibuprofen each day, which only temporarily helps her headaches,  She received etonogestrel (NEXPLANON) 68 MG IMPL implant in July 14th 2016. She complains of 25 pound weight gain since last year.  MRI brain In June 2016: Enlarged partially empty sella. Enlarged optic nerve sheaths. Findings are non-specific but can be seen in association with idiopathic intracranial hypertension.No acute findings. No masses or hydrocephalus.   She had a fluoroscopy guided lumbar puncture in June 25 2015, with open pressure 33 cm water, she has developed post LP headaches, eventually had blood patch in June 30 2015, with immediate relief of her headaches  Spinal fluid testing showed total protein 43, glucose 53, WBC 20, RBC 1, 72% lymphocytes, 24% monocytes  She continue has mild blurry vision, occasionally double vision on extreme gaze to the left or right, low-grade bifrontal pressure headaches, she has tried Maxalt as needed, which has been helpful for moderate and severe  headaches. She is tolerating Topamax 100 mg twice a day well  She began to have recurrent headaches since September 2016, 40 pound weight gain over one year,  she contributed to [facility] implant that  was put in 2015, had weight gain, ws taken out in Sep, 2016   repeat LP  in Sep 29 2015, showed Open pressure is 26 cm H2O. she did not get post LP headache this time, but she had needle site low back pain, mild left hip pain since the last lumbar puncture in October 17,  Lumbar puncture January 19 2016, open pressure was 23 cm water, unfortunately she developed post LP headache, failed to improve by Fioricet, eventually had blood patch January 22 2016, she was evaluated by neurosurgeon Dr. Kathyrn Sheriff for potential shunt replacement, is referred to [facility].  MRI scan lumbar spine  10/20/16 showing prominent disc degenerative change at L5-S1 with broad-based disc osteophyte protrusion resulting in severe bilateral foraminal narrowing and likely encroachment on exiting nerve roots.   Update October 13 2017: I reviewed discharge summary on September 4 to 07-2017, she presented to the emergency room complains of acute onset left-sided numbness with associated severe migraine, MRI of the brain showed acute infarction in the right cerebellum, several small acute infarction in the right posterior frontal region, echocardiogram showed ejection fraction 55-60%, normal wall motion, CT angiogram showed patent artery, LDL 89, A1c 4.8, transcranial Doppler study which review PFO, lower extremity venous Doppler study was normal, she was started on Lipitor, and aspirin 81 mg,  TEE on August 22 2017, trivial MR, no thrombosis or mass,  CT angiogram of head and neck no significant abnormalities.  She presented  to the emergency room October 11 2017 for acute  onset right-sided headache, with residual numbness of left face and hand from her previous stroke on August 15 2017, she does not feel good, left leg  feel weak, might gave out on her.   Extensive laboratory evaluation in September 2018, mild elevated ESR 28, negative ANA, cardiolipin antibody, lupus anticoagulant, UDS, Sjogren antibody, anti-DNA antibody, ANCA, rheumatoid factor, C-reactive protein was slightly elevated 8.7  Repeat MRI of the brain October 11 2017, showed no acute abnormality, previous signal abnormality in September 2018 had resolved,  She has has daily headache all over, 9/10, now taking Fioricet twice a day 2 weeks, blurry vision  She is still taking topamax '150mg'$  bid, gabapentin 300/'600mg'$ , nortriptyline '50mg'$  qhs.   UPDATE Nov 01 2017: She has been having daily headaches since September 2018, even before that, she was having more than 15 days in a month, each headache lasts more than 4 hours, moderate to severe,  Over the years, she has tried and failed Topamax, nortriptyline, propanolol, gabapentin, Effexor.   She is now taking Cambia as needed with moderate help, She complains of mildly blurry vision.  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  Update March 09, 2018: She received one Botox injection in November 2018, reported mild improvement last for 1 month, now she is having 3-4 migraine headaches each week, cambia helps,  30-day cardiac monitoring showed no significant abnormality in December 2018 Evaluation by ophthalmologist Dr. Carolynn Sayers in February 2019 was normal.  UPDATE Sept 24 2019: She continue complains of almost daily headaches, mild to moderate, but often exacerbated to a more severe headaches, she takes Ambien as needed, she denies significant visual change, recent ophthalmology evaluation in May by Dr. Katy Fitch reported normal, most recent lumbar puncture was in February 2017 open pressure was 23 cm water  Today is her first Botox injection as migraine prevention, she is also on Topamax 100 mg twice a day, Effexor XR 75 mg daily, nortriptyline 50 mg every night gabapentin as migraine  prevention   REVIEW OF SYSTEMS: Full 14 system review of systems performed and notable only for those listed, all others are neg:     ALLERGIES: Allergies  Allergen Reactions  . Tape Rash    Cannot tolerate paper tape    HOME MEDICATIONS: Outpatient Medications Prior to Visit  Medication Sig Dispense Refill  . acetaminophen (TYLENOL) 325 MG tablet Take 650 mg by mouth every 6 (six) hours as needed.    Marland Kitchen aspirin EC 325 MG tablet Take 1 tablet (325 mg total) by mouth daily. 30 tablet 0  . atorvastatin (LIPITOR) 40 MG tablet Take 1 tablet (40 mg total) by mouth daily. 90 tablet 3  . botulinum toxin Type A (BOTOX) 100 units SOLR injection Inject 200 Units into the muscle every 3 (three) months.    . co-enzyme Q-10 50 MG capsule Take 50 mg by mouth 2 (two) times daily.    . Diclofenac Potassium (CAMBIA) 50 MG PACK Take 50 mg by mouth daily as needed. 20 each 11  . gabapentin (NEURONTIN) 300 MG capsule TAKE 1 CAPSULE BY MOUTH TWICE A DAY MAY INCREASE TO 2 CAPS TWICE DAILY IF NEEDED 90 capsule 4  . Magnesium Oxide 400 (240 Mg) MG TABS Take 400 mg by mouth 2 (two) times daily. 60 tablet 11  . meloxicam (MOBIC) 15 MG tablet TAKE ONE TABLET DAILY AS NEEDED WITH FOOD 90 tablet 0  . nortriptyline (PAMELOR) 50 MG capsule TAKE  1 CAPSULE BY MOUTH EVERY DAY AT BEDTIME 90 capsule 4  . ondansetron (ZOFRAN ODT) 4 MG disintegrating tablet Take 1 tablet (4 mg total) by mouth every 8 (eight) hours as needed for nausea or vomiting. 30 tablet 11  . Riboflavin 100 MG TABS Take 1 tablet (100 mg total) by mouth 2 (two) times daily. 60 tablet 11  . tiZANidine (ZANAFLEX) 4 MG tablet Take 1 tablet (4 mg total) by mouth every 6 (six) hours as needed for muscle spasms. 30 tablet 0  . topiramate (TOPAMAX) 100 MG tablet Take 1.5 tablets (150 mg total) by mouth 2 (two) times daily. TAKE '150MG'$  BY MOUTH TWICE DAILY 270 tablet 4  . venlafaxine XR (EFFEXOR-XR) 75 MG 24 hr capsule Take 1 capsule (75 mg total) by mouth daily  with breakfast. 30 capsule 11   No facility-administered medications prior to visit.     PAST MEDICAL HISTORY: Past Medical History:  Diagnosis Date  . DDD (degenerative disc disease), lumbar   . Headache   . Other disorders of optic nerve, not elsewhere classified, unspecified eye   . Stroke ([place])   . TTP (thrombotic thrombocytopenic purpura) ([place])     PAST SURGICAL HISTORY: Past Surgical History:  Procedure Laterality Date  . CESAREAN SECTION    . CHOLECYSTECTOMY    . TEE WITHOUT CARDIOVERSION N/A 08/22/2017   Procedure: TRANSESOPHAGEAL ECHOCARDIOGRAM (TEE);  Surgeon: Skeet Latch, MD;  [facility];  Service: Cardiovascular;  Laterality: N/A;  . WISDOM TOOTH EXTRACTION      FAMILY HISTORY: Family History  Problem Relation Age of Onset  . Diabetes Mother   . Hypertension Mother   . Ovarian cancer Mother        Hysterectomy  . Healthy Father   . Ovarian cancer Maternal Grandmother        Hysterectomy    SOCIAL HISTORY: Social History   Socioeconomic History  . Marital status: Single    Spouse name: Not on file  . Number of children: 1  . Years of education: In college  . Highest education level: Not on file  Occupational History  . Occupation: Counsellor  Social Needs  . Financial resource strain: Not on file  . Food insecurity:    Worry: Not on file    Inability: Not on file  . Transportation needs:    Medical: Not on file    Non-medical: Not on file  Tobacco Use  . Smoking status: Former Smoker    Packs/day: 0.50    Last attempt to quit: 08/27/2017    Years since quitting: 1.0  . Smokeless tobacco: Never Used  Substance and Sexual Activity  . Alcohol use: No    Alcohol/week: 0.0 standard drinks  . Drug use: No  . Sexual activity: Yes    Birth control/protection: Pill  Lifestyle  . Physical activity:    Days per week: Not on file    Minutes per session: Not on file  . Stress: Not on file  Relationships  . Social connections:     Talks on phone: Not on file    Gets together: Not on file    Attends religious service: Not on file    Active member of club or organization: Not on file    Attends meetings of clubs or organizations: Not on file    Relationship status: Not on file  . Intimate partner violence:    Fear of current or ex partner: Not on file    Emotionally abused: Not  on file    Physically abused: Not on file    Forced sexual activity: Not on file  Other Topics Concern  . Not on file  Social History Narrative   Lives at home with her mother and daughter.   3-4 cups caffeine daily.   Left-handed.     PHYSICAL EXAM  Vitals:   09/04/18 1107  BP: 136/84  Pulse: 96  Weight: 215 lb 4 oz (97.6 kg)  Height: '5\' 5"'$  (1.651 m)   Body mass index is 35.82 kg/m.    PHYSICAL EXAMNIATION:  Gen: NAD, conversant, well nourised, obese, well groomed                     Cardiovascular: Regular rate rhythm, no peripheral edema, warm, nontender. Eyes: Conjunctivae clear without exudates or hemorrhage Neck: Supple, no carotid bruits. Pulmonary: Clear to auscultation bilaterally   NEUROLOGICAL EXAM:  MENTAL STATUS: Speech:    Speech is normal; fluent and spontaneous with normal comprehension.  Cognition:     Orientation to time, place and person     Normal recent and remote memory     Normal Attention span and concentration     Normal Language, naming, repeating,spontaneous speech     Fund of knowledge   CRANIAL NERVES: CN II: Visual fields are full to confrontation. Fundoscopic exam is normal with sharp discs and no vascular changes. Pupils are round equal and briskly reactive to light. CN III, IV, VI: extraocular movement are normal. No ptosis. CN V: Facial sensation is intact to pinprick in all 3 divisions bilaterally. Corneal responses are intact.  CN VII: mild left lower face weakness. CN VIII: Hearing is normal to rubbing fingers CN IX, X: Palate elevates symmetrically. Phonation is normal. CN  XI: Head turning and shoulder shrug are intact CN XII: Tongue is midline with normal movements and no atrophy.  MOTOR: She has mild left arm and leg weakness  REFLEXES: Reflexes are 2+ and symmetric at the biceps, triceps, knees, and ankles. Plantar responses are flexor.  SENSORY: Intact to light touch, pinprick, positional and vibratory sensation are intact in fingers and toes.  COORDINATION: Rapid alternating movements and fine finger movements are intact. There is no dysmetria on finger-to-nose and heel-knee-shin.    GAIT/STANCE: Mildly unsteady, dragging her left leg Romberg is absent.    DIAGNOSTIC DATA (LABS, IMAGING, TESTING)  ASSESSMENT AND PLAN  32 y.o. 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 Carolynn Sayers in February 2019 showed no evidence of papillary edema  Chronic migraine headaches,  Now daily headaches, more than 15 days headache in 6 months, more than 4 hours, moderate to severe,  Over the years, she has tried and failed Topamax, propranolol, nortriptyline, Effexor, gabapentin,  Continue Topamax 150 mg twice a day, higher dose of Effexor 75 mg every day, nortriptyline 50 mg daily  Magnesium oxide 400 mg twice a day, riboflavin 100 mg twice a day as migraine prevention, gabapentin  Cambia, zofran, tizanidine as needed.  BOTOX injection was performed according to protocol by [employer]. 100 units of BOTOX was dissolved into 2 cc NS.            Extra 25 Units were injected into bilateral parietal and temporal region, 10  units of BOTOX to each masseter muscle.   Patient tolerate the injection well. Will return for repeat injection in 3 months.  Marcial Pacas, M.D.  Ph.D.  [facility] [place], [place] [address] Phone: [phone] Fax:      [phone]  Addendum about her stroke work up with vascular neurologist Dr. 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 TEE was negative and she had h/o TTP hence not a good long term anticoagulation canadidate and given young age and absence of vascular risk factore low probability of finding AFIB. We typically do not do loop in pts below 54 years of age unless there are several vascular risk factors identified. She also had history of birth control pills and migraine which may have contributed to hypercoagulability. So I feel she is cryptogenic stroke in young with negative evaluation.

## 2018-09-12 ENCOUNTER — Ambulatory Visit: Payer: Medicaid Other | Admitting: Nurse Practitioner

## 2018-09-12 DIAGNOSIS — Z0271 Encounter for disability determination: Secondary | ICD-10-CM

## 2018-10-02 ENCOUNTER — Other Ambulatory Visit: Payer: Self-pay | Admitting: Internal Medicine

## 2018-10-09 ENCOUNTER — Telehealth: Payer: Self-pay | Admitting: Neurology

## 2018-10-09 NOTE — Telephone Encounter (Signed)
Pt called stating she is on day 4 of a migraine, stating non of her medication have helped. Pt unable to find relief for the pain. Requesting a call to discuss

## 2018-10-09 NOTE — Telephone Encounter (Signed)
Spoke to [employer] - reports migraine x 4 days with no relief with home medications.  She would like to come in for an infusion, if possible.  She is allergic to paper tape.  Ok, per vo by Dr. Terrace Arabia, to come in for the following medications to be administered by IV:  1) VPA 1gram 2) Toradol 30mg   She will arrive to our office at 9am.  Signed orders provider to [facility].

## 2018-10-26 ENCOUNTER — Telehealth: Payer: Self-pay | Admitting: Neurology

## 2018-10-26 NOTE — Telephone Encounter (Signed)
Botox letter regarding [facility] mailed to patient °

## 2018-11-01 ENCOUNTER — Other Ambulatory Visit: Payer: Self-pay | Admitting: Internal Medicine

## 2018-11-01 ENCOUNTER — Other Ambulatory Visit: Payer: Self-pay | Admitting: Neurology

## 2018-11-02 ENCOUNTER — Other Ambulatory Visit: Payer: Self-pay | Admitting: Adult Health

## 2018-11-02 MED ORDER — GABAPENTIN 300 MG PO CAPS: ORAL_CAPSULE | ORAL | 11 refills | Status: DC

## 2018-11-02 NOTE — Telephone Encounter (Signed)
RN call patient about needing refills on gabapentin. RN stated it was last prescribed in April 2019 by her PCP.Pt stated she has change PCP and will have a new one in the coming months. Rn stated message will be sent to Dr.Yan for review if she will refill.

## 2018-11-02 NOTE — Telephone Encounter (Signed)
Pt calling for a refill on her gabapentin (NEURONTIN) 300 MG capsule [id number] - [place], [place] - [address]

## 2018-11-02 NOTE — Addendum Note (Signed)
Addended by: Levert FeinsteinYAN, Samay Delcarlo on: 11/02/2018 11:01 AM   Modules accepted: Orders

## 2018-11-02 NOTE — Telephone Encounter (Signed)
Rn call pt that [facility]

## 2018-11-02 NOTE — Telephone Encounter (Signed)
I refilled her gabapentin 300 mg, 120 tablets each month with 11 refills,

## 2018-11-14 ENCOUNTER — Telehealth: Payer: Self-pay | Admitting: *Deleted

## 2018-11-14 NOTE — Telephone Encounter (Signed)
Copied from CRM [phone]. Topic: Appointment Scheduling - Scheduling Inquiry for Clinic >> Nov 14, 2018  3:14 PM Crist InfanteHarrald, Kathy J wrote: Reason for CRM: pt states her mom, Derwood KaplanSara Covell asked Dr Clent RidgesFry if he was taking new pts, and he said he would accept this pt, her daughter. Please advise if ok to schedule   Dr. Clent RidgesFry please advise on taking this person on as a new pt.  Thanks.

## 2018-11-15 NOTE — Telephone Encounter (Signed)
I did agree to this, but I [place]. This means I cannot see her

## 2018-11-15 NOTE — Telephone Encounter (Signed)
I have called and lmomtcb x 1 for the pt.    Ok to let her know that he is not able to see her due to her having [place].

## 2018-11-29 ENCOUNTER — Encounter: Payer: Self-pay | Admitting: Neurology

## 2018-11-29 ENCOUNTER — Ambulatory Visit: Payer: Medicaid Other | Admitting: Neurology

## 2018-11-29 VITALS — BP 138/90 | HR 106 | Ht 65.0 in | Wt 217.0 lb

## 2018-11-29 DIAGNOSIS — G43709 Chronic migraine without aura, not intractable, without status migrainosus: Secondary | ICD-10-CM | POA: Diagnosis not present

## 2018-11-29 DIAGNOSIS — IMO0002 Reserved for concepts with insufficient information to code with codable children: Secondary | ICD-10-CM

## 2018-11-29 MED ORDER — ERENUMAB-AOOE 70 MG/ML ~~LOC~~ SOAJ: 70.0000 mg | SUBCUTANEOUS | 11 refills | Status: DC

## 2018-11-29 NOTE — Progress Notes (Signed)
[facility]  PATIENT: Krista Maddox DOB: 03-Feb-1986   HISTORY OF PRESENT ILLNESS:Krista Maddox is a 32 years old right-handed female, seen in consultation by his ophthalmologist Dr. Alois Cliche for evaluation of bilateral papillary edema. Initial evaluation was on November 03 2015   I reviewed notes from Dr. Idolina Primer in May 28 2015, patient was seen for complains of blurry vision, especially seen distance, on examination, visual acuity right eye 20/25 minus, left 20/30 plus, bilateral papillary edema  She denied previous history of headache, since May 2016, she began to notice frequent headaches, bilateral retro-orbital area constant pressure, 8 out of 10 on daily basis, sometimes it would exacerbate to a more severe 10 out of 10 pounding headaches, with associated light noise sensitivity, nauseous, bending down or getting up quickly also causing her dizzines.  She has been taking up to 12 tablets of ibuprofen each day, which only temporarily helps her headaches,  She received etonogestrel (NEXPLANON) 68 MG IMPL implant in July 14th 2016. She complains of 25 pound weight gain since last year.  MRI brain In June 2016: Enlarged partially empty sella. Enlarged optic nerve sheaths. Findings are non-specific but can be seen in association with idiopathic intracranial hypertension.No acute findings. No masses or hydrocephalus.   She had a fluoroscopy guided lumbar puncture in June 25 2015, with open pressure 33 cm water, she has developed post LP headaches, eventually had blood patch in June 30 2015, with immediate relief of her headaches  Spinal fluid testing showed total protein 43, glucose 53, WBC 20, RBC 1, 72% lymphocytes, 24% monocytes  She continue has mild blurry vision, occasionally double vision on extreme gaze to the left or right, low-grade bifrontal pressure headaches, she has tried Maxalt as needed, which has been helpful for moderate and severe  headaches. She is tolerating Topamax 100 mg twice a day well  She began to have recurrent headaches since September 2016, 40 pound weight gain over one year,  she contributed to [facility] implant that  was put in 2015, had weight gain, ws taken out in Sep, 2016   repeat LP  in Sep 29 2015, showed Open pressure is 26 cm H2O. she did not get post LP headache this time, but she had needle site low back pain, mild left hip pain since the last lumbar puncture in October 17,  Lumbar puncture January 19 2016, open pressure was 23 cm water, unfortunately she developed post LP headache, failed to improve by Fioricet, eventually had blood patch January 22 2016, she was evaluated by neurosurgeon Dr. Kathyrn Sheriff for potential shunt replacement, is referred to [facility].  MRI scan lumbar spine  10/20/16 showing prominent disc degenerative change at L5-S1 with broad-based disc osteophyte protrusion resulting in severe bilateral foraminal narrowing and likely encroachment on exiting nerve roots.   Update October 13 2017: I reviewed discharge summary on September 4 to 07-2017, she presented to the emergency room complains of acute onset left-sided numbness with associated severe migraine, MRI of the brain showed acute infarction in the right cerebellum, several small acute infarction in the right posterior frontal region, echocardiogram showed ejection fraction 55-60%, normal wall motion, CT angiogram showed patent artery, LDL 89, A1c 4.8, transcranial Doppler study which review PFO, lower extremity venous Doppler study was normal, she was started on Lipitor, and aspirin 81 mg,  TEE on August 22 2017, trivial MR, no thrombosis or mass,  CT angiogram of head and neck no significant abnormalities.  She presented  to the emergency room October 11 2017 for acute  onset right-sided headache, with residual numbness of left face and hand from her previous stroke on August 15 2017, she does not feel good, left leg  feel weak, might gave out on her.   Extensive laboratory evaluation in September 2018, mild elevated ESR 28, negative ANA, cardiolipin antibody, lupus anticoagulant, UDS, Sjogren antibody, anti-DNA antibody, ANCA, rheumatoid factor, C-reactive protein was slightly elevated 8.7  Repeat MRI of the brain October 11 2017, showed no acute abnormality, previous signal abnormality in September 2018 had resolved,  She has has daily headache all over, 9/10, now taking Fioricet twice a day 2 weeks, blurry vision  She is still taking topamax '150mg'$  bid, gabapentin 300/'600mg'$ , nortriptyline '50mg'$  qhs.   UPDATE Nov 01 2017: She has been having daily headaches since September 2018, even before that, she was having more than 15 days in a month, each headache lasts more than 4 hours, moderate to severe,  Over the years, she has tried and failed Topamax, nortriptyline, propanolol, gabapentin, Effexor.   She is now taking Cambia as needed with moderate help, She complains of mildly blurry vision.  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  Update March 09, 2018: She received one Botox injection in November 2018, reported mild improvement last for 1 month, now she is having 3-4 migraine headaches each week, cambia helps,  30-day cardiac monitoring showed no significant abnormality in December 2018 Evaluation by ophthalmologist Dr. Carolynn Sayers in February 2019 was normal.  UPDATE Sept 24 2019: She continue complains of almost daily headaches, mild to moderate, but often exacerbated to a more severe headaches, she takes Ambien as needed, she denies significant visual change, recent ophthalmology evaluation in May by Dr. Katy Fitch reported normal, most recent lumbar puncture was in February 2017 open pressure was 23 cm water  Resume BOTOX injection as migraine prevention on Sept 24 2019,, she is also on Topamax 100 mg twice a day, Effexor XR 75 mg daily, nortriptyline 50 mg every night gabapentin as migraine  prevention  UPDATE Nov 29 2018: Addendum about her stroke work up with vascular neurologist Dr. 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 TEE was negative and she had h/o TTP hence not a good long term anticoagulation canadidate and given young age and absence of vascular risk factore low probability of finding AFIB. We typically do not do loop in pts below 35 years of age unless there are several vascular risk factors identified. She also had history of birth control pills and migraine which may have contributed to hypercoagulability. So I feel she is cryptogenic stroke in young with negative evaluation.   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 Asymmetry of the transverse sinus systems is a normal variant.  She only report mild improvement from previous Botox injection 3 months ago, continue have 4 headaches each week, respond somewhat to [place]: Full 14 system review of systems performed and notable only for those listed, all others are neg:    ALLERGIES: Allergies  Allergen Reactions  . Tape Rash    Cannot tolerate paper tape    HOME MEDICATIONS: Outpatient Medications Prior to Visit  Medication Sig Dispense Refill  . acetaminophen (TYLENOL) 325 MG tablet Take 650 mg by mouth every 6 (six) hours as needed.    Marland Kitchen aspirin  EC 325 MG tablet Take 1 tablet (325 mg total) by mouth daily. 30 tablet 0  . atorvastatin (LIPITOR) 40 MG tablet Take 1 tablet (40 mg total) by mouth daily. 90 tablet 3  . botulinum toxin Type A (BOTOX) 100 units SOLR injection Inject 200 Units into the muscle every 3 (three) months.    . co-enzyme Q-10 50 MG capsule Take 50 mg by mouth 2 (two) times daily.    . Diclofenac Potassium (CAMBIA) 50 MG PACK Take 50 mg by mouth daily as needed. 20 each 11  . gabapentin (NEURONTIN) 300 MG capsule TAKE 1  CAPSULE BY MOUTH TWICE A DAY MAY INCREASE TO 2 CAPS TWICE DAILY IF NEEDED 120 capsule 11  . Magnesium Oxide 400 (240 Mg) MG TABS Take 400 mg by mouth 2 (two) times daily. 60 tablet 11  . meloxicam (MOBIC) 15 MG tablet TAKE ONE TABLET DAILY AS NEEDED WITH FOOD 90 tablet 0  . nortriptyline (PAMELOR) 50 MG capsule TAKE 1 CAPSULE BY MOUTH EVERY DAY AT BEDTIME 90 capsule 4  . ondansetron (ZOFRAN-ODT) 4 MG disintegrating tablet TAKE 1 TABLET BY MOUTH EVERY 8 HOURS AS NEEDED FOR NAUSEA AND VOMITING 30 tablet 11  . Riboflavin 100 MG TABS Take 1 tablet (100 mg total) by mouth 2 (two) times daily. 60 tablet 11  . tiZANidine (ZANAFLEX) 4 MG tablet TAKE 1 TABLET BY MOUTH EVERY 6 HOURS AS NEEDED FOR MUSCLE SPASMS. 30 tablet 11  . topiramate (TOPAMAX) 100 MG tablet Take 1.5 tablets (150 mg total) by mouth 2 (two) times daily. TAKE 150MG BY MOUTH TWICE DAILY 270 tablet 4  . venlafaxine XR (EFFEXOR-XR) 75 MG 24 hr capsule Take 1 capsule (75 mg total) by mouth daily with breakfast. 30 capsule 11   No facility-administered medications prior to visit.     PAST MEDICAL HISTORY: Past Medical History:  Diagnosis Date  . DDD (degenerative disc disease), lumbar   . Headache   . Other disorders of optic nerve, not elsewhere classified, unspecified eye   . Stroke ([place])   . TTP (thrombotic thrombocytopenic purpura) ([place])     PAST SURGICAL HISTORY: Past Surgical History:  Procedure Laterality Date  . CESAREAN SECTION    . CHOLECYSTECTOMY    . TEE WITHOUT CARDIOVERSION N/A 08/22/2017   Procedure: TRANSESOPHAGEAL ECHOCARDIOGRAM (TEE);  Surgeon: Skeet Latch, MD;  Location[facility] ENDOSCOPY;  Service: Cardiovascular;  Laterality: N/A;  . WISDOM TOOTH EXTRACTION      FAMILY HISTORY: Family History  Problem Relation Age of Onset  . Diabetes Mother   . Hypertension Mother   . Ovarian cancer Mother        Hysterectomy  . Healthy Father   . Ovarian cancer Maternal Grandmother        Hysterectomy    SOCIAL  HISTORY: Social History   Socioeconomic History  . Marital status: Single    Spouse name: Not on file  . Number of children: 1  . Years of education: In college  . Highest education level: Not on file  Occupational History  . Occupation: Counsellor  Social Needs  . Financial resource strain: Not on file  . Food insecurity:    Worry: Not on file    Inability: Not on file  . Transportation needs:    Medical: Not on file    Non-medical: Not on file  Tobacco Use  . Smoking status: Former Smoker    Packs/day: 0.50    Last attempt to quit: 08/27/2017  Years since quitting: 1.2  . Smokeless tobacco: Never Used  Substance and Sexual Activity  . Alcohol use: No    Alcohol/week: 0.0 standard drinks  . Drug use: No  . Sexual activity: Yes    Birth control/protection: Pill  Lifestyle  . Physical activity:    Days per week: Not on file    Minutes per session: Not on file  . Stress: Not on file  Relationships  . Social connections:    Talks on phone: Not on file    Gets together: Not on file    Attends religious service: Not on file    Active member of club or organization: Not on file    Attends meetings of clubs or organizations: Not on file    Relationship status: Not on file  . Intimate partner violence:    Fear of current or ex partner: Not on file    Emotionally abused: Not on file    Physically abused: Not on file    Forced sexual activity: Not on file  Other Topics Concern  . Not on file  Social History Narrative   Lives at home with her mother and daughter.   3-4 cups caffeine daily.   Left-handed.     PHYSICAL EXAM  Vitals:   11/29/18 1021  BP: 138/90  Pulse: (!) 106  Weight: 217 lb (98.4 kg)  Height: '5\' 5"'$  (1.651 m)   Body mass index is 36.11 kg/m.    PHYSICAL EXAMNIATION:  Gen: NAD, conversant, well nourised, obese, well groomed                     Cardiovascular: Regular rate rhythm, no peripheral edema, warm, nontender. Eyes: Conjunctivae  clear without exudates or hemorrhage Neck: Supple, no carotid bruits. Pulmonary: Clear to auscultation bilaterally   NEUROLOGICAL EXAM:  MENTAL STATUS: Speech:    Speech is normal; fluent and spontaneous with normal comprehension.  Cognition:     Orientation to time, place and person     Normal recent and remote memory     Normal Attention span and concentration     Normal Language, naming, repeating,spontaneous speech     Fund of knowledge   CRANIAL NERVES: CN II: Visual fields are full to confrontation.Pupils are round equal and briskly reactive to light. CN III, IV, VI: extraocular movement are normal. No ptosis. CN V: Facial sensation is intact to pinprick in all 3 divisions bilaterally. Corneal responses are intact.  CN VII: mild left lower face weakness. CN VIII: Hearing is normal to rubbing fingers CN IX, X: Palate elevates symmetrically. Phonation is normal. CN XI: Head turning and shoulder shrug are intact CN XII: Tongue is midline with normal movements and no atrophy.  MOTOR: She has mild left arm and leg weakness  REFLEXES: Reflexes are 2+ and symmetric at the biceps, triceps, knees, and ankles. Plantar responses are flexor.  SENSORY: Intact to light touch, pinprick, positional and vibratory sensation are intact in fingers and toes.  COORDINATION: Rapid alternating movements and fine finger movements are intact. There is no dysmetria on finger-to-nose and heel-knee-shin.    GAIT/STANCE: Mildly unsteady, dragging her left leg Romberg is absent.    DIAGNOSTIC DATA (LABS, IMAGING, TESTING)  ASSESSMENT AND PLAN  32 y.o. 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 Carolynn Sayers in  February 2019 showed no evidence of papillary edema  Chronic migraine headaches,   Over the years, she has tried and failed Topamax, propranolol, nortriptyline, Effexor, gabapentin,  Continue Topamax 150 mg twice a day, higher dose of Effexor 75 mg every day, nortriptyline 50 mg daily  Magnesium oxide 400 mg twice a day, riboflavin 100 mg twice a day as migraine prevention, gabapentin  Cambia, zofran, tizanidine as needed.  BOTOX injection was performed according to protocol by [employer]. 100 units of BOTOX was dissolved into 2 cc NS.            Extra 25 Units were injected into bilateral parietal and temporal region, 10 units of BOTOX to each masseter muscle.   Patient tolerate the injection well. Will return for repeat injection in 3 months  Add on Aimovig 70 mg SQ.  Marcial Pacas, M.D. Ph.D.  [facility] [place], [place] [address] Phone: [phone] Fax:      [phone]

## 2018-11-29 NOTE — Progress Notes (Signed)
**  Botox 100 units x 2 vials, NDC 0023-1145-01, Lot [id number], Exp 04/2021, specialty pharmacy.//mck,rn** 

## 2019-01-30 ENCOUNTER — Other Ambulatory Visit: Payer: Self-pay | Admitting: Neurology

## 2019-02-16 ENCOUNTER — Telehealth: Payer: Self-pay | Admitting: Adult Health

## 2019-02-16 NOTE — Telephone Encounter (Signed)
Patient was seen by her primary care physician Dr. Livingston Diones yesterday, complains of episode of recurrent transient word finding difficulties, right side numbness, weakness,  I reviewed MRI of the brain February 2019 that was normal  Appointment pending for February 28, 2019,  I ordered EEG to rule out possibility of seizure, please call patient to inform her above decision, make sure she is back to her baseline.

## 2019-02-16 NOTE — Telephone Encounter (Signed)
PCP of pt  Dr. Livingston Diones has called asking to speak with Dr Terrace Arabia re: pt, she is asking to be called at [phone], she will be in the office until 5pm and the office closes from 12-1 for lunch

## 2019-02-16 NOTE — Telephone Encounter (Signed)
Her symptoms of transient word finding difficulties, right side numbness and weakness have improved.  She is agreeable to the EEG and aware to expect a call for scheduling.  She is fighting upper respiratory symptoms of congestion and cough.  She is resting at home and staying well hydrated.  Say she was negative for the flu.

## 2019-02-21 ENCOUNTER — Telehealth: Payer: Self-pay | Admitting: Neurology

## 2019-02-21 NOTE — Telephone Encounter (Signed)
I called [facility] and started PA request. [phone]

## 2019-02-27 ENCOUNTER — Telehealth: Payer: Self-pay | Admitting: Neurology

## 2019-02-27 NOTE — Telephone Encounter (Signed)
I called her, she still has a lots of headaches, will keep her appt for BOTOX injection for headaches.

## 2019-02-28 ENCOUNTER — Ambulatory Visit: Payer: Medicaid Other | Admitting: Neurology

## 2019-02-28 ENCOUNTER — Other Ambulatory Visit: Payer: Self-pay

## 2019-02-28 ENCOUNTER — Encounter: Payer: Self-pay | Admitting: Neurology

## 2019-02-28 ENCOUNTER — Telehealth: Payer: Self-pay | Admitting: Neurology

## 2019-02-28 VITALS — BP 137/87 | HR 103 | Ht 65.0 in | Wt 221.5 lb

## 2019-02-28 DIAGNOSIS — R202 Paresthesia of skin: Secondary | ICD-10-CM

## 2019-02-28 DIAGNOSIS — G43709 Chronic migraine without aura, not intractable, without status migrainosus: Secondary | ICD-10-CM | POA: Diagnosis not present

## 2019-02-28 DIAGNOSIS — IMO0002 Reserved for concepts with insufficient information to code with codable children: Secondary | ICD-10-CM

## 2019-02-28 DIAGNOSIS — R479 Unspecified speech disturbances: Secondary | ICD-10-CM

## 2019-02-28 DIAGNOSIS — I6339 Cerebral infarction due to thrombosis of other cerebral artery: Secondary | ICD-10-CM | POA: Diagnosis not present

## 2019-02-28 MED ORDER — DICLOFENAC POTASSIUM(MIGRAINE) 50 MG PO PACK: 50.0000 mg | PACK | Freq: Every day | ORAL | 11 refills | Status: DC | PRN

## 2019-02-28 NOTE — Progress Notes (Signed)
**  Botox 100 units x 2 vials, NDC 0023-1145-01, Lot [id number], Exp 07/2021, specialty pharmacy.//mck,rn** 

## 2019-02-28 NOTE — Progress Notes (Signed)
[facility]  PATIENT: OMOLARA CAROL DOB: 08/09/1986   HISTORY OF PRESENT ILLNESS:YYHeather L Guizar is a 33 years old right-handed female, seen in consultation by his ophthalmologist Dr. Alois Cliche for evaluation of bilateral papillary edema. Initial evaluation was on November 03 2015   I reviewed notes from Dr. Idolina Primer in May 28 2015, patient was seen for complains of blurry vision, especially seen distance, on examination, visual acuity right eye 20/25 minus, left 20/30 plus, bilateral papillary edema  She denied previous history of headache, since May 2016, she began to notice frequent headaches, bilateral retro-orbital area constant pressure, 8 out of 10 on daily basis, sometimes it would exacerbate to a more severe 10 out of 10 pounding headaches, with associated light noise sensitivity, nauseous, bending down or getting up quickly also causing her dizzines.  She has been taking up to 12 tablets of ibuprofen each day, which only temporarily helps her headaches,  She received etonogestrel (NEXPLANON) 68 MG IMPL implant in July 14th 2016. She complains of 25 pound weight gain since last year.  MRI brain In June 2016: Enlarged partially empty sella. Enlarged optic nerve sheaths. Findings are non-specific but can be seen in association with idiopathic intracranial hypertension.No acute findings. No masses or hydrocephalus.   She had a fluoroscopy guided lumbar puncture in June 25 2015, with open pressure 33 cm water, she has developed post LP headaches, eventually had blood patch in June 30 2015, with immediate relief of her headaches  Spinal fluid testing showed total protein 43, glucose 53, WBC 20, RBC 1, 72% lymphocytes, 24% monocytes  She continue has mild blurry vision, occasionally double vision on extreme gaze to the left or right, low-grade bifrontal pressure headaches, she has tried Maxalt as needed, which has been helpful for moderate and severe  headaches. She is tolerating Topamax 100 mg twice a day well  She began to have recurrent headaches since September 2016, 40 pound weight gain over one year,  she contributed to [facility] implant that  was put in 2015, had weight gain, ws taken out in Sep, 2016   repeat LP  in Sep 29 2015, showed Open pressure is 26 cm H2O. she did not get post LP headache this time, but she had needle site low back pain, mild left hip pain since the last lumbar puncture in October 17,  Lumbar puncture January 19 2016, open pressure was 23 cm water, unfortunately she developed post LP headache, failed to improve by Fioricet, eventually had blood patch January 22 2016, she was evaluated by neurosurgeon Dr. Kathyrn Sheriff for potential shunt replacement, is referred to [facility].  MRI scan lumbar spine  10/20/16 showing prominent disc degenerative change at L5-S1 with broad-based disc osteophyte protrusion resulting in severe bilateral foraminal narrowing and likely encroachment on exiting nerve roots.   Update October 13 2017: I reviewed discharge summary on September 4 to 07-2017, she presented to the emergency room complains of acute onset left-sided numbness with associated severe migraine, MRI of the brain showed acute infarction in the right cerebellum, several small acute infarction in the right posterior frontal region, echocardiogram showed ejection fraction 55-60%, normal wall motion, CT angiogram showed patent artery, LDL 89, A1c 4.8, transcranial Doppler study which review PFO, lower extremity venous Doppler study was normal, she was started on Lipitor, and aspirin 81 mg,  TEE on August 22 2017, trivial MR, no thrombosis or mass,  CT angiogram of head and neck no significant abnormalities.  She presented  to the emergency room October 11 2017 for acute  onset right-sided headache, with residual numbness of left face and hand from her previous stroke on August 15 2017, she does not feel good, left leg  feel weak, might gave out on her.   Extensive laboratory evaluation in September 2018, mild elevated ESR 28, negative ANA, cardiolipin antibody, lupus anticoagulant, UDS, Sjogren antibody, anti-DNA antibody, ANCA, rheumatoid factor, C-reactive protein was slightly elevated 8.7  Repeat MRI of the brain October 11 2017, showed no acute abnormality, previous signal abnormality in September 2018 had resolved,  She has has daily headache all over, 9/10, now taking Fioricet twice a day 2 weeks, blurry vision  She is still taking topamax 167m bid, gabapentin 300/6026m nortriptyline 5085mhs.   UPDATE Nov 01 2017: She has been having daily headaches since September 2018, even before that, she was having more than 15 days in a month, each headache lasts more than 4 hours, moderate to severe,  Over the years, she has tried and failed Topamax, nortriptyline, propanolol, gabapentin, Effexor.   She is now taking Cambia as needed with moderate help, She complains of mildly blurry vision.  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  Update March 09, 2018: She received one Botox injection in November 2018, reported mild improvement last for 1 month, now she is having 3-4 migraine headaches each week, cambia helps,  30-day cardiac monitoring showed no significant abnormality in December 2018 Evaluation by ophthalmologist Dr. GroCarolynn Sayers February 2019 was normal.  UPDATE Sept 24 2019: She continue complains of almost daily headaches, mild to moderate, but often exacerbated to a more severe headaches, she takes Ambien as needed, she denies significant visual change, recent ophthalmology evaluation in May by Dr. GroKaty Fitchported normal, most recent lumbar puncture was in February 2017 open pressure was 23 cm water  Resume BOTOX injection as migraine prevention on Sept 24 2019,, she is also on Topamax 100 mg twice a day, Effexor XR 75 mg daily, nortriptyline 50 mg every night gabapentin as migraine  prevention  UPDATE Nov 29 2018: Addendum about her stroke work up with vascular neurologist Dr. 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 TEE was negative and she had h/o TTP hence not a good long term anticoagulation canadidate and given young age and absence of vascular risk factore low probability of finding AFIB. We typically do not do loop in pts below 40 63ars of age unless there are several vascular risk factors identified. She also had history of birth control pills and migraine which may have contributed to hypercoagulability. So I feel she is cryptogenic stroke in young with negative evaluation.   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 Asymmetry of the transverse sinus systems is a normal variant.  She only report mild improvement from previous Botox injection 3 months ago, continue have 4 headaches each week, respond somewhat to [facility] 2020: She started aimovig since December 2019, she reported mild improvement along with Botox injection every 3 months, but she still has 3 headaches in a week, not taking any abortive treatment, previously Cam[place]villerked very well  She complains of few months history of intermittent sudden onset mild confusion, word finding difficulties, also transient right hand numbness, that was independent of her language difficulty  EEG was ordered is pending   REVIEW  OF SYSTEMS: Full 14 system review of systems performed and notable only for those listed, all others are neg:    ALLERGIES: Allergies  Allergen Reactions   Tape Rash    Cannot tolerate paper tape    HOME MEDICATIONS: Outpatient Medications Prior to Visit  Medication Sig Dispense Refill   acetaminophen (TYLENOL) 325 MG tablet Take 650 mg by mouth every 6 (six) hours as needed.     aspirin EC 325 MG tablet Take 1 tablet (325  mg total) by mouth daily. 30 tablet 0   atorvastatin (LIPITOR) 40 MG tablet Take 1 tablet (40 mg total) by mouth daily. 90 tablet 3   botulinum toxin Type A (BOTOX) 100 units SOLR injection Inject 200 Units into the muscle every 3 (three) months.     co-enzyme Q-10 50 MG capsule Take 50 mg by mouth 2 (two) times daily.     Diclofenac Potassium (CAMBIA) 50 MG PACK Take 50 mg by mouth daily as needed. 20 each 11   Erenumab-aooe (AIMOVIG) 70 MG/ML SOAJ Inject 70 mg into the skin every 30 (thirty) days. 1 pen 11   gabapentin (NEURONTIN) 300 MG capsule TAKE 1 CAPSULE BY MOUTH TWICE A DAY MAY INCREASE TO 2 CAPS TWICE DAILY IF NEEDED 120 capsule 11   Magnesium Oxide 400 (240 Mg) MG TABS Take 400 mg by mouth 2 (two) times daily. 60 tablet 11   medroxyPROGESTERone (DEPO-PROVERA) 150 MG/ML injection Inject 150 mg into the muscle every 3 (three) months.     meloxicam (MOBIC) 15 MG tablet TAKE ONE TABLET DAILY AS NEEDED WITH FOOD 90 tablet 0   nortriptyline (PAMELOR) 50 MG capsule TAKE 1 CAPSULE BY MOUTH EVERY DAY AT BEDTIME 90 capsule 4   ondansetron (ZOFRAN-ODT) 4 MG disintegrating tablet TAKE 1 TABLET BY MOUTH EVERY 8 HOURS AS NEEDED FOR NAUSEA AND VOMITING 30 tablet 11   Riboflavin 100 MG TABS Take 1 tablet (100 mg total) by mouth 2 (two) times daily. 60 tablet 11   tiZANidine (ZANAFLEX) 4 MG tablet TAKE 1 TABLET BY MOUTH EVERY 6 HOURS AS NEEDED FOR MUSCLE SPASMS. 30 tablet 11   topiramate (TOPAMAX) 100 MG tablet Take 1.5 tablets (150 mg total) by mouth 2 (two) times daily. TAKE 150MG BY MOUTH TWICE DAILY 270 tablet 4   venlafaxine XR (EFFEXOR-XR) 75 MG 24 hr capsule Take 1 capsule (75 mg total) by mouth daily with breakfast. 30 capsule 11   No facility-administered medications prior to visit.     PAST MEDICAL HISTORY: Past Medical History:  Diagnosis Date   DDD (degenerative disc disease), lumbar    Headache    Other disorders of optic nerve, not elsewhere classified, unspecified  eye    Stroke ([place])    TTP (thrombotic thrombocytopenic purpura) ([place])     PAST SURGICAL HISTORY: Past Surgical History:  Procedure Laterality Date   CESAREAN SECTION     CHOLECYSTECTOMY     TEE WITHOUT CARDIOVERSION N/A 08/22/2017   Procedure: TRANSESOPHAGEAL ECHOCARDIOGRAM (TEE);  Surgeon: Skeet Latch, MD;  Location: [facility] ENDOSCOPY;  Service: Cardiovascular;  Laterality: N/A;   WISDOM TOOTH EXTRACTION      FAMILY HISTORY: Family History  Problem Relation Age of Onset   Diabetes Mother    Hypertension Mother    Ovarian cancer Mother        Hysterectomy   Healthy Father    Ovarian cancer Maternal Grandmother        Hysterectomy    SOCIAL HISTORY: Social History  Socioeconomic History   Marital status: Single    Spouse name: Not on file   Number of children: 1   Years of education: In college   Highest education level: Not on file  Occupational History   Occupation: Public relations account executive strain: Not on file   Food insecurity:    Worry: Not on file    Inability: Not on file   Transportation needs:    Medical: Not on file    Non-medical: Not on file  Tobacco Use   Smoking status: Former Smoker    Packs/day: 0.50    Last attempt to quit: 08/27/2017    Years since quitting: 1.5   Smokeless tobacco: Never Used  Substance and Sexual Activity   Alcohol use: No    Alcohol/week: 0.0 standard drinks   Drug use: No   Sexual activity: Yes    Birth control/protection: Pill  Lifestyle   Physical activity:    Days per week: Not on file    Minutes per session: Not on file   Stress: Not on file  Relationships   Social connections:    Talks on phone: Not on file    Gets together: Not on file    Attends religious service: Not on file    Active member of club or organization: Not on file    Attends meetings of clubs or organizations: Not on file    Relationship status: Not on file   Intimate partner violence:     Fear of current or ex partner: Not on file    Emotionally abused: Not on file    Physically abused: Not on file    Forced sexual activity: Not on file  Other Topics Concern   Not on file  Social History Narrative   Lives at home with her mother and daughter.   3-4 cups caffeine daily.   Left-handed.     PHYSICAL EXAM  Vitals:   02/28/19 1351  BP: 137/87  Pulse: (!) 103  Weight: 221 lb 8 oz (100.5 kg)  Height: 5' 5"  (1.651 m)   Body mass index is 36.86 kg/m.    PHYSICAL EXAMNIATION:  Gen: NAD, conversant, well nourised, obese, well groomed                     Cardiovascular: Regular rate rhythm, no peripheral edema, warm, nontender. Eyes: Conjunctivae clear without exudates or hemorrhage Neck: Supple, no carotid bruits. Pulmonary: Clear to auscultation bilaterally   NEUROLOGICAL EXAM:  MENTAL STATUS: Speech:    Speech is normal; fluent and spontaneous with normal comprehension.  Cognition:     Orientation to time, place and person     Normal recent and remote memory     Normal Attention span and concentration     Normal Language, naming, repeating,spontaneous speech     Fund of knowledge   CRANIAL NERVES: CN II: Visual fields are full to confrontation.Pupils are round equal and briskly reactive to light. CN III, IV, VI: extraocular movement are normal. No ptosis. CN V: Facial sensation is intact to pinprick in all 3 divisions bilaterally. Corneal responses are intact.  CN VII: mild left lower face weakness. CN VIII: Hearing is normal to rubbing fingers CN IX, X: Palate elevates symmetrically. Phonation is normal. CN XI: Head turning and shoulder shrug are intact CN XII: Tongue is midline with normal movements and no atrophy.  MOTOR: She has mild left arm and leg weakness  REFLEXES: Reflexes are  2+ and symmetric at the biceps, triceps, knees, and ankles. Plantar responses are flexor.  SENSORY: Intact to light touch, pinprick, positional and vibratory  sensation are intact in fingers and toes.  COORDINATION: Rapid alternating movements and fine finger movements are intact. There is no dysmetria on finger-to-nose and heel-knee-shin.    GAIT/STANCE: Mildly unsteady, dragging her left leg Romberg is absent.    DIAGNOSTIC DATA (LABS, IMAGING, TESTING)  ASSESSMENT AND PLAN  33 y.o. 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 Carolynn Sayers in February 2019 showed no evidence of papillary edema  Recent onset transient word finding difficulties right hand paresthesia  MRI of the brain to rule out structural lesion involving left hemisphere Chronic migraine headaches,   Over the years, she has tried and failed Topamax, propranolol, nortriptyline, Effexor, gabapentin,  Continue Topamax 150 mg twice a day, higher dose of Effexor 75 mg every day, nortriptyline 50 mg daily  Magnesium oxide 400 mg twice a day, riboflavin 100 mg twice a day as migraine prevention, gabapentin  Cambia, zofran, tizanidine as needed.  BOTOX injection was performed according to protocol by [employer]. 100 units of BOTOX was dissolved into 2 cc NS.            Extra 25 Units were injected into bilateral parietal and temporal region, 10 units of BOTOX to each masseter muscle.   Patient tolerate the injection well. Will return for repeat injection in 3 months  Add on Aimovig 70 mg SQ.  Marcial Pacas, M.D. Ph.D.  [facility] [place], [place] [address] Phone: [phone] Fax:      [phone]

## 2019-02-28 NOTE — Telephone Encounter (Signed)
Medicaid order sent to [facility]. They obtain the auth and reach out to the pt to schedule.  °

## 2019-02-28 NOTE — Telephone Encounter (Signed)
The appt that was made today for 05/30/19 is that suppose to be a office appt or botox?

## 2019-03-01 ENCOUNTER — Telehealth: Payer: Self-pay | Admitting: *Deleted

## 2019-03-01 NOTE — Telephone Encounter (Addendum)
Started PA for [place] on CMM, key: [id number]. Dx: Z48.270. Failed: topamax, tizanidine, nortriptyline, ibuprofen, propranolol, effexor. Faxed last 2 office notes to [facility]. Was unable to complete PA online. [place] [employer] form filled out, on [employer] for signature. Will fax last 2 office notes with  Tracks PA form.

## 2019-03-01 NOTE — Telephone Encounter (Signed)
Your welcome.

## 2019-03-01 NOTE — Telephone Encounter (Signed)
[place] [employer] has placed Cambia under the triptan category.  The patient has also tried sumatriptan and rizatriptan in the past.  The PA was completed verbally with [place] [employer] [phone]).  Pt OI#[id number].  BR#[id number].  Decision pending.

## 2019-03-01 NOTE — Telephone Encounter (Signed)
She is coming back for Botox.  The appt type has been updated.

## 2019-03-02 ENCOUNTER — Telehealth: Payer: Self-pay | Admitting: Neurology

## 2019-03-02 MED ORDER — ALPRAZOLAM 1 MG PO TABS: ORAL_TABLET | ORAL | 0 refills | Status: DC

## 2019-03-02 NOTE — Telephone Encounter (Signed)
Pt states that she has an MRI scheduled for 03/09/19 and she states she is claustrophobic and is needing something prescribed to her. Please send to [facility] [address]. Please advise.

## 2019-03-02 NOTE — Telephone Encounter (Signed)
I called pt back. Relayed message below. She verbalized understanding. Her mother is bringing her to appt.

## 2019-03-02 NOTE — Telephone Encounter (Signed)
Received VO from Dr. Terrace Arabia- Xanax 1mg  tablet, take 1 tablet prior to test. Can take additional tablet if needed. Must have driver to and from test. Can cause drowsiness.

## 2019-03-05 NOTE — Telephone Encounter (Signed)
PA approved through 02/24/2020.

## 2019-03-06 ENCOUNTER — Ambulatory Visit
Admission: RE | Admit: 2019-03-06 | Discharge: 2019-03-06 | Disposition: A | Discharge status: Home / Self Care | Payer: Medicaid Other | Source: Ambulatory Visit | Attending: Neurology | Admitting: Neurology

## 2019-03-06 ENCOUNTER — Other Ambulatory Visit: Payer: Self-pay

## 2019-03-06 DIAGNOSIS — R479 Unspecified speech disturbances: Secondary | ICD-10-CM

## 2019-03-09 ENCOUNTER — Other Ambulatory Visit: Payer: Medicaid Other

## 2019-03-09 NOTE — Telephone Encounter (Signed)
Medicaid [place]: [id number] (exp. 03/06/19 to 04/05/19) patient had MRI at [facility] on 03/06/19

## 2019-03-23 ENCOUNTER — Other Ambulatory Visit: Payer: Self-pay | Admitting: Neurology

## 2019-04-18 ENCOUNTER — Other Ambulatory Visit: Payer: Self-pay | Admitting: Neurology

## 2019-04-19 NOTE — Telephone Encounter (Signed)
Message sent to provider 

## 2019-04-23 ENCOUNTER — Other Ambulatory Visit: Payer: Self-pay

## 2019-05-25 ENCOUNTER — Other Ambulatory Visit: Payer: Self-pay | Admitting: Neurology

## 2019-05-29 ENCOUNTER — Telehealth: Payer: Self-pay | Admitting: Neurology

## 2019-05-29 NOTE — Telephone Encounter (Signed)
She will have her daughter with her but understands they must both be screened for COVID-19 and wear a mask.

## 2019-05-29 NOTE — Telephone Encounter (Signed)
I called to confirm apt and go over check in protocols. Patient was advised but she stated her daughter would be with her and she does not feel she can leave her at home alone. Please call and advise.

## 2019-05-30 ENCOUNTER — Other Ambulatory Visit: Payer: Self-pay

## 2019-05-30 ENCOUNTER — Encounter: Payer: Self-pay | Admitting: Neurology

## 2019-05-30 ENCOUNTER — Ambulatory Visit (INDEPENDENT_AMBULATORY_CARE_PROVIDER_SITE_OTHER): Payer: Medicaid Other | Admitting: Neurology

## 2019-05-30 VITALS — BP 130/91 | HR 116 | Temp 97.7°F | Wt 230.8 lb

## 2019-05-30 DIAGNOSIS — G43709 Chronic migraine without aura, not intractable, without status migrainosus: Secondary | ICD-10-CM | POA: Diagnosis not present

## 2019-05-30 DIAGNOSIS — IMO0002 Reserved for concepts with insufficient information to code with codable children: Secondary | ICD-10-CM

## 2019-05-30 MED ORDER — AIMOVIG 70 MG/ML ~~LOC~~ SOAJ: 140.0000 mg | SUBCUTANEOUS | 11 refills | Status: DC

## 2019-05-30 NOTE — Progress Notes (Signed)
[facility]  PATIENT: Krista Maddox DOB: 03-18-1986   HISTORY OF PRESENT ILLNESS:Krista Maddox is a 33 years old right-handed female, seen in consultation by his ophthalmologist Dr. Alois Cliche for evaluation of bilateral papillary edema. Initial evaluation was on November 03 2015   I reviewed notes from Dr. Idolina Primer in May 28 2015, patient was seen for complains of blurry vision, especially seen distance, on examination, visual acuity right eye 20/25 minus, left 20/30 plus, bilateral papillary edema  She denied previous history of headache, since May 2016, she began to notice frequent headaches, bilateral retro-orbital area constant pressure, 8 out of 10 on daily basis, sometimes it would exacerbate to a more severe 10 out of 10 pounding headaches, with associated light noise sensitivity, nauseous, bending down or getting up quickly also causing her dizzines.  She has been taking up to 12 tablets of ibuprofen each day, which only temporarily helps her headaches,  She received etonogestrel (NEXPLANON) 68 MG IMPL implant in July 14th 2016. She complains of 25 pound weight gain since last year.  MRI brain In June 2016: Enlarged partially empty sella. Enlarged optic nerve sheaths. Findings are non-specific but can be seen in association with idiopathic intracranial hypertension.No acute findings. No masses or hydrocephalus.   She had a fluoroscopy guided lumbar puncture in June 25 2015, with open pressure 33 cm water, she has developed post LP headaches, eventually had blood patch in June 30 2015, with immediate relief of her headaches  Spinal fluid testing showed total protein 43, glucose 53, WBC 20, RBC 1, 72% lymphocytes, 24% monocytes  She continue has mild blurry vision, occasionally double vision on extreme gaze to the left or right, low-grade bifrontal pressure headaches, she has tried Maxalt as needed, which has been helpful for moderate and severe  headaches. She is tolerating Topamax 100 mg twice a day well  She began to have recurrent headaches since September 2016, 40 pound weight gain over one year,  she contributed to [facility] implant that  was put in 2015, had weight gain, ws taken out in Sep, 2016   repeat LP  in Sep 29 2015, showed Open pressure is 26 cm H2O. she did not get post LP headache this time, but she had needle site low back pain, mild left hip pain since the last lumbar puncture in October 17,  Lumbar puncture January 19 2016, open pressure was 23 cm water, unfortunately she developed post LP headache, failed to improve by Fioricet, eventually had blood patch January 22 2016, she was evaluated by neurosurgeon Dr. Kathyrn Sheriff for potential shunt replacement, is referred to [facility]  MRI scan lumbar spine  10/20/16 showing prominent disc degenerative change at L5-S1 with broad-based disc osteophyte protrusion resulting in severe bilateral foraminal narrowing and likely encroachment on exiting nerve roots.   Update October 13 2017: I reviewed discharge summary on September 4 to 07-2017, she presented to the emergency room complains of acute onset left-sided numbness with associated severe migraine, MRI of the brain showed acute infarction in the right cerebellum, several small acute infarction in the right posterior frontal region, echocardiogram showed ejection fraction 55-60%, normal wall motion, CT angiogram showed patent artery, LDL 89, A1c 4.8, transcranial Doppler study which review PFO, lower extremity venous Doppler study was normal, she was started on Lipitor, and aspirin 81 mg,  TEE on August 22 2017, trivial MR, no thrombosis or mass,  CT angiogram of head and neck no significant abnormalities.  She presented  to the emergency room October 11 2017 for acute  onset right-sided headache, with residual numbness of left face and hand from her previous stroke on August 15 2017, she does not feel good, left leg  feel weak, might gave out on her.   Extensive laboratory evaluation in September 2018, mild elevated ESR 28, negative ANA, cardiolipin antibody, lupus anticoagulant, UDS, Sjogren antibody, anti-DNA antibody, ANCA, rheumatoid factor, C-reactive protein was slightly elevated 8.7  Repeat MRI of the brain October 11 2017, showed no acute abnormality, previous signal abnormality in September 2018 had resolved,  She has has daily headache all over, 9/10, now taking Fioricet twice a day 2 weeks, blurry vision  She is still taking topamax 133m bid, gabapentin 300/6048m nortriptyline 5037mhs.   UPDATE Nov 01 2017: She has been having daily headaches since September 2018, even before that, she was having more than 15 days in a month, each headache lasts more than 4 hours, moderate to severe,  Over the years, she has tried and failed Topamax, nortriptyline, propanolol, gabapentin, Effexor.   She is now taking Cambia as needed with moderate help, She complains of mildly blurry vision.  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  Update March 09, 2018: She received one Botox injection in November 2018, reported mild improvement last for 1 month, now she is having 3-4 migraine headaches each week, cambia helps,  30-day cardiac monitoring showed no significant abnormality in December 2018 Evaluation by ophthalmologist Dr. GroCarolynn Sayers February 2019 was normal.  UPDATE Sept 24 2019: She continue complains of almost daily headaches, mild to moderate, but often exacerbated to a more severe headaches, she takes Ambien as needed, she denies significant visual change, recent ophthalmology evaluation in May by Dr. GroKaty Fitchported normal, most recent lumbar puncture was in February 2017 open pressure was 23 cm water  Resume BOTOX injection as migraine prevention on Sept 24 2019,, she is also on Topamax 100 mg twice a day, Effexor XR 75 mg daily, nortriptyline 50 mg every night gabapentin as migraine  prevention  UPDATE Nov 29 2018: Addendum about her stroke work up with vascular neurologist Dr. 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 TEE was negative and she had h/o TTP hence not a good long term anticoagulation canadidate and given young age and absence of vascular risk factore low probability of finding AFIB. We typically do not do loop in pts below 40 81ars of age unless there are several vascular risk factors identified. She also had history of birth control pills and migraine which may have contributed to hypercoagulability. So I feel she is cryptogenic stroke in young with negative evaluation.   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 Asymmetry of the transverse sinus systems is a normal variant.  She only report mild improvement from previous Botox injection 3 months ago, continue have 4 headaches each week, respond somewhat to CamWillistonrch 18 2020: She started aimovig since December 2019, she reported mild improvement along with Botox injection every 3 months, but she still has 3 headaches in a week, not taking any abortive treatment, previously CamCathren Harshrked very well  She complains of few months history of intermittent sudden onset mild confusion, word finding difficulties, also transient right hand numbness, that was independent of her language difficulty  EEG was ordered is pending  UPDATE May 30 2019: She responded very well to previous injection.  There was no significant side effect noted.  She continue have 4-5 migraine headaches each week, lasting for few hours, Cambia works well most of the time  REVIEW OF SYSTEMS: Full 14 system review of systems performed and notable only for those listed, all others are neg:    ALLERGIES: Allergies  Allergen Reactions   Tape Rash    Cannot tolerate paper tape    HOME  MEDICATIONS: Outpatient Medications Prior to Visit  Medication Sig Dispense Refill   acetaminophen (TYLENOL) 325 MG tablet Take 650 mg by mouth every 6 (six) hours as needed.     ALPRAZolam (XANAX) 1 MG tablet Take 1 tablet by mouth 30 min prior to test. Can take additional tablet at time of test if needed. Must have driver. Can cause drowsiness. 3 tablet 0   aspirin EC 325 MG tablet Take 1 tablet (325 mg total) by mouth daily. 30 tablet 0   atorvastatin (LIPITOR) 40 MG tablet Take 1 tablet (40 mg total) by mouth daily. 90 tablet 3   BOTOX 100 units SOLR injection FOR OFFICE ADMINISTRATION. PHYSICIAN TO INJECT 155 UNITS INTRAMUSCULARLY INTO HEAD AND NECK MUSCLES EVERY 3 MONTHS 2 each 3   co-enzyme Q-10 50 MG capsule Take 50 mg by mouth 2 (two) times daily.     Diclofenac Potassium (CAMBIA) 50 MG PACK Take 50 mg by mouth daily as needed. 20 each 11   Erenumab-aooe (AIMOVIG) 70 MG/ML SOAJ Inject 70 mg into the skin every 30 (thirty) days. 1 pen 11   gabapentin (NEURONTIN) 300 MG capsule TAKE 1 CAPSULE BY MOUTH TWICE A DAY MAY INCREASE TO 2 CAPS TWICE DAILY IF NEEDED 120 capsule 11   Magnesium Oxide 400 (240 Mg) MG TABS Take 400 mg by mouth 2 (two) times daily. 60 tablet 11   medroxyPROGESTERone (DEPO-PROVERA) 150 MG/ML injection Inject 150 mg into the muscle every 3 (three) months.     meloxicam (MOBIC) 15 MG tablet TAKE ONE TABLET DAILY AS NEEDED WITH FOOD 90 tablet 0   nortriptyline (PAMELOR) 50 MG capsule TAKE 1 CAPSULE BY MOUTH EVERY DAY AT BEDTIME 90 capsule 3   ondansetron (ZOFRAN-ODT) 4 MG disintegrating tablet TAKE 1 TABLET BY MOUTH EVERY 8 HOURS AS NEEDED FOR NAUSEA AND VOMITING 30 tablet 11   Riboflavin 100 MG TABS Take 1 tablet (100 mg total) by mouth 2 (two) times daily. 60 tablet 11   tiZANidine (ZANAFLEX) 4 MG tablet TAKE 1 TABLET BY MOUTH EVERY 6 HOURS AS NEEDED FOR MUSCLE SPASMS. 30 tablet 11   topiramate (TOPAMAX) 100 MG tablet TAKE 1 & 1/2 TABLET (150 MG TOTAL) BY  MOUTH 2 TIMES DAILY. 270 tablet 3   venlafaxine XR (EFFEXOR-XR) 75 MG 24 hr capsule Take 1 capsule (75 mg total) by mouth daily with breakfast. 30 capsule 11   No facility-administered medications prior to visit.     PAST MEDICAL HISTORY: Past Medical History:  Diagnosis Date   DDD (degenerative disc disease), lumbar    Headache    Other disorders of optic nerve, not elsewhere classified, unspecified eye    Stroke ([place])    TTP (thrombotic thrombocytopenic purpura) ([place])     PAST SURGICAL HISTORY: Past Surgical History:  Procedure Laterality Date   CESAREAN SECTION     CHOLECYSTECTOMY     TEE WITHOUT CARDIOVERSION N/A 08/22/2017   Procedure: TRANSESOPHAGEAL ECHOCARDIOGRAM (TEE);  Surgeon: Skeet Latch, MD;  Location: [place];  Service: Cardiovascular;  Laterality: N/A;   WISDOM TOOTH EXTRACTION      FAMILY HISTORY: Family History  Problem Relation Age of Onset   Diabetes Mother    Hypertension Mother    Ovarian cancer Mother        Hysterectomy   Healthy Father    Ovarian cancer Maternal Grandmother        Hysterectomy    SOCIAL HISTORY: Social History   Socioeconomic History   Marital status: Single    Spouse name: Not on file   Number of children: 1   Years of education: In college   Highest education level: Not on file  Occupational History   Occupation: Public relations account executive strain: Not on file   Food insecurity    Worry: Not on file    Inability: Not on file   Transportation needs    Medical: Not on file    Non-medical: Not on file  Tobacco Use   Smoking status: Former Smoker    Packs/day: 0.50    Quit date: 08/27/2017    Years since quitting: 1.7   Smokeless tobacco: Never Used  Substance and Sexual Activity   Alcohol use: No    Alcohol/week: 0.0 standard drinks   Drug use: No   Sexual activity: Yes    Birth control/protection: Pill  Lifestyle   Physical activity    Days per week:  Not on file    Minutes per session: Not on file   Stress: Not on file  Relationships   Social connections    Talks on phone: Not on file    Gets together: Not on file    Attends religious service: Not on file    Active member of club or organization: Not on file    Attends meetings of clubs or organizations: Not on file    Relationship status: Not on file   Intimate partner violence    Fear of current or ex partner: Not on file    Emotionally abused: Not on file    Physically abused: Not on file    Forced sexual activity: Not on file  Other Topics Concern   Not on file  Social History Narrative   Lives at home with her mother and daughter.   3-4 cups caffeine daily.   Left-handed.     PHYSICAL EXAM  Vitals:   05/30/19 1407  BP: (!) 130/91  Pulse: (!) 116  Temp: 97.7 F (36.5 C)  Weight: 230 lb 12 oz (104.7 kg)   Body mass index is 38.4 kg/m.    PHYSICAL EXAMNIATION:     NEUROLOGICAL EXAM:  MENTAL STATUS: Speech:    Speech is normal; fluent and spontaneous with normal comprehension.  Cognition:     Orientation to time, place and person     Normal recent and remote memory     Normal Attention span and concentration     Normal Language, naming, repeating,spontaneous speech     Fund of knowledge   CRANIAL NERVES: CN II: Visual fields are full to confrontation.Pupils are round equal and briskly reactive to light. CN III, IV, VI: extraocular movement are normal. No ptosis. CN V: Facial sensation is intact to pinprick in all 3 divisions bilaterally. Corneal responses are intact.  CN VII: mild left lower face weakness. CN VIII: Hearing is normal to rubbing fingers CN IX, X: Palate elevates symmetrically. Phonation is normal. CN XI: Head turning and shoulder shrug are intact CN XII: Tongue is midline with  normal movements and no atrophy.  MOTOR: She has mild left arm and leg weakness   COORDINATION: Rapid alternating movements and fine finger movements  are intact. There is no dysmetria on finger-to-nose and heel-knee-shin.    GAIT/STANCE: Mildly unsteady, dragging her left leg Romberg is absent.      ASSESSMENT AND PLAN  33 y.o. 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 Carolynn Sayers in February 2019 showed no evidence of papillary edema  Recent onset transient word finding difficulties right hand paresthesia  MRI of the brain to rule out structural lesion involving left hemisphere Chronic migraine headaches,   Over the years, she has tried and failed Topamax, propranolol, nortriptyline, Effexor, gabapentin,  Continue Topamax 150 mg twice a day, higher dose of Effexor 75 mg every day, nortriptyline 50 mg daily  Magnesium oxide 400 mg twice a day, riboflavin 100 mg twice a day as migraine prevention, gabapentin  Aimovig 31m every 30 days was added on since Dec 2019, she has mild improvement in her headache, will increase to 146mevery 30 days  Cambia, zofran, tizanidine as needed.  BOTOX injection was performed according to protocol by [employer]. 100 units of BOTOX was dissolved into 2 cc NS.        Skip the injection to bilateral corrugate and procereus    Extra 40 Units were injected into bilateral parietal and temporal region, 10 units of BOTOX to each masseter muscle.   Patient tolerate the injection well. Will return for repeat injection in 3 months    YiMarcial PacasM.D. Ph.D.  [facility] [id number]: [id number]:      [phone]

## 2019-05-30 NOTE — Progress Notes (Signed)
**  Botox 100 units x 2 vials, NDC 9396-8864-84, Lot [id number], Exp 01/2022, specialty pharmacy.//mck,rn**

## 2019-07-22 ENCOUNTER — Other Ambulatory Visit: Payer: Self-pay | Admitting: Neurology

## 2019-07-23 ENCOUNTER — Telehealth: Payer: Self-pay | Admitting: Neurology

## 2019-07-23 ENCOUNTER — Telehealth: Payer: Self-pay | Admitting: *Deleted

## 2019-07-23 ENCOUNTER — Other Ambulatory Visit: Payer: Self-pay | Admitting: *Deleted

## 2019-07-23 MED ORDER — TIZANIDINE HCL 4 MG PO TABS: ORAL_TABLET | ORAL | 11 refills | Status: DC

## 2019-07-23 NOTE — Telephone Encounter (Signed)
Reports having a headache, with burning sensations, since 07/18/2019.  She has tried [place] and Tylenol repeatedly without only temporary relief of symptoms.

## 2019-07-23 NOTE — Telephone Encounter (Signed)
Email from patient:  Good morning. For the last 3 days I have had a burning sensation headache. None of my medications are helping to take away the headache. It feels like my brain is on fire is the best way I can describe it. I have also taken Tylenol along with my medicines to try and help it and it isn't helping it. Thank you.

## 2019-07-23 NOTE — Telephone Encounter (Signed)
See other phone note for more information. 

## 2019-07-23 NOTE — Telephone Encounter (Signed)
Per vo by Dr. Krista Blue, may offer infusion or nerve block.  The patient would like to try the nerve block.  She has been added to the schedule on 07/24/2019.

## 2019-07-23 NOTE — Telephone Encounter (Signed)
Pt states she has had a headache since Wednesday and it feels like "her brain is on fire". She also states that her medications are not working not even if she takes Tylenol with them. Please advise.

## 2019-07-24 ENCOUNTER — Encounter: Payer: Self-pay | Admitting: Neurology

## 2019-07-24 ENCOUNTER — Ambulatory Visit: Payer: Medicaid Other | Admitting: Neurology

## 2019-07-24 ENCOUNTER — Other Ambulatory Visit: Payer: Self-pay

## 2019-07-24 VITALS — BP 128/82 | HR 96 | Temp 98.5°F | Ht 65.0 in | Wt 234.5 lb

## 2019-07-24 DIAGNOSIS — G43709 Chronic migraine without aura, not intractable, without status migrainosus: Secondary | ICD-10-CM

## 2019-07-24 DIAGNOSIS — IMO0002 Reserved for concepts with insufficient information to code with codable children: Secondary | ICD-10-CM

## 2019-07-24 DIAGNOSIS — G932 Benign intracranial hypertension: Secondary | ICD-10-CM

## 2019-07-24 NOTE — Progress Notes (Signed)
   History: Persistent bilateral frontal retro-orbital area headache for 6 days, noted multiple home remedy treatment    Bilateral occipital and trigeminal nerve block; trigger point injection of bilateral cervical and upper trapezius muscles for intractable headache  Bupivacaine 0.5% was injected on the scalp bilaterally at several locations:  -On the occipital area of the head, 3 injections each side, 0.5 cc per injection at the midpoint between the mastoid process and the occipital protuberance. 2 other injections were done one finger breadth from the initial injection, one at a 10 o'clock position and the other at a 2 o'clock position.  -2 injections of 0.5 cc were done in the temporal regions, 2 fingerbreadths above the tragus of the ear, with the second injection one fingerbreadth posteriorly to the first.  -2 injections were done on the brow, 1 in the medial brow and one over the supraorbital nerve notch, with 0.1 cc for each injection  -1 injection each side of 0.5 cc was done anterior to the tragus of the ear for a trigeminal ganglion injection  -0.5 cc was injected into bilateral upper trapezius and bilateral upper cervical paraspinals   The patient tolerated the injections well, no complications of the procedure were noted. Injections were made with a 27-gauge needle.

## 2019-08-24 ENCOUNTER — Telehealth: Payer: Self-pay | Admitting: Neurology

## 2019-08-24 NOTE — Telephone Encounter (Signed)
Cecily called to confirm delivery date and address for the pts medication. Please advise.

## 2019-08-27 NOTE — Telephone Encounter (Signed)
I called and scheduled delivery. DW  °

## 2019-09-05 ENCOUNTER — Encounter: Payer: Self-pay | Admitting: Neurology

## 2019-09-05 ENCOUNTER — Ambulatory Visit (INDEPENDENT_AMBULATORY_CARE_PROVIDER_SITE_OTHER): Payer: Medicaid Other | Admitting: Neurology

## 2019-09-05 ENCOUNTER — Other Ambulatory Visit: Payer: Self-pay

## 2019-09-05 VITALS — BP 138/92 | HR 96 | Temp 98.0°F | Ht 65.0 in | Wt 236.0 lb

## 2019-09-05 DIAGNOSIS — G43709 Chronic migraine without aura, not intractable, without status migrainosus: Secondary | ICD-10-CM

## 2019-09-05 DIAGNOSIS — IMO0002 Reserved for concepts with insufficient information to code with codable children: Secondary | ICD-10-CM

## 2019-09-05 MED ORDER — UBRELVY 50 MG PO TABS: 50.0000 mg | ORAL_TABLET | ORAL | 11 refills | Status: DC | PRN

## 2019-09-05 NOTE — Progress Notes (Signed)
**  Botox 100 units x 2 vials, NDC 0023-1145-01, Lot  [id number], Exp 02/2022, specialty pharmacy.//mck,rn** 

## 2019-09-05 NOTE — Progress Notes (Signed)
[facility]  PATIENT: Krista Maddox DOB: 03-Feb-1986   HISTORY OF PRESENT ILLNESS:Krista Maddox is a 33 years old right-handed female, seen in consultation by his ophthalmologist Dr. Alois Cliche for evaluation of bilateral papillary edema. Initial evaluation was on November 03 2015   I reviewed notes from Dr. Idolina Primer in May 28 2015, patient was seen for complains of blurry vision, especially seen distance, on examination, visual acuity right eye 20/25 minus, left 20/30 plus, bilateral papillary edema  She denied previous history of headache, since May 2016, she began to notice frequent headaches, bilateral retro-orbital area constant pressure, 8 out of 10 on daily basis, sometimes it would exacerbate to a more severe 10 out of 10 pounding headaches, with associated light noise sensitivity, nauseous, bending down or getting up quickly also causing her dizzines.  She has been taking up to 12 tablets of ibuprofen each day, which only temporarily helps her headaches,  She received etonogestrel (NEXPLANON) 68 MG IMPL implant in July 14th 2016. She complains of 25 pound weight gain since last year.  MRI brain In June 2016: Enlarged partially empty sella. Enlarged optic nerve sheaths. Findings are non-specific but can be seen in association with idiopathic intracranial hypertension.No acute findings. No masses or hydrocephalus.   She had a fluoroscopy guided lumbar puncture in June 25 2015, with open pressure 33 cm water, she has developed post LP headaches, eventually had blood patch in June 30 2015, with immediate relief of her headaches  Spinal fluid testing showed total protein 43, glucose 53, WBC 20, RBC 1, 72% lymphocytes, 24% monocytes  She continue has mild blurry vision, occasionally double vision on extreme gaze to the left or right, low-grade bifrontal pressure headaches, she has tried Maxalt as needed, which has been helpful for moderate and severe  headaches. She is tolerating Topamax 100 mg twice a day well  She began to have recurrent headaches since September 2016, 40 pound weight gain over one year,  she contributed to [facility] implant that  was put in 2015, had weight gain, ws taken out in Sep, 2016   repeat LP  in Sep 29 2015, showed Open pressure is 26 cm H2O. she did not get post LP headache this time, but she had needle site low back pain, mild left hip pain since the last lumbar puncture in October 17,  Lumbar puncture January 19 2016, open pressure was 23 cm water, unfortunately she developed post LP headache, failed to improve by Fioricet, eventually had blood patch January 22 2016, she was evaluated by neurosurgeon Dr. Kathyrn Sheriff for potential shunt replacement, is referred to [facility].  MRI scan lumbar spine  10/20/16 showing prominent disc degenerative change at L5-S1 with broad-based disc osteophyte protrusion resulting in severe bilateral foraminal narrowing and likely encroachment on exiting nerve roots.   Update October 13 2017: I reviewed discharge summary on September 4 to 07-2017, she presented to the emergency room complains of acute onset left-sided numbness with associated severe migraine, MRI of the brain showed acute infarction in the right cerebellum, several small acute infarction in the right posterior frontal region, echocardiogram showed ejection fraction 55-60%, normal wall motion, CT angiogram showed patent artery, LDL 89, A1c 4.8, transcranial Doppler study which review PFO, lower extremity venous Doppler study was normal, she was started on Lipitor, and aspirin 81 mg,  TEE on August 22 2017, trivial MR, no thrombosis or mass,  CT angiogram of head and neck no significant abnormalities.  She presented  to the emergency room October 11 2017 for acute  onset right-sided headache, with residual numbness of left face and hand from her previous stroke on August 15 2017, she does not feel good, left leg  feel weak, might gave out on her.   Extensive laboratory evaluation in September 2018, mild elevated ESR 28, negative ANA, cardiolipin antibody, lupus anticoagulant, UDS, Sjogren antibody, anti-DNA antibody, ANCA, rheumatoid factor, C-reactive protein was slightly elevated 8.7  Repeat MRI of the brain October 11 2017, showed no acute abnormality, previous signal abnormality in September 2018 had resolved,  She has has daily headache all over, 9/10, now taking Fioricet twice a day 2 weeks, blurry vision  She is still taking topamax 168m bid, gabapentin 300/601m nortriptyline 5011mhs.   UPDATE Nov 01 2017: She has been having daily headaches since September 2018, even before that, she was having more than 15 days in a month, each headache lasts more than 4 hours, moderate to severe,  Over the years, she has tried and failed Topamax, nortriptyline, propanolol, gabapentin, Effexor.   She is now taking Cambia as needed with moderate help, She complains of mildly blurry vision.  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  Update March 09, 2018: She received one Botox injection in November 2018, reported mild improvement last for 1 month, now she is having 3-4 migraine headaches each week, cambia helps,  30-day cardiac monitoring showed no significant abnormality in December 2018 Evaluation by ophthalmologist Dr. GroCarolynn Sayers February 2019 was normal.  UPDATE Sept 24 2019: She continue complains of almost daily headaches, mild to moderate, but often exacerbated to a more severe headaches, she takes Ambien as needed, she denies significant visual change, recent ophthalmology evaluation in May by Dr. GroKaty Fitchported normal, most recent lumbar puncture was in February 2017 open pressure was 23 cm water  Resume BOTOX injection as migraine prevention on Sept 24 2019,, she is also on Topamax 100 mg twice a day, Effexor XR 75 mg daily, nortriptyline 50 mg every night gabapentin as migraine  prevention  UPDATE Nov 29 2018: Addendum about her stroke work up with vascular neurologist Dr. 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 TEE was negative and she had h/o TTP hence not a good long term anticoagulation canadidate and given young age and absence of vascular risk factore low probability of finding AFIB. We typically do not do loop in pts below 40 35ars of age unless there are several vascular risk factors identified. She also had history of birth control pills and migraine which may have contributed to hypercoagulability. So I feel she is cryptogenic stroke in young with negative evaluation.   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 Asymmetry of the transverse sinus systems is a normal variant.  She only report mild improvement from previous Botox injection 3 months ago, continue have 4 headaches each week, respond somewhat to CamSwansearch 18 2020: She started aimovig since December 2019, she reported mild improvement along with Botox injection every 3 months, but she still has 3 headaches in a week, not taking any abortive treatment, previously CamCathren Harshrked very well  She complains of few months history of intermittent sudden onset mild confusion, word finding difficulties, also transient right hand numbness, that was independent of her language difficulty  EEG was ordered is pending  UPDATE May 30 2019: She responded very well to previous injection.  There was no significant side effect noted.  She continue have 4-5 migraine headaches each week, lasting for few hours, Cambia works well most of the time  UPDATE Sept 23 2020: Botox injection in June 2020 was helpful, she has to coming on July 24, 2019 for nerve block, which was helpful, new has frequent headaches, taking Cambia as needed, today also add on ubrevyl 50 mg as  needed  REVIEW OF SYSTEMS: Full 14 system review of systems performed and notable only for those listed, all others are neg:    ALLERGIES: Allergies  Allergen Reactions  . Tape Rash    Cannot tolerate paper tape    HOME MEDICATIONS: Outpatient Medications Prior to Visit  Medication Sig Dispense Refill  . acetaminophen (TYLENOL) 325 MG tablet Take 650 mg by mouth every 6 (six) hours as needed.    . ALPRAZolam (XANAX) 1 MG tablet Take 1 tablet by mouth 30 min prior to test. Can take additional tablet at time of test if needed. Must have driver. Can cause drowsiness. 3 tablet 0  . aspirin EC 325 MG tablet Take 1 tablet (325 mg total) by mouth daily. 30 tablet 0  . atorvastatin (LIPITOR) 40 MG tablet Take 1 tablet (40 mg total) by mouth daily. 90 tablet 3  . BOTOX 100 units SOLR injection FOR OFFICE ADMINISTRATION. PHYSICIAN TO INJECT 155 UNITS INTRAMUSCULARLY INTO HEAD AND NECK MUSCLES EVERY 3 MONTHS 2 each 3  . co-enzyme Q-10 50 MG capsule Take 50 mg by mouth 2 (two) times daily.    . Diclofenac Potassium (CAMBIA) 50 MG PACK Take 50 mg by mouth daily as needed. 20 each 11  . Erenumab-aooe (AIMOVIG) 70 MG/ML SOAJ Inject 140 mg into the skin every 30 (thirty) days. 2 pen 11  . gabapentin (NEURONTIN) 300 MG capsule TAKE 1 CAPSULE BY MOUTH TWICE A DAY MAY INCREASE TO 2 CAPS TWICE DAILY IF NEEDED 120 capsule 11  . Magnesium Oxide 400 (240 Mg) MG TABS Take 400 mg by mouth 2 (two) times daily. 60 tablet 11  . meclizine (ANTIVERT) 12.5 MG tablet Take 12.5 mg by mouth 3 (three) times daily as needed for dizziness.    . medroxyPROGESTERone (DEPO-PROVERA) 150 MG/ML injection Inject 150 mg into the muscle every 3 (three) months.    . meloxicam (MOBIC) 15 MG tablet TAKE ONE TABLET DAILY AS NEEDED WITH FOOD 90 tablet 0  . nortriptyline (PAMELOR) 50 MG capsule TAKE 1 CAPSULE BY MOUTH EVERY DAY AT BEDTIME 90 capsule 3  . ondansetron (ZOFRAN-ODT) 4 MG disintegrating tablet TAKE 1 TABLET BY MOUTH EVERY 8  HOURS AS NEEDED FOR NAUSEA AND VOMITING 30 tablet 11  . Riboflavin 100 MG TABS Take 1 tablet (100 mg total) by mouth 2 (two) times daily. 60 tablet 11  . tiZANidine (ZANAFLEX) 4 MG tablet TAKE 1 TABLET BY MOUTH EVERY 6 HOURS AS NEEDED FOR MUSCLE SPASMS. #30/30. 30 tablet 11  . topiramate (TOPAMAX) 100 MG tablet TAKE 1 & 1/2 TABLET (150 MG TOTAL) BY MOUTH 2 TIMES DAILY. 270 tablet 3  . venlafaxine XR (EFFEXOR-XR) 150 MG 24 hr capsule Take 150 mg by mouth daily with breakfast.    . venlafaxine XR (EFFEXOR-XR) 75 MG 24 hr capsule Take 1 capsule (75 mg total) by mouth daily with breakfast. 30 capsule 11   No facility-administered medications prior to visit.     PAST MEDICAL HISTORY: Past Medical History:  Diagnosis Date  .  DDD (degenerative disc disease), lumbar   . Headache   . Other disorders of optic nerve, not elsewhere classified, unspecified eye   . Stroke (HCC)   . TTP (thrombotic thrombocytopenic purpura) (HCC)     PAST SURGICAL HISTORY: Past Surgical History:  Procedure Laterality Date  . CESAREAN SECTION    . CHOLECYSTECTOMY    . TEE WITHOUT CARDIOVERSION N/A 08/22/2017   Procedure: TRANSESOPHAGEAL ECHOCARDIOGRAM (TEE);  Surgeon: Cheboygan, Tiffany, MD;  Location: [facility];  Service: Cardiovascular;  Laterality: N/A;  . WISDOM TOOTH EXTRACTION      FAMILY HISTORY: Family History  Problem Relation Age of Onset  . Diabetes Mother   . Hypertension Mother   . Ovarian cancer Mother        Hysterectomy  . Healthy Father   . Ovarian cancer Maternal Grandmother        Hysterectomy    SOCIAL HISTORY: Social History   Socioeconomic History  . Marital status: Single    Spouse name: Not on file  . Number of children: 1  . Years of education: In college  . Highest education level: Not on file  Occupational History  . Occupation: Dispatcher  Social Needs  . Financial resource strain: Not on file  . Food insecurity    Worry: Not on file    Inability: Not on file  .  Transportation needs    Medical: Not on file    Non-medical: Not on file  Tobacco Use  . Smoking status: Former Smoker    Packs/day: 0.50    Quit date: 08/27/2017    Years since quitting: 2.0  . Smokeless tobacco: Never Used  Substance and Sexual Activity  . Alcohol use: No    Alcohol/week: 0.0 standard drinks  . Drug use: No  . Sexual activity: Yes    Birth control/protection: Pill  Lifestyle  . Physical activity    Days per week: Not on file    Minutes per session: Not on file  . Stress: Not on file  Relationships  . Social connections    Talks on phone: Not on file    Gets together: Not on file    Attends religious service: Not on file    Active member of club or organization: Not on file    Attends meetings of clubs or organizations: Not on file    Relationship status: Not on file  . Intimate partner violence    Fear of current or ex partner: Not on file    Emotionally abused: Not on file    Physically abused: Not on file    Forced sexual activity: Not on file  Other Topics Concern  . Not on file  Social History Narrative   Lives at home with her mother and daughter.   3-4 cups caffeine daily.   Left-handed.     PHYSICAL EXAM  Vitals:   09/05/19 1405  BP: (!) 138/92  Pulse: 96  Temp: 98 F (36.7 C)  Weight: 236 lb (107 kg)  Height: 5' 5" (1.651 m)   Body mass index is 39.27 kg/m.    PHYSICAL EXAMNIATION:     NEUROLOGICAL EXAM:  Facial were symmetric She has mild left arm and leg weakness  ASSESSMENT AND PLAN  33 y.o. 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 Carolynn Sayers in February 2019 showed no evidence of papillary edema  Chronic migraine headaches,   Over  the years, she has tried and failed Topamax, propranolol, nortriptyline, Effexor, gabapentin,  Continue Topamax 150 mg twice a day, higher dose of Effexor 75 mg every day, nortriptyline 50 mg daily  Magnesium oxide 400 mg twice a day, riboflavin 100 mg twice a day as migraine prevention, gabapentin  Aimovig 27m every 30 days was added on since Dec 2019, she has mild improvement in her headache, was increased to 1449mevery 30 days in June 2020  Cambia, zofran, tizanidine as needed.  Add on ubrelvy 50105ms needed  BOTOX injection was performed according to protocol by [employer]. 100 units of BOTOX was dissolved into 2 cc NS.        Skip the injection to bilateral corrugate and procereus    Extra 40 Units were injected into bilateral parietal and temporal region, 10 units of BOTOX to each masseter muscle.   Patient tolerate the injection well. Will return for repeat injection in 3 months    YijMarcial Pacas.D. Ph.D.  [facility] [id number]: [phone]:      [phone]

## 2019-09-06 ENCOUNTER — Telehealth: Payer: Self-pay | Admitting: Neurology

## 2019-09-06 NOTE — Telephone Encounter (Signed)
Pt states she reached out to [facility] today and they could not verify that they received the Rx for her Ubrogepant (UBRELVY) 50 MG TABS.  Pt is asking if it can be resent to them

## 2019-09-06 NOTE — Telephone Encounter (Signed)
Received a fax from [facility] that they needed updated demographics for the patient in order to process the prescription. This information was faxed back to them.  Additionally, the patient has [place] Medicaid and the medication will require a prior authorization.  I completed this over the phone.  Pt VG#[id number] E.  [id number].  Decision is pending.    I called the patient and she has been updated with this information.

## 2019-09-07 NOTE — Telephone Encounter (Signed)
Krista Maddox has been denied by [facility] Medicaid.  No appeal option available.

## 2019-09-10 ENCOUNTER — Telehealth: Payer: Self-pay | Admitting: *Deleted

## 2019-09-10 NOTE — Addendum Note (Signed)
Addended by: Desmond Lope on: 09/10/2019 08:46 AM   Modules accepted: Orders

## 2019-09-10 NOTE — Telephone Encounter (Addendum)
Email from patient:     I woke up Saturday morning with my left eye drooping. Since it's on the left side of my face and I can't feel on this side I am unsure if I should be worried. It is obstructing my vision, and putting more strain on my right eye to see.   Dr. Krista Blue has reviewed the pictures sent by patient.  She had Botox injections on 09/05/2019 and noted her eye drooping on 09/08/2019.  Unfortunately, this is a potential risk of Botox  that will wear off.  However, the wearing off of the medication is slow and can take several weeks.  It is a gradual improvement over time.  She will need to make sure she is careful to protect her eye. She will use a warm compress for discomfort. She verbalized understanding.  She will call use back with any further concerns.

## 2019-09-10 NOTE — Telephone Encounter (Signed)
Dr. Krista Blue has been notified of the denial.  The patient may continue to use her other medications.  The patient has been informed and is agreeable to this plan.

## 2019-09-18 ENCOUNTER — Encounter: Payer: Self-pay | Admitting: Neurology

## 2019-09-18 ENCOUNTER — Other Ambulatory Visit: Payer: Self-pay

## 2019-09-18 ENCOUNTER — Ambulatory Visit: Payer: Medicaid Other | Admitting: Neurology

## 2019-09-18 VITALS — BP 138/82 | HR 88 | Temp 98.0°F | Ht 65.0 in | Wt 233.5 lb

## 2019-09-18 DIAGNOSIS — H02402 Unspecified ptosis of left eyelid: Secondary | ICD-10-CM

## 2019-09-18 DIAGNOSIS — IMO0002 Reserved for concepts with insufficient information to code with codable children: Secondary | ICD-10-CM

## 2019-09-18 DIAGNOSIS — G43709 Chronic migraine without aura, not intractable, without status migrainosus: Secondary | ICD-10-CM | POA: Diagnosis not present

## 2019-09-18 NOTE — Progress Notes (Signed)
[facility]  PATIENT: Krista Maddox DOB: 03-Feb-1986   HISTORY OF PRESENT ILLNESS:Krista Maddox is a 33 years old right-handed female, seen in consultation by his ophthalmologist Dr. Alois Cliche for evaluation of bilateral papillary edema. Initial evaluation was on November 03 2015   I reviewed notes from Dr. Idolina Primer in May 28 2015, patient was seen for complains of blurry vision, especially seen distance, on examination, visual acuity right eye 20/25 minus, left 20/30 plus, bilateral papillary edema  She denied previous history of headache, since May 2016, she began to notice frequent headaches, bilateral retro-orbital area constant pressure, 8 out of 10 on daily basis, sometimes it would exacerbate to a more severe 10 out of 10 pounding headaches, with associated light noise sensitivity, nauseous, bending down or getting up quickly also causing her dizzines.  She has been taking up to 12 tablets of ibuprofen each day, which only temporarily helps her headaches,  She received etonogestrel (NEXPLANON) 68 MG IMPL implant in July 14th 2016. She complains of 25 pound weight gain since last year.  MRI brain In June 2016: Enlarged partially empty sella. Enlarged optic nerve sheaths. Findings are non-specific but can be seen in association with idiopathic intracranial hypertension.No acute findings. No masses or hydrocephalus.   She had a fluoroscopy guided lumbar puncture in June 25 2015, with open pressure 33 cm water, she has developed post LP headaches, eventually had blood patch in June 30 2015, with immediate relief of her headaches  Spinal fluid testing showed total protein 43, glucose 53, WBC 20, RBC 1, 72% lymphocytes, 24% monocytes  She continue has mild blurry vision, occasionally double vision on extreme gaze to the left or right, low-grade bifrontal pressure headaches, she has tried Maxalt as needed, which has been helpful for moderate and severe  headaches. She is tolerating Topamax 100 mg twice a day well  She began to have recurrent headaches since September 2016, 40 pound weight gain over one year,  she contributed to [facility] implant that  was put in 2015, had weight gain, ws taken out in Sep, 2016   repeat LP  in Sep 29 2015, showed Open pressure is 26 cm H2O. she did not get post LP headache this time, but she had needle site low back pain, mild left hip pain since the last lumbar puncture in October 17,  Lumbar puncture January 19 2016, open pressure was 23 cm water, unfortunately she developed post LP headache, failed to improve by Fioricet, eventually had blood patch January 22 2016, she was evaluated by neurosurgeon Dr. Kathyrn Sheriff for potential shunt replacement, is referred to [facility].  MRI scan lumbar spine  10/20/16 showing prominent disc degenerative change at L5-S1 with broad-based disc osteophyte protrusion resulting in severe bilateral foraminal narrowing and likely encroachment on exiting nerve roots.   Update October 13 2017: I reviewed discharge summary on September 4 to 07-2017, she presented to the emergency room complains of acute onset left-sided numbness with associated severe migraine, MRI of the brain showed acute infarction in the right cerebellum, several small acute infarction in the right posterior frontal region, echocardiogram showed ejection fraction 55-60%, normal wall motion, CT angiogram showed patent artery, LDL 89, A1c 4.8, transcranial Doppler study which review PFO, lower extremity venous Doppler study was normal, she was started on Lipitor, and aspirin 81 mg,  TEE on August 22 2017, trivial MR, no thrombosis or mass,  CT angiogram of head and neck no significant abnormalities.  She presented  to the emergency room October 11 2017 for acute  onset right-sided headache, with residual numbness of left face and hand from her previous stroke on August 15 2017, she does not feel good, left leg  feel weak, might gave out on her.   Extensive laboratory evaluation in September 2018, mild elevated ESR 28, negative ANA, cardiolipin antibody, lupus anticoagulant, UDS, Sjogren antibody, anti-DNA antibody, ANCA, rheumatoid factor, C-reactive protein was slightly elevated 8.7  Repeat MRI of the brain October 11 2017, showed no acute abnormality, previous signal abnormality in September 2018 had resolved,  She has has daily headache all over, 9/10, now taking Fioricet twice a day 2 weeks, blurry vision  She is still taking topamax 150mg bid, gabapentin 300/600mg, nortriptyline 50mg qhs.   UPDATE Nov 01 2017: She has been having daily headaches since September 2018, even before that, she was having more than 15 days in a month, each headache lasts more than 4 hours, moderate to severe,  Over the years, she has tried and failed Topamax, nortriptyline, propanolol, gabapentin, Effexor.   She is now taking Cambia as needed with moderate help, She complains of mildly blurry vision.  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  Update March 09, 2018: She received one Botox injection in November 2018, reported mild improvement last for 1 month, now she is having 3-4 migraine headaches each week, cambia helps,  30-day cardiac monitoring showed no significant abnormality in December 2018 Evaluation by ophthalmologist Dr. Grote in February 2019 was normal.  UPDATE Sept 24 2019: She continue complains of almost daily headaches, mild to moderate, but often exacerbated to a more severe headaches, she takes Ambien as needed, she denies significant visual change, recent ophthalmology evaluation in May by Dr. Groat reported normal, most recent lumbar puncture was in February 2017 open pressure was 23 cm water  Resume BOTOX injection as migraine prevention on Sept 24 2019,, she is also on Topamax 100 mg twice a day, Effexor XR 75 mg daily, nortriptyline 50 mg every night gabapentin as migraine  prevention  UPDATE Nov 29 2018: Addendum about her stroke work up with vascular neurologist Dr. 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 TEE was negative and she had h/o TTP hence not a good long term anticoagulation canadidate and given young age and absence of vascular risk factore low probability of finding AFIB. We typically do not do loop in pts below 40 years of age unless there are several vascular risk factors identified. She also had history of birth control pills and migraine which may have contributed to hypercoagulability. So I feel she is cryptogenic stroke in young with negative evaluation.   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 Asymmetry of the transverse sinus systems is a normal variant.  She only report mild improvement from previous Botox injection 3 months ago, continue have 4 headaches each week, respond somewhat to Cambia   UPDATE February 28 2019: She started aimovig since December 2019, she reported mild improvement along with Botox injection every 3 months, but she still has 3 headaches in a week, not taking any abortive treatment, previously Cambia worked very well  She complains of few months history of intermittent sudden onset mild confusion, word finding difficulties, also transient right hand numbness, that was independent of her language difficulty  EEG was ordered is pending  UPDATE May 30 2019: She responded very well to previous injection.  There was no significant side effect noted.  She continue have 4-5 migraine headaches each week, lasting for few hours, Cambia works well most of the time  UPDATE Sept 23 2020: Botox injection in June 2020 was helpful, she has to coming on July 24, 2019 for nerve block, which was helpful, new has frequent headaches, taking Cambia as needed, today also add on ubrevyl 50 mg as  needed  UPDATE Oct 6th 2020: She received Botox injection on September 05, 2019, couple days later, she began to notice left side droopy eyelid, which has been persistent since then, also complains of left retro-orbital area pressure pain, she denies visual loss, denies dysarthria, chewing or swallowing difficulties.  REVIEW OF SYSTEMS: Full 14 system review of systems performed and notable only for those listed, all others are neg: As above   ALLERGIES: Allergies  Allergen Reactions  . Tape Rash    Cannot tolerate paper tape    HOME MEDICATIONS: Outpatient Medications Prior to Visit  Medication Sig Dispense Refill  . acetaminophen (TYLENOL) 325 MG tablet Take 650 mg by mouth every 6 (six) hours as needed.    . ALPRAZolam (XANAX) 1 MG tablet Take 1 tablet by mouth 30 min prior to test. Can take additional tablet at time of test if needed. Must have driver. Can cause drowsiness. 3 tablet 0  . aspirin EC 325 MG tablet Take 1 tablet (325 mg total) by mouth daily. 30 tablet 0  . atorvastatin (LIPITOR) 40 MG tablet Take 1 tablet (40 mg total) by mouth daily. 90 tablet 3  . BOTOX 100 units SOLR injection FOR OFFICE ADMINISTRATION. PHYSICIAN TO INJECT 155 UNITS INTRAMUSCULARLY INTO HEAD AND NECK MUSCLES EVERY 3 MONTHS 2 each 3  . co-enzyme Q-10 50 MG capsule Take 50 mg by mouth 2 (two) times daily.    . Diclofenac Potassium (CAMBIA) 50 MG PACK Take 50 mg by mouth daily as needed. 20 each 11  . Erenumab-aooe (AIMOVIG) 70 MG/ML SOAJ Inject 140 mg into the skin every 30 (thirty) days. 2 pen 11  . gabapentin (NEURONTIN) 300 MG capsule TAKE 1 CAPSULE BY MOUTH TWICE A DAY MAY INCREASE TO 2 CAPS TWICE DAILY IF NEEDED 120 capsule 11  . Magnesium Oxide 400 (240 Mg) MG TABS Take 400 mg by mouth 2 (two) times daily. 60 tablet 11  . meclizine (ANTIVERT) 12.5 MG tablet Take 12.5 mg by mouth 3 (three) times daily as needed for dizziness.    . medroxyPROGESTERone (DEPO-PROVERA) 150 MG/ML injection Inject 150  mg into the muscle every 3 (three) months.    . meloxicam (MOBIC) 15 MG tablet TAKE ONE TABLET DAILY AS NEEDED WITH FOOD 90 tablet 0  . nortriptyline (PAMELOR) 50 MG capsule TAKE 1 CAPSULE BY MOUTH EVERY DAY AT BEDTIME 90 capsule 3  . ondansetron (ZOFRAN-ODT) 4 MG disintegrating tablet TAKE 1 TABLET BY MOUTH EVERY 8 HOURS AS NEEDED FOR NAUSEA AND VOMITING 30 tablet 11  . Riboflavin 100 MG TABS Take 1 tablet (100 mg total) by mouth 2 (two) times daily. 60 tablet 11  . tiZANidine (ZANAFLEX) 4 MG tablet TAKE 1 TABLET BY MOUTH EVERY 6 HOURS AS NEEDED FOR MUSCLE SPASMS. #30/30. 30 tablet 11  . topiramate (TOPAMAX) 100 MG tablet TAKE 1 & 1/2 TABLET (150 MG TOTAL) BY MOUTH 2 TIMES DAILY. 270 tablet 3  . venlafaxine XR (EFFEXOR-XR) 150 MG 24 hr capsule Take 150 mg by mouth daily with  breakfast.     No facility-administered medications prior to visit.     PAST MEDICAL HISTORY: Past Medical History:  Diagnosis Date  . DDD (degenerative disc disease), lumbar   . Headache   . Other disorders of optic nerve, not elsewhere classified, unspecified eye   . Stroke ([place])   . 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 CESAREAN SECTION    . CHOLECYSTECTOMY    . TEE WITHOUT CARDIOVERSION N/A 08/22/2017   Procedure: TRANSESOPHAGEAL ECHOCARDIOGRAM (TEE);  Surgeon: Skeet Latch, MD;  Location: [facility] ENDOSCOPY;  Service: Cardiovascular;  Laterality: N/A;  . WISDOM TOOTH EXTRACTION      FAMILY HISTORY: Family History  Problem Relation Age of Onset  . Diabetes Mother   . Hypertension Mother   . Ovarian cancer Mother        Hysterectomy  . Healthy Father   . Ovarian cancer Maternal Grandmother        Hysterectomy    SOCIAL HISTORY: Social History   Socioeconomic History  . Marital status: Single    Spouse name: Not on file  . Number of children: 1  . Years of education: In college  . Highest education level:  Not on file  Occupational History  . Occupation: Counsellor  Social Needs  . Financial resource strain: Not on file  . Food insecurity    Worry: Not on file    Inability: Not on file  . Transportation needs    Medical: Not on file    Non-medical: Not on file  Tobacco Use  . Smoking status: Former Smoker    Packs/day: 0.50    Quit date: 08/27/2017    Years since quitting: 2.0  . Smokeless tobacco: Never Used  Substance and Sexual Activity  . Alcohol use: No    Alcohol/week: 0.0 standard drinks  . Drug use: No  . Sexual activity: Yes    Birth control/protection: Pill  Lifestyle  . Physical activity    Days per week: Not on file    Minutes per session: Not on file  . Stress: Not on file  Relationships  . Social Herbalist on phone: Not on file    Gets together: Not on file    Attends religious service: Not on file    Active member of club or organization: Not on file    Attends meetings of clubs or organizations: Not on file    Relationship status: Not on file  . Intimate partner violence    Fear of current or ex partner: Not on file    Emotionally abused: Not on file    Physically abused: Not on file    Forced sexual activity: Not on file  Other Topics Concern  . Not on file  Social History Narrative   Lives at home with her mother and daughter.   3-4 cups caffeine daily.   Left-handed.     PHYSICAL EXAM  Vitals:   09/18/19 1304  BP: 138/82  Pulse: 88  Temp: 98 F (36.7 C)  Weight: 233 lb 8 oz (105.9 kg)  Height: _0  (1.651 m)   Body mass index is 38.86 kg/m.    PHYSICAL EXAMNIATION:     NEUROLOGICAL EXAM: Left droopy eyelid, mild bilateral orbicularis oculi weakness, left worse than right, pupils are equal round reactive, extraocular movement was full, facial were symmetric, uvula tongue midline, cheek puff strength was normal.  She has mild left arm and leg weakness  ASSESSMENT AND PLAN  33 y.o. 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 Carolynn Sayers in February 2019 showed no evidence of papillary edema  Chronic migraine headaches,  Status post Botox injection as migraine prevention on September 05, 2019, postinjection she developed mild left ptosis, this is most related to Botox injection.  Will continue to observe her symptoms, call for progress report    Marcial Pacas, M.D. Ph.D.  [facility] [place], [place] [address] Phone: [phone] Fax:      [phone]

## 2019-10-29 ENCOUNTER — Other Ambulatory Visit: Payer: Self-pay | Admitting: Neurology

## 2019-12-12 ENCOUNTER — Ambulatory Visit: Payer: Self-pay | Admitting: Neurology

## 2020-01-24 ENCOUNTER — Other Ambulatory Visit: Payer: Self-pay | Admitting: *Deleted

## 2020-01-24 MED ORDER — ONDANSETRON 4 MG PO TBDP: ORAL_TABLET | ORAL | 5 refills | Status: DC

## 2020-03-10 ENCOUNTER — Other Ambulatory Visit: Payer: Self-pay | Admitting: *Deleted

## 2020-03-10 MED ORDER — AIMOVIG 140 MG/ML ~~LOC~~ SOAJ: 1.0000 mL | SUBCUTANEOUS | 0 refills | Status: DC

## 2020-03-24 ENCOUNTER — Telehealth (INDEPENDENT_AMBULATORY_CARE_PROVIDER_SITE_OTHER): Payer: Medicare Other | Admitting: Neurology

## 2020-03-24 ENCOUNTER — Encounter: Payer: Self-pay | Admitting: Neurology

## 2020-03-24 DIAGNOSIS — G43709 Chronic migraine without aura, not intractable, without status migrainosus: Secondary | ICD-10-CM | POA: Diagnosis not present

## 2020-03-24 DIAGNOSIS — I6339 Cerebral infarction due to thrombosis of other cerebral artery: Secondary | ICD-10-CM | POA: Diagnosis not present

## 2020-03-24 DIAGNOSIS — IMO0002 Reserved for concepts with insufficient information to code with codable children: Secondary | ICD-10-CM

## 2020-03-24 NOTE — Progress Notes (Signed)
Virtual Visit via Video Note  I connected with Krista Maddox on 03/24/20 at  2:15 PM EDT by a video enabled telemedicine application and verified that I am speaking with the correct person using two identifiers.  Location: Patient: at home Provider: in the office   I discussed the limitations of evaluation and management by telemedicine and the availability of in person appointments. The patient expressed understanding and agreed to proceed.  History of Present Illness:   HISTORY OF PRESENT ILLNESS:YYHeather L Matthewsis a 34 years old right-handed female, seen in consultation by his ophthalmologist Dr. Alois Cliche for evaluation of bilateral papillary edema. Initial evaluation was on November 03 2015   I reviewed notes from Dr. Idolina Primer in May 28 2015, patient was seen for complains of blurry vision, especially seen distance, on examination, visual acuity right eye 20/25 minus, left 20/30 plus, bilateral papillary edema  She denied previous history of headache, since May 2016, she began to notice frequent headaches, bilateral retro-orbital area constant pressure, 8 out of 10 on daily basis, sometimes it would exacerbate to a more severe 10 out of 10 pounding headaches, with associated light noise sensitivity, nauseous, bending down or getting up quickly also causing her dizzines. She has been taking up to 12 tablets of ibuprofen each day, which only temporarily helps her headaches,  She received etonogestrel (NEXPLANON) 68 MG IMPL implant in July 14th 2016. She complains of 25 pound weight gain since last year.  MRI brain In June 2016: Enlarged partially empty sella. Enlarged optic nerve sheaths. Findings are non-specific but can be seen in association with idiopathic intracranial hypertension.No acute findings. No masses or hydrocephalus.   She had a fluoroscopy guided lumbar puncture in June 25 2015, with open pressure 33 cm water, she has developed post LP headaches,  eventually had blood patch in June 30 2015, with immediate relief of her headaches  Spinal fluid testing showed total protein 43, glucose 53, WBC 20, RBC 1, 72% lymphocytes, 24% monocytes  She continue has mild blurry vision, occasionally double vision on extreme gaze to the left or right, low-grade bifrontal pressure headaches, she has tried Maxalt as needed, which has been helpful for moderate and severe headaches. She is tolerating Topamax 100 mg twice a day well  She began to have recurrent headaches since September 2016, 40 pound weight gain over one year,  she contributed to [facility] implant that  was put in 2015, had weight gain, ws taken out in Sep, 2016   repeat LP in Sep 29 2015, showed Open pressure is 26 cm H2O. she did not get post LP headache this time, but she had needle site low back pain, mild left hip pain since the last lumbar puncture in October 17,  Lumbar puncture January 19 2016, open pressure was 23 cm water, unfortunately she developed post LP headache, failed to improve by Fioricet, eventually had blood patch January 22 2016, she was evaluated by neurosurgeon Dr. Kathyrn Sheriff for potential shunt replacement, is referred to [facility].  MRI scan lumbar spine  10/20/16 showing prominent disc degenerative change at L5-S1 with broad-based disc osteophyte protrusion resulting in severe bilateral foraminal narrowing and likely encroachment on exiting nerve roots.   Update October 13 2017: I reviewed discharge summary on September 4 to 07-2017, she presented to the emergency room complains of acute onset left-sided numbness with associated severe migraine, MRI of the brain showed acute infarction in the right cerebellum, several small acute infarction in the right  posterior frontal region, echocardiogram showed ejection fraction 55-60%, normal wall motion, CT angiogram showed patent artery, LDL 89, A1c 4.8, transcranial Doppler study which review PFO, lower extremity  venous Doppler study was normal, she was started on Lipitor, and aspirin 81 mg,  TEE on August 22 2017, trivial MR, no thrombosis or mass,  CT angiogram of head and neck no significant abnormalities.  She presented to the emergency room October 11 2017 for acute  onset right-sided headache, with residual numbness of left face and hand from her previous stroke on August 15 2017, she does not feel good, left leg feel weak, might gave out on her.   Extensive laboratory evaluation in September 2018, mild elevated ESR 28, negative ANA, cardiolipin antibody, lupus anticoagulant, UDS, Sjogren antibody, anti-DNA antibody, ANCA, rheumatoid factor, C-reactive protein was slightly elevated 8.7  Repeat MRI of the brain October 11 2017, showed no acute abnormality, previous signal abnormality in September 2018 had resolved,  She has has daily headache all over, 9/10, now taking Fioricet twice a day 2 weeks, blurry vision  She is still taking topamax 139m bid, gabapentin 300/6046m nortriptyline 5052mhs.   UPDATE Nov 01 2017: She has been having daily headaches since 34 September 2018, even before that, she was having more than 15 days in a month, each headache lasts more than 4 hours, moderate to severe,  Over the years, she has tried and failed Topamax, nortriptyline, propanolol, gabapentin, Effexor.   She is now taking Cambia as needed with moderate help, She complains of mildly blurry vision.  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  Update March 09, 2018: She received one Botox injection in November 2018, reported mild improvement last for 1 month, now she is having 3-4 migraine headaches each week, cambia helps,  30-day cardiac monitoring showed no significant abnormality in December 2018 Evaluation by ophthalmologist Dr. GroCarolynn Sayers February 2019 was normal.  UPDATE Sept 24 2019: She continue complains of almost daily headaches, mild to moderate, but often exacerbated  to a more severe headaches, she takes Ambien as needed, she denies significant visual change, recent ophthalmology evaluation in May by Dr. GroKaty Fitchported normal, most recent lumbar puncture was in February 2017 open pressure was 23 cm water  Resume BOTOX injection as migraine prevention on Sept 24 2019,, she is also on Topamax 100 mg twice a day, Effexor XR 75 mg daily, nortriptyline 50 mg every night gabapentin as migraine prevention  UPDATE Nov 29 2018: Addendum about her stroke work up with vascular neurologist Dr. 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 TEE was negative and she had h/o TTP hence not a good long term anticoagulation canadidate and given young age and absence of vascular risk factore low probability of finding AFIB. We typically do not do loop in pts below 40 31ars of age unless there are several vascular risk factors identified. She also had history of birth control pills and migraine which may have contributed to hypercoagulability. So I feel she is cryptogenic stroke in young with negative evaluation.   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 Asymmetry of the transverse sinus systems is a normal variant.  She only report mild improvement from previous Botox injection 3 months ago, continue have 4 headaches each week, respond somewhat to CamGirardrch 18 2020: She started aimovig since December 2019,  she reported mild improvement along with Botox injection every 3 months, but she still has 3 headaches in a week, not taking any abortive treatment, previously Vandemere worked very well  She complains of few months history of intermittent sudden onset mild confusion, word finding difficulties, also transient right hand numbness, that was independent of her language difficulty  EEG was ordered is pending  UPDATE May 30 2019: She responded very well to previous injection.  There was no significant side effect noted.  She continue have 4-5 migraine headaches each week, lasting for few hours, Cambia works well most of the time  UPDATE Sept 23 2020: Botox injection in June 2020 was helpful, she has to coming on July 24, 2019 for nerve block, which was helpful, new has frequent headaches, taking Cambia as needed, today also add on ubrevyl 50 mg as needed  UPDATE Oct 6th 2020: She received Botox injection on September 05, 2019, couple days later, she began to notice left side droopy eyelid, which has been persistent since then, also complains of left retro-orbital area pressure pain, she denies visual loss, denies dysarthria, chewing or swallowing difficulties.  Update March 24, 2020 SS: Here today for follow-up via virtual visit.  She is also seeing Dr. Ninfa Meeker at [facility] neurology, who is primarily managing her migraines.  She had LP November 2020, opening pressure was 19.  She is currently taking atenolol, Aimovig, Botox, gabapentin, nortriptyline, Topamax, and Effexor. Doesn't think she can continue Botox due to insurance. She is taking Ubrelvy and Phenergan if needed.  She is also taking magnesium and riboflavin.   Still having headaches, has CPAP, not using currently waiting to send new nose piece.  Saw Dr. Hassell Done, neuro-ophthalmology at [facility], February 26, 2020, no optic disc swelling.  She continues to have frequent, near daily headache.  She remains on aspirin for history of embolic stroke, no further events.  Observations/Objective: Is alert and oriented, speech is clear and concise, left facial asymmetry to mouth with smiling, no arm drift, gait appears intact, follows commands  Assessment and Plan: 1.  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  Possible  pseudotumor cerebri -Ophthalmology evaluation by Dr. Katy Fitch in February 2019 showed no evidence of papillary edema -LP in November 2020 opening pressure was 71 -Saw Dr. 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  Chronic migraine headaches -Currently being managed by Dr. Ninfa Meeker at [facility] neurology, is receiving Botox injections, is taking Topamax, gabapentin, amitriptyline, Aimovig, Effexor, atenolol, magnesium, riboflavin.  She is taking Ubrelvy, Phenergan as needed -She will let me know if she needs refills, however felt Mina Marble would be managing medications going forward  Follow Up Instructions: At our office on an as-needed basis, will continue to see PCP for history of stroke, continue to see [facility] neurology for management of migraines   I discussed the assessment and treatment plan with the patient. The patient was provided an opportunity to ask questions and all were answered. The patient agreed with the plan and demonstrated an understanding of the instructions.   The patient was advised to call back or seek an in-person evaluation if the symptoms worsen or if the condition fails to improve as anticipated.  I spent 30 minutes of face-to-face and non-face-to-face time with patient.  This included previsit  chart review, lab review, study review, order entry, electronic health record documentation, patient education.   Evangeline Dakin, DNP  [facility] [address], [place], [place] [address] [phone]

## 2020-04-07 ENCOUNTER — Other Ambulatory Visit: Payer: Self-pay

## 2020-04-07 ENCOUNTER — Other Ambulatory Visit: Payer: Self-pay | Admitting: Neurology

## 2020-04-08 ENCOUNTER — Other Ambulatory Visit: Payer: Self-pay | Admitting: Neurology

## 2020-04-09 ENCOUNTER — Other Ambulatory Visit: Payer: Self-pay | Admitting: *Deleted

## 2020-04-09 ENCOUNTER — Telehealth: Payer: Self-pay | Admitting: *Deleted

## 2020-04-09 MED ORDER — NORTRIPTYLINE HCL 50 MG PO CAPS: ORAL_CAPSULE | ORAL | 0 refills | Status: DC

## 2020-04-09 MED ORDER — AIMOVIG 140 MG/ML ~~LOC~~ SOAJ: 1.0000 mL | SUBCUTANEOUS | 0 refills | Status: DC

## 2020-04-09 NOTE — Telephone Encounter (Signed)
I was unable to complete on covermymeds. I had to call [employer] to initiate the PA over the phone [phone]). Pt ID: [id number]. They have not assigned a PA number but the agent stated to use her name, Orie Fisherman, and today's date for the call reference.

## 2020-04-09 NOTE — Telephone Encounter (Signed)
I called the patient. She indicated at her last appt that she would be discussing further refills with her neurologist at [facility]. However, she is not able to do this until her next appt in June 2021. She needs refills of Aimovig and nortriptyline to last her until this time. They have been to the pharmacy.

## 2020-04-15 NOTE — Telephone Encounter (Signed)
PA approved by [employer] [phone]). Valid from 02/11/2020 through 04/15/21. [id number]. Case [id number].

## 2020-04-22 NOTE — Progress Notes (Signed)
I have reviewed and agreed above plan. 

## 2020-06-03 ENCOUNTER — Telehealth: Payer: Self-pay | Admitting: Neurology

## 2020-06-03 NOTE — Telephone Encounter (Signed)
I placed the vials in the office sample stock because patient has not had Botox injection since 9/23.

## 2020-06-03 NOTE — Telephone Encounter (Signed)
Per last note:  Follow Up Instructions: At our office on an as-needed basis, will continue to see PCP for history of stroke, continue to see [facility] neurology for management of migraines  No further appts pending.  Per patient consent form:  I have given permission for my insurance company and specialty pharmacy to be contacted. I understand that if my medication is left in the office unclaimed for 6 months after my last scheduled injection, the medication will become property of [facility].

## 2020-06-03 NOTE — Telephone Encounter (Signed)
Patient has (2) 100U vials of Botox in the fridge from 11/2019. Does patient plan to continue injections? If not, what can we do with the unused vials?

## 2020-06-22 ENCOUNTER — Other Ambulatory Visit: Payer: Self-pay | Admitting: Neurology

## 2020-07-16 ENCOUNTER — Ambulatory Visit: Payer: Medicare Other | Admitting: Family Medicine

## 2020-07-22 ENCOUNTER — Other Ambulatory Visit: Payer: Self-pay | Admitting: Neurology

## 2020-07-24 ENCOUNTER — Other Ambulatory Visit: Payer: Self-pay | Admitting: Neurology

## 2020-12-19 ENCOUNTER — Other Ambulatory Visit: Payer: Self-pay

## 2020-12-19 ENCOUNTER — Ambulatory Visit: Payer: Medicare Other | Attending: Family Medicine

## 2020-12-19 DIAGNOSIS — R4701 Aphasia: Secondary | ICD-10-CM | POA: Insufficient documentation

## 2020-12-19 NOTE — Patient Instructions (Signed)
   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 It is a problem with your brain for you to talk.  It will get better.  Write things down, show people things - be creative to get your point across.  [email or web address].aphasia.com    -- website with activities for people with aphasia   Also an app in the app store  - "talk path therapy"

## 2020-12-20 NOTE — Therapy (Signed)
[facility] [address] [place], [place], [address] Phone: [phone]   Fax:  [phone]  Speech Language Pathology Evaluation  Patient Details  Name: Krista Maddox MRN: [id number] Date of Birth: September 13, 1986 Referring Provider (SLP): Delbert Harness, MD   Encounter Date: 12/19/2020   End of Session - 12/19/20 1835    Visit Number 1    Number of Visits 19    Date for SLP Re-Evaluation 03/19/21   90 days   Authorization Type medicaid    SLP Start Time 1021    SLP Stop Time  1101    SLP Time Calculation (min) 40 min    Activity Tolerance Patient tolerated treatment well;Other (comment)   mildly limited by tearfulness          Past Medical History:  Diagnosis Date  . DDD (degenerative disc disease), lumbar   . Headache   . Other disorders of optic nerve, not elsewhere classified, unspecified eye   . Stroke (HCC)   . TTP (thrombotic thrombocytopenic purpura) (HCC)     Past Surgical History:  Procedure Laterality Date  . CESAREAN SECTION    . CHOLECYSTECTOMY    . TEE WITHOUT CARDIOVERSION N/A 08/22/2017   Procedure: TRANSESOPHAGEAL ECHOCARDIOGRAM (TEE);  Surgeon: Chilton Si, MD;  Location: [facility] ENDOSCOPY;  Service: Cardiovascular;  Laterality: N/A;  . WISDOM TOOTH EXTRACTION      There were no vitals filed for this visit.   Subjective Assessment - 12/19/20 1813    Subjective "My talk- my talk- my -my words are jubbled (jumbled)."    Currently in Pain? No/denies              SLP Evaluation [facility] - 12/19/20 1814      SLP Visit Information   SLP Received On 12/19/20    Referring Provider (SLP) Delbert Harness, MD    Onset Date 11-09-20    Medical Diagnosis Lt parietal CVA      Subjective   Subjective Pt relates she is very frustrated with her decr in language function. Has had to have her 39 y/o daughter speak on the phone for her numerous times since CVA in November.      General Information    HPI Pt with CVA in September 2018 in rt cerebellum and several small acute CVAs were also ID'd at that time in posterior frontal region. Pt had WNL speech-language eval at that time in hospital. Taht CVA impacted pt's writing hand mostly with some residual effects to present day. Current CVA on 11-09-20 CT showed likely embolic ACA infarct in the anteriolateral lt parietal lobe. Pertinent PMH of migraines.    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 She reports in church she does not understand the sermons.      Reading Comprehension   Reading Status  Impaired   needs to be assessed as pt states "can't read scriptures and it's not good"     Verbal Expression   Overall Verbal Expression Impaired    Level of Generative/Spontaneous Verbalization Sentence    Effective Techniques Written cues   slower rate; pt self cued with writing today, successfully, with SLP encouragement to write   Non-Verbal Means of Communication Writing    Other Verbal Expression Comments Pt's expressive verbal language will be formally tested  next session. Pt teary and emotional today and testing likely would not have been valid given this. Pt made semantic errors and neologistic errors (e.g., "virgus"/virtual, "nervum"/numbers) with good (not excellent) awareness of errors. 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 Pt's simple verbal messages were not functional without mod SLP questioning cues and sometimes pt writing word approximations to augment her verbal output.      Written Expression   Written Expression Exceptions to [facility]    Self Formulation Ability Word   as described above - intermittent WNL word production but pt approximated written words well enough to varying degrees to augment her spoken language     Motor Speech   Overall Motor Speech Appears within functional limits for tasks assessed                           SLP Education - 12/19/20 1834    Education Details talk path, constant therapy, reassured pt langauge will improve it will take time, young age 35 means better prognosis than someone >50, compensate with writing, very basics of what aphasia is    Person(s) Educated Patient    Methods Explanation;Demonstration;Handout    Comprehension Verbalized understanding;Need further instruction            SLP Short Term Goals - 12/19/20 2343      SLP SHORT TERM GOAL #1   Title Complete formal aphasia assessment    Baseline not completed    Time 1    Period Weeks   or 3 total sessions   Status New      SLP SHORT TERM GOAL #2   Title pt will produce sentence responses with compensations 80% success    Baseline 50% with mod A    Time 4    Period Weeks   or 9 total sesions, for all STGs   Status New      SLP SHORT TERM GOAL #3   Title pt language will improve in order to have pt report communicative effectiveness score to >/= 15    Baseline 13    Time 4    Period Weeks    Status New      SLP SHORT TERM GOAL #4   Title pt will report ability to participate in simple telephone conversations with strangers such as Teacher, early years/pre or other service reps or medical personnel with extra time and compensations    Baseline daughter has to do so for patient    Time 4    Period Weeks    Status New             SLP Long Term Goals - 12/19/20 2349      SLP LONG TERM GOAL #1   Title pt language will improve in order to have pt report communicative effectiveness score to >/= 20    Baseline 13    Time 18    Period --   visits (19  total), for all LTGs   Status New      SLP LONG TERM GOAL #2   Title pt will produce functional communication to communicate simple messages salient for pt in 3 sessions with modified independence (compensations)    Baseline simple messages are non-functional    Time 18    Status New      SLP LONG TERM GOAL #3   Title pt will demonstrate understanding of longer auditory messages with modified independence (compensations) such as sermons    Baseline does not understand sermons    Time 12    Status New            Plan - 12/19/20 1837    Clinical Impression Statement Heahter Mettler presents today with what appears to be 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 She is unable to assist her daughter with homework at this time, and is also unable to hold conversations about medical insurance, banking, and other topics and has had to have her 46 year old daughter talk for her at times. She expressed frustration and disappointment at her current ability to communicate. Her Communication Effectiveness Score was 13. Of 8 responses, 4 were 1's ("not at all effective"), and pt only had one more response a 3. She endorses she is able to understand most auditory messages but Sui expresses frustration with inability to understand longer auditory information like the sermons at her church. Given pt's reduced abilities to verbally communicate and to understand longer auditory stimuli versus her communication demands, speech therapy at this time is highly recommended (essential, maybe) for pt to regain abilities nearer her PLOF. Further evaluation of her languge skills will be completed upon first therapy session.     Speech Therapy Frequency 2x / week   x6 weeks, then x1 for 6 weeks   Duration --   (see above)   Treatment/Interventions Environmental controls;Functional tasks;Compensatory techniques;Multimodal communcation approach;SLP instruction and feedback;Cueing hierarchy;Language facilitation;Internal/external aids;Patient/family education    Potential to Achieve Goals Good    SLP Home Exercise Plan talk path therapy    Consulted and Agree with Plan of Care Patient           Patient will benefit from skilled therapeutic intervention in order to improve the following deficits and impairments:   Aphasia    Problem List Patient Active Problem List   Diagnosis Date Noted  . Ptosis of left eyelid 09/18/2019  . Speech disturbance 02/28/2019  . Paresthesia 02/28/2019  . Cerebrovascular accident (HCC) 11/02/2017  . 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 History of TTP (thrombotic thrombocytopenic purpura)   . 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 Numbness on left side 08/16/2017  . Acute left lumbar radiculopathy 10/13/2016  . Chronic migraine 06/01/2016  . Pseudotumor cerebri 11/03/2015  . DENTAL PAIN 06/09/2010  . HEADACHE, TENSION 02/04/2010  . SCABIES 01/13/2010  . FOOT PAIN, LEFT 01/13/2010  . CERUMEN IMPACTION, BILATERAL 11/24/2009  . PSORIASIS 11/24/2009  . Smoker 10/15/2009  . 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 PYELONEPHRITIS 10/01/2009  . 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 [place], [place], [address] Phone: [phone]   Fax:  [phone]  Name: TEAUNA DUBACH MRN: [id number] Date of Birth: 05-27-1986

## 2020-12-29 ENCOUNTER — Ambulatory Visit: Payer: Medicare Other

## 2020-12-31 ENCOUNTER — Ambulatory Visit: Payer: Medicare Other | Admitting: Speech Pathology

## 2021-01-02 ENCOUNTER — Ambulatory Visit: Payer: Medicare Other

## 2021-01-02 ENCOUNTER — Other Ambulatory Visit: Payer: Self-pay

## 2021-01-02 DIAGNOSIS — R4701 Aphasia: Secondary | ICD-10-CM | POA: Diagnosis not present

## 2021-01-02 NOTE — Therapy (Signed)
[facility] [address] [place], [place], [address] Phone: [phone]   Fax:  [phone]  Speech Language Pathology Treatment  Patient Details  Name: Krista Maddox MRN: [id number] Date of Birth: September 03, 1986 Referring Provider (SLP): Delbert Harness, MD   Encounter Date: 01/02/2021   End of Session - 01/02/21 1558    Visit Number 2    Number of Visits 19    Date for SLP Re-Evaluation 03/19/21   90 days   Authorization Type medicaid    SLP Start Time 1107    SLP Stop Time  1148    SLP Time Calculation (min) 41 min    Activity Tolerance Patient tolerated treatment well;Other (comment)   mildly limited by tearfulness          Past Medical History:  Diagnosis Date  . DDD (degenerative disc disease), lumbar   . Headache   . Other disorders of optic nerve, not elsewhere classified, unspecified eye   . Stroke (HCC)   . TTP (thrombotic thrombocytopenic purpura) (HCC)     Past Surgical History:  Procedure Laterality Date  . CESAREAN SECTION    . CHOLECYSTECTOMY    . TEE WITHOUT CARDIOVERSION N/A 08/22/2017   Procedure: TRANSESOPHAGEAL ECHOCARDIOGRAM (TEE);  Surgeon: Chilton Si, MD;  Location: [facility] ENDOSCOPY;  Service: Cardiovascular;  Laterality: N/A;  . WISDOM TOOTH EXTRACTION      There were no vitals filed for this visit.   Subjective Assessment - 01/02/21 1556    Subjective "I got it but I - I - Amber needs- the - - the computer now for - school." (re: Talk Path)    Currently in Pain? No/denies                 ADULT SLP TREATMENT - 01/02/21 1557      General Information   Behavior/Cognition Alert;Cooperative;Pleasant mood      Treatment Provided   Treatment provided Cognitive-Linquistic      Cognitive-Linquistic Treatment   Treatment focused on Aphasia    Skilled Treatment SLP initiated the Western Aphasia Battery Revised today and will complete next session. Pt may require reading  and writing supplements as well.      Assessment / Recommendations / Plan   Plan Continue with current plan of care      Progression Toward Goals   Progression toward goals Progressing toward goals              SLP Short Term Goals - 01/02/21 1600      SLP SHORT TERM GOAL #1   Title Complete formal aphasia assessment    Baseline not completed    Time 1    Period Weeks   or 3 total sessions   Status On-going      SLP SHORT TERM GOAL #2   Title pt will produce sentence responses with compensations 80% success    Baseline 50% with mod A    Time 4    Period Weeks   or 9 total sesions, for all STGs   Status On-going      SLP SHORT TERM GOAL #3   Title pt language will improve in order to have pt report communicative effectiveness score to >/= 15    Baseline 13    Time 4    Period Weeks    Status On-going      SLP SHORT TERM GOAL #4   Title pt will report ability to participate in simple telephone conversations with  strangers such as Teacher, early years/pre or other service reps or medical personnel with extra time and compensations    Baseline daughter has to do so for patient    Time 4    Period Weeks    Status On-going            SLP Long Term Goals - 01/02/21 1600      SLP LONG TERM GOAL #1   Title pt language will improve in order to have pt report communicative effectiveness score to >/= 20    Baseline 13    Time 18    Period --   visits (19 total), for all LTGs   Status On-going      SLP LONG TERM GOAL #2   Title pt will produce functional communication to communicate simple messages salient for pt in 3 sessions with modified independence (compensations)    Baseline simple messages are non-functional    Time 18    Status On-going      SLP LONG TERM GOAL #3   Title pt will demonstrate understanding of longer auditory messages with modified independence (compensations) such as sermons    Baseline does not understand sermons    Time 18    Status  On-going            Plan - 01/02/21 1559    Clinical Impression Statement Heahter continues to present today with what appears to be 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 SLP begain formal aphasia assessment today (WAB-R). She is unable to assist her daughter with homework at this time, and is also unable to hold conversations about medical insurance, banking, and other topics and has had to have her 35 year old daughter talk for her at times. She expressed frustration and disappointment at her current ability to communicate. Her Communication Effectiveness Score was 13. Of 8 responses, 4 were 1's ("not at all effective"), and pt only had one more response a 3. She endorses she is able to understand most auditory messages but Amarylis expresses frustration with inability to understand longer auditory information like the sermons at her church. Given pt's reduced abilities to verbally communicate and to understand longer auditory stimuli versus her communication demands, speech therapy at this time is highly recommended (essential, maybe) for pt to regain abilities nearer her PLOF.    Speech Therapy Frequency 2x / week   x6 weeks, then x1 for 6 weeks   Duration --   (see above)   Treatment/Interventions Environmental controls;Functional tasks;Compensatory techniques;Multimodal communcation approach;SLP instruction and feedback;Cueing hierarchy;Language facilitation;Internal/external aids;Patient/family education    Potential to Achieve Goals Good    SLP Home Exercise Plan talk path therapy    Consulted and Agree with Plan of Care Patient           Patient will benefit from skilled therapeutic intervention in order to improve the following deficits and impairments:   Aphasia    Problem List Patient Active Problem List   Diagnosis Date Noted  . Ptosis of left eyelid 09/18/2019  . Speech disturbance 02/28/2019  . Paresthesia  02/28/2019  . Cerebrovascular accident (HCC) 11/02/2017  . 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 History of TTP (thrombotic thrombocytopenic purpura)   . 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 Numbness on left side 08/16/2017  . Acute left lumbar radiculopathy 10/13/2016  . Chronic migraine 06/01/2016  . Pseudotumor cerebri 11/03/2015  . DENTAL PAIN 06/09/2010  .  HEADACHE, TENSION 02/04/2010  . SCABIES 01/13/2010  . FOOT PAIN, LEFT 01/13/2010  . CERUMEN IMPACTION, BILATERAL 11/24/2009  . PSORIASIS 11/24/2009  . Smoker 10/15/2009  . 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 PYELONEPHRITIS 10/01/2009  . THROMBOTIC THROMBOCYTOPENIC PURPURA 12/13/2005    [facility] ,MS, CCC-SLP  01/02/2021, 4:01 PM   [facility] [address] [place], [place], [address] Phone: [phone]   Fax:  [phone]   Name: MAYU RONK MRN: [id number] Date of Birth: Mar 14, 1986

## 2021-01-05 ENCOUNTER — Encounter: Payer: Self-pay | Admitting: Speech Pathology

## 2021-01-05 ENCOUNTER — Ambulatory Visit: Payer: Medicare Other | Admitting: Speech Pathology

## 2021-01-05 ENCOUNTER — Other Ambulatory Visit: Payer: Self-pay

## 2021-01-05 DIAGNOSIS — R4701 Aphasia: Secondary | ICD-10-CM

## 2021-01-05 NOTE — Patient Instructions (Addendum)
   Great job writing down some things you want to say before a phone call or meeting or doctor appointment   Call [facility] 9-1-1 [phone] and let them know you have aphasia and may not be able to communicate easily in an emergency  Cut out and laminate if you want  Talk Path app

## 2021-01-05 NOTE — Therapy (Signed)
[facility] [address] [place], [place], [address] Phone: [phone]   Fax:  [phone]  Speech Language Pathology Treatment  Patient Details  Name: Krista Maddox MRN: [id number] Date of Birth: Nov 30, 1986 Referring Provider (SLP): Delbert Harness, MD   Encounter Date: 01/05/2021   End of Session - 01/05/21 1500    Visit Number 3    Number of Visits 19    Authorization Type medicaid    SLP Start Time 1103    SLP Stop Time  1146    SLP Time Calculation (min) 43 min    Activity Tolerance Patient tolerated treatment well           Past Medical History:  Diagnosis Date  . DDD (degenerative disc disease), lumbar   . Headache   . Other disorders of optic nerve, not elsewhere classified, unspecified eye   . Stroke (HCC)   . TTP (thrombotic thrombocytopenic purpura)     Past Surgical History:  Procedure Laterality Date  . CESAREAN SECTION    . CHOLECYSTECTOMY    . TEE WITHOUT CARDIOVERSION N/A 08/22/2017   Procedure: TRANSESOPHAGEAL ECHOCARDIOGRAM (TEE);  Surgeon: Chilton Si, MD;  Location: [facility] ENDOSCOPY;  Service: Cardiovascular;  Laterality: N/A;  . WISDOM TOOTH EXTRACTION      There were no vitals filed for this visit.   Subjective Assessment - 01/05/21 1449    Subjective "It's going"    Currently in Pain? No/denies                 ADULT SLP TREATMENT - 01/05/21 1110      General Information   Behavior/Cognition Alert;Cooperative;Pleasant mood      Treatment Provided   Treatment provided Cognitive-Linquistic      Cognitive-Linquistic Treatment   Treatment focused on Aphasia;Patient/family/caregiver education    Skilled Treatment Completed the WAB. Aphasia Quotient (AQ) is 70/2/100 - moderate non fluent (Broca's) aphasia Targeted word finding naming words relevant to her daughters activities and interests.  With multimodal communication of writing she generated 8 words. Written  expression is greater than spoken at word level due to verbal apraxia. Trained Research scientist (physical sciences) in strategy of writing down key words or short phrases/questions she wants to say at meetings, phone calls and medical appointments., She reports she is doing this with some success. She writes the words and keeps the paper in her purse. Daughter has returned to school and [name] is to practice on the Talk Path app. Provided aphasia ID cards and phone number to [facility] 9-1-1 to alert emergency services to her aphasia      Assessment / Recommendations / Plan   Plan Continue with current plan of care      Progression Toward Goals   Progression toward goals Progressing toward goals            SLP Education - 01/05/21 1454    Education Details write down key words/topics you want to talk about in meetings, calls and MD appointments    Person(s) Educated Patient    Methods Explanation;Demonstration;Handout    Comprehension Verbalized understanding;Verbal cues required;Need further instruction            SLP Short Term Goals - 01/05/21 1458      SLP SHORT TERM GOAL #1   Title Complete formal aphasia assessment    Baseline AQ 70.2/100 - moderate aphasia    Time 1    Period Weeks   or 3 total sessions   Status Achieved  SLP SHORT TERM GOAL #2   Title pt will produce sentence responses with compensations 80% success    Baseline 50% with mod A    Time 3    Period Weeks   or 9 total sesions, for all STGs   Status On-going      SLP SHORT TERM GOAL #3   Title pt language will improve in order to have pt report communicative effectiveness score to >/= 15    Baseline 13    Time 3    Period Weeks    Status On-going      SLP SHORT TERM GOAL #4   Title pt will report ability to participate in simple telephone conversations with strangers such as Teacher, early years/pre or other service reps or medical personnel with extra time and compensations    Baseline daughter has to do so for  patient    Time 3    Period Weeks    Status On-going            SLP Long Term Goals - 01/05/21 1459      SLP LONG TERM GOAL #1   Title pt language will improve in order to have pt report communicative effectiveness score to >/= 20    Baseline 13    Time 17    Period --   visits (19 total), for all LTGs   Status On-going      SLP LONG TERM GOAL #2   Title pt will produce functional communication to communicate simple messages salient for pt in 3 sessions with modified independence (compensations)    Baseline simple messages are non-functional    Time 17    Status On-going      SLP LONG TERM GOAL #3   Title pt will demonstrate understanding of longer auditory messages with modified independence (compensations) such as sermons    Baseline does not understand sermons    Time 17    Status On-going            Plan - 01/05/21 1455    Clinical Impression Statement 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 Written expression at word level is relative strength, initiated training in using written word to augment speech and pre-write key words prior to business and medical appointments to aid communication. Ongoing speech therapy recommended to maximize communication for safety, independnece, her daughter's safety and return to PLOF    Speech Therapy Frequency 2x / week   2x a week for 6 weeks, then 1x a week for 6 weeks   Treatment/Interventions Environmental controls;Functional tasks;Compensatory techniques;Multimodal communcation approach;SLP instruction and feedback;Cueing hierarchy;Language facilitation;Internal/external aids;Patient/family education    Potential to Achieve Goals Good    SLP Home Exercise Plan talk path therapy           Patient will benefit from skilled therapeutic intervention in order to improve the following deficits and impairments:   Aphasia    Problem List Patient Active Problem List   Diagnosis Date Noted  . Ptosis of left eyelid  09/18/2019  . Speech disturbance 02/28/2019  . Paresthesia 02/28/2019  . Cerebrovascular accident (HCC) 11/02/2017  . 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 History of TTP (thrombotic thrombocytopenic purpura)   . 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 Numbness on left side 08/16/2017  . Acute left lumbar radiculopathy 10/13/2016  . Chronic migraine 06/01/2016  . Pseudotumor cerebri 11/03/2015  . DENTAL PAIN 06/09/2010  . HEADACHE, TENSION 02/04/2010  .  SCABIES 01/13/2010  . FOOT PAIN, LEFT 01/13/2010  . CERUMEN IMPACTION, BILATERAL 11/24/2009  . PSORIASIS 11/24/2009  . Smoker 10/15/2009  . 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 PYELONEPHRITIS 10/01/2009  . THROMBOTIC THROMBOCYTOPENIC PURPURA 12/13/2005    Jamee Pacholski, Radene Journey MS, CCC-SLP 01/05/2021, 3:01 PM  [facility] [address] [place], [place], [address] Phone: [phone]   Fax:  [phone]   Name: Krista Maddox MRN: [id number] Date of Birth: Sep 16, 1986

## 2021-01-07 ENCOUNTER — Encounter: Payer: Medicaid Other | Admitting: Speech Pathology

## 2021-01-12 ENCOUNTER — Ambulatory Visit: Payer: Medicare Other | Admitting: Speech Pathology

## 2021-01-12 ENCOUNTER — Encounter: Payer: Self-pay | Admitting: Speech Pathology

## 2021-01-12 ENCOUNTER — Other Ambulatory Visit: Payer: Self-pay

## 2021-01-12 DIAGNOSIS — R4701 Aphasia: Secondary | ICD-10-CM

## 2021-01-12 NOTE — Patient Instructions (Signed)
   Take some rests during the day to reduce brain fog and help your speech and language   Have more complex or difficult conversations when you have   Feel free to write down things you want to say over the phone   Great job writing down your questions before doctors appointments  Consider protein shakes or smoothies  Anxiety does make talking harder - having something written down can take the pressure off   Aphasia [facility] - virtual connections   [employer] on line groups  Talk Path on phone

## 2021-01-12 NOTE — Therapy (Signed)
[facility] [address] [place], [place], [address] Phone: [phone]   Fax:  [phone]  Speech Language Pathology Treatment  Patient Details  Name: Krista Maddox MRN: [id number] Date of Birth: Jul 27, 1986 Referring Provider (SLP): Delbert Harness, MD   Encounter Date: 01/12/2021   End of Session - 01/12/21 1201    Visit Number 4    Number of Visits 19    Date for SLP Re-Evaluation 03/19/21    Activity Tolerance Patient tolerated treatment well           Past Medical History:  Diagnosis Date  . DDD (degenerative disc disease), lumbar   . Headache   . Other disorders of optic nerve, not elsewhere classified, unspecified eye   . Stroke (HCC)   . TTP (thrombotic thrombocytopenic purpura)     Past Surgical History:  Procedure Laterality Date  . CESAREAN SECTION    . CHOLECYSTECTOMY    . TEE WITHOUT CARDIOVERSION N/A 08/22/2017   Procedure: TRANSESOPHAGEAL ECHOCARDIOGRAM (TEE);  Surgeon: Chilton Si, MD;  Location: [facility] ENDOSCOPY;  Service: Cardiovascular;  Laterality: N/A;  . WISDOM TOOTH EXTRACTION      There were no vitals filed for this visit.          ADULT SLP TREATMENT - 01/12/21 1149      General Information   Behavior/Cognition Alert;Cooperative;Pleasant mood      Treatment Provided   Treatment provided Cognitive-Linquistic      Cognitive-Linquistic Treatment   Treatment focused on Aphasia;Patient/family/caregiver education    Skilled Naval architect in use of written cues (pre-scripted) to leave a voice mail for aphasia group, practicing 1x before calling. Using this strategy, Krista Maddox successfully left voice mail with occasional min A with phone number. She is writing down questions for her doctor before the appointment. Krista Maddox has increase in mean length utterance (MLU) today, describing her stroke and symptoms with occasional min to mod verbal cues.      Assessment /  Recommendations / Plan   Plan Continue with current plan of care      Progression Toward Goals   Progression toward goals Progressing toward goals            SLP Education - 01/12/21 1155    Education Details energy conservation; compensations for aphasia    Person(s) Educated Patient    Methods Explanation;Verbal cues;Handout    Comprehension Verbalized understanding;Returned demonstration;Verbal cues required;Need further instruction            SLP Short Term Goals - 01/12/21 1159      SLP SHORT TERM GOAL #1   Title Complete formal aphasia assessment    Baseline AQ 70.2/100 - moderate aphasia    Time 1    Period Weeks   or 3 total sessions   Status Achieved      SLP SHORT TERM GOAL #2   Title pt will produce sentence responses with compensations 80% success    Baseline 50% with mod A    Time 2    Period Weeks   or 9 total sesions, for all STGs   Status On-going      SLP SHORT TERM GOAL #3   Title pt language will improve in order to have pt report communicative effectiveness score to >/= 15    Baseline 13    Time 3    Period Weeks    Status On-going      SLP SHORT TERM GOAL #4   Title pt will  report ability to participate in simple telephone conversations with strangers such as Teacher, early years/pre or other service reps or medical personnel with extra time and compensations    Baseline daughter has to do so for patient    Time 2    Period Weeks    Status On-going            SLP Long Term Goals - 01/12/21 1159      SLP LONG TERM GOAL #1   Title pt language will improve in order to have pt report communicative effectiveness score to >/= 20    Baseline 13    Time 16    Period --   visits (19 total), for all LTGs   Status On-going      SLP LONG TERM GOAL #2   Title pt will produce functional communication to communicate simple messages salient for pt in 3 sessions with modified independence (compensations)    Baseline simple messages are  non-functional    Time 16    Status On-going      SLP LONG TERM GOAL #3   Title pt will demonstrate understanding of longer auditory messages with modified independence (compensations) such as sermons    Baseline does not understand sermons    Time 16    Status On-going            Plan - 01/12/21 1156    Clinical Impression Statement 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 Written expression at word level is relative strength, initiated training in using written word to augment speech and pre-write key words prior to business and medical appointments to aid communication. Ongoing speech therapy recommended to maximize communication for safety, independnece, her daughter's safety and return to PLOF    Speech Therapy Frequency 2x / week   2x a week for 6 weeks, then 1x a week for 6 weeks   Treatment/Interventions Environmental controls;Functional tasks;Compensatory techniques;Multimodal communcation approach;SLP instruction and feedback;Cueing hierarchy;Language facilitation;Internal/external aids;Patient/family education    Potential to Achieve Goals Good           Patient will benefit from skilled therapeutic intervention in order to improve the following deficits and impairments:   Aphasia    Problem List Patient Active Problem List   Diagnosis Date Noted  . Ptosis of left eyelid 09/18/2019  . Speech disturbance 02/28/2019  . Paresthesia 02/28/2019  . Cerebrovascular accident (HCC) 11/02/2017  . 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 History of TTP (thrombotic thrombocytopenic purpura)   . 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 Numbness on left side 08/16/2017  . Acute left lumbar radiculopathy 10/13/2016  . Chronic migraine 06/01/2016  . Pseudotumor cerebri 11/03/2015  . DENTAL PAIN 06/09/2010  . HEADACHE, TENSION 02/04/2010  . SCABIES 01/13/2010  . FOOT PAIN, LEFT 01/13/2010  . CERUMEN  IMPACTION, BILATERAL 11/24/2009  . PSORIASIS 11/24/2009  . Smoker 10/15/2009  . 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 PYELONEPHRITIS 10/01/2009  . THROMBOTIC THROMBOCYTOPENIC PURPURA 12/13/2005    Krista Maddox, Radene Journey MS, CCC-SLP 01/12/2021, 12:03 PM  [facility] [address] [place], [place], [address] Phone: [phone]   Fax:  [phone]   Name: Krista Maddox MRN: [id number] Date of Birth: March 29, 1986

## 2021-01-14 ENCOUNTER — Encounter: Payer: Medicaid Other | Admitting: Speech Pathology

## 2021-01-19 ENCOUNTER — Other Ambulatory Visit: Payer: Self-pay

## 2021-01-19 ENCOUNTER — Ambulatory Visit: Payer: Medicare Other | Attending: Family Medicine

## 2021-01-19 DIAGNOSIS — R4701 Aphasia: Secondary | ICD-10-CM

## 2021-01-19 NOTE — Patient Instructions (Signed)
Complete fill-in-blank words. Try to describe words to help cue yourself  Make sentences with words from worksheet  Give yourself breaks if you get tired or frustrated

## 2021-01-19 NOTE — Therapy (Addendum)
[facility] [address] [place], [place], [address] Phone: [phone]   Fax:  [phone]  Speech Language Pathology Treatment  Patient Details  Name: Krista Maddox MRN: [id number] Date of Birth: 05-Jan-1986 Referring Provider (SLP): Delbert Harness, MD   Encounter Date: 01/19/2021   End of Session - 01/19/21 1224    Visit Number 5    Number of Visits 19    Date for SLP Re-Evaluation 03/19/21    Authorization Time Period 01/02/21-02/04/21    Authorization - Visit Number 4    Authorization - Number of Visits 19    Progress Note Due on Visit 0    SLP Start Time 0110    SLP Stop Time  1145    SLP Time Calculation (min) 635 min           Past Medical History:  Diagnosis Date  . DDD (degenerative disc disease), lumbar   . Headache   . Other disorders of optic nerve, not elsewhere classified, unspecified eye   . Stroke (HCC)   . TTP (thrombotic thrombocytopenic purpura)     Past Surgical History:  Procedure Laterality Date  . CESAREAN SECTION    . CHOLECYSTECTOMY    . TEE WITHOUT CARDIOVERSION N/A 08/22/2017   Procedure: TRANSESOPHAGEAL ECHOCARDIOGRAM (TEE);  Surgeon: Chilton Si, MD;  Location: [facility] ENDOSCOPY;  Service: Cardiovascular;  Laterality: N/A;  . WISDOM TOOTH EXTRACTION      There were no vitals filed for this visit.   Subjective Assessment - 01/19/21 1104    Subjective "Somethings I can't remember"    Currently in Pain? Yes    Pain Score 6     Pain Location Head    Aggravating Factors  weather                 ADULT SLP TREATMENT - 01/19/21 0001      General Information   Behavior/Cognition Alert;Cooperative;Pleasant mood      Treatment Provided   Treatment provided Cognitive-Linquistic      Cognitive-Linquistic Treatment   Treatment focused on Aphasia;Patient/family/caregiver education    Skilled Treatment Krista Maddox reports she contacted [facility] group who returned  phone call. Pt aware of need to call again and endorses she is comfortable contacting them with previous script. Pt endorsed she used her written script for MD appointment, which was effective. Pt stated she needed SLP assistance to complete HEP from last session for ID missing letter for words. SLP provided intermittent first letter, category cues, and mild to mod semantic cues to ID words, which was effective. SLP educated patient on taking breaks and utilizing compensatory strategies, including descriptions and first letters, to self cue, in which pt verbalized understanding and able to demonstrate given SLP modeling. SLP recommended using words from targeted worksheet to generate functional sentences.      Assessment / Recommendations / Plan   Plan Continue with current plan of care      Progression Toward Goals   Progression toward goals Progressing toward goals            SLP Education - 01/19/21 1143    Education Details compensatory strategies, energy conversation    Person(s) Educated Patient    Methods Explanation;Demonstration;Verbal cues    Comprehension Verbalized understanding;Returned demonstration;Verbal cues required            SLP Short Term Goals - 01/19/21 1150      SLP SHORT TERM GOAL #1   Title Complete formal aphasia assessment  Baseline AQ 70.2/100 - moderate aphasia    Time 1    Period Weeks   or 3 total sessions   Status Achieved      SLP SHORT TERM GOAL #2   Title pt will produce sentence responses with compensations 80% success    Baseline 50% with mod A    Time 2    Period Weeks   or 9 total sesions, for all STGs   Status On-going      SLP SHORT TERM GOAL #3   Title pt language will improve in order to have pt report communicative effectiveness score to >/= 15    Baseline 13    Time 3    Period Weeks    Status On-going      SLP SHORT TERM GOAL #4   Title pt will report ability to participate in simple telephone conversations with strangers  such as Teacher, early years/pre or other service reps or medical personnel with extra time and compensations    Baseline pt used pre-scripted information for aphasia group and MD apt with reported success    Time 2    Period Weeks    Status On-going            SLP Long Term Goals - 01/19/21 1152      SLP LONG TERM GOAL #1   Title pt language will improve in order to have pt report communicative effectiveness score to >/= 20    Baseline 13    Time 16    Period --   visits (19 total), for all LTGs   Status On-going      SLP LONG TERM GOAL #2   Title pt will produce functional communication to communicate simple messages salient for pt in 3 sessions with modified independence (compensations)    Baseline simple messages are non-functional    Time 16    Status On-going      SLP LONG TERM GOAL #3   Title pt will demonstrate understanding of longer auditory messages with modified independence (compensations) such as sermons    Baseline does not understand sermons    Time 16    Status On-going            Plan - 01/19/21 1144    Clinical Impression Statement 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 Written scripting has been effective in improving communication effectivness for scheduling appointments and conversations with medical professionals. SLP provided intermittent semantic and phonemic cues for word finding and spelling on written structured tasks. Pt would continue to benefit from skilled ST intervention to maximize QOL and aid in return to PLOF.    Speech Therapy Frequency 2x / week    Treatment/Interventions Compensatory strategies;Patient/family education;Cueing hierarchy;SLP instruction and feedback;Compensatory techniques    Potential to Achieve Goals Good    SLP Home Exercise Plan talk path therapy           Patient will benefit from skilled therapeutic intervention in order to improve the following deficits and impairments:    Aphasia    Problem List Patient Active Problem List   Diagnosis Date Noted  . Ptosis of left eyelid 09/18/2019  . Speech disturbance 02/28/2019  . Paresthesia 02/28/2019  . Cerebrovascular accident (HCC) 11/02/2017  . 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 History of TTP (thrombotic thrombocytopenic purpura)   . 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 Numbness on left side 08/16/2017  . Acute  left lumbar radiculopathy 10/13/2016  . Chronic migraine 06/01/2016  . Pseudotumor cerebri 11/03/2015  . DENTAL PAIN 06/09/2010  . HEADACHE, TENSION 02/04/2010  . SCABIES 01/13/2010  . FOOT PAIN, LEFT 01/13/2010  . CERUMEN IMPACTION, BILATERAL 11/24/2009  . PSORIASIS 11/24/2009  . Smoker 10/15/2009  . 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 PYELONEPHRITIS 10/01/2009  . 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 [place], [place], [address] Phone: [phone]   Fax:  [phone]   Name: Krista Maddox MRN: [id number] Date of Birth: [birth year]

## 2021-01-21 ENCOUNTER — Encounter: Payer: Medicaid Other | Admitting: Speech Pathology

## 2021-01-26 ENCOUNTER — Ambulatory Visit: Payer: Medicare Other

## 2021-01-26 ENCOUNTER — Other Ambulatory Visit: Payer: Self-pay

## 2021-01-26 DIAGNOSIS — R4701 Aphasia: Secondary | ICD-10-CM | POA: Diagnosis not present

## 2021-01-26 NOTE — Patient Instructions (Addendum)
   If you did not understand a sermon at church, you can re-listen to that sermon on church facebook page. You can always pause it to make sure you are paying attention and understanding what is being said.    **Relisten to church sermon from 01/25/21: press pause when you lose attention, get distracted, or you don't understand.  -Write that time -Try to go back and listen again if you don't understand.  -If you lose attention, take a small 1 minute break.   **Find 3 favorite church songs/hymns you would sing regularly at church. Bring those names to therapy so we can print them out and sing along. Then you can practice at home for multitasking (auditory and written).

## 2021-01-26 NOTE — Therapy (Signed)
[facility] [address] [place], [place], [address] Phone: [phone]   Fax:  [phone]  Speech Language Pathology Treatment  Patient Details  Name: Krista Maddox MRN: [id number] Date of Birth: Jul 12, 1986 Referring Provider (SLP): Delbert Harness, MD   Encounter Date: 01/26/2021   End of Session - 01/26/21 1155    Visit Number 6    Number of Visits 19    Date for SLP Re-Evaluation 03/19/21    Authorization Type medicaid    Authorization Time Period 01/02/21-02/04/21    Authorization - Visit Number 5    Authorization - Number of Visits 19    SLP Start Time 1100    SLP Stop Time  1145    SLP Time Calculation (min) 45 min    Activity Tolerance Patient tolerated treatment well           Past Medical History:  Diagnosis Date  . DDD (degenerative disc disease), lumbar   . Headache   . Other disorders of optic nerve, not elsewhere classified, unspecified eye   . Stroke (HCC)   . TTP (thrombotic thrombocytopenic purpura)     Past Surgical History:  Procedure Laterality Date  . CESAREAN SECTION    . CHOLECYSTECTOMY    . TEE WITHOUT CARDIOVERSION N/A 08/22/2017   Procedure: TRANSESOPHAGEAL ECHOCARDIOGRAM (TEE);  Surgeon: Chilton Si, MD;  Location: [facility] ENDOSCOPY;  Service: Cardiovascular;  Laterality: N/A;  . WISDOM TOOTH EXTRACTION      There were no vitals filed for this visit.   Subjective Assessment - 01/26/21 1104    Subjective "I didn't get any homework done last week" re: busy week. Pt reports feeling tired    Currently in Pain? Yes    Pain Score 8     Pain Location Head   migraine   Pain Descriptors / Indicators Aching    Pain Type Acute pain                 ADULT SLP TREATMENT - 01/26/21 1105      General Information   Behavior/Cognition Alert;Cooperative;Pleasant mood      Cognitive-Linquistic Treatment   Treatment focused on Aphasia;Patient/family/caregiver education     Skilled Treatment Krista Maddox reports having had busy previous week (daughter had surgery) resulting in little sleep. Increased anomia in conversation noted this session seemingly related to fatigue. SLP instructed use of compensations of descriptions and synonyms for word finding, which was intermittently effective. SLP inquired about auditory comprehension of patient preferred topics (church sermons), in which pt reports she loses attention after  ~20 mins if she has to multitask for listening and reading. Pt reports she has difficulty singing along with church songs, for both  reading or listening. SLP targeted ~5 min TedTalk related to patient interests, with SLP trialing note taking. No independent notetaking demonstrated. Good sustained attention exhibited. Pt able to verbalize summary with mod A for word finding, with good comprehension noted. SLP provided HWK to listen and comprehend most recent church sermon and bring in 3 favorite hymns.      Assessment / Recommendations / Plan   Plan Continue with current plan of care      Progression Toward Goals   Progression toward goals Progressing toward goals            SLP Education - 01/26/21 1154    Education Details compensations, attention strats for auditory comprehension tasks    Person(s) Educated Patient    Methods Explanation;Demonstration;Verbal cues;Handout  Comprehension Verbalized understanding;Returned demonstration;Verbal cues required;Need further instruction            SLP Short Term Goals - 01/26/21 1159      SLP SHORT TERM GOAL #1   Title Complete formal aphasia assessment    Baseline AQ 70.2/100 - moderate aphasia    Time 1    Period Weeks   or 3 total sessions   Status Achieved      SLP SHORT TERM GOAL #2   Title pt will produce sentence responses with compensations 80% success    Baseline 50% with mod A    Time 2    Period Weeks   or 9 total sesions, for all STGs   Status On-going      SLP SHORT TERM GOAL #3    Title pt language will improve in order to have pt report communicative effectiveness score to >/= 15    Baseline 13    Time 3    Period Weeks    Status On-going      SLP SHORT TERM GOAL #4   Title pt will report ability to participate in simple telephone conversations with strangers such as Teacher, early years/pre or other service reps or medical personnel with extra time and compensations    Baseline pt used pre-scripted information for aphasia group and MD apt with reported success    Time 2    Period Weeks    Status On-going            SLP Long Term Goals - 01/26/21 1159      SLP LONG TERM GOAL #1   Title pt language will improve in order to have pt report communicative effectiveness score to >/= 20    Baseline 13    Time 16    Period --   visits (19 total), for all LTGs   Status On-going      SLP LONG TERM GOAL #2   Title pt will produce functional communication to communicate simple messages salient for pt in 3 sessions with modified independence (compensations)    Baseline simple messages are non-functional    Time 16    Status On-going      SLP LONG TERM GOAL #3   Title pt will demonstrate understanding of longer auditory messages with modified independence (compensations) such as sermons    Baseline does not understand sermons    Time 16    Status On-going            Plan - 01/26/21 1156    Clinical Impression Statement 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 Pt reports increased difficulty with auditory comprehension for church sermons, in which SLP provided techniques to optimize auditory comprehension and reduce burden of multitasking. SLP provided intermittent semantic and phonemic cues for word finding on structured tasks this session. Pt would continue to benefit from skilled ST intervention to maximize QOL and aid in return to PLOF.    Speech Therapy Frequency 2x / week    Treatment/Interventions Compensatory  strategies;Patient/family education;Cueing hierarchy;SLP instruction and feedback;Compensatory techniques;Functional tasks;Multimodal communcation approach    Potential to Achieve Goals Good    SLP Home Exercise Plan HEP, compensations    Consulted and Agree with Plan of Care Patient           Patient will benefit from skilled therapeutic intervention in order to improve the following deficits and impairments:   Aphasia    Problem List Patient Active Problem List   Diagnosis  Date Noted  . Ptosis of left eyelid 09/18/2019  . Speech disturbance 02/28/2019  . Paresthesia 02/28/2019  . Cerebrovascular accident (HCC) 11/02/2017  . 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 History of TTP (thrombotic thrombocytopenic purpura)   . 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 Numbness on left side 08/16/2017  . Acute left lumbar radiculopathy 10/13/2016  . Chronic migraine 06/01/2016  . Pseudotumor cerebri 11/03/2015  . DENTAL PAIN 06/09/2010  . HEADACHE, TENSION 02/04/2010  . SCABIES 01/13/2010  . FOOT PAIN, LEFT 01/13/2010  . CERUMEN IMPACTION, BILATERAL 11/24/2009  . PSORIASIS 11/24/2009  . Smoker 10/15/2009  . 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 PYELONEPHRITIS 10/01/2009  . THROMBOTIC THROMBOCYTOPENIC PURPURA 12/13/2005    Janann Colonel , MA CCC-SLP 01/26/2021, 12:01 PM  [facility] [address] [place], [place], [address] Phone: [phone]   Fax:  [phone]   Name: Krista Maddox MRN: [id number] Date of Birth: 1986/01/13

## 2021-01-28 ENCOUNTER — Other Ambulatory Visit: Payer: Self-pay

## 2021-01-28 ENCOUNTER — Ambulatory Visit: Payer: Medicare Other | Admitting: Speech Pathology

## 2021-01-28 ENCOUNTER — Encounter: Payer: Self-pay | Admitting: Speech Pathology

## 2021-01-28 DIAGNOSIS — R4701 Aphasia: Secondary | ICD-10-CM | POA: Diagnosis not present

## 2021-01-28 NOTE — Therapy (Signed)
[facility] [address] [place], [place], [address] Phone: [phone]   Fax:  [phone]  Speech Language Pathology Treatment  Patient Details  Name: Krista Maddox MRN: [id number] Date of Birth: 05-17-1986 Referring Provider (SLP): Delbert Harness, MD   Encounter Date: 01/28/2021   End of Session - 01/28/21 1154    Visit Number 7    Number of Visits 19    Date for SLP Re-Evaluation 03/19/21    Authorization Type medicaid    Authorization Time Period 01/02/21-02/04/21    Authorization - Visit Number 6    SLP Start Time 1102    SLP Stop Time  1145    SLP Time Calculation (min) 43 min    Activity Tolerance Patient tolerated treatment well           Past Medical History:  Diagnosis Date  . DDD (degenerative disc disease), lumbar   . Headache   . Other disorders of optic nerve, not elsewhere classified, unspecified eye   . Stroke (HCC)   . TTP (thrombotic thrombocytopenic purpura)     Past Surgical History:  Procedure Laterality Date  . CESAREAN SECTION    . CHOLECYSTECTOMY    . TEE WITHOUT CARDIOVERSION N/A 08/22/2017   Procedure: TRANSESOPHAGEAL ECHOCARDIOGRAM (TEE);  Surgeon: Chilton Si, MD;  Location: [facility] ENDOSCOPY;  Service: Cardiovascular;  Laterality: N/A;  . WISDOM TOOTH EXTRACTION      There were no vitals filed for this visit.   Subjective Assessment - 01/28/21 1111    Subjective "I had to find 3 songs from church"    Currently in Pain? Yes    Pain Score 8     Pain Location Head    Pain Orientation Anterior    Pain Descriptors / Indicators Aching    Pain Type Chronic pain    Pain Onset More than a month ago    Pain Relieving Factors none                 ADULT SLP TREATMENT - 01/28/21 1112      General Information   Behavior/Cognition Alert;Cooperative;Pleasant mood      Treatment Provided   Treatment provided Cognitive-Linquistic      Cognitive-Linquistic Treatment    Treatment focused on Aphasia;Patient/family/caregiver education    Skilled Manufacturing engineer ID'd 3 church songs (Amazing Delorise Shiner, How [place]ou [employer], In the Garden). She has practiced these. Marine required extended time and occasional mod A for spelling her favorite. She is using captions to A with auditory comprehension of shows. Jhanae is writing down notes during and after her MD appointmetns to assist her comprehension and recall of information. She endorses clutter in her car and room. We discussed how clutter affects her ability to focus and maintain organizatio. She is working on [employer] with her daughter. Toriana missed the aphasia zoom meeting for [facility] as she "didn't pay attention" to her emails. We generated strategy of setting next 2 aphasia groups in her calendar with alert to prompt her to check her email for the link prior to zoom. Teacher, English as a foreign language required consistent extended time for word finding and usual mod questioning cues to use verbal compensations for word finding. She will continue to work on Materials engineer for Homework      Progression Toward Goals   Progression toward goals Progressing toward goals            SLP Education - 01/28/21 1145    Education Details  Look at menu before going out, practice your order    Person(s) Educated Patient    Methods Demonstration;Verbal cues;Handout    Comprehension Verbalized understanding;Verbal cues required;Need further instruction            SLP Short Term Goals - 01/28/21 1151      SLP SHORT TERM GOAL #1   Title Complete formal aphasia assessment    Baseline AQ 70.2/100 - moderate aphasia    Time 1    Period Weeks   or 3 total sessions   Status Achieved      SLP SHORT TERM GOAL #2   Title pt will produce sentence responses with compensations 80% success    Baseline 50% with mod A    Time 1    Period Weeks   or 9 total sesions, for all STGs   Status On-going      SLP SHORT TERM GOAL #3    Title pt language will improve in order to have pt report communicative effectiveness score to >/= 15    Baseline 13    Time 2    Period Weeks    Status On-going      SLP SHORT TERM GOAL #4   Title pt will report ability to participate in simple telephone conversations with strangers such as Teacher, early years/pre or other service reps or medical personnel with extra time and compensations    Baseline pt used pre-scripted information for aphasia group and MD apt with reported success    Time 1    Period Weeks    Status On-going            SLP Long Term Goals - 01/28/21 1153      SLP LONG TERM GOAL #1   Title pt language will improve in order to have pt report communicative effectiveness score to >/= 20    Baseline 13    Time 16    Period --   visits (19 total), for all LTGs   Status On-going      SLP LONG TERM GOAL #2   Title pt will produce functional communication to communicate simple messages salient for pt in 3 sessions with modified independence (compensations)    Baseline simple messages are non-functional    Time 16    Status On-going      SLP LONG TERM GOAL #3   Title pt will demonstrate understanding of longer auditory messages with modified independence (compensations) such as sermons    Baseline does not understand sermons    Time 16    Status On-going            Plan - 01/28/21 1145    Clinical Impression Statement Pt presents with mils to 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 Pt reports increased difficulty with auditory comprehension for church sermons, in which SLP provided techniques to optimize auditory comprehension and reduce burden of multitasking. SLP provided intermittent semantic and phonemic cues for word finding on structured tasks this session. Pt would continue to benefit from skilled ST intervention to maximize QOL and aid in return to PLOF.    Speech Therapy Frequency 2x / week    Duration --   2x a week for 6 weeks, then 1x a  week for 6 weeks   Treatment/Interventions Compensatory strategies;Patient/family education;Cueing hierarchy;SLP instruction and feedback;Compensatory techniques;Functional tasks;Multimodal communcation approach    Potential to Achieve Goals Good           Patient will benefit from skilled therapeutic intervention  in order to improve the following deficits and impairments:   Aphasia    Problem List Patient Active Problem List   Diagnosis Date Noted  . Ptosis of left eyelid 09/18/2019  . Speech disturbance 02/28/2019  . Paresthesia 02/28/2019  . Cerebrovascular accident (HCC) 11/02/2017  . 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 History of TTP (thrombotic thrombocytopenic purpura)   . 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 Numbness on left side 08/16/2017  . Acute left lumbar radiculopathy 10/13/2016  . Chronic migraine 06/01/2016  . Pseudotumor cerebri 11/03/2015  . DENTAL PAIN 06/09/2010  . HEADACHE, TENSION 02/04/2010  . SCABIES 01/13/2010  . FOOT PAIN, LEFT 01/13/2010  . CERUMEN IMPACTION, BILATERAL 11/24/2009  . PSORIASIS 11/24/2009  . Smoker 10/15/2009  . 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 PYELONEPHRITIS 10/01/2009  . THROMBOTIC THROMBOCYTOPENIC PURPURA 12/13/2005    Rendi Mapel, Radene Journey MS, CCC-SLP 01/28/2021, 11:55 AM  [facility] [address] [place], [place], [address] Phone: [phone]   Fax:  [phone]   Name: SHAMEIKA SPEELMAN MRN: [id number] Date of Birth: 19-Oct-1986

## 2021-02-04 ENCOUNTER — Other Ambulatory Visit: Payer: Self-pay

## 2021-02-04 ENCOUNTER — Ambulatory Visit: Payer: Medicare Other

## 2021-02-04 DIAGNOSIS — R4701 Aphasia: Secondary | ICD-10-CM

## 2021-02-04 NOTE — Patient Instructions (Signed)
   Write down words that you are having trouble saying or avoiding (ex: "grocery" store). Try your best with spelling but it's okay if there are errors. We will continue to practice getting these words out  Keep using TalkPath. Try for one time as day when possible

## 2021-02-04 NOTE — Therapy (Signed)
[facility] [address] [place], [place], [address] Phone: [phone]   Fax:  [phone]  Speech Language Pathology Treatment  Patient Details  Name: Krista Maddox MRN: [id number] Date of Birth: July 29, 1986 Referring Provider (SLP): Delbert Harness, MD   Encounter Date: 02/04/2021   End of Session - 02/04/21 1247    Visit Number 8    Number of Visits 19    Date for SLP Re-Evaluation 03/19/21    Authorization Type medicaid    Authorization Time Period 01/02/21-02/04/21    Authorization - Visit Number 7    Authorization - Number of Visits 19    SLP Start Time 1100    SLP Stop Time  1145    SLP Time Calculation (min) 45 min    Activity Tolerance Patient tolerated treatment well           Past Medical History:  Diagnosis Date  . DDD (degenerative disc disease), lumbar   . Headache   . Other disorders of optic nerve, not elsewhere classified, unspecified eye   . Stroke (HCC)   . TTP (thrombotic thrombocytopenic purpura)     Past Surgical History:  Procedure Laterality Date  . CESAREAN SECTION    . CHOLECYSTECTOMY    . TEE WITHOUT CARDIOVERSION N/A 08/22/2017   Procedure: TRANSESOPHAGEAL ECHOCARDIOGRAM (TEE);  Surgeon: Chilton Si, MD;  Location: [facility] ENDOSCOPY;  Service: Cardiovascular;  Laterality: N/A;  . WISDOM TOOTH EXTRACTION      There were no vitals filed for this visit.   Subjective Assessment - 02/04/21 1104    Subjective "it's about the same" re: speech (fluctuations)    Currently in Pain? No/denies    Pain Score 0-No pain                 ADULT SLP TREATMENT - 02/04/21 1105      General Information   Behavior/Cognition Alert;Cooperative;Pleasant mood      Treatment Provided   Treatment provided Cognitive-Linquistic      Cognitive-Linquistic Treatment   Treatment focused on Aphasia;Patient/family/caregiver education    Skilled Treatment Pt told SLP about dinner with  daughter at [address] Pt able to name foods eaten x3 at restaurant with mild hesitation noted. Pt attempted to verbalize "marshmellow" as topping on sweet potato, with pt approximating "mellow" with additional processing time. SLP trialed description, phonemic cues, and first letter cues with minimal success. SLP cued written communication, which was effective for 2/4 opportunites. Spelling errors impacted effectiveness of written communication, as pt fixated on errors. Pt reports she avoids certain words (ex: omitting "grocery" from "grocery store"). Pt stated "I can't even say my favorite place" referring to [facility] SLP targeted functional naming with written prompts provided, which was effective with repetition and SLP modeling. Slow speech noted secondary to fear of errors. Pt would continue to benefit from continuation of skilled ST services as aphasia and  apraxia continue to impact functional communication effectiveness with familiar and unfamiliar speakers.      Assessment / Recommendations / Plan   Plan Continue with current plan of care      Progression Toward Goals   Progression toward goals Progressing toward goals            SLP Education - 02/04/21 1146    Education Details target troublesome words to reduce avoiding, practice your words    Person(s) Educated Patient    Methods Explanation;Demonstration;Handout;Verbal cues    Comprehension Verbalized understanding;Returned demonstration;Verbal cues required;Need further instruction  SLP Short Term Goals - 02/04/21 1251      SLP SHORT TERM GOAL #1   Title Complete formal aphasia assessment    Baseline AQ 70.2/100 - moderate aphasia    Period --   or 3 total sessions   Status Achieved      SLP SHORT TERM GOAL #2   Title pt will produce sentence responses with compensations 80% success    Baseline 70% accurate with min to mod A    Time 1    Period Weeks   or 9 total sesions, for all STGs    Status On-going      SLP SHORT TERM GOAL #3   Title pt language will improve in order to have pt report communicative effectiveness score to >/= 15    Baseline 13    Time 1    Period Weeks    Status On-going      SLP SHORT TERM GOAL #4   Title pt will report ability to participate in simple telephone conversations with strangers such as Teacher, early years/pre or other service reps or medical personnel with extra time and compensations    Baseline pt used pre-scripted information for aphasia group and MD apt with reported success    Time 1    Period Weeks    Status On-going            SLP Long Term Goals - 02/04/21 1252      SLP LONG TERM GOAL #1   Title pt language will improve in order to have pt report communicative effectiveness score to >/= 20    Baseline 13    Time 15    Period --   visits (19 total), for all LTGs   Status On-going      SLP LONG TERM GOAL #2   Title pt will produce functional communication to communicate simple messages salient for pt in 3 sessions with modified independence (compensations)    Baseline 02-04-21    Time 15    Status On-going      SLP LONG TERM GOAL #3   Title pt will demonstrate understanding of longer auditory messages with modified independence (compensations) such as sermons    Baseline note taking    Time 15    Status On-going            Plan - 02/04/21 1247    Clinical Impression Statement 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 Pt continues to report difficulty with speech production and word finding, with pt stating "I can't even say my favorite word." Avoiding and substituting words demonstrated, which impacted overall message and listener comprehension of verbal output. Pt with increasing use of multimodal communication to facilitate conversation, which was intermittently effective this sessoin. Given significant impact of communication on day to day tasks (communicating with medical  professionals, talking with school teachers, ordering food at restaurants), SLP recommends continuation of skilled ST services 2x/week for 6 weeks. Pt would benefit from skilled ST intervention to maximize QOL and aid in return to PLOF.           Patient will benefit from skilled therapeutic intervention in order to improve the following deficits and impairments:   Aphasia    Problem List Patient Active Problem List   Diagnosis Date Noted  . Ptosis of left eyelid 09/18/2019  . Speech disturbance 02/28/2019  . Paresthesia 02/28/2019  . Cerebrovascular accident (HCC) 11/02/2017  . Cerebrovascular accident (CVA) due to thrombosis  of anterior cerebral artery (HCC)   . History of TTP (thrombotic thrombocytopenic purpura)   . 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 Numbness on left side 08/16/2017  . Acute left lumbar radiculopathy 10/13/2016  . Chronic migraine 06/01/2016  . Pseudotumor cerebri 11/03/2015  . DENTAL PAIN 06/09/2010  . HEADACHE, TENSION 02/04/2010  . SCABIES 01/13/2010  . FOOT PAIN, LEFT 01/13/2010  . CERUMEN IMPACTION, BILATERAL 11/24/2009  . PSORIASIS 11/24/2009  . Smoker 10/15/2009  . 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 PYELONEPHRITIS 10/01/2009  . THROMBOTIC THROMBOCYTOPENIC PURPURA 12/13/2005    Janann Colonel, MA CCC-SLP 02/04/2021, 12:54 PM  [facility] [address] [place], [place], [address] Phone: [phone]   Fax:  [phone]   Name: Krista Maddox MRN: [id number] Date of Birth: 09/19/1986

## 2021-02-06 ENCOUNTER — Other Ambulatory Visit: Payer: Self-pay

## 2021-02-06 ENCOUNTER — Ambulatory Visit: Payer: Medicare Other

## 2021-02-06 DIAGNOSIS — R4701 Aphasia: Secondary | ICD-10-CM

## 2021-02-06 NOTE — Patient Instructions (Signed)
   Put [employer] handout in your car for situations the app doesn't work. Also, you can look in the app "Notes" to find your [employer] order too. Practice saying your order at home.   Practice speaking at home to yourself or the dog for extra practice. Narrate what you're doing (ex: explain how to use the washer).  Keeping speaking slow. You're a great job!

## 2021-02-06 NOTE — Therapy (Signed)
[facility] [address] [place], [place], [address] Phone: [phone]   Fax:  [phone]  Speech Language Pathology Treatment  Patient Details  Name: Krista Maddox MRN: [id number] Date of Birth: 12-28-1985 Referring Provider (SLP): Delbert Harness, MD   Encounter Date: 02/06/2021   End of Session - 02/06/21 1421    Visit Number 9    Number of Visits 19    Date for SLP Re-Evaluation 03/19/21    Authorization Type medicaid    Authorization - Visit Number 8    Authorization - Number of Visits 19    SLP Start Time 1101    SLP Stop Time  1145    SLP Time Calculation (min) 44 min    Activity Tolerance Patient tolerated treatment well           Past Medical History:  Diagnosis Date  . DDD (degenerative disc disease), lumbar   . Headache   . Other disorders of optic nerve, not elsewhere classified, unspecified eye   . Stroke (HCC)   . TTP (thrombotic thrombocytopenic purpura)     Past Surgical History:  Procedure Laterality Date  . CESAREAN SECTION    . CHOLECYSTECTOMY    . TEE WITHOUT CARDIOVERSION N/A 08/22/2017   Procedure: TRANSESOPHAGEAL ECHOCARDIOGRAM (TEE);  Surgeon: Chilton Si, MD;  Location: [facility] ENDOSCOPY;  Service: Cardiovascular;  Laterality: N/A;  . WISDOM TOOTH EXTRACTION      There were no vitals filed for this visit.   Subjective Assessment - 02/06/21 1431    Subjective "I messed up my order"                 ADULT SLP TREATMENT - 02/06/21 1105      General Information   Behavior/Cognition Alert;Cooperative;Pleasant mood      Treatment Provided   Treatment provided Cognitive-Linquistic      Cognitive-Linquistic Treatment   Treatment focused on Aphasia;Patient/family/caregiver education    Skilled Treatment Endorsed some anixety related to step-mother out of town this weeked as this is her first time "alone" stince stroke. Anxiety did not carryover into conversation as  initially expected, with pt able to self-compensate with pausing, slow rate, and infrequent substitutions this session. Pt reported difficulty ordering at [facility] this morning as her app malfuctioned. Pt unable to successfully verbally communciate her order, resulting in wrong order received. SLP provided script training for [facility] order with written handout and note in phone completed. Phonemic cues required for "nitro" and "irish" this session (ex: pt replaced /sh/ for /r/). Breaking down syllables was an effective strategy for accurate speech production. SLP educated patient on practicing at order at home, as well as narrating day to day tasks to increase verbal output and opportunities to correct errors.      Assessment / Recommendations / Plan   Plan Continue with current plan of care      Progression Toward Goals   Progression toward goals Progressing toward goals            SLP Education - 02/06/21 1417    Education Details scripting for [employer] order, practicing at home    Person(s) Educated Patient    Methods Explanation;Demonstration;Handout    Comprehension Verbalized understanding;Returned demonstration            SLP Short Term Goals - 02/06/21 1429      SLP SHORT TERM GOAL #1   Title Complete formal aphasia assessment    Baseline AQ 70.2/100 - moderate aphasia  Period --   or 3 total sessions   Status Achieved      SLP SHORT TERM GOAL #2   Title pt will produce sentence responses with compensations 80% success    Baseline 70% accurate with min to mod A    Time 1    Period Weeks   or 9 total sesions, for all STGs   Status On-going      SLP SHORT TERM GOAL #3   Title pt language will improve in order to have pt report communicative effectiveness score to >/= 15    Baseline 13    Time 1    Period Weeks    Status On-going      SLP SHORT TERM GOAL #4   Title pt will report ability to participate in simple telephone conversations with strangers such as  Teacher, early years/pre or other service reps or medical personnel with extra time and compensations    Baseline pt used pre-scripted information for aphasia group and MD apt with reported success    Time 1    Period Weeks    Status On-going            SLP Long Term Goals - 02/06/21 1429      SLP LONG TERM GOAL #1   Title pt language will improve in order to have pt report communicative effectiveness score to >/= 20    Baseline 13    Time 15    Period --   visits (19 total), for all LTGs   Status On-going      SLP LONG TERM GOAL #2   Title pt will produce functional communication to communicate simple messages salient for pt in 3 sessions with modified independence (compensations)    Baseline 02-04-21    Time 15    Status On-going      SLP LONG TERM GOAL #3   Title pt will demonstrate understanding of longer auditory messages with modified independence (compensations) such as sermons    Baseline note taking    Time 15    Status On-going            Plan - 02/06/21 1423    Clinical Impression Statement 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 Pt continues to report difficulty with speech production and word finding impacting her day to day life. Pt reported she was unable to sucessfully complete verbal order at [facility] this morning secondary to apraxia and aphasia, resulting in wrong drink received. SLP created handout and prompted use of note section on her phone to self-cue herself when she is experiencing anomia or apraxia at [facility]. SLP recommended she practice her order and other narration of tasks at home to increase effectiveness and fluidity of speech. SLP recommends continuation of skilled ST to maximize communication effectiveness, improve QOL, and aid in return to PLOF.    Speech Therapy Frequency 2x / week    Duration 8 weeks    Treatment/Interventions Compensatory strategies;Patient/family education;Cueing hierarchy;SLP  instruction and feedback;Compensatory techniques;Functional tasks;Multimodal communcation approach    Potential to Achieve Goals Good    SLP Home Exercise Plan HEP, compensations    Consulted and Agree with Plan of Care Patient           Patient will benefit from skilled therapeutic intervention in order to improve the following deficits and impairments:   Aphasia    Problem List Patient Active Problem List   Diagnosis Date Noted  . Ptosis of left eyelid  09/18/2019  . Speech disturbance 02/28/2019  . Paresthesia 02/28/2019  . Cerebrovascular accident (HCC) 11/02/2017  . 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 History of TTP (thrombotic thrombocytopenic purpura)   . 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 Numbness on left side 08/16/2017  . Acute left lumbar radiculopathy 10/13/2016  . Chronic migraine 06/01/2016  . Pseudotumor cerebri 11/03/2015  . DENTAL PAIN 06/09/2010  . HEADACHE, TENSION 02/04/2010  . SCABIES 01/13/2010  . FOOT PAIN, LEFT 01/13/2010  . CERUMEN IMPACTION, BILATERAL 11/24/2009  . PSORIASIS 11/24/2009  . Smoker 10/15/2009  . 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 PYELONEPHRITIS 10/01/2009  . THROMBOTIC THROMBOCYTOPENIC PURPURA 12/13/2005    Janann Colonel, MA CCC-SLP 02/06/2021, 2:32 PM  [facility] [address] [place], [place], [address] Phone: [phone]   Fax:  [phone]   Name: Krista Maddox MRN: [id number] Date of Birth: 25-Dec-1985

## 2021-02-09 ENCOUNTER — Other Ambulatory Visit: Payer: Self-pay

## 2021-02-09 ENCOUNTER — Ambulatory Visit: Payer: Medicare Other

## 2021-02-09 DIAGNOSIS — R4701 Aphasia: Secondary | ICD-10-CM

## 2021-02-09 NOTE — Therapy (Signed)
[facility] [address] [place], [place], [address] Phone: [phone]   Fax:  [phone]  Speech Language Pathology Treatment  Patient Details  Name: Krista Maddox MRN: [id number] Date of Birth: July 22, 1986 Referring Provider (SLP): Suzanna Obey, MD   Encounter Date: 02/09/2021   End of Session - 02/09/21 1517    Visit Number 10    Number of Visits 19    Date for SLP Re-Evaluation 03/19/21    Authorization Type medicaid    Authorization - Visit Number 9    Authorization - Number of Visits 19    SLP Start Time 9937    SLP Stop Time  1100    SLP Time Calculation (min) 42 min    Activity Tolerance Patient tolerated treatment well           Past Medical History:  Diagnosis Date  . DDD (degenerative disc disease), lumbar   . Headache   . Other disorders of optic nerve, not elsewhere classified, unspecified eye   . Stroke ([place])   . TTP (thrombotic thrombocytopenic purpura)     Past Surgical History:  Procedure Laterality Date  . CESAREAN SECTION    . CHOLECYSTECTOMY    . TEE WITHOUT CARDIOVERSION N/A 08/22/2017   Procedure: TRANSESOPHAGEAL ECHOCARDIOGRAM (TEE);  Surgeon: Skeet Latch, MD;  Location: [facility];  Service: Cardiovascular;  Laterality: N/A;  . WISDOM TOOTH EXTRACTION      There were no vitals filed for this visit.   Subjective Assessment - 02/09/21 1024    Subjective "It's getting better - trying to slow it down."    Currently in Pain? No/denies                 ADULT SLP TREATMENT - 02/09/21 1026      General Information   Behavior/Cognition Alert;Cooperative;Pleasant mood      Treatment Provided   Treatment provided Cognitive-Linquistic      Pain Assessment   Pain Assessment 0-10    Pain Score 6     Pain Location head    Pain Descriptors / Indicators Headache    Pain Intervention(s) Monitored during session      Cognitive-Linquistic Treatment   Treatment  focused on Aphasia;Patient/family/caregiver education    Skilled Treatment SLP highlihgted the fact that Poppy was successful with her drink order today even though the machine was down. She used her app and was able to say some of the words in the order. SLP also highlighted that pt practiced with previous SLP Thursday and it helped her 4 days later. SLP encouraged pt to cont to practice at home for success to cont with her speech. SLP ascertained how morning routine goes with her daughter - pt does not have any difficulty iin the morning routine even if it gets a bit more hurried/stressed. Pt acknowledges more difficulty at night after dinner. "On a regular day - I'm slee-tired by 4:30." This occurred due to dog waking pt at 1AM for 90 minutes. SLP told pt to take 20-30 minute nap on days like that - pt acknowledged understanding. Lastly  SLP asked pt to capture frustraing experiences so SLP can assist pt in problem solving pt's aphasia in those situations. Pt's communication effectiveness survey score was 15 today.      Assessment / Recommendations / Plan   Plan Continue with current plan of care      Progression Toward Goals   Progression toward goals Progressing toward goals  SLP Short Term Goals - 02/09/21 1519      SLP SHORT TERM GOAL #1   Title Complete formal aphasia assessment    Baseline AQ 70.2/100 - moderate aphasia    Period --   or 3 total sessions   Status Achieved      SLP SHORT TERM GOAL #2   Title pt will produce sentence responses with compensations 80% success    Baseline 70% accurate with min to mod A    Period --   or 9 total sesions, for all STGs   Status Partially Met      SLP SHORT TERM GOAL #3   Title pt language will improve in order to have pt report communicative effectiveness score to >/= 15    Baseline 13    Status Achieved      SLP SHORT TERM GOAL #4   Title pt will report ability to participate in simple telephone conversations with  strangers such as Product manager or other service reps or medical personnel with extra time and compensations    Baseline pt used pre-scripted information for aphasia group and MD apt with reported success    Time 1    Period Weeks    Status On-going            SLP Long Term Goals - 02/06/21 1429      SLP LONG TERM GOAL #1   Title pt language will improve in order to have pt report communicative effectiveness score to >/= 20    Baseline 13    Time 15    Period --   visits (19 total), for all LTGs   Status On-going      SLP LONG TERM GOAL #2   Title pt will produce functional communication to communicate simple messages salient for pt in 3 sessions with modified independence (compensations)    Baseline 02-04-21    Time 15    Status On-going      SLP LONG TERM GOAL #3   Title pt will demonstrate understanding of longer auditory messages with modified independence (compensations) such as sermons    Baseline note taking    Time 15    Status On-going            Plan - 02/09/21 1518    Clinical Impression Statement 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 Pt continues to report difficulty with speech production and word finding impacting her day to day life. Pt reported she was unable to order her usual drink at [facility] this morning secondary to machine out of service, however she used her phrase practiced last session and received her correct drink. SLP created handout and prompted use of note section phone to self-cue herself when she is experiencing anomia or apraxia at [facility]. SLP recommended she practice her order and other narration of tasks at home to increase effectivness and fluidity of speech. SLP recommends continuation of skilled ST to maximize communication effectiveness, improve QOL, and aid in return to PLOF.    Speech Therapy Frequency 2x / week    Duration 8 weeks    Treatment/Interventions Compensatory  strategies;Patient/family education;Cueing hierarchy;SLP instruction and feedback;Compensatory techniques;Functional tasks;Multimodal communcation approach    Potential to Achieve Goals Good    SLP Home Exercise Plan HEP, compensations    Consulted and Agree with Plan of Care Patient           Patient will benefit from skilled therapeutic intervention in order to  improve the following deficits and impairments:   Aphasia    Problem List Patient Active Problem List   Diagnosis Date Noted  . Ptosis of left eyelid 09/18/2019  . Speech disturbance 02/28/2019  . Paresthesia 02/28/2019  . Cerebrovascular accident ([place]) 11/02/2017  . Cerebrovascular accident (CVA) due to thrombosis of anterior cerebral artery ([place])   . History of TTP (thrombotic thrombocytopenic purpura)   . 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 Numbness on left side 08/16/2017  . Acute left lumbar radiculopathy 10/13/2016  . Chronic migraine 06/01/2016  . Pseudotumor cerebri 11/03/2015  . DENTAL PAIN 06/09/2010  . HEADACHE, TENSION 02/04/2010  . SCABIES 01/13/2010  . FOOT PAIN, LEFT 01/13/2010  . CERUMEN IMPACTION, BILATERAL 11/24/2009  . PSORIASIS 11/24/2009  . Smoker 10/15/2009  . 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 PYELONEPHRITIS 10/01/2009  . THROMBOTIC THROMBOCYTOPENIC PURPURA 12/13/2005    [facility] ,MS, CCC-SLP  02/09/2021, 3:21 PM  [facility] [address] [place], [place], [address] Phone: [phone]   Fax:  [phone]   Name: Krista Maddox MRN: [id number] Date of Birth: [birth year]

## 2021-02-16 ENCOUNTER — Other Ambulatory Visit: Payer: Self-pay

## 2021-02-16 ENCOUNTER — Ambulatory Visit: Payer: Medicare Other | Attending: Family Medicine

## 2021-02-16 DIAGNOSIS — R4701 Aphasia: Secondary | ICD-10-CM | POA: Insufficient documentation

## 2021-02-16 NOTE — Therapy (Signed)
[facility] [address] [place], [place], [address] Phone: [phone]   Fax:  [phone]  Speech Language Pathology Treatment  Patient Details  Name: Krista Maddox MRN: [id number] Date of Birth: 01-24-1986 Referring Provider (SLP): Suzanna Obey, MD   Encounter Date: 02/16/2021   End of Session - 02/16/21 1610    Visit Number 11    Number of Visits 19    Date for SLP Re-Evaluation 03/19/21    Authorization Type medicaid    Authorization - Visit Number 10    Authorization - Number of Visits 19    SLP Start Time 1696    SLP Stop Time  1100    SLP Time Calculation (min) 41 min    Activity Tolerance Patient tolerated treatment well           Past Medical History:  Diagnosis Date  . DDD (degenerative disc disease), lumbar   . Headache   . Other disorders of optic nerve, not elsewhere classified, unspecified eye   . Stroke ([place])   . TTP (thrombotic thrombocytopenic purpura)     Past Surgical History:  Procedure Laterality Date  . CESAREAN SECTION    . CHOLECYSTECTOMY    . TEE WITHOUT CARDIOVERSION N/A 08/22/2017   Procedure: TRANSESOPHAGEAL ECHOCARDIOGRAM (TEE);  Surgeon: Skeet Latch, MD;  Location: [facility];  Service: Cardiovascular;  Laterality: N/A;  . WISDOM TOOTH EXTRACTION      There were no vitals filed for this visit.   Subjective Assessment - 02/16/21 1022    Subjective "I asked him for a  - bag of - spring - yah, spring mix."    Currently in Pain? No/denies                 ADULT SLP TREATMENT - 02/16/21 1025      General Information   Behavior/Cognition Alert;Cooperative;Pleasant mood      Treatment Provided   Treatment provided Cognitive-Linquistic      Cognitive-Linquistic Treatment   Treatment focused on Aphasia;Patient/family/caregiver education    Skilled Treatment Pt explained a situation with her smaller dog, and with her aunt's new car (total 25 minutes mod  complex) with extra time, successfully (internet, transmission, pain pills, pedigree). Pt used intonational patterns x3 to compensate for emotional words. SLP targeted more complex word finding with verbal sequencing and pt demonstrated occasional phonemic errors which pt noted (garlic, oregano, simmer). SLp cued pt to use written cues which were not always successful although pt wrote word correctly. Pt to come prepared to describe her favorite ride at [facility] - she is to write down words or put them into her phone in order to assist her description for SLP next session. Pt "loved" the aphasia group with [employer].      Assessment / Recommendations / Plan   Plan Continue with current plan of care      Progression Toward Goals   Progression toward goals Progressing toward goals              SLP Short Term Goals - 02/16/21 1612      SLP SHORT TERM GOAL #1   Title Complete formal aphasia assessment    Baseline AQ 70.2/100 - moderate aphasia    Period --   or 3 total sessions   Status Achieved      SLP SHORT TERM GOAL #2   Title pt will produce sentence responses with compensations 80% success    Baseline 70% accurate with min to mod  A    Period --   or 9 total sesions, for all STGs   Status Partially Met      SLP SHORT TERM GOAL #3   Title pt language will improve in order to have pt report communicative effectiveness score to >/= 15    Baseline 13    Status Achieved      SLP SHORT TERM GOAL #4   Title pt will report ability to participate in simple telephone conversations with strangers such as Product manager or other service reps or medical personnel with extra time and compensations    Baseline pt used pre-scripted information for aphasia group and MD apt with reported success    Status Achieved            SLP Long Term Goals - 02/16/21 1613      SLP LONG TERM GOAL #1   Title pt language will improve in order to have pt report communicative effectiveness score to  >/= 20    Baseline 13    Time 14    Period --   visits (19 total), for all LTGs   Status On-going      SLP LONG TERM GOAL #2   Title pt will produce functional communication to communicate simple messages salient for pt in 3 sessions with modified independence (compensations)    Baseline 02-04-21, 02-16-21    Time 14    Status On-going      SLP LONG TERM GOAL #3   Title pt will demonstrate understanding of longer auditory messages with modified independence (compensations) such as sermons    Baseline note taking    Time 14    Status On-going            Plan - 02/16/21 1611    Clinical Impression Statement 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 Pt continues to report difficulty with speech production and word finding impacting her day to day life. Pt cont to progress towards goals. She :loved" the aphasia group (virtual) from [facility]. SLP recommends continuation of skilled ST to maximize communication effectiveness, improve QOL, and aid in return to PLOF.    Speech Therapy Frequency 2x / week    Duration 8 weeks    Treatment/Interventions Compensatory strategies;Patient/family education;Cueing hierarchy;SLP instruction and feedback;Compensatory techniques;Functional tasks;Multimodal communcation approach    Potential to Achieve Goals Good    SLP Home Exercise Plan HEP, compensations    Consulted and Agree with Plan of Care Patient           Patient will benefit from skilled therapeutic intervention in order to improve the following deficits and impairments:   Aphasia    Problem List Patient Active Problem List   Diagnosis Date Noted  . Ptosis of left eyelid 09/18/2019  . Speech disturbance 02/28/2019  . Paresthesia 02/28/2019  . Cerebrovascular accident ([place]) 11/02/2017  . Cerebrovascular accident (CVA) due to thrombosis of anterior cerebral artery ([facility])   . History of TTP (thrombotic thrombocytopenic purpura)   . Cerebral embolism  with cerebral infarction 08/17/2017  . Numbness on left side 08/16/2017  . Acute left lumbar radiculopathy 10/13/2016  . Chronic migraine 06/01/2016  . Pseudotumor cerebri 11/03/2015  . DENTAL PAIN 06/09/2010  . HEADACHE, TENSION 02/04/2010  . SCABIES 01/13/2010  . FOOT PAIN, LEFT 01/13/2010  . CERUMEN IMPACTION, BILATERAL 11/24/2009  . PSORIASIS 11/24/2009  . Smoker 10/15/2009  . 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 PYELONEPHRITIS 10/01/2009  . THROMBOTIC THROMBOCYTOPENIC PURPURA 12/13/2005  [facility] ,[place], [place]  02/16/2021, 4:14 PM  [facility] [address] [place], [place], [address] Phone: [phone]   Fax:  [phone]   Name: Krista Maddox MRN: [id number] Date of Birth: 28-Sep-1986

## 2021-02-23 ENCOUNTER — Other Ambulatory Visit: Payer: Self-pay

## 2021-02-23 ENCOUNTER — Ambulatory Visit: Payer: Medicare Other

## 2021-02-23 DIAGNOSIS — R4701 Aphasia: Secondary | ICD-10-CM

## 2021-02-23 NOTE — Therapy (Signed)
[facility] [address] [place], [place], [address] Phone: [phone]   Fax:  [phone]  Speech Language Pathology Treatment  Patient Details  Name: Krista Maddox MRN: [id number] Date of Birth: May 30, 1986 Referring Provider (SLP): Suzanna Obey, MD   Encounter Date: 02/23/2021   End of Session - 02/23/21 1419    Visit Number 12    Number of Visits 19    Date for SLP Re-Evaluation 03/19/21    Authorization Type medicaid    Authorization Time Period 01/02/21-02/04/21    Authorization - Visit Number 11    Authorization - Number of Visits 59    SLP Start Time 1015    SLP Stop Time  1100    SLP Time Calculation (min) 45 min    Activity Tolerance Patient tolerated treatment well           Past Medical History:  Diagnosis Date  . DDD (degenerative disc disease), lumbar   . Headache   . Other disorders of optic nerve, not elsewhere classified, unspecified eye   . Stroke ([facility])   . TTP (thrombotic thrombocytopenic purpura)     Past Surgical History:  Procedure Laterality Date  . CESAREAN SECTION    . CHOLECYSTECTOMY    . TEE WITHOUT CARDIOVERSION N/A 08/22/2017   Procedure: TRANSESOPHAGEAL ECHOCARDIOGRAM (TEE);  Surgeon: Skeet Latch, MD;  Location: [facility];  Service: Cardiovascular;  Laterality: N/A;  . WISDOM TOOTH EXTRACTION      There were no vitals filed for this visit.   Subjective Assessment - 02/23/21 1016    Subjective "this whole weekend...(pause) it was a lot"    Currently in Pain? Yes    Pain Score 8     Pain Location Head    Pain Descriptors / Indicators Throbbing    Pain Type Acute pain    Pain Onset In the past 7 days                 ADULT SLP TREATMENT - 02/23/21 1021      General Information   Behavior/Cognition Alert;Cooperative;Pleasant mood      Treatment Provided   Treatment provided Cognitive-Linquistic      Cognitive-Linquistic Treatment   Treatment  focused on Aphasia;Patient/family/caregiver education    Skilled Treatment Pt demonstrated intermittent pausing in opening conversation, in which pt able to compensate for word finding ~85% of opportunities in mod complex convo. Previous SLP recommended pt write description of favorite [employer] ride, in which pt able to provide verbal description with no written aid utilized. Overall adequate verbal description provided. Pt stated "I couldn't write stomach yesterday, so I just put bug." SLP used multimodal communication to aid word finding, which was occasionally effective. Mild to mod A for spelling this session (ex: SLP prompting to write "N", with pt kept verbalizing "a?"). Pt described upcoming PFO surgery with min to mod prompting for details. SLP recommends continuing to practice speech and scripting at home.      Assessment / Recommendations / Plan   Plan Continue with current plan of care      Progression Toward Goals   Progression toward goals Progressing toward goals            SLP Education - 02/23/21 1052    Education Details pre-practicing ordering at [employer]) Educated Patient    Methods Explanation;Demonstration    Comprehension Verbalized understanding;Returned demonstration            SLP Short Term  Goals - 02/23/21 1014      SLP SHORT TERM GOAL #1   Title Complete formal aphasia assessment    Baseline AQ 70.2/100 - moderate aphasia    Period --   or 3 total sessions   Status Achieved      SLP SHORT TERM GOAL #2   Title pt will produce sentence responses with compensations 80% success    Baseline 70% accurate with min to mod A    Period --   or 9 total sesions, for all STGs   Status Partially Met      SLP SHORT TERM GOAL #3   Title pt language will improve in order to have pt report communicative effectiveness score to >/= 15    Baseline 13    Status Achieved      SLP SHORT TERM GOAL #4   Title pt will report ability to participate in simple  telephone conversations with strangers such as Product manager or other service reps or medical personnel with extra time and compensations    Baseline pt used pre-scripted information for aphasia group and MD apt with reported success    Status Achieved            SLP Long Term Goals - 02/23/21 1014      SLP LONG TERM GOAL #1   Title pt language will improve in order to have pt report communicative effectiveness score to >/= 20    Baseline 13    Time 13    Period --   visits (19 total), for all LTGs   Status On-going      SLP LONG TERM GOAL #2   Title pt will produce functional communication to communicate simple messages salient for pt in 3 sessions with modified independence (compensations)    Baseline 02-04-21, 02-16-21    Time 13    Status On-going      SLP LONG TERM GOAL #3   Title pt will demonstrate understanding of longer auditory messages with modified independence (compensations) such as sermons    Baseline note taking    Time 13    Status On-going            Plan - 02/23/21 1054    Clinical Impression Statement 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 SLP targeted use of mulitmodal communication with written cues, which was intermittently effective. Pt reports she uses scripting, practicing menus items prior to entering restaurants, use of apps for mobile ordering, and use of texting to aid communication. SLP recommended pt continue to practice speaking and narrating tasks at home. SLP recommends continuation of skilled ST to maximize communication effectiveness, improve QOL, and aid in return to PLOF.    Speech Therapy Frequency 2x / week    Duration 8 weeks    Treatment/Interventions Compensatory strategies;Patient/family education;Cueing hierarchy;SLP instruction and feedback;Compensatory techniques;Functional tasks;Multimodal communcation approach    Potential to Achieve Goals Good    SLP Home Exercise Plan HEP,  compensations    Consulted and Agree with Plan of Care Patient           Patient will benefit from skilled therapeutic intervention in order to improve the following deficits and impairments:   Aphasia    Problem List Patient Active Problem List   Diagnosis Date Noted  . Ptosis of left eyelid 09/18/2019  . Speech disturbance 02/28/2019  . Paresthesia 02/28/2019  . Cerebrovascular accident ([place]) 11/02/2017  . Cerebrovascular accident (CVA) due to thrombosis of  anterior cerebral artery ([place])   . History of TTP (thrombotic thrombocytopenic purpura)   . 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 Numbness on left side 08/16/2017  . Acute left lumbar radiculopathy 10/13/2016  . Chronic migraine 06/01/2016  . Pseudotumor cerebri 11/03/2015  . DENTAL PAIN 06/09/2010  . HEADACHE, TENSION 02/04/2010  . SCABIES 01/13/2010  . FOOT PAIN, LEFT 01/13/2010  . CERUMEN IMPACTION, BILATERAL 11/24/2009  . PSORIASIS 11/24/2009  . Smoker 10/15/2009  . 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 PYELONEPHRITIS 10/01/2009  . THROMBOTIC THROMBOCYTOPENIC PURPURA 12/13/2005    Alinda Deem, MA CCC-SLP 02/23/2021, 2:22 PM  [facility] [address] [place], [place], [address] Phone: [phone]   Fax:  [phone]   Name: Krista Maddox MRN: [id number] Date of Birth: 1986/03/06

## 2021-03-02 ENCOUNTER — Ambulatory Visit: Payer: Medicare Other

## 2021-03-09 ENCOUNTER — Ambulatory Visit: Payer: Medicare Other

## 2021-03-09 ENCOUNTER — Other Ambulatory Visit: Payer: Self-pay

## 2021-03-09 DIAGNOSIS — R4701 Aphasia: Secondary | ICD-10-CM

## 2021-03-09 NOTE — Therapy (Addendum)
[facility] [address] [place], [place], [address] Phone: [phone]   Fax:  [phone]  Speech Language Pathology Treatment  Patient Details  Name: Krista Maddox MRN: [id number] Date of Birth: 20-Oct-1986 Referring Provider (SLP): Suzanna Obey, MD   Encounter Date: 03/09/2021   End of Session - 03/09/21 1100    Visit Number 13    Number of Visits 19    Date for SLP Re-Evaluation 03/19/21    Authorization Type medicaid    Authorization Time Period 01/02/21-02/04/21    Authorization - Visit Number 12    Authorization - Number of Visits 19    SLP Start Time 1030    SLP Stop Time  1100    SLP Time Calculation (min) 30 min    Activity Tolerance Patient tolerated treatment well           Past Medical History:  Diagnosis Date  . DDD (degenerative disc disease), lumbar   . Headache   . Other disorders of optic nerve, not elsewhere classified, unspecified eye   . Stroke ([facility])   . TTP (thrombotic thrombocytopenic purpura)     Past Surgical History:  Procedure Laterality Date  . CESAREAN SECTION    . CHOLECYSTECTOMY    . TEE WITHOUT CARDIOVERSION N/A 08/22/2017   Procedure: TRANSESOPHAGEAL ECHOCARDIOGRAM (TEE);  Surgeon: Skeet Latch, MD;  Location: [facility];  Service: Cardiovascular;  Laterality: N/A;  . WISDOM TOOTH EXTRACTION      There were no vitals filed for this visit.   Subjective Assessment - 03/09/21 1033    Subjective "This just - dropped on [employer]. It's - expensive."    Currently in Pain? No/denies                 ADULT SLP TREATMENT - 03/09/21 1034      General Information   Behavior/Cognition Alert;Cooperative;Pleasant mood      Treatment Provided   Treatment provided Cognitive-Linquistic      Cognitive-Linquistic Treatment   Treatment focused on Aphasia    Skilled Treatment pt 15 minutes late. SLP engaged pt in 20 minutes mostly mod complex conversation - pt was  functional with extra time. SLP had to compensate x3 but did so successfully. Pt is definitely more succesful with verbal expression than previous session with this SLP.      Assessment / Recommendations / Plan   Plan Continue with current plan of care      Progression Toward Goals   Progression toward goals Progressing toward goals            SLP Education - 03/09/21 1106    Education Details atphasia association seminar for parenting with aphasia    Person(s) Educated Patient    Methods Explanation   email   Comprehension Verbalized understanding            SLP Short Term Goals - 02/23/21 1014      SLP SHORT TERM GOAL #1   Title Complete formal aphasia assessment    Baseline AQ 70.2/100 - moderate aphasia    Period --   or 3 total sessions   Status Achieved      SLP SHORT TERM GOAL #2   Title pt will produce sentence responses with compensations 80% success    Baseline 70% accurate with min to mod A    Period --   or 9 total sesions, for all STGs   Status Partially Met      SLP SHORT TERM  GOAL #3   Title pt language will improve in order to have pt report communicative effectiveness score to >/= 15    Baseline 13    Status Achieved      SLP SHORT TERM GOAL #4   Title pt will report ability to participate in simple telephone conversations with strangers such as Product manager or other service reps or medical personnel with extra time and compensations    Baseline pt used pre-scripted information for aphasia group and MD apt with reported success    Status Achieved            SLP Long Term Goals - 03/09/21 1101      SLP LONG TERM GOAL #1   Title pt language will improve in order to have pt report communicative effectiveness score to >/= 20    Baseline 13    Time 12    Period --   visits (19 total), for all LTGs   Status On-going      SLP LONG TERM GOAL #2   Title pt will produce functional communication to communicate simple messages salient for  pt in 3 sessions with modified independence (compensations)    Baseline 02-04-21, 02-16-21    Status Achieved      SLP LONG TERM GOAL #3   Title pt will demonstrate understanding of longer auditory messages with modified independence (compensations) such as sermons    Baseline note taking    Time 12    Status On-going            Plan - 03/09/21 1101    Clinical Impression Statement 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 SLP targeted use of mulitmodal communication with written cues, which was intermittently effective. Pt reports she uses scripting, practicing menus items prior to entering restaurants, use of apps for mobile ordering, and use of texting to aid communication. SLP recommended pt continue to practice speaking and narrating tasks at home. SLP recommends continuation of skilled ST to maximize communication effectiveness, improve QOL, and aid in return to PLOF.    Speech Therapy Frequency 2x / week    Duration 8 weeks    Treatment/Interventions Compensatory strategies;Patient/family education;Cueing hierarchy;SLP instruction and feedback;Compensatory techniques;Functional tasks;Multimodal communcation approach    Potential to Achieve Goals Good    SLP Home Exercise Plan HEP, compensations    Consulted and Agree with Plan of Care Patient           Patient will benefit from skilled therapeutic intervention in order to improve the following deficits and impairments:   Aphasia    Problem List Patient Active Problem List   Diagnosis Date Noted  . Ptosis of left eyelid 09/18/2019  . Speech disturbance 02/28/2019  . Paresthesia 02/28/2019  . Cerebrovascular accident ([facility]) 11/02/2017  . Cerebrovascular accident (CVA) due to thrombosis of anterior cerebral artery ([facility])   . History of TTP (thrombotic thrombocytopenic purpura)   . 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 Numbness on left side 08/16/2017  . Acute left lumbar  radiculopathy 10/13/2016  . Chronic migraine 06/01/2016  . Pseudotumor cerebri 11/03/2015  . DENTAL PAIN 06/09/2010  . HEADACHE, TENSION 02/04/2010  . SCABIES 01/13/2010  . FOOT PAIN, LEFT 01/13/2010  . CERUMEN IMPACTION, BILATERAL 11/24/2009  . PSORIASIS 11/24/2009  . Smoker 10/15/2009  . 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 PYELONEPHRITIS 10/01/2009  . THROMBOTIC THROMBOCYTOPENIC PURPURA 12/13/2005    SCHINKE,CARL ,MS, CCC-SLP  03/09/2021, 11:07 AM  [facility]  [place], [place], [address] Phone: [phone]   Fax:  [phone]   Name: Krista Maddox MRN: [id number] Date of Birth: 12-21-1985

## 2021-03-16 ENCOUNTER — Ambulatory Visit: Payer: Medicare Other | Attending: Family Medicine

## 2021-03-16 ENCOUNTER — Other Ambulatory Visit: Payer: Self-pay

## 2021-03-16 DIAGNOSIS — R4701 Aphasia: Secondary | ICD-10-CM | POA: Diagnosis not present

## 2021-03-16 NOTE — Therapy (Signed)
[facility] [address] [place], [place], [address] Phone: [phone]   Fax:  [phone]  Speech Language Pathology Treatment/Renewal summary  Patient Details  Name: Krista Maddox MRN: [id number] Date of Birth: 1986/09/14 Referring Provider (SLP): Suzanna Obey, MD   Encounter Date: 03/16/2021   End of Session - 03/16/21 1054    Visit Number 14    Number of Visits 19    Date for SLP Re-Evaluation 05/01/21    Authorization Type medicaid    Authorization - Visit Number 12    Authorization - Number of Visits 19    SLP Start Time 9323    SLP Stop Time  1100    SLP Time Calculation (min) 43 min    Activity Tolerance Patient tolerated treatment well           Past Medical History:  Diagnosis Date  . DDD (degenerative disc disease), lumbar   . Headache   . Other disorders of optic nerve, not elsewhere classified, unspecified eye   . Stroke ([place])   . TTP (thrombotic thrombocytopenic purpura)     Past Surgical History:  Procedure Laterality Date  . CESAREAN SECTION    . CHOLECYSTECTOMY    . TEE WITHOUT CARDIOVERSION N/A 08/22/2017   Procedure: TRANSESOPHAGEAL ECHOCARDIOGRAM (TEE);  Surgeon: Skeet Latch, MD;  Location: [facility];  Service: Cardiovascular;  Laterality: N/A;  . WISDOM TOOTH EXTRACTION      There were no vitals filed for this visit.   Subjective Assessment - 03/16/21 1028    Subjective "There are no more [employer] - - stores in the whole state"    Currently in Pain? No/denies                 ADULT SLP TREATMENT - 03/16/21 1031      General Information   Behavior/Cognition Alert;Cooperative;Pleasant mood      Treatment Provided   Treatment provided Cognitive-Linquistic      Cognitive-Linquistic Treatment   Treatment focused on Aphasia    Skilled Treatment SLP engaged pt in thearpeutic conversation for analysis and assistance with mod complex conversation. SLP skillfully  noted pt using compensations of her phone (successful) and descripiton (successful with rare revisions), along with consistent extra time. Pt functional with expressive verbal language today in 20 minutes conversation. Pt would like to schedule out 5 more visits.      Assessment / Recommendations / Plan   Plan Continue with current plan of care      Progression Toward Goals   Progression toward goals Progressing toward goals              SLP Short Term Goals - 02/23/21 1014      SLP SHORT TERM GOAL #1   Title Complete formal aphasia assessment    Baseline AQ 70.2/100 - moderate aphasia    Period --   or 3 total sessions   Status Achieved      SLP SHORT TERM GOAL #2   Title pt will produce sentence responses with compensations 80% success    Baseline 70% accurate with min to mod A    Period --   or 9 total sesions, for all STGs   Status Partially Met      SLP SHORT TERM GOAL #3   Title pt language will improve in order to have pt report communicative effectiveness score to >/= 15    Baseline 13    Status Achieved      SLP  SHORT TERM GOAL #4   Title pt will report ability to participate in simple telephone conversations with strangers such as Product manager or other service reps or medical personnel with extra time and compensations    Baseline pt used pre-scripted information for aphasia group and MD apt with reported success    Status Achieved            SLP Long Term Goals - 03/16/21 1237      SLP LONG TERM GOAL #1   Title pt language will improve in order to have pt report communicative effectiveness score to >/= 20    Baseline 13    Time 19    Period --   visits (19 total), for all LTGs   Status On-going      SLP LONG TERM GOAL #2   Title pt will produce functional communication to communicate simple messages salient for pt in 3 sessions with modified independence (compensations)    Baseline 02-04-21, 02-16-21    Status Achieved      SLP LONG TERM  GOAL #3   Title pt will demonstrate understanding of longer auditory messages with modified independence (compensations) such as sermons    Baseline note taking    Time 19    Period --   total visits   Status On-going      SLP LONG TERM GOAL #4   Title pt will participate functionally in 10 mintues mod complex-complex conversation over 3 sessions    Baseline 03-16-21    Time 19    Period --   total visits   Status New            Plan - 03/16/21 1055    Clinical Impression Statement 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 Pt's skills are improving as she was functional in mod complex conversation for 20 minutes using compenations spontaneously. Pt reports she also continues to use scripting and  use of apps for mobile ordering, and use of texting to aid communication. SLP recommended pt continue to practice speaking and narrating tasks at home. SLP recommends continuation of skilled ST to maximize communication effectiveness, improve QOL, and aid in return to PLOF. Pt would like to cont for 5 more visits, once a week.    Speech Therapy Frequency 1x /week    Duration --   5 more weeks - total 19 visits   Treatment/Interventions Compensatory strategies;Patient/family education;Cueing hierarchy;SLP instruction and feedback;Compensatory techniques;Functional tasks;Multimodal communcation approach    Potential to Achieve Goals Good    SLP Home Exercise Plan HEP, compensations    Consulted and Agree with Plan of Care Patient           Patient will benefit from skilled therapeutic intervention in order to improve the following deficits and impairments:   Aphasia    Problem List Patient Active Problem List   Diagnosis Date Noted  . Ptosis of left eyelid 09/18/2019  . Speech disturbance 02/28/2019  . Paresthesia 02/28/2019  . Cerebrovascular accident ([facility]) 11/02/2017  . Cerebrovascular accident (CVA) due to thrombosis of anterior cerebral artery  ([place])   . History of TTP (thrombotic thrombocytopenic purpura)   . 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 Numbness on left side 08/16/2017  . Acute left lumbar radiculopathy 10/13/2016  . Chronic migraine 06/01/2016  . Pseudotumor cerebri 11/03/2015  . DENTAL PAIN 06/09/2010  . HEADACHE, TENSION 02/04/2010  . SCABIES 01/13/2010  . FOOT PAIN, LEFT 01/13/2010  . CERUMEN  IMPACTION, BILATERAL 11/24/2009  . PSORIASIS 11/24/2009  . Smoker 10/15/2009  . 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 PYELONEPHRITIS 10/01/2009  . THROMBOTIC THROMBOCYTOPENIC PURPURA 12/13/2005    [facility] ,MS, CCC-SLP  03/16/2021, 12:39 PM  [facility] [address] [place], [place], [address] Phone: [phone]   Fax:  [phone]   Name: Krista Maddox MRN: [id number] Date of Birth: [birth year]

## 2021-03-31 ENCOUNTER — Other Ambulatory Visit: Payer: Self-pay

## 2021-03-31 ENCOUNTER — Ambulatory Visit: Payer: Medicare Other

## 2021-03-31 DIAGNOSIS — I63511 Cerebral infarction due to unspecified occlusion or stenosis of right middle cerebral artery: Secondary | ICD-10-CM | POA: Diagnosis not present

## 2021-03-31 DIAGNOSIS — R4701 Aphasia: Secondary | ICD-10-CM

## 2021-03-31 NOTE — Therapy (Signed)
[facility] [address] [place], [place], [address] Phone: [phone]   Fax:  [phone]  Speech Language Pathology Treatment  Patient Details  Name: Krista Maddox MRN: [id number] Date of Birth: 11-Apr-1986 Referring Provider (SLP): Suzanna Obey, MD   Encounter Date: 03/31/2021   End of Session - 03/31/21 2346    Visit Number 15    Number of Visits 19    Date for SLP Re-Evaluation 05/01/21    Authorization Type medicaid    Authorization Time Period 01/02/21-02/04/21    Authorization - Visit Number 13    Authorization - Number of Visits 19    SLP Start Time 7062    SLP Stop Time  1100    SLP Time Calculation (min) 42 min    Activity Tolerance Patient tolerated treatment well           Past Medical History:  Diagnosis Date  . DDD (degenerative disc disease), lumbar   . Headache   . Other disorders of optic nerve, not elsewhere classified, unspecified eye   . Stroke ([place])   . TTP (thrombotic thrombocytopenic purpura)     Past Surgical History:  Procedure Laterality Date  . CESAREAN SECTION    . CHOLECYSTECTOMY    . TEE WITHOUT CARDIOVERSION N/A 08/22/2017   Procedure: TRANSESOPHAGEAL ECHOCARDIOGRAM (TEE);  Surgeon: Skeet Latch, MD;  Location: [facility];  Service: Cardiovascular;  Laterality: N/A;  . WISDOM TOOTH EXTRACTION      There were no vitals filed for this visit.   Subjective Assessment - 03/31/21 1027    Subjective "I'm not made for this weather."    Currently in Pain? No/denies                 ADULT SLP TREATMENT - 03/31/21 1029      General Information   Behavior/Cognition Alert;Cooperative;Pleasant mood      Treatment Provided   Treatment provided Cognitive-Linquistic      Cognitive-Linquistic Treatment   Treatment focused on Aphasia    Skilled Treatment "And they just brought back yester - yesterday you can hug your favorite - care- char-ac-ters." Pt reports  texting goes well, but that the end of the day is when most people want to text - SLP suggested that pt ask people to text earlier in the evening. SLP guided Nitika through a mod complex conversation directing her to use compensations fmore consistently - she used approx 40% vague speech predominant, instead of trying a compensation. But even with this, pt's message was conveyed functionally for that 25 minute conversation.SLP reiterated pt could reduce to once/week due to progress and pt agreed.      Assessment / Recommendations / Plan   Plan Continue with current plan of care      Progression Toward Goals   Progression toward goals Progressing toward goals            SLP Education - 03/31/21 2346    Education Details progress in ST    Person(s) Educated Patient    Methods Explanation    Comprehension Verbalized understanding            SLP Short Term Goals - 02/23/21 1014      SLP SHORT TERM GOAL #1   Title Complete formal aphasia assessment    Baseline AQ 70.2/100 - moderate aphasia    Period --   or 3 total sessions   Status Achieved      SLP SHORT TERM GOAL #2  Title pt will produce sentence responses with compensations 80% success    Baseline 70% accurate with min to mod A    Period --   or 9 total sesions, for all STGs   Status Partially Met      SLP SHORT TERM GOAL #3   Title pt language will improve in order to have pt report communicative effectiveness score to >/= 15    Baseline 13    Status Achieved      SLP SHORT TERM GOAL #4   Title pt will report ability to participate in simple telephone conversations with strangers such as Product manager or other service reps or medical personnel with extra time and compensations    Baseline pt used pre-scripted information for aphasia group and MD apt with reported success    Status Achieved            SLP Long Term Goals - 03/31/21 2348      SLP LONG TERM GOAL #1   Title pt language will improve in  order to have pt report communicative effectiveness score to >/= 20    Baseline 13    Time 19    Period --   visits (19 total), for all LTGs   Status On-going      SLP LONG TERM GOAL #2   Title pt will produce functional communication to communicate simple messages salient for pt in 3 sessions with modified independence (compensations)    Baseline 02-04-21, 02-16-21    Status Achieved      SLP LONG TERM GOAL #3   Title pt will demonstrate understanding of longer auditory messages with modified independence (compensations) such as sermons    Baseline note taking    Time 19    Period --   total visits   Status On-going      SLP LONG TERM GOAL #4   Title pt will participate functionally in 10 mintues mod complex-complex conversation over 3 sessions    Baseline 03-16-21    Time 19    Period --   total visits   Status On-going            Plan - 03/31/21 2347    Clinical Impression Statement 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 Pt's skills continue to improve as she was functional in mod complex conversation for 25 minutes using compenations 50% of the time - using more vague speech today than in previous session. Pt reports she also continues to use scripting and  use of apps for mobile ordering, and use of texting to aid communication. SLP recommended pt continue to practice speaking and narrating tasks at home. SLP recommends continuation of skilled ST to maximize communication effectiveness, improve QOL, and aid in return to PLOF.    Speech Therapy Frequency 1x /week    Duration --   5 more weeks - total 19 visits   Treatment/Interventions Compensatory strategies;Patient/family education;Cueing hierarchy;SLP instruction and feedback;Compensatory techniques;Functional tasks;Multimodal communcation approach    Potential to Achieve Goals Good    SLP Home Exercise Plan HEP, compensations    Consulted and Agree with Plan of Care Patient           Patient will  benefit from skilled therapeutic intervention in order to improve the following deficits and impairments:   Aphasia    Problem List Patient Active Problem List   Diagnosis Date Noted  . Ptosis of left eyelid 09/18/2019  . Speech disturbance 02/28/2019  . Paresthesia  02/28/2019  . Cerebrovascular accident ([facility]) 11/02/2017  . 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 History of TTP (thrombotic thrombocytopenic purpura)   . 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 Numbness on left side 08/16/2017  . Acute left lumbar radiculopathy 10/13/2016  . Chronic migraine 06/01/2016  . Pseudotumor cerebri 11/03/2015  . DENTAL PAIN 06/09/2010  . HEADACHE, TENSION 02/04/2010  . SCABIES 01/13/2010  . FOOT PAIN, LEFT 01/13/2010  . CERUMEN IMPACTION, BILATERAL 11/24/2009  . PSORIASIS 11/24/2009  . Smoker 10/15/2009  . 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 PYELONEPHRITIS 10/01/2009  . THROMBOTIC THROMBOCYTOPENIC PURPURA 12/13/2005    [facility] ,MS, CCC-SLP  03/31/2021, 11:50 PM  [facility] [address] [place], [place], [address] Phone: [phone]   Fax:  [phone]   Name: EMILLEE TALSMA MRN: [id number] Date of Birth: [birth year]

## 2021-04-02 ENCOUNTER — Encounter (HOSPITAL_COMMUNITY): Payer: Self-pay | Admitting: Emergency Medicine

## 2021-04-02 ENCOUNTER — Emergency Department (HOSPITAL_COMMUNITY): Payer: Medicare Other

## 2021-04-02 ENCOUNTER — Emergency Department (HOSPITAL_COMMUNITY): Payer: Medicare Other | Admitting: Certified Registered Nurse Anesthetist

## 2021-04-02 ENCOUNTER — Encounter (HOSPITAL_COMMUNITY)
Admission: EM | Disposition: A | Discharge status: Rehabilitation Facility | Payer: Self-pay | Source: Home / Self Care | Attending: Neurology

## 2021-04-02 ENCOUNTER — Inpatient Hospital Stay (HOSPITAL_COMMUNITY)
Admission: EM | Admit: 2021-04-02 | Discharge: 2021-05-15 | DRG: 023 | Disposition: A | Discharge status: Rehabilitation Facility | Payer: Medicare Other | Attending: Neurology | Admitting: Neurology

## 2021-04-02 DIAGNOSIS — Z79899 Other long term (current) drug therapy: Secondary | ICD-10-CM | POA: Diagnosis not present

## 2021-04-02 DIAGNOSIS — Z833 Family history of diabetes mellitus: Secondary | ICD-10-CM | POA: Diagnosis not present

## 2021-04-02 DIAGNOSIS — R471 Dysarthria and anarthria: Secondary | ICD-10-CM | POA: Diagnosis present

## 2021-04-02 DIAGNOSIS — G932 Benign intracranial hypertension: Secondary | ICD-10-CM | POA: Diagnosis not present

## 2021-04-02 DIAGNOSIS — I6601 Occlusion and stenosis of right middle cerebral artery: Secondary | ICD-10-CM | POA: Diagnosis present

## 2021-04-02 DIAGNOSIS — Z7901 Long term (current) use of anticoagulants: Secondary | ICD-10-CM | POA: Diagnosis not present

## 2021-04-02 DIAGNOSIS — Z751 Person awaiting admission to adequate facility elsewhere: Secondary | ICD-10-CM

## 2021-04-02 DIAGNOSIS — G9340 Encephalopathy, unspecified: Secondary | ICD-10-CM | POA: Diagnosis not present

## 2021-04-02 DIAGNOSIS — R21 Rash and other nonspecific skin eruption: Secondary | ICD-10-CM | POA: Diagnosis present

## 2021-04-02 DIAGNOSIS — Z8249 Family history of ischemic heart disease and other diseases of the circulatory system: Secondary | ICD-10-CM | POA: Diagnosis not present

## 2021-04-02 DIAGNOSIS — I63511 Cerebral infarction due to unspecified occlusion or stenosis of right middle cerebral artery: Secondary | ICD-10-CM | POA: Diagnosis present

## 2021-04-02 DIAGNOSIS — G4733 Obstructive sleep apnea (adult) (pediatric): Secondary | ICD-10-CM | POA: Diagnosis present

## 2021-04-02 DIAGNOSIS — Z20822 Contact with and (suspected) exposure to covid-19: Secondary | ICD-10-CM | POA: Diagnosis present

## 2021-04-02 DIAGNOSIS — I609 Nontraumatic subarachnoid hemorrhage, unspecified: Secondary | ICD-10-CM | POA: Diagnosis present

## 2021-04-02 DIAGNOSIS — I69320 Aphasia following cerebral infarction: Secondary | ICD-10-CM | POA: Diagnosis not present

## 2021-04-02 DIAGNOSIS — R1310 Dysphagia, unspecified: Secondary | ICD-10-CM | POA: Diagnosis present

## 2021-04-02 DIAGNOSIS — R29810 Facial weakness: Secondary | ICD-10-CM | POA: Diagnosis present

## 2021-04-02 DIAGNOSIS — M1990 Unspecified osteoarthritis, unspecified site: Secondary | ICD-10-CM | POA: Diagnosis present

## 2021-04-02 DIAGNOSIS — F17200 Nicotine dependence, unspecified, uncomplicated: Secondary | ICD-10-CM | POA: Diagnosis present

## 2021-04-02 DIAGNOSIS — F32A Depression, unspecified: Secondary | ICD-10-CM | POA: Diagnosis not present

## 2021-04-02 DIAGNOSIS — I69351 Hemiplegia and hemiparesis following cerebral infarction affecting right dominant side: Secondary | ICD-10-CM | POA: Diagnosis not present

## 2021-04-02 DIAGNOSIS — R29724 NIHSS score 24: Secondary | ICD-10-CM | POA: Diagnosis present

## 2021-04-02 DIAGNOSIS — I6340 Cerebral infarction due to embolism of unspecified cerebral artery: Secondary | ICD-10-CM | POA: Diagnosis not present

## 2021-04-02 DIAGNOSIS — R000 Tachycardia, unspecified: Secondary | ICD-10-CM | POA: Diagnosis not present

## 2021-04-02 DIAGNOSIS — R1312 Dysphagia, oropharyngeal phase: Secondary | ICD-10-CM | POA: Diagnosis not present

## 2021-04-02 DIAGNOSIS — D72829 Elevated white blood cell count, unspecified: Secondary | ICD-10-CM | POA: Diagnosis not present

## 2021-04-02 DIAGNOSIS — I639 Cerebral infarction, unspecified: Secondary | ICD-10-CM | POA: Diagnosis present

## 2021-04-02 DIAGNOSIS — Z8041 Family history of malignant neoplasm of ovary: Secondary | ICD-10-CM

## 2021-04-02 DIAGNOSIS — H53462 Homonymous bilateral field defects, left side: Secondary | ICD-10-CM | POA: Diagnosis present

## 2021-04-02 DIAGNOSIS — E785 Hyperlipidemia, unspecified: Secondary | ICD-10-CM | POA: Diagnosis present

## 2021-04-02 DIAGNOSIS — Z6841 Body Mass Index (BMI) 40.0 and over, adult: Secondary | ICD-10-CM | POA: Diagnosis not present

## 2021-04-02 DIAGNOSIS — J9811 Atelectasis: Secondary | ICD-10-CM | POA: Diagnosis present

## 2021-04-02 DIAGNOSIS — R509 Fever, unspecified: Secondary | ICD-10-CM | POA: Diagnosis not present

## 2021-04-02 DIAGNOSIS — R414 Neurologic neglect syndrome: Secondary | ICD-10-CM | POA: Diagnosis present

## 2021-04-02 DIAGNOSIS — Z8673 Personal history of transient ischemic attack (TIA), and cerebral infarction without residual deficits: Secondary | ICD-10-CM | POA: Diagnosis not present

## 2021-04-02 DIAGNOSIS — I10 Essential (primary) hypertension: Secondary | ICD-10-CM | POA: Diagnosis not present

## 2021-04-02 DIAGNOSIS — G43909 Migraine, unspecified, not intractable, without status migrainosus: Secondary | ICD-10-CM | POA: Diagnosis present

## 2021-04-02 DIAGNOSIS — G8114 Spastic hemiplegia affecting left nondominant side: Secondary | ICD-10-CM | POA: Diagnosis present

## 2021-04-02 DIAGNOSIS — H50112 Monocular exotropia, left eye: Secondary | ICD-10-CM | POA: Diagnosis present

## 2021-04-02 DIAGNOSIS — E876 Hypokalemia: Secondary | ICD-10-CM | POA: Diagnosis not present

## 2021-04-02 DIAGNOSIS — J189 Pneumonia, unspecified organism: Secondary | ICD-10-CM

## 2021-04-02 DIAGNOSIS — G43709 Chronic migraine without aura, not intractable, without status migrainosus: Secondary | ICD-10-CM | POA: Diagnosis not present

## 2021-04-02 HISTORY — PX: IR PERCUTANEOUS ART THROMBECTOMY/INFUSION INTRACRANIAL INC DIAG ANGIO: IMG6087

## 2021-04-02 HISTORY — PX: IR CT HEAD LTD: IMG2386

## 2021-04-02 HISTORY — PX: RADIOLOGY WITH ANESTHESIA: SHX6223

## 2021-04-02 LAB — DIFFERENTIAL
Abs Immature Granulocytes: 0.05 10*3/uL (ref 0.00–0.07)
Basophils Absolute: 0.1 10*3/uL (ref 0.0–0.1)
Basophils Relative: 1 %
Eosinophils Absolute: 0.2 10*3/uL (ref 0.0–0.5)
Eosinophils Relative: 2 %
Immature Granulocytes: 1 %
Lymphocytes Relative: 27 %
Lymphs Abs: 2.7 10*3/uL (ref 0.7–4.0)
Monocytes Absolute: 0.5 10*3/uL (ref 0.1–1.0)
Monocytes Relative: 5 %
Neutro Abs: 6.6 10*3/uL (ref 1.7–7.7)
Neutrophils Relative %: 64 %

## 2021-04-02 LAB — CBC
HCT: 41.8 % (ref 36.0–46.0)
Hemoglobin: 13.1 g/dL (ref 12.0–15.0)
MCH: 29.1 pg (ref 26.0–34.0)
MCHC: 31.3 g/dL (ref 30.0–36.0)
MCV: 92.9 fL (ref 80.0–100.0)
Platelets: 187 10*3/uL (ref 150–400)
RBC: 4.5 MIL/uL (ref 3.87–5.11)
RDW: 14.6 % (ref 11.5–15.5)
WBC: 10.1 10*3/uL (ref 4.0–10.5)
nRBC: 0 % (ref 0.0–0.2)

## 2021-04-02 LAB — MRSA PCR SCREENING: MRSA by PCR: NEGATIVE

## 2021-04-02 LAB — COMPREHENSIVE METABOLIC PANEL
ALT: 14 U/L (ref 0–44)
AST: 18 U/L (ref 15–41)
Albumin: 3.3 g/dL — ABNORMAL LOW (ref 3.5–5.0)
Alkaline Phosphatase: 88 U/L (ref 38–126)
Anion gap: 8 (ref 5–15)
BUN: 11 mg/dL (ref 6–20)
CO2: 21 mmol/L — ABNORMAL LOW (ref 22–32)
Calcium: 8.5 mg/dL — ABNORMAL LOW (ref 8.9–10.3)
Chloride: 108 mmol/L (ref 98–111)
Creatinine, Ser: 0.96 mg/dL (ref 0.44–1.00)
GFR, Estimated: >60 mL/min (ref >60)
Glucose, Bld: 104 mg/dL — ABNORMAL HIGH (ref 70–99)
Potassium: 4 mmol/L (ref 3.5–5.1)
Sodium: 137 mmol/L (ref 135–145)
Total Bilirubin: 0.7 mg/dL (ref 0.3–1.2)
Total Protein: 7 g/dL (ref 6.5–8.1)

## 2021-04-02 LAB — PROTIME-INR
INR: 1.2 (ref 0.8–1.2)
Prothrombin Time: 15.1 seconds (ref 11.4–15.2)

## 2021-04-02 LAB — I-STAT BETA HCG BLOOD, ED (MC, WL, AP ONLY): I-stat hCG, quantitative: 6.3 m[IU]/mL — ABNORMAL HIGH (ref <5)

## 2021-04-02 LAB — I-STAT CHEM 8, ED
BUN: 12 mg/dL (ref 6–20)
Calcium, Ion: 1 mmol/L — ABNORMAL LOW (ref 1.15–1.40)
Chloride: 108 mmol/L (ref 98–111)
Creatinine, Ser: 0.8 mg/dL (ref 0.44–1.00)
Glucose, Bld: 103 mg/dL — ABNORMAL HIGH (ref 70–99)
HCT: 39 % (ref 36.0–46.0)
Hemoglobin: 13.3 g/dL (ref 12.0–15.0)
Potassium: 4.2 mmol/L (ref 3.5–5.1)
Sodium: 138 mmol/L (ref 135–145)
TCO2: 22 mmol/L (ref 22–32)

## 2021-04-02 LAB — HIV ANTIBODY (ROUTINE TESTING W REFLEX): HIV Screen 4th Generation wRfx: NONREACTIVE

## 2021-04-02 LAB — CBG MONITORING, ED: Glucose-Capillary: 110 mg/dL — ABNORMAL HIGH (ref 70–99)

## 2021-04-02 LAB — APTT: aPTT: 29 seconds (ref 24–36)

## 2021-04-02 LAB — RESP PANEL BY RT-PCR (FLU A&B, COVID) ARPGX2
Influenza A by PCR: NEGATIVE
Influenza B by PCR: NEGATIVE
SARS Coronavirus 2 by RT PCR: NEGATIVE

## 2021-04-02 LAB — TSH: TSH: 2.735 u[IU]/mL (ref 0.350–4.500)

## 2021-04-02 SURGERY — IR WITH ANESTHESIA
Anesthesia: General

## 2021-04-02 MED ORDER — SUCCINYLCHOLINE CHLORIDE 200 MG/10ML IV SOSY
PREFILLED_SYRINGE | INTRAVENOUS | Status: DC | PRN
  Administered 2021-04-02: 120 mg via INTRAVENOUS

## 2021-04-02 MED ORDER — TIROFIBAN HCL IN NACL 5-0.9 MG/100ML-% IV SOLN
INTRAVENOUS | Status: AC
  Filled 2021-04-02: qty 100

## 2021-04-02 MED ORDER — SENNOSIDES-DOCUSATE SODIUM 8.6-50 MG PO TABS: 1.0000 | ORAL_TABLET | Freq: Two times a day (BID) | ORAL | Status: DC

## 2021-04-02 MED ORDER — CLOPIDOGREL BISULFATE 300 MG PO TABS
ORAL_TABLET | ORAL | Status: AC
  Filled 2021-04-02: qty 1

## 2021-04-02 MED ORDER — ACETAMINOPHEN 650 MG RE SUPP: 650.0000 mg | RECTAL | Status: DC | PRN

## 2021-04-02 MED ORDER — ROCURONIUM BROMIDE 10 MG/ML (PF) SYRINGE
PREFILLED_SYRINGE | INTRAVENOUS | Status: DC | PRN
  Administered 2021-04-02: 10 mg via INTRAVENOUS
  Administered 2021-04-02: 70 mg via INTRAVENOUS

## 2021-04-02 MED ORDER — ACETAMINOPHEN 160 MG/5ML PO SOLN
650.0000 mg | ORAL | Status: DC | PRN
  Administered 2021-04-04 – 2021-05-12 (×13): 650 mg
  Filled 2021-04-02 (×13): qty 20.3

## 2021-04-02 MED ORDER — ACETAMINOPHEN 325 MG PO TABS: 650.0000 mg | ORAL_TABLET | ORAL | Status: DC | PRN

## 2021-04-02 MED ORDER — FENTANYL CITRATE (PF) 100 MCG/2ML IJ SOLN
INTRAMUSCULAR | Status: AC
  Filled 2021-04-02: qty 2

## 2021-04-02 MED ORDER — ONDANSETRON HCL 4 MG/2ML IJ SOLN
INTRAMUSCULAR | Status: DC | PRN
  Administered 2021-04-02: 4 mg via INTRAVENOUS

## 2021-04-02 MED ORDER — CEFAZOLIN SODIUM-DEXTROSE 2-3 GM-%(50ML) IV SOLR
INTRAVENOUS | Status: DC | PRN
  Administered 2021-04-02: 2 g via INTRAVENOUS

## 2021-04-02 MED ORDER — PHENYLEPHRINE HCL-NACL 10-0.9 MG/250ML-% IV SOLN
INTRAVENOUS | Status: DC | PRN
  Administered 2021-04-02: 30 ug/min via INTRAVENOUS

## 2021-04-02 MED ORDER — DEXAMETHASONE SODIUM PHOSPHATE 10 MG/ML IJ SOLN
INTRAMUSCULAR | Status: DC | PRN
  Administered 2021-04-02: 10 mg via INTRAVENOUS

## 2021-04-02 MED ORDER — ACETAMINOPHEN 160 MG/5ML PO SOLN: 650.0000 mg | ORAL | Status: DC | PRN

## 2021-04-02 MED ORDER — CLEVIDIPINE BUTYRATE 0.5 MG/ML IV EMUL
INTRAVENOUS | Status: AC
  Administered 2021-04-03: 13 mg/h via INTRAVENOUS
  Filled 2021-04-02: qty 50

## 2021-04-02 MED ORDER — ASPIRIN 81 MG PO CHEW
CHEWABLE_TABLET | ORAL | Status: AC
  Filled 2021-04-02: qty 1

## 2021-04-02 MED ORDER — ACETAMINOPHEN 325 MG PO TABS
650.0000 mg | ORAL_TABLET | ORAL | Status: DC | PRN
  Administered 2021-04-09 – 2021-04-27 (×5): 650 mg via ORAL
  Filled 2021-04-02 (×5): qty 2

## 2021-04-02 MED ORDER — TICAGRELOR 90 MG PO TABS
ORAL_TABLET | ORAL | Status: AC
  Filled 2021-04-02: qty 2

## 2021-04-02 MED ORDER — CLEVIDIPINE BUTYRATE 0.5 MG/ML IV EMUL
INTRAVENOUS | Status: DC | PRN
  Administered 2021-04-02: 2 mg/h via INTRAVENOUS

## 2021-04-02 MED ORDER — FENTANYL CITRATE (PF) 250 MCG/5ML IJ SOLN
INTRAMUSCULAR | Status: DC | PRN
  Administered 2021-04-02: 100 ug via INTRAVENOUS

## 2021-04-02 MED ORDER — NITROGLYCERIN 1 MG/10 ML FOR IR/CATH LAB
INTRA_ARTERIAL | Status: AC
  Filled 2021-04-02: qty 10

## 2021-04-02 MED ORDER — CANGRELOR TETRASODIUM 50 MG IV SOLR
INTRAVENOUS | Status: AC
  Filled 2021-04-02: qty 50

## 2021-04-02 MED ORDER — SODIUM CHLORIDE 0.9% FLUSH
3.0000 mL | Freq: Once | INTRAVENOUS | Status: DC
Start: 2021-04-02 — End: 2021-05-16

## 2021-04-02 MED ORDER — IOHEXOL 300 MG/ML  SOLN
150.0000 mL | Freq: Once | INTRAMUSCULAR | Status: AC | PRN
  Administered 2021-04-02: 120 mL via INTRA_ARTERIAL

## 2021-04-02 MED ORDER — SODIUM CHLORIDE 0.9 % IV SOLN: INTRAVENOUS | Status: DC | PRN

## 2021-04-02 MED ORDER — LIDOCAINE 2% (20 MG/ML) 5 ML SYRINGE
INTRAMUSCULAR | Status: DC | PRN
  Administered 2021-04-02: 60 mg via INTRAVENOUS

## 2021-04-02 MED ORDER — PANTOPRAZOLE SODIUM 40 MG IV SOLR
40.0000 mg | Freq: Every day | INTRAVENOUS | Status: DC
  Administered 2021-04-02: 40 mg via INTRAVENOUS
  Filled 2021-04-02: qty 40

## 2021-04-02 MED ORDER — VERAPAMIL HCL 2.5 MG/ML IV SOLN
INTRAVENOUS | Status: AC
  Filled 2021-04-02: qty 2

## 2021-04-02 MED ORDER — CLEVIDIPINE BUTYRATE 0.5 MG/ML IV EMUL
0.0000 mg/h | INTRAVENOUS | Status: DC
  Administered 2021-04-02: 4 mg/h via INTRAVENOUS
  Administered 2021-04-03: 13 mg/h via INTRAVENOUS
  Administered 2021-04-03: 18 mg/h via INTRAVENOUS
  Administered 2021-04-03: 3 mg/h via INTRAVENOUS
  Filled 2021-04-02 (×5): qty 50

## 2021-04-02 MED ORDER — SUGAMMADEX SODIUM 200 MG/2ML IV SOLN
INTRAVENOUS | Status: DC | PRN
  Administered 2021-04-02: 200 mg via INTRAVENOUS

## 2021-04-02 MED ORDER — PROPOFOL 10 MG/ML IV BOLUS
INTRAVENOUS | Status: DC | PRN
  Administered 2021-04-02: 150 mg via INTRAVENOUS

## 2021-04-02 MED ORDER — EPTIFIBATIDE 20 MG/10ML IV SOLN
INTRAVENOUS | Status: AC
  Filled 2021-04-02: qty 10

## 2021-04-02 MED ORDER — IOHEXOL 350 MG/ML SOLN
100.0000 mL | Freq: Once | INTRAVENOUS | Status: AC | PRN
  Administered 2021-04-02: 100 mL via INTRAVENOUS

## 2021-04-02 MED ORDER — SODIUM CHLORIDE 0.9 % IV SOLN: INTRAVENOUS | Status: DC

## 2021-04-02 MED ORDER — NITROGLYCERIN 1 MG/10 ML FOR IR/CATH LAB
INTRA_ARTERIAL | Status: AC | PRN
  Administered 2021-04-02 (×3): 25 ug via INTRA_ARTERIAL

## 2021-04-02 MED ORDER — STROKE: EARLY STAGES OF RECOVERY BOOK
Freq: Once | Status: DC
  Filled 2021-04-02: qty 1

## 2021-04-02 NOTE — ED Triage Notes (Signed)
CODE STROKE  Per EMS pt found left side facial droop, left side weakness, right eye deviation, and aphasia. History of prior strokes. On blood thinner.

## 2021-04-02 NOTE — Anesthesia Procedure Notes (Signed)
Procedure Name: Intubation Date/Time: 04/02/2021 12:40 PM Performed by: Jed Limerick, CRNA Pre-anesthesia Checklist: Patient identified, Emergency Drugs available, Suction available and Patient being monitored Patient Re-evaluated:Patient Re-evaluated prior to induction Oxygen Delivery Method: Circle system utilized Preoxygenation: Pre-oxygenation with 100% oxygen Induction Type: IV induction, Rapid sequence and Cricoid Pressure applied Laryngoscope Size: Glidescope and 3 Grade View: Grade I Tube type: Oral Tube size: 7.5 mm Number of attempts: 1 Airway Equipment and Method: Video-laryngoscopy and Rigid stylet Placement Confirmation: ETT inserted through vocal cords under direct vision,  positive ETCO2 and breath sounds checked- equal and bilateral Secured at: 23 cm Tube secured with: Tape Dental Injury: Teeth and Oropharynx as per pre-operative assessment

## 2021-04-02 NOTE — Procedures (Signed)
S/P RT common carpotid arteriogram followed ny revascularization of occluded RT MCa M1 seg with x3 passes with  Stent retrievers and contact aspiration and x 1 pass with contact aspiration achieving a TICI 2C Revascularization. Post CT brain mod hyperdensity in RT perisylvian fissure . NIO mass effect. 59F angioseal for hemstasis in the Rt CFA puncture site. Distal pulses  All dopplerable. Extubated. Moves  Rt side spontaneously. No movement on the left. Pupils 3 mm Rt = LT  Not following simple instructions appropriately. S.Vallen Calabrese MD

## 2021-04-02 NOTE — Code Documentation (Signed)
Stroke Response Nurse Documentation Code Documentation  Krista Maddox is a 35 y.o. female arriving to [facility] ED via [facility] on 04/02/2021 with past medical hx of four strokes with the last being in December. Code stroke was activated by EMS. Patient from home where she was LKW at 2100 last night when she went to bed and now complaining of left sided weakness, right gaze. On Eliquis (apixaban) daily.   Stroke team at the bedside on patient arrival. Labs drawn and patient cleared for CT by Dr. Rosalia Hammers. Patient to CT with team. NIHSS 23, see documentation for details and code stroke times. Patient with disoriented, not following commands, right gaze preference , left hemianopia, left facial droop, left arm weakness, left leg weakness, Global aphasia  and dysarthria  on exam. The following imaging was completed:  CT, CTA head and neck, CTP. Patient is not a candidate for tPA due to being outside window. Care/Plan: Take patient to IR and admit to ICU. Bedside handoff with IR RN Barbara Cower.    Krista Maddox  Stroke Response RN

## 2021-04-02 NOTE — Anesthesia Procedure Notes (Signed)
Arterial Line Insertion Start/End4/21/2022 12:49 PM Performed by: Alease Medina, CRNA, CRNA  Patient location: Pre-op. Preanesthetic checklist: patient identified, IV checked, site marked, risks and benefits discussed, surgical consent, monitors and equipment checked, pre-op evaluation, timeout performed and anesthesia consent Lidocaine 1% used for infiltration Left, radial was placed Catheter size: 20 G Hand hygiene performed  and maximum sterile barriers used   Attempts: 2 Procedure performed without using ultrasound guided technique. Following insertion, dressing applied and Biopatch. Post procedure assessment: normal and unchanged  Patient tolerated the procedure well with no immediate complications.

## 2021-04-02 NOTE — ED Provider Notes (Signed)
[facility] EMERGENCY DEPARTMENT Provider Note   CSN: [id number] Arrival date & time: 04/02/21  1200     History No chief complaint on file.   Krista Maddox is a 35 y.o. female. Level 5 caveat secondary to acuity and urgent need for intervention HPI  35 year old female history of stroke with aphasia presents today with new onset of left-sided weakness with last known normal yesterday at 1130.  She was seen at the bridge as a code stroke. She had gaze to the right with left sided weakness. Patient went directly to CT scanner She is reported to be on eliquis for  Past Medical History:  Diagnosis Date  . DDD (degenerative disc disease), lumbar   . Headache   . Other disorders of optic nerve, not elsewhere classified, unspecified eye   . Stroke (HCC)   . TTP (thrombotic thrombocytopenic purpura)     Patient Active Problem List   Diagnosis Date Noted  . Ptosis of left eyelid 09/18/2019  . Speech disturbance 02/28/2019  . Paresthesia 02/28/2019  . Cerebrovascular accident (HCC) 11/02/2017  . 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 History of TTP (thrombotic thrombocytopenic purpura)   . 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 Numbness on left side 08/16/2017  . Acute left lumbar radiculopathy 10/13/2016  . Chronic migraine 06/01/2016  . Pseudotumor cerebri 11/03/2015  . DENTAL PAIN 06/09/2010  . HEADACHE, TENSION 02/04/2010  . SCABIES 01/13/2010  . FOOT PAIN, LEFT 01/13/2010  . CERUMEN IMPACTION, BILATERAL 11/24/2009  . PSORIASIS 11/24/2009  . Smoker 10/15/2009  . 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 PYELONEPHRITIS 10/01/2009  . 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 CESAREAN SECTION    . CHOLECYSTECTOMY    . TEE WITHOUT CARDIOVERSION N/A 08/22/2017   Procedure: TRANSESOPHAGEAL ECHOCARDIOGRAM (TEE);  Surgeon:  Chilton Si, MD;  Location: [facility] ENDOSCOPY;  Service: Cardiovascular;  Laterality: N/A;  . WISDOM TOOTH EXTRACTION       OB History   No obstetric history on file.     Family History  Problem Relation Age of Onset  . Diabetes Mother   . Hypertension Mother   . Ovarian cancer Mother        Hysterectomy  . Healthy Father   . Ovarian cancer Maternal Grandmother        Hysterectomy    Social History   Tobacco Use  . Smoking status: Former Smoker    Packs/day: 0.50    Quit date: 08/27/2017    Years since quitting: 3.6  . Smokeless tobacco: Never Used  Substance Use Topics  . Alcohol use: No    Alcohol/week: 0.0 standard drinks  . Drug use: No    Home Medications Prior to Admission medications   Medication Sig Start Date End Date Taking? Authorizing Provider  acetaminophen (TYLENOL) 325 MG tablet Take 650 mg by mouth every 6 (six) hours as needed. Patient not taking: Reported on 12/19/2020    [provider]  apixaban (ELIQUIS) 5 MG TABS tablet Take 5 mg by mouth 2 (two) times daily.    [provider]  aspirin EC 325 MG tablet Take 1 tablet (325 mg total) by mouth daily. Patient not taking: Reported on 12/19/2020 07/27/18   Ihor Austin, NP  atenolol (TENORMIN) 50 MG tablet Take 50 mg by mouth daily.    [provider]  atorvastatin (LIPITOR) 40 MG tablet Take 1 tablet (  40 mg total) by mouth daily. Patient taking differently: Take 80 mg by mouth daily. 07/27/18   Ihor Austin, NP  co-enzyme Q-10 50 MG capsule Take 50 mg by mouth 2 (two) times daily. Patient not taking: Reported on 12/19/2020    [provider]  Erenumab-aooe (AIMOVIG) 140 MG/ML SOAJ Inject 140 mg into the skin every 30 (thirty) days. Patient not taking: Reported on 12/19/2020 04/09/20   Levert Feinstein, MD  ezetimibe (ZETIA) 10 MG tablet Take 10 mg by mouth daily.    [provider]  gabapentin (NEURONTIN) 300 MG capsule TAKE 1 CAPSULE BY MOUTH TWICE DAILY, MAY INCREASE  TO 2 TWICE DAILY AS NEEDED 10/30/19   Levert Feinstein, MD  Magnesium Oxide 400 (240 Mg) MG TABS Take 400 mg by mouth 2 (two) times daily. 06/01/16   Levert Feinstein, MD  meclizine (ANTIVERT) 12.5 MG tablet Take 12.5 mg by mouth 3 (three) times daily as needed for dizziness. Patient not taking: Reported on 12/19/2020    [provider]  medroxyPROGESTERone (DEPO-PROVERA) 150 MG/ML injection Inject 150 mg into the muscle every 3 (three) months.    [provider]  meloxicam (MOBIC) 15 MG tablet TAKE ONE TABLET DAILY AS NEEDED WITH FOOD Patient not taking: Reported on 12/19/2020 04/19/19   Levert Feinstein, MD  nortriptyline [facility]) 50 MG capsule She is going to discuss further refills with her neurologist at [facility]. Her next appt is in June. Patient not taking: Reported on 12/19/2020 04/09/20   Levert Feinstein, MD  promethazine (PHENERGAN) 25 MG tablet Take 25 mg by mouth every 6 (six) hours as needed for nausea or vomiting.    [provider]  Riboflavin 100 MG TABS Take 1 tablet (100 mg total) by mouth 2 (two) times daily. 06/01/16   Levert Feinstein, MD  topiramate (TOPAMAX) 100 MG tablet TAKE 1 & 1/2 TABLET (150 MG TOTAL) BY MOUTH 2 TIMES DAILY. 03/26/19   Levert Feinstein, MD  venlafaxine XR (EFFEXOR-XR) 150 MG 24 hr capsule Take 150 mg by mouth daily with breakfast.    [provider]    Allergies    Tape  Review of Systems   Review of Systems  Unable to perform ROS: Acuity of condition    Physical Exam Updated Vital Signs Ht 1.651 m (5\' 5" )   Wt 114.8 kg   BMI 42.12 kg/m   Physical Exam Vitals and nursing note reviewed.  Constitutional:      General: She is not in acute distress.    Appearance: She is ill-appearing.  HENT:     Head: Normocephalic.     Right Ear: External ear normal.     Left Ear: External ear normal.     Nose: Nose normal.  Eyes:     Comments: Eyes deviated to right  Pulmonary:     Effort: Pulmonary effort is normal.  Musculoskeletal:     Cervical back:  Normal range of motion.  Neurological:     Comments: Patient with eyes deviated to right Not moving left side and appears weak on right     ED Results / Procedures / Treatments   Labs (all labs ordered are listed, but only abnormal results are displayed) Labs Reviewed  I-STAT CHEM 8, ED - Abnormal; Notable for the following components:      Result Value   Glucose, Bld 103 (*)    Calcium, Ion 1.00 (*)    All other components within normal limits  CBG MONITORING, ED - Abnormal; Notable  for the following components:   Glucose-Capillary 110 (*)    All other components within normal limits  I-STAT BETA HCG BLOOD, ED ([facility], [facility], [facility] ONLY) - Abnormal; Notable for the following components:   I-stat hCG, quantitative 6.3 (*)    All other components within normal limits  RESP PANEL BY RT-PCR (FLU A&B, COVID) ARPGX2  PROTIME-INR  APTT  CBC  DIFFERENTIAL  COMPREHENSIVE METABOLIC PANEL    EKG None  Radiology CT HEAD CODE STROKE WO CONTRAST  Result Date: 04/02/2021 CLINICAL DATA:  Code stroke. EXAM: CT HEAD WITHOUT CONTRAST TECHNIQUE: Contiguous axial images were obtained from the base of the skull through the vertex without intravenous contrast. COMPARISON:  None. FINDINGS: Brain: There is no acute intracranial hemorrhage or mass effect. Suspected acute appearing loss of gray-white differentiation along the right insula and high right frontoparietal cortex. There are chronic infarcts of the left parietotemporal lobes. Ventricles and sulci are normal in size and configuration. No extra-axial collection. Vascular: No definite hyperdense vessel. Skull: Unremarkable Sinuses/Orbits: No acute abnormality. Other: None. ASPECTS [facility] Stroke Program Early CT Score) - Ganglionic level infarction (caudate, lentiform nuclei, internal capsule, insula, M1-M3 cortex): 6 - Supraganglionic infarction (M4-M6 cortex): 2 Total score (0-10 with 10 being normal): 8 IMPRESSION: No acute intracranial hemorrhage. Acute  infarcts in the right MCA territory (ASPECT score 8). Chronic left MCA territory infarct. These results were called by telephone at the time of interpretation on 04/02/2021 at 12:20 pm to provider Dr. Iver Nestle, who verbally acknowledged these results. Electronically Signed   By: Guadlupe Spanish M.D.   On: 04/02/2021 12:24    Procedures Procedures   Medications Ordered in ED Medications  sodium chloride flush (NS) 0.9 % injection 3 mL (has no administration in time range)  iohexol (OMNIPAQUE) 350 MG/ML injection 100 mL (100 mLs Intravenous Contrast Given 04/02/21 1224)    ED Course  I have reviewed the triage vital signs and the nursing notes.  Pertinent labs & imaging results that were available during my care of the patient were reviewed by me and considered in my medical decision making (see chart for details).    MDM Rules/Calculators/A&P                          Patient seen at bridge as code stroke Airway patent Cleared to proceed to CT with stroke team  Final Clinical Impression(s) / ED Diagnoses Final diagnoses:  PNA (pneumonia)  Stroke  Rx / DC Orders ED Discharge Orders    None       Margarita Grizzle, MD 04/06/21 1339

## 2021-04-02 NOTE — Progress Notes (Signed)
Patient ID: Salomon Fick, female   DOB: 07-21-1986, 35 y.o.   MRN: [id number] 28 Y RT H F MRS 1.  LSW 900pm LN. New onset Lt sided weakness and RT gaze preference. NIHSS 23  CT brain  No ICH ASPECTS 8 . CTA occkuded Lt MCA M1 .CTP core of 22ml and Tmax> 6s Mismatch 34 ML  Endovascular treatment D/W mother. Procedure,reasons alternatives reviewed in a 3 way call. Risks of ICH of 10 % ,worsening neuro function. Death and inability to revascularize discussed. Mother expressed understanding  And provided consent for the treatment. S.Gene Glazebrook MD

## 2021-04-02 NOTE — Consult Note (Signed)
Neurology Consultation Reason for Consult: code stroke Requesting Physician: Charlann Boxer, MD  CC: code stroke  History is obtained from:EMS, chart and stepmother  HPI: Krista Maddox is a 35 y.o. female with a PMHx of prior strokes x 4 with sequelae of aphasia and left sided numbness, chronic MHAs, chronic AC with Eliquis, HLD, ? IIH, DDD, ITP during pregnancy, remote tobacco use, and disorder of optic nerve. Patient presented as a code stroke due to right gaze preference, left sided weakness, aphasia and left facial droop. She was brought in by EMS with BP 120/74 and CBG 109 in the field.  EMS stated LKW was 2100 hours on 04/01/21, but this was discussed with her step mother, with which she lives.    Per step mom, today, at 1001, the Ring camera showed patient leaving home and driving to [employer] to get coffee. When she returned, her daughter heard her yell out at 12. Daughter, who has a learning disability, immediately called step mom at work. LKW updated to is 1000 hrs today.   After brief exam and airway clearance on bridge in ED, patient was taken emergently to CT suite. CTH did not reveal a bleed. CTA head and neck showed LVO. Code IR was activated and patient went to IR for angiogram and intervention if warranted.   Last dose of Eliquis 04/01/21 pm.   Step mother is not primary decision maker but the patient and her daughter live with her. Per step mom, the primary decision maker for patient is her father, Jorja Loa. Consent for IR was given by the father and the step mom.   Regarding patient's stroke history, she had prior right cerebellar and right posterior frontal infarctions in 2018 (while smoking and on oral contraceptives), as well as an acute occlusion of her left M2 on 11/09/2020 for which she was treated with thrombectomy at [facility] (mild residual aphasia).  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 She also  continues to go to speech therapy due to sequelae of aphasia with previous stroke.    LKW: 1000 hrs tPA given?: No, patient is on Eliquis IR, yes, for Right MCA LVO.  Premorbid modified rankin scale: 1  ROS: Unable to obtain due to altered mental status.   Past Medical History:  Diagnosis Date  . DDD (degenerative disc disease), lumbar   . Headache   . Other disorders of optic nerve, not elsewhere classified, unspecified eye   . Stroke (HCC)   . TTP (thrombotic thrombocytopenic purpura)   IIH  Family History  Problem Relation Age of Onset  . Diabetes Mother   . Hypertension Mother   . Ovarian cancer Mother        Hysterectomy  . Healthy Father   . Ovarian cancer Maternal Grandmother        Hysterectomy    Social History:  reports that she quit smoking about 3 years ago. She smoked 0.50 packs per day. She has never used smokeless tobacco. She reports that she does not drink alcohol and does not use drugs.   Exam: Current vital signs: BP 111/67   Pulse 94   Resp 15   Ht 5\' 5"  (1.651 m)   Wt 114.8 kg   SpO2 100%   BMI 42.12 kg/m  Vital signs in last 24 hours: Pulse Rate:  [72-94] 94 (04/21 1530) Resp:  [15-22] 15 (04/21 1530) BP: (101-133)/(67-77) 111/67 (04/21 1530) SpO2:  [100 %] 100 % (  04/21 1530) Arterial Line BP: (117-138)/(57-88) 117/57 (04/21 1530) Weight:  [114.8 kg] 114.8 kg (04/21 1200)   Physical Exam  Constitutional: Appears well-developed and well-nourished. Obese.  Psych: Intermittently yells out daughter's name or expletives to noxious stim, otherwise poorly interactive Eyes: No scleral injection HENT: No oropharyngeal obstruction.  MSK: no joint deformities.  Cardiovascular: Normal rate and regular rhythm.  Respiratory: Effort normal, non-labored breathing GI: Soft.  No distension. There is no tenderness.  Skin: Warm dry and intact visible skin  Neuro: Mental Status: Patient is alert but not oriented.  Patient is unable to give a clear and  coherent history. + aphasia and right sided gaze preference. Cranial Nerves: II: Pupils are equal, round, and reactive to light. Right sided gaze preference. Can track examiner  III,IV, VI: EOMI with right gaze preference but is able to bury fully to the left. No ptosis  V: Facial sensation equal to eyelash brush VII: Slight right facial droop.   VIII: hearing is intact to voice X, XI, XII: Does not follow commands.   Motor: Tone is low on LUE and LLE.  Right upper extremity spontaneously antigravity for extended periods of time.  Good spontaneous movement of the right lower extremity as well, at least 3/5. Sensory: Reacts equally to noxious stimulation in all 4 extremities Plantars: Toes are downgoing bilaterally. Cerebellar: FNF and HKS are unable to be performed due to left hemiparesis.   NIHSS total 24  LOC 0 LOC questions  2 LOC commands  2 Gaze  1 Visual  2 Facial palsy  1 LUE  3 LLE  1 RUE 3 RLE  4 Ataxia  0 Sensory  0 Language  2 Dysarthria  1 Extinction  2  I have reviewed labs in epic and the results pertinent to this consultation are: INR 1.2   Creat 0.8  I have reviewed the images obtained:  CTA head and neck and CT perfusion Occlusion of distal right M1 MCA with partial reconstitution and subsequent reocclusion of most M2 branches. Poor collaterals.  Perfusion imaging demonstrates core infarction 99 mL and penumbra of 34 mL. The extent of core infarction is calculated at greater than apparent loss of gray-white differentiation on noncontrast head CT. Therefore, penumbra may be greater.  Assessment: 35 yo female with history of multiple strokes who presented as a code stroke with symptoms suggestive of LVO. tPA was not given due to Eliquis. CTA head and neck revealed an occlusion of distal right M2 MCA with partial reconstitution and subsequent reocclusion of most M2 branches. Code IR was called and patient was taken emergently to IR suite for angiogram  and possible intervention.  Tacky to see perfusion achieved with some right perisylvian sagittal contrast extravasation versus bleeding  Impression:   Plan:  # Stroke with occlusion of distal right M2 MCA s/p thromectomy, TICI 2b  -admit to ICU by neurology -check puncture site for bleeding or hematomas. -BP goal per IR -SCDs -bleeding precautions. -no non compressive sticks or tube insertion x 24 hours.   -Protonix for GI prophylaxis. -IVF 0.9 % NS at 100cc/hr. -CTH 24 hour after IR procedure. -MRI when able.  -stroke education. -NPO -PT/OT/ST. -labs: lipid panel, TSH, HbA1c -recommend increased dose of statin if LDL over 70.  -tight glucose control.  -telemetry to monitor for arrhythmia.  -NIHSS per protocol. -frequent neurology checks.  -bedrest, strict.  -Senna S bid when diet ordered.  -fall precautions.   # possible aspiration pneumonia:  - chest X-ray   #  social situation There was some lack of clarity on who is patient's Education officer, environmental.  Patient's mother is listed as her emergency contact Marena Chancy), however patient lives with her stepmother Ms. Sanda Klein, who reports that the patient's father Basma Buchner is the patient's legal decision maker, though he is currently out of town.  Both parents (father and mother) were consented for intervention given the large core on head CT via phone.  Stroke team to follow.   Brooke Dare MD-PhD [facility] [phone]  CRITICAL CARE Performed by: Gordy Councilman   Total critical care time: 91 minutes  Critical care time was exclusive of separately billable procedures and treating other patients.  Critical care was necessary to treat or prevent imminent or life-threatening deterioration.  Critical care was time spent personally by me on the following activities: development of treatment plan with patient and/or surrogate as well as nursing, discussions with consultants, evaluation of patient's  response to treatment, examination of patient, obtaining history from patient or surrogate, ordering and performing treatments and interventions, ordering and review of laboratory studies, ordering and review of radiographic studies, pulse oximetry and re-evaluation of patient's condition.

## 2021-04-02 NOTE — Progress Notes (Signed)
Pt arrived to IR suite intubated and sedated. Unable to obtain NIH score at this time.

## 2021-04-02 NOTE — H&P (Signed)
Please see consult note from same date.

## 2021-04-02 NOTE — Anesthesia Preprocedure Evaluation (Signed)
Anesthesia Evaluation  Patient identified by MRN, date of birth, ID band Patient confused  Preop documentation limited or incomplete due to emergent nature of procedure.  Airway   TM Distance: <3 FB   Mouth opening: Limited Mouth Opening Comment: Unable to cooperate with exam Dental   Pulmonary former smoker,    breath sounds clear to auscultation       Cardiovascular negative cardio ROS   Rhythm:regular Rate:Normal     Neuro/Psych  Headaches, Code stroke  Neuromuscular disease CVA    GI/Hepatic   Endo/Other    Renal/GU      Musculoskeletal  (+) Arthritis ,   Abdominal   Peds  Hematology   Anesthesia Other Findings   Reproductive/Obstetrics                             Anesthesia Physical Anesthesia Plan  ASA: III and emergent  Anesthesia Plan: General   Post-op Pain Management:    Induction: Intravenous  PONV Risk Score and Plan: 3 and Ondansetron, Dexamethasone and Treatment may vary due to age or medical condition  Airway Management Planned: Oral ETT  Additional Equipment: Arterial line  Intra-op Plan:   Post-operative Plan: Possible Post-op intubation/ventilation  Informed Consent: I have reviewed the patients History and Physical, chart, labs and discussed the procedure including the risks, benefits and alternatives for the proposed anesthesia with the patient or authorized representative who has indicated his/her understanding and acceptance.       Plan Discussed with: CRNA, Anesthesiologist and Surgeon  Anesthesia Plan Comments:         Anesthesia Quick Evaluation

## 2021-04-02 NOTE — Sedation Documentation (Signed)
Pt transported to 4N24 accompanied by RN and CRNA. Report given to Amy RN at bedside. Handoff completed. Right groin remains level 0 and bilateral lower extremity distal pulses present via doppler. No s/s of distress at this time.

## 2021-04-02 NOTE — Transfer of Care (Signed)
Immediate Anesthesia Transfer of Care Note  Patient: Krista Maddox  Procedure(s) Performed: IR WITH ANESTHESIA (N/A )  Patient Location: ICU  Anesthesia Type:General  Level of Consciousness: drowsy  Airway & Oxygen Therapy: Patient Spontanous Breathing and Patient connected to face mask oxygen  Post-op Assessment: Report given to RN and Post -op Vital signs reviewed and stable  Post vital signs: Reviewed and stable  Last Vitals:  Vitals Value Taken Time  BP 133/77 04/02/21 1513  Temp    Pulse 89 04/02/21 1520  Resp 23 04/02/21 1520  SpO2 100 % 04/02/21 1520  Vitals shown include unvalidated device data.  Last Pain: There were no vitals filed for this visit.       Complications: No complications documented.

## 2021-04-03 ENCOUNTER — Inpatient Hospital Stay (HOSPITAL_COMMUNITY): Payer: Medicare Other

## 2021-04-03 ENCOUNTER — Encounter (HOSPITAL_COMMUNITY): Payer: Self-pay | Admitting: Radiology

## 2021-04-03 DIAGNOSIS — Z8673 Personal history of transient ischemic attack (TIA), and cerebral infarction without residual deficits: Secondary | ICD-10-CM | POA: Diagnosis not present

## 2021-04-03 DIAGNOSIS — G932 Benign intracranial hypertension: Secondary | ICD-10-CM

## 2021-04-03 DIAGNOSIS — G43709 Chronic migraine without aura, not intractable, without status migrainosus: Secondary | ICD-10-CM

## 2021-04-03 DIAGNOSIS — I6601 Occlusion and stenosis of right middle cerebral artery: Secondary | ICD-10-CM | POA: Diagnosis not present

## 2021-04-03 DIAGNOSIS — I63511 Cerebral infarction due to unspecified occlusion or stenosis of right middle cerebral artery: Secondary | ICD-10-CM

## 2021-04-03 DIAGNOSIS — F17200 Nicotine dependence, unspecified, uncomplicated: Secondary | ICD-10-CM

## 2021-04-03 LAB — ECHOCARDIOGRAM COMPLETE
AR max vel: 2.22 cm2
AV Area VTI: 2.17 cm2
AV Area mean vel: 2.37 cm2
AV Mean grad: 4 mmHg
AV Peak grad: 8.2 mmHg
Ao pk vel: 1.43 m/s
Area-P 1/2: 3.6 cm2
Calc EF: 64.3 %
Height: 65 in
S' Lateral: 3 cm
Single Plane A2C EF: 64.3 %
Single Plane A4C EF: 64.4 %
Weight: 4049.41 oz

## 2021-04-03 LAB — BASIC METABOLIC PANEL
Anion gap: 7 (ref 5–15)
BUN: 7 mg/dL (ref 6–20)
CO2: 19 mmol/L — ABNORMAL LOW (ref 22–32)
Calcium: 8.1 mg/dL — ABNORMAL LOW (ref 8.9–10.3)
Chloride: 112 mmol/L — ABNORMAL HIGH (ref 98–111)
Creatinine, Ser: 0.74 mg/dL (ref 0.44–1.00)
GFR, Estimated: >60 mL/min (ref >60)
Glucose, Bld: 117 mg/dL — ABNORMAL HIGH (ref 70–99)
Potassium: 3.6 mmol/L (ref 3.5–5.1)
Sodium: 138 mmol/L (ref 135–145)

## 2021-04-03 LAB — CBC WITH DIFFERENTIAL/PLATELET
Abs Immature Granulocytes: 0.12 10*3/uL — ABNORMAL HIGH (ref 0.00–0.07)
Basophils Absolute: 0 10*3/uL (ref 0.0–0.1)
Basophils Relative: 0 %
Eosinophils Absolute: 0 10*3/uL (ref 0.0–0.5)
Eosinophils Relative: 0 %
HCT: 35.9 % — ABNORMAL LOW (ref 36.0–46.0)
Hemoglobin: 11.6 g/dL — ABNORMAL LOW (ref 12.0–15.0)
Immature Granulocytes: 1 %
Lymphocytes Relative: 12 %
Lymphs Abs: 2.1 10*3/uL (ref 0.7–4.0)
MCH: 29 pg (ref 26.0–34.0)
MCHC: 32.3 g/dL (ref 30.0–36.0)
MCV: 89.8 fL (ref 80.0–100.0)
Monocytes Absolute: 0.8 10*3/uL (ref 0.1–1.0)
Monocytes Relative: 4 %
Neutro Abs: 15.1 10*3/uL — ABNORMAL HIGH (ref 1.7–7.7)
Neutrophils Relative %: 83 %
Platelets: 189 10*3/uL (ref 150–400)
RBC: 4 MIL/uL (ref 3.87–5.11)
RDW: 14.2 % (ref 11.5–15.5)
WBC: 18.1 10*3/uL — ABNORMAL HIGH (ref 4.0–10.5)
nRBC: 0 % (ref 0.0–0.2)

## 2021-04-03 LAB — HEMOGLOBIN A1C
Hgb A1c MFr Bld: 5.7 % — ABNORMAL HIGH (ref 4.8–5.6)
Mean Plasma Glucose: 116.89 mg/dL

## 2021-04-03 LAB — LIPID PANEL
Cholesterol: 93 mg/dL (ref 0–200)
HDL: 29 mg/dL — ABNORMAL LOW (ref >40)
LDL Cholesterol: 48 mg/dL (ref 0–99)
Total CHOL/HDL Ratio: 3.2 RATIO
Triglycerides: 79 mg/dL (ref <150)
VLDL: 16 mg/dL (ref 0–40)

## 2021-04-03 LAB — PHOSPHORUS: Phosphorus: 2.7 mg/dL (ref 2.5–4.6)

## 2021-04-03 LAB — MAGNESIUM: Magnesium: 1.8 mg/dL (ref 1.7–2.4)

## 2021-04-03 LAB — ANTITHROMBIN III: AntiThromb III Func: 108 % (ref 75–120)

## 2021-04-03 LAB — GLUCOSE, CAPILLARY: Glucose-Capillary: 139 mg/dL — ABNORMAL HIGH (ref 70–99)

## 2021-04-03 MED ORDER — ATORVASTATIN CALCIUM 80 MG PO TABS: 80.0000 mg | ORAL_TABLET | Freq: Every day | ORAL | Status: DC

## 2021-04-03 MED ORDER — PROSOURCE TF PO LIQD
90.0000 mL | Freq: Two times a day (BID) | ORAL | Status: DC
  Administered 2021-04-03 – 2021-04-10 (×14): 90 mL
  Filled 2021-04-03 (×14): qty 90

## 2021-04-03 MED ORDER — OSMOLITE 1.5 CAL PO LIQD
1000.0000 mL | ORAL | Status: DC
  Administered 2021-04-03 – 2021-04-06 (×4): 1000 mL
  Filled 2021-04-03 (×3): qty 1000

## 2021-04-03 MED ORDER — ATORVASTATIN CALCIUM 80 MG PO TABS
80.0000 mg | ORAL_TABLET | Freq: Every day | ORAL | Status: DC
  Administered 2021-04-03 – 2021-05-15 (×43): 80 mg
  Filled 2021-04-03 (×43): qty 1

## 2021-04-03 MED ORDER — LORAZEPAM 2 MG/ML IJ SOLN
2.0000 mg | Freq: Once | INTRAMUSCULAR | Status: AC | PRN
  Administered 2021-04-03: 2 mg via INTRAVENOUS
  Filled 2021-04-03: qty 1

## 2021-04-03 MED ORDER — SENNOSIDES-DOCUSATE SODIUM 8.6-50 MG PO TABS
1.0000 | ORAL_TABLET | Freq: Two times a day (BID) | ORAL | Status: DC
  Administered 2021-04-03 – 2021-05-15 (×74): 1
  Filled 2021-04-03 (×79): qty 1

## 2021-04-03 MED ORDER — EZETIMIBE 10 MG PO TABS: 10.0000 mg | ORAL_TABLET | Freq: Every day | ORAL | Status: DC

## 2021-04-03 MED ORDER — PANTOPRAZOLE SODIUM 40 MG PO PACK
40.0000 mg | PACK | Freq: Every day | ORAL | Status: DC
  Administered 2021-04-03 – 2021-05-15 (×43): 40 mg
  Filled 2021-04-03 (×43): qty 20

## 2021-04-03 MED ORDER — CHLORHEXIDINE GLUCONATE CLOTH 2 % EX PADS
6.0000 | MEDICATED_PAD | Freq: Every day | CUTANEOUS | Status: DC
  Administered 2021-04-03 – 2021-05-15 (×34): 6 via TOPICAL

## 2021-04-03 MED ORDER — VENLAFAXINE HCL ER 150 MG PO CP24
150.0000 mg | ORAL_CAPSULE | Freq: Every day | ORAL | Status: DC
  Filled 2021-04-03: qty 1

## 2021-04-03 MED ORDER — TOPIRAMATE 25 MG PO TABS
150.0000 mg | ORAL_TABLET | Freq: Two times a day (BID) | ORAL | Status: DC
  Administered 2021-04-03 – 2021-05-15 (×84): 150 mg
  Filled 2021-04-03: qty 2
  Filled 2021-04-03: qty 1
  Filled 2021-04-03 (×7): qty 2
  Filled 2021-04-03: qty 1
  Filled 2021-04-03 (×3): qty 2
  Filled 2021-04-03: qty 1
  Filled 2021-04-03 (×2): qty 2
  Filled 2021-04-03: qty 1
  Filled 2021-04-03 (×8): qty 2
  Filled 2021-04-03: qty 1
  Filled 2021-04-03: qty 2
  Filled 2021-04-03: qty 1
  Filled 2021-04-03 (×3): qty 2
  Filled 2021-04-03 (×2): qty 1
  Filled 2021-04-03 (×3): qty 2
  Filled 2021-04-03 (×2): qty 1
  Filled 2021-04-03: qty 2
  Filled 2021-04-03: qty 1
  Filled 2021-04-03 (×9): qty 2
  Filled 2021-04-03: qty 1
  Filled 2021-04-03 (×5): qty 2
  Filled 2021-04-03 (×3): qty 1
  Filled 2021-04-03 (×7): qty 2
  Filled 2021-04-03: qty 1
  Filled 2021-04-03 (×5): qty 2
  Filled 2021-04-03: qty 1
  Filled 2021-04-03 (×2): qty 2
  Filled 2021-04-03: qty 1
  Filled 2021-04-03 (×5): qty 2
  Filled 2021-04-03: qty 1
  Filled 2021-04-03 (×4): qty 2

## 2021-04-03 MED ORDER — TOPIRAMATE 25 MG PO TABS: 150.0000 mg | ORAL_TABLET | Freq: Two times a day (BID) | ORAL | Status: DC

## 2021-04-03 MED ORDER — EZETIMIBE 10 MG PO TABS
10.0000 mg | ORAL_TABLET | Freq: Every day | ORAL | Status: DC
  Administered 2021-04-03 – 2021-05-15 (×43): 10 mg
  Filled 2021-04-03 (×43): qty 1

## 2021-04-03 NOTE — Progress Notes (Addendum)
STROKE TEAM PROGRESS NOTE   INTERVAL HISTORY Patient responds to noxious stimuli but remains disoriented and only shouts "ow" or profanities. Patient will also grunt. RN reports that she received report overnight that the patient was shouting her daughter's name, but no one has seen that this AM.   Vitals:   04/03/21 1130 04/03/21 1200 04/03/21 1230 04/03/21 1300  BP: 97/73 92/61 106/65 100/70  Pulse: 84 95 90 91  Resp: 13 15 17 15   Temp:      TempSrc:      SpO2: 98% 99% 99% 98%  Weight:      Height:       CBC:  Recent Labs  Lab 04/02/21 1203 04/02/21 1212 04/03/21 0622  WBC 10.1  --  18.1*  NEUTROABS 6.6  --  15.1*  HGB 13.1 13.3 11.6*  HCT 41.8 39.0 35.9*  MCV 92.9  --  89.8  PLT 187  --  189   Basic Metabolic Panel:  Recent Labs  Lab 04/02/21 1203 04/02/21 1212 04/03/21 0622  NA 137 138 138  K 4.0 4.2 3.6  CL 108 108 112*  CO2 21*  --  19*  GLUCOSE 104* 103* 117*  BUN 11 12 7   CREATININE 0.96 0.80 0.74  CALCIUM 8.5*  --  8.1*   Lipid Panel:  Recent Labs  Lab 04/03/21 0622  CHOL 93  TRIG 79  HDL 29*  CHOLHDL 3.2  VLDL 16  LDLCALC 48   HgbA1c:  Recent Labs  Lab 04/03/21 0622  HGBA1C 5.7*    PHYSICAL EXAM Constitutional: Appears well-developed and well-nourished. Obese.  Eyes: No scleral injection HENT: No oropharyngeal obstruction.  MSK: no joint deformities.  Cardiovascular: Normal rate and regular rhythm.  Respiratory: Effort normal, non-labored breathing GI: Soft.  No distension. There is no tenderness.  Skin: Warm dry and intact visible skin  Neuro: Mental Status: Patient is somnolent and reacts to noxious stimuli. Patient is unable to give a clear and coherent history.  right sided gaze preference. Does not respond to commands. Groans and curses intermittently. Cranial Nerves: II: Pupils are equal, round, and reactive to light. Right sided gaze preference. Cannot track examiner  III,IV, VI: EOMI with right gaze preference  VII:  Slight right facial droop.   VIII: hearing is intact to voice X, XI, XII: Does not follow commands.   Motor: Tone is low on LUE and LLE.  LLE moves in the horizontal plane at the hip spontaneously. Right upper extremity spontaneously antigravity for extended periods of time.  Good spontaneous movement of the right lower extremity as well, at least 3/5. Sensory: Reacts equally to noxious stimulation in all 4 extremities Can localize pain in all 4 extremities. Cerebellar: FNF and HKS are unable to be performed due inability to follow commands.    ASSESSMENT/PLAN Krista Maddox is a 35 y.o. female with history of multiple strokes who presented as a code stroke with symptoms suggestive of LVO. tPA was not given due to Eliquis. CTA head and neck revealed an occlusion of distal right M2 MCA with partial reconstitution and subsequent reocclusion of most M2 branches. Code IR was called and patient was taken emergently to IR suite. Patient underwent thrombectomy. Patient remains extubated but is not able to follow commands. Core track placed today. Will get repeat CT this AM and if stable will start ASA. Also recommend that patient OP PCP or OBGYN discontinue patient Depo-Provera injections. Patient has had multiple strokes, smokes, has a hx of  Pseudotumor cerebri, and has hx of migraines. This medication is contraindicated as it can increase risk for stroke especially in this setting. Patient has been receiving this medication since at least 2019, but patient first stroke was reported in 2018. Patient may still be hypercoagulable for reason beyond this medication (ex. Patient tobacco use). Will also investigate non hypercoagulable etiology for stroke. Patient was previously on Eliquis 5mg  BID, can be considered failure with this new stroke.   Recurrent cryptogenic embolic strokes - Acute infarct of the R MCA due to right M1 occlusion s/p IR with TICI2c, cryptogenic etiology   Code Stroke: CT head  No acute intracranial hemorrhage. Acute infarcts in the right MCA territory. ASPECTS 8.    CTA head & neck Occlusion of distal right M1 MCA with partial reconstitution and subsequent reocclusion of most M2 branches. Poor collaterals.  CT Perfusion imaging demonstrates core infarction 99 mL and penumbra of 34 mL. The extent of core infarction is calculated at greater than apparent loss of gray-white differentiation on noncontrast head CT. Therefore, penumbra may be greater.  MRI pending - Patient not cooperative with MRI at this time.  2D Echo pending  Repeat CT Head - large left MCA infarct, no midline shift. Small SAH sylvian fissure  Repeat CT in am  Hypercoagulable panel pending  LDL 48  HgbA1c 5.7  VTE prophylaxis - SCDs  aspirin 325 mg daily and Eliquis (apixaban) daily prior to admission, hold off ASA for now given small amount of SAH on CT. Recommend to consider coumadin with INR 2.5-3.5 once appropriate to re-start anticoagulation  Therapy recommendations:  PT/OT/SLP  Disposition:  Pending  Hyperlipidemia  Home meds:  atrovastatin 80mg  and zetia 10mg    Will restart post CT if stable  LDL 48, goal < 70  Resume after cortrak placement  Continue statin at discharge  Dysphagia  Cortrack placement for TF  SLP following  Patient unable to follow commands to assess swallowing  Birth control  Recommend OP PCP or OBGYN discontinue Depo - provera due to numerous Contraindications including hx of tobacco use, migraines, and multiple strokes.   Tobacco abuse  Current smoker  Smoking cessation counseling will be provided  Contraindicated for birth control Depo  Hx of pseudotumor cerebri  Follows with Dr. at [facility]  On topamax 150mg  bid - will continue after cortrak placed  Other Stroke Risk Factors  Morbid Obesity, Body mass index is 42.12 kg/m., BMI >/= 30 associated with increased stroke risk, recommend weight loss, diet and exercise as appropriate    Hx stroke and TIA  Migraines  Obstructive sleep apnea    Hospital day # 1  , MD PGY-1  ATTENDING NOTE: I reviewed above note and agree with the assessment and plan. Pt was seen and examined.   35 year old female with history of pseudotumor cerebri, chronic migraine, OSA, history of cryptogenic strokes, smoker, hyperlipidemia admitted for right gaze, left-sided weakness, aphasia and left facial droop.  CT head right MCA early ischemic changes, aspect score 8. No tPA given patient on Eliquis at home.  CTA head and neck showed right M1 and M2 occlusion.  CT perfusion showed large core infarct and perfusion deficit.  Status post thrombectomy with TICI2c reperfusion.  EF 60 to 65%.  LDL 48, A1c 5.7.  WBC 18.1.  Patient not tolerating MRI, repeat CT this afternoon showed large right MCA stroke without midline shift, small amount of right sylvian fissure SAH.  Repeat hypercoagulable labs pending.   Patient has  recurrent cryptogenic embolic strokes.  In 08/2017 patient admitted for left sided numbness and headache.  MRI showed right cerebellum, right temporoparietal small infarcts.  EF 55 to 60%, CT head and neck unremarkable.  LDL 89, A1c 4.8.  TCD bubble study showed trivial PFO.  DVT negative.  TEE no PFO.  Hypercoagulable and autoimmune work-up negative.  Discharged with aspirin and Lipitor.  01/2018 episode of left facial numbness and left leg weakness.  CT head and neck unremarkable.  MRI negative.  06/2018 admitted to [facility] for right hand/face numbness.  MRI showed right tiny parietal infarct.  10/2020 admitted to [facility] for aphasia status post tPA.  CTA neck showed left M2 occlusion.  MRI showed left MCA and punctate right frontal infarct.  TCD bubble study potential trivial PFO, TEE tiny PFO.  11/2020 she was referred to loop recorder which was not done as physician thought she should be candidate for anticoagulation.  Started on Eliquis 5 mg twice daily.  Patient has  history of pseudotumor cerebri and chronic migraine follow-up with Dr. Terrace Maddox at [facility], on Topamax 150 mg twice daily. 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  Today on exam, no family at bedside.  Patient lying in bed, lethargic, nonverbal, not following commands.  Right gaze preference, not able to cross midline.  Blinking to visual threat on the right but not on the left.  Left facial droop.  Left upper and lower extremity flaccid.  Right upper extremity spontaneous movement against gravity.  Right lower extremity 2/5 with pain stimulation. Sensation, coordination and gait not tested.  Etiology for patient stroke still cryptogenic, however embolic in almost all of her strokes.  Patient did have Depo for birth control and current smoker, which may be source of the thrombotic event however cannot explain all her strokes so far.  We will repeat hypercoagulable work-up.  On Eliquis PTA, but apparently failed.  Currently holding off aspirin given small amount of SAH at sylvian fissure on the right.  May consider Coumadin with INR 2.5 to 3.5 once appropriate.  Did not have swallow, currently had core track placed, resume home medication including Lipitor, Zetia, Topamax.  Continue tube feeding and gentle IV fluid.  Close watch neurologically, repeat CT in am to monitor cerebral edema/brain herniation.  Consider neurosurgery consultation for hemicraniectomy if needed.  For detailed assessment and plan, please refer to above as I have made changes wherever appropriate.   Marvel Plan, MD PhD Stroke Neurology 04/03/2021 8:42 PM  This patient is critically ill due to large right MCA stroke, recurrent cryptogenic strokes, and at significant risk of neurological worsening, death form cerebral edema, brain herniation. This patient's care requires constant monitoring of vital signs, hemodynamics, respiratory and cardiac monitoring, review of multiple databases, neurological assessment, discussion with  family, other specialists and medical decision making of high complexity. I spent 45 minutes of neurocritical care time in the care of this patient.   To contact Stroke Continuity provider, please refer to [email or web address]. After hours, contact General Neurology

## 2021-04-03 NOTE — Progress Notes (Signed)
Referring Physician(s): CODE STROKE  Supervising Physician: Julieanne Cotton  Patient Status:  [facility] - In-pt  Chief Complaint: distal right M2 MCA occlusion s/p thrombectomy 04/02/21 with Dr. Corliss Skains   Subjective: Patient with eyes intermittently open. No sign of any awareness or cognition; does not follow commands; responds to pain.   Allergies: Tape  Medications: Prior to Admission medications   Medication Sig Start Date End Date Taking? Authorizing Provider  apixaban (ELIQUIS) 5 MG TABS tablet Take 5 mg by mouth 2 (two) times daily.   Yes [provider]  aspirin EC 325 MG tablet Take 1 tablet (325 mg total) by mouth daily. Patient not taking: Reported on 12/19/2020 07/27/18   Ihor Austin, NP  atenolol (TENORMIN) 50 MG tablet Take 50 mg by mouth daily.    [provider]  atorvastatin (LIPITOR) 40 MG tablet Take 1 tablet (40 mg total) by mouth daily. Patient not taking: No sig reported 07/27/18   Ihor Austin, NP  atorvastatin (LIPITOR) 80 MG tablet Take 80 mg by mouth daily. 02/25/21   [provider]  co-enzyme Q-10 50 MG capsule Take 50 mg by mouth 2 (two) times daily. Patient not taking: Reported on 12/19/2020    [provider]  Erenumab-aooe (AIMOVIG) 140 MG/ML SOAJ Inject 140 mg into the skin every 30 (thirty) days. Patient not taking: Reported on 12/19/2020 04/09/20   Levert Feinstein, MD  ezetimibe (ZETIA) 10 MG tablet Take 10 mg by mouth daily.    [provider]  gabapentin (NEURONTIN) 300 MG capsule TAKE 1 CAPSULE BY MOUTH TWICE DAILY, MAY INCREASE TO 2 TWICE DAILY AS NEEDED 10/30/19   Levert Feinstein, MD  Magnesium Oxide 400 (240 Mg) MG TABS Take 400 mg by mouth 2 (two) times daily. 06/01/16   Levert Feinstein, MD  meclizine (ANTIVERT) 12.5 MG tablet Take 12.5 mg by mouth 3 (three) times daily as needed for dizziness. Patient not taking: Reported on 12/19/2020    [provider]  medroxyPROGESTERone (DEPO-PROVERA) 150 MG/ML injection  Inject 150 mg into the muscle every 3 (three) months.    [provider]  meloxicam (MOBIC) 15 MG tablet TAKE ONE TABLET DAILY AS NEEDED WITH FOOD Patient not taking: Reported on 12/19/2020 04/19/19   Levert Feinstein, MD  nortriptyline [facility]) 50 MG capsule She is going to discuss further refills with her neurologist at [facility]. Her next appt is in June. Patient not taking: Reported on 12/19/2020 04/09/20   Levert Feinstein, MD  NURTEC 75 MG TBDP Take 75 mg by mouth at bedtime. 02/06/21   [provider]  promethazine (PHENERGAN) 25 MG tablet Take 25 mg by mouth every 6 (six) hours as needed for nausea or vomiting.    [provider]  Riboflavin 100 MG TABS Take 1 tablet (100 mg total) by mouth 2 (two) times daily. 06/01/16   Levert Feinstein, MD  topiramate (TOPAMAX) 100 MG tablet TAKE 1 & 1/2 TABLET (150 MG TOTAL) BY MOUTH 2 TIMES DAILY. 03/26/19   Levert Feinstein, MD  venlafaxine XR (EFFEXOR-XR) 150 MG 24 hr capsule Take 150 mg by mouth daily with breakfast.    [provider]     Vital Signs: BP 100/70   Pulse 91   Temp 98.8 F (37.1 C) (Axillary)   Resp 15   Ht 5\' 5"  (1.651 m)   Wt 253 lb 1.4 oz (114.8 kg)   SpO2 98%   BMI 42.12 kg/m   Physical Exam Constitutional:  Comments: Eyes intermittently open. Does not respond to commands. Responds to pain.   Eyes:     Comments: Right gaze deviation  Cardiovascular:     Pulses: Normal pulses.     Comments: Right femoral access site is level zero per bedside RN Skin:    General: Skin is warm and dry.  Neurological:     Mental Status: She is disoriented.     Comments: Non-verbal at this time.      Imaging: CT HEAD CODE STROKE WO CONTRAST  Result Date: 04/02/2021 CLINICAL DATA:  Code stroke. EXAM: CT HEAD WITHOUT CONTRAST TECHNIQUE: Contiguous axial images were obtained from the base of the skull through the vertex without intravenous contrast. COMPARISON:  None. FINDINGS: Brain: There is no acute intracranial  hemorrhage or mass effect. Suspected acute appearing loss of gray-white differentiation along the right insula and high right frontoparietal cortex. There are chronic infarcts of the left parietotemporal lobes. Ventricles and sulci are normal in size and configuration. No extra-axial collection. Vascular: No definite hyperdense vessel. Skull: Unremarkable Sinuses/Orbits: No acute abnormality. Other: None. ASPECTS [facility] Stroke Program Early CT Score) - Ganglionic level infarction (caudate, lentiform nuclei, internal capsule, insula, M1-M3 cortex): 6 - Supraganglionic infarction (M4-M6 cortex): 2 Total score (0-10 with 10 being normal): 8 IMPRESSION: No acute intracranial hemorrhage. Acute infarcts in the right MCA territory (ASPECT score 8). Chronic left MCA territory infarct. These results were called by telephone at the time of interpretation on 04/02/2021 at 12:20 pm to provider Dr. Iver Nestle, who verbally acknowledged these results. Electronically Signed   By: Guadlupe Spanish M.D.   On: 04/02/2021 12:24   CT ANGIO HEAD NECK W WO CM W PERF (CODE STROKE)  Result Date: 04/02/2021 CLINICAL DATA:  Left-sided weakness EXAM: CT ANGIOGRAPHY HEAD AND NECK CT PERFUSION BRAIN TECHNIQUE: Multidetector CT imaging of the head and neck was performed using the standard protocol during bolus administration of intravenous contrast. Multiplanar CT image reconstructions and MIPs were obtained to evaluate the vascular anatomy. Carotid stenosis measurements (when applicable) are obtained utilizing NASCET criteria, using the distal internal carotid diameter as the denominator. Multiphase CT imaging of the brain was performed following IV bolus contrast injection. Subsequent parametric perfusion maps were calculated using RAPID software. CONTRAST:  OMNIPAQUE IOHEXOL 350 MG/ML SOLN COMPARISON:  None. FINDINGS: CTA NECK Aortic arch: Great vessel origins are patent. Common origin of innominate and left common carotid arteries.  Right carotid system: Patent.  No stenosis. Left carotid system: Patent.  No stenosis. Vertebral arteries: Patent. Left vertebral artery is dominant. No stenosis. Skeleton: No acute abnormality. Other neck: Unremarkable. Upper chest: No apical lung mass. Review of the MIP images confirms the above findings CTA HEAD Anterior circulation: Intracranial internal carotid arteries are patent. There is occlusion of the distal right M1 MCA. Partial reconstitution of M2 branches with subsequent reocclusion of most. Left middle and both anterior cerebral arteries are patent. Anterior communicating artery is present. Posterior circulation: Intracranial vertebral arteries are patent. Right vertebral artery is diminutive after PICA origin. Basilar artery is patent. Major cerebellar artery origins are patent. Posterior cerebral arteries are patent. Bilateral posterior communicating arteries are present. Venous sinuses: Patent as allowed by contrast bolus timing. Review of the MIP images confirms the above findings CT Brain Perfusion Findings: CBF (<30%) Volume: 29mL Perfusion (Tmax>6.0s) volume: Mismatch Volume: 102mL Infarction Location:Right MCA territory IMPRESSION: Occlusion of distal right M1 MCA with partial reconstitution and subsequent reocclusion of most M2 branches. Poor collaterals. Perfusion imaging demonstrates  core infarction 99 mL and penumbra of 34 mL. The extent of core infarction is calculated at greater than apparent loss of gray-white differentiation on noncontrast head CT. Therefore, penumbra may be greater. Initial CTA results were provided by telephone at the time of noncontrast head CT interpretation to provider Brooke Dare , who verbally acknowledged these results. Electronically Signed   By: Guadlupe Spanish M.D.   On: 04/02/2021 12:39    Labs:  CBC: Recent Labs    04/02/21 1203 04/02/21 1212 04/03/21 0622  WBC 10.1  --  18.1*  HGB 13.1 13.3 11.6*  HCT 41.8 39.0 35.9*  PLT 187  --   189    COAGS: Recent Labs    04/02/21 1203  INR 1.2  APTT 29    BMP: Recent Labs    04/02/21 1203 04/02/21 1212 04/03/21 0622  NA 137 138 138  K 4.0 4.2 3.6  CL 108 108 112*  CO2 21*  --  19*  GLUCOSE 104* 103* 117*  BUN 11 12 7   CALCIUM 8.5*  --  8.1*  CREATININE 0.96 0.80 0.74  GFRNONAA >60  --  >60    LIVER FUNCTION TESTS: Recent Labs    04/02/21 1203  BILITOT 0.7  AST 18  ALT 14  ALKPHOS 88  PROT 7.0  ALBUMIN 3.3*    Assessment and Plan:  Distal right M2 MCA occlusion s/p thrombectomy 04/02/21 with Dr. 04/04/21  CT head pending. Cleviprex has been stopped. Vascular access site is a level zero. Ok to remove dressing. Care per neurology/primary teams. Please call NIR with any questions.   Electronically Signed: Corliss Skains, AGACNP-BC [phone] 04/03/2021, 2:22 PM   I spent a total of 15 Minutes at the the patient's bedside AND on the patient's hospital floor or unit, greater than 50% of which was counseling/coordinating care for  distal right M2 MCA occlusion s/p thrombectomy.

## 2021-04-03 NOTE — Progress Notes (Addendum)
Dr. Otelia Limes paged about patient's restlessness. Verbal orders for IV ativan 2 mg once PRN for agitation or anxiety. Will continue to monitor.

## 2021-04-03 NOTE — Procedures (Signed)
Cortrak  Person Inserting Tube:  Mikala Podoll E, RD Tube Type:  Cortrak - 43 inches Tube Location:  Left nare Initial Placement:  Stomach Secured by: Bridle Technique Used to Measure Tube Placement:  Documented cm marking at nare/ corner of mouth Cortrak Secured At:  65 cm    Cortrak Tube Team Note:  Consult received to place a Cortrak feeding tube.   No x-ray is required. RN may begin using tube.    If the tube becomes dislodged please keep the tube and contact the Cortrak team at [email or web address] ([email or web address]) for replacement.  If after hours and replacement cannot be delayed, place a NG tube and confirm placement with an abdominal x-ray.    Taralyn Ferraiolo, MS, RD, LDN RD pager number and weekend/on-call pager number located in Amion.   

## 2021-04-03 NOTE — Evaluation (Signed)
Occupational Therapy Evaluation Patient Details Name: Krista Maddox MRN: [id number] DOB: 07-04-1986 Today's Date: 04/03/2021    History of Present Illness Krista Maddox is a 35 y.o. female with a PMHx of prior strokes x 4 (right cerebellar and right posterior frontal infarctions in 2018 (while smoking and on oral contraceptives), as well as an acute occlusion of her left M2 on 11/09/2020 for which she was treated with thrombectomy at [facility] with sequelae of aphasia and left sided numbness), chronic MHAs, chronic AC with Eliquis, HLD, ? IIH, DDD, ITP during pregnancy, remote tobacco use, and disorder of optic nerve. Patient presented as a code stroke due to right gaze preference, left sided weakness, aphasia and left facial droop. IO:EVOJJ infarcts in the right MCA  territory; KKX:[id number] of distal right M1 MCA with partial reconstitution and  subsequent reocclusion of most M2 branches. Poor collaterals. Pt s/p revascularization of occluded RT MCA.   Clinical Impression   This 35 yo female admitted with above presents to acute OT at a total A for all basic ADLs and total A +2 for all mobility. She has a right gaze/head turn preference, is not following commands, moves her right side spontaneously, and responds to pain x4 extremities.PTA she was totally independent with basic ADLS, mobility, and driving per speaking with father over phone. She will continue to benefit from acute OT with follow up at SNF.    Follow Up Recommendations  SNF;Supervision/Assistance - 24 hour    Equipment Recommendations  Other (comment) (TBD next venue)    \   Precautions / Restrictions Precautions Precautions: Fall Restrictions Weight Bearing Restrictions: No      Mobility Bed Mobility Overal bed mobility: Needs Assistance Bed Mobility: Rolling Rolling: Total assist;+2 for physical assistance                     ADL either performed or assessed with clinical judgement   ADL                                          General ADL Comments: Total A     Vision   Vision Assessment?: Vision impaired- to be further tested in functional context Additional Comments: Right head turn and eye gaze preference            Pertinent Vitals/Pain Pain Assessment: Faces (does respond to pain x4 extremities and sternum) Faces Pain Scale: No hurt     Hand Dominance  Unknown   Extremity/Trunk Assessment Upper Extremity Assessment Upper Extremity Assessment: RUE deficits/detail;LUE deficits/detail RUE Deficits / Details: spontaneously moves, resists at times with PROM, mild tightness into flexion at elbow, responds to pain LUE Deficits / Details: No spontaneous movement seen, responds to pain, mild tightness at elbow for flexion, hand more with fingers flexed but has full PROM           Communication Communication Communication:  (mild aphasia from chart review)   Cognition Arousal/Alertness: Lethargic Behavior During Therapy: Flat affect Overall Cognitive Status: Impaired/Different from baseline                                 General Comments: Not following any commands, spontaneously moving right side, but not left; respond with ouch and oh as well as grunts to pain  Home Living Family/patient expects to be discharged to:: Skilled nursing facility Living Arrangements: Other relatives (stepmother and dtr (15)) Available Help at Discharge: Family;Available PRN/intermittently Type of Home: House                       Home Equipment: None          Prior Functioning/Environment Level of Independence: Independent        Comments: Including driving, doing her own shopping--per father over phone        OT Problem List: Decreased strength;Decreased range of motion;Decreased activity tolerance;Impaired balance (sitting and/or standing);Impaired vision/perception;Decreased coordination;Decreased  cognition;Decreased safety awareness;Impaired tone;Obesity;Impaired UE functional use      OT Treatment/Interventions: Self-care/ADL training;Therapeutic exercise;Therapeutic activities;Neuromuscular education;Cognitive remediation/compensation;Visual/perceptual remediation/compensation;DME and/or AE instruction;Patient/family education;Balance training    OT Goals(Current goals can be found in the care plan section) Acute Rehab OT Goals Patient Stated Goal: unable OT Goal Formulation: Patient unable to participate in goal setting Time For Goal Achievement: 04/17/21 Potential to Achieve Goals: Fair  OT Frequency: Min 2X/week           Co-evaluation PT/OT/SLP Co-Evaluation/Treatment: Yes Reason for Co-Treatment: Complexity of the patient's impairments (multi-system involvement);For patient/therapist safety          AM-PAC OT "6 Clicks" Daily Activity     Outcome Measure Help from another person eating meals?: Total Help from another person taking care of personal grooming?: Total Help from another person toileting, which includes using toliet, bedpan, or urinal?: Total Help from another person bathing (including washing, rinsing, drying)?: Total Help from another person to put on and taking off regular upper body clothing?: Total Help from another person to put on and taking off regular lower body clothing?: Total 6 Click Score: 6   End of Session Nurse Communication:  (RN in room with [place] whole session)  Activity Tolerance: Patient limited by lethargy Patient left: in bed;with call bell/phone within reach (bed alarm not working (RN aware), safety mat placed on right side of bed due to this is the side the patient moves spontaneously.)  OT Visit Diagnosis: Other abnormalities of gait and mobility (R26.89);Muscle weakness (generalized) (M62.81);Low vision, both eyes (H54.2);Other symptoms and signs involving cognitive function;Cognitive communication deficit (R41.841);Hemiplegia and  hemiparesis Symptoms and signs involving cognitive functions: Cerebral infarction Hemiplegia - Right/Left: Left Hemiplegia - dominant/non-dominant:  (unsure) Hemiplegia - caused by: Cerebral infarction                Time: 1610-9604 OT Time Calculation (min): 22 min Charges:  OT General Charges $OT Visit: 1 Visit OT Evaluation $OT Eval Moderate Complexity: 1 Mod  Ignacia Palma, OTR/L Acute [employer] Pager [phone] Office [phone]     Evette Georges 04/03/2021, 10:25 AM

## 2021-04-03 NOTE — Progress Notes (Signed)
MRI attempted. Pt extremely restless, yelling "amber" and kicking legs. IV Ativan was given but did not help to calm patient and MRI was unsuccessful. Pt sent back to the floor for her safety.

## 2021-04-03 NOTE — Progress Notes (Signed)
  Echocardiogram 2D Echocardiogram has been performed.  Shirlean Kelly 04/03/2021, 8:48 AM

## 2021-04-03 NOTE — Anesthesia Postprocedure Evaluation (Signed)
Anesthesia Post Note  Patient: Krista Maddox  Procedure(s) Performed: IR WITH ANESTHESIA (N/A )     Patient location during evaluation: ICU Anesthesia Type: General Post-procedure mental status: drowsy. Pain management: pain level controlled Vital Signs Assessment: post-procedure vital signs reviewed and stable Respiratory status: spontaneous breathing, nonlabored ventilation, respiratory function stable and patient connected to nasal cannula oxygen Cardiovascular status: blood pressure returned to baseline and stable Postop Assessment: no apparent nausea or vomiting Anesthetic complications: no   No complications documented.  Last Vitals:  Vitals:   04/03/21 0500 04/03/21 0515  BP: 130/60 (!) 132/97  Pulse: 95 95  Resp: 18 18  Temp:    SpO2: 100% 100%    Last Pain:  Vitals:   04/03/21 0400  TempSrc: Axillary                 [name]

## 2021-04-03 NOTE — Progress Notes (Signed)
PT Cancellation Note  Patient Details Name: Krista Maddox MRN: [id number] DOB: [birth year]   Cancelled Treatment:    Reason Eval/Treat Not Completed: Active bedrest order remains this morning. PT will continue to follow and evaluate when appropriate.   Krista Maddox, PT, DPT   Acute Rehabilitation Department Pager #: [phone]   [name] 04/03/2021, 7:48 AM

## 2021-04-03 NOTE — Progress Notes (Signed)
SLP Cancellation Note  Patient Details Name: Krista Maddox MRN: [id number] DOB: 13-Nov-1986   Cancelled treatment:       Reason Eval/Treat Not Completed: Fatigue/lethargy limiting ability to participate (Pt was approached for evaluation, but she was unable to demonstrate an adequate level of alertness to participate. SLP will f/u)  Yvone Neu I. Vear Clock, MS, CCC-SLP Acute Rehabilitation Services Office number [phone] Pager [phone]  Scheryl Marten 04/03/2021, 5:02 PM

## 2021-04-03 NOTE — Progress Notes (Signed)
Initial Nutrition Assessment  DOCUMENTATION CODES:   Obesity unspecified  INTERVENTION:   Initiate tube feeding via Cortrak once in place: Osmolite 1.5 at 50 ml/h (1200 ml per day) Prosource TF 90 ml BID  Provides 1960 kcal, 119 gm protein, 912 ml free water daily    NUTRITION DIAGNOSIS:   Inadequate oral intake related to inability to eat as evidenced by NPO status.  GOAL:   Patient will meet greater than or equal to 90% of their needs  MONITOR:   TF tolerance  REASON FOR ASSESSMENT:   Consult Enteral/tube feeding initiation and management  ASSESSMENT:   Pt with PMH of prior strokes x 4 with mild residual aphasia and L sided numbness, chronic MHAs, HLD, TTP duing pregnancy admitted for R gaze preference, L sided weakness, aphasia and L facial droop with R MCA stroke s.p thrombectomy.   Pt discussed during ICU rounds and with RN.  Pt unable to answer questions. No family present. Per RN pt lives with step-mom and her daughter.   Medications reviewed and include: senokot-s NS @ 75 ml/hr  Labs reviewed: A1C: 5.7    NUTRITION - FOCUSED PHYSICAL EXAM:  Flowsheet Row Most Recent Value  Orbital Region No depletion  Upper Arm Region No depletion  Thoracic and Lumbar Region No depletion  Buccal Region No depletion  Temple Region No depletion  Clavicle Bone Region No depletion  Clavicle and Acromion Bone Region No depletion  Scapular Bone Region No depletion  Dorsal Hand No depletion  Patellar Region No depletion  Anterior Thigh Region No depletion  Posterior Calf Region No depletion  Edema (RD Assessment) None  Hair Reviewed  Eyes Reviewed  Mouth Unable to assess  Skin Reviewed  Nails Reviewed       Diet Order:   Diet Order            Diet NPO time specified  Diet effective now                 EDUCATION NEEDS:   No education needs have been identified at this time  Skin:  Skin Assessment: Reviewed RN Assessment  Last BM:   unknown  Height:   Ht Readings from Last 1 Encounters:  04/02/21 5\' 5"  (1.651 m)    Weight:   Wt Readings from Last 1 Encounters:  04/02/21 114.8 kg    Ideal Body Weight:  56.8 kg  BMI:  Body mass index is 42.12 kg/m.  Estimated Nutritional Needs:   Kcal:  1900-2100  Protein:  110-125 grams  Fluid:  > 2 L/day  04/04/21., RD, LDN, CNSC See AMiON for contact information

## 2021-04-03 NOTE — Evaluation (Signed)
Physical Therapy Evaluation Patient Details Name: Krista Maddox MRN: [id number] DOB: 08-Jul-1986 Today's Date: 04/03/2021   History of Present Illness  The pt is a 35 yo female presenting with L sided facial droop and L sided weakness on 4/21. Underwent angiogram and revascularization on 4/21. 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  Clinical Impression  Pt in bed upon arrival of PT, agreeable to evaluation at this time. Prior to admission the pt was independent with mobility, had returned to driving following past strokes, was living with step mom and daughter. The pt now presents with limitations in functional mobility, strength, command following, coordination, and alertness due to above dx, and will continue to benefit from skilled PT to address these deficits. The pt required totalA to complete all bed mobility, and did not demo any change in alertness or response with use of bed egress to chair position. The pt did not demo any active movement to command at this time, but did demo good spontaneous movement of RLE through session as well as consistent generalized withdraw to noxious stimuli. Pt with R gaze preference, did not demo any visual tracking at time of session. Will continue to benefit from skilled PT to progress functional mobility, activity tolerance, and strength to allow for improved pt participation in mobility.      Follow Up Recommendations SNF;Supervision/Assistance - 24 hour    Equipment Recommendations   (defer to post acute)    Recommendations for Other Services       Precautions / Restrictions Precautions Precautions: Fall Restrictions Weight Bearing Restrictions: No      Mobility  Bed Mobility Overal bed mobility: Needs Assistance Bed Mobility: Rolling Rolling: Total assist;+2 for physical assistance         General bed mobility comments: totalA of 2 to complete pericare, cleaning and changing of  bed pads. pt with no attempts to assist    Transfers                 General transfer comment: deferred   Modified Rankin (Stroke Patients Only) Modified Rankin (Stroke Patients Only) Pre-Morbid Rankin Score: Moderate disability Modified Rankin: Severe disability     Balance Overall balance assessment: Needs assistance   Sitting balance-Leahy Scale: Zero Sitting balance - Comments: dependent on support                                     Pertinent Vitals/Pain Pain Assessment: Faces (does respond to pain x4 extremities and sternum) Faces Pain Scale: No hurt Pain Location: 0 pain at rest, responds with generalized withdrawal to noxious stimuli Pain Intervention(s): Monitored during session    Home Living Family/patient expects to be discharged to:: Skilled nursing facility Living Arrangements: Other relatives (stepmother and daughter (per chart)) Available Help at Discharge: Family;Available PRN/intermittently Type of Home: House         Home Equipment: None      Prior Function Level of Independence: Independent         Comments: Including driving, doing her own shopping--per father over phone     Hand Dominance        Extremity/Trunk Assessment   Upper Extremity Assessment Upper Extremity Assessment: Defer to OT evaluation RUE Deficits / Details: spontaneously moves, resists at times with PROM, mild tightness into flexion at elbow, responds to pain LUE Deficits / Details: No spontaneous movement seen,  responds to pain, mild tightness at elbow for flexion, hand more with fingers flexed but has full PROM    Lower Extremity Assessment Lower Extremity Assessment: Overall WFL for tasks assessed;LLE deficits/detail (spontaneous movment of RLE to include hip flexion and movement at ankle, no movement to command) LLE Deficits / Details: no spontaneous movement, full PROM    Cervical / Trunk Assessment Cervical / Trunk Assessment: Other  exceptions Cervical / Trunk Exceptions: large body habitus, pt maintaining head towardds R through session  Communication   Communication:  (aphasia PTA)  Cognition Arousal/Alertness: Lethargic Behavior During Therapy: Flat affect Overall Cognitive Status: Impaired/Different from baseline                                 General Comments: Not following any commands, spontaneously moving right side, but not left; respond with ouch and oh as well as grunts to pain      General Comments General comments (skin integrity, edema, etc.): VSS on RA    Exercises     Assessment/Plan    PT Assessment Patient needs continued PT services  PT Problem List Decreased strength;Decreased activity tolerance;Decreased range of motion;Decreased balance;Decreased mobility;Decreased coordination;Decreased cognition;Decreased knowledge of use of DME;Decreased safety awareness       PT Treatment Interventions DME instruction;Gait training;Stair training;Therapeutic activities;Functional mobility training;Therapeutic exercise;Balance training;Neuromuscular re-education;Cognitive remediation;Patient/family education    PT Goals (Current goals can be found in the Care Plan section)  Acute Rehab PT Goals Patient Stated Goal: unable PT Goal Formulation: With patient Time For Goal Achievement: 04/17/21 Potential to Achieve Goals: Fair    Frequency Min 3X/week   Barriers to discharge Decreased caregiver support pt requires totalA    Co-evaluation PT/OT/SLP Co-Evaluation/Treatment: Yes Reason for Co-Treatment: Complexity of the patient's impairments (multi-system involvement);For patient/therapist safety;To address functional/ADL transfers PT goals addressed during session: Mobility/safety with mobility;Strengthening/ROM         AM-PAC PT "6 Clicks" Mobility  Outcome Measure Help needed turning from your back to your side while in a flat bed without using bedrails?: Total Help needed  moving from lying on your back to sitting on the side of a flat bed without using bedrails?: Total Help needed moving to and from a bed to a chair (including a wheelchair)?: Total Help needed standing up from a chair using your arms (e.g., wheelchair or bedside chair)?: Total Help needed to walk in hospital room?: Total Help needed climbing 3-5 steps with a railing? : Total 6 Click Score: 6    End of Session   Activity Tolerance: Patient tolerated treatment well Patient left: in bed;with call bell/phone within reach;with bed alarm set;with nursing/sitter in room Nurse Communication: Mobility status PT Visit Diagnosis: Hemiplegia and hemiparesis;Other abnormalities of gait and mobility (R26.89) Hemiplegia - Right/Left: Left Hemiplegia - dominant/non-dominant: Non-dominant Hemiplegia - caused by: Cerebral infarction    Time: 5462-7035 PT Time Calculation (min) (ACUTE ONLY): 23 min   Charges:   PT Evaluation $PT Eval Moderate Complexity: 1 Mod          Rolm Baptise, PT, DPT   Acute Rehabilitation Department Pager #: [phone]  [name] 04/03/2021, 1:10 PM

## 2021-04-04 ENCOUNTER — Inpatient Hospital Stay (HOSPITAL_COMMUNITY): Payer: Medicare Other

## 2021-04-04 DIAGNOSIS — I63511 Cerebral infarction due to unspecified occlusion or stenosis of right middle cerebral artery: Secondary | ICD-10-CM | POA: Diagnosis not present

## 2021-04-04 LAB — PROTEIN S ACTIVITY: Protein S Activity: 77 % (ref 63–140)

## 2021-04-04 LAB — CBC
HCT: 34.9 % — ABNORMAL LOW (ref 36.0–46.0)
Hemoglobin: 11.1 g/dL — ABNORMAL LOW (ref 12.0–15.0)
MCH: 28.9 pg (ref 26.0–34.0)
MCHC: 31.8 g/dL (ref 30.0–36.0)
MCV: 90.9 fL (ref 80.0–100.0)
Platelets: 188 K/uL (ref 150–400)
RBC: 3.84 MIL/uL — ABNORMAL LOW (ref 3.87–5.11)
RDW: 14.7 % (ref 11.5–15.5)
WBC: 13 K/uL — ABNORMAL HIGH (ref 4.0–10.5)
nRBC: 0 % (ref 0.0–0.2)

## 2021-04-04 LAB — BASIC METABOLIC PANEL WITH GFR
Anion gap: 6 (ref 5–15)
BUN: 15 mg/dL (ref 6–20)
CO2: 23 mmol/L (ref 22–32)
Calcium: 8.2 mg/dL — ABNORMAL LOW (ref 8.9–10.3)
Chloride: 109 mmol/L (ref 98–111)
Creatinine, Ser: 0.74 mg/dL (ref 0.44–1.00)
GFR, Estimated: >60 mL/min
Glucose, Bld: 113 mg/dL — ABNORMAL HIGH (ref 70–99)
Potassium: 3.6 mmol/L (ref 3.5–5.1)
Sodium: 138 mmol/L (ref 135–145)

## 2021-04-04 LAB — PHOSPHORUS
Phosphorus: 2.4 mg/dL — ABNORMAL LOW (ref 2.5–4.6)
Phosphorus: 2.3 mg/dL — ABNORMAL LOW (ref 2.5–4.6)

## 2021-04-04 LAB — GLUCOSE, CAPILLARY
Glucose-Capillary: 155 mg/dL — ABNORMAL HIGH (ref 70–99)
Glucose-Capillary: 104 mg/dL — ABNORMAL HIGH (ref 70–99)
Glucose-Capillary: 135 mg/dL — ABNORMAL HIGH (ref 70–99)
Glucose-Capillary: 136 mg/dL — ABNORMAL HIGH (ref 70–99)
Glucose-Capillary: 124 mg/dL — ABNORMAL HIGH (ref 70–99)
Glucose-Capillary: 123 mg/dL — ABNORMAL HIGH (ref 70–99)

## 2021-04-04 LAB — MAGNESIUM
Magnesium: 1.9 mg/dL (ref 1.7–2.4)
Magnesium: 1.8 mg/dL (ref 1.7–2.4)

## 2021-04-04 LAB — PROTEIN C ACTIVITY: Protein C Activity: 136 % (ref 73–180)

## 2021-04-04 LAB — PROTEIN S, TOTAL: Protein S Ag, Total: 89 % (ref 60–150)

## 2021-04-04 LAB — LUPUS ANTICOAGULANT PANEL
DRVVT: 43 s (ref 0.0–47.0)
PTT Lupus Anticoagulant: 26.6 s (ref 0.0–51.9)

## 2021-04-04 MED ORDER — ORAL CARE MOUTH RINSE
15.0000 mL | Freq: Two times a day (BID) | OROMUCOSAL | Status: DC
  Administered 2021-04-04 – 2021-04-05 (×3): 15 mL via OROMUCOSAL

## 2021-04-04 MED ORDER — VENLAFAXINE HCL 37.5 MG PO TABS
75.0000 mg | ORAL_TABLET | Freq: Two times a day (BID) | ORAL | Status: DC
  Administered 2021-04-04 – 2021-05-06 (×66): 75 mg
  Filled 2021-04-04 (×66): qty 2

## 2021-04-04 NOTE — Progress Notes (Signed)
SLP Cancellation Note  Patient Details Name: Krista Maddox MRN: [id number] DOB: 1986-04-13   Cancelled treatment:       Reason Eval/Treat Not Completed: Fatigue/lethargy limiting ability to participate. Will check back 4/24.    Kennie Snedden, Riley Nearing 04/04/2021, 11:00 AM

## 2021-04-04 NOTE — Progress Notes (Signed)
Patient has order for Effexor XR PO at 0800. This medication can not be administered per tube. Contacted pharmacist who is researching an alternative medication due to likelihood that patient will not pass swallow evaluation at this time.

## 2021-04-04 NOTE — Progress Notes (Signed)
STROKE TEAM PROGRESS NOTE   INTERVAL HISTORY Patient very drowsy today and does not respond to any commands. RN reports that patient has been very drowsy today and mostly nonverbal today.   CT Head yesterday noted evolving large acute R MCA infarct with progressive edema but no midline shift and a small SAH in the Sylvian fissure. Repeat CT Head this AM noted the same large R MCA infarct but possible progression cortical infarction in the inferior division and diminished SAH, still no midline shift. Vitals:   04/04/21 0700 04/04/21 0800 04/04/21 0900 04/04/21 1000  BP: 126/67 102/67 106/76 92/67  Pulse: 89 (!) 119 69 78  Resp: 18 (!) 23 16 17   Temp:      TempSrc:      SpO2: 97% 99% 98% 99%  Weight:      Height:       CBC:  Recent Labs  Lab 04/02/21 1203 04/02/21 1212 04/03/21 0622 04/04/21 0519  WBC 10.1  --  18.1* 13.0*  NEUTROABS 6.6  --  15.1*  --   HGB 13.1   < > 11.6* 11.1*  HCT 41.8   < > 35.9* 34.9*  MCV 92.9  --  89.8 90.9  PLT 187  --  189 188   < > = values in this interval not displayed.   Basic Metabolic Panel:  Recent Labs  Lab 04/03/21 0622 04/03/21 1654 04/04/21 0519  NA 138  --  138  K 3.6  --  3.6  CL 112*  --  109  CO2 19*  --  23  GLUCOSE 117*  --  113*  BUN 7  --  15  CREATININE 0.74  --  0.74  CALCIUM 8.1*  --  8.2*  MG  --  1.8 1.9  PHOS  --  2.7 2.4*   Lipid Panel:  Recent Labs  Lab 04/03/21 0622  CHOL 93  TRIG 79  HDL 29*  CHOLHDL 3.2  VLDL 16  LDLCALC 48   HgbA1c:  Recent Labs  Lab 04/03/21 0622  HGBA1C 5.7*   Urine Drug Screen: No results for input(s): LABOPIA, COCAINSCRNUR, LABBENZ, AMPHETMU, THCU, LABBARB in the last 168 hours.  Alcohol Level No results for input(s): ETH in the last 168 hours.  IMAGING past 24 hours CT HEAD WO CONTRAST  Result Date: 04/04/2021 CLINICAL DATA:  Stroke follow-up EXAM: CT HEAD WITHOUT CONTRAST TECHNIQUE: Contiguous axial images were obtained from the base of the skull through the vertex  without intravenous contrast. COMPARISON:  Yesterday FINDINGS: Brain: Large acute right MCA territory infarct with most well-defined cytotoxic edema in the upper division extending into the basal ganglia. Inferior division cortical cytotoxic edema is also noted and could be progressive. Remote left MCA branch infarct affecting the parietal and posterior temporal cortex. Small volume subarachnoid hemorrhage at the right lower sylvian fissure. No hydrocephalus or midline shift. Vascular: Limited at the right MCA due to adjacent hemorrhage. Skull: Negative Sinuses/Orbits: Negative IMPRESSION: 1. Large acute right MCA territory infarct. There may be progressive cortical infarction in the inferior division. 2. Diminished contrast/blood extravasation at the right sylvian fissure. 3. No midline shift. Electronically Signed   By: 04/06/2021 M.D.   On: 04/04/2021 05:18   CT HEAD WO CONTRAST  Result Date: 04/03/2021 CLINICAL DATA:  Stroke follow-up. Status post revascularization of right M1 occlusion yesterday. EXAM: CT HEAD WITHOUT CONTRAST TECHNIQUE: Contiguous axial images were obtained from the base of the skull through the vertex without intravenous contrast. COMPARISON:  04/02/2021 FINDINGS: Brain: There is progressive edema throughout much of the right MCA territory consistent with an evolving acute large infarct with extensive involvement of the frontal and parietal lobes and insula. There is also some involvement of the basal ganglia region and temporal occipital region. There is associated sulcal effacement with mass effect on the basal ganglia and right lateral ventricle without significant midline shift. There is a small amount of hyperdensity in the right sylvian fissure compatible with subarachnoid hemorrhage which is slightly less than on the immediate postprocedural CT obtained in the angiography suite. Some of this density could also be vascular (MCA branch vessels). A chronic moderate-sized left  temporoparietal infarct is again noted. There is no hydrocephalus or extra-axial fluid collection. Vascular: Hyperdensity in the right sylvian fissure as noted above. Skull: No fracture or suspicious osseous lesion. Sinuses/Orbits: Scattered mild mucosal thickening in the paranasal sinuses. Clear mastoid air cells. Rightward gaze. Other: None. IMPRESSION: 1. Evolving large acute right MCA infarct with progressive edema. No midline shift. 2. Small amount of subarachnoid hemorrhage in the right sylvian fissure. 3. Chronic left MCA infarct. Electronically Signed   By: Sebastian Ache M.D.   On: 04/03/2021 18:07    PHYSICAL EXAM Constitutional: Appears well-developed and well-nourished. Obese.  Eyes: No scleral injection HENT: No oropharyngeal obstruction.  MSK: no joint deformities.  Cardiovascular: Normal rate and regular rhythm.  Respiratory: Effort normal, non-labored breathing GI: Soft. No distension. There is no tenderness.  Skin: Warm dry and intact visible skin  Neuro: Mental Status: Patient is somnolent and reacts to noxious stimuli in all extremities. Patient is unable to give a clear and coherent history.  right sided gaze preference. Does not respond to commands. Does not make audible noise today. Cranial Nerves: II: Pupils are equal, round, and reactive to light. Right sided gaze preference.Cannot Gaffer. L eye exotropia noted. III,IV, VI: EOMI with right gaze preference V: No eyelash response on Left.  VII: Slight right facial droop.  VIII: Too encephalopathic today to assess. X, XI, XII: Does not follow commands.   Motor: Tone islow on LUE andLLE. LUE does not move but can localize pain and mild reflex in distal extremity to pain. LLE moves in the horizontal plane at the hip spontaneously. RUE patient refuses to lift today and instead pushes further down into the bed when attempted to assist.  Good spontaneous movement of the right lower extremity, at least  3/5. Sensory: Reacts equally to noxious stimulation in all 4 extremities. Can localize pain in all 4 extremities. Toes upgoing in LLE.  Cerebellar: FNF and HKS are unable to be performed due inability to follow commands.    ASSESSMENT/PLAN [name] is a 35 y.o. female with history of multiple strokes who presented as a code stroke with symptoms suggestive of LVO. tPA was not given due to Eliquis. CTA head and neck revealed an occlusion of distal right M2 MCA with partial reconstitution and subsequent reocclusion of most M2 branches. Code IR was called and patient was taken emergently to IR suite. Patient underwent thrombectomy. Patient remains extubated but is not able to follow commands. Core track placed today. Will get repeat CT this AM and if stable will start ASA. Also recommend that patient OP PCP or OBGYN discontinue patient Depo-Provera injections. Patient has had multiple strokes, smokes, has a hx of Pseudotumor cerebri, and has hx of migraines. This medication is contraindicated as it can increase risk for stroke especially in this setting. Patient has been  receiving this medication since at least 2019, but patient first stroke was reported in 2018. Patient MCA infarct appears to be extending but SAH appears to be more diminshed on repeat CT today. Patient continues to have no midline shift, but patient overall presentation is more encephalopathic than yesterday. Will reassess again with CT in the AM concern for worsening edema is great.   Recurrent cryptogenic embolic strokes - Acute infarct of the R MCA due to right M1 occlusion s/p IR with TICI2c, cryptogenic etiology   Code Stroke: CT head No acute intracranial hemorrhage. Acute infarcts in the right MCA territory. ASPECTS 8.    CTA head & neck Occlusion of distal right M1 MCA with partial reconstitution and subsequent reocclusion of most M2 branches. Poor collaterals.  CT Perfusion imaging demonstrates core  infarction 99 mL and penumbra of 34 mL. The extent of core infarction is calculated at greater than apparent loss of gray-white differentiation on noncontrast head CT. Therefore, penumbra may be greater.  MRI hold - Patient not cooperative with MRI at this time.  2D Echo pending  Repeat CT Head - large left MCA infarct, no midline shift. Small SAH sylvian fissure, diminishing in size  Repeat CT in am, concern for edema  Hypercoagulable panel pending  LDL 48  HgbA1c 5.7  VTE prophylaxis - SCDs  aspirin 325 mg daily and Eliquis (apixaban) daily prior to admission, hold off ASA for now given small amount of SAH on CT and continued ischemia. Recommend to consider coumadin with INR 2.5-3.5 once appropriate to re-start anticoagulation  Therapy recommendations:  PT/OT/SLP  Disposition:  Pending  Hyperlipidemia  Home meds:  atrovastatin 80mg  and zetia 10mg    Will restart post CT if stable  LDL 48, goal < 70  Resume after cortrak placement  Continue statin at discharge  Dysphagia  Cortrack placement for TF  SLP following  Patient unable to follow commands to assess swallowing  Birth control  Recommend OP PCP or OBGYN discontinue Depo - provera due to numerous Contraindications including hx of tobacco use, migraines, and multiple strokes.   Tobacco abuse  Current smoker  Smoking cessation counseling will be provided  Contraindicated for birth control Depo  Hx of pseudotumor cerebri  Follows with Dr. at [facility]  On topamax 150mg  bid - will continue after cortrak placed  Other Stroke Risk Factors  Morbid Obesity, Body mass index is 42.12 kg/m., BMI >/= 30 associated with increased stroke risk, recommend weight loss, diet and exercise as appropriate   Hx stroke and TIA  Migraines  Will start patient home Effexor consult to Pharm for Effexor per tube  Obstructive sleep apnea  Hospital day # 2  Terrace Arabia, MD PGY-1 To contact Stroke Continuity  provider, please refer to [facility]. After hours, contact General Neurology

## 2021-04-05 ENCOUNTER — Inpatient Hospital Stay (HOSPITAL_COMMUNITY): Payer: Medicare Other

## 2021-04-05 DIAGNOSIS — I63511 Cerebral infarction due to unspecified occlusion or stenosis of right middle cerebral artery: Secondary | ICD-10-CM | POA: Diagnosis not present

## 2021-04-05 LAB — GLUCOSE, CAPILLARY
Glucose-Capillary: 101 mg/dL — ABNORMAL HIGH (ref 70–99)
Glucose-Capillary: 102 mg/dL — ABNORMAL HIGH (ref 70–99)
Glucose-Capillary: 110 mg/dL — ABNORMAL HIGH (ref 70–99)
Glucose-Capillary: 117 mg/dL — ABNORMAL HIGH (ref 70–99)
Glucose-Capillary: 129 mg/dL — ABNORMAL HIGH (ref 70–99)
Glucose-Capillary: 93 mg/dL (ref 70–99)

## 2021-04-05 LAB — BASIC METABOLIC PANEL
Anion gap: 7 (ref 5–15)
BUN: 14 mg/dL (ref 6–20)
CO2: 20 mmol/L — ABNORMAL LOW (ref 22–32)
Calcium: 8.4 mg/dL — ABNORMAL LOW (ref 8.9–10.3)
Chloride: 110 mmol/L (ref 98–111)
Creatinine, Ser: 0.64 mg/dL (ref 0.44–1.00)
GFR, Estimated: >60 mL/min (ref >60)
Glucose, Bld: 107 mg/dL — ABNORMAL HIGH (ref 70–99)
Potassium: 4.2 mmol/L (ref 3.5–5.1)
Sodium: 137 mmol/L (ref 135–145)

## 2021-04-05 LAB — PROTEIN C, TOTAL: Protein C, Total: 113 % (ref 60–150)

## 2021-04-05 LAB — CBC
HCT: 39 % (ref 36.0–46.0)
Hemoglobin: 12.3 g/dL (ref 12.0–15.0)
MCH: 28.9 pg (ref 26.0–34.0)
MCHC: 31.5 g/dL (ref 30.0–36.0)
MCV: 91.5 fL (ref 80.0–100.0)
Platelets: 108 10*3/uL — ABNORMAL LOW (ref 150–400)
RBC: 4.26 MIL/uL (ref 3.87–5.11)
RDW: 14.8 % (ref 11.5–15.5)
WBC: 9.9 10*3/uL (ref 4.0–10.5)
nRBC: 0 % (ref 0.0–0.2)

## 2021-04-05 LAB — CARDIOLIPIN ANTIBODIES, IGG, IGM, IGA
Anticardiolipin IgA: <9 APL U/mL (ref 0–11)
Anticardiolipin IgG: <9 GPL U/mL (ref 0–14)
Anticardiolipin IgM: <9 MPL U/mL (ref 0–12)

## 2021-04-05 LAB — PHOSPHORUS: Phosphorus: 2.8 mg/dL (ref 2.5–4.6)

## 2021-04-05 LAB — MAGNESIUM: Magnesium: 1.7 mg/dL (ref 1.7–2.4)

## 2021-04-05 MED ORDER — ASPIRIN 81 MG PO CHEW
162.0000 mg | CHEWABLE_TABLET | Freq: Every day | ORAL | Status: DC
  Filled 2021-04-05: qty 2

## 2021-04-05 MED ORDER — ASPIRIN 81 MG PO CHEW
162.0000 mg | CHEWABLE_TABLET | Freq: Every day | ORAL | Status: DC
  Administered 2021-04-05 – 2021-04-10 (×6): 162 mg
  Filled 2021-04-05 (×5): qty 2

## 2021-04-05 NOTE — Progress Notes (Signed)
STROKE TEAM PROGRESS NOTE   INTERVAL HISTORY Nursing staff reports that she has fluctuating drowsiness but mostly unchanged.  The family is at the bedside.  Father and brothers were there.  Nursing staff reports that the patient was more responsive when her daughter was there.  Repeat head CT scan is unchanged and examination is mostly stable post stroke day 3 indicating edema is unchanged at this time.  No need for hypertonic solution or decompressive surgery at this time.     Vitals:   04/05/21 0500 04/05/21 0600 04/05/21 0700 04/05/21 0800  BP: 115/88 115/72  (!) 112/100  Pulse: 93 99 (!) 107 (!) 104  Resp: 18 17 18 19   Temp:    98.5 F (36.9 C)  TempSrc:    Axillary  SpO2: 100% 99% 100% 99%  Weight:      Height:       CBC:  Recent Labs  Lab 04/02/21 1203 04/02/21 1212 04/03/21 0622 04/04/21 0519 04/05/21 0711  WBC 10.1  --  18.1* 13.0* 9.9  NEUTROABS 6.6  --  15.1*  --   --   HGB 13.1   < > 11.6* 11.1* 12.3  HCT 41.8   < > 35.9* 34.9* 39.0  MCV 92.9  --  89.8 90.9 91.5  PLT 187  --  189 188 108*   < > = values in this interval not displayed.   Basic Metabolic Panel:  Recent Labs  Lab 04/04/21 0519 04/04/21 1629 04/05/21 0711  NA 138  --  137  K 3.6  --  4.2  CL 109  --  110  CO2 23  --  20*  GLUCOSE 113*  --  107*  BUN 15  --  14  CREATININE 0.74  --  0.64  CALCIUM 8.2*  --  8.4*  MG 1.9 1.8 1.7  PHOS 2.4* 2.3* 2.8   Lipid Panel:  Recent Labs  Lab 04/03/21 0622  CHOL 93  TRIG 79  HDL 29*  CHOLHDL 3.2  VLDL 16  LDLCALC 48   HgbA1c:  Recent Labs  Lab 04/03/21 0622  HGBA1C 5.7*   Urine Drug Screen: No results for input(s): LABOPIA, COCAINSCRNUR, LABBENZ, AMPHETMU, THCU, LABBARB in the last 168 hours.  Alcohol Level No results for input(s): ETH in the last 168 hours.  IMAGING past 24 hours CT HEAD WO CONTRAST  Result Date: 04/05/2021 CLINICAL DATA:  Stroke follow-up EXAM: CT HEAD WITHOUT CONTRAST TECHNIQUE: Contiguous axial images were  obtained from the base of the skull through the vertex without intravenous contrast. COMPARISON:  Head CT 04/04/2021 Brain MRI 04/03/2021 FINDINGS: Brain: Unchanged appearance of large right MCA territory infarct with areas of hyper density along the course of the right MCA. Old left parietotemporal infarct is unchanged. Leftward midline shift measures 4 mm. Vascular: No hyperdense vessel or unexpected calcification. Skull: Normal. Negative for fracture or focal lesion. Sinuses/Orbits: Fluid in the right sphenoid sinus. Other: None. IMPRESSION: 1. Unchanged appearance of large right MCA territory infarct with 4 mm leftward midline shift. Electronically Signed   By: 04/05/2021 M.D.   On: 04/05/2021 00:56        PHYSICAL EXAM Constitutional: Appears well-developed and well-nourished. Obese.  Eyes: No scleral injection HENT: No oropharyngeal obstruction.  MSK: no joint deformities.  Cardiovascular: Normal rate and regular rhythm.  Respiratory: Effort normal, non-labored breathing GI: Soft. No distension. There is no tenderness.  Skin: Warm dry and intact visible skin  Neuro: Mental Status: Patient is somnolent  and reacts verbal commands. Patient is unable to give a clear and coherent history.  right sided gaze preference. Does not respond to commands. Does not make audible noise today. Cranial Nerves: II: Pupils are equal, round, and reactive to light. Right sided gaze preference.Cannot Gaffer. L eye exotropia noted. III,IV, VI: Able to cross the midline to the left with cold calorics in the left ear. V: No eyelash response on Left.  VII: Slight right facial droop.  VIII: Too encephalopathic today to assess. X, XI, XII: Does not follow commands.   Motor: Tone islow on LUE andLLE.  Strength left side 0/5.  Right upper extremity 4/5 and right leg at least 3/5. Sensory: Reacts equally to noxious stimulation in all 4 extremities. Can localize pain in all 4  extremities. Toes upgoing in LLE.  Cerebellar: FNF and HKS are unable to be performed due inability to follow commands.    ASSESSMENT/PLAN Ms. MAKHIA VOSLER is a 35 y.o. female with history of multiple strokes who presented as a code stroke with symptoms suggestive of LVO. tPA was not given due to Eliquis. CTA head and neck revealed an occlusion of distal right M2 MCA with partial reconstitution and subsequent reocclusion of most M2 branches. Code IR was called and patient was taken emergently to IR suite. Patient underwent thrombectomy. Patient remains extubated but is not able to follow commands. Core track placed today. Will get repeat CT this AM and if stable will start ASA. Also recommend that patient OP PCP or OBGYN discontinue patient Depo-Provera injections. Patient has had multiple strokes, smokes, has a hx of Pseudotumor cerebri, and has hx of migraines. This medication is contraindicated as it can increase risk for stroke especially in this setting. Patient has been receiving this medication since at least 2019, but patient first stroke was reported in 2018. Patient MCA infarct appears to be extending but SAH appears to be more diminshed on repeat CT today. Patient continues to have no midline shift, but patient overall presentation is more encephalopathic than yesterday. Will reassess again with CT in the AM concern for worsening edema is great.   Recurrent cryptogenic embolic strokes - Acute infarct of the R MCA due to right M1 occlusion s/p IR with TICI2c, cryptogenic etiology   Code Stroke: CT head No acute intracranial hemorrhage. Acute infarcts in the right MCA territory. ASPECTS 8.    CTA head & neck Occlusion of distal right M1 MCA with partial reconstitution and subsequent reocclusion of most M2 branches. Poor collaterals.  CT Perfusion imaging demonstrates core infarction 99 mL and penumbra of 34 mL. The extent of core infarction is calculated at greater than apparent  loss of gray-white differentiation on noncontrast head CT. Therefore, penumbra may be greater.  MRI hold - Patient not cooperative with MRI at this time.  2D Echo pending  Repeat CT Head - large left MCA infarct, no midline shift. Small SAH sylvian fissure, diminishing in size  Repeat CT in am, concern for edema  Hypercoagulable panel pending  LDL 48  HgbA1c 5.7  VTE prophylaxis - SCDs  aspirin 325 mg daily and Eliquis (apixaban) daily prior to admission, hold off ASA for now given small amount of SAH on CT and continued ischemia. Recommend to consider coumadin with INR 2.5-3.5 once appropriate to re-start anticoagulation.  Aspirin restarted as subarachnoid hemorrhage improved/unchanged.  Therapy recommendations:  PT/OT/SLP  Disposition:  Pending  Hyperlipidemia  Home meds:  atrovastatin 80mg  and zetia 10mg    Will restart post  CT if stable  LDL 48, goal < 70  Resume after cortrak placement  Continue statin at discharge  Dysphagia  Cortrack placement for TF  SLP following  Patient unable to follow commands to assess swallowing  Birth control  Recommend OP PCP or OBGYN discontinue Depo - provera due to numerous Contraindications including hx of tobacco use, migraines, and multiple strokes.   Tobacco abuse  Current smoker  Smoking cessation counseling will be provided  Contraindicated for birth control Depo  Hx of pseudotumor cerebri  Follows with Dr. Terrace Arabia at [facility]  On topamax 150mg  bid - will continue after cortrak placed  Other Stroke Risk Factors  Morbid Obesity, Body mass index is 42.12 kg/m., BMI >/= 30 associated with increased stroke risk, recommend weight loss, diet and exercise as appropriate   Hx stroke and TIA  Migraines  Will start patient home Effexor consult to Pharm for Effexor per tube  Obstructive sleep apnea  Hospital day # 3  This patient is critically ill due to stroke post procedure and at significant risk of  neurological worsening, death form severe anemia, bleeding, recurrent stroke, intracranial stenosis. This patient's care requires constant monitoring of vital signs, hemodynamics, respiratory and cardiac monitoring, review of multiple databases, neurological assessment, other specialists and medical decision making of high complexity. I spent 35 minutes of neurocritical care time in the care of this patient     To contact Stroke Continuity provider, please refer to . After hours, contact General Neurology

## 2021-04-06 ENCOUNTER — Ambulatory Visit: Payer: Medicare Other

## 2021-04-06 DIAGNOSIS — I63511 Cerebral infarction due to unspecified occlusion or stenosis of right middle cerebral artery: Secondary | ICD-10-CM | POA: Diagnosis not present

## 2021-04-06 LAB — BASIC METABOLIC PANEL
Anion gap: 6 (ref 5–15)
BUN: 12 mg/dL (ref 6–20)
CO2: 21 mmol/L — ABNORMAL LOW (ref 22–32)
Calcium: 9 mg/dL (ref 8.9–10.3)
Chloride: 110 mmol/L (ref 98–111)
Creatinine, Ser: 0.63 mg/dL (ref 0.44–1.00)
GFR, Estimated: >60 mL/min (ref >60)
Glucose, Bld: 117 mg/dL — ABNORMAL HIGH (ref 70–99)
Potassium: 4.4 mmol/L (ref 3.5–5.1)
Sodium: 137 mmol/L (ref 135–145)

## 2021-04-06 LAB — BETA-2-GLYCOPROTEIN I ABS, IGG/M/A
Beta-2 Glyco I IgG: <9 GPI IgG units (ref 0–20)
Beta-2-Glycoprotein I IgA: 9 GPI IgA units (ref 0–25)
Beta-2-Glycoprotein I IgM: 16 GPI IgM units (ref 0–32)

## 2021-04-06 LAB — HOMOCYSTEINE: Homocysteine: 15.1 umol/L — ABNORMAL HIGH (ref 0.0–14.5)

## 2021-04-06 LAB — CBC
HCT: 35.9 % — ABNORMAL LOW (ref 36.0–46.0)
Hemoglobin: 11.3 g/dL — ABNORMAL LOW (ref 12.0–15.0)
MCH: 28.8 pg (ref 26.0–34.0)
MCHC: 31.5 g/dL (ref 30.0–36.0)
MCV: 91.6 fL (ref 80.0–100.0)
Platelets: 174 10*3/uL (ref 150–400)
RBC: 3.92 MIL/uL (ref 3.87–5.11)
RDW: 14.8 % (ref 11.5–15.5)
WBC: 10.5 10*3/uL (ref 4.0–10.5)
nRBC: 0 % (ref 0.0–0.2)

## 2021-04-06 LAB — GLUCOSE, CAPILLARY
Glucose-Capillary: 108 mg/dL — ABNORMAL HIGH (ref 70–99)
Glucose-Capillary: 107 mg/dL — ABNORMAL HIGH (ref 70–99)
Glucose-Capillary: 113 mg/dL — ABNORMAL HIGH (ref 70–99)
Glucose-Capillary: 104 mg/dL — ABNORMAL HIGH (ref 70–99)
Glucose-Capillary: 92 mg/dL (ref 70–99)
Glucose-Capillary: 104 mg/dL — ABNORMAL HIGH (ref 70–99)

## 2021-04-06 MED ORDER — CHLORHEXIDINE GLUCONATE 0.12 % MT SOLN
15.0000 mL | Freq: Two times a day (BID) | OROMUCOSAL | Status: DC
  Administered 2021-04-06 – 2021-05-15 (×75): 15 mL via OROMUCOSAL
  Filled 2021-04-06 (×75): qty 15

## 2021-04-06 MED ORDER — ORAL CARE MOUTH RINSE
15.0000 mL | Freq: Two times a day (BID) | OROMUCOSAL | Status: DC
  Administered 2021-04-06 – 2021-05-15 (×67): 15 mL via OROMUCOSAL

## 2021-04-06 MED ORDER — LABETALOL HCL 100 MG PO TABS
100.0000 mg | ORAL_TABLET | Freq: Two times a day (BID) | ORAL | Status: DC
  Filled 2021-04-06: qty 1

## 2021-04-06 MED ORDER — LABETALOL HCL 100 MG PO TABS
100.0000 mg | ORAL_TABLET | Freq: Two times a day (BID) | ORAL | Status: DC
  Administered 2021-04-06 – 2021-05-08 (×66): 100 mg
  Filled 2021-04-06 (×65): qty 1

## 2021-04-06 MED ORDER — ENOXAPARIN SODIUM 40 MG/0.4ML ~~LOC~~ SOLN
40.0000 mg | SUBCUTANEOUS | Status: DC
  Administered 2021-04-06 – 2021-04-12 (×7): 40 mg via SUBCUTANEOUS
  Filled 2021-04-06 (×7): qty 0.4

## 2021-04-06 NOTE — Progress Notes (Signed)
SLP Cancellation Note  Patient Details Name: Krista Maddox MRN: [id number] DOB: 19-Jun-1986   Cancelled treatment:         Attempted to arouse with verbal/tactile cues. She opened eyes for 15 seconds but could not sustain. Not ready for po assessment or language eval at this time. Will continue efforts.     [name] 04/06/2021, 10:01 AM

## 2021-04-06 NOTE — Progress Notes (Deleted)
SLP Cancellation Note  Patient Details Name: Krista Maddox MRN: [id number] DOB: 10-03-1986   Cancelled treatment:        Attempted to arouse with verbal/tactile cues. She opened eyes for 15 seconds but could not sustain. Not ready for po assessment or language eval at this time. Will continue efforts.    [name] 04/06/2021, 9:57 AM

## 2021-04-06 NOTE — Progress Notes (Addendum)
STROKE TEAM PROGRESS NOTE   INTERVAL HISTORY Patient remains unable to follow commands and aphasic. Patient does groan and moan with noxious stimuli. Patient is tachycardic. Per RN patient becomes tachy when stimulated and notes that recent blood draw caused patient a lot of discomfort and patient could be heard in the hall groaning in protest to the blood draw despite it being in her LUE which is her weaker UE.   Vitals:   04/06/21 0500 04/06/21 0600 04/06/21 0700 04/06/21 0800  BP:  119/80 131/86 113/75  Pulse: (!) 125 81 93 (!) 107  Resp: (!) 21 17 17 16   Temp:    99.5 F (37.5 C)  TempSrc:    Axillary  SpO2: 98% 98% 99% 99%  Weight:      Height:       CBC:  Recent Labs  Lab 04/02/21 1203 04/02/21 1212 04/03/21 0622 04/04/21 0519 04/05/21 0711  WBC 10.1  --  18.1* 13.0* 9.9  NEUTROABS 6.6  --  15.1*  --   --   HGB 13.1   < > 11.6* 11.1* 12.3  HCT 41.8   < > 35.9* 34.9* 39.0  MCV 92.9  --  89.8 90.9 91.5  PLT 187  --  189 188 108*   < > = values in this interval not displayed.   Basic Metabolic Panel:  Recent Labs  Lab 04/04/21 0519 04/04/21 1629 04/05/21 0711  NA 138  --  137  K 3.6  --  4.2  CL 109  --  110  CO2 23  --  20*  GLUCOSE 113*  --  107*  BUN 15  --  14  CREATININE 0.74  --  0.64  CALCIUM 8.2*  --  8.4*  MG 1.9 1.8 1.7  PHOS 2.4* 2.3* 2.8   Lipid Panel:  Recent Labs  Lab 04/03/21 0622  CHOL 93  TRIG 79  HDL 29*  CHOLHDL 3.2  VLDL 16  LDLCALC 48   HgbA1c:  Recent Labs  Lab 04/03/21 0622  HGBA1C 5.7*   Urine Drug Screen: No results for input(s): LABOPIA, COCAINSCRNUR, LABBENZ, AMPHETMU, THCU, LABBARB in the last 168 hours.  Alcohol Level No results for input(s): ETH in the last 168 hours.  IMAGING past 24 hours No results found.  PHYSICAL EXAM Constitutional: Appears well-developed and well-nourished. Obese.  Eyes: No scleral injection HENT: No oropharyngeal obstruction.  MSK: no joint deformities.  Cardiovascular: Tachycardiac  rate and regular rhythm.  Respiratory: Effort normal, non-labored breathing GI: Soft. No distension. There is no tenderness.  Skin: Warm dry and intact visible skin  Neuro: Mental Status: Patient is somnolent and reacts to noxious stimuli in all extremities. Patient hasright sided gaze preference.  Unable to look to the left.  Blinks to threat on the right but not on the left. Does not respond to commands. Does make audible noise today via groans and moans. Cranial Nerves: II: Pupils are equal, round, and reactive to light. Right sided gaze preference.Cannottrack examiner. L eye exotropia noted. III,IV, VI: EOMI with right gaze preference V: No eyelash response on Left.  VII: Moderate left facial droop.  VIII: Too encephalopathic today to assess. X, XI, XII: Does not follow commands.   Motor: Tone is decreased in LUE but bulk areappropriate in all extremities.LUE recoils to pain today. LLE maintains horizontal plane movement and now able to move at the knee and draw foot up bed, still not lifting off the bed. RUE good spontaneous movement against gravity.Good  spontaneous movement of the right lower extremity, at least 3/5. Sensory: Reacts equally to noxious stimulation in all 4 extremities.Can localize pain in all 4 extremities. Recoils every extremity in response to pain. Cerebellar: FNF and HKS are unable to be performed dueinability to follow commands.    ASSESSMENT/PLAN Krista L Matthewsis a 35 y.o.femalewith history of multiple strokes who presented as a code stroke with symptoms suggestive of LVO. tPA was not given due to Eliquis. CTA head and neck revealed an occlusion of distal right M2 MCA with partial reconstitution and subsequent reocclusion of most M2 branches. Code IR was called and patient was taken emergently to IR suite. Patient underwent thrombectomy. Patient remains extubated but is not able to follow commands. Core track placed today. Will get  repeat CT this AM and if stable will start ASA. Also recommend that patient OP PCP or OBGYN discontinue patient Depo-Provera injections. Patient has had multiple strokes, smokes, has a hx of Pseudotumor cerebri, and has hx of migraines. This medication is contraindicated as it can increase risk for stroke especially in this setting. Patient has been receiving this medication since at least 2019, but patient first stroke was reported in 2018. Patient neurological exam is stable. Patient was able to recoil LUE to pain today which is a positive change in neuro exam; however patient remains aphasic and continues to require noxious stimuli to make alert. Patient was tachy today on exam but remains afebrile will order CXR for tom AM and start on Labetalol.  Recurrent cryptogenic embolic strokes -Acute infarct of the R MCA due to right M1 occlusion s/p IR with TICI2c,cryptogenic etiology  Code Stroke:CT head No acuteintracranial hemorrhage.Acute infarcts in the right MCAterritory.ASPECTS8.   CTA head & neckOcclusion of distal right M1 MCA with partial reconstitution and subsequent reocclusion of most M2 branches. Poor collaterals.  CTPerfusion imaging demonstrates core infarction 99 mL and penumbra of 34 mL. The extent of core infarction is calculated at greater than apparent loss of gray-white differentiation on noncontrast head CT. Therefore, penumbra may be greater.  MRI hold -Patient not cooperative withMRIat this time.  2D EchoLVEF 60-65%, no structural abnormalities noted that could increase risk for CVA  Repeat CT Head-large left MCA infarct, no midline shift. Small SAH sylvian fissure, diminishing in size  Hypercoagulable panelnegative to date , 4/25  LDL48  HgbA1c5.7  VTE prophylaxis -SCDs + Lovenox  aspirin 325 mg daily and Eliquis (apixaban) dailyprior to admission, Recommend to consider coumadin with INR 2.5-3.5 once appropriate to re-start anticoagulation.   Aspirin restarted as subarachnoid hemorrhage improved/unchanged.  Therapy recommendations:PT/OT/SLP  Disposition:Pending  Tachycardia  Concern for infxn with Cortrack at increased risk for Aspiration PNA , CXR in AM  Labtealol 100mg  BID  Hyperlipidemia  Home meds:atrovastatin 80mg  and zetia 10mg    Will restart post CT if stable  LDL 48, goal < 70  Resume after cortrak placement  Continue statin at discharge  Dysphagia  Cortrack placementfor TF  SLP following  Patient unable to follow commands to assess swallowing  Birth control  Recommend OP PCP or OBGYN discontinue Depo - provera due to numerous Contraindications including hx of tobacco use, migraines, and multiple strokes.  Tobacco abuse  Current smoker  Smoking cessation counselingwill beprovided  Contraindicated for birth control Depo  Hx of pseudotumor cerebri  Follows with Dr. at [facility]  On topamax 150mg  bid - will continue after cortrak placed  Other Stroke Risk Factors  MorbidObesity,Body mass index is 42.12 kg/m., BMI >/= 30 associated with  increased stroke risk, recommend weight loss, diet and exercise as appropriate   Hx strokeandTIA  Migraines ? consult to Pharm for Effexor per tube ? Continue home  topamax  Obstructive sleep apnea   Hospital day # 4  Eliseo Gum, MD PGY-1 I have personally obtained history,examined this patient, reviewed notes, independently viewed imaging studies, participated in medical decision making and plan of care.ROS completed by me personally and pertinent positives fully documented  I have made any additions or clarifications directly to the above note. Agree with note above.  Plan add labetalol 100 mg twice daily for her resting tachycardia and mild hypertension.  If she spikes fever may need blood cultures and chest x-ray and UA.  Start Lovenox for DVT prevention.  Mobilize out of bed.  Transfer to neurology floor bed continue tube feeds  and ongoing therapies.  Hopefully transfer to rehab later this week.  Greater than 50% time during this 25-minute visit was spent on counseling and coordination of care about his stroke and dysphagia and discussion with care team and answering questions.  Delia Heady, MD Medical Director [facility] Pager: [phone] 04/06/2021 5:00 PM  To contact Stroke Continuity provider, please refer to [email or web address]. After hours, contact General Neurology

## 2021-04-06 NOTE — Progress Notes (Signed)
Physical Therapy Treatment Patient Details Name: Krista Maddox MRN: [id number] DOB: Oct 30, 1986 Today's Date: 04/06/2021    History of Present Illness The pt is a 35 yo female presenting with L sided facial droop and L sided weakness on 4/21. Underwent angiogram and revascularization on 4/21. 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    PT Comments    The pt was seen for session focused on progressing command following, alertness, and AROM. Pt aunt present through session, remained supportive, and was educated on PROM for L extremities as well as ways to encourage attention to L side extremities. The pt continues to demo spontaneous movement of RUE and RLE, but was unable to consistently repeat movements to command at this time. The pt was also noted to have continued R gaze with R cervical rotation, manual therapy was used to facilitate improved cervical ROM with good increase in tolerated PROM. The pt will continue to benefit from skilled PT acutely, and will likely need prolonged rehab following d/c.     Follow Up Recommendations  SNF;Supervision/Assistance - 24 hour     Equipment Recommendations   (defer to post acute)    Recommendations for Other Services       Precautions / Restrictions Precautions Precautions: Fall Restrictions Weight Bearing Restrictions: No    Mobility  Bed Mobility Overal bed mobility: Needs Assistance Bed Mobility: Rolling Rolling: Total assist;+2 for physical assistance         General bed mobility comments: totalA to complete mobility in bed, rolling, and repositioning. pt with strong lean to R, head turned to R through session    Transfers                 General transfer comment: deferred   Modified Rankin (Stroke Patients Only) Modified Rankin (Stroke Patients Only) Pre-Morbid Rankin Score: Moderate disability Modified Rankin: Severe disability     Balance Overall balance  assessment: Needs assistance   Sitting balance-Leahy Scale: Zero Sitting balance - Comments: dependent on support                                    Cognition Arousal/Alertness: Lethargic Behavior During Therapy: Flat affect Overall Cognitive Status: Impaired/Different from baseline                                 General Comments: pt able to maintain alertness for 5-10 seconds at a time, especially with continued verbal and tactile stimuli. Pt making purposeful movements to scratch her head or remove hair tie with RUE, but did not consistently follow any commands. Pt completed cervical rotation to L x2 through session, but needed manual assist to initiate and complete movement. Pt possibly completed single movement of R toes to command, but was unable to replicate through session, so may have been continued spontaneous movements. No spontaneous movement noted in L extremities.      Exercises General Exercises - Upper Extremity Shoulder Flexion: PROM;Both;10 reps;Supine Elbow Flexion: PROM;Both;10 reps;Seated Elbow Extension: PROM;Both;10 reps;Seated (increased tone with LUE) Wrist Flexion: PROM;Both;10 reps;Supine Wrist Extension: PROM;Both;10 reps;Seated General Exercises - Lower Extremity Ankle Circles/Pumps: PROM;Both;10 reps;Supine Heel Slides: PROM;Both;10 reps;Supine Hip ABduction/ADduction: PROM;Both;10 reps;Supine    General Comments General comments (skin integrity, edema, etc.): VSS on RA, pt aunt present and supportive.      Pertinent  Vitals/Pain Pain Assessment: Faces Faces Pain Scale: Hurts little more Pain Location: 0 pain at rest, pt with grimacing with movement of head, manual therapy to R neck muscles Pain Descriptors / Indicators: Grimacing Pain Intervention(s): Limited activity within patient's tolerance;Monitored during session;Repositioned;Relaxation           PT Goals (current goals can now be found in the care plan  section) Acute Rehab PT Goals Patient Stated Goal: unable PT Goal Formulation: With patient Time For Goal Achievement: 04/17/21 Potential to Achieve Goals: Fair Progress towards PT goals: Progressing toward goals (slowly)    Frequency    Min 3X/week      PT Plan Current plan remains appropriate       AM-PAC PT "6 Clicks" Mobility   Outcome Measure  Help needed turning from your back to your side while in a flat bed without using bedrails?: Total Help needed moving from lying on your back to sitting on the side of a flat bed without using bedrails?: Total Help needed moving to and from a bed to a chair (including a wheelchair)?: Total Help needed standing up from a chair using your arms (e.g., wheelchair or bedside chair)?: Total Help needed to walk in hospital room?: Total Help needed climbing 3-5 steps with a railing? : Total 6 Click Score: 6    End of Session   Activity Tolerance: Patient tolerated treatment well Patient left: in bed;with call bell/phone within reach;with bed alarm set;with nursing/sitter in room Nurse Communication: Mobility status PT Visit Diagnosis: Hemiplegia and hemiparesis;Other abnormalities of gait and mobility (R26.89) Hemiplegia - Right/Left: Left Hemiplegia - dominant/non-dominant: Non-dominant Hemiplegia - caused by: Cerebral infarction     Time: 1232-1308 PT Time Calculation (min) (ACUTE ONLY): 36 min  Charges:  $Therapeutic Exercise: 8-22 mins $Therapeutic Activity: 8-22 mins                     Rolm Baptise, PT, DPT   Acute Rehabilitation Department Pager #: [phone]   [name] 04/06/2021, 4:22 PM

## 2021-04-07 ENCOUNTER — Inpatient Hospital Stay (HOSPITAL_COMMUNITY): Payer: Medicare Other

## 2021-04-07 DIAGNOSIS — I63511 Cerebral infarction due to unspecified occlusion or stenosis of right middle cerebral artery: Secondary | ICD-10-CM | POA: Diagnosis not present

## 2021-04-07 LAB — GLUCOSE, CAPILLARY
Glucose-Capillary: 125 mg/dL — ABNORMAL HIGH (ref 70–99)
Glucose-Capillary: 107 mg/dL — ABNORMAL HIGH (ref 70–99)
Glucose-Capillary: 99 mg/dL (ref 70–99)
Glucose-Capillary: 116 mg/dL — ABNORMAL HIGH (ref 70–99)
Glucose-Capillary: 108 mg/dL — ABNORMAL HIGH (ref 70–99)

## 2021-04-07 LAB — BASIC METABOLIC PANEL
Anion gap: 10 (ref 5–15)
BUN: 14 mg/dL (ref 6–20)
CO2: 19 mmol/L — ABNORMAL LOW (ref 22–32)
Calcium: 9 mg/dL (ref 8.9–10.3)
Chloride: 107 mmol/L (ref 98–111)
Creatinine, Ser: 0.61 mg/dL (ref 0.44–1.00)
GFR, Estimated: >60 mL/min (ref >60)
Glucose, Bld: 116 mg/dL — ABNORMAL HIGH (ref 70–99)
Potassium: 4.2 mmol/L (ref 3.5–5.1)
Sodium: 136 mmol/L (ref 135–145)

## 2021-04-07 LAB — CBC
HCT: 33.9 % — ABNORMAL LOW (ref 36.0–46.0)
Hemoglobin: 10.8 g/dL — ABNORMAL LOW (ref 12.0–15.0)
MCH: 29.4 pg (ref 26.0–34.0)
MCHC: 31.9 g/dL (ref 30.0–36.0)
MCV: 92.4 fL (ref 80.0–100.0)
Platelets: 192 10*3/uL (ref 150–400)
RBC: 3.67 MIL/uL — ABNORMAL LOW (ref 3.87–5.11)
RDW: 15.3 % (ref 11.5–15.5)
WBC: 9.9 10*3/uL (ref 4.0–10.5)
nRBC: 0 % (ref 0.0–0.2)

## 2021-04-07 NOTE — Progress Notes (Signed)
Occupational Therapy Treatment Patient Details Name: Krista Maddox MRN: [id number] DOB: [birth year] Today's Date: 04/07/2021    History of present illness The pt is a 35 yo female presenting with L sided facial droop and L sided weakness on 4/21. Underwent angiogram and revascularization on 4/21. 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   OT comments  Pt supine in bed, seen with PT to maximize participation.  Patient with heavy R gaze preference and head turn, L neglect.  She is able to turn head to midline with max assist but resistive (> resistance with sitting versus supine), eye gaze remains towards R throughout session.  Requires total assist +2 for bed mobility, max-total sitting at EOB statically and resistive to movement of R UE to assist with washing face.  Pt not following commands or engaging with therapist.  Minimal purposeful movement of R UE, L UE remains flaccid.  Will follow acutely. DC plans remains appropriate at SNF.    Follow Up Recommendations  SNF;Supervision/Assistance - 24 hour    Equipment Recommendations  Other (comment) (TBD)    Recommendations for Other Services      Precautions / Restrictions Precautions Precautions: Fall Precaution Comments: R gaze, L hemi Restrictions Weight Bearing Restrictions: No       Mobility Bed Mobility Overal bed mobility: Needs Assistance Bed Mobility: Supine to Sit;Sit to Supine Rolling: Total assist;+2 for physical assistance;+2 for safety/equipment   Supine to sit: +2 for physical assistance;Total assist;+2 for safety/equipment Sit to supine: Total assist;+2 for physical assistance;+2 for safety/equipment   General bed mobility comments: total assist +2 to transition to/from EOB towards L side    Transfers                 General transfer comment: deferred    Balance Overall balance assessment: Needs assistance Sitting-balance support: Feet  supported;Single extremity supported;No upper extremity supported Sitting balance-Leahy Scale: Zero Sitting balance - Comments: max-total assist for static sitting balance, pt with spontaneous movements of R LE and requires mulitmodal cueing/support to keep foot on floor; at times pushing with R UE towards L side Postural control: Posterior lean;Left lateral lean                                 ADL either performed or assessed with clinical judgement   ADL Overall ADL's : Needs assistance/impaired     Grooming: Wash/dry face;Total assistance Grooming Details (indicate cue type and reason): R UE HOH support to bring towards face but resistant to movement; overall total assist                               General ADL Comments: Total A     Vision   Vision Assessment?: Vision impaired- to be further tested in functional context Additional Comments: R head turn and gaze preference, with assist manage head towards midline but eyes remain focused on R gaze   Perception     Praxis      Cognition Arousal/Alertness: Awake/alert Behavior During Therapy: Flat affect Overall Cognitive Status: Difficult to assess                                 General Comments: pt non verbal cueing session, moving R UE purposefully minimally to  scratch her nose, but otherwise non purposeful movements.  Not following commands and not engaging with therapists.        Exercises     Shoulder Instructions       General Comments VSS on RA    Pertinent Vitals/ Pain       Pain Assessment: Faces Pain Score: 0-No pain Faces Pain Scale: Hurts a little bit Pain Location: 0 pain at rest, grimacing with movement of head Pain Descriptors / Indicators: Grimacing Pain Intervention(s): Limited activity within patient's tolerance;Monitored during session;Repositioned  Home Living                                          Prior Functioning/Environment               Frequency  Min 2X/week        Progress Toward Goals  OT Goals(current goals can now be found in the care plan section)  Progress towards OT goals: Progressing toward goals  Acute Rehab OT Goals Patient Stated Goal: unable OT Goal Formulation: Patient unable to participate in goal setting  Plan Discharge plan remains appropriate;Frequency remains appropriate    Co-evaluation    PT/OT/SLP Co-Evaluation/Treatment: Yes Reason for Co-Treatment: Complexity of the patient's impairments (multi-system involvement);For patient/therapist safety;To address functional/ADL transfers;Necessary to address cognition/behavior during functional activity   OT goals addressed during session: ADL's and self-care      AM-PAC OT "6 Clicks" Daily Activity     Outcome Measure   Help from another person eating meals?: Total Help from another person taking care of personal grooming?: Total Help from another person toileting, which includes using toliet, bedpan, or urinal?: Total Help from another person bathing (including washing, rinsing, drying)?: Total Help from another person to put on and taking off regular upper body clothing?: Total Help from another person to put on and taking off regular lower body clothing?: Total 6 Click Score: 6    End of Session    OT Visit Diagnosis: Other abnormalities of gait and mobility (R26.89);Muscle weakness (generalized) (M62.81);Low vision, both eyes (H54.2);Other symptoms and signs involving cognitive function;Cognitive communication deficit (R41.841);Hemiplegia and hemiparesis Symptoms and signs involving cognitive functions: Cerebral infarction Hemiplegia - Right/Left: Left Hemiplegia - caused by: Cerebral infarction   Activity Tolerance Patient tolerated treatment well   Patient Left in bed;with call bell/phone within reach;with bed alarm set;with nursing/sitter in room   Nurse Communication Mobility status        Time:  1607-3710 OT Time Calculation (min): 25 min  Charges: OT General Charges $OT Visit: 1 Visit OT Treatments $Self Care/Home Management : 8-22 mins  Barry Brunner, OT Acute Rehabilitation Services Pager [phone] Office [phone]    Chancy Milroy 04/07/2021, 11:19 AM

## 2021-04-07 NOTE — Care Management Important Message (Signed)
Important Message  Patient Details  Name: MACKENZIE GROOM MRN: [id number] Date of Birth: 01/05/1986   Medicare Important Message Given:  Yes     Dorena Bodo 04/07/2021, 3:13 PM

## 2021-04-07 NOTE — Progress Notes (Signed)
Orthopedic Tech Progress Note Patient Details:  Krista Maddox [birth year] [id number] Called in order to [facility] for a RESTING FOOT SPLINT(PRAFO)  Patient ID: Salomon Fick, female   DOB: 1986/02/01, 35 y.o.   MRN: [id number]   [name] 04/07/2021, 1:24 PM

## 2021-04-07 NOTE — Progress Notes (Signed)
Physical Therapy Treatment Patient Details Name: Krista Maddox MRN: [id number] DOB: March 13, 1986 Today's Date: 04/07/2021    History of Present Illness The pt is a 35 yo female presenting with L sided facial droop and L sided weakness on 4/21. Underwent angiogram and revascularization on 4/21. 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    PT Comments    Pt continues to have strong R gaze preference and very avoidant of facilitated L head turn. Shows discomfort with passive motion as well as STM to L neck musculature. Worked on attending to input on L side with pt in sitting EOB without success. Pt taps R foot on floor repeatedly in sitting. Shows some self driven movement of the RUE including scratching face but often resists facilitated mvmt of that UE. LUE and LE remain flaccid. Recommend PRAFO for LLE. PT will continue to follow.      Follow Up Recommendations  SNF;Supervision/Assistance - 24 hour     Equipment Recommendations   (defer to post acute)    Recommendations for Other Services       Precautions / Restrictions Precautions Precautions: Fall Precaution Comments: R gaze, L hemi Restrictions Weight Bearing Restrictions: No    Mobility  Bed Mobility Overal bed mobility: Needs Assistance Bed Mobility: Supine to Sit;Sit to Supine Rolling: Total assist;+2 for physical assistance;+2 for safety/equipment   Supine to sit: +2 for physical assistance;Total assist;+2 for safety/equipment Sit to supine: Total assist;+2 for physical assistance;+2 for safety/equipment   General bed mobility comments: total assist +2 to transition to/from EOB towards L side. Max A to roll to R, easier only because of pt's R gaze    Transfers                 General transfer comment: unable  Ambulation/Gait             General Gait Details: unable   Stairs             Wheelchair Mobility    Modified Rankin (Stroke  Patients Only) Modified Rankin (Stroke Patients Only) Pre-Morbid Rankin Score: Moderate disability Modified Rankin: Severe disability     Balance Overall balance assessment: Needs assistance Sitting-balance support: Feet supported;Single extremity supported;No upper extremity supported Sitting balance-Leahy Scale: Zero Sitting balance - Comments: max-total assist for static sitting balance, pt with spontaneous movements of R LE and requires mulitmodal cueing/support to keep foot on floor; at times pushing with R UE towards L side. Continuously tapping R foot on floor. Postural control: Posterior lean;Left lateral lean                                  Cognition Arousal/Alertness: Awake/alert Behavior During Therapy: Flat affect Overall Cognitive Status: Difficult to assess                                 General Comments: pt non verbal this session, makes occasional sounds or crying, moving R UE purposefully minimally to scratch her nose, but otherwise non purposeful movements.  Not following commands and not engaging with therapists.      Exercises General Exercises - Lower Extremity Long Arc Quad: PROM;AAROM;Both;10 reps;Seated    General Comments General comments (skin integrity, edema, etc.): VSS on RA      Pertinent Vitals/Pain Pain Assessment: Faces Pain Score: 0-No pain  Faces Pain Scale: Hurts little more Pain Location: moaning and grimacing with turning of head and STM to L neck Pain Descriptors / Indicators: Grimacing;Moaning Pain Intervention(s): Limited activity within patient's tolerance;Monitored during session    Home Living                      Prior Function            PT Goals (current goals can now be found in the care plan section) Acute Rehab PT Goals Patient Stated Goal: unable PT Goal Formulation: With patient Time For Goal Achievement: 04/17/21 Potential to Achieve Goals: Fair    Frequency    Min  3X/week      PT Plan Current plan remains appropriate    Co-evaluation PT/OT/SLP Co-Evaluation/Treatment: Yes Reason for Co-Treatment: Complexity of the patient's impairments (multi-system involvement);Necessary to address cognition/behavior during functional activity;For patient/therapist safety PT goals addressed during session: Mobility/safety with mobility;Balance OT goals addressed during session: ADL's and self-care      AM-PAC PT "6 Clicks" Mobility   Outcome Measure  Help needed turning from your back to your side while in a flat bed without using bedrails?: Total Help needed moving from lying on your back to sitting on the side of a flat bed without using bedrails?: Total Help needed moving to and from a bed to a chair (including a wheelchair)?: Total Help needed standing up from a chair using your arms (e.g., wheelchair or bedside chair)?: Total Help needed to walk in hospital room?: Total Help needed climbing 3-5 steps with a railing? : Total 6 Click Score: 6    End of Session   Activity Tolerance: Patient tolerated treatment well Patient left: in bed;with call bell/phone within reach;with bed alarm set;with nursing/sitter in room Nurse Communication: Mobility status;Other (comment) (PRAFO for L foot) PT Visit Diagnosis: Hemiplegia and hemiparesis;Other abnormalities of gait and mobility (R26.89) Hemiplegia - Right/Left: Left Hemiplegia - dominant/non-dominant: Non-dominant Hemiplegia - caused by: Cerebral infarction     Time: 5188-4166 PT Time Calculation (min) (ACUTE ONLY): 24 min  Charges:  $Therapeutic Activity: 8-22 mins                     Lyanne Co, PT  Acute Rehab Services  Pager [phone] Office [phone]    Lawana Chambers Brande Uncapher 04/07/2021, 11:31 AM

## 2021-04-07 NOTE — Progress Notes (Addendum)
STROKE TEAM PROGRESS NOTE   INTERVAL HISTORY Patient moved to the floor and out ICU. Per RN student patient was verbal when attempting to move and clean patient and although he report dysarthric they could all understand the patient say "stop" and yell a few other one word things. RN student also reports that patient was spontaneously moving her R extremities around the time they were attempting to move her.  Patient remains unable to give hx or interact extensively in verbal part of exam for writer. Vital signs are stable.  Neurological exam is unchanged. Vitals:   04/06/21 2331 04/07/21 0344 04/07/21 0652 04/07/21 0819  BP: 118/78 130/84  123/80  Pulse: 91 86  (!) 107  Resp: 18 19  18   Temp:  98.4 F (36.9 C)  98.5 F (36.9 C)  TempSrc:  Oral  Axillary  SpO2: 100% 100%  99%  Weight:   112.9 kg   Height:       CBC:  Recent Labs  Lab 04/02/21 1203 04/02/21 1212 04/03/21 0622 04/04/21 0519 04/06/21 0846 04/07/21 0344  WBC 10.1  --  18.1*   < > 10.5 9.9  NEUTROABS 6.6  --  15.1*  --   --   --   HGB 13.1   < > 11.6*   < > 11.3* 10.8*  HCT 41.8   < > 35.9*   < > 35.9* 33.9*  MCV 92.9  --  89.8   < > 91.6 92.4  PLT 187  --  189   < > 174 192   < > = values in this interval not displayed.   Basic Metabolic Panel:  Recent Labs  Lab 04/04/21 1629 04/05/21 0711 04/06/21 0846 04/07/21 0344  NA  --  137 137 136  K  --  4.2 4.4 4.2  CL  --  110 110 107  CO2  --  20* 21* 19*  GLUCOSE  --  107* 117* 116*  BUN  --  14 12 14   CREATININE  --  0.64 0.63 0.61  CALCIUM  --  8.4* 9.0 9.0  MG 1.8 1.7  --   --   PHOS 2.3* 2.8  --   --    Lipid Panel:  Recent Labs  Lab 04/03/21 0622  CHOL 93  TRIG 79  HDL 29*  CHOLHDL 3.2  VLDL 16  LDLCALC 48   HgbA1c:  Recent Labs  Lab 04/03/21 0622  HGBA1C 5.7*   Urine Drug Screen: No results for input(s): LABOPIA, COCAINSCRNUR, LABBENZ, AMPHETMU, THCU, LABBARB in the last 168 hours.  Alcohol Level No results for input(s): ETH in the  last 168 hours.  IMAGING past 24 hours DG CHEST PORT 1 VIEW  Result Date: 04/07/2021 CLINICAL DATA:  Pneumonia. EXAM: PORTABLE CHEST 1 VIEW COMPARISON:  02/28/2015. FINDINGS: Feeding tube noted with tip below left hemidiaphragm. Heart size normal. No focal infiltrate. Mild left perihilar and left base subsegmental atelectasis. No pleural effusion or pneumothorax. IMPRESSION: 1.  Feeding tube noted with tip below left hemidiaphragm. 2.  Mild left perihilar and left base subsegmental atelectasis. Electronically Signed   By: 04/09/2021  Register   On: 04/07/2021 06:15    PHYSICAL EXAM Constitutional:  Obese.  Young Caucasian lady not in distress. Eyes: No scleral injection HENT: No oropharyngeal obstruction.  MSK: no joint deformities.  Cardiovascular: Tachycardiac rate and regular rhythm.  Respiratory: Effort normal, non-labored breathing Skin: Warm dry and intact visible skin  Neuro: Mental Status: Patient is somnolent and  reacts to noxious stimuliin all extremities. Patient hasright sided gaze preference.  Unable to look to the left.  Blinks to threat on the right but not on the left. Does not respond to commands.Does say "stop" at least once to one noxious stimulus. Appears dysarthric when she says this. Cranial Nerves: II: Pupils are equal, round, and reactive to light. Right sided gaze preference.Cannottrack examiner. L eye exotropia noted. III,IV, VI: EOMI with right gaze preference V: No eyelash response on Left. VII: Moderate left facial droop.  VIII:Does not follow commands, so unable to assess this. X, XI, XII: Does not follow commands.   Motor: Tone is decreased in LUE but bulk areappropriate in all extremities.Localizes pain on LUE, but but it remains externally rotated and flaccid. LLE maintains horizontal plane movement at the hip. RUE good spontaneous movement against gravity.Good spontaneous movement of the right lower extremity, at least  3/5. Sensory: Reacts equally to noxious stimulation in all 4 extremities.Can localize pain in all 4 extremities. Recoils every extremity in response to pain. Cerebellar: FNF and HKS are unable to be performed dueinability to follow commands.   ASSESSMENT/PLAN Krista L Matthewsis a 35 y.o.femalewith history of multiple strokes who presented as a code stroke with symptoms suggestive of LVO. tPA was not given due to Eliquis. CTA head and neck revealed an occlusion of distal right M2 MCA with partial reconstitution and subsequent reocclusion of most M2 branches. Code IR was called and patient was taken emergently to IR suite. Patient underwent thrombectomy. Patient remains extubated but is not able to follow commands. Core track placed today. Will get repeat CT this AM and if stable will start ASA. Also recommend that patient OP PCP or OBGYN discontinue patient Depo-Provera injections. Patient has had multiple strokes, smokes, has a hx of Pseudotumor cerebri, and has hx of migraines. This medication is contraindicated as it can increase risk for stroke especially in this setting. Patient has been receiving this medication since at least 2019, but patient first stroke was reported in 2018. Patient's physical exam appears to be hindered by behavioral hesitancy to participate in exam. Patient has been noted to to the opposite of what has been asked in prior exams and often appears to just outright refuse to follow commands, instead choosing to close her eyes. On exam today, patient did not open her eyes when asked but opened her eyes when the blanket was removed. Patient also remained silent except for when she was able to say "stop" 2/2 noxious stimuli. Patient's exam is somewhat consistent day to day but her inability to follow commands makes it difficult to assess strength and mobility. Patient has been seen moving limbs spontaneously but will not do so on command. Patient is not aphasic and her  stroke would likely not cause such, but she still does not talk when asked and does not really attempt to follow along and would rather close her eyes.  Recurrent cryptogenic embolic strokes -Acute infarct of the R MCA due to right M1 occlusion s/p IR with TICI2c,cryptogenic etiology  Code Stroke:CT head No acuteintracranial hemorrhage.Acute infarcts in the right MCAterritory.ASPECTS8.   CTA head & neckOcclusion of distal right M1 MCA with partial reconstitution and subsequent reocclusion of most M2 branches. Poor collaterals.  CTPerfusion imaging demonstrates core infarction 99 mL and penumbra of 34 mL. The extent of core infarction is calculated at greater than apparent loss of gray-white differentiation on noncontrast head CT. Therefore, penumbra may be greater.  Concern for behavior hindering neurological exam  advancement  2D EchoLVEF 60-65%, no structural abnormalities noted that could increase risk for CVA  Repeat CT Head-large left MCA infarct, no midline shift. Small SAH sylvian fissure, diminishing in size ,4/24  Hypercoagulable panel: Homocysteine serum slightly elevated at 15.1, otherwise negative to date  LDL48  HgbA1c5.7  VTE prophylaxis -SCDs + Lovenox  aspirin 325 mg daily and Eliquis (apixaban) dailyprior to admission, Recommend to consider coumadin with INR 2.5-3.5 on day 7 of hospitalization.Aspirin restarted as subarachnoid hemorrhage improved/unchanged.  Therapy recommendations:PT/OT/SLP  Disposition:Pending  Tachycardia- improving  CXR: Mild left perihilar and left base subsegmental atelectasis.  Labtealol 100mg  BID  Hyperlipidemia  Home meds:atrovastatin 80mg  and zetia 10mg    Will restart post CT if stable  LDL 48, goal < 70  Resume after cortrak placement  Continue statin at discharge  Dysphagia  Cortrack placementfor TF  SLP following  Patient unable to follow commands to assess swallowing  Birth  control  Recommend OP PCP or OBGYN discontinue Depo - provera due to numerous Contraindications including hx of tobacco use, migraines, and multiple strokes.  Tobacco abuse  Current smoker  Smoking cessation counselingwill beprovided  Contraindicated for birth control Depo  Hx of pseudotumor cerebri  Follows with Dr. at [facility]  On topamax 150mg  bid - will continue after cortrak placed  Other Stroke Risk Factors  MorbidObesity,Body mass index is 42.12 kg/m., BMI >/= 30 associated with increased stroke risk, recommend weight loss, diet and exercise as appropriate   Hx strokeandTIA  Migraines ? consult to Pharm for Effexor per tube ? Continue home  topamax  Obstructive sleep apnea   Hospital day # 5  , MD PGY-1 I have personally obtained history,examined this patient, reviewed notes, independently viewed imaging studies, participated in medical decision making and plan of care.ROS completed by me personally and pertinent positives fully documented  I have made any additions or clarifications directly to the above note. Agree with note above.  Recommend continue aspirin for now and switch to warfarin 1 week after his stroke presentation and she has failed Eliquis and other agents in the past.  Continue ongoing therapy and transfer to rehab when bed available.  Encourage oral intake as she has past speech eval and if she can eat adequately will discontinue feeding tube.  Greater than 50% time during this 25-minute visit was spent on counseling care about her cryptogenic stroke and dysphagia and answering questions and discussion with care team. Terrace Arabia, MD Medical Director [facility] Pager: [phone] 04/07/2021 2:18 PM  To contact Stroke Continuity provider, please refer to Delia Heady. After hours, contact General Neurology

## 2021-04-07 NOTE — Evaluation (Signed)
Clinical/Bedside Swallow Evaluation Patient Details  Name: Krista Maddox MRN: [id number] Date of Birth: [birth year]  Today's Date: 04/07/2021 Time: SLP Start Time (ACUTE ONLY): 0940 SLP Stop Time (ACUTE ONLY): 1000 SLP Time Calculation (min) (ACUTE ONLY): 20 min  Past Medical History:  Past Medical History:  Diagnosis Date  . DDD (degenerative disc disease), lumbar   . Headache   . Other disorders of optic nerve, not elsewhere classified, unspecified eye   . Stroke (HCC)   . TTP (thrombotic thrombocytopenic purpura)    Past Surgical History:  Past Surgical History:  Procedure Laterality Date  . CESAREAN SECTION    . CHOLECYSTECTOMY    . IR CT HEAD LTD  04/02/2021  . IR PERCUTANEOUS ART THROMBECTOMY/INFUSION INTRACRANIAL INC DIAG ANGIO  04/02/2021  . RADIOLOGY WITH ANESTHESIA N/A 04/02/2021   Procedure: IR WITH ANESTHESIA;  Surgeon: Radiologist, Medication, MD;  Location: [facility] OR;  Service: Radiology;  Laterality: N/A;  . TEE WITHOUT CARDIOVERSION N/A 08/22/2017   Procedure: TRANSESOPHAGEAL ECHOCARDIOGRAM (TEE);  Surgeon: Chilton Si, MD;  Location: [facility] ENDOSCOPY;  Service: Cardiovascular;  Laterality: N/A;  . WISDOM TOOTH EXTRACTION     HPI:  Pt is a 35 y.o. female with a PMHx of prior strokes x 4 (right cerebellar and right posterior frontal infarctions in 2018 (while smoking and on oral contraceptives), as well as an acute occlusion of her left M2 on 11/09/2020 for which she was treated with thrombectomy at [facility] with sequelae of aphasia and left sided numbness), chronic MHAs, chronic AC with Eliquis, HLD, ? IIH, DDD, ITP during pregnancy, remote tobacco use, and disorder of optic nerve. Patient presented as a code stroke due to right gaze preference, left sided weakness, aphasia and left facial droop. JO:ACZYS infarcts in the right MCA  territory; AYT:[id number] of distal right M1 MCA with partial reconstitution and  subsequent reocclusion of most M2 branches. Poor collaterals.  Pt s/p revascularization of occluded RT MCA. Cotrak placed 4/22   Assessment / Plan / Recommendation Clinical Impression  Pt with improved alertness this date as compared to previously reported SLP interactions. Pt with eyes open though unable to follow commands. Performed diligent oral care as xersotomia and dried secretions exhibited on dentition and throughout oral cavity. Pt with variable oral readiness though was able to open mouth appropriately during some trials of ice chips and puree consistencies. Pt with oral manipulation of ice chips and puree and intermittent oral expectoration of boluses. Pt did exhibit improved oral acceptance with palpable swallow with puree x5. Pt with intermittent phonation though nonsensical due to aphasia; vocal quality noted to be clear. Trialed water via cup, pt with anterior spillage at midline with some expectoration of thin liquids. Recommend pleasure feeds of puree via floorstock from SLP and staff as pt mentation permits. SLP to follow up for continued intervention.  SLP Visit Diagnosis: Dysphagia, oral phase (R13.11);Dysphagia, unspecified (R13.10)    Aspiration Risk  Mild aspiration risk;Moderate aspiration risk    Diet Recommendation   NPO (pleasure PO trials of puree from floor stock from SLP or staff as mentation permits)       Other  Recommendations Oral Care Recommendations: Oral care QID;Oral care prior to ice chip/H20   Follow up Recommendations   IPR     Frequency and Duration min 2x/week  2 weeks       Prognosis Prognosis for Safe Diet Advancement: Good Barriers to Reach Goals: Language deficits;Time post onset      Swallow Study   General  Date of Onset: 04/02/21 HPI: Pt is a 35 y.o. female with a PMHx of prior strokes x 4 (right cerebellar and right posterior frontal infarctions in 2018 (while smoking and on oral contraceptives), as well as an acute occlusion of her left M2 on 11/09/2020 for which she was treated with thrombectomy  at [facility] with sequelae of aphasia and left sided numbness), chronic MHAs, chronic AC with Eliquis, HLD, ? IIH, DDD, ITP during pregnancy, remote tobacco use, and disorder of optic nerve. Patient presented as a code stroke due to right gaze preference, left sided weakness, aphasia and left facial droop. GN:FAOZH infarcts in the right MCA  territory; YQM:VHQIONGEX of distal right M1 MCA with partial reconstitution and  subsequent reocclusion of most M2 branches. Poor collaterals. Pt s/p revascularization of occluded RT MCA. Cotrak placed 4/22 Type of Study: Bedside Swallow Evaluation Previous Swallow Assessment: no prior swallowing interventino on file Diet Prior to this Study: NPO Temperature Spikes Noted: No Respiratory Status: Room air History of Recent Intubation: No Behavior/Cognition: Alert;Distractible;Doesn't follow directions;Requires cueing Oral Cavity Assessment: Dry;Dried secretions Oral Care Completed by SLP: Yes Oral Cavity - Dentition: Missing dentition;Poor condition Self-Feeding Abilities: Total assist Patient Positioning: Upright in bed Baseline Vocal Quality: Low vocal intensity Volitional Cough: Cognitively unable to elicit Volitional Swallow: Unable to elicit    Oral/Motor/Sensory Function Overall Oral Motor/Sensory Function: Other (comment) (pt unable to follow commands for completion of oral motor exam)   Ice Chips Ice chips: Impaired Presentation: Spoon Oral Phase Impairments: Reduced labial seal;Other (comment) (expectoration of bolus by pt)   Thin Liquid Thin Liquid: Impaired Presentation: Cup Oral Phase Impairments: Reduced labial seal;Reduced lingual movement/coordination Pharyngeal  Phase Impairments: Suspected delayed Swallow;Multiple swallows    Nectar Thick Nectar Thick Liquid: Not tested   Honey Thick Honey Thick Liquid: Not tested   Puree Puree: Impaired Presentation: Spoon Oral Phase Impairments: Reduced lingual movement/coordination Oral Phase  Functional Implications: Prolonged oral transit Pharyngeal Phase Impairments: Suspected delayed Swallow;Multiple swallows   Solid     Solid: Not tested      Ardyth Gal MA, CCC-SLP Acute Rehabilitation Services   04/07/2021,10:38 AM

## 2021-04-07 NOTE — Plan of Care (Signed)
  Problem: Coping: Goal: Will verbalize positive feelings about self Outcome: Progressing Goal: Will identify appropriate support needs Outcome: Progressing   Problem: Ischemic Stroke/TIA Tissue Perfusion: Goal: Complications of ischemic stroke/TIA will be minimized Outcome: Progressing   Problem: Safety: Goal: Ability to remain free from injury will improve Outcome: Progressing

## 2021-04-07 NOTE — TOC Initial Note (Signed)
Transition of Care [facility]) - Initial/Assessment Note    Patient Details  Name: Krista Maddox MRN: [id number] Date of Birth: 24-Jun-1986  Transition of Care [facility]) CM/SW Contact:    Chana Bode, Student-Social Work Phone Number: 04/07/2021, 3:14 PM  Clinical Narrative:    MSW Student spoke with patient's mother about SNF recommendation. She told student that patient probably doesn't want her making any decisions and to call her father instead. She said she doesn't know much of SNFs anyways but is fine with her going to one. MSW Student called father and informed him of SNF recommendation, he said he is fine with her going to a SNF and has no preferences. FL2 faxed out, awaiting bed offers.                Expected Discharge Plan: Skilled Nursing Facility Barriers to Discharge: Continued Medical Work up,Insurance Authorization   Patient Goals and CMS Choice Patient states their goals for this hospitalization and ongoing recovery are:: Unable to assess, patient oriented x1 CMS Medicare.gov Compare Post Acute Care list provided to:: Patient Represenative (must comment) Choice offered to / list presented to : Parent  Expected Discharge Plan and Services Expected Discharge Plan: Skilled Nursing Facility       Living arrangements for the past 2 months: Single Family Home                                      Prior Living Arrangements/Services Living arrangements for the past 2 months: Single Family Home Lives with:: Parents Patient language and need for interpreter reviewed:: No Do you feel safe going back to the place where you live?: Yes      Need for Family Participation in Patient Care: Yes (Comment) Care giver support system in place?: No (comment)   Criminal Activity/Legal Involvement Pertinent to Current Situation/Hospitalization: No - Comment as needed  Activities of Daily Living      Permission Sought/Granted Permission sought to share information with :  Family Electrical engineer Permission granted to share information with : Yes, Verbal Permission Granted  Share Information with NAME: [name]  Permission granted to share info w AGENCY: SNF  Permission granted to share info w Relationship: Father     Emotional Assessment Appearance:: Appears stated age Attitude/Demeanor/Rapport: Unable to Assess Affect (typically observed): Unable to Assess Orientation: : Oriented to Self Alcohol / Substance Use: Not Applicable Psych Involvement: No (comment)  Admission diagnosis:  Acute ischemic right MCA stroke (HCC) [I63.511] Stroke [facility]) [I63.9] Middle cerebral artery embolism, right [I66.01] Patient Active Problem List   Diagnosis Date Noted  . Acute ischemic right MCA stroke (HCC) 04/02/2021  . Stroke (HCC) 04/02/2021  . Middle cerebral artery embolism, right 04/02/2021  . Ptosis of left eyelid 09/18/2019  . Speech disturbance 02/28/2019  . Paresthesia 02/28/2019  . Cerebrovascular accident (HCC) 11/02/2017  . 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 History of TTP (thrombotic thrombocytopenic purpura)   . 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 Numbness on left side 08/16/2017  . Acute left lumbar radiculopathy 10/13/2016  . Chronic migraine 06/01/2016  . Pseudotumor cerebri 11/03/2015  . DENTAL PAIN 06/09/2010  . HEADACHE, TENSION 02/04/2010  . SCABIES 01/13/2010  . FOOT PAIN, LEFT 01/13/2010  . CERUMEN IMPACTION, BILATERAL 11/24/2009  . PSORIASIS 11/24/2009  . Smoker 10/15/2009  . 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 PYELONEPHRITIS  10/01/2009  . THROMBOTIC THROMBOCYTOPENIC PURPURA 12/13/2005   PCP:  Maud Deed, PA Pharmacy:   [facility], [place] - [address] [place] [address] Phone: [phone] Fax: [phone]  [id number] [place], [place] - [address] AT [facility] [address] [place] [address] Phone: [phone] Fax: [phone]  [facility] [place] - [address] [place] [address] Phone: [phone] Fax: [phone]     Social Determinants of Health (SDOH) Interventions    Readmission Risk Interventions No flowsheet data found.

## 2021-04-07 NOTE — [place] FL2 (Addendum)
[place] MEDICAID FL2 LEVEL OF CARE SCREENING TOOL     IDENTIFICATION  Patient Name: Krista Maddox Birthdate: [birth year] Sex: female Admission Date (Current Location): 04/02/2021  [facility] and [place] Number:  Producer, television/film/video and Address:  [facility], [address], [place], [place] [address]      Provider Number: [id number]  Attending Physician Name and Address:  Stroke, Md, MD  Relative Name and Phone Number:       Current Level of Care: Hospital Recommended Level of Care: Skilled Nursing Facility Prior Approval Number:    Date Approved/Denied:   PASRR Number: [id number]  Discharge Plan: SNF    Current Diagnoses: Patient Active Problem List   Diagnosis Date Noted   Acute ischemic right MCA stroke (HCC) 04/02/2021   Stroke (HCC) 04/02/2021   Middle cerebral artery embolism, right 04/02/2021   Ptosis of left eyelid 09/18/2019   Speech disturbance 02/28/2019   Paresthesia 02/28/2019   Cerebrovascular accident (HCC) 11/02/2017   Cerebrovascular accident (CVA) due to thrombosis of anterior cerebral artery (HCC)    History of TTP (thrombotic thrombocytopenic purpura)    Cerebral embolism with cerebral infarction 08/17/2017   Numbness on left side 08/16/2017   Acute left lumbar radiculopathy 10/13/2016   Chronic migraine 06/01/2016   Pseudotumor cerebri 11/03/2015   DENTAL PAIN 06/09/2010   HEADACHE, TENSION 02/04/2010   SCABIES 01/13/2010   FOOT PAIN, LEFT 01/13/2010   CERUMEN IMPACTION, BILATERAL 11/24/2009   PSORIASIS 11/24/2009   Smoker 10/15/2009   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   PYELONEPHRITIS 10/01/2009   THROMBOTIC THROMBOCYTOPENIC PURPURA 12/13/2005    Orientation RESPIRATION BLADDER Height & Weight     Self  Normal Incontinent Weight: 248 lb 14.4 oz (112.9 kg) Height:  5\' 5"  (165.1 cm)  BEHAVIORAL SYMPTOMS/MOOD NEUROLOGICAL BOWEL NUTRITION STATUS      Incontinent Diet (See DC Summary)  AMBULATORY STATUS  COMMUNICATION OF NEEDS Skin   Extensive Assist Verbally Normal                       Personal Care Assistance Level of Assistance  Bathing,Feeding,Dressing Bathing Assistance: Maximum assistance Feeding assistance: Maximum assistance Dressing Assistance: Maximum assistance     Functional Limitations Info  Sight,Hearing,Speech Sight Info: Adequate Hearing Info: Adequate Speech Info: Impaired (Aphasia)    SPECIAL CARE FACTORS FREQUENCY  PT (By licensed PT),OT (By licensed OT),Speech therapy     PT Frequency: 5xweek OT Frequency: 5xweek     Speech Therapy Frequency: 5xweek      Contractures Contractures Info: Not present    Additional Factors Info  Code Status,Allergies Code Status Info: Full Allergies Info: Tape           Current Medications (04/07/2021):  This is the current hospital active medication list Current Facility-Administered Medications  Medication Dose Route Frequency Provider Last Rate Last Admin    stroke: mapping our early stages of recovery book   Does not apply Once Kirby-Graham, 04/09/2021, NP       0.9 %  sodium chloride infusion   Intravenous Continuous Beather Arbour, MD 25 mL/hr at 04/06/21 2325 New Bag at 04/06/21 2325   acetaminophen (TYLENOL) tablet 650 mg  650 mg Oral Q4H PRN 04/08/21, NP       Or   acetaminophen (TYLENOL) 160 MG/5ML solution 650 mg  650 mg Per Tube Q4H PRN Leda Gauze, NP   650 mg at 04/05/21 1454   Or   acetaminophen (  TYLENOL) suppository 650 mg  650 mg Rectal Q4H PRN Kirby-Graham, Beather Arbour, NP       aspirin chewable tablet 162 mg  162 mg Per Tube Daily Eliseo Gum B, MD   162 mg at 04/07/21 1012   atorvastatin (LIPITOR) tablet 80 mg  80 mg Per Tube Daily Lodema Hong A, RPH   80 mg at 04/07/21 1016   chlorhexidine (PERIDEX) 0.12 % solution 15 mL  15 mL Mouth Rinse BID Micki Riley, MD   15 mL at 04/07/21 1016   Chlorhexidine Gluconate Cloth 2 % PADS 6 each  6 each Topical Daily Bhagat, Srishti  L, MD   6 each at 04/07/21 1016   enoxaparin (LOVENOX) injection 40 mg  40 mg Subcutaneous Q24H Eliseo Gum B, MD   40 mg at 04/07/21 1017   ezetimibe (ZETIA) tablet 10 mg  10 mg Per Tube Daily Pierce, Dwayne A, RPH   10 mg at 04/07/21 1016   feeding supplement (OSMOLITE 1.5 CAL) liquid 1,000 mL  1,000 mL Per Tube Continuous Marvel Plan, MD 50 mL/hr at 04/06/21 1500 1,000 mL at 04/06/21 1500   feeding supplement (PROSource TF) liquid 90 mL  90 mL Per Tube BID Marvel Plan, MD   90 mL at 04/07/21 1017   labetalol (NORMODYNE) tablet 100 mg  100 mg Per Tube BID Micki Riley, MD   100 mg at 04/07/21 1012   MEDLINE mouth rinse  15 mL Mouth Rinse q12n4p Micki Riley, MD   15 mL at 04/07/21 1318   pantoprazole sodium (PROTONIX) 40 mg/20 mL oral suspension 40 mg  40 mg Per Tube Daily Marvel Plan, MD   40 mg at 04/07/21 1017   senna-docusate (Senokot-S) tablet 1 tablet  1 tablet Per Tube BID Marvel Plan, MD   1 tablet at 04/07/21 1017   sodium chloride flush (NS) 0.9 % injection 3 mL  3 mL Intravenous Once Margarita Grizzle, MD       topiramate (TOPAMAX) tablet 150 mg  150 mg Per Tube BID Jeanella Craze, Dwayne A, RPH   150 mg at 04/07/21 1014   venlafaxine (EFFEXOR) tablet 75 mg  75 mg Per Tube BID Eliseo Gum B, MD   75 mg at 04/07/21 1013     Discharge Medications: Please see discharge summary for a list of discharge medications.  Relevant Imaging Results:  Relevant Lab Results:   Additional Information SSN: [id number]  Chana Bode, Student-Social Work   I have personally obtained history,examined this patient, reviewed notes, independently viewed imaging studies, participated in medical decision making and plan of care.ROS completed by me personally and pertinent positives fully documented  I have made any additions or clarifications directly to the above note. Agree with note above.  Delia Heady, MD Medical Director [facility] Pager: [phone] 04/08/2021 4:12 PM

## 2021-04-07 NOTE — Plan of Care (Signed)
Pt transferred to 3W18 from ICU around 2330.  Pt alert with intermittent somnolence, responds to physical stimuli with moans/groans, unable to follow commands. Right sided gaze preference. Unable to track or look to left. +L facial droop. VSS O2 stable on RA. Telemetry monitoring in place. NSR/NST. Cortrack remains free floating C/D/I, Osmolite 1.5 continuous @ 48ml/hr. NS IVF@25ml /hr. External urinary catheter with clear, yellow urine noted.  RUE erythema noted. Orders reviewed upon transfer. Aspiration precautions in place. SCD'S on. Pt belongings at bedside (clothing). Pt kept clean and dry. Peri care given as needed. Q2hr turns. Pt currently resting comfortably. Will continue to monitor and follow plan of care.   Problem: Nutrition: Goal: Risk of aspiration will decrease Outcome: Progressing Goal: Dietary intake will improve Outcome: Progressing   Problem: Ischemic Stroke/TIA Tissue Perfusion: Goal: Complications of ischemic stroke/TIA will be minimized Outcome: Progressing   Problem: Clinical Measurements: Goal: Ability to maintain clinical measurements within normal limits will improve Outcome: Progressing   Problem: Activity: Goal: Risk for activity intolerance will decrease Outcome: Progressing   Problem: Nutrition: Goal: Adequate nutrition will be maintained Outcome: Progressing   Problem: Elimination: Goal: Will not experience complications related to bowel motility Outcome: Progressing Goal: Will not experience complications related to urinary retention Outcome: Progressing   Problem: Pain Managment: Goal: General experience of comfort will improve Outcome: Progressing   Problem: Safety: Goal: Ability to remain free from injury will improve Outcome: Progressing   Problem: Skin Integrity: Goal: Risk for impaired skin integrity will decrease Outcome: Progressing

## 2021-04-08 DIAGNOSIS — I63511 Cerebral infarction due to unspecified occlusion or stenosis of right middle cerebral artery: Secondary | ICD-10-CM | POA: Diagnosis not present

## 2021-04-08 LAB — GLUCOSE, CAPILLARY
Glucose-Capillary: 122 mg/dL — ABNORMAL HIGH (ref 70–99)
Glucose-Capillary: 114 mg/dL — ABNORMAL HIGH (ref 70–99)
Glucose-Capillary: 118 mg/dL — ABNORMAL HIGH (ref 70–99)
Glucose-Capillary: 98 mg/dL (ref 70–99)
Glucose-Capillary: 119 mg/dL — ABNORMAL HIGH (ref 70–99)

## 2021-04-08 LAB — BASIC METABOLIC PANEL
Anion gap: 9 (ref 5–15)
BUN: 17 mg/dL (ref 6–20)
CO2: 19 mmol/L — ABNORMAL LOW (ref 22–32)
Calcium: 8.8 mg/dL — ABNORMAL LOW (ref 8.9–10.3)
Chloride: 110 mmol/L (ref 98–111)
Creatinine, Ser: 0.56 mg/dL (ref 0.44–1.00)
GFR, Estimated: >60 mL/min (ref >60)
Glucose, Bld: 111 mg/dL — ABNORMAL HIGH (ref 70–99)
Potassium: 4 mmol/L (ref 3.5–5.1)
Sodium: 138 mmol/L (ref 135–145)

## 2021-04-08 LAB — CBC
HCT: 35.4 % — ABNORMAL LOW (ref 36.0–46.0)
Hemoglobin: 11.3 g/dL — ABNORMAL LOW (ref 12.0–15.0)
MCH: 29.1 pg (ref 26.0–34.0)
MCHC: 31.9 g/dL (ref 30.0–36.0)
MCV: 91.2 fL (ref 80.0–100.0)
Platelets: 187 10*3/uL (ref 150–400)
RBC: 3.88 MIL/uL (ref 3.87–5.11)
RDW: 15.4 % (ref 11.5–15.5)
WBC: 11.7 10*3/uL — ABNORMAL HIGH (ref 4.0–10.5)
nRBC: 0 % (ref 0.0–0.2)

## 2021-04-08 MED ORDER — WARFARIN SODIUM 7.5 MG PO TABS
7.5000 mg | ORAL_TABLET | Freq: Once | ORAL | Status: AC
  Administered 2021-04-08: 7.5 mg
  Filled 2021-04-08: qty 1

## 2021-04-08 MED ORDER — WARFARIN - PHARMACIST DOSING INPATIENT: Freq: Every day | Status: DC

## 2021-04-08 MED ORDER — COUMADIN BOOK
Freq: Once | Status: AC
  Filled 2021-04-08: qty 1

## 2021-04-08 MED ORDER — WARFARIN SODIUM 7.5 MG PO TABS: 7.5000 mg | ORAL_TABLET | Freq: Once | ORAL | Status: DC

## 2021-04-08 NOTE — TOC Progression Note (Signed)
Transition of Care [facility]) - Progression Note    Patient Details  Name: [name] MRN: [id number] Date of Birth: 1986-08-02  Transition of Care [facility]) CM/SW Contact  Chana Bode, Student-Social Work Phone Number: 04/08/2021, 3:38 PM  Clinical Narrative:   MSW Student spoke with patient's father Jorja Loa and gave him SNF bed offers. Jorja Loa is leaning towards [facility] but is currently looking at [facility] to see which one is better for his daughter. Will follow up.     Expected Discharge Plan: Skilled Nursing Facility Barriers to Discharge: Continued Medical Work up,Insurance Authorization  Expected Discharge Plan and Services Expected Discharge Plan: Skilled Nursing Facility       Living arrangements for the past 2 months: Single Family Home                                       Social Determinants of Health (SDOH) Interventions    Readmission Risk Interventions No flowsheet data found.

## 2021-04-08 NOTE — Progress Notes (Addendum)
STROKE TEAM PROGRESS NOTE   INTERVAL HISTORY Patient remains mostly nonverbal and does not follow commands.  Speech therapist reports that patient will make short reactionary statements like what is the name." RN reports that patient has been cleared for dysphagia 1 diet per SLP.  She continues to have right gaze and head deviation and left-sided neglect.  Vital signs stable.  Neurological exam unchanged Vitals:   04/08/21 0352 04/08/21 0733 04/08/21 0835 04/08/21 1034  BP: 113/78  138/85 (!) 146/78  Pulse: (!) 103  (!) 101 (!) 109  Resp: 16  16   Temp: 98.8 F (37.1 C)  98.1 F (36.7 C)   TempSrc: Axillary  Axillary   SpO2: 100%  100%   Weight:  110.9 kg    Height:       CBC:  Recent Labs  Lab 04/02/21 1203 04/02/21 1212 04/03/21 0622 04/04/21 0519 04/07/21 0344 04/08/21 0228  WBC 10.1  --  18.1*   < > 9.9 11.7*  NEUTROABS 6.6  --  15.1*  --   --   --   HGB 13.1   < > 11.6*   < > 10.8* 11.3*  HCT 41.8   < > 35.9*   < > 33.9* 35.4*  MCV 92.9  --  89.8   < > 92.4 91.2  PLT 187  --  189   < > 192 187   < > = values in this interval not displayed.   Basic Metabolic Panel:  Recent Labs  Lab 04/04/21 1629 04/05/21 0711 04/06/21 0846 04/07/21 0344 04/08/21 0228  NA  --  137   < > 136 138  K  --  4.2   < > 4.2 4.0  CL  --  110   < > 107 110  CO2  --  20*   < > 19* 19*  GLUCOSE  --  107*   < > 116* 111*  BUN  --  14   < > 14 17  CREATININE  --  0.64   < > 0.61 0.56  CALCIUM  --  8.4*   < > 9.0 8.8*  MG 1.8 1.7  --   --   --   PHOS 2.3* 2.8  --   --   --    < > = values in this interval not displayed.   Lipid Panel:  Recent Labs  Lab 04/03/21 0622  CHOL 93  TRIG 79  HDL 29*  CHOLHDL 3.2  VLDL 16  LDLCALC 48   HgbA1c:  Recent Labs  Lab 04/03/21 0622  HGBA1C 5.7*   Urine Drug Screen: No results for input(s): LABOPIA, COCAINSCRNUR, LABBENZ, AMPHETMU, THCU, LABBARB in the last 168 hours.  Alcohol Level No results for input(s): ETH in the last 168  hours.  IMAGING past 24 hours No results found.  PHYSICAL EXAM Constitutional:  Obese.  Young Caucasian lady not in distress. Eyes: No scleral injection HENT: No oropharyngeal obstruction.  MSK: no joint deformities.  Cardiovascular:Tachycardiacrate and regular rhythm.  Respiratory: Effort normal, non-labored breathing Skin: Warm dry and intact visible skin  Neuro: Mental Status: Patient is alert eacts to noxious stimuliin all extremities. Patienthasright sided gaze preference.Unable to look to the left. Blinks to threat on the right but not on the left. Does not respond to commands. Left-sided neglect Cranial Nerves: II: Pupils are equal, round, and reactive to light. Right sided gaze preference.Cannottrack examiner.  III,IV, VI: EOMI with right gaze preference does not pass midline  V: No eyelash response on Left. VVO:[id number] droop.  VIII:Does not follow commands, so unable to assess this. X, XI, XII: Does not follow commands.   Motor: Toneis decreased in LUE but bulk areappropriate in all extremities.Localizes pain on LUE, but but it remains externally rotated and flaccid.LLEmaintains horizontal plane movement at the hip. RUE good spontaneous movement against gravity.Good spontaneous movement of the right lower extremity, at least 3/5. Sensory: Reacts equally to noxious stimulation in all 4 extremities.Can localize pain in all 4 extremities. Recoils every extremity in response to pain in LUE it is at the shoulder. Recoils in BLE at the knee and hip.  Cerebellar: FNF and HKS are unable to be performed dueinability to follow commands.  ASSESSMENT/PLAN Ms.Everleigh L Matthewsis a 35 y.o.femalewith history of multiple strokes who presented as a code stroke with symptoms suggestive of LVO. tPA was not given due to Eliquis. CTA head and neck revealed an occlusion of distal right M2 MCA with partial reconstitution and subsequent reocclusion  of most M2 branches. Code IR was called and patient was taken emergently to IR suite. Patient underwent thrombectomy. Patient remains extubated but is not able to follow commands. Core track placed today. Will get repeat CT this AM and if stable will start ASA. Also recommend that patient OP PCP or OBGYN discontinue patient Depo-Provera injections. Patient has had multiple strokes, smokes, has a hx of Pseudotumor cerebri, and has hx of migraines. This medication is contraindicated as it can increase risk for stroke especially in this setting. Patient has been receiving this medication since at least 2019, but patient first stroke was reported in 2018. Patient's physical exam appears to be hindered by behavioral hesitancy to participate in exam. Patient neurological exam remains stable with spontaneous movement of the R side extremities. Patient was able to eat today with SLP which is an improvement. Patient still seems to go against some commands again as she resisted her RUE being raised indicating she has more strength and could likely hold against gravity but is either refusing to or does not understand the commands.    Recurrent cryptogenic embolic strokes -Acute infarct of the R MCA due to right M1 occlusion s/p IR with TICI2c,cryptogenic etiology  Code Stroke:CT head No acuteintracranial hemorrhage.Acute infarcts in the right MCAterritory.ASPECTS8.   CTA head & neckOcclusion of distal right M1 MCA with partial reconstitution and subsequent reocclusion of most M2 branches. Poor collaterals.  CTPerfusion imaging demonstrates core infarction 99 mL and penumbra of 34 mL. The extent of core infarction is calculated at greater than apparent loss of gray-white differentiation on noncontrast head CT. Therefore, penumbra may be greater.  Concern for behavior hindering neurological exam advancement  2D EchoLVEF 60-65%, no structural abnormalities noted that could increase risk for  CVA  Repeat CT Head-large left MCA infarct, no midline shift. Small SAH sylvian fissure, diminishing in size ,4/24  Hypercoagulable panel: Homocysteine serum slightly elevated at 15.1, otherwise negative to date  LDL48  HgbA1c5.7  VTE prophylaxis -SCDs+ Lovenox  aspirin 325 mg daily and Eliquis (apixaban) dailyprior to admission, begin Warfarin 4/27  Consult to Pharm for warfarin initiation goal INR 2.5-3.5  PT-INR scheduled for 4/30  Therapy recommendations:PT/OT/SLP  Disposition:Pending  Tachycardia- improving  CXR: Mild left perihilar and left base subsegmental atelectasis.  Labtealol 100mg  BID  Hyperlipidemia  Home meds:atrovastatin 80mg  and zetia 10mg    Will restart post CT if stable  LDL 48, goal < 70  Resume after cortrak placement  Continue statin at discharge  Dysphagia  Cortrack placementfor TF  SLP following   Birth control  Recommend OP PCP or OBGYN discontinue Depo - provera due to numerous Contraindications including hx of tobacco use, migraines, and multiple strokes.  Tobacco abuse  Current smoker  Smoking cessation counselingwill beprovided  Contraindicated for birth control Depo  Hx of pseudotumor cerebri  [facility] Terrace Arabia [facility]  On topamax 150mg  bid - will continue after cortrak placed  Other Stroke Risk Factors  MorbidObesity,Body mass index is 42.12 kg/m., BMI >/= 30 associated with increased stroke risk, recommend weight loss, diet and exercise as appropriate   Hx strokeandTIA  Migraines ? consult to Pharm for Effexor per tube ? Continue home topamax  Obstructive sleep apnea Hospital day # 6  , MD PGY-1 Continue ongoing treatment and therapies.  Patient is medically stable to transfer to inpatient rehab when bed available.  Discussed with patient and speech therapist.  Greater than 50% time during this 25-minute visit was spent in counseling and coordination of care and  discussion with care team.  Eliseo Gum MD  To contact Stroke Continuity provider, please refer to Delia Heady. After hours, contact General Neurology

## 2021-04-08 NOTE — Progress Notes (Signed)
  Speech Language Pathology Treatment: Dysphagia  Patient Details Name: Krista Maddox MRN: [id number] DOB: December 08, 1986 Today's Date: 04/08/2021 Time: 1010-1100 SLP Time Calculation (min) (ACUTE ONLY): 50 min  Assessment / Plan / Recommendation Clinical Impression  Pt demonstrates improvement with PO trials today, though max to total cueing needed for pt to recognize PO. Pt does not respond to verbal or visual cues (even with focused gaze in right field) to accept PO. Pt will only accept PO with tactile cues: PO touched to tongue. Pt could not take sips from straws but would eventually hold cup with right hand and accept sips given with assist. No signs of aspiration with thin, but only very small quantities taken. Also of note, head turned and tilted firmly right, eliminating impact left labial weakness given baseline position. Pt also tolerated purees and masticated and swallowed small bites of muffin placed in her mouth with total assist. Recommend pt start thins and purees with staff, meds to be give via tube still. Goal is to increase awareness and pleasure with intake, so offer PO, but no need to push intake at this time.   HPI HPI: Pt is a 35 y.o. female with a PMHx of prior strokes x 4 (right cerebellar and right posterior frontal infarctions in 2018 (while smoking and on oral contraceptives), as well as an acute occlusion of her left M2 on 11/09/2020 for which she was treated with thrombectomy at [facility] with sequelae of aphasia and left sided numbness), chronic MHAs, chronic AC with Eliquis, HLD, ? IIH, DDD, ITP during pregnancy, remote tobacco use, and disorder of optic nerve. Patient presented as a code stroke due to right gaze preference, left sided weakness, aphasia and left facial droop. NW:GNFAO infarcts in the right MCA  territory; ZHY:QMVHQIONG of distal right M1 MCA with partial reconstitution and  subsequent reocclusion of most M2 branches. Poor collaterals. Pt s/p revascularization  of occluded RT MCA. Cotrak placed 4/22      SLP Plan  Continue with current plan of care       Recommendations  Diet recommendations: Thin liquid;Dysphagia 1 (puree) Liquids provided via: Straw;Cup;Teaspoon Medication Administration: Via alternative means                Oral Care Recommendations: Oral care BID Follow up Recommendations: Inpatient Rehab Plan: Continue with current plan of care       GO                [name], Riley Nearing 04/08/2021, 1:21 PM

## 2021-04-08 NOTE — Evaluation (Signed)
Speech Language Pathology Evaluation Patient Details Name: MANPREET KEMMER MRN: [id number] DOB: 02/10/1986 Today's Date: 04/08/2021 Time: 1010-1100 SLP Time Calculation (min) (ACUTE ONLY): 50 min  Problem List:  Patient Active Problem List   Diagnosis Date Noted  . Acute ischemic right MCA stroke (HCC) 04/02/2021  . Stroke (HCC) 04/02/2021  . Middle cerebral artery embolism, right 04/02/2021  . Ptosis of left eyelid 09/18/2019  . Speech disturbance 02/28/2019  . Paresthesia 02/28/2019  . Cerebrovascular accident (HCC) 11/02/2017  . 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 History of TTP (thrombotic thrombocytopenic purpura)   . 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 Numbness on left side 08/16/2017  . Acute left lumbar radiculopathy 10/13/2016  . Chronic migraine 06/01/2016  . Pseudotumor cerebri 11/03/2015  . DENTAL PAIN 06/09/2010  . HEADACHE, TENSION 02/04/2010  . SCABIES 01/13/2010  . FOOT PAIN, LEFT 01/13/2010  . CERUMEN IMPACTION, BILATERAL 11/24/2009  . PSORIASIS 11/24/2009  . Smoker 10/15/2009  . 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 PYELONEPHRITIS 10/01/2009  . THROMBOTIC THROMBOCYTOPENIC PURPURA 12/13/2005   Past Medical History:  Past Medical History:  Diagnosis Date  . DDD (degenerative disc disease), lumbar   . Headache   . Other disorders of optic nerve, not elsewhere classified, unspecified eye   . Stroke (HCC)   . TTP (thrombotic thrombocytopenic purpura)    Past Surgical History:  Past Surgical History:  Procedure Laterality Date  . CESAREAN SECTION    . CHOLECYSTECTOMY    . IR CT HEAD LTD  04/02/2021  . IR PERCUTANEOUS ART THROMBECTOMY/INFUSION INTRACRANIAL INC DIAG ANGIO  04/02/2021  . RADIOLOGY WITH ANESTHESIA N/A 04/02/2021   Procedure: IR WITH ANESTHESIA;  Surgeon: Radiologist, Medication, MD;  Location: [facility] OR;  Service: Radiology;  Laterality: N/A;  . TEE WITHOUT CARDIOVERSION  N/A 08/22/2017   Procedure: TRANSESOPHAGEAL ECHOCARDIOGRAM (TEE);  Surgeon: Chilton Si, MD;  Location[facility] ENDOSCOPY;  Service: Cardiovascular;  Laterality: N/A;  . WISDOM TOOTH EXTRACTION     HPI:  Pt is a 35 y.o. female with a PMHx of prior strokes x 4 (right cerebellar and right posterior frontal infarctions in 2018 (while smoking and on oral contraceptives), as well as an acute occlusion of her left M2 on 11/09/2020 for which she was treated with thrombectomy at [facility] with sequelae of aphasia and left sided numbness), chronic MHAs, chronic AC with Eliquis, HLD, ? IIH, DDD, ITP during pregnancy, remote tobacco use, and disorder of optic nerve. Patient presented as a code stroke due to right gaze preference, left sided weakness, aphasia and left facial droop. QH:UTMLY infarcts in the right MCA  territory; YTK:PTWSFKCLE of distal right M1 MCA with partial reconstitution and  subsequent reocclusion of most M2 branches. Poor collaterals. Pt s/p revascularization of occluded RT MCA. Cotrak placed 4/22   Assessment / Plan / Recommendation Clinical Impression  Pt demonstrates severe cognitive impairment appearing to impact her communication ability. Pt has severe left neglect and firmly deviated gaze to right. She only intermittently focuses her attention to people and objects in her right visual field. She does not often initiate movement or speech acts to address her wants and needs and lays in a fixed position. However, with opportunities provided by therapist pt was able to use her RUE to complete a few basic tasks (wiping her mouth, adjusting her blanket, grasping a cup, throwing away trash) and also seemed to notice her environment occasionally (pushed  buttons on bed without purpose). She also  made a few spontaneous  isolated verbal statments including "cold. Im cold. What's your name? I was afraid." With 1:1 cueing and modeling for Yes/No, choice making with repetition, communicative gestures pt  was minimally responsive. Only once did pt repeat the modeled request for a warm blanket. She did not respond to verbal commands with max cueing and modeling. Though aphasia with a right CVA is possible, it may be more likely that severe cognitive impairment in the areas of attention and initaition are impacting pts verbal communication. Regardless, goals are for expression of wants and needs and purposeful behavior. Recommend CIR.    SLP Assessment  SLP Recommendation/Assessment: Patient needs continued Speech Lanaguage Pathology Services SLP Visit Diagnosis: Cognitive communication deficit (R41.841)    Follow Up Recommendations  Inpatient Rehab    Frequency and Duration min 2x/week  2 weeks      SLP Evaluation Cognition  Overall Cognitive Status: Impaired/Different from baseline Arousal/Alertness: Awake/alert Orientation Level: Other (comment) Attention: Focused Focused Attention: Impaired Focused Attention Impairment: Verbal basic;Functional basic Awareness: Impaired Awareness Impairment: Intellectual impairment Problem Solving: Impaired Problem Solving Impairment: Verbal basic;Functional basic       Comprehension  Auditory Comprehension Overall Auditory Comprehension: Impaired Yes/No Questions: Impaired Basic Biographical Questions: 0-25% accurate Basic Immediate Environment Questions: 0-24% accurate Commands: Impaired One Step Basic Commands: 0-24% accurate Conversation: Simple    Expression Verbal Expression Overall Verbal Expression: Impaired Initiation: Impaired Automatic Speech: Social Response Level of Generative/Spontaneous Verbalization: Word;Phrase Repetition: Impaired Level of Impairment: Word level Naming: Not tested Pragmatics: Impairment Impairments: Abnormal affect   Oral / Motor  Oral Motor/Sensory Function Overall Oral Motor/Sensory Function: Other (comment) Motor Speech Overall Motor Speech: Impaired Respiration: Within functional  limits Phonation: Normal Articulation: Impaired Level of Impairment: Word Intelligibility: Intelligible Motor Planning: Impaired Level of Impairment: Word Motor Speech Errors: Unaware   GO                   Harlon Ditty, MA CCC-SLP  Acute Rehabilitation Services Office [phone]  Claudine Mouton 04/08/2021, 1:58 PM

## 2021-04-08 NOTE — Discharge Instructions (Signed)

## 2021-04-08 NOTE — Progress Notes (Signed)
ANTICOAGULATION CONSULT NOTE - Initial Consult  Pharmacy Consult for  Warfarin  Indication:  Acute stroke  Allergies  Allergen Reactions  . Tape Rash    Cannot tolerate paper tape    Patient Measurements: Height: 5\' 5"  (165.1 cm) Weight: 110.9 kg (244 lb 7.8 oz) IBW/kg (Calculated) : 57   Vital Signs: Temp: 98.1 F (36.7 C) (04/27 0835) Temp Source: Axillary (04/27 0835) BP: 146/78 (04/27 1034) Pulse Rate: 109 (04/27 1034)  Labs: Recent Labs    04/06/21 0846 04/07/21 0344 04/08/21 0228  HGB 11.3* 10.8* 11.3*  HCT 35.9* 33.9* 35.4*  PLT 174 192 187  CREATININE 0.63 0.61 0.56    Estimated Creatinine Clearance: 122.9 mL/min (by C-G formula based on SCr of 0.56 mg/dL).   Medical History: Past Medical History:  Diagnosis Date  . DDD (degenerative disc disease), lumbar   . Headache   . Other disorders of optic nerve, not elsewhere classified, unspecified eye   . Stroke (HCC)   . TTP (thrombotic thrombocytopenic purpura)     Medications:  Medications Prior to Admission  Medication Sig Dispense Refill Last Dose  . apixaban (ELIQUIS) 5 MG TABS tablet Take 5 mg by mouth 2 (two) times daily.   UNK at [facility]  . aspirin EC 325 MG tablet Take 1 tablet (325 mg total) by mouth daily. (Patient not taking: Reported on 12/19/2020) 30 tablet 0   . atenolol (TENORMIN) 50 MG tablet Take 50 mg by mouth daily.     02/16/2021 atorvastatin (LIPITOR) 40 MG tablet Take 1 tablet (40 mg total) by mouth daily. (Patient not taking: No sig reported) 90 tablet 3 Not Taking at Unknown time  . atorvastatin (LIPITOR) 80 MG tablet Take 80 mg by mouth daily.     Marland Kitchen co-enzyme Q-10 50 MG capsule Take 50 mg by mouth 2 (two) times daily. (Patient not taking: Reported on 12/19/2020)     . Erenumab-aooe (AIMOVIG) 140 MG/ML SOAJ Inject 140 mg into the skin every 30 (thirty) days. (Patient not taking: Reported on 12/19/2020) 3 mL 0   . ezetimibe (ZETIA) 10 MG tablet Take 10 mg by mouth daily.     02/16/2021 gabapentin (NEURONTIN)  300 MG capsule TAKE 1 CAPSULE BY MOUTH TWICE DAILY, MAY INCREASE TO 2 TWICE DAILY AS NEEDED 120 capsule 11   . Magnesium Oxide 400 (240 Mg) MG TABS Take 400 mg by mouth 2 (two) times daily. 60 tablet 11   . meclizine (ANTIVERT) 12.5 MG tablet Take 12.5 mg by mouth 3 (three) times daily as needed for dizziness. (Patient not taking: Reported on 12/19/2020)     . medroxyPROGESTERone (DEPO-PROVERA) 150 MG/ML injection Inject 150 mg into the muscle every 3 (three) months.     . meloxicam (MOBIC) 15 MG tablet TAKE ONE TABLET DAILY AS NEEDED WITH FOOD (Patient not taking: Reported on 12/19/2020) 90 tablet 0   . nortriptyline (PAMELOR) 50 MG capsule She is going to discuss further refills with her neurologist at [facility]. Her next appt is in June. (Patient not taking: Reported on 12/19/2020) 90 capsule 0   . NURTEC 75 MG TBDP Take 75 mg by mouth at bedtime.     . promethazine (PHENERGAN) 25 MG tablet Take 25 mg by mouth every 6 (six) hours as needed for nausea or vomiting.     . Riboflavin 100 MG TABS Take 1 tablet (100 mg total) by mouth 2 (two) times daily. 60 tablet 11   . topiramate (TOPAMAX) 100 MG tablet TAKE 1 &  1/2 TABLET (150 MG TOTAL) BY MOUTH 2 TIMES DAILY. 270 tablet 3   . venlafaxine XR (EFFEXOR-XR) 150 MG 24 hr capsule Take 150 mg by mouth daily with breakfast.       Assessment:   35 y.o female on  Apixaban PTA for hx prior strokes, ?DVT.  Last dose PTA " unknown", admitted 04/02/21 with acute CVA.  No TPA due to Apixaban pta.   -4/21 s/p thrombectomy. -Neurologist has consulted pharmacist on 04/08/21 to start  Warfarin  goal INR 2.5-3.5 per Dr. Morrie Sheldon.  Baseline INR 1.2 on admit 4/21 LD pta "unkown". Hgb stable in 11s>10s (13.3 on admit), pltc wnl stable .  No acute bleeding noted.   Hypercoagulable panel:  Homocysteine serum slightly elevated at 15.1, otherwise negative to date.   For warfarin naive patient, her Warfarin point score  =8 pts , using her age, weight, and indication.  I will  start warfarin  dose lower than the recommended dose per nomogram due to  acute CVA Monitor for bleeding.    Dr. Pearlean Brownie recommends OP PCP or OBGYN to d/c Depo-provera due to numerous contraindications including h/o multiple strokes, tobacco use and migraines.     Goal of Therapy:  INR 2.5-3.5 per Neurologist  Monitor platelets by anticoagulation protocol: Yes   Plan:  Give Warfarin 7.5 mg per tube today x1 Daily INR  Monitor for bleeding Warfarin education book/info sheet requested.  Will educate patient /or family caregiver. Lovenox 40mg  sq daily for VTE prophylaxis continues until INR =or >2.5 then will need to stop. Currently remains on ASA 162 mg per tube daily.  Follow up plan once INR therapeutic.    Thank you for allowing pharmacy to be part of this patients care team.  , RPh Clinical Pharmacist [phone] Please check AMION for all [facility] phone numbers After 10:00 PM, call Main Pharmacy [phone] 04/08/2021,11:25 AM

## 2021-04-09 DIAGNOSIS — I63511 Cerebral infarction due to unspecified occlusion or stenosis of right middle cerebral artery: Secondary | ICD-10-CM | POA: Diagnosis not present

## 2021-04-09 LAB — PROTIME-INR
INR: 1.1 (ref 0.8–1.2)
Prothrombin Time: 13.8 seconds (ref 11.4–15.2)

## 2021-04-09 LAB — GLUCOSE, CAPILLARY
Glucose-Capillary: 107 mg/dL — ABNORMAL HIGH (ref 70–99)
Glucose-Capillary: 113 mg/dL — ABNORMAL HIGH (ref 70–99)
Glucose-Capillary: 114 mg/dL — ABNORMAL HIGH (ref 70–99)
Glucose-Capillary: 102 mg/dL — ABNORMAL HIGH (ref 70–99)
Glucose-Capillary: 113 mg/dL — ABNORMAL HIGH (ref 70–99)
Glucose-Capillary: 126 mg/dL — ABNORMAL HIGH (ref 70–99)

## 2021-04-09 LAB — URINALYSIS, ROUTINE W REFLEX MICROSCOPIC
Bilirubin Urine: NEGATIVE
Glucose, UA: NEGATIVE mg/dL
Hgb urine dipstick: NEGATIVE
Ketones, ur: NEGATIVE mg/dL
Leukocytes,Ua: NEGATIVE
Nitrite: NEGATIVE
Protein, ur: NEGATIVE mg/dL
Specific Gravity, Urine: 1.025 (ref 1.005–1.030)
pH: 6 (ref 5.0–8.0)

## 2021-04-09 MED ORDER — WARFARIN SODIUM 7.5 MG PO TABS: 7.5000 mg | ORAL_TABLET | Freq: Once | ORAL | Status: DC

## 2021-04-09 MED ORDER — WARFARIN SODIUM 7.5 MG PO TABS
7.5000 mg | ORAL_TABLET | Freq: Once | ORAL | Status: AC
  Administered 2021-04-09: 7.5 mg
  Filled 2021-04-09: qty 1

## 2021-04-09 NOTE — Progress Notes (Signed)
Physical Therapy Treatment Patient Details Name: Krista Maddox MRN: [id number] DOB: [birth year] Today's Date: 04/09/2021    History of Present Illness The pt is a 35 yo female presenting with L sided facial droop and L sided weakness on 4/21. Underwent angiogram and revascularization on 4/21. 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    PT Comments    On arrival, patient with R gaze preference and does not respond to L with auditory stimuli. Patient totalA+2 for rolling to perform pericare. Non-purposeful movements throughout and not following commands. Positioned patient in modified chair position with towel roll on R side of head/neck to stretch cervical spine due to limited cervical rotation to L. Patient with grimacing with cervical movement to L. Continue to recommend SNF for ongoing Physical Therapy.      Follow Up Recommendations  SNF;Supervision/Assistance - 24 hour     Equipment Recommendations  Other (comment) (defer to post acute rehab)    Recommendations for Other Services       Precautions / Restrictions Precautions Precautions: Fall Precaution Comments: R gaze, L hemi Restrictions Weight Bearing Restrictions: No    Mobility  Bed Mobility Overal bed mobility: Needs Assistance Bed Mobility: Rolling Rolling: Total assist;+2 for physical assistance;+2 for safety/equipment         General bed mobility comments: totalA+2 for rolling to perform pericare. No initiation from patient noted. Repositioned patient in modified chair position and towel roll on R side of head/neck to prevent heavy R cervical rotation    Transfers                    Ambulation/Gait                 Stairs             Wheelchair Mobility    Modified Rankin (Stroke Patients Only) Modified Rankin (Stroke Patients Only) Pre-Morbid Rankin Score: Moderate disability Modified Rankin: Severe disability      Balance                                            Cognition Arousal/Alertness: Awake/alert Behavior During Therapy: Flat affect Overall Cognitive Status: Impaired/Different from baseline                                 General Comments: non verbal this session. Not following commands. Able to open mouth for oral care. Non purposeful movements throughout      Exercises      General Comments General comments (skin integrity, edema, etc.): VSS on RA      Pertinent Vitals/Pain Pain Assessment: Faces Faces Pain Scale: Hurts little more Pain Location: grimacing with head/neck rotation Pain Descriptors / Indicators: Grimacing;Guarding Pain Intervention(s): Monitored during session;Repositioned    Home Living                      Prior Function            PT Goals (current goals can now be found in the care plan section) Acute Rehab PT Goals Patient Stated Goal: unable PT Goal Formulation: With patient Time For Goal Achievement: 04/17/21 Potential to Achieve Goals: Fair Progress towards PT goals: Not progressing toward goals - comment    Frequency  Min 3X/week      PT Plan Current plan remains appropriate    Co-evaluation PT/OT/SLP Co-Evaluation/Treatment: Yes Reason for Co-Treatment: Necessary to address cognition/behavior during functional activity;For patient/therapist safety;To address functional/ADL transfers PT goals addressed during session: Mobility/safety with mobility;Strengthening/ROM        AM-PAC PT "6 Clicks" Mobility   Outcome Measure  Help needed turning from your back to your side while in a flat bed without using bedrails?: Total Help needed moving from lying on your back to sitting on the side of a flat bed without using bedrails?: Total Help needed moving to and from a bed to a chair (including a wheelchair)?: Total Help needed standing up from a chair using your arms (e.g., wheelchair or bedside  chair)?: Total Help needed to walk in hospital room?: Total Help needed climbing 3-5 steps with a railing? : Total 6 Click Score: 6    End of Session   Activity Tolerance: Patient tolerated treatment well Patient left: in bed;with call bell/phone within reach;with bed alarm set Nurse Communication: Mobility status;Other (comment) (PRAFO on L foot) PT Visit Diagnosis: Hemiplegia and hemiparesis;Other abnormalities of gait and mobility (R26.89) Hemiplegia - Right/Left: Left Hemiplegia - dominant/non-dominant: Non-dominant Hemiplegia - caused by: Cerebral infarction     Time: 0086-7619 PT Time Calculation (min) (ACUTE ONLY): 35 min  Charges:  $Therapeutic Activity: 8-22 mins                     Krista Maddox A. Krista Maddox PT, DPT Acute Rehabilitation Services Pager [phone] Office [phone]    Viviann Spare 04/09/2021, 12:57 PM

## 2021-04-09 NOTE — Progress Notes (Signed)
ANTICOAGULATION CONSULT NOTE - Initial Consult  Pharmacy Consult for  Warfarin  Indication:  Acute stroke  Allergies  Allergen Reactions  . Tape Rash    Cannot tolerate paper tape    Patient Measurements: Height: 5\' 5"  (165.1 cm) Weight: 110.9 kg (244 lb 7.8 oz) IBW/kg (Calculated) : 57   Vital Signs: Temp: 98.9 F (37.2 C) (04/28 0731) Temp Source: Oral (04/28 0731) BP: 122/79 (04/28 0731) Pulse Rate: 95 (04/28 0731)  Labs: Recent Labs    04/06/21 0846 04/07/21 0344 04/08/21 0228 04/09/21 0405  HGB 11.3* 10.8* 11.3*  --   HCT 35.9* 33.9* 35.4*  --   PLT 174 192 187  --   LABPROT  --   --   --  13.8  INR  --   --   --  1.1  CREATININE 0.63 0.61 0.56  --     Estimated Creatinine Clearance: 122.9 mL/min (by C-G formula based on SCr of 0.56 mg/dL).   Medical History: Past Medical History:  Diagnosis Date  . DDD (degenerative disc disease), lumbar   . Headache   . Other disorders of optic nerve, not elsewhere classified, unspecified eye   . Stroke (HCC)   . TTP (thrombotic thrombocytopenic purpura)     Medications:  Medications Prior to Admission  Medication Sig Dispense Refill Last Dose  . apixaban (ELIQUIS) 5 MG TABS tablet Take 5 mg by mouth 2 (two) times daily.   UNK at [facility]  . aspirin EC 325 MG tablet Take 1 tablet (325 mg total) by mouth daily. (Patient not taking: Reported on 12/19/2020) 30 tablet 0   . atenolol (TENORMIN) 50 MG tablet Take 50 mg by mouth daily.     02/16/2021 atorvastatin (LIPITOR) 40 MG tablet Take 1 tablet (40 mg total) by mouth daily. (Patient not taking: No sig reported) 90 tablet 3 Not Taking at Unknown time  . atorvastatin (LIPITOR) 80 MG tablet Take 80 mg by mouth daily.     Marland Kitchen co-enzyme Q-10 50 MG capsule Take 50 mg by mouth 2 (two) times daily. (Patient not taking: Reported on 12/19/2020)     . Erenumab-aooe (AIMOVIG) 140 MG/ML SOAJ Inject 140 mg into the skin every 30 (thirty) days. (Patient not taking: Reported on 12/19/2020) 3 mL 0   .  ezetimibe (ZETIA) 10 MG tablet Take 10 mg by mouth daily.     02/16/2021 gabapentin (NEURONTIN) 300 MG capsule TAKE 1 CAPSULE BY MOUTH TWICE DAILY, MAY INCREASE TO 2 TWICE DAILY AS NEEDED 120 capsule 11   . Magnesium Oxide 400 (240 Mg) MG TABS Take 400 mg by mouth 2 (two) times daily. 60 tablet 11   . meclizine (ANTIVERT) 12.5 MG tablet Take 12.5 mg by mouth 3 (three) times daily as needed for dizziness. (Patient not taking: Reported on 12/19/2020)     . medroxyPROGESTERone (DEPO-PROVERA) 150 MG/ML injection Inject 150 mg into the muscle every 3 (three) months.     . meloxicam (MOBIC) 15 MG tablet TAKE ONE TABLET DAILY AS NEEDED WITH FOOD (Patient not taking: Reported on 12/19/2020) 90 tablet 0   . nortriptyline (PAMELOR) 50 MG capsule She is going to discuss further refills with her neurologist at [facility]. Her next appt is in June. (Patient not taking: Reported on 12/19/2020) 90 capsule 0   . NURTEC 75 MG TBDP Take 75 mg by mouth at bedtime.     . promethazine (PHENERGAN) 25 MG tablet Take 25 mg by mouth every 6 (six) hours as needed  for nausea or vomiting.     . Riboflavin 100 MG TABS Take 1 tablet (100 mg total) by mouth 2 (two) times daily. 60 tablet 11   . topiramate (TOPAMAX) 100 MG tablet TAKE 1 & 1/2 TABLET (150 MG TOTAL) BY MOUTH 2 TIMES DAILY. 270 tablet 3   . venlafaxine XR (EFFEXOR-XR) 150 MG 24 hr capsule Take 150 mg by mouth daily with breakfast.       Assessment:   35 y.o female on  Apixaban PTA for hx prior strokes, ?DVT.  Last dose PTA " unknown", admitted 04/02/21 with acute CVA.  No TPA due to Apixaban pta.   -4/21 s/p thrombectomy. -Neurologist has consulted pharmacist on 04/08/21 to start  Warfarin  goal INR 2.5-3.5 per Dr. Morrie Sheldon.  Baseline INR 1.2 on admit 4/21 LD pta "unkown". Hgb stable in 11s>10s (13.3 on admit), pltc wnl stable .  No acute bleeding noted.   Hypercoagulable panel:  Homocysteine serum slightly elevated at 15.1, otherwise negative to date.   For warfarin naive  patient, her Warfarin point score  =8 pts , using her age, weight, and indication.  I will start warfarin  dose lower than the recommended dose per nomogram due to  acute CVA Monitor for bleeding.    Dr. Pearlean Brownie recommends OP PCP or OBGYN to d/c Depo-provera due to numerous contraindications including h/o multiple strokes, tobacco use and migraines.    INR today 1.1   Goal of Therapy:  INR 2.5-3.5 per Neurologist  Monitor platelets by anticoagulation protocol: Yes   Plan:  Give Warfarin 7.5 mg per tube today x1 Daily INR  Monitor for bleeding Warfarin education book/info sheet requested.  Will educate patient /or family caregiver. Lovenox 40mg  sq daily for VTE prophylaxis continues until INR =or >2.5 then will need to stop. Currently remains on ASA 162 mg per tube daily.  Follow up plan once INR therapeutic.    Lyndel Sarate A. , PharmD, BCPS, FNKF Clinical Pharmacist [place] Please utilize Amion for appropriate phone number to reach the unit pharmacist [facility])

## 2021-04-09 NOTE — Progress Notes (Addendum)
STROKE TEAM PROGRESS NOTE   INTERVAL HISTORY Patient non verbal today. Patient continues to have spontaneous movement of the RUE and RLE. Patient is able to bring her finger to her nose purposely. Patient diet advanced to DYs 1 by SLP.   Vitals:   04/08/21 2352 04/09/21 0309 04/09/21 0731 04/09/21 1133  BP: (!) 112/98 117/70 122/79 119/87  Pulse: (!) 109 98 95 (!) 101  Resp: 19 16 16 20   Temp: 99.1 F (37.3 C) 98.6 F (37 C) 98.9 F (37.2 C) 98.9 F (37.2 C)  TempSrc: Axillary Axillary Oral Oral  SpO2: 99% 98% 99% 98%  Weight:      Height:       CBC:  Recent Labs  Lab 04/03/21 0622 04/04/21 0519 04/07/21 0344 04/08/21 0228  WBC 18.1*   < > 9.9 11.7*  NEUTROABS 15.1*  --   --   --   HGB 11.6*   < > 10.8* 11.3*  HCT 35.9*   < > 33.9* 35.4*  MCV 89.8   < > 92.4 91.2  PLT 189   < > 192 187   < > = values in this interval not displayed.   Basic Metabolic Panel:  Recent Labs  Lab 04/04/21 1629 04/05/21 0711 04/06/21 0846 04/07/21 0344 04/08/21 0228  NA  --  137   < > 136 138  K  --  4.2   < > 4.2 4.0  CL  --  110   < > 107 110  CO2  --  20*   < > 19* 19*  GLUCOSE  --  107*   < > 116* 111*  BUN  --  14   < > 14 17  CREATININE  --  0.64   < > 0.61 0.56  CALCIUM  --  8.4*   < > 9.0 8.8*  MG 1.8 1.7  --   --   --   PHOS 2.3* 2.8  --   --   --    < > = values in this interval not displayed.   Lipid Panel:  Recent Labs  Lab 04/03/21 0622  CHOL 93  TRIG 79  HDL 29*  CHOLHDL 3.2  VLDL 16  LDLCALC 48   HgbA1c:  Recent Labs  Lab 04/03/21 0622  HGBA1C 5.7*   Urine Drug Screen: No results for input(s): LABOPIA, COCAINSCRNUR, LABBENZ, AMPHETMU, THCU, LABBARB in the last 168 hours.  Alcohol Level No results for input(s): ETH in the last 168 hours.  IMAGING past 24 hours No results found.  PHYSICAL EXAM PHYSICAL EXAM Constitutional: Obese.Young Caucasian lady not in distress. Eyes: No scleral injection HENT: No oropharyngeal obstruction.  MSK: no joint  deformities.  Cardiovascular:Tachycardiacrate and regular rhythm.  Respiratory: Effort normal, non-labored breathing Skin: Warm dry and intact visible skin  Neuro: Mental Status: Patient is alert eacts to noxious stimuliin all extremities. Patienthasright sided gaze preference.Unable to look to the left. Blinks to threat on the right but not on the left. Does not respond to commands. Left-sided neglect Cranial Nerves: II: Pupils are 37mm equal, round, and reactive to light. Right sided gaze preference.Cannottrack examiner.  III,IV, VI: EOMI with right gaze preference does not pass midline V: No eyelash response on Left. 11m droop.  VIII:Does not follow commands, so unable to assess this. X, XI, XII: Does not follow commands.   Motor: Toneis decreased in LUE but bulk areappropriate in all extremities.Localizes pain on LUE, but but it remains externally rotated and flaccid.LLEmaintains  horizontal plane movementat the hip. RUE good spontaneous movement against gravity, 3/5.Good spontaneous movement of the right lower extremity, at least 3/5 able to maintain for at least 5 seconds today. Sensory: Reacts equally to noxious stimulation in all 4 extremities.Can localize pain in all 4 extremities. Recoils every extremity in response to pain in LUE it is at the shoulder. Recoils in BLE at the knee and hip.  Cerebellar: Does not doe RUE on command but did do well spontaneously as patient was intending to pick at her NG tube. Obvious coordination noted.  ASSESSMENT/PLAN Krista L Matthewsis a 35 y.o.femalewith history of multiple strokes who presented as a code stroke with symptoms suggestive of LVO. tPA was not given due to Eliquis. CTA head and neck revealed an occlusion of distal right M2 MCA with partial reconstitution and subsequent reocclusion of most M2 branches. Code IR was called and patient was taken emergently to IR suite. Patient  underwent thrombectomy. Patient remains extubated but is not able to follow commands. Core track placed today. Will get repeat CT this AM and if stable will start ASA. Also recommend that patient OP PCP or OBGYN discontinue patient Depo-Provera injections. Patient has had multiple strokes, smokes, has a hx of Pseudotumor cerebri, and has hx of migraines. This medication is contraindicated as it can increase risk for stroke especially in this setting. Patient has been receiving this medication since at least 2019, but patient first stroke was reported in 2018. Patient neurological exam remains stable. Patient PO diet is advancing patient will become a more promising candidate for SNF now that she is able to eat some without NG tube. Patient family is working on [employer] preference.   Recurrent cryptogenic embolic strokes -Acute infarct of the R MCA due to right M1 occlusion s/p IR with TICI2c,cryptogenic etiology  Code Stroke:CT head No acuteintracranial hemorrhage.Acute infarcts in the right MCAterritory.ASPECTS8.   CTA head & neckOcclusion of distal right M1 MCA with partial reconstitution and subsequent reocclusion of most M2 branches. Poor collaterals.  CTPerfusion imaging demonstrates core infarction 99 mL and penumbra of 34 mL. The extent of core infarction is calculated at greater than apparent loss of gray-white differentiation on noncontrast head CT. Therefore, penumbra may be greater.  Concern for behavior hindering neurological exam advancement  2D EchoLVEF 60-65%, no structural abnormalities noted that could increase risk for CVA  Repeat CT Head-large left MCA infarct, no midline shift. Small SAH sylvian fissure, diminishing in size,4/24  Hypercoagulable panel: Homocysteine serum slightly elevated at 15.1, otherwise negative to date  LDL48  HgbA1c5.7  VTE prophylaxis -SCDs+ Lovenox  aspirin 325 mg daily and Eliquis (apixaban) dailyprior to admission, began  Warfarin 4/27  Consult to Pharm for warfarin initiation goal INR 2.5-3.5  PT-INR scheduled for 4/30  Therapy recommendations:PT/OT/SLP  Disposition:Pending  Leukocytosis Afebrile - WBC 11.7 - UA pending  Tachycardia- improving  Labtealol 100mg  BID  Hyperlipidemia  Home meds:atrovastatin 80mg  and zetia 10mg    Will restart post CT if stable  LDL 48, goal < 70  Resume after cortrak placement  Continue statin at discharge  Dysphagia  Cortrack placementfor TF  SLP following   Birth control  Recommend OP PCP or OBGYN discontinue Depo - provera due to numerous Contraindications including hx of tobacco use, migraines, and multiple strokes.  Tobacco abuse  Current smoker  Smoking cessation counselingwill beprovided  Contraindicated for birth control Depo  Hx of pseudotumor cerebri  [facility] with Dr. at [facility]  On topamax 150mg  bid - will continue  after cortrak placed  Other Stroke Risk Factors  MorbidObesity,Body mass index is 42.12 kg/m., BMI >/= 30 associated with increased stroke risk, recommend weight loss, diet and exercise as appropriate   Hx strokeandTIA  Migraines ? consult to Pharm for Effexor per tube ? Continue home topamax  Obstructive sleep apnea   Hospital day # 7  Eliseo Gum, MD PGY-1 I have personally obtained history,examined this patient, reviewed notes, independently viewed imaging studies, participated in medical decision making and plan of care.ROS completed by me personally and pertinent positives fully documented  I have made any additions or clarifications directly to the above note. Agree with note above.   Delia Heady, MD Medical Director [facility] Pager: [phone] 04/09/2021 2:39 PM  To contact Stroke Continuity provider, please refer to [email or web address]. After hours, contact General Neurology

## 2021-04-09 NOTE — Progress Notes (Signed)
Occupational Therapy Treatment Patient Details Name: Krista Maddox MRN: [id number] DOB: 1986-06-29 Today's Date: 04/09/2021    History of present illness The pt is a 35 yo female presenting with L sided facial droop and L sided weakness on 4/21. Underwent angiogram and revascularization on 4/21. 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   OT comments  Pt tolerated therapy without progress towards OT goals this session. Pt continues with R gaze and R head turn. Pt with spontaneous but non-purposeful movement on the R and L side flaccid - did not respond to deep nailbed pressure today. Pt total A +2 for rolling at bed level to clean up big bowel movement. Blanket roll placed to assist with cervical movement to the left. Pt non-focal throughout session (different from SLP session earlier today) non verbal throughout. PROM for BUE, Pt in chair position at the end of session with LUE propped up on pillow. OT will continue to follow acutely.    Follow Up Recommendations  SNF;Supervision/Assistance - 24 hour    Equipment Recommendations  Other (comment) (TBD)    Recommendations for Other Services      Precautions / Restrictions Precautions Precautions: Fall Precaution Comments: R gaze, L hemi Restrictions Weight Bearing Restrictions: No       Mobility Bed Mobility Overal bed mobility: Needs Assistance Bed Mobility: Rolling Rolling: Total assist;+2 for physical assistance;+2 for safety/equipment         General bed mobility comments: totalA+2 for rolling to perform pericare. No initiation from patient noted. Repositioned patient in modified chair position and towel roll on R side of head/neck to prevent heavy R cervical rotation    Transfers                 General transfer comment: unable    Balance                                           ADL either performed or assessed with clinical judgement    ADL Overall ADL's : Needs assistance/impaired     Grooming: Wash/dry face;Total assistance Grooming Details (indicate cue type and reason): R UE HOH support to bring towards face but resistant to movement; overall total assist                               General ADL Comments: Total A     Vision   Vision Assessment?: Vision impaired- to be further tested in functional context Additional Comments: R head turn and gaze, pt non focal and not tracking   Perception     Praxis      Cognition Arousal/Alertness: Awake/alert Behavior During Therapy: Flat affect Overall Cognitive Status: Impaired/Different from baseline                                 General Comments: non verbal this session. Not following commands. Able to open mouth for oral care. Non purposeful movements throughout        Exercises Exercises: General Upper Extremity General Exercises - Upper Extremity Shoulder Flexion: PROM;Both;10 reps (bed in chair position) Elbow Flexion: PROM;Both;10 reps (bed in chair position) Elbow Extension: PROM;Both;10 reps (bed in chair position) Wrist Flexion: PROM;Both;10 reps (bed in chair position)  Wrist Extension: PROM;Both;10 reps (bed in chair position) Digit Composite Flexion: Both;10 reps;PROM (bed in chair position)   Shoulder Instructions       General Comments VSS on RA; attempted to stimulate with music - initially Pt tapping R foot but likely spontaneous non-purposeful movement; Pt in chair position in bed at the end of session    Pertinent Vitals/ Pain       Pain Assessment: Faces Faces Pain Scale: Hurts little more Pain Location: grimacing with head/neck rotation Pain Descriptors / Indicators: Grimacing;Guarding Pain Intervention(s): Limited activity within patient's tolerance;Monitored during session;Repositioned  Home Living                                          Prior Functioning/Environment               Frequency  Min 2X/week        Progress Toward Goals  OT Goals(current goals can now be found in the care plan section)  Progress towards OT goals: Not progressing toward goals - comment (limited engagement in therapy session today)  Acute Rehab OT Goals Patient Stated Goal: unable OT Goal Formulation: Patient unable to participate in goal setting Time For Goal Achievement: 04/17/21 Potential to Achieve Goals: Fair  Plan Discharge plan remains appropriate;Frequency remains appropriate    Co-evaluation    PT/OT/SLP Co-Evaluation/Treatment: Yes Reason for Co-Treatment: Complexity of the patient's impairments (multi-system involvement);For patient/therapist safety;To address functional/ADL transfers;Necessary to address cognition/behavior during functional activity PT goals addressed during session: Mobility/safety with mobility;Balance;Strengthening/ROM OT goals addressed during session: ADL's and self-care      AM-PAC OT "6 Clicks" Daily Activity     Outcome Measure   Help from another person eating meals?: Total Help from another person taking care of personal grooming?: Total Help from another person toileting, which includes using toliet, bedpan, or urinal?: Total Help from another person bathing (including washing, rinsing, drying)?: Total Help from another person to put on and taking off regular upper body clothing?: Total Help from another person to put on and taking off regular lower body clothing?: Total 6 Click Score: 6    End of Session    OT Visit Diagnosis: Other abnormalities of gait and mobility (R26.89);Muscle weakness (generalized) (M62.81);Low vision, both eyes (H54.2);Other symptoms and signs involving cognitive function;Cognitive communication deficit (R41.841);Hemiplegia and hemiparesis Symptoms and signs involving cognitive functions: Cerebral infarction Hemiplegia - Right/Left: Left Hemiplegia - caused by: Cerebral infarction   Activity  Tolerance Patient tolerated treatment well   Patient Left in bed;with call bell/phone within reach;with bed alarm set   Nurse Communication Mobility status        Time: 9798-9211 OT Time Calculation (min): 34 min  Charges: OT General Charges $OT Visit: 1 Visit OT Treatments $Self Care/Home Management : 8-22 mins  Nyoka Cowden OTR/L Acute Rehabilitation Services Pager: [phone] Office: [phone]  Evern Bio Judianne Seiple 04/09/2021, 1:43 PM

## 2021-04-09 NOTE — Progress Notes (Addendum)
  Speech Language Pathology Treatment: Cognitive-Linquistic;Dysphagia  Patient Details Name: Krista Maddox MRN: [id number] DOB: [birth year] Today's Date: 04/09/2021 Time: 8588-5027 SLP Time Calculation (min) (ACUTE ONLY): 37 min  Assessment / Plan / Recommendation Clinical Impression  Pt making small gains with fleeting moments of focused attention today. SLP reduced all visual distractions in right visual field, repositioned to chair position, supported head with rolled towel on right; pt did adjust gaze almost to midline several times with visual, but not auditory stimuli. Pt focused attention to wipe mouth with a rag, grasped orange juice and took a sip with hand over hand assist. Pt made a nasty face when she saw her cream of wheat and smiled, laughed and said "doggie" while sustaining attention to a funny animal video for 12 seconds. She reported "cold" and told me "you're the best." There were instances when pt started to initiate speech, but again suspect attention to task instantly lost.  Other than the one instance of taking a sip of orange juice pt still does not sufficiently recognize or attend to PO to initiate self feeding. It appeared to help her to see the juice inside the cup. She needed siphoning from a straw to initiate sips of ginger ale in a lidded cup. Suspect she does not recognize the straw or cup or focus long enough to comprehend their use or recall the taste of soda from only a second before. Though she is physically capable of sipping swallowing and masticating if food is placed in her mouth, it is very difficult even for SLP and intake is expected to minimal and very effortful even with total assist feeding. Left lidded cup in her visual field with the hope she may see it and use it independently at times.    HPI HPI: Pt is a 35 y.o. female with a PMHx of prior strokes x 4 (right cerebellar and right posterior frontal infarctions in 2018 (while smoking and on oral  contraceptives), as well as an acute occlusion of her left M2 on 11/09/2020 for which she was treated with thrombectomy at [facility] with sequelae of aphasia and left sided numbness), chronic MHAs, chronic AC with Eliquis, HLD, ? IIH, DDD, ITP during pregnancy, remote tobacco use, and disorder of optic nerve. Patient presented as a code stroke due to right gaze preference, left sided weakness, aphasia and left facial droop. XA:JOINO infarcts in the right MCA  territory; MVE:HMCNOBSJG of distal right M1 MCA with partial reconstitution and  subsequent reocclusion of most M2 branches. Poor collaterals. Pt s/p revascularization of occluded RT MCA. Cotrak placed 4/22      SLP Plan  Continue with current plan of care       Recommendations  Diet recommendations: Thin liquid;Dysphagia 1 (puree) Liquids provided via: Straw;Cup;Teaspoon Medication Administration: Via alternative means Supervision: Staff to assist with self feeding;Full supervision/cueing for compensatory strategies Compensations: Minimize environmental distractions                Oral Care Recommendations: Oral care BID Follow up Recommendations: Inpatient Rehab SLP Visit Diagnosis: Cognitive communication deficit (G83.662) Plan: Continue with current plan of care       GO                Asahd Can, Riley Nearing 04/09/2021, 9:59 AM

## 2021-04-09 NOTE — Plan of Care (Signed)
No acute events this shift  Problem: Education: Goal: Knowledge of disease or condition will improve Outcome: Progressing Goal: Knowledge of secondary prevention will improve Outcome: Progressing Goal: Knowledge of patient specific risk factors addressed and post discharge goals established will improve Outcome: Progressing Goal: Individualized Educational Video(s) Outcome: Progressing   Problem: Coping: Goal: Will verbalize positive feelings about self Outcome: Progressing Goal: Will identify appropriate support needs Outcome: Progressing   Problem: Health Behavior/Discharge Planning: Goal: Ability to manage health-related needs will improve Outcome: Progressing   Problem: Self-Care: Goal: Verbalization of feelings and concerns over difficulty with self-care will improve Outcome: Progressing Goal: Ability to communicate needs accurately will improve Outcome: Progressing   Problem: Nutrition: Goal: Risk of aspiration will decrease Outcome: Progressing Goal: Dietary intake will improve Outcome: Progressing   Problem: Ischemic Stroke/TIA Tissue Perfusion: Goal: Complications of ischemic stroke/TIA will be minimized Outcome: Progressing   Problem: Education: Goal: Knowledge of General Education information will improve Description: Including pain rating scale, medication(s)/side effects and non-pharmacologic comfort measures Outcome: Progressing   Problem: Health Behavior/Discharge Planning: Goal: Ability to manage health-related needs will improve Outcome: Progressing   Problem: Clinical Measurements: Goal: Ability to maintain clinical measurements within normal limits will improve Outcome: Progressing Goal: Will remain free from infection Outcome: Progressing Goal: Diagnostic test results will improve Outcome: Progressing Goal: Respiratory complications will improve Outcome: Progressing Goal: Cardiovascular complication will be avoided Outcome: Progressing    Problem: Activity: Goal: Risk for activity intolerance will decrease Outcome: Progressing   Problem: Nutrition: Goal: Adequate nutrition will be maintained Outcome: Progressing   Problem: Coping: Goal: Level of anxiety will decrease Outcome: Progressing   Problem: Elimination: Goal: Will not experience complications related to bowel motility Outcome: Progressing Goal: Will not experience complications related to urinary retention Outcome: Progressing   Problem: Pain Managment: Goal: General experience of comfort will improve Outcome: Progressing   Problem: Safety: Goal: Ability to remain free from injury will improve Outcome: Progressing   Problem: Skin Integrity: Goal: Risk for impaired skin integrity will decrease Outcome: Progressing

## 2021-04-10 DIAGNOSIS — I63511 Cerebral infarction due to unspecified occlusion or stenosis of right middle cerebral artery: Secondary | ICD-10-CM | POA: Diagnosis not present

## 2021-04-10 LAB — CBC WITH DIFFERENTIAL/PLATELET
Abs Immature Granulocytes: 0.05 10*3/uL (ref 0.00–0.07)
Basophils Absolute: 0.1 10*3/uL (ref 0.0–0.1)
Basophils Relative: 1 %
Eosinophils Absolute: 0.2 10*3/uL (ref 0.0–0.5)
Eosinophils Relative: 2 %
HCT: 36.2 % (ref 36.0–46.0)
Hemoglobin: 11.4 g/dL — ABNORMAL LOW (ref 12.0–15.0)
Immature Granulocytes: 1 %
Lymphocytes Relative: 22 %
Lymphs Abs: 2.2 10*3/uL (ref 0.7–4.0)
MCH: 29.1 pg (ref 26.0–34.0)
MCHC: 31.5 g/dL (ref 30.0–36.0)
MCV: 92.3 fL (ref 80.0–100.0)
Monocytes Absolute: 0.8 10*3/uL (ref 0.1–1.0)
Monocytes Relative: 8 %
Neutro Abs: 6.5 10*3/uL (ref 1.7–7.7)
Neutrophils Relative %: 66 %
Platelets: 213 10*3/uL (ref 150–400)
RBC: 3.92 MIL/uL (ref 3.87–5.11)
RDW: 15.4 % (ref 11.5–15.5)
WBC: 9.8 10*3/uL (ref 4.0–10.5)
nRBC: 0 % (ref 0.0–0.2)

## 2021-04-10 LAB — GLUCOSE, CAPILLARY
Glucose-Capillary: 100 mg/dL — ABNORMAL HIGH (ref 70–99)
Glucose-Capillary: 103 mg/dL — ABNORMAL HIGH (ref 70–99)
Glucose-Capillary: 108 mg/dL — ABNORMAL HIGH (ref 70–99)
Glucose-Capillary: 140 mg/dL — ABNORMAL HIGH (ref 70–99)
Glucose-Capillary: 80 mg/dL (ref 70–99)
Glucose-Capillary: 92 mg/dL (ref 70–99)

## 2021-04-10 LAB — FACTOR 5 LEIDEN

## 2021-04-10 LAB — PROTHROMBIN GENE MUTATION

## 2021-04-10 LAB — PROTIME-INR
INR: 1.2 (ref 0.8–1.2)
Prothrombin Time: 14.9 seconds (ref 11.4–15.2)

## 2021-04-10 MED ORDER — ENSURE ENLIVE PO LIQD
237.0000 mL | Freq: Three times a day (TID) | ORAL | Status: DC
  Administered 2021-04-10 – 2021-04-13 (×4): 237 mL via ORAL
  Filled 2021-04-10: qty 237

## 2021-04-10 MED ORDER — WARFARIN SODIUM 5 MG PO TABS
10.0000 mg | ORAL_TABLET | Freq: Once | ORAL | Status: AC
  Administered 2021-04-10: 10 mg
  Filled 2021-04-10: qty 2

## 2021-04-10 MED ORDER — PROSOURCE TF PO LIQD
45.0000 mL | Freq: Three times a day (TID) | ORAL | Status: DC
  Administered 2021-04-10 – 2021-04-13 (×9): 45 mL
  Filled 2021-04-10 (×9): qty 45

## 2021-04-10 MED ORDER — ASPIRIN EC 325 MG PO TBEC
325.0000 mg | DELAYED_RELEASE_TABLET | Freq: Every day | ORAL | Status: DC
  Administered 2021-04-11: 325 mg via ORAL
  Filled 2021-04-10 (×2): qty 1

## 2021-04-10 MED ORDER — OSMOLITE 1.5 CAL PO LIQD
900.0000 mL | ORAL | Status: DC
  Administered 2021-04-10 – 2021-04-12 (×3): 900 mL
  Filled 2021-04-10: qty 948
  Filled 2021-04-10: qty 1000
  Filled 2021-04-10 (×2): qty 948

## 2021-04-10 MED ORDER — FREE WATER
150.0000 mL | Freq: Four times a day (QID) | Status: DC
  Administered 2021-04-10 – 2021-04-13 (×12): 150 mL

## 2021-04-10 NOTE — Progress Notes (Addendum)
STROKE TEAM PROGRESS NOTE   INTERVAL HISTORY Patient appears stable lying in bed with right sided preference. Nonverbal today. Diet has been advanced to Dys 1 and patient appears to be doing ok.    Vitals:   04/10/21 0400 04/10/21 0446 04/10/21 0801 04/10/21 1013  BP: 120/71  106/88 118/77  Pulse: (!) 102  89 90  Resp: 19  18   Temp: 98.4 F (36.9 C)  98.2 F (36.8 C)   TempSrc: Axillary  Oral   SpO2: 100%  100%   Weight:  112.8 kg    Height:       CBC:  Recent Labs  Lab 04/08/21 0228 04/10/21 0837  WBC 11.7* 9.8  NEUTROABS  --  6.5  HGB 11.3* 11.4*  HCT 35.4* 36.2  MCV 91.2 92.3  PLT 187 213   Basic Metabolic Panel:  Recent Labs  Lab 04/04/21 1629 04/05/21 0711 04/06/21 0846 04/07/21 0344 04/08/21 0228  NA  --  137   < > 136 138  K  --  4.2   < > 4.2 4.0  CL  --  110   < > 107 110  CO2  --  20*   < > 19* 19*  GLUCOSE  --  107*   < > 116* 111*  BUN  --  14   < > 14 17  CREATININE  --  0.64   < > 0.61 0.56  CALCIUM  --  8.4*   < > 9.0 8.8*  MG 1.8 1.7  --   --   --   PHOS 2.3* 2.8  --   --   --    < > = values in this interval not displayed.   IMAGING past 24 hours No results found.  PHYSICAL EXAM Constitutional: Obese.Young Caucasian lady not in distress. Eyes: No scleral injection HENT: No oropharyngeal obstruction.  MSK: no joint deformities.  Cardiovascular:Tachycardiacrate and regular rhythm.  Respiratory: Effort normal, non-labored breathing Skin: Warm dry and intact visible skin  Neuro: Mental Status: Patient isalertreacts to noxious stimuliin all extremities. Will not open her eyes, but moves to scratch her noise and has movements of the RLE that appear purposeful. Patienthasright sided gaze preference.Unable to look to the left. Blinks to threat on the right but not on the left. Does not respond to commands.Left-sided neglect Cranial Nerves: II: Pupils are 66mm equal, round, and reactive to light. Right sided gaze preference  preference.Does not spontaneously open eyes for exam today but is awake. III,IV, VI: EOMI with right gaze preferencedoes not pass midline V: No eyelash response on Left. AST:[id number] droop.  VIII:Does not follow commands, so unable to assess this. X, XI, XII: Does not follow commands.   Motor: Toneis decreased in LUE but bulk areappropriate in all extremities.Localizes pain on LUE, but but it remains externally rotated and flaccid.LLEmaintains horizontal plane movementat the hip with noxious stimuli, in a splint today. RUE good spontaneous movement against gravity, 3/5.Good spontaneous movement of the right lower extremity can push against the bed and move her entire body indicating good flexion and strength. Sensory: Reacts equally to noxious stimulation in all 4 extremities.Can localize pain in all 4 extremities. Recoils every extremity in response to painin LUE it is at the shoulder. Recoils in BLE at the knee and hip. Cerebellar: Does not do RUE on command but did do well spontaneously when successfully scratching her nose.   Of note, Patient starts pushing her right foot against the bed frame and shaking  her entire body every time writer touch her or ask her to do something. Then stops when the writer takes a moment and does not give a command.   ASSESSMENT/PLAN Ms.Shaquayla L Matthewsis a 35 y.o.femalewith history of multiple strokes who presented as a code stroke with symptoms suggestive of LVO. tPA was not given due to Eliquis. CTA head and neck revealed an occlusion of distal right M2 MCA with partial reconstitution and subsequent reocclusion of most M2 branches. Code IR was called and patient was taken emergently to IR suite. Patient underwent thrombectomy. Patient remains extubated but is not able to follow commands. Core track placed today. Will get repeat CT this AM and if stable will start ASA. Also recommend that patient OP PCP or OBGYN discontinue  patient Depo-Provera injections. Patient has had multiple strokes, smokes, has a hx of Pseudotumor cerebri, and has hx of migraines. This medication is contraindicated as it can increase risk for stroke especially in this setting. Patient has been receiving this medication since at least 2019, but patient first stroke was reported in 2018. Patient neurological exam remains stable. Patient again exhibited some behavior that appeared to demonstrate that patient may be actively not participating to her full capabilities on physical exam.  Patient appears to be irritated with the exam and stops certain movements when not being examined or opens her eyes when writer walks towards the door. Fortunately patient is doing well in SLP and is doing well with her diet. Would like to increase caloric intake via PO to help patient get into SNF early next week.   Recurrent cryptogenic embolic strokes -Acute infarct of the R MCA due to right M1 occlusion s/p IR with TICI2c,cryptogenic etiology  Code Stroke:CT head No acuteintracranial hemorrhage.Acute infarcts in the right MCAterritory.ASPECTS8.   CTA head & neckOcclusion of distal right M1 MCA with partial reconstitution and subsequent reocclusion of most M2 branches. Poor collaterals.  CTPerfusion imaging demonstrates core infarction 99 mL and penumbra of 34 mL. The extent of core infarction is calculated at greater than apparent loss of gray-white differentiation on noncontrast head CT. Therefore, penumbra may be greater.  Concern for behavior hindering neurological exam advancement  2D EchoLVEF 60-65%, no structural abnormalities noted that could increase risk for CVA  Repeat CT Head-large left MCA infarct, no midline shift. Small SAH sylvian fissure, diminishing in size,4/24  Hypercoagulable panel: Homocysteine serum slightly elevated at 15.1, otherwise negative to date  LDL48  HgbA1c5.7  VTE prophylaxis -SCDs+ Lovenox  aspirin  325 mg daily and Eliquis (apixaban) dailyprior to admission, began Warfarin 4/27  Consult to Pharm for warfarin initiation goal INR 2.5-3.5  PT-INR scheduled for 4/30  Therapy recommendations:PT/OT/SLP  Disposition:Pending  Leukocytosis- resolved Afebrile - WBC 11.7>9.8 - UA negative  Tachycardia- improving  Labtealol 100mg  BID  Hyperlipidemia  Home meds:atrovastatin 80mg  and zetia 10mg    Will restart post CT if stable  LDL 48, goal < 70  Resume after cortrak placement  Continue statin at discharge  Dysphagia- DYS diet 1  Cortrack placementfor TF  SLP following  Restrict tube feed to 7pm-7am  Calorie count through the weekend   Birth control  Recommend OP PCP or OBGYN discontinue Depo - provera due to numerous Contraindications including hx of tobacco use, migraines, and multiple strokes.  Tobacco abuse  Current smoker  Smoking cessation counselingwill beprovided  Contraindicated for birth control Depo  Hx of pseudotumor cerebri  Follows with Dr. at [facility]  On topamax 150mg  bid - will continue after  cortrak placed  Other Stroke Risk Factors  MorbidObesity,Body mass index is 42.12 kg/m., BMI >/= 30 associated with increased stroke risk, recommend weight loss, diet and exercise as appropriate   Hx strokeandTIA  Migraines ? consult to Pharm for Effexor per tube ? Continue home topamax  Obstructive sleep apnea   Hospital day # 8  Eliseo Gum, MD PGY-1 I have personally obtained history,examined this patient, reviewed notes, independently viewed imaging studies, participated in medical decision making and plan of care.ROS completed by me personally and pertinent positives fully documented  I have made any additions or clarifications directly to the above note. Agree with note above.  Recommend patient increase to a total of 4 during the day and discontinue daytime tube feeds and continue tube feeds only at night In  order to encourage Oral intake during the day.  Awaiting transfer to rehab when bed available.  Greater than 50% time during this 25-minute visit was spent on counseling   and coordination of care about stroke and dysphagia  Delia Heady, MD Medical Director [facility] Pager: [phone] 04/10/2021 2:08 PM  To contact Stroke Continuity provider, please refer to [email or web address]. After hours, contact General Neurology

## 2021-04-10 NOTE — Progress Notes (Signed)
Nutrition Follow-up  DOCUMENTATION CODES:   Obesity unspecified  INTERVENTION:  -Calorie Count per MD; RD will follow--up with results on Monday, May 2 -Ensure Enlive po TID, each supplement provides 350 kcal and 20 grams of protein -Magic cup TID with meals, each supplement provides 290 kcal and 9 grams of protein   Transition TF to nocturnal regimen as follows (to be administered via Cortrak): -Provide Osmolite 1.5 @ 69ml/hr for 12 hours ( ) from 1800-0600 -69ml Prosource TF TID - free water Q6H  Nocturnal TF regimen will provide 1470 kcals, 89g protein, free water (Meets 77% and 81% of minimum estimated calorie and protein needs, respectively)  NUTRITION DIAGNOSIS:   Inadequate oral intake related to inability to eat as evidenced by NPO status. 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   4/22 cortrak placed (gastric tip)  Pt's diet was advanced to dysphagia 1 with thin liquids on 4/27, but only 1 meal has been documented as 10% completion. RD received consult to initiate calorie count. Discussed pt with RN who was told by MD to transition pt to nocturnal TF today (previously tolerating Osmolite 1.5 @ 51ml/hr via Cortrak with 31ml Prosource TF BID). RD will provide nocturnal TF orders. RD will also order supplements for pt to optimize po intake during calorie count. Calorie count envelope placed on door, orders placed, and instructions  reviewed with RN. Note, RN reports pt has not been alert and has not shown interest in taking anything po -- suspect pt will not do well with calorie count, so will be sure to meet majority of pt's needs overnight while still allowing for optimal po intake during the daytime hours. Recommend strict cut off time of 0600 for TF each morning. RN expressed understanding.   UOP: x24 hours  Generalized mild pitting edema noted per RN assessment   Admit wt: 114.8 kg Current wt: 112.8 kg  Medications: protonix, senokot-s, coumadin IVF: NS @ 36ml/hr Labs reviewed.  CBGs 108-100-103  Diet Order:   Diet Order            DIET - DYS 1 Room service appropriate? No; Fluid consistency: Thin  Diet effective now                 EDUCATION NEEDS:   No education needs have been identified at this time  Skin:  Skin Assessment: Reviewed RN Assessment  Last BM:  4/28  Height:   Ht Readings from Last 1 Encounters:  04/02/21 5\' 5"  (1.651 m)    Weight:   Wt Readings from Last 1 Encounters:  04/10/21 112.8 kg    Ideal Body Weight:  56.8 kg  BMI:  Body mass index is 41.38 kg/m.  Estimated Nutritional Needs:   Kcal:  1900-2100  Protein:  110-125 grams  Fluid:  > 2 L/day    04/12/21, MS, RD, LDN RD pager number and weekend/on-call pager number located in Amion.

## 2021-04-10 NOTE — Progress Notes (Signed)
ANTICOAGULATION CONSULT NOTE - Initial Consult  Pharmacy Consult for  Warfarin  Indication:  Acute stroke  Allergies  Allergen Reactions  . Tape Rash    Cannot tolerate paper tape    Patient Measurements: Height: 5\' 5"  (165.1 cm) Weight: 112.8 kg (248 lb 10.9 oz) IBW/kg (Calculated) : 57   Vital Signs: Temp: 98.4 F (36.9 C) (04/29 0400) Temp Source: Axillary (04/29 0400) BP: 120/71 (04/29 0400) Pulse Rate: 102 (04/29 0400)  Labs: Recent Labs    04/08/21 0228 04/09/21 0405 04/10/21 0122  HGB 11.3*  --   --   HCT 35.4*  --   --   PLT 187  --   --   LABPROT  --  13.8 14.9  INR  --  1.1 1.2  CREATININE 0.56  --   --     Estimated Creatinine Clearance: 124 mL/min (by C-G formula based on SCr of 0.56 mg/dL).   Medical History: Past Medical History:  Diagnosis Date  . DDD (degenerative disc disease), lumbar   . Headache   . Other disorders of optic nerve, not elsewhere classified, unspecified eye   . Stroke (HCC)   . TTP (thrombotic thrombocytopenic purpura)     Medications:  Medications Prior to Admission  Medication Sig Dispense Refill Last Dose  . apixaban (ELIQUIS) 5 MG TABS tablet Take 5 mg by mouth 2 (two) times daily.   [facility] at [facility]  . aspirin EC 325 MG tablet Take 1 tablet (325 mg total) by mouth daily. (Patient not taking: Reported on 12/19/2020) 30 tablet 0   . atenolol (TENORMIN) 50 MG tablet Take 50 mg by mouth daily.     02/16/2021 atorvastatin (LIPITOR) 40 MG tablet Take 1 tablet (40 mg total) by mouth daily. (Patient not taking: No sig reported) 90 tablet 3 Not Taking at Unknown time  . atorvastatin (LIPITOR) 80 MG tablet Take 80 mg by mouth daily.     Marland Kitchen co-enzyme Q-10 50 MG capsule Take 50 mg by mouth 2 (two) times daily. (Patient not taking: Reported on 12/19/2020)     . Erenumab-aooe (AIMOVIG) 140 MG/ML SOAJ Inject 140 mg into the skin every 30 (thirty) days. (Patient not taking: Reported on 12/19/2020) 3 mL 0   . ezetimibe (ZETIA) 10 MG tablet Take 10 mg by  mouth daily.     02/16/2021 gabapentin (NEURONTIN) 300 MG capsule TAKE 1 CAPSULE BY MOUTH TWICE DAILY, MAY INCREASE TO 2 TWICE DAILY AS NEEDED 120 capsule 11   . Magnesium Oxide 400 (240 Mg) MG TABS Take 400 mg by mouth 2 (two) times daily. 60 tablet 11   . meclizine (ANTIVERT) 12.5 MG tablet Take 12.5 mg by mouth 3 (three) times daily as needed for dizziness. (Patient not taking: Reported on 12/19/2020)     . medroxyPROGESTERone (DEPO-PROVERA) 150 MG/ML injection Inject 150 mg into the muscle every 3 (three) months.     . meloxicam (MOBIC) 15 MG tablet TAKE ONE TABLET DAILY AS NEEDED WITH FOOD (Patient not taking: Reported on 12/19/2020) 90 tablet 0   . nortriptyline (PAMELOR) 50 MG capsule She is going to discuss further refills with her neurologist at [facility]. Her next appt is in June. (Patient not taking: Reported on 12/19/2020) 90 capsule 0   . NURTEC 75 MG TBDP Take 75 mg by mouth at bedtime.     . promethazine (PHENERGAN) 25 MG tablet Take 25 mg by mouth every 6 (six) hours as needed for nausea or vomiting.     02/16/2021  Riboflavin 100 MG TABS Take 1 tablet (100 mg total) by mouth 2 (two) times daily. 60 tablet 11   . topiramate (TOPAMAX) 100 MG tablet TAKE 1 & 1/2 TABLET (150 MG TOTAL) BY MOUTH 2 TIMES DAILY. 270 tablet 3   . venlafaxine XR (EFFEXOR-XR) 150 MG 24 hr capsule Take 150 mg by mouth daily with breakfast.       Assessment:   35 y.o female on  Apixaban PTA for hx prior strokes, ?DVT.  Last dose PTA " unknown", admitted 04/02/21 with acute CVA.  No TPA due to Apixaban pta.   -4/21 s/p thrombectomy. -Neurologist has consulted pharmacist on 04/08/21 to start  Warfarin  goal INR 2.5-3.5 per Dr. Morrie Sheldon.  Baseline INR 1.2 on admit 4/21 LD pta "unkown". Hgb stable in 11s>10s (13.3 on admit), pltc wnl stable .  No acute bleeding noted.   Hypercoagulable panel:  Homocysteine serum slightly elevated at 15.1, otherwise negative to date.   For warfarin naive patient, her Warfarin point score  =8 pts , using  her age, weight, and indication.  I will start warfarin  dose lower than the recommended dose per nomogram due to  acute CVA Monitor for bleeding.    Dr. Pearlean Brownie recommends OP PCP or OBGYN to d/c Depo-provera due to numerous contraindications including h/o multiple strokes, tobacco use and migraines.    INR today 1.1>>1.2   Goal of Therapy:  INR 2.5-3.5 per Neurologist  Monitor platelets by anticoagulation protocol: Yes   Plan:  Give Warfarin 10 mg per tube today x1 Daily INR  Monitor for bleeding Warfarin education book/info sheet requested.  Will educate patient /or family caregiver. Lovenox 40mg  sq daily for VTE prophylaxis continues until INR =or >2.5 then will need to stop. Currently remains on ASA 162 mg per tube daily.  Follow up plan once INR therapeutic.    Tiona Ruane A. , PharmD, BCPS, FNKF Clinical Pharmacist  Please utilize Amion for appropriate phone number to reach the unit pharmacist [facility])

## 2021-04-10 NOTE — Progress Notes (Signed)
Occupational Therapy Treatment Patient Details Name: NAMIYAH GRANTHAM MRN: [id number] DOB: [birth year] Today's Date: 04/10/2021    History of present illness The pt is a 35 yo female presenting with L sided facial droop and L sided weakness on 4/21. Underwent angiogram and revascularization on 4/21. 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   OT comments  Pt. Seen for OT to address ADLs, cognition, and neuromuscular re-education. Pt. Was resistive to ROM of R UE. Pt. Had ROM of L ue and encouraged pt. To assist. Pt. Was encouraged to look to l but had head turned to r. Pt. Did not assist with ADLs in supine. Pt. Would not assist with self feeding and would not open mouth to be fed. Pt. Aide states she would not eat with her this am. Acute ot to follow.   Follow Up Recommendations  SNF;Supervision/Assistance - 24 hour    Equipment Recommendations       Recommendations for Other Services      Precautions / Restrictions Precautions Precautions: Fall Precaution Comments: R gaze, L hemi       Mobility Bed Mobility                    Transfers                      Balance                                           ADL either performed or assessed with clinical judgement   ADL Overall ADL's : Needs assistance/impaired Eating/Feeding: Total assistance;Bed level (Pt. refused all attempts at food or beverage.)   Grooming: Wash/dry hands;Wash/dry face;Bed level;Total assistance           Upper Body Dressing : Total assistance;Bed level                     General ADL Comments: Pt. is total assist for all adls and does not attempt to assist.     Vision       Perception     Praxis      Cognition Arousal/Alertness: Awake/alert Behavior During Therapy: Flat affect Overall Cognitive Status: Impaired/Different from baseline Area of Impairment:  (Pt. is not following commands or  verbalizing.)                                        Exercises General Exercises - Upper Extremity Shoulder Flexion: Left;20 reps;Supine;PROM (Pt. was resistance to all rom on r side) Shoulder Extension: Left;20 reps;Supine;PROM Shoulder ABduction: Left;20 reps;Supine;PROM Shoulder ADduction: Left;20 reps;Supine;PROM Elbow Flexion: PROM;Left;20 reps;Supine Elbow Extension: PROM;Left;20 reps;Supine Wrist Flexion: PROM;20 reps;Supine Wrist Extension: PROM;Left;20 reps;Supine Digit Composite Flexion: PROM;Left;20 reps;Supine Composite Extension: PROM;Left;15 reps;Supine   Shoulder Instructions       General Comments      Pertinent Vitals/ Pain       Pain Assessment: Faces Pain Score: 0-No pain  Home Living                                          Prior Functioning/Environment  Frequency  Min 2X/week        Progress Toward Goals  OT Goals(current goals can now be found in the care plan section)  Progress towards OT goals: Not progressing toward goals - comment  Acute Rehab OT Goals Patient Stated Goal: unable OT Goal Formulation: Patient unable to participate in goal setting Time For Goal Achievement: 04/17/21 Potential to Achieve Goals: Fair ADL Goals Pt/caregiver will Perform Home Exercise Program: Increased ROM;Increased strength;Both right and left upper extremity;With written HEP provided Additional ADL Goal #1: Pt will follow 1 step commands 10% of time Additional ADL Goal #2: Pt will be able to turn head and maintain head a midline or to left of midline for 30 seconds Additional ADL Goal #3: Pt will tolerate sitting EOB with total A for 10 minutes Additional ADL Goal #4: Continue to assess pt for splinting needs for LUE  Plan Discharge plan remains appropriate;Frequency remains appropriate    Co-evaluation                 AM-PAC OT "6 Clicks" Daily Activity     Outcome Measure   Help from another  person eating meals?: Total Help from another person taking care of personal grooming?: Total Help from another person toileting, which includes using toliet, bedpan, or urinal?: Total Help from another person bathing (including washing, rinsing, drying)?: Total Help from another person to put on and taking off regular upper body clothing?: Total Help from another person to put on and taking off regular lower body clothing?: Total 6 Click Score: 6    End of Session    OT Visit Diagnosis: Other abnormalities of gait and mobility (R26.89);Muscle weakness (generalized) (M62.81);Low vision, both eyes (H54.2);Other symptoms and signs involving cognitive function;Cognitive communication deficit (R41.841);Hemiplegia and hemiparesis Symptoms and signs involving cognitive functions: Cerebral infarction Hemiplegia - Right/Left: Left Hemiplegia - caused by: Cerebral infarction   Activity Tolerance  (Pt. was resistive to rom and would not assit with any adls)   Patient Left in bed;with call bell/phone within reach;with bed alarm set   Nurse Communication  (ok therapy)        Time: 5361-4431 OT Time Calculation (min): 28 min  Charges: OT General Charges $OT Visit: 1 Visit OT Treatments $Self Care/Home Management : 8-22 mins $Neuromuscular Re-education: 8-22 mins  Derrek Gu OT/L    Graceyn Fodor 04/10/2021, 1:59 PM

## 2021-04-11 DIAGNOSIS — Z8673 Personal history of transient ischemic attack (TIA), and cerebral infarction without residual deficits: Secondary | ICD-10-CM | POA: Diagnosis not present

## 2021-04-11 DIAGNOSIS — I63511 Cerebral infarction due to unspecified occlusion or stenosis of right middle cerebral artery: Secondary | ICD-10-CM | POA: Diagnosis not present

## 2021-04-11 DIAGNOSIS — I6601 Occlusion and stenosis of right middle cerebral artery: Secondary | ICD-10-CM | POA: Diagnosis not present

## 2021-04-11 LAB — CBC WITH DIFFERENTIAL/PLATELET
Abs Immature Granulocytes: 0.05 10*3/uL (ref 0.00–0.07)
Basophils Absolute: 0 10*3/uL (ref 0.0–0.1)
Basophils Relative: 0 %
Eosinophils Absolute: 0.2 10*3/uL (ref 0.0–0.5)
Eosinophils Relative: 2 %
HCT: 34.7 % — ABNORMAL LOW (ref 36.0–46.0)
Hemoglobin: 11.1 g/dL — ABNORMAL LOW (ref 12.0–15.0)
Immature Granulocytes: 1 %
Lymphocytes Relative: 23 %
Lymphs Abs: 2.3 10*3/uL (ref 0.7–4.0)
MCH: 28.9 pg (ref 26.0–34.0)
MCHC: 32 g/dL (ref 30.0–36.0)
MCV: 90.4 fL (ref 80.0–100.0)
Monocytes Absolute: 0.7 10*3/uL (ref 0.1–1.0)
Monocytes Relative: 7 %
Neutro Abs: 6.7 10*3/uL (ref 1.7–7.7)
Neutrophils Relative %: 67 %
Platelets: 233 10*3/uL (ref 150–400)
RBC: 3.84 MIL/uL — ABNORMAL LOW (ref 3.87–5.11)
RDW: 14.9 % (ref 11.5–15.5)
WBC: 10 10*3/uL (ref 4.0–10.5)
nRBC: 0 % (ref 0.0–0.2)

## 2021-04-11 LAB — GLUCOSE, CAPILLARY
Glucose-Capillary: 117 mg/dL — ABNORMAL HIGH (ref 70–99)
Glucose-Capillary: 130 mg/dL — ABNORMAL HIGH (ref 70–99)
Glucose-Capillary: 83 mg/dL (ref 70–99)
Glucose-Capillary: 96 mg/dL (ref 70–99)
Glucose-Capillary: 98 mg/dL (ref 70–99)
Glucose-Capillary: 99 mg/dL (ref 70–99)

## 2021-04-11 LAB — BASIC METABOLIC PANEL
Anion gap: 10 (ref 5–15)
BUN: 23 mg/dL — ABNORMAL HIGH (ref 6–20)
CO2: 18 mmol/L — ABNORMAL LOW (ref 22–32)
Calcium: 8.8 mg/dL — ABNORMAL LOW (ref 8.9–10.3)
Chloride: 112 mmol/L — ABNORMAL HIGH (ref 98–111)
Creatinine, Ser: 0.58 mg/dL (ref 0.44–1.00)
GFR, Estimated: >60 mL/min (ref >60)
Glucose, Bld: 99 mg/dL (ref 70–99)
Potassium: 3.8 mmol/L (ref 3.5–5.1)
Sodium: 140 mmol/L (ref 135–145)

## 2021-04-11 LAB — PROTIME-INR
INR: 1.5 — ABNORMAL HIGH (ref 0.8–1.2)
Prothrombin Time: 17.6 seconds — ABNORMAL HIGH (ref 11.4–15.2)

## 2021-04-11 MED ORDER — VALACYCLOVIR HCL 500 MG PO TABS
1000.0000 mg | ORAL_TABLET | Freq: Three times a day (TID) | ORAL | Status: DC
  Administered 2021-04-11: 1000 mg via ORAL
  Filled 2021-04-11 (×2): qty 2

## 2021-04-11 MED ORDER — WARFARIN SODIUM 5 MG PO TABS
10.0000 mg | ORAL_TABLET | Freq: Once | ORAL | Status: AC
  Administered 2021-04-11: 10 mg
  Filled 2021-04-11: qty 2

## 2021-04-11 NOTE — Progress Notes (Signed)
STROKE TEAM PROGRESS NOTE   INTERVAL HISTORY No family at the bedside. Pt lying in bed, calm, no agitation. Still has left hemianopia and left hemiplegia, nonverbal and not following commands. INR 1.5 today. On ASA and coumadin.    Vitals:   04/11/21 0500 04/11/21 0822 04/11/21 1018 04/11/21 1202  BP:  112/69 109/69 101/62  Pulse:  99 97 94  Resp:  18  15  Temp:  98.5 F (36.9 C)  98.3 F (36.8 C)  TempSrc:  Axillary  Axillary  SpO2:  100%  100%  Weight: 111.2 kg     Height:       CBC:  Recent Labs  Lab 04/10/21 0837 04/11/21 0241  WBC 9.8 10.0  NEUTROABS 6.5 6.7  HGB 11.4* 11.1*  HCT 36.2 34.7*  MCV 92.3 90.4  PLT 213 233   Basic Metabolic Panel:  Recent Labs  Lab 04/04/21 1629 04/05/21 0711 04/06/21 0846 04/07/21 0344 04/08/21 0228  NA  --  137   < > 136 138  K  --  4.2   < > 4.2 4.0  CL  --  110   < > 107 110  CO2  --  20*   < > 19* 19*  GLUCOSE  --  107*   < > 116* 111*  BUN  --  14   < > 14 17  CREATININE  --  0.64   < > 0.61 0.56  CALCIUM  --  8.4*   < > 9.0 8.8*  MG 1.8 1.7  --   --   --   PHOS 2.3* 2.8  --   --   --    < > = values in this interval not displayed.   IMAGING past 24 hours No results found.  PHYSICAL EXAM  Temp:  [98.3 F (36.8 C)-98.8 F (37.1 C)] 98.6 F (37 C) (04/30 1529) Pulse Rate:  [87-107] 91 (04/30 1529) Resp:  [15-19] 16 (04/30 1529) BP: (101-134)/(62-84) 113/76 (04/30 1529) SpO2:  [99 %-100 %] 100 % (04/30 1529) Weight:  [111.2 kg] 111.2 kg (04/30 0500)  General - Well nourished, well developed, in no apparent distress.  Ophthalmologic - fundi not visualized due to noncooperation.  Cardiovascular - Regular rhythm and rate.  Neuro - awake alert eyes open, nonverbal, not following commands, not able to name and repeat.  Right gaze preference, able to track to cross midline but left gaze incompleate.  Blinking to visual threat on the right but not on the left.  Left facial droop.  Left upper extremity flaccid, left  LE knee flexion with pain at left foot.  Right upper extremity spontaneous movement against gravity.  Right lower extremity at least 3/5 spontaneously. Sensation, coordination and gait not tested..   ASSESSMENT/PLAN [name] a 35 y.o.femalewith history of multiple strokes who presented as a code stroke with symptoms suggestive of LVO. tPA was not given due to Eliquis. CTA head and neck revealed an occlusion of distal right M2 MCA with partial reconstitution and subsequent reocclusion of most M2 branches. Patient underwent thrombectomy.   Recurrent cryptogenic embolic strokes -Acute infarct of the R MCA due to right M1 occlusion s/p IR with TICI2c,cryptogenic etiology  Code Stroke:CT head No acuteintracranial hemorrhage.Acute infarcts in the right MCAterritory.ASPECTS8.   CTA head & neckOcclusion of distal right M1 MCA with partial reconstitution and subsequent reocclusion of most M2 branches. Poor collaterals.  CTPerfusion imaging demonstrates core infarction 99 mL and penumbra of 34 mL. The extent  of core infarction is calculated at greater than apparent loss of gray-white differentiation on noncontrast head CT. Therefore, penumbra may be greater.  2D EchoLVEF 60-65%, no structural abnormalities noted that could increase risk for CVA  Repeat CT Head 4/24-large left MCA infarct, no midline shift. Small SAH sylvian fissure, diminishing in size  Hypercoagulable panel: Homocysteine 15.1 due to smoking, otherwise negative to date  LDL48  HgbA1c5.7  VTE prophylaxis -SCDs+ Lovenox  aspirin 325 mg daily and Eliquis (apixaban) dailyprior to admission, now on ASA 325 with warfarin started on 4/27. Goal INR 2.5-3.5  PT-INR 1.0->1.2->1.5  Therapy recommendations:SNF  Disposition:Pending  Hx of cryptogenic strokes  In 08/2017 patient admitted for left sided numbness and headache.  MRI showed right cerebellum, right temporoparietal small infarcts.  EF  55 to 60%, CT head and neck unremarkable.  LDL 89, A1c 4.8.  TCD bubble study showed trivial PFO.  DVT negative.  TEE no PFO.  Hypercoagulable and autoimmune work-up negative.  Discharged with aspirin and Lipitor.  01/2018 episode of left facial numbness and left leg weakness.  CT head and neck unremarkable.  MRI negative.  06/2018 admitted to [facility] for right hand/face numbness.  MRI showed right tiny parietal infarct.  10/2020 admitted to [facility] for aphasia status post tPA.  CTA neck showed left M2 occlusion.  MRI showed left MCA and punctate right frontal infarct.  TCD bubble study potential trivial PFO, TEE tiny PFO.  11/2020 she was referred to loop recorder which was not done as physician thought she should be candidate for anticoagulation.  Started on Eliquis 5 mg twice daily.  Leukocytosis- resolved  Afebrile  WBC 11.7>9.8->10.0  UA negative  Tachycardia, resolved  Labtealol 100mg  BID  HR 90s  Hyperlipidemia  Home meds:atrovastatin 80mg  and zetia 10mg    Will restart post CT if stable  LDL 48, goal < 70  Resumed, on lipitor 80 and zetia 10  Continue statin at discharge  Dysphagia- DYS diet 1  Cortrack placementfor TF  SLP following  Nocturnal tube feed to 7pm-7am  Calorie count through the weekend  Birth control  Recommend OP PCP or OBGYN discontinue Depo - provera due to numerous Contraindications including hx of tobacco use, migraines, and multiple strokes.  Tobacco abuse  Current smoker  Smoking cessation counselingwill beprovided  Contraindicated for birth control Depo  Hx of pseudotumor cerebri Chronic migraine  Follows with Dr. 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  Other Stroke Risk Factors  MorbidObesity,Body mass index is 42.12 kg/m., BMI >/= 30 associated with increased stroke risk, recommend weight loss, diet and exercise as  appropriate   Obstructive sleep apnea   Hospital day # 9  Terrace Arabia, MD PhD Stroke Neurology 04/11/2021 4:45 PM   To contact Stroke Continuity provider, please refer to . After hours, contact General Neurology

## 2021-04-11 NOTE — Progress Notes (Signed)
ANTICOAGULATION CONSULT NOTE - Initial Consult  Pharmacy Consult for  Warfarin  Indication:  Acute stroke  Allergies  Allergen Reactions  . Tape Rash    Cannot tolerate paper tape    Patient Measurements: Height: 5\' 5"  (165.1 cm) Weight: 111.2 kg (245 lb 2.4 oz) IBW/kg (Calculated) : 57   Vital Signs: Temp: 98.7 F (37.1 C) (04/30 0347) Temp Source: Axillary (04/30 0347) BP: 126/70 (04/30 0347) Pulse Rate: 100 (04/30 0347)  Labs: Recent Labs    04/09/21 0405 04/10/21 0122 04/10/21 0837 04/11/21 0241  HGB  --   --  11.4* 11.1*  HCT  --   --  36.2 34.7*  PLT  --   --  213 233  LABPROT 13.8 14.9  --  17.6*  INR 1.1 1.2  --  1.5*    Estimated Creatinine Clearance: 123.1 mL/min (by C-G formula based on SCr of 0.56 mg/dL).   Medical History: Past Medical History:  Diagnosis Date  . DDD (degenerative disc disease), lumbar   . Headache   . Other disorders of optic nerve, not elsewhere classified, unspecified eye   . Stroke (HCC)   . TTP (thrombotic thrombocytopenic purpura)     Medications:  Medications Prior to Admission  Medication Sig Dispense Refill Last Dose  . apixaban (ELIQUIS) 5 MG TABS tablet Take 5 mg by mouth 2 (two) times daily.   UNK at [facility]  . aspirin EC 325 MG tablet Take 1 tablet (325 mg total) by mouth daily. (Patient not taking: Reported on 12/19/2020) 30 tablet 0   . atenolol (TENORMIN) 50 MG tablet Take 50 mg by mouth daily.     02/16/2021 atorvastatin (LIPITOR) 40 MG tablet Take 1 tablet (40 mg total) by mouth daily. (Patient not taking: No sig reported) 90 tablet 3 Not Taking at Unknown time  . atorvastatin (LIPITOR) 80 MG tablet Take 80 mg by mouth daily.     Marland Kitchen co-enzyme Q-10 50 MG capsule Take 50 mg by mouth 2 (two) times daily. (Patient not taking: Reported on 12/19/2020)     . Erenumab-aooe (AIMOVIG) 140 MG/ML SOAJ Inject 140 mg into the skin every 30 (thirty) days. (Patient not taking: Reported on 12/19/2020) 3 mL 0   . ezetimibe (ZETIA) 10 MG tablet  Take 10 mg by mouth daily.     02/16/2021 gabapentin (NEURONTIN) 300 MG capsule TAKE 1 CAPSULE BY MOUTH TWICE DAILY, MAY INCREASE TO 2 TWICE DAILY AS NEEDED 120 capsule 11   . Magnesium Oxide 400 (240 Mg) MG TABS Take 400 mg by mouth 2 (two) times daily. 60 tablet 11   . meclizine (ANTIVERT) 12.5 MG tablet Take 12.5 mg by mouth 3 (three) times daily as needed for dizziness. (Patient not taking: Reported on 12/19/2020)     . medroxyPROGESTERone (DEPO-PROVERA) 150 MG/ML injection Inject 150 mg into the muscle every 3 (three) months.     . meloxicam (MOBIC) 15 MG tablet TAKE ONE TABLET DAILY AS NEEDED WITH FOOD (Patient not taking: Reported on 12/19/2020) 90 tablet 0   . nortriptyline (PAMELOR) 50 MG capsule She is going to discuss further refills with her neurologist at [facility] Her next appt is in June. (Patient not taking: Reported on 12/19/2020) 90 capsule 0   . NURTEC 75 MG TBDP Take 75 mg by mouth at bedtime.     . promethazine (PHENERGAN) 25 MG tablet Take 25 mg by mouth every 6 (six) hours as needed for nausea or vomiting.     . Riboflavin  100 MG TABS Take 1 tablet (100 mg total) by mouth 2 (two) times daily. 60 tablet 11   . topiramate (TOPAMAX) 100 MG tablet TAKE 1 & 1/2 TABLET (150 MG TOTAL) BY MOUTH 2 TIMES DAILY. 270 tablet 3   . venlafaxine XR (EFFEXOR-XR) 150 MG 24 hr capsule Take 150 mg by mouth daily with breakfast.       Assessment:   35 y.o female on  Apixaban PTA for hx prior strokes, ?DVT.  Last dose PTA " unknown", admitted 04/02/21 with acute CVA.  No TPA due to Apixaban pta.   -4/21 s/p thrombectomy. -Neurologist has consulted pharmacist on 04/08/21 to start  Warfarin  goal INR 2.5-3.5 per Dr. Morrie Sheldon.  Baseline INR 1.2 on admit 4/21 LD pta "unkown". Hgb stable in 11s>10s (13.3 on admit), pltc wnl stable .  No acute bleeding noted.   Hypercoagulable panel:  Homocysteine serum slightly elevated at 15.1, otherwise negative to date.   For warfarin naive patient, her Warfarin point score  =8  pts , using her age, weight, and indication.  I will start warfarin  dose lower than the recommended dose per nomogram due to  acute CVA Monitor for bleeding.    Dr. Pearlean Brownie recommends OP PCP or OBGYN to d/c Depo-provera due to numerous contraindications including h/o multiple strokes, tobacco use and migraines.    INR today 1.1>>1.2>>1.5   Goal of Therapy:  INR 2.5-3.5 per Neurologist  Monitor platelets by anticoagulation protocol: Yes   Plan:  Give Warfarin 10 mg per tube today x1 Daily INR  Monitor for bleeding Warfarin education book/info sheet requested.  Will educate patient /or family caregiver. Lovenox 40mg  sq daily for VTE prophylaxis continues until INR =or >2.5 then will need to stop. Currently remains on ASA 162 mg per tube daily.  Follow up plan once INR therapeutic.    Steen Bisig A. , PharmD, BCPS, FNKF Clinical Pharmacist [place] Please utilize Amion for appropriate phone number to reach the unit pharmacist [facility])

## 2021-04-11 NOTE — Plan of Care (Signed)
Called by RN that pt left flank may have shingles. I went to see pt and agree that left flank region multiple various sized blisters, with some has ruptured. Will do contact isolation, valtrex 1g tid for 7 days as well as wound care consult.   Marvel Plan, MD PhD Stroke Neurology 04/11/2021 7:42 PM

## 2021-04-11 NOTE — Consult Note (Signed)
WOC Nurse Consult Note: Reason for Consult: Consult received for topical care recommendations for area of varicella-zoster to left flank. Patient has been started on systemic antiviral (Valtrex). Blisters are both ruptured and serum-filled in small clusters. Wound type: infectious Pressure Injury POA: N/A Measurement: left flank with dermatomal erruption Wound CXF:QHKU, moist Drainage (amount, consistency, odor) serous Periwound: erythematous Dressing procedure/placement/frequency: I will provide Nursing with topical care guidance aimed at drying the lesions and covering to reduce pain using antimicrobial nonadherent (xeroform), dry gauze and a silicone foam bordered dressing.  WOC nursing team will not follow, but will remain available to this patient, the nursing and medical teams.  Please re-consult if needed. Thanks, Ladona Mow, MSN, RN, GNP, [place]  Pager# [phone]

## 2021-04-11 NOTE — Plan of Care (Signed)
  Problem: Education: Goal: Knowledge of disease or condition will improve Outcome: Not Progressing Goal: Knowledge of secondary prevention will improve Outcome: Not Progressing Goal: Knowledge of patient specific risk factors addressed and post discharge goals established will improve Outcome: Not Progressing Goal: Individualized Educational Video(s) Outcome: Not Progressing   Problem: Coping: Goal: Will verbalize positive feelings about self Outcome: Not Progressing Goal: Will identify appropriate support needs Outcome: Not Progressing   Problem: Health Behavior/Discharge Planning: Goal: Ability to manage health-related needs will improve Outcome: Not Progressing   Problem: Self-Care: Goal: Verbalization of feelings and concerns over difficulty with self-care will improve Outcome: Not Progressing Goal: Ability to communicate needs accurately will improve Outcome: Not Progressing   Problem: Nutrition: Goal: Risk of aspiration will decrease Outcome: Not Progressing Goal: Dietary intake will improve Outcome: Not Progressing   Problem: Ischemic Stroke/TIA Tissue Perfusion: Goal: Complications of ischemic stroke/TIA will be minimized Outcome: Not Progressing   Problem: Education: Goal: Knowledge of General Education information will improve Description: Including pain rating scale, medication(s)/side effects and non-pharmacologic comfort measures Outcome: Not Progressing   Problem: Health Behavior/Discharge Planning: Goal: Ability to manage health-related needs will improve Outcome: Not Progressing

## 2021-04-11 NOTE — Progress Notes (Signed)
Patient continues to refuse/resist food and fluids when offered for meals and in between meals, documented in chart, calorie count completed, TF ongoing overnight.

## 2021-04-11 NOTE — Progress Notes (Signed)
Patient found to have multiple weeping serous blisters noted on the left mid-back on one side of the body. Charge nurse called into room to assess further, MD paged.

## 2021-04-12 DIAGNOSIS — I6601 Occlusion and stenosis of right middle cerebral artery: Secondary | ICD-10-CM | POA: Diagnosis not present

## 2021-04-12 DIAGNOSIS — I63511 Cerebral infarction due to unspecified occlusion or stenosis of right middle cerebral artery: Secondary | ICD-10-CM | POA: Diagnosis not present

## 2021-04-12 LAB — CBC
HCT: 36.2 % (ref 36.0–46.0)
Hemoglobin: 11.5 g/dL — ABNORMAL LOW (ref 12.0–15.0)
MCH: 29 pg (ref 26.0–34.0)
MCHC: 31.8 g/dL (ref 30.0–36.0)
MCV: 91.4 fL (ref 80.0–100.0)
Platelets: 244 10*3/uL (ref 150–400)
RBC: 3.96 MIL/uL (ref 3.87–5.11)
RDW: 14.7 % (ref 11.5–15.5)
WBC: 9.4 10*3/uL (ref 4.0–10.5)
nRBC: 0 % (ref 0.0–0.2)

## 2021-04-12 LAB — GLUCOSE, CAPILLARY
Glucose-Capillary: 106 mg/dL — ABNORMAL HIGH (ref 70–99)
Glucose-Capillary: 70 mg/dL (ref 70–99)
Glucose-Capillary: 95 mg/dL (ref 70–99)
Glucose-Capillary: 70 mg/dL (ref 70–99)
Glucose-Capillary: 125 mg/dL — ABNORMAL HIGH (ref 70–99)

## 2021-04-12 LAB — BASIC METABOLIC PANEL
Anion gap: 9 (ref 5–15)
BUN: 23 mg/dL — ABNORMAL HIGH (ref 6–20)
CO2: 24 mmol/L (ref 22–32)
Calcium: 9 mg/dL (ref 8.9–10.3)
Chloride: 106 mmol/L (ref 98–111)
Creatinine, Ser: 0.68 mg/dL (ref 0.44–1.00)
GFR, Estimated: >60 mL/min (ref >60)
Glucose, Bld: 96 mg/dL (ref 70–99)
Potassium: 3.2 mmol/L — ABNORMAL LOW (ref 3.5–5.1)
Sodium: 139 mmol/L (ref 135–145)

## 2021-04-12 LAB — PROTIME-INR
INR: 2.2 — ABNORMAL HIGH (ref 0.8–1.2)
Prothrombin Time: 24.1 seconds — ABNORMAL HIGH (ref 11.4–15.2)

## 2021-04-12 MED ORDER — POTASSIUM CHLORIDE 20 MEQ PO PACK
40.0000 meq | PACK | ORAL | Status: DC
Start: 2021-04-12 — End: 2021-04-12
  Filled 2021-04-12: qty 2

## 2021-04-12 MED ORDER — POTASSIUM CHLORIDE 20 MEQ PO PACK
40.0000 meq | PACK | ORAL | Status: AC
  Administered 2021-04-12: 40 meq

## 2021-04-12 MED ORDER — ASPIRIN 325 MG PO TABS
325.0000 mg | ORAL_TABLET | Freq: Every day | ORAL | Status: DC
  Administered 2021-04-12 – 2021-04-20 (×9): 325 mg
  Filled 2021-04-12 (×9): qty 1

## 2021-04-12 MED ORDER — WARFARIN SODIUM 7.5 MG PO TABS
7.5000 mg | ORAL_TABLET | Freq: Once | ORAL | Status: AC
  Administered 2021-04-12: 7.5 mg
  Filled 2021-04-12: qty 1

## 2021-04-12 MED ORDER — VALACYCLOVIR HCL 500 MG PO TABS
1000.0000 mg | ORAL_TABLET | Freq: Three times a day (TID) | ORAL | Status: DC
  Administered 2021-04-12 – 2021-04-17 (×17): 1000 mg
  Filled 2021-04-12 (×17): qty 2

## 2021-04-12 NOTE — Plan of Care (Signed)
  Problem: Education: Goal: Knowledge of disease or condition will improve Outcome: Not Progressing Goal: Knowledge of secondary prevention will improve Outcome: Not Progressing Goal: Knowledge of patient specific risk factors addressed and post discharge goals established will improve Outcome: Not Progressing Goal: Individualized Educational Video(s) Outcome: Not Progressing   Problem: Coping: Goal: Will verbalize positive feelings about self Outcome: Not Progressing Goal: Will identify appropriate support needs Outcome: Not Progressing   Problem: Health Behavior/Discharge Planning: Goal: Ability to manage health-related needs will improve Outcome: Not Progressing   Problem: Self-Care: Goal: Verbalization of feelings and concerns over difficulty with self-care will improve Outcome: Not Progressing Goal: Ability to communicate needs accurately will improve Outcome: Not Progressing   Problem: Nutrition: Goal: Risk of aspiration will decrease Outcome: Not Progressing Goal: Dietary intake will improve Outcome: Not Progressing   Problem: Ischemic Stroke/TIA Tissue Perfusion: Goal: Complications of ischemic stroke/TIA will be minimized Outcome: Not Progressing   Problem: Education: Goal: Knowledge of General Education information will improve Description: Including pain rating scale, medication(s)/side effects and non-pharmacologic comfort measures Outcome: Not Progressing   Problem: Health Behavior/Discharge Planning: Goal: Ability to manage health-related needs will improve Outcome: Not Progressing   Problem: Clinical Measurements: Goal: Ability to maintain clinical measurements within normal limits will improve Outcome: Not Progressing Goal: Will remain free from infection Outcome: Not Progressing Goal: Diagnostic test results will improve Outcome: Not Progressing Goal: Respiratory complications will improve Outcome: Not Progressing Goal: Cardiovascular  complication will be avoided Outcome: Not Progressing   Problem: Activity: Goal: Risk for activity intolerance will decrease Outcome: Not Progressing   Problem: Nutrition: Goal: Adequate nutrition will be maintained Outcome: Not Progressing   Problem: Elimination: Goal: Will not experience complications related to bowel motility Outcome: Not Progressing Goal: Will not experience complications related to urinary retention Outcome: Not Progressing

## 2021-04-12 NOTE — Progress Notes (Signed)
ANTICOAGULATION CONSULT NOTE - Initial Consult  Pharmacy Consult for  Warfarin  Indication:  Acute stroke  Allergies  Allergen Reactions  . Tape Rash    Cannot tolerate paper tape    Patient Measurements: Height: 5\' 5"  (165.1 cm) Weight: 112 kg (246 lb 14.6 oz) IBW/kg (Calculated) : 57   Vital Signs: Temp: 98 F (36.7 C) (05/01 0356) Temp Source: Axillary (05/01 0356) BP: 120/91 (05/01 0356) Pulse Rate: 92 (05/01 0356)  Labs: Recent Labs    04/10/21 0122 04/10/21 0837 04/10/21 0837 04/11/21 0241 04/12/21 0243  HGB  --  11.4*   < > 11.1* 11.5*  HCT  --  36.2  --  34.7* 36.2  PLT  --  213  --  233 244  LABPROT 14.9  --   --  17.6* 24.1*  INR 1.2  --   --  1.5* 2.2*  CREATININE  --   --   --  0.58 0.68   < > = values in this interval not displayed.    Estimated Creatinine Clearance: 123.6 mL/min (by C-G formula based on SCr of 0.68 mg/dL).   Medical History: Past Medical History:  Diagnosis Date  . DDD (degenerative disc disease), lumbar   . Headache   . Other disorders of optic nerve, not elsewhere classified, unspecified eye   . Stroke (HCC)   . TTP (thrombotic thrombocytopenic purpura)     Medications:  Medications Prior to Admission  Medication Sig Dispense Refill Last Dose  . apixaban (ELIQUIS) 5 MG TABS tablet Take 5 mg by mouth 2 (two) times daily.   UNK at [facility]  . aspirin EC 325 MG tablet Take 1 tablet (325 mg total) by mouth daily. (Patient not taking: Reported on 12/19/2020) 30 tablet 0   . atenolol (TENORMIN) 50 MG tablet Take 50 mg by mouth daily.     02/16/2021 atorvastatin (LIPITOR) 40 MG tablet Take 1 tablet (40 mg total) by mouth daily. (Patient not taking: No sig reported) 90 tablet 3 Not Taking at Unknown time  . atorvastatin (LIPITOR) 80 MG tablet Take 80 mg by mouth daily.     Marland Kitchen co-enzyme Q-10 50 MG capsule Take 50 mg by mouth 2 (two) times daily. (Patient not taking: Reported on 12/19/2020)     . Erenumab-aooe (AIMOVIG) 140 MG/ML SOAJ Inject 140 mg  into the skin every 30 (thirty) days. (Patient not taking: Reported on 12/19/2020) 3 mL 0   . ezetimibe (ZETIA) 10 MG tablet Take 10 mg by mouth daily.     02/16/2021 gabapentin (NEURONTIN) 300 MG capsule TAKE 1 CAPSULE BY MOUTH TWICE DAILY, MAY INCREASE TO 2 TWICE DAILY AS NEEDED 120 capsule 11   . Magnesium Oxide 400 (240 Mg) MG TABS Take 400 mg by mouth 2 (two) times daily. 60 tablet 11   . meclizine (ANTIVERT) 12.5 MG tablet Take 12.5 mg by mouth 3 (three) times daily as needed for dizziness. (Patient not taking: Reported on 12/19/2020)     . medroxyPROGESTERone (DEPO-PROVERA) 150 MG/ML injection Inject 150 mg into the muscle every 3 (three) months.     . meloxicam (MOBIC) 15 MG tablet TAKE ONE TABLET DAILY AS NEEDED WITH FOOD (Patient not taking: Reported on 12/19/2020) 90 tablet 0   . nortriptyline (PAMELOR) 50 MG capsule She is going to discuss further refills with her neurologist at [facility]. Her next appt is in June. (Patient not taking: Reported on 12/19/2020) 90 capsule 0   . NURTEC 75 MG TBDP Take 75  mg by mouth at bedtime.     . promethazine (PHENERGAN) 25 MG tablet Take 25 mg by mouth every 6 (six) hours as needed for nausea or vomiting.     . Riboflavin 100 MG TABS Take 1 tablet (100 mg total) by mouth 2 (two) times daily. 60 tablet 11   . topiramate (TOPAMAX) 100 MG tablet TAKE 1 & 1/2 TABLET (150 MG TOTAL) BY MOUTH 2 TIMES DAILY. 270 tablet 3   . venlafaxine XR (EFFEXOR-XR) 150 MG 24 hr capsule Take 150 mg by mouth daily with breakfast.       Assessment:   35 y.o female on  Apixaban PTA for hx prior strokes, ?DVT.  Last dose PTA " unknown", admitted 04/02/21 with acute CVA.  No TPA due to Apixaban pta.   -4/21 s/p thrombectomy. -Neurologist has consulted pharmacist on 04/08/21 to start  Warfarin  goal INR 2.5-3.5 per Dr. Morrie Sheldon.  Baseline INR 1.2 on admit 4/21 LD pta "unkown". Hgb stable in 11s>10s (13.3 on admit), pltc wnl stable .  No acute bleeding noted.   Hypercoagulable panel:   Homocysteine serum slightly elevated at 15.1, otherwise negative to date.   For warfarin naive patient, her Warfarin point score  =8 pts , using her age, weight, and indication.  I will start warfarin  dose lower than the recommended dose per nomogram due to  acute CVA Monitor for bleeding.    Dr. Pearlean Brownie recommends OP PCP or OBGYN to d/c Depo-provera due to numerous contraindications including h/o multiple strokes, tobacco use and migraines.    INR today 1.1>>1.2>>1.5>2.2  Good response so far. Large jump from 1.5 to 2.2. Will reduce dose some as may continue large increases from 10mg  doses.    Goal of Therapy:  INR 2.5-3.5 per Neurologist  Monitor platelets by anticoagulation protocol: Yes   Plan:  Give Warfarin 7.5 mg per tube today x1 Daily INR  Monitor for bleeding Warfarin education book/info sheet requested.  Will educate patient /or family caregiver. Lovenox 40mg  sq daily for VTE prophylaxis continues until INR =or >2.5 then will need to stop. Currently remains on ASA 162 mg per tube daily.  Follow up plan once INR therapeutic.    Brianda Beitler A. , PharmD, BCPS, FNKF Clinical Pharmacist [place] Please utilize Amion for appropriate phone number to reach the unit pharmacist [facility])

## 2021-04-12 NOTE — Progress Notes (Signed)
STROKE TEAM PROGRESS NOTE   INTERVAL HISTORY No family at the bedside. Pt lying in bed, sleepy but arousable.  No neuro changes, still has left hemianopia and left hemiplegia, nonverbal and not following commands. INR 2.2 today.  Continue on ASA and coumadin.    Vitals:   04/12/21 0356 04/12/21 0500 04/12/21 0811 04/12/21 1249  BP: (!) 120/91  113/87 116/66  Pulse: 92   96  Resp: 18   20  Temp: 98 F (36.7 C)  98 F (36.7 C) 97.6 F (36.4 C)  TempSrc: Axillary   Axillary  SpO2: 100%   100%  Weight:  112 kg    Height:       CBC:  Recent Labs  Lab 04/10/21 0837 04/11/21 0241 04/12/21 0243  WBC 9.8 10.0 9.4  NEUTROABS 6.5 6.7  --   HGB 11.4* 11.1* 11.5*  HCT 36.2 34.7* 36.2  MCV 92.3 90.4 91.4  PLT 213 233 244   Basic Metabolic Panel:  Recent Labs  Lab 04/11/21 0241 04/12/21 0243  NA 140 139  K 3.8 3.2*  CL 112* 106  CO2 18* 24  GLUCOSE 99 96  BUN 23* 23*  CREATININE 0.58 0.68  CALCIUM 8.8* 9.0   IMAGING past 24 hours No results found.  PHYSICAL EXAM  Temp:  [97.6 F (36.4 C)-99.3 F (37.4 C)] 97.6 F (36.4 C) (05/01 1249) Pulse Rate:  [92-108] 96 (05/01 1249) Resp:  [17-20] 20 (05/01 1249) BP: (110-120)/(66-91) 116/66 (05/01 1249) SpO2:  [99 %-100 %] 100 % (05/01 1249) Weight:  [073 kg] 112 kg (05/01 0500)  General - Well nourished, well developed, in no apparent distress.  Ophthalmologic - fundi not visualized due to noncooperation.  Cardiovascular - Regular rhythm and rate.  Neuro - awake alert eyes open, nonverbal, not following commands, not able to name and repeat.  Right gaze preference, able to track to cross midline but left gaze incompleate.  Blinking to visual threat on the right but not on the left.  Left facial droop.  Left upper extremity flaccid, left LE knee flexion with pain at left foot.  Right upper extremity spontaneous movement against gravity.  Right lower extremity at least 3/5 spontaneously. Sensation, coordination and gait not  tested..   ASSESSMENT/PLAN [name] a 35 y.o.femalewith history of multiple strokes who presented as a code stroke with symptoms suggestive of LVO. tPA was not given due to Eliquis. CTA head and neck revealed an occlusion of distal right M2 MCA with partial reconstitution and subsequent reocclusion of most M2 branches. Patient underwent thrombectomy.   Recurrent cryptogenic embolic strokes -Acute infarct of the R MCA due to right M1 occlusion s/p IR with TICI2c,cryptogenic etiology  Code Stroke:CT head No acuteintracranial hemorrhage.Acute infarcts in the right MCAterritory.ASPECTS8.   CTA head & neckOcclusion of distal right M1 MCA with partial reconstitution and subsequent reocclusion of most M2 branches. Poor collaterals.  CTPerfusion imaging demonstrates core infarction 99 mL and penumbra of 34 mL. The extent of core infarction is calculated at greater than apparent loss of gray-white differentiation on noncontrast head CT. Therefore, penumbra may be greater.  2D EchoLVEF 60-65%, no structural abnormalities noted that could increase risk for CVA  Repeat CT Head 4/24-large left MCA infarct, no midline shift. Small SAH sylvian fissure, diminishing in size  Hypercoagulable panel: Homocysteine 15.1 due to smoking, otherwise negative to date  LDL48  HgbA1c5.7  VTE prophylaxis -SCDs+ Lovenox  aspirin 325 mg daily and Eliquis (apixaban) dailyprior to admission, now  on ASA 325 with warfarin started on 4/27. Goal INR 2.5-3.5  PT-INR 1.0->1.2->1.5->2.2  Therapy recommendations:SNF  Disposition:Pending  Hx of cryptogenic strokes  In 08/2017 patient admitted for left sided numbness and headache.  MRI showed right cerebellum, right temporoparietal small infarcts.  EF 55 to 60%, CT head and neck unremarkable.  LDL 89, A1c 4.8.  TCD bubble study showed trivial PFO.  DVT negative.  TEE no PFO.  Hypercoagulable and autoimmune work-up negative.   Discharged with aspirin and Lipitor.  01/2018 episode of left facial numbness and left leg weakness.  CT head and neck unremarkable.  MRI negative.  06/2018 admitted to [facility] for right hand/face numbness.  MRI showed right tiny parietal infarct.  10/2020 admitted to [facility] for aphasia status post tPA.  CTA neck showed left M2 occlusion.  MRI showed left MCA and punctate right frontal infarct.  TCD bubble study potential trivial PFO, TEE tiny PFO.  11/2020 she was referred to loop recorder which was not done as physician thought she should be candidate for anticoagulation.  Started on Eliquis 5 mg twice daily.  Leukocytosis- resolved  Afebrile  WBC 11.7>9.8->10.0  UA negative  Tachycardia, resolved  Labtealol 100mg  BID  HR 90s  Hyperlipidemia  Home meds:atrovastatin 80mg  and zetia 10mg    Will restart post CT if stable  LDL 48, goal < 70  Resumed, on lipitor 80 and zetia 10  Continue statin at discharge  Dysphagia- DYS diet 1  Cortrack placementfor TF  SLP following  Nocturnal tube feed to 7pm-7am  Calorie count through the weekend  Birth control  Recommend OP PCP or OBGYN discontinue Depo - provera due to numerous Contraindications including hx of tobacco use, migraines, and multiple strokes.  Tobacco abuse  Current smoker  Smoking cessation counselingwill beprovided  Contraindicated for birth control Depo  Hx of pseudotumor cerebri Chronic migraine  Follows with Dr. 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  Other Stroke Risk Factors  MorbidObesity,Body mass index is 42.12 kg/m., BMI >/= 30 associated with increased stroke risk, recommend weight loss, diet and exercise as appropriate   Obstructive sleep apnea  Other acute issues  Hypokalemia - potassium - 3.8->3.2 - supplement   Hospital day # 10  Terrace Arabia, MD PhD Stroke  Neurology 04/12/2021 7:57 PM     To contact Stroke Continuity provider, please refer to . After hours, contact General Neurology

## 2021-04-13 ENCOUNTER — Ambulatory Visit: Payer: Medicare Other

## 2021-04-13 DIAGNOSIS — R1312 Dysphagia, oropharyngeal phase: Secondary | ICD-10-CM

## 2021-04-13 DIAGNOSIS — I6601 Occlusion and stenosis of right middle cerebral artery: Secondary | ICD-10-CM | POA: Diagnosis not present

## 2021-04-13 DIAGNOSIS — I63511 Cerebral infarction due to unspecified occlusion or stenosis of right middle cerebral artery: Secondary | ICD-10-CM | POA: Diagnosis not present

## 2021-04-13 LAB — CBC
HCT: 35.2 % — ABNORMAL LOW (ref 36.0–46.0)
Hemoglobin: 10.9 g/dL — ABNORMAL LOW (ref 12.0–15.0)
MCH: 28.8 pg (ref 26.0–34.0)
MCHC: 31 g/dL (ref 30.0–36.0)
MCV: 93.1 fL (ref 80.0–100.0)
Platelets: 252 10*3/uL (ref 150–400)
RBC: 3.78 MIL/uL — ABNORMAL LOW (ref 3.87–5.11)
RDW: 15 % (ref 11.5–15.5)
WBC: 9.6 10*3/uL (ref 4.0–10.5)
nRBC: 0 % (ref 0.0–0.2)

## 2021-04-13 LAB — GLUCOSE, CAPILLARY
Glucose-Capillary: 105 mg/dL — ABNORMAL HIGH (ref 70–99)
Glucose-Capillary: 113 mg/dL — ABNORMAL HIGH (ref 70–99)
Glucose-Capillary: 97 mg/dL (ref 70–99)
Glucose-Capillary: 77 mg/dL (ref 70–99)
Glucose-Capillary: 94 mg/dL (ref 70–99)
Glucose-Capillary: 90 mg/dL (ref 70–99)

## 2021-04-13 LAB — BASIC METABOLIC PANEL
Anion gap: 10 (ref 5–15)
BUN: 22 mg/dL — ABNORMAL HIGH (ref 6–20)
CO2: 23 mmol/L (ref 22–32)
Calcium: 8.9 mg/dL (ref 8.9–10.3)
Chloride: 107 mmol/L (ref 98–111)
Creatinine, Ser: 0.62 mg/dL (ref 0.44–1.00)
GFR, Estimated: >60 mL/min (ref >60)
Glucose, Bld: 117 mg/dL — ABNORMAL HIGH (ref 70–99)
Potassium: 3.7 mmol/L (ref 3.5–5.1)
Sodium: 140 mmol/L (ref 135–145)

## 2021-04-13 LAB — PROTIME-INR
INR: 3 — ABNORMAL HIGH (ref 0.8–1.2)
Prothrombin Time: 30.7 seconds — ABNORMAL HIGH (ref 11.4–15.2)

## 2021-04-13 MED ORDER — OSMOLITE 1.5 CAL PO LIQD
1000.0000 mL | ORAL | Status: DC
  Administered 2021-04-13 – 2021-05-09 (×16): 1000 mL
  Filled 2021-04-13 (×5): qty 1000

## 2021-04-13 MED ORDER — WARFARIN SODIUM 5 MG PO TABS: 5.0000 mg | ORAL_TABLET | Freq: Once | ORAL | Status: DC

## 2021-04-13 MED ORDER — FREE WATER
150.0000 mL | Freq: Three times a day (TID) | Status: DC
  Administered 2021-04-13 – 2021-04-18 (×11): 150 mL

## 2021-04-13 MED ORDER — PROSOURCE TF PO LIQD
90.0000 mL | Freq: Two times a day (BID) | ORAL | Status: DC
  Administered 2021-04-13 – 2021-05-15 (×64): 90 mL
  Filled 2021-04-13 (×64): qty 90

## 2021-04-13 NOTE — Progress Notes (Addendum)
STROKE TEAM PROGRESS NOTE   INTERVAL HISTORY No acute events  No visitors at bedside.   Today patient is lying in bed with eyes open. Does not track or focus on examiner. Remains aphasic. Does not follow commands.   Per discussion with RD calorie count shows zero intake x 3 days on nocturnal only feeding.    Vitals:   04/12/21 2348 04/13/21 0348 04/13/21 0500 04/13/21 0734  BP: 139/66 132/62  123/71  Pulse: 89 91  98  Resp: 16 15  14   Temp: 98 F (36.7 C) 98.2 F (36.8 C)  (!) 96.8 F (36 C)  TempSrc: Axillary Axillary  Oral  SpO2: 100% 100%  99%  Weight:   112.4 kg   Height:       CBC:  Recent Labs  Lab 04/10/21 0837 04/11/21 0241 04/12/21 0243 04/13/21 0428  WBC 9.8 10.0 9.4 9.6  NEUTROABS 6.5 6.7  --   --   HGB 11.4* 11.1* 11.5* 10.9*  HCT 36.2 34.7* 36.2 35.2*  MCV 92.3 90.4 91.4 93.1  PLT 213 233 244 252   Basic Metabolic Panel:  Recent Labs  Lab 04/12/21 0243 04/13/21 0428  NA 139 140  K 3.2* 3.7  CL 106 107  CO2 24 23  GLUCOSE 96 117*  BUN 23* 22*  CREATININE 0.68 0.62  CALCIUM 9.0 8.9   IMAGING past 24 hours No results found.  PHYSICAL EXAM  Temp:  [96.8 F (36 C)-98.2 F (36.8 C)] 96.8 F (36 C) (05/02 0734) Pulse Rate:  [89-98] 98 (05/02 0734) Resp:  [14-17] 14 (05/02 0734) BP: (123-140)/(62-86) 123/71 (05/02 0734) SpO2:  [98 %-100 %] 99 % (05/02 0734) Weight:  [112.4 kg] 112.4 kg (05/02 0500)  General - Well nourished, well developed, in no apparent distress.  Resp: No extra work of breathing.  Cardiovascular - Regular rhythm and rate. Abd, large, round, soft, NTTP Ext: No edema Skin: warm and dry  Neuro - awake alert eyes open, nonverbal, not following commands. Right gaze preference, did not track today.   Blinking to visual threat on the right but not on the left.  Left facial droop.  Left upper extremity flaccid, left LE knee flexion with pain at left foot.  Right upper extremity spontaneous movement against gravity.  Right lower  extremity at least 3/5 spontaneously. Sensation, coordination and gait not able to be tested.   ASSESSMENT/PLAN Krista L Matthewsis a 35 y.o.femalewith history of multiple strokes who presented as a code stroke with symptoms suggestive of LVO. tPA was not given due to Eliquis. CTA head and neck revealed an occlusion of distal right M2 MCA with partial reconstitution and subsequent reocclusion of most M2 branches. Patient underwent thrombectomy.   Recurrent cryptogenic embolic strokes -Acute infarct of the R MCA due to right M1 occlusion s/p IR with TICI2c,cryptogenic etiology  Code Stroke:CT head No acuteintracranial hemorrhage.Acute infarcts in the right MCAterritory.ASPECTS8.   CTA head & neckOcclusion of distal right M1 MCA with partial reconstitution and subsequent reocclusion of most M2 branches. Poor collaterals.  CTPerfusion imaging demonstrates core infarction 99 mL and penumbra of 34 mL. The extent of core infarction is calculated at greater than apparent loss of gray-white differentiation on noncontrast head CT. Therefore, penumbra may be greater.  2D EchoLVEF 60-65%, no structural abnormalities noted that could increase risk for CVA  Repeat CT Head 4/24-large left MCA infarct, no midline shift. Small SAH sylvian fissure, diminishing in size  Hypercoagulable panel: Homocysteine 15.1 due to smoking, otherwise  negative to date  LDL48  HgbA1c5.7  VTE prophylaxis -SCDs+ Lovenox  aspirin 325 mg daily and Eliquis (apixaban) dailyprior to admission, on ASA 325 with warfarin (Goal INR 2.5-3.5) started on 4/27 and last given 5/1.  Warfarin now on hold with plan to transition to heparin gtt (pharmacy consulted) in setting of possible PEG placement. Pharmacist recommends starting heparin gtt when INR less than 2.5.   PT-INR 1.0->1.2->1.5->2.2->3.0  Therapy recommendations:SNF  Disposition:Pending  Hx of cryptogenic strokes  In 08/2017 patient  admitted for left sided numbness and headache.  MRI showed right cerebellum, right temporoparietal small infarcts.  EF 55 to 60%, CT head and neck unremarkable.  LDL 89, A1c 4.8.  TCD bubble study showed trivial PFO.  DVT negative.  TEE no PFO.  Hypercoagulable and autoimmune work-up negative.  Discharged with aspirin and Lipitor.  01/2018 episode of left facial numbness and left leg weakness.  CT head and neck unremarkable.  MRI negative.  06/2018 admitted to [facility] for right hand/face numbness.  MRI showed right tiny parietal infarct.  10/2020 admitted to [facility] for aphasia status post tPA.  CTA neck showed left M2 occlusion.  MRI showed left MCA and punctate right frontal infarct.  TCD bubble study potential trivial PFO, TEE tiny PFO.  11/2020 she was referred to loop recorder which was not done as physician thought she should be candidate for anticoagulation.  Started on Eliquis 5 mg twice daily.  Leukocytosis- resolved  Afebrile  WBC 11.7>9.8->10.0  UA negative  Tachycardia, resolved  Labtealol 100mg  BID  HR 90s  Hyperlipidemia  Home meds:atrovastatin 80mg  and zetia 10mg    Will restart post CT if stable  LDL 48, goal < 70  Resumed, on lipitor 80 and zetia 10  Continue statin at discharge  Dysphagia- DYS diet 1  Cortrack placementfor TF  SLP and RD following  Zero intake x 3 day calorie count on nocturnal feedings alone.   Planning for PEG: Father is and elects to consider PEG placement per our phone discussion today. Dr. , trauma surgery, consulted.   RD to restart full tube feeding regimen  Birth control  Recommend OP PCP or OBGYN discontinue Depo - provera due to numerous Contraindications including hx of tobacco use, migraines, and multiple strokes.  Tobacco abuse  Current smoker  Smoking cessation counselingwill beprovided  Contraindicated for birth control Depo  Hx of pseudotumor cerebri Chronic migraine  Follows  with Dr. 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  Other Stroke Risk Factors  MorbidObesity,Body mass index is 42.12 kg/m., BMI >/= 30 associated with increased stroke risk, recommend weight loss, diet and exercise as appropriate   Obstructive sleep apnea  Other acute issues  Hypokalemia - potassium - 3.8->3.2->3.7   Hospital day # 11 This plan of care was directed by Dr. Terrace Arabia.  [facility], NP-C   ATTENDING NOTE: I reviewed above note and agree with the assessment and plan. Pt was seen and examined.   No acute event overnight, patient neuro unchanged, still have significant neuro deficit with global aphasia, right gaze and left hemiplegia.  Patient had skin rash with blisters, concerning for shingles.  Will need to move to negative pressure room.  Patient at 0% intake for the last 3 days with nocturnal tube feeding.  Dietitian on board, will resume continuous tube feeding, and consider PEG tube placement.  Trauma surgery consulted, will hold off Coumadin,  start heparin IV once INR lower than 2.5.  PT/OT recommend SNF.  Pending SNF placement once PEG tube placed.  For detailed assessment and plan, please refer to above as I have made changes wherever appropriate.   Marvel Plan, MD PhD Stroke Neurology 04/13/2021 5:18 PM     To contact Stroke Continuity provider, please refer to [email or web address]. After hours, contact General Neurology

## 2021-04-13 NOTE — TOC CAGE-AID Note (Addendum)
Transition of Care [facility]) - CAGE-AID Screening   Patient Details  Name: Krista Maddox MRN: [id number] Date of Birth: 04/08/1986  [facility] CM/SW Contact:    Erin Sons, LCSW Phone Number: 04/13/2021, 2:56 PM   Clinical Narrative:  Pt is disoriented x4 at this time. Unable to participate in CAGE-AID Screening.   CAGE-AID Screening: Substance Abuse Screening unable to be completed due to: : Patient unable to participate             Substance Abuse Education Offered: No

## 2021-04-13 NOTE — Progress Notes (Signed)
Nutrition Follow-up  DOCUMENTATION CODES:   Obesity unspecified  INTERVENTION:  D/c calorie count, pt not eating D/c Ensure  D/c Magic Cup  Pt needs to have 100% of nutritional needs met via TF. Will transition pt back to 24 hour continuous feeding via Cortrak: -Osmolite 1.5 at 50 ml/h (1200 ml per day) -Prosource TF 90 ml BID  -127m free water flush Q8H   Provides 1960 kcal, 119 gm protein, 914 ml free water daily (19620mtotal free water with IVF at current rate and FWF as ordered above)  Recommend beginning conversation regarding PEG placement for continuation of nutrition support long-term.   NUTRITION DIAGNOSIS:   Inadequate oral intake related to inability to eat as evidenced by NPO status. - progressing, pt on dysphagia 1 diet with thin liquids  GOAL:   Patient will meet greater than or equal to 90% of their needs - progressing, being addressed with TF  MONITOR:   Supplement acceptance,Diet advancement,Labs,PO intake,TF tolerance,Weight trends  REASON FOR ASSESSMENT:   Consult Enteral/tube feeding initiation and management  ASSESSMENT:   Pt with PMH of prior strokes x 4 with mild residual aphasia and L sided numbness, chronic MHAs, HLD, TTP duing pregnancy admitted for R gaze preference, L sided weakness, aphasia and L facial droop with R MCA stroke s.p thrombectomy.  4/22 cortrak placed (gastric tip) 4/27 diet advanced to dysphagia 1 with thin liquids  Discussed pt with MD and NP. Pt had calorie count conducted over the weekend, but consumed 0% of meals/supplements despite tube feeding being held during the daytime to promote appetite. Per discussion with care team, recommend placement of PEG for continuation of nutrition support. Pt has previously tolerated Osmolite 1.5 @ 5051mr via Cortrak with 25m61moousrce TF BID. Pt also tolerated nocturnal rate of Osmolite 1.5 @ 75ml78mwith 45ml 91mource TF TID and FWF. After discussion with MD/NP, decision made to  return pt to continuous feeding to meet 100% of nutritional needs; NP to begin discussions regarding PEG with pt's family.   Admit wt: 114.8 kg Current wt: 112.4 kg  UOP: 1.4L x24 hours  Medications: protonix, senokot-s, coumadin IVF: NS @ 25ml/h74mbs reviewed. CBGs [id number]:   Diet Order            DIET - DYS 1 Room service appropriate? No; Fluid consistency: Thin  Diet effective now                 EDUCATION NEEDS:   No education needs have been identified at this time  Skin:  Skin Assessment: Reviewed RN Assessment  Last BM:  5/1 type 6  Height:   Ht Readings from Last 1 Encounters:  04/02/21 5' 5"  (1.651 m)    Weight:   Wt Readings from Last 1 Encounters:  04/13/21 112.4 kg    Ideal Body Weight:  56.8 kg  BMI:  Body mass index is 41.24 kg/m.  Estimated Nutritional Needs:   Kcal:  1900-2100  Protein:  110-125 grams  Fluid:  > 2 L/day    Carsin Randazzo Larkin InaD, LDN RD pager number and weekend/on-call pager number located in [email or web address]

## 2021-04-13 NOTE — Progress Notes (Signed)
Physical Therapy Treatment Patient Details Name: Krista Maddox MRN: [id number] DOB: September 27, 1986 Today's Date: 04/13/2021    History of Present Illness The pt is a 35 yo female presenting with L sided facial droop and L sided weakness on 4/21. Underwent angiogram and revascularization on 4/21. 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    PT Comments    Pt received sleeping in bed, easily aroused. Tolerated passive cervical stretch better today than last week, no grimacing noted, but pt does not maintain position and returns to R lateral flexion as soon as pressure is released. Pt smiled at therapist several times in session but otherwise unengaged. Tot A +2 to some to sitting EOB. Pt sat initially with min-guard A but after performing WB'ing through each elbow and PROM to BLE's, pt fatigued and needed mod A to sit due to fwd lean. Decreasing frequency to 2x per week due to minimal progress. Continue to recommend SNF at d/c. PT will continue to follow.    Follow Up Recommendations  SNF;Supervision/Assistance - 24 hour     Equipment Recommendations  None recommended by PT    Recommendations for Other Services       Precautions / Restrictions Precautions Precautions: Fall Precaution Comments: R gaze, L hemi Required Braces or Orthoses: Other Brace Other Brace: L PRAFO Restrictions Weight Bearing Restrictions: No    Mobility  Bed Mobility Overal bed mobility: Needs Assistance Bed Mobility: Supine to Sit;Sit to Supine     Supine to sit: +2 for physical assistance;Total assist;+2 for safety/equipment Sit to supine: Total assist;+2 for physical assistance;+2 for safety/equipment   General bed mobility comments: went to R side of bed this time to see if she would engage more since she maintains R head turn but still needed +2 tot A. Pt did grasp R rail with hand once up in sitting. Tot A +2 to return to supine to R side.     Transfers                 General transfer comment: unable  Ambulation/Gait             General Gait Details: unable   Stairs             Wheelchair Mobility    Modified Rankin (Stroke Patients Only) Modified Rankin (Stroke Patients Only) Pre-Morbid Rankin Score: Moderate disability Modified Rankin: Severe disability     Balance Overall balance assessment: Needs assistance Sitting-balance support: Feet supported;Single extremity supported;No upper extremity supported Sitting balance-Leahy Scale: Poor Sitting balance - Comments: initially sat with min-guard A but as she fatigued began to have fwd lean needing mod A to maintain sitting. Worked on [employer] on L elbow and then R and returning to upright. Postural control:  (anterior lean)                                  Cognition Arousal/Alertness: Awake/alert Behavior During Therapy: Flat affect Overall Cognitive Status: Impaired/Different from baseline                                 General Comments: pt using R hand to scratch face and back purposefully. Also smiled at therapist a couple of times when I was on her R. Otherwise not interactive      Exercises General Exercises -  Upper Extremity Wrist Flexion: PROM;Left;10 reps Wrist Extension: Left;PROM;10 reps General Exercises - Lower Extremity Ankle Circles/Pumps: PROM;Both;10 reps;Supine Long Arc Quad: PROM;Both;10 reps;Seated    General Comments General comments (skin integrity, edema, etc.): VSS on RA. L PRAFO replaced after session. Noted L foot to be very cold. Passive stretch to neck to achieve midline, maintained 1 min, did not seem uncomfortable for pt today      Pertinent Vitals/Pain Pain Assessment: Faces Faces Pain Scale: No hurt    Home Living                      Prior Function            PT Goals (current goals can now be found in the care plan section) Acute Rehab PT Goals Patient  Stated Goal: unable PT Goal Formulation: With patient Time For Goal Achievement: 04/17/21 Potential to Achieve Goals: Fair Progress towards PT goals: Not progressing toward goals - comment    Frequency    Min 2X/week      PT Plan Current plan remains appropriate;Frequency needs to be updated    Co-evaluation              AM-PAC PT "6 Clicks" Mobility   Outcome Measure  Help needed turning from your back to your side while in a flat bed without using bedrails?: Total Help needed moving from lying on your back to sitting on the side of a flat bed without using bedrails?: Total Help needed moving to and from a bed to a chair (including a wheelchair)?: Total Help needed standing up from a chair using your arms (e.g., wheelchair or bedside chair)?: Total Help needed to walk in hospital room?: Total Help needed climbing 3-5 steps with a railing? : Total 6 Click Score: 6    End of Session   Activity Tolerance: Patient tolerated treatment well Patient left: in bed;with call bell/phone within reach;with bed alarm set Nurse Communication: Mobility status;Other (comment) (PRAFO on L foot) PT Visit Diagnosis: Hemiplegia and hemiparesis;Other abnormalities of gait and mobility (R26.89) Hemiplegia - Right/Left: Left Hemiplegia - dominant/non-dominant: Non-dominant Hemiplegia - caused by: Cerebral infarction     Time: 2549-8264 PT Time Calculation (min) (ACUTE ONLY): 24 min  Charges:  $Therapeutic Activity: 23-37 mins                     Lyanne Co, PT  Acute Rehab Services  Pager [phone] Office [phone]    Lawana Chambers Monserat Prestigiacomo 04/13/2021, 10:47 AM

## 2021-04-13 NOTE — Plan of Care (Signed)
  Problem: Education: Goal: Knowledge of disease or condition will improve Outcome: Progressing Goal: Knowledge of secondary prevention will improve Outcome: Progressing Goal: Knowledge of patient specific risk factors addressed and post discharge goals established will improve Outcome: Progressing Goal: Individualized Educational Video(s) Outcome: Progressing   Problem: Coping: Goal: Will verbalize positive feelings about self Outcome: Progressing Goal: Will identify appropriate support needs Outcome: Progressing   Problem: Health Behavior/Discharge Planning: Goal: Ability to manage health-related needs will improve Outcome: Progressing   Problem: Self-Care: Goal: Verbalization of feelings and concerns over difficulty with self-care will improve Outcome: Progressing Goal: Ability to communicate needs accurately will improve Outcome: Progressing   Problem: Nutrition: Goal: Risk of aspiration will decrease Outcome: Progressing Goal: Dietary intake will improve Outcome: Progressing   Problem: Ischemic Stroke/TIA Tissue Perfusion: Goal: Complications of ischemic stroke/TIA will be minimized Outcome: Progressing   Problem: Education: Goal: Knowledge of General Education information will improve Description: Including pain rating scale, medication(s)/side effects and non-pharmacologic comfort measures Outcome: Progressing   Problem: Health Behavior/Discharge Planning: Goal: Ability to manage health-related needs will improve Outcome: Progressing   Problem: Clinical Measurements: Goal: Ability to maintain clinical measurements within normal limits will improve Outcome: Progressing Goal: Will remain free from infection Outcome: Progressing Goal: Diagnostic test results will improve Outcome: Progressing Goal: Respiratory complications will improve Outcome: Progressing Goal: Cardiovascular complication will be avoided Outcome: Progressing   Problem: Activity: Goal:  Risk for activity intolerance will decrease Outcome: Progressing   Problem: Nutrition: Goal: Adequate nutrition will be maintained Outcome: Progressing   Problem: Coping: Goal: Level of anxiety will decrease Outcome: Progressing   Problem: Elimination: Goal: Will not experience complications related to bowel motility Outcome: Progressing Goal: Will not experience complications related to urinary retention Outcome: Progressing   Problem: Pain Managment: Goal: General experience of comfort will improve Outcome: Progressing   Problem: Safety: Goal: Ability to remain free from injury will improve Outcome: Progressing   Problem: Skin Integrity: Goal: Risk for impaired skin integrity will decrease Outcome: Progressing

## 2021-04-13 NOTE — Progress Notes (Addendum)
ANTICOAGULATION CONSULT NOTE  Pharmacy Consult for warfarin  Indication:  Acute stroke  Allergies  Allergen Reactions  . Tape Rash    Cannot tolerate paper tape    Patient Measurements: Height: 5\' 5"  (165.1 cm) Weight: 112.4 kg (247 lb 12.8 oz) IBW/kg (Calculated) : 57   Vital Signs: Temp: 96.8 F (36 C) (05/02 0734) Temp Source: Oral (05/02 0734) BP: 123/71 (05/02 0734) Pulse Rate: 98 (05/02 0734)  Labs: Recent Labs    04/11/21 0241 04/12/21 0243 04/13/21 0428  HGB 11.1* 11.5* 10.9*  HCT 34.7* 36.2 35.2*  PLT 233 244 252  LABPROT 17.6* 24.1* 30.7*  INR 1.5* 2.2* 3.0*  CREATININE 0.58 0.68 0.62    Estimated Creatinine Clearance: 123.9 mL/min (by C-G formula based on SCr of 0.62 mg/dL).  Assessment: 35 y.o female on  apixaban PTA for hx prior strokes, ?DVT.  Last dose PTA " unknown", admitted 04/02/21 with acute CVA.  No TPA due to Apixaban pta.   -4/21 s/p thrombectomy. -Neurologist consulted pharmacist on 04/08/21 to start warfarin with goal INR 2.5-3.5 per Dr. 04/10/21.  Baseline INR 1.2 on admit 4/21 Hypercoagulable panel:  Homocysteine serum slightly elevated at 15.1, otherwise negative to date.   For warfarin naive patient, her warfarin point score  =8 pts , using her age, weight, and indication.  I will start warfarin  dose lower than the recommended dose per nomogram due to acute CVA  Dr. 5/21 recommends OP PCP or OBGYN to d/c Depo-provera due to numerous contraindications including h/o multiple strokes, tobacco use and migraines.    INR therapeutic at 3 today but has had a rapid rise so will reduce dose again today. Hgb stable in 10-11s (13.3 on admit), pltc wnl stable .  No acute bleeding noted.   Goal of Therapy:  INR 2.5-3.5 per Neurologist  Monitor platelets by anticoagulation protocol: Yes   Plan:  Warfarin 5 mg per tube today x1 Daily INR  Monitor for bleeding Warfarin education book/info sheet ordered  Will educate patient /or family  caregiver Also on ASA 325 mg per tube daily per Neuro  Thank you for involving pharmacy in this patient's care.  12-11, PharmD, BCPS Clinical Pharmacist Clinical phone for 04/13/2021 until 3p is [phone] 04/13/2021 9:39 AM  **Pharmacist phone directory can be found on [email or web address] listed under [facility]**  Addendum: Plan is to place PEG - warfarin on hold. Pharmacy consulted to transition to IV heparin drip. INR is 3 today - will start heparin drip when INR <2.5. Discussed this with Delila, Neuro NP. F/U in am.  [facility], PharmD, BCPS 2:23 PM

## 2021-04-13 NOTE — Consult Note (Signed)
Case reviewed. Full consult to follow. We will plan PEG in Endo this week as anticoagulation allows. Thank you. Violeta Gelinas, MD, MPH, FACS Please use [email or web address] to contact on call provider

## 2021-04-13 NOTE — Progress Notes (Signed)
Patient arrived to floor during shift change.  RN went into greet patient and father was at bedside; pt not following commands at this time and does not appear to be in pain.  Family was orientated to policy and procedure upon the floor.  Patient was assessed and telemetry monitor applied; fluids started and tube feed ordered.  Pt dry at this time; pt does have skin breakdown on labia due to purewick usage; pt placed in brief.  Fall mats placed down, bed alarm set, bed in lowest position; tv turned on and call bell within reach.  Father currently has no additional questions at this time

## 2021-04-14 DIAGNOSIS — I63511 Cerebral infarction due to unspecified occlusion or stenosis of right middle cerebral artery: Secondary | ICD-10-CM | POA: Diagnosis not present

## 2021-04-14 LAB — GLUCOSE, CAPILLARY
Glucose-Capillary: 102 mg/dL — ABNORMAL HIGH (ref 70–99)
Glucose-Capillary: 106 mg/dL — ABNORMAL HIGH (ref 70–99)
Glucose-Capillary: 116 mg/dL — ABNORMAL HIGH (ref 70–99)
Glucose-Capillary: 124 mg/dL — ABNORMAL HIGH (ref 70–99)
Glucose-Capillary: 95 mg/dL (ref 70–99)
Glucose-Capillary: 98 mg/dL (ref 70–99)

## 2021-04-14 LAB — PROTIME-INR
INR: 2.7 — ABNORMAL HIGH (ref 0.8–1.2)
Prothrombin Time: 28.5 seconds — ABNORMAL HIGH (ref 11.4–15.2)

## 2021-04-14 NOTE — Progress Notes (Signed)
ANTICOAGULATION CONSULT NOTE  Pharmacy Consult for warfarin  Indication:  Acute stroke  Allergies  Allergen Reactions  . Tape Rash    Cannot tolerate paper tape    Patient Measurements: Height: 5\' 5"  (165.1 cm) Weight: 112 kg (246 lb 14.6 oz) IBW/kg (Calculated) : 57   Vital Signs: Temp: 98.7 F (37.1 C) (05/03 1206) Temp Source: Axillary (05/03 1206) BP: 105/78 (05/03 1206) Pulse Rate: 99 (05/03 1206)  Labs: Recent Labs    04/12/21 0243 04/13/21 0428 04/14/21 0151  HGB 11.5* 10.9*  --   HCT 36.2 35.2*  --   PLT 244 252  --   LABPROT 24.1* 30.7* 28.5*  INR 2.2* 3.0* 2.7*  CREATININE 0.68 0.62  --     Estimated Creatinine Clearance: 123.6 mL/min (by C-G formula based on SCr of 0.62 mg/dL).  Assessment: 35 y.o female on  apixaban PTA for hx prior strokes, ?DVT.  Last dose PTA " unknown", admitted 04/02/21 with acute CVA.  No TPA due to Apixaban pta.   -4/21 s/p thrombectomy. -Neurologist consulted pharmacist on 04/08/21 to start warfarin with goal INR 2.5-3.5 per Dr. 04/10/21.  Baseline INR 1.2 on admit 4/21 Hypercoagulable panel:  Homocysteine serum slightly elevated at 15.1, otherwise negative to date.   Plan is to place PEG - warfarin on hold. Pharmacy consulted to transition to IV heparin drip. INR is 3 today - will start heparin drip when INR <2.5  INR today is 2.7.   Goal of Therapy:  INR 2.5-3.5 per Neurologist  Anti-Xa level 0.3-0.5  Monitor platelets by anticoagulation protocol: Yes   Plan:  Hold anticoagulation today. Will recheck INR tomorrow and start IV heparin when INR < 2.5  Daily INR  Monitor for bleeding   Thank you for involving pharmacy in this patient's care.  5/21, PharmD., BCPS, BCCCP Clinical Pharmacist Please refer to [facility] for unit-specific pharmacist

## 2021-04-14 NOTE — Consult Note (Signed)
[facility] Surgery Trauma Consult Note  Krista Maddox Feb 17, 1986  [id number].    Requesting MD: Marvel Plan, MD  Chief Complaint/Reason for Consult: PEG placement   HPI:  Krista Maddox is a 35 y/o F with history of recurrent cryptogenic strokes, aphasia, L sided deficits, chronic AC (Eliquis), tobacco abuse, and ITP during pregnancy who presented to the ED as a code stroke on 4/21 w/ rightward gaze, left sided weakness, and left facial droop. CT head and neck showed acute R MCA infarct. No tPA given. Patient underwent percutaneous thrombectomy by IR. Neurologically the patient is not following commands, is aphasic, and is on nocturnal tube feeds via cortrak. She is currently on DYS 1 diet but has taken in almost no PO intake for the last 72 hours and, per most recent SLP note, does not sufficiently recognize or attend to PO to initiate self feeding and, even with total assist, PO intake is minimal. Trauma has be consulted for PEG placement.  She is currently on coumadin which has been held and transition to a heparin gtt while awaitign PEG placement and coumadin to wear off.  ROS: Review of Systems  Unable to perform ROS: Medical condition    Family History  Problem Relation Age of Onset  . Diabetes Mother   . Hypertension Mother   . Ovarian cancer Mother        Hysterectomy  . Healthy Father   . Ovarian cancer Maternal Grandmother        Hysterectomy    Past Medical History:  Diagnosis Date  . DDD (degenerative disc disease), lumbar   . Headache   . Other disorders of optic nerve, not elsewhere classified, unspecified eye   . Stroke (HCC)   . TTP (thrombotic thrombocytopenic purpura)     Past Surgical History:  Procedure Laterality Date  . CESAREAN SECTION    . CHOLECYSTECTOMY    . IR CT HEAD LTD  04/02/2021  . IR PERCUTANEOUS ART THROMBECTOMY/INFUSION INTRACRANIAL INC DIAG ANGIO  04/02/2021  . RADIOLOGY WITH ANESTHESIA N/A 04/02/2021   Procedure: IR WITH  ANESTHESIA;  Surgeon: Radiologist, Medication, MD;  Location: [facility] OR;  Service: Radiology;  Laterality: N/A;  . TEE WITHOUT CARDIOVERSION N/A 08/22/2017   Procedure: TRANSESOPHAGEAL ECHOCARDIOGRAM (TEE);  Surgeon: Chilton Si, MD;  Location[facility] ENDOSCOPY;  Service: Cardiovascular;  Laterality: N/A;  . WISDOM TOOTH EXTRACTION      Social History:  reports that she quit smoking about 3 years ago. She smoked 0.50 packs per day. She has never used smokeless tobacco. She reports that she does not drink alcohol and does not use drugs.  Allergies:  Allergies  Allergen Reactions  . Tape Rash    Cannot tolerate paper tape    Medications Prior to Admission  Medication Sig Dispense Refill  . apixaban (ELIQUIS) 5 MG TABS tablet Take 5 mg by mouth 2 (two) times daily.    Marland Kitchen aspirin EC 325 MG tablet Take 1 tablet (325 mg total) by mouth daily. (Patient not taking: Reported on 12/19/2020) 30 tablet 0  . atenolol (TENORMIN) 50 MG tablet Take 50 mg by mouth daily.    Marland Kitchen atorvastatin (LIPITOR) 40 MG tablet Take 1 tablet (40 mg total) by mouth daily. (Patient not taking: No sig reported) 90 tablet 3  . atorvastatin (LIPITOR) 80 MG tablet Take 80 mg by mouth daily.    Marland Kitchen co-enzyme Q-10 50 MG capsule Take 50 mg by mouth 2 (two) times daily. (Patient not taking: Reported on 12/19/2020)    .  Erenumab-aooe (AIMOVIG) 140 MG/ML SOAJ Inject 140 mg into the skin every 30 (thirty) days. (Patient not taking: Reported on 12/19/2020) 3 mL 0  . ezetimibe (ZETIA) 10 MG tablet Take 10 mg by mouth daily.    Marland Kitchen gabapentin (NEURONTIN) 300 MG capsule TAKE 1 CAPSULE BY MOUTH TWICE DAILY, MAY INCREASE TO 2 TWICE DAILY AS NEEDED 120 capsule 11  . Magnesium Oxide 400 (240 Mg) MG TABS Take 400 mg by mouth 2 (two) times daily. 60 tablet 11  . meclizine (ANTIVERT) 12.5 MG tablet Take 12.5 mg by mouth 3 (three) times daily as needed for dizziness. (Patient not taking: Reported on 12/19/2020)    . medroxyPROGESTERone (DEPO-PROVERA) 150 MG/ML  injection Inject 150 mg into the muscle every 3 (three) months.    . meloxicam (MOBIC) 15 MG tablet TAKE ONE TABLET DAILY AS NEEDED WITH FOOD (Patient not taking: Reported on 12/19/2020) 90 tablet 0  . nortriptyline (PAMELOR) 50 MG capsule She is going to discuss further refills with her neurologist at [facility]. Her next appt is in June. (Patient not taking: Reported on 12/19/2020) 90 capsule 0  . NURTEC 75 MG TBDP Take 75 mg by mouth at bedtime.    . promethazine (PHENERGAN) 25 MG tablet Take 25 mg by mouth every 6 (six) hours as needed for nausea or vomiting.    . Riboflavin 100 MG TABS Take 1 tablet (100 mg total) by mouth 2 (two) times daily. 60 tablet 11  . topiramate (TOPAMAX) 100 MG tablet TAKE 1 & 1/2 TABLET (150 MG TOTAL) BY MOUTH 2 TIMES DAILY. 270 tablet 3  . venlafaxine XR (EFFEXOR-XR) 150 MG 24 hr capsule Take 150 mg by mouth daily with breakfast.      Blood pressure 120/70, pulse 89, temperature 99.5 F (37.5 C), temperature source Axillary, resp. rate 16, height 5\' 5"  (1.651 m), weight 112 kg, SpO2 99 %. Physical Exam: General: alert, but unresponsive obese white female who is laying in bed in NAD HEENT: head is normocephalic, atraumatic.  Sclera are noninjected.  PERRL.  Ears and nose without any masses or lesions. Cortrak in place.  Mouth is pink and moist Heart: regular, rate, and rhythm.  Normal s1,s2. No obvious murmurs, gallops, or rubs noted.  Palpable radial and pedal pulses bilaterally Lungs: CTAB, no wheezes, rhonchi, or rales noted.  Respiratory effort nonlabored Abd: soft, obese, ND, +BS, no masses, hernias, or organomegaly MS: all 4 extremities are symmetrical with no cyanosis, clubbing, or edema. But doesn't move left side.  Spontaneously moves the right side Skin: warm and dry with no masses, lesions, but does have a rash that is covered up in her posterior left flank Neuro: doesn't participate.  Doesn't follow commands Psych: Alert with eyes open, but doesn't follow  commands and unable to further assess.   Results for orders placed or performed during the hospital encounter of 04/02/21 (from the past 48 hour(s))  Glucose, capillary     Status: None   Collection Time: 04/12/21 12:36 PM  Result Value Ref Range   Glucose-Capillary 95 70 - 99 mg/dL    Comment: Glucose reference range applies only to samples taken after fasting for at least 8 hours.  Glucose, capillary     Status: None   Collection Time: 04/12/21  5:58 PM  Result Value Ref Range   Glucose-Capillary 70 70 - 99 mg/dL    Comment: Glucose reference range applies only to samples taken after fasting for at least 8 hours.   Comment 1  Document in Chart   Glucose, capillary     Status: Abnormal   Collection Time: 04/12/21  8:51 PM  Result Value Ref Range   Glucose-Capillary 125 (H) 70 - 99 mg/dL    Comment: Glucose reference range applies only to samples taken after fasting for at least 8 hours.   Comment 1 Notify RN    Comment 2 Document in Chart   Glucose, capillary     Status: Abnormal   Collection Time: 04/13/21  1:12 AM  Result Value Ref Range   Glucose-Capillary 105 (H) 70 - 99 mg/dL    Comment: Glucose reference range applies only to samples taken after fasting for at least 8 hours.   Comment 1 Notify RN    Comment 2 Document in Chart   Protime-INR     Status: Abnormal   Collection Time: 04/13/21  4:28 AM  Result Value Ref Range   Prothrombin Time 30.7 (H) 11.4 - 15.2 seconds   INR 3.0 (H) 0.8 - 1.2    Comment: (NOTE) INR goal varies based on device and disease states. Performed at [facility], [address] [place], [place] [address]   Basic metabolic panel     Status: Abnormal   Collection Time: 04/13/21  4:28 AM  Result Value Ref Range   Sodium 140 135 - 145 mmol/L   Potassium 3.7 3.5 - 5.1 mmol/L   Chloride 107 98 - 111 mmol/L   CO2 23 22 - 32 mmol/L   Glucose, Bld 117 (H) 70 - 99 mg/dL    Comment: Glucose reference range applies only to samples taken after  fasting for at least 8 hours.   BUN 22 (H) 6 - 20 mg/dL   Creatinine, Ser 6.040.62 0.44 - 1.00 mg/dL   Calcium 8.9 8.9 - 54.010.3 mg/dL   GFR, Estimated >98>60 >11>60 mL/min    Comment: (NOTE) Calculated using the CKD-EPI Creatinine Equation (2021)    Anion gap 10 5 - 15    Comment: Performed at [facility], [address] [place], [place] [address]  CBC     Status: Abnormal   Collection Time: 04/13/21  4:28 AM  Result Value Ref Range   WBC 9.6 4.0 - 10.5 K/uL   RBC 3.78 (L) 3.87 - 5.11 MIL/uL   Hemoglobin 10.9 (L) 12.0 - 15.0 g/dL   HCT 29.535.2 (L) 62.136.0 - 30.846.0 %   MCV 93.1 80.0 - 100.0 fL   MCH 28.8 26.0 - 34.0 pg   MCHC 31.0 30.0 - 36.0 g/dL   RDW 65.715.0 84.611.5 - 96.215.5 %   Platelets 252 150 - 400 K/uL   nRBC 0.0 0.0 - 0.2 %    Comment: Performed at [facility], [address] [place], [place] [address]  Glucose, capillary     Status: Abnormal   Collection Time: 04/13/21  5:00 AM  Result Value Ref Range   Glucose-Capillary 113 (H) 70 - 99 mg/dL    Comment: Glucose reference range applies only to samples taken after fasting for at least 8 hours.   Comment 1 Notify RN    Comment 2 Document in Chart   Glucose, capillary     Status: None   Collection Time: 04/13/21  7:35 AM  Result Value Ref Range   Glucose-Capillary 97 70 - 99 mg/dL    Comment: Glucose reference range applies only to samples taken after fasting for at least 8 hours.   Comment 1 Notify RN  Comment 2 Document in Chart   Glucose, capillary     Status: None   Collection Time: 04/13/21 11:23 AM  Result Value Ref Range   Glucose-Capillary 77 70 - 99 mg/dL    Comment: Glucose reference range applies only to samples taken after fasting for at least 8 hours.   Comment 1 Notify RN    Comment 2 Document in Chart   Glucose, capillary     Status: None   Collection Time: 04/13/21  5:07 PM  Result Value Ref Range   Glucose-Capillary 94 70 - 99 mg/dL    Comment: Glucose reference range applies only to samples taken after  fasting for at least 8 hours.  Glucose, capillary     Status: None   Collection Time: 04/13/21  7:42 PM  Result Value Ref Range   Glucose-Capillary 90 70 - 99 mg/dL    Comment: Glucose reference range applies only to samples taken after fasting for at least 8 hours.  Glucose, capillary     Status: Abnormal   Collection Time: 04/14/21 12:26 AM  Result Value Ref Range   Glucose-Capillary 124 (H) 70 - 99 mg/dL    Comment: Glucose reference range applies only to samples taken after fasting for at least 8 hours.  Protime-INR     Status: Abnormal   Collection Time: 04/14/21  1:51 AM  Result Value Ref Range   Prothrombin Time 28.5 (H) 11.4 - 15.2 seconds   INR 2.7 (H) 0.8 - 1.2    Comment: (NOTE) INR goal varies based on device and disease states. Performed at [facility], [address], [place], [place] [address]   Glucose, capillary     Status: Abnormal   Collection Time: 04/14/21  4:42 AM  Result Value Ref Range   Glucose-Capillary 102 (H) 70 - 99 mg/dL    Comment: Glucose reference range applies only to samples taken after fasting for at least 8 hours.   No results found.  Assessment/Plan PMH cryptogenic strokes HLD Tobacco abuse  Chronic migraine headaches Obesity  Shingles - Valtrex 1g TID x 7 days started 4/30; HSV/varicella zoster PCR swabs ordered 4/30 ? pending  Acute R MCA infarct, history of recurrent cryptogenic strokes Dysphagia  - continue to hold warfarin (last dose 5/1 PM), INR  2.7, pharmacy planning hep gtt once INR < 2.5.  - Will tentatively plan for PEG once INR at least under 1.7 ish range. -no family present during my visit, but by report has agreed to PEG tube placement. -will follow for readiness.   Letha Cape, [facility] Surgery Please see Amion for pager number during day hours 7:00am-4:30pm 04/14/2021, 9:02 AM

## 2021-04-14 NOTE — Progress Notes (Addendum)
STROKE TEAM PROGRESS NOTE   INTERVAL HISTORY No acute events  No visitors at bedside.   Sitting up in bed with eyes open. Does not track or attend to  examiner. Remains aphasic. Does not follow commands.  Her neuro exam is unchanged. Her VS are stable. She is transitioning off warfarin to heparin drip for PEG placement.    Vitals:   04/14/21 0445 04/14/21 0452 04/14/21 0725 04/14/21 1206  BP: 117/72  120/70 105/78  Pulse: 89   99  Resp: 16   19  Temp: 97.8 F (36.6 C)  99.5 F (37.5 C) 98.7 F (37.1 C)  TempSrc: Axillary  Axillary Axillary  SpO2: 99%   100%  Weight:  112 kg    Height:       CBC:  Recent Labs  Lab 04/10/21 0837 04/11/21 0241 04/12/21 0243 04/13/21 0428  WBC 9.8 10.0 9.4 9.6  NEUTROABS 6.5 6.7  --   --   HGB 11.4* 11.1* 11.5* 10.9*  HCT 36.2 34.7* 36.2 35.2*  MCV 92.3 90.4 91.4 93.1  PLT 213 233 244 252   Basic Metabolic Panel:  Recent Labs  Lab 04/12/21 0243 04/13/21 0428  NA 139 140  K 3.2* 3.7  CL 106 107  CO2 24 23  GLUCOSE 96 117*  BUN 23* 22*  CREATININE 0.68 0.62  CALCIUM 9.0 8.9   IMAGING past 24 hours No results found.  PHYSICAL EXAM  Temp:  [97.8 F (36.6 C)-99.5 F (37.5 C)] 98.7 F (37.1 C) (05/03 1206) Pulse Rate:  [89-99] 99 (05/03 1206) Resp:  [16-19] 19 (05/03 1206) BP: (105-133)/(70-93) 105/78 (05/03 1206) SpO2:  [98 %-100 %] 100 % (05/03 1206) Weight:  [694 kg] 112 kg (05/03 0452)  General - Well nourished, well developed, in no apparent distress.  Resp: No extra work of breathing.  Cardiovascular - Regular rhythm and rate. Abd, large, round, soft, NTTP Ext: No edema Skin: warm and dry  Neuro - awake alert eyes open, nonverbal, not following commands. Right gaze preference, did not track today.   Blinking to visual threat on the right but not on the left.  Left facial droop.  Left upper extremity flaccid, left LE knee flexion with pain at left foot.  Right upper extremity spontaneous movement against gravity.   Right lower extremity at least 3/5 spontaneously. Sensation, coordination and gait not able to be tested.   ASSESSMENT/PLAN [name] a 35 y.o.femalewith history of multiple strokes who presented as a code stroke with symptoms suggestive of LVO. tPA was not given due to Eliquis. CTA head and neck revealed an occlusion of distal right M2 MCA with partial reconstitution and subsequent reocclusion of most M2 branches. Patient underwent thrombectomy.   Recurrent cryptogenic embolic strokes -Acute infarct of the R MCA due to right M1 occlusion s/p IR with TICI2c,cryptogenic etiology  Code Stroke:CT head No acuteintracranial hemorrhage.Acute infarcts in the right MCAterritory.ASPECTS8.   CTA head & neckOcclusion of distal right M1 MCA with partial reconstitution and subsequent reocclusion of most M2 branches. Poor collaterals.  CTPerfusion imaging demonstrates core infarction 99 mL and penumbra of 34 mL. The extent of core infarction is calculated at greater than apparent loss of gray-white differentiation on noncontrast head CT. Therefore, penumbra may be greater.  2D EchoLVEF 60-65%, no structural abnormalities noted that could increase risk for CVA  Repeat CT Head 4/24-large left MCA infarct, no midline shift. Small SAH sylvian fissure, diminishing in size  Hypercoagulable panel: Homocysteine 15.1 due to smoking,  otherwise negative to date  LDL48  HgbA1c5.7  VTE prophylaxis -SCDs+ Lovenox  aspirin 325 mg daily and Eliquis (apixaban) dailyprior to admission, on ASA 325 with warfarin (Goal INR 2.5-3.5) started on 4/27 and last given 5/1.  Warfarin now on hold with plan to transition to heparin gtt (pharmacy consulted) in setting of possible PEG placement. Pharmacist recommends starting heparin gtt when INR less than 2.5.   PT-INR 1.0->1.2->1.5->2.2->3.0->2.7  Therapy recommendations:SNF  Disposition:Pending  Hx of cryptogenic strokes  In  08/2017 patient admitted for left sided numbness and headache.  MRI showed right cerebellum, right temporoparietal small infarcts.  EF 55 to 60%, CT head and neck unremarkable.  LDL 89, A1c 4.8.  TCD bubble study showed trivial PFO.  DVT negative.  TEE no PFO.  Hypercoagulable and autoimmune work-up negative.  Discharged with aspirin and Lipitor.  01/2018 episode of left facial numbness and left leg weakness.  CT head and neck unremarkable.  MRI negative.  06/2018 admitted to [facility] for right hand/face numbness.  MRI showed right tiny parietal infarct.  10/2020 admitted to [facility] for aphasia status post tPA.  CTA neck showed left M2 occlusion.  MRI showed left MCA and punctate right frontal infarct.  TCD bubble study potential trivial PFO, TEE tiny PFO.  11/2020 she was referred to loop recorder which was not done as physician thought she should be candidate for anticoagulation.  Started on Eliquis 5 mg twice daily.  Leukocytosis- resolved  Afebrile  WBC 11.7>9.8->10.0  UA negative  Tachycardia, resolved  Labtealol 100mg  BID  HR 90s  Hyperlipidemia  Home meds:atrovastatin 80mg  and zetia 10mg    Will restart post CT if stable  LDL 48, goal < 70  Resumed, on lipitor 80 and zetia 10  Continue statin at discharge  Dysphagia- DYS diet 1  Cortrack placementfor TF  SLP and RD following  Zero intake x 3 day calorie count on nocturnal feedings alone.   Planning for PEG: Father is and elects to consider PEG placement. Dr. , trauma surgery, consulted and following until anticoagulation status is appropriate for surgical intervention.   RD to restart full tube feeding regimen  Birth control  Recommend OP PCP or OBGYN discontinue Depo - provera due to numerous Contraindications including hx of tobacco use, migraines, and multiple strokes.  Tobacco abuse  Current smoker  Smoking cessation counselingwill beprovided  Contraindicated for birth  control Depo  Hx of pseudotumor cerebri Chronic migraine  Follows with Dr. 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  Other Stroke Risk Factors  MorbidObesity,Body mass index is 42.12 kg/m., BMI >/= 30 associated with increased stroke risk, recommend weight loss, diet and exercise as appropriate   Obstructive sleep apnea   Hospital day # 12 This plan of care was directed by Dr. Terrace Arabia.  [facility], NP-C  ATTENDING NOTE: I reviewed above note and agree with the assessment and plan. Pt was seen and examined.   No acute event overnight, no neuro changes. Pt transferred to negative pressure room due to possible shingle at left flank area. Varicella zoster PCR is still pending. Wound of care on board with dressing. Currently off coumadin in preparation for PEG placement. INR today 2.7. trauma surgery on board.   For detailed assessment and plan, please refer to above as I have made changes wherever appropriate.   , MD PhD Stroke Neurology 04/14/2021 10:58 PM  To contact Stroke Continuity provider, please refer to [email or web address] After hours, contact General Neurology

## 2021-04-15 DIAGNOSIS — I6601 Occlusion and stenosis of right middle cerebral artery: Secondary | ICD-10-CM | POA: Diagnosis not present

## 2021-04-15 DIAGNOSIS — I63511 Cerebral infarction due to unspecified occlusion or stenosis of right middle cerebral artery: Secondary | ICD-10-CM | POA: Diagnosis not present

## 2021-04-15 LAB — BASIC METABOLIC PANEL
Anion gap: 7 (ref 5–15)
BUN: 20 mg/dL (ref 6–20)
CO2: 22 mmol/L (ref 22–32)
Calcium: 8.8 mg/dL — ABNORMAL LOW (ref 8.9–10.3)
Chloride: 110 mmol/L (ref 98–111)
Creatinine, Ser: 0.64 mg/dL (ref 0.44–1.00)
GFR, Estimated: >60 mL/min (ref >60)
Glucose, Bld: 112 mg/dL — ABNORMAL HIGH (ref 70–99)
Potassium: 3.8 mmol/L (ref 3.5–5.1)
Sodium: 139 mmol/L (ref 135–145)

## 2021-04-15 LAB — GLUCOSE, CAPILLARY
Glucose-Capillary: 104 mg/dL — ABNORMAL HIGH (ref 70–99)
Glucose-Capillary: 105 mg/dL — ABNORMAL HIGH (ref 70–99)
Glucose-Capillary: 112 mg/dL — ABNORMAL HIGH (ref 70–99)
Glucose-Capillary: 112 mg/dL — ABNORMAL HIGH (ref 70–99)
Glucose-Capillary: 84 mg/dL (ref 70–99)
Glucose-Capillary: 92 mg/dL (ref 70–99)

## 2021-04-15 LAB — CBC
HCT: 36.2 % (ref 36.0–46.0)
Hemoglobin: 11.6 g/dL — ABNORMAL LOW (ref 12.0–15.0)
MCH: 28.8 pg (ref 26.0–34.0)
MCHC: 32 g/dL (ref 30.0–36.0)
MCV: 89.8 fL (ref 80.0–100.0)
Platelets: 268 10*3/uL (ref 150–400)
RBC: 4.03 MIL/uL (ref 3.87–5.11)
RDW: 14.9 % (ref 11.5–15.5)
WBC: 10.9 10*3/uL — ABNORMAL HIGH (ref 4.0–10.5)
nRBC: 0 % (ref 0.0–0.2)

## 2021-04-15 LAB — HSV DNA BY PCR (REFERENCE LAB)
HSV 1 DNA: NEGATIVE
HSV 2 DNA: NEGATIVE

## 2021-04-15 LAB — PROTIME-INR
INR: 1.7 — ABNORMAL HIGH (ref 0.8–1.2)
Prothrombin Time: 20.2 seconds — ABNORMAL HIGH (ref 11.4–15.2)

## 2021-04-15 LAB — HEPARIN LEVEL (UNFRACTIONATED): Heparin Unfractionated: 0.29 IU/mL — ABNORMAL LOW (ref 0.30–0.70)

## 2021-04-15 MED ORDER — HEPARIN (PORCINE) 25000 UT/250ML-% IV SOLN
1100.0000 [IU]/h | INTRAVENOUS | Status: DC
  Administered 2021-04-15: 1200 [IU]/h via INTRAVENOUS
  Filled 2021-04-15 (×2): qty 250

## 2021-04-15 NOTE — Progress Notes (Signed)
ANTICOAGULATION CONSULT NOTE - Follow Up Consult  Pharmacy Consult for Warfarin > IV Heparin Indication:  Acute stroke  Allergies  Allergen Reactions  . Tape Rash    Cannot tolerate paper tape    Patient Measurements: Height: 5\' 5"  (165.1 cm) Weight: 110.8 kg (244 lb 4.3 oz) IBW/kg (Calculated) : 57  Heparin dosing weight: 83 kg    Vital Signs: Temp: 98.8 F (37.1 C) (05/04 1700) Temp Source: Oral (05/04 1700) BP: 116/68 (05/04 1700) Pulse Rate: 95 (05/04 1610)  Labs: Recent Labs    04/13/21 0428 04/14/21 0151 04/15/21 0435 04/15/21 1822  HGB 10.9*  --  11.6*  --   HCT 35.2*  --  36.2  --   PLT 252  --  268  --   LABPROT 30.7* 28.5* 20.2*  --   INR 3.0* 2.7* 1.7*  --   HEPARINUNFRC  --   --   --  0.29*  CREATININE 0.62  --  0.64  --     Estimated Creatinine Clearance: 122.8 mL/min (by C-G formula based on SCr of 0.64 mg/dL).  Assessment: 35 yr old female on  apixaban PTA for hx prior strokes, ?DVT (last dose of apixaban PTA " unknown"), admitted 04/02/21 with acute CVA.  No TPA due to apixaban PTA.   - 4/21 S/P thrombectomy - Neurologist consulted pharmacist on 04/08/21 to start warfarin with goal INR 2.5-3.5, per Dr. 04/10/21  Baseline INR 1.2 on admit (4/21) Hypercoagulable panel:  Homocysteine serum slightly elevated at 15.1 (due to smoking), otherwise negative to date.   Plan is to place PEG - warfarin on hold. Pharmacy was consulted to transition to IV heparin drip when INR < 2.5.  INR today is down to 1.7.  H/H 11.6/36.2, plt 268  Initial heparin level ~5.5 hrs after starting heparin infusion (no bolus) at 1200 units/hr was 0.29 units/ml, which is below the goal range for this pt. Per RN, no issues with IV or bleeding observed.  Goal of Therapy:  INR:  2.5-3.5 per Neurologist  Heparin level: 0.3-0.5 units/ml Monitor platelets by anticoagulation protocol: Yes   Plan:  Increase heparin infusion to 1250 units/hr Check heparin level in 6 hrs Monitor  daily heparin level, CBC and signs/ss of bleeding  F/U plans for PEG placement and resumption of warfarin   Thank you for involving pharmacy in this patient's care.  06-17-1990, PharmD, BCPS, [facility] Clinical Pharmacist

## 2021-04-15 NOTE — Progress Notes (Signed)
  Speech Language Pathology Treatment: Dysphagia;Cognitive-Linquistic  Patient Details Name: Krista Maddox MRN: [id number] DOB: 1986-03-11 Today's Date: 04/15/2021 Time: 1324-4010 SLP Time Calculation (min) (ACUTE ONLY): 30 min  Assessment / Plan / Recommendation Clinical Impression  Pt seen after PT/OT session, pt sitting upright, fully alert. Pt tracking throughout right visual field today, sustained gaze at midline for 5 second intervals x5. Pt verbalized a few purposeful words/phrases today "Im cold" "blanket" " Puppy!" and "so cute."   Significant gains in attention and awareness with PO trials. Pt focused attention to OJ in a cup long enough to accept cup edge and even a straw and initiated several sips without a struggle. Also consumed 100% of a vanilla ice cream cup fed by SLP. Pt has been on a puree/thin diet with 0% intake recorded. Given how difficult it has been for pt to orally accept PO, this is unsurprising and PEG is needed. However, suspect that as a result staff has stopped offering or attempting PO though diet is in place. In her room her board says NPO, despite being on a diet, and her swallow sign from her prior room is gone. SLP addressed this with new RN on today and encouraged still offering pt PO from trays given that she needs opportunities to eat and drink to progress. Will continue efforts.    HPI HPI: Pt is a 36 y.o. female with a PMHx of prior strokes x 4 (right cerebellar and right posterior frontal infarctions in 2018 (while smoking and on oral contraceptives), as well as an acute occlusion of her left M2 on 11/09/2020 for which she was treated with thrombectomy at [facility] with sequelae of aphasia and left sided numbness), chronic MHAs, chronic AC with Eliquis, HLD, ? IIH, DDD, ITP during pregnancy, remote tobacco use, and disorder of optic nerve. Patient presented as a code stroke due to right gaze preference, left sided weakness, aphasia and left facial droop. UV:OZDGU  infarcts in the right MCA  territory; YQI:HKVQQVZDG of distal right M1 MCA with partial reconstitution and  subsequent reocclusion of most M2 branches. Poor collaterals. Pt s/p revascularization of occluded RT MCA. Cotrak placed 4/22      SLP Plan  Continue with current plan of care       Recommendations  Diet recommendations: Thin liquid;Dysphagia 1 (puree) Liquids provided via: Straw;Cup;Teaspoon Medication Administration: Via alternative means Supervision: Staff to assist with self feeding;Full supervision/cueing for compensatory strategies Compensations: Minimize environmental distractions Postural Changes and/or Swallow Maneuvers: Seated upright 90 degrees                Oral Care Recommendations: Oral care BID Follow up Recommendations: Inpatient Rehab SLP Visit Diagnosis: Cognitive communication deficit (L87.564) Plan: Continue with current plan of care       GO                Catalino Plascencia, Riley Nearing 04/15/2021, 3:04 PM

## 2021-04-15 NOTE — Progress Notes (Addendum)
STROKE TEAM PROGRESS NOTE   INTERVAL HISTORY No acute events overnight. No family at bedside. Pt lying in bed, eyes open, tracking on the right but still nonverbal not following commands with left hemianopia and left hemiplegia. INR 1.7, heparin IV started. Pending PEG placement.   Vitals:   04/15/21 0329 04/15/21 0500 04/15/21 0700 04/15/21 1100  BP: 115/75  98/62 121/72  Pulse: 91     Resp: 19     Temp: 99.3 F (37.4 C)  98.1 F (36.7 C) 97.9 F (36.6 C)  TempSrc: Axillary  Oral Axillary  SpO2: 98%     Weight:  110.8 kg    Height:       CBC:  Recent Labs  Lab 04/10/21 0837 04/11/21 0241 04/12/21 0243 04/13/21 0428 04/15/21 0435  WBC 9.8 10.0   < > 9.6 10.9*  NEUTROABS 6.5 6.7  --   --   --   HGB 11.4* 11.1*   < > 10.9* 11.6*  HCT 36.2 34.7*   < > 35.2* 36.2  MCV 92.3 90.4   < > 93.1 89.8  PLT 213 233   < > 252 268   < > = values in this interval not displayed.   Basic Metabolic Panel:  Recent Labs  Lab 04/13/21 0428 04/15/21 0435  NA 140 139  K 3.7 3.8  CL 107 110  CO2 23 22  GLUCOSE 117* 112*  BUN 22* 20  CREATININE 0.62 0.64  CALCIUM 8.9 8.8*   IMAGING past 24 hours No results found.  PHYSICAL EXAM  Temp:  [97.9 F (36.6 C)-99.3 F (37.4 C)] 97.9 F (36.6 C) (05/04 1100) Pulse Rate:  [91-97] 91 (05/04 0329) Resp:  [18-19] 19 (05/04 0329) BP: (98-121)/(62-75) 121/72 (05/04 1100) SpO2:  [98 %-100 %] 98 % (05/04 0329) Weight:  [110.8 kg] 110.8 kg (05/04 0500)  General - Well nourished, well developed, in no apparent distress.  Resp: No extra work of breathing.  Cardiovascular - Regular rhythm and rate. Neuro - awake alert eyes open, nonverbal, not following commands. Right gaze preference, did not track today.   Blinking to visual threat on the right but not on the left.  Left facial droop.  Left upper extremity flaccid, left LE knee flexion with pain at left foot.  Right upper extremity spontaneous movement against gravity.  Right lower extremity  at least 3/5 spontaneously. Sensation, coordination and gait not able to be tested.   ASSESSMENT/PLAN Ms.Robertta L Matthewsis a 35 y.o.femalewith history of multiple strokes who presented as a code stroke with symptoms suggestive of LVO. tPA was not given due to Eliquis. CTA head and neck revealed an occlusion of distal right M2 MCA with partial reconstitution and subsequent reocclusion of most M2 branches. Patient underwent thrombectomy.   Recurrent cryptogenic embolic strokes -Acute infarct of the R MCA due to right M1 occlusion s/p IR with TICI2c,cryptogenic etiology  Code Stroke:CT head No acuteintracranial hemorrhage.Acute infarcts in the right MCAterritory.ASPECTS8.   CTA head & neckOcclusion of distal right M1 MCA with partial reconstitution and subsequent reocclusion of most M2 branches. Poor collaterals.  CTPerfusion imaging demonstrates core infarction 99 mL and penumbra of 34 mL. The extent of core infarction is calculated at greater than apparent loss of gray-white differentiation on noncontrast head CT. Therefore, penumbra may be greater.  2D EchoLVEF 60-65%, no structural abnormalities noted that could increase risk for CVA  Repeat CT Head 4/24-large left MCA infarct, no midline shift. Small SAH sylvian fissure, diminishing in size  Hypercoagulable panel: Homocysteine 15.1 due to smoking, otherwise negative to date  LDL48  HgbA1c5.7  VTE prophylaxis -SCDs+ Lovenox  aspirin 325 mg daily and Eliquis (apixaban) dailyprior to admission, was on ASA 325 with warfarin (Goal INR 2.5-3.5) started on 4/27 and last given 5/1.  Warfarin now on hold and heparin IV now for PEG placement.   PT-INR 1.0->1.2->1.5->2.2->3.0->2.7->1.7  Therapy recommendations:SNF  Disposition:Pending  Hx of cryptogenic strokes  In 08/2017 patient admitted for left sided numbness and headache.  MRI showed right cerebellum, right temporoparietal small infarcts.  EF 55 to 60%,  CT head and neck unremarkable.  LDL 89, A1c 4.8.  TCD bubble study showed trivial PFO.  DVT negative.  TEE no PFO.  Hypercoagulable and autoimmune work-up negative.  Discharged with aspirin and Lipitor.  01/2018 episode of left facial numbness and left leg weakness.  CT head and neck unremarkable.  MRI negative.  06/2018 admitted to [facility] for right hand/face numbness.  MRI showed right tiny parietal infarct.  10/2020 admitted to [facility] for aphasia status post tPA.  CTA neck showed left M2 occlusion.  MRI showed left MCA and punctate right frontal infarct.  TCD bubble study potential trivial PFO, TEE tiny PFO.  11/2020 she was referred to loop recorder which was not done as physician thought she should be candidate for anticoagulation.  Started on Eliquis 5 mg twice daily.  Possible shingles  Left flank skin lesion with blisters   Concerning for shingles  Varicella zoster PCR pending  Now trasnsferred to negative pressure room  Contact and airborne isolation  On valtrex 1g tid for 7 days  WOC on board for dressing  Leukocytosis  Afebrile  WBC 11.7>9.8->10.0->10.9  UA negative  Tachycardia, resolved  Labtealol 100mg  BID  HR 90s  Hyperlipidemia  Home meds:atrovastatin 80mg  and zetia 10mg    Will restart post CT if stable  LDL 48, goal < 70  Resumed, on lipitor 80 and zetia 10  Continue statin at discharge  Dysphagia- DYS diet 1  Cortrack placementfor TF  SLP and RD following  Zero intake x 3 day calorie count on nocturnal feedings alone.   Planning for PEG - Father is and elects to consider PEG placement. Dr. , trauma surgery, consulted and following until anticoagulation status is appropriate for surgical intervention.   RD to restart full tube feeding regimen  Birth control  Recommend OP PCP or OBGYN discontinue Depo - provera due to numerous Contraindications including hx of tobacco use, migraines, and multiple  strokes.  Tobacco abuse  Current smoker  Smoking cessation counselingwill beprovided  Contraindicated for birth control Depo  Hx of pseudotumor cerebri Chronic migraine  Follows with Dr. 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  Other Stroke Risk Factors  MorbidObesity,Body mass index is 42.12 kg/m., BMI >/= 30 associated with increased stroke risk, recommend weight loss, diet and exercise as appropriate   Obstructive sleep apnea   Hospital day # 13   Terrace Arabia, MD PhD Stroke Neurology 04/15/2021 3:12 PM     To contact Stroke Continuity provider, please refer to . After hours, contact General Neurology

## 2021-04-15 NOTE — Progress Notes (Signed)
ANTICOAGULATION CONSULT NOTE  Pharmacy Consult for warfarin > heparin Indication:  Acute stroke  Allergies  Allergen Reactions  . Tape Rash    Cannot tolerate paper tape    Patient Measurements: Height: 5\' 5"  (165.1 cm) Weight: 110.8 kg (244 lb 4.3 oz) IBW/kg (Calculated) : 57  Heparin dosing weight: 72 kg    Vital Signs: Temp: 98.1 F (36.7 C) (05/04 0700) Temp Source: Oral (05/04 0700) BP: 98/62 (05/04 0700) Pulse Rate: 91 (05/04 0329)  Labs: Recent Labs    04/13/21 0428 04/14/21 0151 04/15/21 0435  HGB 10.9*  --  11.6*  HCT 35.2*  --  36.2  PLT 252  --  268  LABPROT 30.7* 28.5* 20.2*  INR 3.0* 2.7* 1.7*  CREATININE 0.62  --  0.64    Estimated Creatinine Clearance: 122.8 mL/min (by C-G formula based on SCr of 0.64 mg/dL).  Assessment: 35 y.o female on  apixaban PTA for hx prior strokes, ?DVT.  Last dose PTA " unknown", admitted 04/02/21 with acute CVA.  No TPA due to Apixaban pta.   -4/21 s/p thrombectomy. -Neurologist consulted pharmacist on 04/08/21 to start warfarin with goal INR 2.5-3.5 per Dr. 04/10/21.  Baseline INR 1.2 on admit 4/21 Hypercoagulable panel:  Homocysteine serum slightly elevated at 15.1, otherwise negative to date.   Plan is to place PEG - warfarin on hold. Pharmacy consulted to transition to IV heparin drip when INR < 2.5  INR today is down to 1.7. H/H low stable. Plt wnl   Goal of Therapy:  INR 2.5-3.5 per Neurologist  Anti-Xa level 0.3-0.5  Monitor platelets by anticoagulation protocol: Yes   Plan:  Start heparin infusion at 1200 units/hr. No bolus  F/u 6 hr HL Monitor daily HL, CBC and s/s of bleeding  F/u plans for PEG placement and resumption of warfarin   Thank you for involving pharmacy in this patient's care.  5/21, PharmD., BCPS, BCCCP Clinical Pharmacist Please refer to [facility] for unit-specific pharmacist

## 2021-04-15 NOTE — Progress Notes (Signed)
Occupational Therapy Treatment Patient Details Name: Krista Maddox MRN: [id number] DOB: 1986/09/15 Today's Date: 04/15/2021    History of present illness The pt is a 35 yo female presenting with L sided facial droop and L sided weakness on 4/21. Underwent angiogram and revascularization on 4/21. 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   OT comments  Goals updated today as they are due 5/6. Pt progressing towards goals, attempting to communicate, interacting with grooming items (toothbrush) assisting with rolling in bed for peri care as able. Pt continues with hemiplegia (PROM at all joints of LUE and ankle) continues to be overall total A for ADL. With increased stimulation and processing time Pt able to participate with max A in grooming. R gaze preference remains, R head turn remains but improved cervical rotation today. OT will continue to follow acutely POC remains appropriate.    Follow Up Recommendations  SNF;Supervision/Assistance - 24 hour    Equipment Recommendations  Other (comment) (defer to next venue)    Recommendations for Other Services      Precautions / Restrictions Precautions Precautions: Fall Precaution Comments: R gaze, L hemi Required Braces or Orthoses: Other Brace Other Brace: L PRAFO Restrictions Weight Bearing Restrictions: No       Mobility Bed Mobility Overal bed mobility: Needs Assistance Bed Mobility: Rolling Rolling: Max assist;+2 for physical assistance;+2 for safety/equipment         General bed mobility comments: Pt lying in bed with bowel movement upon arrival, thus performed multiple rolling bouts L <> R throughout session for pericare and bedding change. Pt not following simple cues to flex knees, roll, or reach but automatically would reach with R hand for bed rail once the roll was initiated by therapists, maxAx2. in Chair position in bed at end of session    Transfers                  General transfer comment: unable    Balance                                           ADL either performed or assessed with clinical judgement   ADL Overall ADL's : Needs assistance/impaired     Grooming: Oral care;Maximal assistance;Bed level (chair position) Grooming Details (indicate cue type and reason): RUE HOH support to bring towards face but resistant to movement; Pt will look at toothbrush and open mouth for care - given time to process, she realizes what is going on and will cooperate. overall total assist             Lower Body Dressing: Total assistance;Bed level Lower Body Dressing Details (indicate cue type and reason): to don socks and new diaper/brief Toilet Transfer: Maximal assistance;+2 for physical assistance;+2 for safety/equipment (Rolling)   Toileting- Clothing Manipulation and Hygiene: Total assistance;Bed level Toileting - Clothing Manipulation Details (indicate cue type and reason): rolling for peri care       General ADL Comments: Pt continues to require significant assist for all aspects of ADL - given time and stimulation Pt does react and cooperate appropriately     Vision   Vision Assessment?: Vision impaired- to be further tested in functional context Additional Comments: R head turn and gaze preference. When head is in midline, R gaze preference continues despite stimuli. When head turned to right, gaze  will track but not to midline.   Perception     Praxis      Cognition Arousal/Alertness: Awake/alert Behavior During Therapy: Flat affect Overall Cognitive Status: Impaired/Different from baseline                                 General Comments: Pt with tendency to look towards R, tracking therapists while on her R intermittently. Pt able to cross midline with eyes when she is performing tasks, like covering herself with a blanket, but would not cross midline to track therapists or attend to  noxious stimuli or cues to look to L. Pt with no response to noxious stimuli at L hand but grimacing noted with noxious stimuli at L foot. Pt not following cues, but demonstrating automatic reactions to grab for bed rail when rolling was initiated by therapists. Pt attempting to verbally communicate this date, stating "hi" a couple times but unable to figure out other words.        Exercises Exercises: General Upper Extremity;General Lower Extremity;Other exercises General Exercises - Upper Extremity Elbow Extension: Left;5 reps;PROM;Supine Wrist Flexion: PROM;Left;5 reps;Supine Wrist Extension: Left;PROM;5 reps;Supine Composite Extension: PROM;Left;5 reps;Supine General Exercises - Lower Extremity Ankle Circles/Pumps: PROM;Supine;Left;Other reps (comment) (x3 reps stretch into dorsiflexion) Other Exercises Other Exercises: PROM into L cervical rotation to tolerance, x3 reps supine with HOB elevated   Shoulder Instructions       General Comments L PRAFO re-donned at end of session    Pertinent Vitals/ Pain       Pain Assessment: Faces Faces Pain Scale: Hurts little more Pain Location: grimacing with noxious stimuli at L foot and with PROM at neck Pain Descriptors / Indicators: Grimacing;Guarding Pain Intervention(s): Limited activity within patient's tolerance;Monitored during session;Repositioned  Home Living                                          Prior Functioning/Environment              Frequency  Min 2X/week        Progress Toward Goals  OT Goals(current goals can now be found in the care plan section)  Progress towards OT goals: Not progressing toward goals - comment (very slight improvement in cognition/attention)  Acute Rehab OT Goals Patient Stated Goal: unable to state OT Goal Formulation: Patient unable to participate in goal setting Time For Goal Achievement: 05/01/21 Potential to Achieve Goals: Fair  Plan Discharge plan remains  appropriate;Frequency remains appropriate    Co-evaluation    PT/OT/SLP Co-Evaluation/Treatment: Yes Reason for Co-Treatment: Necessary to address cognition/behavior during functional activity;For patient/therapist safety;To address functional/ADL transfers PT goals addressed during session: Mobility/safety with mobility;Strengthening/ROM OT goals addressed during session: ADL's and self-care;Strengthening/ROM      AM-PAC OT "6 Clicks" Daily Activity     Outcome Measure   Help from another person eating meals?: Total Help from another person taking care of personal grooming?: Total Help from another person toileting, which includes using toliet, bedpan, or urinal?: Total Help from another person bathing (including washing, rinsing, drying)?: Total Help from another person to put on and taking off regular upper body clothing?: Total Help from another person to put on and taking off regular lower body clothing?: Total 6 Click Score: 6    End of Session    OT Visit Diagnosis:  Other abnormalities of gait and mobility (R26.89);Muscle weakness (generalized) (M62.81);Low vision, both eyes (H54.2);Other symptoms and signs involving cognitive function;Cognitive communication deficit (R41.841);Hemiplegia and hemiparesis Symptoms and signs involving cognitive functions: Cerebral infarction Hemiplegia - Right/Left: Left Hemiplegia - caused by: Cerebral infarction   Activity Tolerance Patient tolerated treatment well   Patient Left in bed;with call bell/phone within reach;with bed alarm set;Other (comment) (SLP in room)   Nurse Communication Mobility status        Time: 9935-7017 OT Time Calculation (min): 38 min  Charges: OT General Charges $OT Visit: 1 Visit OT Treatments $Self Care/Home Management : 8-22 mins  Nyoka Cowden OTR/L Acute Rehabilitation Services Pager: [phone] Office: [phone]   Evern Bio Samrat Hayward 04/15/2021, 2:06 PM

## 2021-04-15 NOTE — Progress Notes (Signed)
Physical Therapy Treatment Patient Details Name: Krista Maddox MRN: [id number] DOB: [birth year] Today's Date: 04/15/2021    History of Present Illness The pt is a 35 yo female presenting with L sided facial droop and L sided weakness on 4/21. Underwent angiogram and revascularization on 4/21. 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    PT Comments    Pt attempting to verbally communicate today, successfully stating "hi" a couple times during session. Pt also tracking personnel on her R side, but not crossing midline to continue tracking personnel or when cued or provided noxious stimuli on her L. Pt did demonstrate spontaneous gaze crossing midline when she was performing tasks for herself, like pulling her blanket over her. Pt unable to follow simple commands, but did demonstrate automatic response to grab for bed rail once rolling was initiated by therapists, maxAx2 to roll in bed. Pt with lack of sensation at her L UE, demonstrating no response to or acknowledgement of noxious stimuli at her L UE this date. However, pt did demonstrate a reaction to noxious stimuli at her L foot. Performed PROM to L UE, L ankle, and neck into L rotation to decrease risk for contractures and encourage midline orientation. Will continue to follow acutely. Current recommendations remain appropriate.   Follow Up Recommendations  SNF;Supervision/Assistance - 24 hour     Equipment Recommendations  None recommended by PT    Recommendations for Other Services       Precautions / Restrictions Precautions Precautions: Fall Precaution Comments: R gaze, L hemi Required Braces or Orthoses: Other Brace Other Brace: L PRAFO Restrictions Weight Bearing Restrictions: No    Mobility  Bed Mobility Overal bed mobility: Needs Assistance Bed Mobility: Rolling Rolling: Max assist;+2 for physical assistance;+2 for safety/equipment         General bed mobility  comments: Pt lying in bed with bowel movement upon arrival, thus performed multiple rolling bouts L <> R throughout session for pericare and bedding change. Pt not following simple cues to flex knees, roll, or reach but automatically would reach with R hand for bed rail once the roll was initiated by therapists, maxAx2.    Transfers                 General transfer comment: unable  Ambulation/Gait             General Gait Details: unable   Stairs             Wheelchair Mobility    Modified Rankin (Stroke Patients Only) Modified Rankin (Stroke Patients Only) Pre-Morbid Rankin Score: Moderate disability Modified Rankin: Severe disability     Balance                                            Cognition Arousal/Alertness: Awake/alert Behavior During Therapy: Flat affect Overall Cognitive Status: Impaired/Different from baseline                                 General Comments: Pt with tendency to look towards R, tracking therapists while on her R intermittently. Pt able to cross midline with eyes when she is performing tasks, like covering herself with a blanket, but would not cross midline to track therapists or attend to noxious stimuli or cues to  look to L. Pt with no response to noxious stimuli at L hand but grimacing noted with noxious stimuli at L foot. Pt not following cues, but demonstrating automatic reactions to grab for bed rail when rolling was initiated by therapists. Pt attempting to verbally communicate this date, stating "hi" a couple times but unable to figure out other words.      Exercises General Exercises - Upper Extremity Elbow Extension: Left;5 reps;PROM;Supine Wrist Flexion: PROM;Left;5 reps;Supine Wrist Extension: Left;PROM;5 reps;Supine Composite Extension: PROM;Left;5 reps;Supine General Exercises - Lower Extremity Ankle Circles/Pumps: PROM;Supine;Left;Other reps (comment) (x3 reps stretch into  dorsiflexion) Other Exercises Other Exercises: PROM into L cervical rotation to tolerance, x3 reps supine with HOB elevated    General Comments General comments (skin integrity, edema, etc.): L PRAFO re-donned at end of session      Pertinent Vitals/Pain Pain Assessment: Faces Faces Pain Scale: Hurts little more Pain Location: grimacing with noxious stimuli at L foot and with PROM at neck Pain Descriptors / Indicators: Grimacing;Guarding Pain Intervention(s): Limited activity within patient's tolerance;Monitored during session;Repositioned    Home Living                      Prior Function            PT Goals (current goals can now be found in the care plan section) Acute Rehab PT Goals Patient Stated Goal: unable to state PT Goal Formulation: With patient Time For Goal Achievement: 04/29/21 Potential to Achieve Goals: Fair Progress towards PT goals: Progressing toward goals    Frequency    Min 2X/week      PT Plan Current plan remains appropriate;Frequency needs to be updated    Co-evaluation PT/OT/SLP Co-Evaluation/Treatment: Yes Reason for Co-Treatment: Necessary to address cognition/behavior during functional activity;For patient/therapist safety;To address functional/ADL transfers PT goals addressed during session: Mobility/safety with mobility;Strengthening/ROM        AM-PAC PT "6 Clicks" Mobility   Outcome Measure  Help needed turning from your back to your side while in a flat bed without using bedrails?: Total Help needed moving from lying on your back to sitting on the side of a flat bed without using bedrails?: Total Help needed moving to and from a bed to a chair (including a wheelchair)?: Total Help needed standing up from a chair using your arms (e.g., wheelchair or bedside chair)?: Total Help needed to walk in hospital room?: Total Help needed climbing 3-5 steps with a railing? : Total 6 Click Score: 6    End of Session   Activity  Tolerance: Patient tolerated treatment well Patient left: in bed;with call bell/phone within reach;with bed alarm set;Other (comment) (with SLP; bed in chair-like position) Nurse Communication: Mobility status;Other (comment) (PRAFO on L foot; bed in chair-like position) PT Visit Diagnosis: Hemiplegia and hemiparesis;Other abnormalities of gait and mobility (R26.89);Difficulty in walking, not elsewhere classified (R26.2);Other symptoms and signs involving the nervous system (R29.898);Muscle weakness (generalized) (M62.81) Hemiplegia - Right/Left: Left Hemiplegia - dominant/non-dominant: Non-dominant Hemiplegia - caused by: Cerebral infarction     Time: 5188-4166 PT Time Calculation (min) (ACUTE ONLY): 44 min  Charges:  $Therapeutic Activity: 23-37 mins                     Raymond Gurney, PT, DPT Acute Rehabilitation Services  Pager: [phone] Office: [phone]    Jewel Baize 04/15/2021, 12:55 PM

## 2021-04-16 ENCOUNTER — Encounter (HOSPITAL_COMMUNITY)
Admission: EM | Disposition: A | Discharge status: Rehabilitation Facility | Payer: Self-pay | Source: Home / Self Care | Attending: Neurology

## 2021-04-16 ENCOUNTER — Encounter (HOSPITAL_COMMUNITY): Payer: Self-pay | Admitting: Neurology

## 2021-04-16 ENCOUNTER — Inpatient Hospital Stay (HOSPITAL_COMMUNITY): Payer: Medicare Other | Admitting: Certified Registered Nurse Anesthetist

## 2021-04-16 DIAGNOSIS — R1312 Dysphagia, oropharyngeal phase: Secondary | ICD-10-CM | POA: Diagnosis not present

## 2021-04-16 DIAGNOSIS — I6601 Occlusion and stenosis of right middle cerebral artery: Secondary | ICD-10-CM | POA: Diagnosis not present

## 2021-04-16 DIAGNOSIS — I63511 Cerebral infarction due to unspecified occlusion or stenosis of right middle cerebral artery: Secondary | ICD-10-CM | POA: Diagnosis not present

## 2021-04-16 HISTORY — PX: ESOPHAGOGASTRODUODENOSCOPY (EGD) WITH PROPOFOL: SHX5813

## 2021-04-16 HISTORY — PX: PEG PLACEMENT: SHX5437

## 2021-04-16 LAB — GLUCOSE, CAPILLARY
Glucose-Capillary: 93 mg/dL (ref 70–99)
Glucose-Capillary: 84 mg/dL (ref 70–99)
Glucose-Capillary: 71 mg/dL (ref 70–99)
Glucose-Capillary: 70 mg/dL (ref 70–99)
Glucose-Capillary: 91 mg/dL (ref 70–99)

## 2021-04-16 LAB — CBC
HCT: 35.4 % — ABNORMAL LOW (ref 36.0–46.0)
Hemoglobin: 11.5 g/dL — ABNORMAL LOW (ref 12.0–15.0)
MCH: 29.4 pg (ref 26.0–34.0)
MCHC: 32.5 g/dL (ref 30.0–36.0)
MCV: 90.5 fL (ref 80.0–100.0)
Platelets: 272 10*3/uL (ref 150–400)
RBC: 3.91 MIL/uL (ref 3.87–5.11)
RDW: 14.9 % (ref 11.5–15.5)
WBC: 10 10*3/uL (ref 4.0–10.5)
nRBC: 0 % (ref 0.0–0.2)

## 2021-04-16 LAB — HEPARIN LEVEL (UNFRACTIONATED): Heparin Unfractionated: 0.66 IU/mL (ref 0.30–0.70)

## 2021-04-16 SURGERY — ESOPHAGOGASTRODUODENOSCOPY (EGD) WITH PROPOFOL
Anesthesia: Monitor Anesthesia Care

## 2021-04-16 MED ORDER — PHENYLEPHRINE HCL (PRESSORS) 10 MG/ML IV SOLN
INTRAVENOUS | Status: DC | PRN
  Administered 2021-04-16: 120 ug via INTRAVENOUS
  Administered 2021-04-16: 80 ug via INTRAVENOUS

## 2021-04-16 MED ORDER — HEPARIN (PORCINE) 25000 UT/250ML-% IV SOLN
1350.0000 [IU]/h | INTRAVENOUS | Status: DC
  Administered 2021-04-16 – 2021-04-17 (×2): 1100 [IU]/h via INTRAVENOUS
  Administered 2021-04-18 – 2021-04-19 (×2): 1350 [IU]/h via INTRAVENOUS
  Filled 2021-04-16 (×5): qty 250

## 2021-04-16 MED ORDER — PROPOFOL 500 MG/50ML IV EMUL
INTRAVENOUS | Status: DC | PRN
  Administered 2021-04-16: 50 ug/kg/min via INTRAVENOUS

## 2021-04-16 MED ORDER — LACTATED RINGERS IV SOLN: INTRAVENOUS | Status: DC | PRN

## 2021-04-16 MED ORDER — PROPOFOL 10 MG/ML IV BOLUS
INTRAVENOUS | Status: DC | PRN
  Administered 2021-04-16 (×2): 20 mg via INTRAVENOUS
  Administered 2021-04-16: 25 mg via INTRAVENOUS

## 2021-04-16 NOTE — Anesthesia Procedure Notes (Signed)
Procedure Name: MAC Date/Time: 04/16/2021 3:28 PM Performed by: Oletta Lamas, CRNA Pre-anesthesia Checklist: Patient identified, Emergency Drugs available, Suction available, Patient being monitored and Timeout performed Oxygen Delivery Method: Nasal cannula

## 2021-04-16 NOTE — Anesthesia Preprocedure Evaluation (Addendum)
Anesthesia Evaluation  Patient identified by MRN, date of birth, ID band Patient awake and Patient confused    Reviewed: Allergy & Precautions, NPO status , Patient's Chart, lab work & pertinent test results  Airway   TM Distance: <3 FB   Mouth opening: Limited Mouth Opening Comment: Unable to cooperate with exam Dental   Pulmonary former smoker,    breath sounds clear to auscultation       Cardiovascular Pt. on home beta blockers  Rhythm:regular Rate:Normal  ECHO 4/22 Left Ventricle: Left ventricular ejection fraction, by estimation, is 60 to 65%. The left ventricle has normal function. The left ventricle has no regional wall motion abnormalities.   Neuro/Psych  Headaches, Recurrent cryptogenic embolic strokes -Acute infarct of the R MCA due to right M1 occlusion s/p IR with TICI2c,cryptogenic etiology  Hx of pseudotumor cerebri Chronic migraine  Neuromuscular disease CVA, Residual Symptoms negative psych ROS   GI/Hepatic   Endo/Other    Renal/GU      Musculoskeletal  (+) Arthritis ,   Abdominal   Peds negative pediatric ROS (+)  Hematology  (+) Blood dyscrasia, anemia ,   Anesthesia Other Findings Krista Maddox is a 35 y.o. female with history of multiple strokes who presented as a code stroke with symptoms suggestive of LVO. tPA was not given due to Eliquis. CTA head and neck revealed an occlusion of distal right M2 MCA with partial reconstitution and subsequent reocclusion of most M2 branches. Patient underwent thrombectomy  Reproductive/Obstetrics                          Anesthesia Physical  Anesthesia Plan  ASA: III  Anesthesia Plan: MAC   Post-op Pain Management:    Induction: Intravenous  PONV Risk Score and Plan: 3 and Ondansetron, Dexamethasone and Treatment may vary due to age or medical condition  Airway Management Planned: Mask, Nasal Cannula, Natural Airway  and Simple Face Mask  Additional Equipment: None  Intra-op Plan:   Post-operative Plan:   Informed Consent: I have reviewed the patients History and Physical, chart, labs and discussed the procedure including the risks, benefits and alternatives for the proposed anesthesia with the patient or authorized representative who has indicated his/her understanding and acceptance.       Plan Discussed with: CRNA, Anesthesiologist and Surgeon  Anesthesia Plan Comments:        Anesthesia Quick Evaluation

## 2021-04-16 NOTE — Progress Notes (Signed)
ANTICOAGULATION CONSULT NOTE  Pharmacy Consult:  Heparin Indication:  Acute stroke  Allergies  Allergen Reactions  . Tape Rash    Cannot tolerate paper tape    Patient Measurements: Height: 5\' 5"  (165.1 cm) Weight: 110.2 kg (242 lb 15.2 oz) IBW/kg (Calculated) : 57  Heparin dosing weight: 83 kg   Vital Signs: Temp: 98.2 F (36.8 C) (05/05 1559) Temp Source: Axillary (05/05 1559) BP: 97/57 (05/05 1613) Pulse Rate: 93 (05/05 1613)  Labs: Recent Labs    04/14/21 0151 04/15/21 0435 04/15/21 1822 04/16/21 0158  HGB  --  11.6*  --  11.5*  HCT  --  36.2  --  35.4*  PLT  --  268  --  272  LABPROT 28.5* 20.2*  --   --   INR 2.7* 1.7*  --   --   HEPARINUNFRC  --   --  0.29* 0.66  CREATININE  --  0.64  --   --     Estimated Creatinine Clearance: 122.5 mL/min (by C-G formula based on SCr of 0.64 mg/dL).  Assessment: 34 YOF on apixaban PTA for history of prior strokes and questionable DVT, admitted 04/02/21 with acute CVA.  She is s/p thrombectomy on 04/02/21.  Patient was transitioned to Coumadin, which is then held for procedure.    Patient is s/p PEG today 5/5 and Pharmacy consulted to resume IV heparin in 6 hours with no bolus.  Goal of Therapy:  Heparin level: 0.3-0.5 units/ml with acute CVA Monitor platelets by anticoagulation protocol: Yes   Plan:  At 2200, resume heparin gtt at 1100 units/hr F/U AM labs  Krista Maddox D. 04/04/21, PharmD, BCPS, BCCCP 04/16/2021, 4:36 PM

## 2021-04-16 NOTE — Op Note (Signed)
[facility] Patient Name: Krista Maddox Procedure Date : 04/16/2021 MRN: [id number] Attending MD: Georganna Skeans , MD Date of Birth: 1986/10/15 CSN: [id number] Age: 35 Admit Type: Inpatient Procedure:                Upper GI endoscopy Indications:              Place PEG because patient is unable to eat due to                            stroke (CVA) Providers:                Georganna Skeans, MD, Saverio Danker, PA-C, Clyde Lundborg, RN, Dulcy Fanny, Janee Morn,                            Technician Referring MD:             Erlinda Hong Medicines:                Monitored Anesthesia Care Complications:             Estimated Blood Loss:     Estimated blood loss was minimal. Procedure:                Pre-Anesthesia Assessment:                           - Prior to the procedure, a History and Physical                            was performed, and patient medications and                            allergies were reviewed. The patient is unable to                            give consent secondary to the patient's altered                            mental status. The risks and benefits of the                            procedure and the sedation options and risks were                            discussed with the patient's father. All questions                            were answered and informed consent was obtained.                            Patient identification and proposed procedure were                            verified  by the physician, the nurse, the                            anesthetist and the technician in the endoscopy                            suite. Mental Status Examination: lethargic. ASA                            Grade Assessment: III - A patient with severe                            systemic disease. After reviewing the risks and                            benefits, the patient was deemed in satisfactory                             condition to undergo the procedure. The anesthesia                            plan was to use monitored anesthesia care (MAC).                            Immediately prior to administration of medications,                            the patient was re-assessed for adequacy to receive                            sedatives. The heart rate, respiratory rate, oxygen                            saturations, blood pressure, adequacy of pulmonary                            ventilation, and response to care were monitored                            throughout the procedure. The physical status of                            the patient was re-assessed after the procedure.                           After obtaining informed consent, the endoscope was                            passed under direct vision. Throughout the                            procedure, the patient's blood pressure, pulse, and  oxygen saturations were monitored continuously. The                            [id number]-H190 ([id number] Olympus gastroscope was                            introduced through the mouth, and advanced to the                            duodenal bulb. The upper GI endoscopy was somewhat                            difficult due to unusual anatomy. The patient                            tolerated the procedure well. Scope In: Scope Out: Findings:      No gross lesions were noted in the esophagus.      No gross lesions were noted in the stomach. Placement of an externally       removable PEG with no T-fasteners was successfully completed. The       external bumper was at the 4.0 cm marking on the tube.      No gross lesions were noted in the duodenal bulb. Impression:               - No gross lesions in esophagus.                           - No gross lesions in the stomach.                           - No gross lesions in the duodenal bulb.                           - An externally  removable PEG placement was                            successfully completed.                           - No specimens collected. Recommendation:           - Please follow the post-PEG recommendations. Procedure Code(s):        --- Professional ---                           [phone], Esophagogastroduodenoscopy, flexible,                            transoral; with directed placement of percutaneous                            gastrostomy tube Diagnosis Code(s):        --- Professional ---                           R44.315, Other sequelae of cerebral infarction  R63.3, Feeding difficulties                           Z43.1, Encounter for attention to gastrostomy CPT copyright 2019 [employer]. All rights reserved. The codes documented in this report are preliminary and upon coder review may  be revised to meet current compliance requirements. Georganna Skeans, MD 04/16/2021 4:16:56 PM This report has been signed electronically. Number of Addenda: 0

## 2021-04-16 NOTE — Progress Notes (Signed)
STROKE TEAM PROGRESS NOTE   INTERVAL HISTORY No family is at the bedside. Pt lying in bed, eyes open, still has aphasia and left hemiplegia. However, there is increased tone at LUE and LLE. Trauma team Dr. Janee Morn will do PEG today.   Vitals:   04/16/21 0410 04/16/21 0415 04/16/21 0700 04/16/21 1100  BP: 127/89  125/83 119/69  Pulse: 78     Resp: (!) 23     Temp: 99 F (37.2 C)  98.3 F (36.8 C) 99.4 F (37.4 C)  TempSrc: Axillary  Axillary Axillary  SpO2: 100%     Weight:  110.2 kg    Height:       CBC:  Recent Labs  Lab 04/10/21 0837 04/11/21 0241 04/12/21 0243 04/15/21 0435 04/16/21 0158  WBC 9.8 10.0   < > 10.9* 10.0  NEUTROABS 6.5 6.7  --   --   --   HGB 11.4* 11.1*   < > 11.6* 11.5*  HCT 36.2 34.7*   < > 36.2 35.4*  MCV 92.3 90.4   < > 89.8 90.5  PLT 213 233   < > 268 272   < > = values in this interval not displayed.   Basic Metabolic Panel:  Recent Labs  Lab 04/13/21 0428 04/15/21 0435  NA 140 139  K 3.7 3.8  CL 107 110  CO2 23 22  GLUCOSE 117* 112*  BUN 22* 20  CREATININE 0.62 0.64  CALCIUM 8.9 8.8*   IMAGING past 24 hours No results found.  PHYSICAL EXAM  Temp:  [98.3 F (36.8 C)-99.6 F (37.6 C)] 99.4 F (37.4 C) (05/05 1100) Pulse Rate:  [76-95] 78 (05/05 0410) Resp:  [15-23] 23 (05/05 0410) BP: (107-127)/(65-89) 119/69 (05/05 1100) SpO2:  [96 %-100 %] 100 % (05/05 0410) Weight:  [110.2 kg] 110.2 kg (05/05 0415)  General - Well nourished, well developed, in no apparent distress.  Resp: No extra work of breathing.  Cardiovascular - Regular rhythm and rate. Neuro - awake alert eyes open, nonverbal, not following commands. Right gaze preference, barely cross midline.   Blinking to visual threat on the right but not on the left.  Left facial droop.  Left upper extremity flaccid, left LE knee flexion with pain at left foot.  Right upper extremity spontaneous movement against gravity.  Right lower extremity at least 3/5 spontaneously.  Increased muscle tone at LUE and LLE. Sensation, coordination and gait not able to be tested.   ASSESSMENT/PLAN Krista L Matthewsis a 35 y.o.femalewith history of multiple strokes who presented as a code stroke with symptoms suggestive of LVO. tPA was not given due to Eliquis. CTA head and neck revealed an occlusion of distal right M2 MCA with partial reconstitution and subsequent reocclusion of most M2 branches. Patient underwent thrombectomy.   Recurrent cryptogenic embolic strokes -Acute infarct of the R MCA due to right M1 occlusion s/p IR with TICI2c,cryptogenic etiology  Code Stroke:CT head No acuteintracranial hemorrhage.Acute infarcts in the right MCAterritory.ASPECTS8.   CTA head & neckOcclusion of distal right M1 MCA with partial reconstitution and subsequent reocclusion of most M2 branches. Poor collaterals.  CTPerfusion imaging demonstrates core infarction 99 mL and penumbra of 34 mL. The extent of core infarction is calculated at greater than apparent loss of gray-white differentiation on noncontrast head CT. Therefore, penumbra may be greater.  2D EchoLVEF 60-65%, no structural abnormalities noted that could increase risk for CVA  Repeat CT Head 4/24-large left MCA infarct, no midline shift. Small SAH sylvian  fissure, diminishing in size  Hypercoagulable panel: Homocysteine 15.1 due to smoking, otherwise negative to date  LDL48  HgbA1c5.7  VTE prophylaxis -SCDs+ Lovenox  aspirin 325 mg daily and Eliquis (apixaban) dailyprior to admission, was on ASA 325 with warfarin (Goal INR 2.5-3.5) started on 4/27 and last given 5/1.  Warfarin now on hold and heparin IV now for PEG placement. Transition back to coumadin after PEG  PT-INR 1.0->1.2->1.5->2.2->3.0->2.7->1.7  Therapy recommendations:SNF  Disposition:Pending  Hx of cryptogenic strokes  In 08/2017 patient admitted for left sided numbness and headache.  MRI showed right cerebellum, right  temporoparietal small infarcts.  EF 55 to 60%, CT head and neck unremarkable.  LDL 89, A1c 4.8.  TCD bubble study showed trivial PFO.  DVT negative.  TEE no PFO.  Hypercoagulable and autoimmune work-up negative.  Discharged with aspirin and Lipitor.  01/2018 episode of left facial numbness and left leg weakness.  CT head and neck unremarkable.  MRI negative.  06/2018 admitted to [facility] for right hand/face numbness.  MRI showed right tiny parietal infarct.  10/2020 admitted to [facility] for aphasia status post tPA.  CTA neck showed left M2 occlusion.  MRI showed left MCA and punctate right frontal infarct.  TCD bubble study potential trivial PFO, TEE tiny PFO.  11/2020 she was referred to loop recorder which was not done as physician thought she should be candidate for anticoagulation.  Started on Eliquis 5 mg twice daily.  Possible shingles  Left flank skin lesion with blisters   Concerning for shingles  Varicella zoster PCR pending  Now trasnsferred to negative pressure room  Contact and airborne isolation  On valtrex 1g tid for 7 days  WOC on board for dressing  Leukocytosis, resolved  Afebrile  WBC 11.7>9.8->10.0->10.9->10.0  UA negative  Tachycardia, resolved  Labtealol 100mg  BID  HR 90s  Hyperlipidemia  Home meds:atrovastatin 80mg  and zetia 10mg    Will restart post CT if stable  LDL 48, goal < 70  Resumed, on lipitor 80 and zetia 10  Continue statin at discharge  Dysphagia- DYS diet 1  Cortrack placementfor TF  SLP and RD following  Zero intake x 3 day calorie count on nocturnal feedings alone.   Restarted full tube feeding regimen  PEG today with Dr. , trauma surgery  Birth control  Recommend OP PCP or OBGYN discontinue Depo - provera due to numerous Contraindications including hx of tobacco use, migraines, and multiple strokes.  Tobacco abuse  Current smoker  Smoking cessation counselingwill beprovided  Contraindicated for  birth control Depo  Hx of pseudotumor cerebri Chronic migraine  Follows with Dr. 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  Other Stroke Risk Factors  MorbidObesity,Body mass index is 42.12 kg/m., BMI >/= 30 associated with increased stroke risk, recommend weight loss, diet and exercise as appropriate   Obstructive sleep apnea  Hospital day # 14  Terrace Arabia, MD PhD Stroke Neurology 04/16/2021 2:09 PM    To contact Stroke Continuity provider, please refer to . After hours, contact General Neurolog

## 2021-04-16 NOTE — Progress Notes (Signed)
ANTICOAGULATION CONSULT NOTE  Pharmacy Consult for Heparin Indication:  Acute stroke  Allergies  Allergen Reactions  . Tape Rash    Cannot tolerate paper tape    Patient Measurements: Height: 5\' 5"  (165.1 cm) Weight: 110.8 kg (244 lb 4.3 oz) IBW/kg (Calculated) : 57  Heparin dosing weight: 83 kg    Vital Signs: Temp: 98.7 F (37.1 C) (05/05 0025) Temp Source: Axillary (05/05 0025) BP: 116/71 (05/05 0025) Pulse Rate: 76 (05/05 0025)  Labs: Recent Labs    04/13/21 0428 04/14/21 0151 04/15/21 0435 04/15/21 1822 04/16/21 0158  HGB 10.9*  --  11.6*  --  11.5*  HCT 35.2*  --  36.2  --  35.4*  PLT 252  --  268  --  272  LABPROT 30.7* 28.5* 20.2*  --   --   INR 3.0* 2.7* 1.7*  --   --   HEPARINUNFRC  --   --   --  0.29* 0.66  CREATININE 0.62  --  0.64  --   --     Estimated Creatinine Clearance: 122.8 mL/min (by C-G formula based on SCr of 0.64 mg/dL).  Assessment: 35 y.o. female with h/o DVT and recent CVA, Coumadin on hold for PEG placement, for heparin  Goal of Therapy:  INR:  2.5-3.5 per Neurologist  Heparin level: 0.3-0.5 units/ml Monitor platelets by anticoagulation protocol: Yes   Plan:  Decrease Heparin 1100 units/hr Check heparin level in 8 hours.   20, PharmD, BCPS

## 2021-04-16 NOTE — Transfer of Care (Signed)
Immediate Anesthesia Transfer of Care Note  Patient: Krista Maddox  Procedure(s) Performed: ESOPHAGOGASTRODUODENOSCOPY (EGD) WITH PROPOFOL (N/A ) PERCUTANEOUS ENDOSCOPIC GASTROSTOMY (PEG) PLACEMENT (N/A )  Patient Location: Recovered in endoscopy suit due to airborne precautions   Anesthesia Type:General  Level of Consciousness: Return to baseline.  None-verbal.  Awake looking around.    Airway & Oxygen Therapy: Patient Spontanous Breathing and Patient connected to nasal cannula oxygen  Post-op Assessment: Report given to RN and Post -op Vital signs reviewed and stable  Post vital signs: Reviewed and stable  Last Vitals:  Vitals Value Taken Time  BP 106/67   Temp    Pulse 102   Resp 15   SpO2 100     Last Pain:  Vitals:   04/16/21 1521  TempSrc: Axillary  PainSc:       Patients Stated Pain Goal: 0 (04/09/21 2037)  Complications: No complications documented.

## 2021-04-16 NOTE — Progress Notes (Signed)
14 Days Post-Op   Subjective: Eyes open, non-verbal  ROS negative except as listed above. Objective: Vital signs in last 24 hours: Temp:  [97.9 F (36.6 C)-99.6 F (37.6 C)] 99 F (37.2 C) (05/05 0410) Pulse Rate:  [76-95] 78 (05/05 0410) Resp:  [15-23] 23 (05/05 0410) BP: (107-127)/(65-89) 127/89 (05/05 0410) SpO2:  [96 %-100 %] 100 % (05/05 0410) Weight:  [110.2 kg] 110.2 kg (05/05 0415) Last BM Date: 04/15/21  Intake/Output from previous day: 05/04 0701 - 05/05 0700 In: 305.7 [P.O.:120; I.V.:185.7] Out: 200 [Urine:200] Intake/Output this shift: No intake/output data recorded.  General appearance: no distress Resp: clear to auscultation bilaterally Cardio: regular rate and rhythm GI: soft, NT  Lab Results: CBC  Recent Labs    04/15/21 0435 04/16/21 0158  WBC 10.9* 10.0  HGB 11.6* 11.5*  HCT 36.2 35.4*  PLT 268 272   BMET Recent Labs    04/15/21 0435  NA 139  K 3.8  CL 110  CO2 22  GLUCOSE 112*  BUN 20  CREATININE 0.64  CALCIUM 8.8*   PT/INR Recent Labs    04/14/21 0151 04/15/21 0435  LABPROT 28.5* 20.2*  INR 2.7* 1.7*   ABG No results for input(s): PHART, HCO3 in the last 72 hours.  Invalid input(s): PCO2, PO2  Studies/Results: No results found.  Anti-infectives: Anti-infectives (From admission, onward)   Start     Dose/Rate Route Frequency Ordered Stop   04/12/21 1600  valACYclovir (VALTREX) tablet 1,000 mg        1,000 mg Per Tube 3 times daily 04/12/21 1007 04/18/21 2159   04/11/21 2200  valACYclovir (VALTREX) tablet 1,000 mg  Status:  Discontinued        1,000 mg Oral 3 times daily 04/11/21 1940 04/12/21 1007      Assessment/Plan: Multiple cryptogenic CVAs with need for enteral access - for PEG today. Procedure, risks, and benefits have been D/W her father and consent done. Hold heparin now. Procedure at 1300.  LOS: 14 days    Violeta Gelinas, MD, MPH, FACS Trauma & General Surgery Use [email or web address] to contact on call  provider  5/5/2022Patient ID: Krista Maddox, female   DOB: [birth year], 35 y.o.   MRN: [id number]

## 2021-04-17 ENCOUNTER — Encounter (HOSPITAL_COMMUNITY): Payer: Self-pay | Admitting: General Surgery

## 2021-04-17 DIAGNOSIS — I6340 Cerebral infarction due to embolism of unspecified cerebral artery: Secondary | ICD-10-CM | POA: Diagnosis not present

## 2021-04-17 DIAGNOSIS — Z8673 Personal history of transient ischemic attack (TIA), and cerebral infarction without residual deficits: Secondary | ICD-10-CM | POA: Diagnosis not present

## 2021-04-17 DIAGNOSIS — I63511 Cerebral infarction due to unspecified occlusion or stenosis of right middle cerebral artery: Secondary | ICD-10-CM | POA: Diagnosis not present

## 2021-04-17 DIAGNOSIS — I6601 Occlusion and stenosis of right middle cerebral artery: Secondary | ICD-10-CM | POA: Diagnosis not present

## 2021-04-17 LAB — GLUCOSE, CAPILLARY
Glucose-Capillary: 107 mg/dL — ABNORMAL HIGH (ref 70–99)
Glucose-Capillary: 117 mg/dL — ABNORMAL HIGH (ref 70–99)
Glucose-Capillary: 119 mg/dL — ABNORMAL HIGH (ref 70–99)
Glucose-Capillary: 120 mg/dL — ABNORMAL HIGH (ref 70–99)
Glucose-Capillary: 135 mg/dL — ABNORMAL HIGH (ref 70–99)
Glucose-Capillary: 145 mg/dL — ABNORMAL HIGH (ref 70–99)

## 2021-04-17 LAB — BASIC METABOLIC PANEL WITH GFR
Anion gap: 7 (ref 5–15)
BUN: 21 mg/dL — ABNORMAL HIGH (ref 6–20)
CO2: 21 mmol/L — ABNORMAL LOW (ref 22–32)
Calcium: 9.1 mg/dL (ref 8.9–10.3)
Chloride: 108 mmol/L (ref 98–111)
Creatinine, Ser: 0.76 mg/dL (ref 0.44–1.00)
GFR, Estimated: >60 mL/min
Glucose, Bld: 137 mg/dL — ABNORMAL HIGH (ref 70–99)
Potassium: 3.6 mmol/L (ref 3.5–5.1)
Sodium: 136 mmol/L (ref 135–145)

## 2021-04-17 LAB — CBC
HCT: 36.2 % (ref 36.0–46.0)
Hemoglobin: 11.4 g/dL — ABNORMAL LOW (ref 12.0–15.0)
MCH: 29 pg (ref 26.0–34.0)
MCHC: 31.5 g/dL (ref 30.0–36.0)
MCV: 92.1 fL (ref 80.0–100.0)
Platelets: 281 10*3/uL (ref 150–400)
RBC: 3.93 MIL/uL (ref 3.87–5.11)
RDW: 14.9 % (ref 11.5–15.5)
WBC: 12.8 10*3/uL — ABNORMAL HIGH (ref 4.0–10.5)
nRBC: 0 % (ref 0.0–0.2)

## 2021-04-17 LAB — PROTIME-INR
INR: 1.4 — ABNORMAL HIGH (ref 0.8–1.2)
Prothrombin Time: 17 s — ABNORMAL HIGH (ref 11.4–15.2)

## 2021-04-17 LAB — HEPARIN LEVEL (UNFRACTIONATED): Heparin Unfractionated: 0.32 [IU]/mL (ref 0.30–0.70)

## 2021-04-17 LAB — VARICELLA-ZOSTER BY PCR: Varicella-Zoster, PCR: NEGATIVE

## 2021-04-17 MED ORDER — WARFARIN SODIUM 7.5 MG PO TABS
7.5000 mg | ORAL_TABLET | Freq: Once | ORAL | Status: AC
  Administered 2021-04-17: 7.5 mg via ORAL
  Filled 2021-04-17: qty 1

## 2021-04-17 MED ORDER — WARFARIN - PHARMACIST DOSING INPATIENT
Freq: Every day | Status: DC
  Filled 2021-04-17: qty 1

## 2021-04-17 NOTE — Progress Notes (Addendum)
ANTICOAGULATION CONSULT NOTE  Pharmacy Consult for heparin > warfarin Indication:  Acute stroke  Allergies  Allergen Reactions  . Tape Rash    Cannot tolerate paper tape    Patient Measurements: Height: 5\' 5"  (165.1 cm) Weight: 109.2 kg (240 lb 11.9 oz) IBW/kg (Calculated) : 57  Heparin dosing weight: 72 kg    Vital Signs: Temp: 98.9 F (37.2 C) (05/06 2000) Temp Source: Axillary (05/06 2000) BP: 123/63 (05/06 2000)  Labs: Recent Labs    04/15/21 0435 04/15/21 1822 04/16/21 0158 04/17/21 0620  HGB 11.6*  --  11.5* 11.4*  HCT 36.2  --  35.4* 36.2  PLT 268  --  272 281  LABPROT 20.2*  --   --  17.0*  INR 1.7*  --   --  1.4*  HEPARINUNFRC  --  0.29* 0.66 0.32  CREATININE 0.64  --   --  0.76    Estimated Creatinine Clearance: 121.9 mL/min (by C-G formula based on SCr of 0.76 mg/dL).  Assessment: 35 y.o female on apixaban PTA for hx prior strokes, ?DVT.  Last dose PTA " unknown," admitted 04/02/21 with acute CVA.  No TPA due to Apixaban pta.   -4/21 s/p thrombectomy. -Neurologist consulted pharmacist on 04/08/21 to start warfarin with goal INR 2.5-3.5 per Dr. 04/10/21.  Baseline INR 1.2 on admit 4/21 Hypercoagulable panel:  Homocysteine serum slightly elevated at 15.1, otherwise negative to date.   Received orders to restart warfarin tonight for INR goal of 2.5-3.5. INR today is 1.4.   Goal of Therapy:  INR 2.5-3.5 per Neurologist  Anti-Xa level 0.3-0.5  Monitor platelets by anticoagulation protocol: Yes   Plan:  Continue heparin infusion at 1100 units/hr Monitor daily heparin level and CBC, s/sx bleeding Warfarin 7.5mg  tonight  5/21 PharmD., BCPS Clinical Pharmacist 04/17/2021 9:32 PM

## 2021-04-17 NOTE — Progress Notes (Addendum)
STROKE TEAM PROGRESS NOTE   INTERVAL HISTORY No family is at the bedside. 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 Spontaneously moving right hemibody and resists exam on the right. +attempt to initiate speech today but grunting sound only.  S/p PEG yestrday. Site with minimal old dried serosang dge, no swelling or active drainage.  Vitals:   04/17/21 0040 04/17/21 0322 04/17/21 0500 04/17/21 0832  BP: 109/77 120/79  132/82  Pulse: 94 98  (!) 119  Resp: 20 20  18   Temp: 98.8 F (37.1 C) 99 F (37.2 C)  98.9 F (37.2 C)  TempSrc: Oral Oral  Oral  SpO2: 99% 98%  99%  Weight:   109.2 kg   Height:       CBC:  Recent Labs  Lab 04/11/21 0241 04/12/21 0243 04/16/21 0158 04/17/21 0620  WBC 10.0   < > 10.0 12.8*  NEUTROABS 6.7  --   --   --   HGB 11.1*   < > 11.5* 11.4*  HCT 34.7*   < > 35.4* 36.2  MCV 90.4   < > 90.5 92.1  PLT 233   < > 272 281   < > = values in this interval not displayed.   Basic Metabolic Panel:  Recent Labs  Lab 04/15/21 0435 04/17/21 0620  NA 139 136  K 3.8 3.6  CL 110 108  CO2 22 21*  GLUCOSE 112* 137*  BUN 20 21*  CREATININE 0.64 0.76  CALCIUM 8.8* 9.1   IMAGING past 24 hours No results found.  PHYSICAL EXAM  Temp:  [97.9 F (36.6 C)-100.2 F (37.9 C)] 98.9 F (37.2 C) (05/06 0832) Pulse Rate:  [93-119] 119 (05/06 0832) Resp:  [14-21] 18 (05/06 0832) BP: (91-137)/(57-126) 132/82 (05/06 0832) SpO2:  [98 %-100 %] 99 % (05/06 0832) Weight:  [109.2 kg] 109.2 kg (05/06 0500)  General - Well nourished, well developed, in no apparent distress.  Resp: No extra work of breathing.  Cardiovascular - Regular rhythm and rate. Neuro - Awake alert eyes open, nonverbal, not following commands. Right gaze preference, barely cross midline.  Blinking to visual threat on the right but not on the left.  Left facial droop.  Left upper extremity flaccid, left LE knee flexion with pain at left foot.  Hypohophonic vocalization with grunt which seemed to be attempt at speech. Right upper extremity spontaneous movement against gravity.  Right lower extremity at least 3/5 spontaneously. Increased muscle tone at LUE and LLE. Sensation, coordination and gait not able to be tested.   ASSESSMENT/PLAN Krista L Matthewsis a 35 y.o.femalewith history of multiple strokes who presented as a code stroke with symptoms suggestive of LVO. tPA was not given due to Eliquis. CTA head and neck revealed an occlusion of distal right M2 MCA with partial reconstitution and subsequent reocclusion of most M2 branches. Patient underwent thrombectomy.   Recurrent cryptogenic embolic strokes -Acute infarct of the R MCA due to right M1 occlusion s/p IR with TICI2c,cryptogenic etiology  Code Stroke:CT head No acuteintracranial hemorrhage.Acute infarcts in the right MCAterritory.ASPECTS8.   CTA head & neckOcclusion of distal right M1 MCA with partial reconstitution and subsequent reocclusion of most M2 branches. Poor collaterals.  CTPerfusion imaging demonstrates core infarction 99 mL and penumbra of 34 mL. The extent of core infarction is calculated at greater than apparent loss of gray-white differentiation on noncontrast head CT. Therefore, penumbra may be greater.  2D EchoLVEF 60-65%, no structural abnormalities  noted that could increase risk for CVA  Repeat CT Head 4/24-large left MCA infarct, no midline shift. Small SAH sylvian fissure, diminishing in size  Hypercoagulable panel: Homocysteine 15.1 due to smoking, otherwise negative to date  LDL48  HgbA1c5.7  VTE prophylaxis -SCDs+ Lovenox  aspirin 325 mg daily and Eliquis (apixaban) dailyprior to admission, now on ASA 325 with heparin IV for PEG placement. Now transition back to coumadin with INR goal 2.5-3.5  PT-INR 1.0->1.2->1.5->2.2->3.0->2.7->1.7  Therapy recommendations:SNF  Disposition:Pending  Hx of cryptogenic  strokes  In 08/2017 patient admitted for left sided numbness and headache.  MRI showed right cerebellum, right temporoparietal small infarcts.  EF 55 to 60%, CT head and neck unremarkable.  LDL 89, A1c 4.8.  TCD bubble study showed trivial PFO.  DVT negative.  TEE no PFO.  Hypercoagulable and autoimmune work-up negative.  Discharged with aspirin and Lipitor.  01/2018 episode of left facial numbness and left leg weakness.  CT head and neck unremarkable.  MRI negative.  06/2018 admitted to [facility] for right hand/face numbness.  MRI showed right tiny parietal infarct.  10/2020 admitted to [facility] for aphasia status post tPA.  CTA neck showed left M2 occlusion.  MRI showed left MCA and punctate right frontal infarct.  TCD bubble study potential trivial PFO, TEE tiny PFO.  11/2020 she was referred to loop recorder which was not done as physician thought she should be candidate for anticoagulation.  Started on Eliquis 5 mg twice daily.  Possible shingles Negative PCR  Left flank skin lesion with blisters   Concerning for shingles  Varicella zoster PCR negative, spoke with IC RN and precautions discontinued 5/6.   Now trasnsferred to negative pressure room  Contact and airborne isolation  On valtrex discontinued  WOC on board for dressing  Leukocytosis, resolved  Low grade ?postop fever 100 5/5 overnight with slight increase in WBC to 12, monitor   WBC 11.7>9.8->10.0->10.9->10.0->12  UA negative  Tachycardia, resolved  Labtealol 100mg  BID  HR 90s  Hyperlipidemia  Home meds:atrovastatin 80mg  and zetia 10mg    Will restart post CT if stable  LDL 48, goal < 70  Resumed, on lipitor 80 and zetia 10  Continue statin at discharge  Dysphagia- DYS diet 1  Cortrack placementfor TF  SLP and RD following  Zero intake x 3 day calorie count on nocturnal feedings alone.   Restarted full tube feeding regimen  S/p PEG POD1: care per Dr. , need properly fitting abd  binder which has been ordered and discussed with RN  Birth control  Recommend OP PCP or OBGYN discontinue Depo - provera due to numerous Contraindications including hx of tobacco use, migraines, and multiple strokes.  Tobacco abuse  Current smoker  Smoking cessation counselingwill beprovided  Contraindicated for birth control Depo  Hx of pseudotumor cerebri Chronic migraine  Follows with Dr. 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  Other Stroke Risk Factors  MorbidObesity,Body mass index is 42.12 kg/m., BMI >/= 30 associated with increased stroke risk, recommend weight loss, diet and exercise as appropriate   Obstructive sleep apnea  Hospital day # 15  This plan of care was directed by Dr. Terrace Arabia. [facility]   ATTENDING NOTE: I reviewed above note and agree with the assessment and plan. Pt was seen and examined.   No acute event overnight, patient unchanged neurological condition.  Had PEG yesterday by trauma team, currently on tube  feeding.  Varicella-zoster PCR came back negative, DC isolation and Valtrex.  WBC elevated to 12.8, likely related to procedure yesterday, will continue monitoring.  Now post PEG tube placement, will transition heparin IV back to Coumadin with INR goal 2.5-3.5.  Coumadin pharmacy consult placed.  For detailed assessment and plan, please refer to above as I have made changes wherever appropriate.   Marvel Plan, MD PhD Stroke Neurology 04/17/2021 9:20 PM     To contact Stroke Continuity provider, please refer to [email or web address]. After hours, contact General Neurolog

## 2021-04-17 NOTE — Progress Notes (Signed)
Occupational Therapy Treatment Patient Details Name: Krista Maddox MRN: [id number] DOB: August 19, 1986 Today's Date: 04/17/2021    History of present illness The pt is a 35 yo female presenting with L sided facial droop and L sided weakness on 4/21. Underwent angiogram and revascularization on 4/21. S/p PEG 5/5. 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   OT comments  Pt making steady progress towards OT goals this session. Session conducted in conjunction with PT to maximize pts activity tolerance and optimize her participation. Pt continues to present with R gaze preference, impaired balance and decreased strength and endurance. Pt currently requires total A +2 for all aspects of bed mobility with pt unable to stand despite total A +2 efforts. Worked on sitting balance EOB with able to tolerate EOB static sitting ~ 10 mins with RUE supported on end of bed. Pt with heavy R gaze preference with pt able to track to midline when stimuli of puppy youtube video presented. Pt also noted to verbalize more this session. Pt currently requires total hand over hand assist for all ADLs. Pt would continue to benefit from skilled occupational therapy while admitted and after d/c to address the below listed limitations in order to improve overall functional mobility and facilitate independence with BADL participation. DC plan remains appropriate, will follow acutely per POC.     Follow Up Recommendations  SNF;Supervision/Assistance - 24 hour    Equipment Recommendations  Other (comment) (defer to next venue of care)    Recommendations for Other Services      Precautions / Restrictions Precautions Precautions: Fall Precaution Comments: R gaze, L hemi, abdominal binder Required Braces or Orthoses: Other Brace Other Brace: L PRAFO Restrictions Weight Bearing Restrictions: No       Mobility Bed Mobility Overal bed mobility: Needs Assistance Bed  Mobility: Rolling;Sidelying to Sit;Sit to Supine Rolling: Max assist;+2 for physical assistance;+2 for safety/equipment Sidelying to sit: Total assist;+2 for physical assistance;+2 for safety/equipment Supine to sit: +2 for physical assistance;Total assist;+2 for safety/equipment Sit to supine: Total assist;+2 for physical assistance;+2 for safety/equipment   General bed mobility comments: Pt rolling with NTs L <> R for pericare and bed sheet change, needing maxAx2 with assistance from therapists to guide roll. Pt able tp grab bed rails with R UE to assist on occasion. TAx2 to transition sidelying > sit and back to supine as pt not following cues.    Transfers Overall transfer level: Needs assistance Equipment used: 2 person hand held assist Transfers: Sit to/from Stand Sit to Stand: Total assist;+2 physical assistance;+2 safety/equipment;From elevated surface         General transfer comment: Unsuccessful in clearing buttocks from EOB with TAx2, L knee block and bil HHA, with pt not activating L lower extremity. Returned to sit EOB safely.    Balance Overall balance assessment: Needs assistance Sitting-balance support: Feet supported;Single extremity supported;No upper extremity supported Sitting balance-Leahy Scale: Poor Sitting balance - Comments: Pt with hold of end of bed rail with R UE for support majority of time and intermittent no UE support, needing min guard-A throughout, x ~10 min sitting EOB. Attempted to have pt reach off BOS min to comb hair with hand-over-hand cues but no carryover.     Standing balance-Leahy Scale: Zero Standing balance comment: Unable to stand with TAx2.  ADL either performed or assessed with clinical judgement   ADL Overall ADL's : Needs assistance/impaired     Grooming: Brushing hair;Sitting;Maximal assistance Grooming Details (indicate cue type and reason): hand over hand assist needed to complete hair  brushing from EOB         Upper Body Dressing : Total assistance;Bed level Upper Body Dressing Details (indicate cue type and reason): to change gown fro bed level       Toilet Transfer Details (indicate cue type and reason): unable to stand despite MAX A +2 Toileting- Clothing Manipulation and Hygiene: Total assistance;Bed level Toileting - Clothing Manipulation Details (indicate cue type and reason): rolling for peri care     Functional mobility during ADLs: Total assistance;+2 for physical assistance (bed mobility and one attempt at sit<>stand) General ADL Comments: pt continues to present with R gaze preference, impaired balance, and L in[place]     Vision   Additional Comments: pt able to track to midline and slightly to L when stimulus of puppy youtube video played; very heavy R gaze preference   Perception     Praxis      Cognition Arousal/Alertness: Awake/alert Behavior During Therapy: Restless;Anxious (restless and anxious initially but affect improved as session progressed) Overall Cognitive Status: Difficult to assess                                 General Comments: pt very restless and anxious upon COTA/ PT arrival as NTs assisting pt with pericare and bed mobility, however pts affect improved as session progressed. pt making more efforts to verbalize this session pt stating words such as "orange juice" "I want to ask" "puppies" and "awh". pt continues to present with heavy R gaze prefernce but is able to track puppy youtube video to midline ~ 15 seconds. Pt was able to activiely rotate head to L when therapists provided noxious stimuli on L side but pt quickly returned gaze to R side.        Exercises Exercises: General Lower Extremity;Other exercises General Exercises - Lower Extremity Ankle Circles/Pumps: PROM;Supine;Left;Other reps (comment) (x3 reps stretch into dorsiflexion) Other Exercises Other Exercises: PROM into L cervical rotation to  tolerance repeatedly sitting EOB; AROM x2 bouts when provided noxious stimuli to L ear or upper trap Other Exercises: PROM LUE; increased tone noted   Shoulder Instructions       General Comments L PRAFO re-donned at end of session; HR up to ~141 when returning to supine, otherwise in 110-120s majority of session    Pertinent Vitals/ Pain       Pain Assessment: Faces Faces Pain Scale: Hurts whole lot Pain Location: grimacing with mobility, PEG and abdomen Pain Descriptors / Indicators: Grimacing;Guarding;Moaning;Crying Pain Intervention(s): Limited activity within patient's tolerance;Monitored during session;Repositioned  Home Living                                          Prior Functioning/Environment              Frequency  Min 2X/week        Progress Toward Goals  OT Goals(current goals can now be found in the care plan section)  Progress towards OT goals: Progressing toward goals  Acute Rehab OT Goals Patient Stated Goal: unable to state, but appeared to be expressing pain and desire to  not hurt OT Goal Formulation: Patient unable to participate in goal setting Time For Goal Achievement: 05/01/21 Potential to Achieve Goals: Fair  Plan Discharge plan remains appropriate;Frequency remains appropriate    Co-evaluation      Reason for Co-Treatment: Necessary to address cognition/behavior during functional activity;For patient/therapist safety;To address functional/ADL transfers;Complexity of the patient's impairments (multi-system involvement) PT goals addressed during session: Mobility/safety with mobility;Balance OT goals addressed during session: ADL's and self-care;Strengthening/ROM      AM-PAC OT "6 Clicks" Daily Activity     Outcome Measure   Help from another person eating meals?: Total Help from another person taking care of personal grooming?: Total Help from another person toileting, which includes using toliet, bedpan, or  urinal?: Total Help from another person bathing (including washing, rinsing, drying)?: Total Help from another person to put on and taking off regular upper body clothing?: Total Help from another person to put on and taking off regular lower body clothing?: Total 6 Click Score: 6    End of Session    OT Visit Diagnosis: Other abnormalities of gait and mobility (R26.89);Muscle weakness (generalized) (M62.81);Low vision, both eyes (H54.2);Other symptoms and signs involving cognitive function;Cognitive communication deficit (R41.841);Hemiplegia and hemiparesis Symptoms and signs involving cognitive functions: Cerebral infarction Hemiplegia - Right/Left: Left Hemiplegia - caused by: Cerebral infarction   Activity Tolerance Patient tolerated treatment well   Patient Left in bed;with call bell/phone within reach;with bed alarm set   Nurse Communication Mobility status        Time: 9622-2979 OT Time Calculation (min): 41 min  Charges: OT General Charges $OT Visit: 1 Visit OT Treatments $Therapeutic Activity: 8-22 mins  Lenor Derrick., COTA/L Acute Rehabilitation Services [phone]    Barron Schmid 04/17/2021, 3:59 PM

## 2021-04-17 NOTE — Progress Notes (Signed)
Physical Therapy Treatment Patient Details Name: Krista Maddox MRN: [id number] DOB: 08-Dec-1986 Today's Date: 04/17/2021    History of Present Illness The pt is a 35 yo female presenting with L sided facial droop and L sided weakness on 4/21. Underwent angiogram and revascularization on 4/21. S/p PEG 5/5. 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    PT Comments    Treated pt in conjunction with OT to maximize quality and safety of session. Pt with improved verbal communication, stating the following during session: "puppies", "hi", "I want to ask", "orange juice", and "hurt". Pt continuing to lack capability to comprehend verbal, visual, or tactile cues though, needing max-TAx2 for all bed mobility. No success with buttocks clearance or activation of L leg with attempt to stand with TAx2 this date. Facilitated L attention and gaze to midline for up to ~15 seconds through use of puppy youtube videos on phone being progressed from her R > L slowly during passive stretching of neck into L rotation. Pt with AROM of neck into L rotation when provided noxious stimuli of L ear or upper trap muscle belly. Will continue to follow acutely. Current recommendations remain appropriate.   Follow Up Recommendations  SNF;Supervision/Assistance - 24 hour     Equipment Recommendations  None recommended by PT    Recommendations for Other Services       Precautions / Restrictions Precautions Precautions: Fall Precaution Comments: R gaze, L hemi, abdominal binder Required Braces or Orthoses: Other Brace Other Brace: L PRAFO Restrictions Weight Bearing Restrictions: No    Mobility  Bed Mobility Overal bed mobility: Needs Assistance Bed Mobility: Rolling;Sidelying to Sit;Sit to Supine Rolling: Max assist;+2 for physical assistance;+2 for safety/equipment Sidelying to sit: Total assist;+2 for physical assistance;+2 for safety/equipment   Sit to  supine: Total assist;+2 for physical assistance;+2 for safety/equipment   General bed mobility comments: Pt rolling with NTs L <> R for pericare and bed sheet change, needing maxAx2 with assistance from therapists to guide roll. Pt able tp grab bed rails with R UE to assist on occasion. TAx2 to transition sidelying > sit and back to supine as pt not following cues.    Transfers Overall transfer level: Needs assistance Equipment used: 2 person hand held assist Transfers: Sit to/from Stand Sit to Stand: Total assist;+2 physical assistance;+2 safety/equipment;From elevated surface         General transfer comment: Unsuccessful in clearing buttocks from EOB with TAx2, L knee block and bil HHA, with pt not activating L lower extremity. Returned to sit EOB safely.  Ambulation/Gait             General Gait Details: unable   Stairs             Wheelchair Mobility    Modified Rankin (Stroke Patients Only) Modified Rankin (Stroke Patients Only) Pre-Morbid Rankin Score: Moderate disability Modified Rankin: Severe disability     Balance Overall balance assessment: Needs assistance Sitting-balance support: Feet supported;Single extremity supported;No upper extremity supported Sitting balance-Leahy Scale: Poor Sitting balance - Comments: Pt with hold of end of bed rail with R UE for support majority of time and intermittent no UE support, needing min guard-A throughout, x ~10 min sitting EOB. Attempted to have pt reach off BOS min to comb hair with hand-over-hand cues but no carryover.       Standing balance comment: Unable to stand with TAx2.  Cognition Arousal/Alertness: Awake/alert Behavior During Therapy: WFL for tasks assessed/performed Overall Cognitive Status: Impaired/Different from baseline                                 General Comments: Pt with tendency to look towards R, tracking to midline to follow phone  when playing dog youtube videos to gain her attention, maintaining midline for up to ~15 second bouts before return to R and superior gaze. Positioned bed end of session rotated slightly to allow TV to be slightly past midline to her L to encourage L attention. Active rotation of head to L when provided noxious stimuli at L ear or upper trap muscle belly, otherwise automatically returns to R cervical rotation when stretch removed. Pt not following verbal, tactile, or visual cues. Pt attempting to verbally communicate this date, stating  "hurt", "everywhere", "I want to ask", "puppy", "hi", and "orange juice" during session.      Exercises General Exercises - Lower Extremity Ankle Circles/Pumps: PROM;Supine;Left;Other reps (comment) (x3 reps stretch into dorsiflexion) Other Exercises Other Exercises: PROM into L cervical rotation to tolerance repeatedly sitting EOB; AROM x2 bouts when provided noxious stimuli to L ear or upper trap    General Comments General comments (skin integrity, edema, etc.): L PRAFO re-donned at end of session; HR up to ~141 when returning to supine, otherwise in 110-120s majority of session      Pertinent Vitals/Pain Pain Assessment: Faces Faces Pain Scale: Hurts whole lot Pain Location: grimacing with mobility, PEG and abdomen Pain Descriptors / Indicators: Grimacing;Guarding;Moaning Pain Intervention(s): Limited activity within patient's tolerance;Monitored during session;Repositioned (notified RN)    Home Living                      Prior Function            PT Goals (current goals can now be found in the care plan section) Acute Rehab PT Goals Patient Stated Goal: unable to state, but appeared to be expressing pain and desire to not hurt PT Goal Formulation: With patient Time For Goal Achievement: 04/29/21 Potential to Achieve Goals: Fair Progress towards PT goals: Progressing toward goals    Frequency    Min 2X/week      PT Plan Current  plan remains appropriate;Frequency needs to be updated    Co-evaluation PT/OT/SLP Co-Evaluation/Treatment: Yes Reason for Co-Treatment: Necessary to address cognition/behavior during functional activity;For patient/therapist safety;To address functional/ADL transfers PT goals addressed during session: Mobility/safety with mobility;Balance        AM-PAC PT "6 Clicks" Mobility   Outcome Measure  Help needed turning from your back to your side while in a flat bed without using bedrails?: Total Help needed moving from lying on your back to sitting on the side of a flat bed without using bedrails?: Total Help needed moving to and from a bed to a chair (including a wheelchair)?: Total Help needed standing up from a chair using your arms (e.g., wheelchair or bedside chair)?: Total Help needed to walk in hospital room?: Total Help needed climbing 3-5 steps with a railing? : Total 6 Click Score: 6    End of Session   Activity Tolerance: Patient tolerated treatment well Patient left: in bed;with call bell/phone within reach;with bed alarm set;with SCD's reapplied;Other (comment) (with L PRAFO donned and R heel floating and L UE supported on pillow) Nurse Communication: Mobility status (noted pain; communication; no command following; use  of puppy videos to distract pt; change in bed position to encourage L attention) PT Visit Diagnosis: Hemiplegia and hemiparesis;Other abnormalities of gait and mobility (R26.89);Difficulty in walking, not elsewhere classified (R26.2);Other symptoms and signs involving the nervous system (R29.898);Muscle weakness (generalized) (M62.81) Hemiplegia - Right/Left: Left Hemiplegia - dominant/non-dominant: Non-dominant Hemiplegia - caused by: Cerebral infarction     Time: 1123-1204 PT Time Calculation (min) (ACUTE ONLY): 41 min  Charges:  $Neuromuscular Re-education: 23-37 mins                     Raymond Gurney, PT, DPT Acute Rehabilitation Services  Pager:  [phone] Office: [phone]    Jewel Baize 04/17/2021, 2:11 PM

## 2021-04-17 NOTE — Progress Notes (Signed)
XL abdominal binder ordered and placed on pt per doctor's orders.   Robina Ade, RN

## 2021-04-17 NOTE — Progress Notes (Signed)
Patient ID: Krista Maddox, female   DOB: [birth year], 35 y.o.   MRN: [id number] 1 Day Post-Op   Subjective: Talking a bit ROS negative except as listed above. Objective: Vital signs in last 24 hours: Temp:  [97.9 F (36.6 C)-100.2 F (37.9 C)] 98.9 F (37.2 C) (05/06 0832) Pulse Rate:  [93-119] 119 (05/06 0832) Resp:  [14-21] 18 (05/06 0832) BP: (91-137)/(57-126) 132/82 (05/06 0832) SpO2:  [98 %-100 %] 99 % (05/06 0832) Weight:  [109.2 kg] 109.2 kg (05/06 0500) Last BM Date: 04/15/21  Intake/Output from previous day: 05/05 0701 - 05/06 0700 In: 988 [I.V.:88; NG/GT:900] Out: 500 [Urine:500] Intake/Output this shift: No intake/output data recorded.  GI: soft, NT, PEG in place, binder open and is far too small for her  Lab Results: CBC  Recent Labs    04/16/21 0158 04/17/21 0620  WBC 10.0 12.8*  HGB 11.5* 11.4*  HCT 35.4* 36.2  PLT 272 281   BMET Recent Labs    04/15/21 0435 04/17/21 0620  NA 139 136  K 3.8 3.6  CL 110 108  CO2 22 21*  GLUCOSE 112* 137*  BUN 20 21*  CREATININE 0.64 0.76  CALCIUM 8.8* 9.1   PT/INR Recent Labs    04/15/21 0435 04/17/21 0620  LABPROT 20.2* 17.0*  INR 1.7* 1.4*   ABG No results for input(s): PHART, HCO3 in the last 72 hours.  Invalid input(s): PCO2, PO2  Studies/Results: No results found.  Anti-infectives: Anti-infectives (From admission, onward)   Start     Dose/Rate Route Frequency Ordered Stop   04/12/21 1600  valACYclovir (VALTREX) tablet 1,000 mg        1,000 mg Per Tube 3 times daily 04/12/21 1007 04/18/21 2159   04/11/21 2200  valACYclovir (VALTREX) tablet 1,000 mg  Status:  Discontinued        1,000 mg Oral 3 times daily 04/11/21 1940 04/12/21 1007      Assessment/Plan: Multiple cryptogenic CVAs S/P PEG 5/6 - continue to use, she needs an appropriately sized binder to protect the PEG. She is trying to grab at things. I discussed this with her nurse.  LOS: 15 days    Violeta Gelinas, MD, MPH,  FACS Trauma & General Surgery Use [email or web address] to contact on call provider  04/17/2021

## 2021-04-17 NOTE — Progress Notes (Signed)
ANTICOAGULATION CONSULT NOTE  Pharmacy Consult for warfarin > heparin Indication:  Acute stroke  Allergies  Allergen Reactions  . Tape Rash    Cannot tolerate paper tape    Patient Measurements: Height: 5\' 5"  (165.1 cm) Weight: 109.2 kg (240 lb 11.9 oz) IBW/kg (Calculated) : 57  Heparin dosing weight: 72 kg    Vital Signs: Temp: 97.9 F (36.6 C) (05/06 1100) Temp Source: Oral (05/06 1100) BP: 114/93 (05/06 1100) Pulse Rate: 119 (05/06 0832)  Labs: Recent Labs    04/15/21 0435 04/15/21 1822 04/16/21 0158 04/17/21 0620  HGB 11.6*  --  11.5* 11.4*  HCT 36.2  --  35.4* 36.2  PLT 268  --  272 281  LABPROT 20.2*  --   --  17.0*  INR 1.7*  --   --  1.4*  HEPARINUNFRC  --  0.29* 0.66 0.32  CREATININE 0.64  --   --  0.76    Estimated Creatinine Clearance: 121.9 mL/min (by C-G formula based on SCr of 0.76 mg/dL).  Assessment: 35 y.o female on apixaban PTA for hx prior strokes, ?DVT.  Last dose PTA " unknown," admitted 04/02/21 with acute CVA.  No TPA due to Apixaban pta.   -4/21 s/p thrombectomy. -Neurologist consulted pharmacist on 04/08/21 to start warfarin with goal INR 2.5-3.5 per Dr. 04/10/21.  Baseline INR 1.2 on admit 4/21 Hypercoagulable panel:  Homocysteine serum slightly elevated at 15.1, otherwise negative to date.   Warfarin on hold for PEG placement - now placed 5/5. Pharmacy consulted to transition to IV heparin drip when INR < 2.5 (started 5/4)  Heparin level therapeutic at 0.32 this AM. CBC stable. INR down to 1.4. No active bleed issues reported.  Goal of Therapy:  INR 2.5-3.5 per Neurologist  Anti-Xa level 0.3-0.5  Monitor platelets by anticoagulation protocol: Yes   Plan:  Continue heparin infusion at 1100 units/hr Monitor daily heparin level and CBC, s/sx bleeding F/u plans for resumption of warfarin    7/4, PharmD, BCPS Please check AMION for all [facility] contact numbers Clinical Pharmacist 04/17/2021 2:33 PM

## 2021-04-17 NOTE — Anesthesia Postprocedure Evaluation (Signed)
Anesthesia Post Note  Patient: Krista Maddox  Procedure(s) Performed: ESOPHAGOGASTRODUODENOSCOPY (EGD) WITH PROPOFOL (N/A ) PERCUTANEOUS ENDOSCOPIC GASTROSTOMY (PEG) PLACEMENT (N/A )     Patient location during evaluation: PACU Anesthesia Type: MAC Level of consciousness: awake and alert Pain management: pain level controlled Vital Signs Assessment: post-procedure vital signs reviewed and stable Respiratory status: spontaneous breathing, nonlabored ventilation, respiratory function stable and patient connected to nasal cannula oxygen Cardiovascular status: stable and blood pressure returned to baseline Postop Assessment: no apparent nausea or vomiting Anesthetic complications: no   No complications documented.  Last Vitals:  Vitals:   04/17/21 0040 04/17/21 0322  BP: 109/77 120/79  Pulse: 94 98  Resp: 20 20  Temp: 37.1 C 37.2 C  SpO2: 99% 98%    Last Pain:  Vitals:   04/17/21 0322  TempSrc: Oral  PainSc:                  Nadalyn Deringer

## 2021-04-18 DIAGNOSIS — I63511 Cerebral infarction due to unspecified occlusion or stenosis of right middle cerebral artery: Secondary | ICD-10-CM | POA: Diagnosis not present

## 2021-04-18 LAB — CBC
HCT: 34 % — ABNORMAL LOW (ref 36.0–46.0)
Hemoglobin: 10.7 g/dL — ABNORMAL LOW (ref 12.0–15.0)
MCH: 28.7 pg (ref 26.0–34.0)
MCHC: 31.5 g/dL (ref 30.0–36.0)
MCV: 91.2 fL (ref 80.0–100.0)
Platelets: 269 10*3/uL (ref 150–400)
RBC: 3.73 MIL/uL — ABNORMAL LOW (ref 3.87–5.11)
RDW: 15.2 % (ref 11.5–15.5)
WBC: 13.9 10*3/uL — ABNORMAL HIGH (ref 4.0–10.5)
nRBC: 0 % (ref 0.0–0.2)

## 2021-04-18 LAB — GLUCOSE, CAPILLARY
Glucose-Capillary: 107 mg/dL — ABNORMAL HIGH (ref 70–99)
Glucose-Capillary: 116 mg/dL — ABNORMAL HIGH (ref 70–99)
Glucose-Capillary: 117 mg/dL — ABNORMAL HIGH (ref 70–99)
Glucose-Capillary: 120 mg/dL — ABNORMAL HIGH (ref 70–99)
Glucose-Capillary: 121 mg/dL — ABNORMAL HIGH (ref 70–99)
Glucose-Capillary: 98 mg/dL (ref 70–99)

## 2021-04-18 LAB — BASIC METABOLIC PANEL
Anion gap: 8 (ref 5–15)
BUN: 19 mg/dL (ref 6–20)
CO2: 21 mmol/L — ABNORMAL LOW (ref 22–32)
Calcium: 8.7 mg/dL — ABNORMAL LOW (ref 8.9–10.3)
Chloride: 105 mmol/L (ref 98–111)
Creatinine, Ser: 0.61 mg/dL (ref 0.44–1.00)
GFR, Estimated: >60 mL/min (ref >60)
Glucose, Bld: 124 mg/dL — ABNORMAL HIGH (ref 70–99)
Potassium: 3.3 mmol/L — ABNORMAL LOW (ref 3.5–5.1)
Sodium: 134 mmol/L — ABNORMAL LOW (ref 135–145)

## 2021-04-18 LAB — HEPARIN LEVEL (UNFRACTIONATED)
Heparin Unfractionated: 0.23 IU/mL — ABNORMAL LOW (ref 0.30–0.70)
Heparin Unfractionated: 0.29 IU/mL — ABNORMAL LOW (ref 0.30–0.70)

## 2021-04-18 LAB — PROTIME-INR
INR: 1.3 — ABNORMAL HIGH (ref 0.8–1.2)
Prothrombin Time: 16.6 seconds — ABNORMAL HIGH (ref 11.4–15.2)

## 2021-04-18 MED ORDER — WARFARIN SODIUM 7.5 MG PO TABS
7.5000 mg | ORAL_TABLET | Freq: Once | ORAL | Status: AC
  Administered 2021-04-18: 7.5 mg via ORAL
  Filled 2021-04-18: qty 1

## 2021-04-18 NOTE — Progress Notes (Addendum)
STROKE TEAM PROGRESS NOTE   INTERVAL HISTORY No family is at the bedside. 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 Spontaneously moving right hemibody and resists exam on the right. +attempt to initiate speech today but grunting sound only.  S/p PEG yestrday. Site with minimal old dried serosang dge, no swelling or active drainage.  Vitals:   04/18/21 0013 04/18/21 0427 04/18/21 0500 04/18/21 0744  BP: 103/63 125/76  132/76  Pulse: (!) 104 (!) 108  (!) 116  Resp: 20 (!) 21  (!) 21  Temp: 99.3 F (37.4 C) 98.2 F (36.8 C)  100 F (37.8 C)  TempSrc: Oral Oral  Axillary  SpO2: 96% 97%  95%  Weight:   109.2 kg   Height:       CBC:  Recent Labs  Lab 04/17/21 0620 04/18/21 0157  WBC 12.8* 13.9*  HGB 11.4* 10.7*  HCT 36.2 34.0*  MCV 92.1 91.2  PLT 281 269   Basic Metabolic Panel:  Recent Labs  Lab 04/17/21 0620 04/18/21 0157  NA 136 134*  K 3.6 3.3*  CL 108 105  CO2 21* 21*  GLUCOSE 137* 124*  BUN 21* 19  CREATININE 0.76 0.61  CALCIUM 9.1 8.7*   IMAGING past 24 hours No results found.  PHYSICAL EXAM  Temp:  [97.9 F (36.6 C)-100 F (37.8 C)] 100 F (37.8 C) (05/07 0744) Pulse Rate:  [104-116] 116 (05/07 0744) Resp:  [20-22] 21 (05/07 0744) BP: (103-133)/(63-93) 132/76 (05/07 0744) SpO2:  [95 %-98 %] 95 % (05/07 0744) Weight:  [109.2 kg] 109.2 kg (05/07 0500)  General - Well nourished, well developed, in no apparent distress.  Resp: No extra work of breathing.  Cardiovascular - Regular rhythm and rate. Neuro - Awake alert eyes open, nonverbal, not following commands. Right gaze preference, barely cross midline.  Blinking to visual threat on the right but not on the left.  Left facial droop.  Left upper extremity flaccid, left LE knee flexion with pain at left foot  2/3. Hypohophonic vocalization with grunt which seemed to be attempt at speech. Right upper extremity spontaneous movement against gravity.   Right lower extremity at least 3/5 spontaneously. Increased muscle tone at LUE and LLE.    ASSESSMENT/PLAN Ms.Krista Maddox history of multiple strokes who presented as a code stroke with symptoms suggestive of LVO. tPA was not given due to Eliquis. CTA head and neck revealed an occlusion of distal right M2 MCA with partial reconstitution and subsequent reocclusion of most M2 branches. Patient underwent thrombectomy.   Recurrent cryptogenic embolic strokes -Acute infarct of the R MCA due to right M1 occlusion s/p IR with TICI2c,cryptogenic etiology  Code Stroke:CT head No acuteintracranial hemorrhage.Acute infarcts in the right MCAterritory.ASPECTS8.   CTA head & neckOcclusion of distal right M1 MCA with partial reconstitution and subsequent reocclusion of most M2 branches. Poor collaterals.  CTPerfusion imaging demonstrates core infarction 99 mL and penumbra of 34 mL. The extent of core infarction is calculated at greater than apparent loss of gray-white differentiation on noncontrast head CT. Therefore, penumbra may be greater.  2D EchoLVEF 60-65%, no structural abnormalities noted that could increase risk for CVA  Repeat CT Head 4/24-large left MCA infarct, no midline shift. Small SAH sylvian fissure, diminishing in size  Hypercoagulable panel: Homocysteine 15.1 due to smoking, otherwise negative to date  LDL48  HgbA1c5.7  VTE prophylaxis -SCDs+ Lovenox  aspirin 325 mg daily and Eliquis (apixaban)  dailyprior to admission, now on ASA 325 with heparin IV for PEG placement. Now transition back to coumadin with INR goal 2.5-3.5  PT-INR 1.0->1.2->1.5->2.2->3.0->2.7->1.7  Therapy recommendations:SNF  Disposition:Pending  Hx of cryptogenic strokes  In 08/2017 patient admitted for left sided numbness and headache.  MRI showed right cerebellum, right temporoparietal small infarcts.  EF 55 to 60%, CT head and neck unremarkable.  LDL  89, A1c 4.8.  TCD bubble study showed trivial PFO.  DVT negative.  TEE no PFO.  Hypercoagulable and autoimmune work-up negative.  Discharged with aspirin and Lipitor.  01/2018 episode of left facial numbness and left leg weakness.  CT head and neck unremarkable.  MRI negative.  06/2018 admitted to [facility] for right hand/face numbness.  MRI showed right tiny parietal infarct.  10/2020 admitted to [facility] for aphasia status post tPA.  CTA neck showed left M2 occlusion.  MRI showed left MCA and punctate right frontal infarct.  TCD bubble study potential trivial PFO, TEE tiny PFO.  11/2020 she was referred to loop recorder which was not done as physician thought she should be candidate for anticoagulation.  Started on Eliquis 5 mg twice daily.  Possible shingles Negative PCR  Left flank skin lesion with blisters   Concerning for shingles  Varicella zoster PCR negative, spoke with IC RN and precautions discontinued 5/6.   Now trasnsferred to negative pressure room  Contact and airborne isolation  On valtrex discontinued  WOC on board for dressing  Leukocytosis, resolved  Low grade ?postop fever 100 5/5 overnight with slight increase in WBC to 12, monitor   WBC 11.7>9.8->10.0->10.9->10.0->12  UA negative  Tachycardia, resolved  Labtealol 100mg  BID  HR 90s  Hyperlipidemia  Home meds:atrovastatin 80mg  and zetia 10mg    Will restart post CT if stable  LDL 48, goal < 70  Resumed, on lipitor 80 and zetia 10  Continue statin at discharge  Dysphagia- DYS diet 1  Cortrack placementfor TF  SLP and RD following  Zero intake x 3 day calorie count on nocturnal feedings alone.   Restarted full tube feeding regimen  S/p PEG POD1: care per Dr. , need properly fitting abd binder which has been ordered and discussed with RN  Birth control  Recommend OP PCP or OBGYN discontinue Depo - provera due to numerous Contraindications including hx of tobacco use, migraines,  and multiple strokes.  Tobacco abuse  Current smoker  Smoking cessation counselingwill beprovided  Contraindicated for birth control Depo  Hx of pseudotumor cerebri Chronic migraine  Follows with Dr. 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  Other Stroke Risk Factors  MorbidObesity,Body mass index is 42.12 kg/m., BMI >/= 30 associated with increased stroke risk, recommend weight loss, diet and exercise as appropriate   Obstructive sleep apnea  Hospital day # 16     04/18/2021 10:22 AM     To contact Stroke Continuity provider, please refer to [facility]. After hours, contact General Neurolog

## 2021-04-18 NOTE — Progress Notes (Addendum)
ANTICOAGULATION CONSULT NOTE  Pharmacy Consult for heparin > warfarin Indication:  Acute stroke  Allergies  Allergen Reactions  . Tape Rash    Cannot tolerate paper tape    Patient Measurements: Height: 5\' 5"  (165.1 cm) Weight: 109.2 kg (240 lb 11.9 oz) IBW/kg (Calculated) : 57  Heparin dosing weight: 72 kg    Vital Signs: Temp: 98.2 F (36.8 C) (05/07 0427) Temp Source: Oral (05/07 0427) BP: 125/76 (05/07 0427) Pulse Rate: 108 (05/07 0427)  Labs: Recent Labs    04/16/21 0158 04/17/21 0620 04/18/21 0157  HGB 11.5* 11.4* 10.7*  HCT 35.4* 36.2 34.0*  PLT 272 281 269  LABPROT  --  17.0* 16.6*  INR  --  1.4* 1.3*  HEPARINUNFRC 0.66 0.32 0.23*  CREATININE  --  0.76 0.61    Estimated Creatinine Clearance: 121.9 mL/min (by C-G formula based on SCr of 0.61 mg/dL).  Assessment: 35 y.o female on apixaban PTA for hx prior strokes, ?DVT.  Last dose PTA " unknown," admitted 04/02/21 with acute CVA. No TPA due to Apixaban pta.   -4/21 s/p thrombectomy. -Neurologist consulted pharmacist on 04/08/21 to start warfarin with goal INR 2.5-3.5 per Dr. 04/10/21.  Baseline INR 1.2 on admit 4/21 Hypercoagulable panel:  Homocysteine serum slightly elevated at 15.1, otherwise negative to date.   Heparin level low 0.23 this AM. Warfarin resumed 5/6 PM for INR goal of 2.5-3.5. INR 1.3 - stable today. CBC stable. No bleeding or issues with infusion per discussion with RN.  Goal of Therapy:  INR 2.5-3.5 per Neurologist  Anti-Xa level 0.3-0.5  Monitor platelets by anticoagulation protocol: Yes   Plan:  Increase heparin infusion to 1250 units/hr Recheck heparin level in 6 hrs Warfarin 7.5mg  PO x 1 again tonight Monitor daily INR/CBC, s/sx bleeding   5/21, PharmD, BCPS Please check AMION for all [facility] contact numbers Clinical Pharmacist 04/18/2021 7:28 AM    ADDENDUM: Heparin level remains very slightly subtherapeutic at 0.29 this afternoon after rate increase this  AM. No bleeding or issues with infusion per discussion with RN.  Plan: Increase heparin to 1350 units/hr Recheck heparin level with AM labs   06/18/2021, PharmD, BCPS Please check AMION for all [facility] contact numbers Clinical Pharmacist 04/18/2021 3:24 PM

## 2021-04-19 DIAGNOSIS — I63511 Cerebral infarction due to unspecified occlusion or stenosis of right middle cerebral artery: Secondary | ICD-10-CM | POA: Diagnosis not present

## 2021-04-19 LAB — CBC
HCT: 33.8 % — ABNORMAL LOW (ref 36.0–46.0)
Hemoglobin: 10.9 g/dL — ABNORMAL LOW (ref 12.0–15.0)
MCH: 29.1 pg (ref 26.0–34.0)
MCHC: 32.2 g/dL (ref 30.0–36.0)
MCV: 90.4 fL (ref 80.0–100.0)
Platelets: 262 10*3/uL (ref 150–400)
RBC: 3.74 MIL/uL — ABNORMAL LOW (ref 3.87–5.11)
RDW: 15.6 % — ABNORMAL HIGH (ref 11.5–15.5)
WBC: 13.3 10*3/uL — ABNORMAL HIGH (ref 4.0–10.5)
nRBC: 0 % (ref 0.0–0.2)

## 2021-04-19 LAB — GLUCOSE, CAPILLARY
Glucose-Capillary: 104 mg/dL — ABNORMAL HIGH (ref 70–99)
Glucose-Capillary: 111 mg/dL — ABNORMAL HIGH (ref 70–99)
Glucose-Capillary: 118 mg/dL — ABNORMAL HIGH (ref 70–99)
Glucose-Capillary: 130 mg/dL — ABNORMAL HIGH (ref 70–99)
Glucose-Capillary: 94 mg/dL (ref 70–99)
Glucose-Capillary: 99 mg/dL (ref 70–99)

## 2021-04-19 LAB — PROTIME-INR
INR: 1.5 — ABNORMAL HIGH (ref 0.8–1.2)
Prothrombin Time: 17.7 seconds — ABNORMAL HIGH (ref 11.4–15.2)

## 2021-04-19 LAB — HEPARIN LEVEL (UNFRACTIONATED)
Heparin Unfractionated: <0.1 IU/mL — ABNORMAL LOW (ref 0.30–0.70)
Heparin Unfractionated: 0.34 IU/mL (ref 0.30–0.70)

## 2021-04-19 MED ORDER — WARFARIN SODIUM 7.5 MG PO TABS
7.5000 mg | ORAL_TABLET | Freq: Once | ORAL | Status: AC
  Administered 2021-04-19: 7.5 mg via ORAL
  Filled 2021-04-19: qty 1

## 2021-04-19 NOTE — Progress Notes (Signed)
ANTICOAGULATION CONSULT NOTE  Pharmacy Consult for heparin > warfarin Indication:  Acute stroke  Allergies  Allergen Reactions  . Tape Rash    Cannot tolerate paper tape    Patient Measurements: Height: 5\' 5"  (165.1 cm) Weight: 109.3 kg (240 lb 15.4 oz) IBW/kg (Calculated) : 57  Heparin dosing weight: 72 kg    Vital Signs: Temp: 99.5 F (37.5 C) (05/08 0705) Temp Source: Oral (05/08 0705) BP: 113/84 (05/08 0931) Pulse Rate: 116 (05/08 0931)  Labs: Recent Labs    04/17/21 0620 04/18/21 0157 04/18/21 1354 04/19/21 0210  HGB 11.4* 10.7*  --  10.9*  HCT 36.2 34.0*  --  33.8*  PLT 281 269  --  262  LABPROT 17.0* 16.6*  --  17.7*  INR 1.4* 1.3*  --  1.5*  HEPARINUNFRC 0.32 0.23* 0.29* <0.10*  CREATININE 0.76 0.61  --   --     Estimated Creatinine Clearance: 121.9 mL/min (by C-G formula based on SCr of 0.61 mg/dL).  Assessment: 35 y.o female on apixaban PTA for hx prior strokes, ?DVT.  Last dose PTA " unknown," admitted 04/02/21 with acute CVA. No TPA due to Apixaban pta.   -4/21 s/p thrombectomy. -Neurologist consulted pharmacist on 04/08/21 to start warfarin with goal INR 2.5-3.5 per Dr. 04/10/21.  Baseline INR 1.2 on admit 4/21 Hypercoagulable panel:  Homocysteine serum slightly elevated at 15.1, otherwise negative to date.   Heparin level undetectable this AM due to leaking from IV site. Per discussion with RN, issue now resolved and heparin resumed at 0457 this AM. Warfarin resumed 5/6 PM for INR goal of 2.5-3.5. INR up to 1.5 today. CBC stable. No bleeding or further issues with infusion per discussion with RN.  Goal of Therapy:  INR 2.5-3.5 per Neurologist  Anti-Xa level 0.3-0.5  Monitor platelets by anticoagulation protocol: Yes   Plan:  Continue heparin infusion at 1350 units/hr for now Recheck heparin level this afternoon Warfarin 7.5mg  PO x 1 again tonight Monitor daily INR/CBC, s/sx bleeding   5/21, PharmD, BCPS Please check AMION for  all [facility] contact numbers Clinical Pharmacist 04/19/2021 10:12 AM

## 2021-04-19 NOTE — Progress Notes (Signed)
STROKE TEAM PROGRESS NOTE   INTERVAL HISTORY No family is at the bedside. 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   Vitals:   04/18/21 2142 04/19/21 0000 04/19/21 0400 04/19/21 0434  BP: 120/82 113/66 115/78   Pulse: (!) 113  100   Resp:   20   Temp:  98.9 F (37.2 C) 98.7 F (37.1 C)   TempSrc:  Axillary Axillary   SpO2:      Weight:    109.3 kg  Height:       CBC:  Recent Labs  Lab 04/18/21 0157 04/19/21 0210  WBC 13.9* 13.3*  HGB 10.7* 10.9*  HCT 34.0* 33.8*  MCV 91.2 90.4  PLT 269 262   Basic Metabolic Panel:  Recent Labs  Lab 04/17/21 0620 04/18/21 0157  NA 136 134*  K 3.6 3.3*  CL 108 105  CO2 21* 21*  GLUCOSE 137* 124*  BUN 21* 19  CREATININE 0.76 0.61  CALCIUM 9.1 8.7*   IMAGING past 24 hours No results found.  PHYSICAL EXAM  Temp:  [98 F (36.7 C)-100.9 F (38.3 C)] 98.7 F (37.1 C) (05/08 0400) Pulse Rate:  [99-116] 100 (05/08 0400) Resp:  [18-23] 20 (05/08 0400) BP: (110-132)/(66-82) 115/78 (05/08 0400) SpO2:  [95 %-97 %] 97 % (05/07 1814) Weight:  [109.3 kg] 109.3 kg (05/08 0434)  General - Well nourished, well developed, in no apparent distress.  Resp: No extra work of breathing.  Cardiovascular - Regular rhythm and rate. Neuro - Awake alert eyes open, nonverbal, not following commands. Right gaze preference, barely cross midline.  Blinking to visual threat on the right but not on the left.  Left facial droop.  Left upper extremity flaccid, left LE knee flexion with pain at left foot  2/3. Hypohophonic vocalization with grunt which seemed to be attempt at speech. Right upper extremity spontaneous movement against gravity.  Right lower extremity at least 3/5 spontaneously. Increased muscle tone at LUE and LLE.    ASSESSMENT/PLAN Ms.Srija L Matthewsis a 35 y.o.femalewith history of multiple strokes who presented as a code stroke with symptoms suggestive of LVO. tPA was not  given due to Eliquis. CTA head and neck revealed an occlusion of distal right M2 MCA with partial reconstitution and subsequent reocclusion of most M2 branches. Patient underwent thrombectomy.   Recurrent cryptogenic embolic strokes -Acute infarct of the R MCA due to right M1 occlusion s/p IR with TICI2c,cryptogenic etiology  Code Stroke:CT head No acuteintracranial hemorrhage.Acute infarcts in the right MCAterritory.ASPECTS8.   CTA head & neckOcclusion of distal right M1 MCA with partial reconstitution and subsequent reocclusion of most M2 branches. Poor collaterals.  CTPerfusion imaging demonstrates core infarction 99 mL and penumbra of 34 mL. The extent of core infarction is calculated at greater than apparent loss of gray-white differentiation on noncontrast head CT. Therefore, penumbra may be greater.  2D EchoLVEF 60-65%, no structural abnormalities noted that could increase risk for CVA  Repeat CT Head 4/24-large left MCA infarct, no midline shift. Small SAH sylvian fissure, diminishing in size  Hypercoagulable panel: Homocysteine 15.1 due to smoking, otherwise negative to date  LDL48  HgbA1c5.7  VTE prophylaxis -SCDs+ Lovenox  aspirin 325 mg daily and Eliquis (apixaban) dailyprior to admission, now on ASA 325 with heparin IV for PEG placement. Now transition back to coumadin with INR goal 2.5-3.5  PT-INR 1.0->1.2->1.5->2.2->3.0->2.7->1.7  Therapy recommendations:SNF  Disposition:Pending  Hx of cryptogenic strokes  In 08/2017 patient admitted for  left sided numbness and headache.  MRI showed right cerebellum, right temporoparietal small infarcts.  EF 55 to 60%, CT head and neck unremarkable.  LDL 89, A1c 4.8.  TCD bubble study showed trivial PFO.  DVT negative.  TEE no PFO.  Hypercoagulable and autoimmune work-up negative.  Discharged with aspirin and Lipitor.  01/2018 episode of left facial numbness and left leg weakness.  CT head and neck unremarkable.   MRI negative.  06/2018 admitted to [facility] for right hand/face numbness.  MRI showed right tiny parietal infarct.  10/2020 admitted to [facility] for aphasia status post tPA.  CTA neck showed left M2 occlusion.  MRI showed left MCA and punctate right frontal infarct.  TCD bubble study potential trivial PFO, TEE tiny PFO.  11/2020 she was referred to loop recorder which was not done as physician thought she should be candidate for anticoagulation.  Started on Eliquis 5 mg twice daily.  Possible shingles Negative PCR  Left flank skin lesion with blisters   Concerning for shingles  Varicella zoster PCR negative, spoke with IC RN and precautions discontinued 5/6.   Now trasnsferred to negative pressure room  Contact and airborne isolation  On valtrex discontinued  WOC on board for dressing  Leukocytosis, resolved  Low grade ?postop fever 100 5/5 overnight with slight increase in WBC to 12, monitor   WBC 11.7>9.8->10.0->10.9->10.0->12  UA negative  Tachycardia, resolved  Labtealol 100mg  BID  HR 90s  Hyperlipidemia  Home meds:atrovastatin 80mg  and zetia 10mg    Will restart post CT if stable  LDL 48, goal < 70  Resumed, on lipitor 80 and zetia 10  Continue statin at discharge  Dysphagia- DYS diet 1  Cortrack placementfor TF  SLP and RD following  Zero intake x 3 day calorie count on nocturnal feedings alone.   Restarted full tube feeding regimen  S/p PEG POD1: care per Dr. , need properly fitting abd binder which has been ordered and discussed with RN  Birth control  Recommend OP PCP or OBGYN discontinue Depo - provera due to numerous Contraindications including hx of tobacco use, migraines, and multiple strokes.  Tobacco abuse  Current smoker  Smoking cessation counselingwill beprovided  Contraindicated for birth control Depo  Hx of pseudotumor cerebri Chronic migraine  Follows with Dr. 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  Other Stroke Risk Factors  MorbidObesity,Body mass index is 42.12 kg/m., BMI >/= 30 associated with increased stroke risk, recommend weight loss, diet and exercise as appropriate   Obstructive sleep apnea  Hospital day # 17     04/19/2021 6:00 AM     To contact Stroke Continuity provider, please refer to [facility]. After hours, contact General Neurolog

## 2021-04-19 NOTE — Progress Notes (Signed)
ANTICOAGULATION CONSULT NOTE  Pharmacy Consult for heparin > warfarin Indication:  Acute stroke  Allergies  Allergen Reactions  . Tape Rash    Cannot tolerate paper tape    Patient Measurements: Height: 5\' 5"  (165.1 cm) Weight: 109.3 kg (240 lb 15.4 oz) IBW/kg (Calculated) : 57  Heparin dosing weight: 72 kg    Vital Signs: Temp: 99.5 F (37.5 C) (05/08 0705) Temp Source: Oral (05/08 0705) BP: 113/84 (05/08 0931) Pulse Rate: 116 (05/08 0931)  Labs: Recent Labs    04/17/21 0620 04/18/21 0157 04/18/21 1354 04/19/21 0210 04/19/21 1442  HGB 11.4* 10.7*  --  10.9*  --   HCT 36.2 34.0*  --  33.8*  --   PLT 281 269  --  262  --   LABPROT 17.0* 16.6*  --  17.7*  --   INR 1.4* 1.3*  --  1.5*  --   HEPARINUNFRC 0.32 0.23* 0.29* <0.10* 0.34  CREATININE 0.76 0.61  --   --   --     Estimated Creatinine Clearance: 121.9 mL/min (by C-G formula based on SCr of 0.61 mg/dL).  Assessment: 35 y.o female on apixaban PTA for hx prior strokes, ?DVT.  Last dose PTA " unknown," admitted 04/02/21 with acute CVA. No TPA due to Apixaban pta.   -4/21 s/p thrombectomy. -Neurologist consulted pharmacist on 04/08/21 to start warfarin with goal INR 2.5-3.5 per Dr. 04/10/21.  Heparin level now therapeutic at 0.34, CBC stable this am.  Goal of Therapy:  INR 2.5-3.5 per Neurologist  Anti-Xa level 0.3-0.5  Monitor platelets by anticoagulation protocol: Yes   Plan:  Continue heparin infusion at 1350 units/hr for now Daily heparin level and CBC   Morrie Sheldon, PharmD, BCPS, [facility] Clinical Pharmacist [phone] Please check AMION for all [facility] Pharmacy numbers 04/19/2021

## 2021-04-20 DIAGNOSIS — I63511 Cerebral infarction due to unspecified occlusion or stenosis of right middle cerebral artery: Secondary | ICD-10-CM | POA: Diagnosis not present

## 2021-04-20 LAB — PROTIME-INR
INR: 2 — ABNORMAL HIGH (ref 0.8–1.2)
INR: 2 — ABNORMAL HIGH (ref 0.8–1.2)
Prothrombin Time: 22.2 seconds — ABNORMAL HIGH (ref 11.4–15.2)
Prothrombin Time: 22.5 seconds — ABNORMAL HIGH (ref 11.4–15.2)

## 2021-04-20 LAB — CBC
HCT: 34.4 % — ABNORMAL LOW (ref 36.0–46.0)
Hemoglobin: 11 g/dL — ABNORMAL LOW (ref 12.0–15.0)
MCH: 28.8 pg (ref 26.0–34.0)
MCHC: 32 g/dL (ref 30.0–36.0)
MCV: 90.1 fL (ref 80.0–100.0)
Platelets: 283 10*3/uL (ref 150–400)
RBC: 3.82 MIL/uL — ABNORMAL LOW (ref 3.87–5.11)
RDW: 15.4 % (ref 11.5–15.5)
WBC: 10.1 10*3/uL (ref 4.0–10.5)
nRBC: 0 % (ref 0.0–0.2)

## 2021-04-20 LAB — GLUCOSE, CAPILLARY
Glucose-Capillary: 107 mg/dL — ABNORMAL HIGH (ref 70–99)
Glucose-Capillary: 110 mg/dL — ABNORMAL HIGH (ref 70–99)
Glucose-Capillary: 93 mg/dL (ref 70–99)
Glucose-Capillary: 93 mg/dL (ref 70–99)
Glucose-Capillary: 99 mg/dL (ref 70–99)
Glucose-Capillary: 99 mg/dL (ref 70–99)

## 2021-04-20 LAB — BASIC METABOLIC PANEL
Anion gap: 9 (ref 5–15)
BUN: 19 mg/dL (ref 6–20)
CO2: 21 mmol/L — ABNORMAL LOW (ref 22–32)
Calcium: 9 mg/dL (ref 8.9–10.3)
Chloride: 108 mmol/L (ref 98–111)
Creatinine, Ser: 0.57 mg/dL (ref 0.44–1.00)
GFR, Estimated: >60 mL/min (ref >60)
Glucose, Bld: 126 mg/dL — ABNORMAL HIGH (ref 70–99)
Potassium: 3.9 mmol/L (ref 3.5–5.1)
Sodium: 138 mmol/L (ref 135–145)

## 2021-04-20 LAB — HEPARIN LEVEL (UNFRACTIONATED)
Heparin Unfractionated: >1.1 IU/mL — ABNORMAL HIGH (ref 0.30–0.70)
Heparin Unfractionated: >1.1 IU/mL — ABNORMAL HIGH (ref 0.30–0.70)
Heparin Unfractionated: 0.39 IU/mL (ref 0.30–0.70)
Heparin Unfractionated: 0.5 IU/mL (ref 0.30–0.70)

## 2021-04-20 MED ORDER — WARFARIN SODIUM 6 MG PO TABS
6.0000 mg | ORAL_TABLET | Freq: Once | ORAL | Status: DC
  Filled 2021-04-20: qty 1

## 2021-04-20 MED ORDER — WARFARIN SODIUM 6 MG PO TABS
6.0000 mg | ORAL_TABLET | Freq: Once | ORAL | Status: AC
  Administered 2021-04-20: 6 mg
  Filled 2021-04-20: qty 1

## 2021-04-20 MED ORDER — HEPARIN (PORCINE) 25000 UT/250ML-% IV SOLN
1300.0000 [IU]/h | INTRAVENOUS | Status: DC
  Administered 2021-04-20: 1200 [IU]/h via INTRAVENOUS
  Filled 2021-04-20: qty 250

## 2021-04-20 NOTE — Progress Notes (Signed)
Nutrition Follow-up  DOCUMENTATION CODES:  Obesity unspecified  INTERVENTION:  Continue current TF order: Osmolite 1.5at 87ml/h(1200ml per day) Prosource TF54ml BID  free water flush Q8H  Provides1960kcal, 119gm protein, free water daily ( total free water with IVF at current rate and FWF as ordered above)  NUTRITION DIAGNOSIS:  Inadequate oral intake related to inability to eat as evidenced by NPO status. - ongoing, pt now has PEG tube to meet nutrition needs  GOAL:  Patient will meet greater than or equal to 90% of their needs - meeting with TF  MONITOR:  TF tolerance,Weight trends,Labs,I & O's  REASON FOR ASSESSMENT:  Consult Enteral/tube feeding initiation and management  ASSESSMENT:  Pt with PMH of prior strokes x 4 with mild residual aphasia and L sided numbness, chronic MHAs, HLD, TTP duing pregnancy admitted for R gaze preference, L sided weakness, aphasia and L facial droop with R MCA stroke s.p thrombectomy. 4/22 - cortrak placed (gastric tip) 4/27 - diet advanced to dysphagia 1 with thin liquids 5/5 - cortrak removed; PEG placement  Visited pt at bedside. Pt did not acknowledge RD's presence or questions about new PEG tube. Pt did not appear to be in any pain. Checked pump and was running at 50 ml/hr of Osmolite 1.5.  Continue current TF order.  Current TF order: Osmolite 1.5at 63ml/h(1200ml per day) Prosource TF75ml BID  free water flush Q8H  Provides1960kcal, 119gm protein, free water daily ( total free water with IVF at current rate and FWF as ordered above)  Admit wt: 114.8 kg Current wt: 109.3 kg  Medications: reviewed; Protonix, Senokot BID, Warfarin Labs: reviewed; Na 134, K 3.3, CBG 94-130  Diet Order:   Diet Order            Diet NPO time specified  Diet effective midnight                EDUCATION NEEDS:  No education needs have been identified at this time  Skin:  Skin Assessment:  Reviewed RN Assessment  Last BM:  04/18/21  Height:  Ht Readings from Last 1 Encounters:  04/02/21 5\' 5"  (1.651 m)   Weight:  Wt Readings from Last 1 Encounters:  04/20/21 109.3 kg   Ideal Body Weight:  56.8 kg  BMI:  Body mass index is 40.1 kg/m.  Estimated Nutritional Needs:  Kcal:  1900-2100 Protein:  110-125 grams Fluid:  > 2 L/day  06/20/21, RD, LDN Registered Dietitian After Hours/Weekend Pager # [phone]

## 2021-04-20 NOTE — Progress Notes (Addendum)
ANTICOAGULATION CONSULT NOTE  Pharmacy Consult for heparin > warfarin Indication:  Acute stroke  Allergies  Allergen Reactions  . Tape Rash    Cannot tolerate paper tape    Patient Measurements: Height: 5\' 5"  (165.1 cm) Weight: 109.3 kg (240 lb 15.4 oz) IBW/kg (Calculated) : 57  Heparin dosing weight: 72 kg    Vital Signs: Temp: 99.3 F (37.4 C) (05/09 1100) Temp Source: Oral (05/09 1100) BP: 123/83 (05/09 1100)  Labs: Recent Labs    04/18/21 0157 04/18/21 1354 04/19/21 0210 04/19/21 1442 04/20/21 0913 04/20/21 1259  HGB 10.7*  --  10.9*  --  11.0*  --   HCT 34.0*  --  33.8*  --  34.4*  --   PLT 269  --  262  --  283  --   LABPROT 16.6*  --  17.7*  --  22.2* 22.5*  INR 1.3*  --  1.5*  --  2.0* 2.0*  HEPARINUNFRC 0.23*   < > <0.10* 0.34 >1.10* >1.10*  CREATININE 0.61  --   --   --  0.57  --    < > = values in this interval not displayed.    Estimated Creatinine Clearance: 121.9 mL/min (by C-G formula based on SCr of 0.57 mg/dL).  Assessment: 35 y.o female on apixaban PTA for hx prior strokes, ?DVT.  Last dose PTA " unknown," admitted 04/02/21 with acute CVA. No TPA due to Apixaban pta.   -4/21 s/p thrombectomy. -Neurologist consulted pharmacist on 04/08/21 to start warfarin with goal INR 2.5-3.5 per Dr. 04/10/21.  Heparin level SUPRAtherapeutic but appears to have been drawn from the same arm heparin is infusing. Lab was asked to repeat the lab draw and still drew incorrectly - asking phlebotomy to draw the heparin level one additional time. In the meantime - will reduce the Heparin infusion to 1200 units/hr  INR up to 2 but remains below goal of 2.5-3.5 as requested by neurology.   Goal of Therapy:  INR 2.5-3.5 per Neurologist  Anti-Xa level 0.3-0.5  Monitor platelets by anticoagulation protocol: Yes   Plan:  - Reduce Heparin to 1200 units/hr in caution while awaiting repeat lab draw - delay and incorrect draws by phlebotomy x 2 today - Warfarin 6 mg x 1  dose at 1800 today - Stat repeat heparin level - phlebotomy instructed via phone to draw in opposite arm of heparin infusion, f/u INR in the AM  Thank you for allowing pharmacy to be a part of this patient's care.  Morrie Sheldon, PharmD, BCPS Clinical Pharmacist Clinical phone for 04/20/2021: [phone] 04/20/2021 2:38 PM   **Pharmacist phone directory can now be found on [email or web address] ([email or web address]).  Listed under [facility].  Addendum:  Heparin level drawn accurately this afternoon was therapeutic (HL 0.39, goal of 0.3-0.7). Will increase back up to 1300 units/hr and recheck in 6 hours to confirm rate appropriate.   [facility], PharmD, BCPS Pager: [phone] 4:37 PM

## 2021-04-20 NOTE — Progress Notes (Signed)
STROKE TEAM PROGRESS NOTE   INTERVAL HISTORY No acute events, No visitors at bedside. Attempting to speak today but not making comprehensible sounds. Other than this improvement she is at her neurologic baseline awaiting placement.  Afebrile.  Vital signs are stable.  Neurological exam is unchanged. Vitals:   04/20/21 0329 04/20/21 0441 04/20/21 0847 04/20/21 1100  BP:   123/81 123/83  Pulse:      Resp:      Temp: 98.8 F (37.1 C)   99.3 F (37.4 C)  TempSrc: Oral  Oral Oral  SpO2:      Weight:  109.3 kg    Height:       CBC:  Recent Labs  Lab 04/19/21 0210 04/20/21 0913  WBC 13.3* 10.1  HGB 10.9* 11.0*  HCT 33.8* 34.4*  MCV 90.4 90.1  PLT 262 283   Basic Metabolic Panel:  Recent Labs  Lab 04/18/21 0157 04/20/21 0913  NA 134* 138  K 3.3* 3.9  CL 105 108  CO2 21* 21*  GLUCOSE 124* 126*  BUN 19 19  CREATININE 0.61 0.57  CALCIUM 8.7* 9.0   IMAGING past 24 hours No results found.  PHYSICAL EXAM  Temp:  [98.8 F (37.1 C)-100 F (37.8 C)] 99.3 F (37.4 C) (05/09 1100) Pulse Rate:  [100-109] 100 (05/08 2149) Resp:  [20-22] 20 (05/08 1953) BP: (117-124)/(70-89) 123/83 (05/09 1100) SpO2:  [97 %] 97 % (05/08 1526) Weight:  [109.3 kg] 109.3 kg (05/09 0441)  General - Well nourished, well developed, in no apparent distress.  Resp: No extra work of breathing.  Cardiovascular - Regular rhythm and rate. Neuro - Awake alert eyes open, nonverbal, not following commands. Right gaze preference, barely cross midline.  Blinking to visual threat on the right but not on the left.  Left facial droop.  Left upper extremity flaccid, left LE knee flexion with pain at left foot  2/3. Hypohophonic vocalization with grunt which seemed to be attempt at speech. Right upper extremity spontaneous movement against gravity.  Right lower extremity at least 3/5 spontaneously. Increased muscle tone at LUE and LLE.    ASSESSMENT/PLAN [name] a 35 y.o.femalewith history of  multiple strokes who presented as a code stroke with symptoms suggestive of LVO. tPA was not given due to Eliquis. CTA head and neck revealed an occlusion of distal right M2 MCA with partial reconstitution and subsequent reocclusion of most M2 branches. Patient underwent thrombectomy.   Recurrent cryptogenic embolic strokes -Acute infarct of the R MCA due to right M1 occlusion s/p IR with TICI2c,cryptogenic etiology  Code Stroke:CT head No acuteintracranial hemorrhage.Acute infarcts in the right MCAterritory.ASPECTS8.   CTA head & neckOcclusion of distal right M1 MCA with partial reconstitution and subsequent reocclusion of most M2 branches. Poor collaterals.  CTPerfusion imaging demonstrates core infarction 99 mL and penumbra of 34 mL. The extent of core infarction is calculated at greater than apparent loss of gray-white differentiation on noncontrast head CT. Therefore, penumbra may be greater.  2D EchoLVEF 60-65%, no structural abnormalities noted that could increase risk for CVA  Repeat CT Head 4/24-large left MCA infarct, no midline shift. Small SAH sylvian fissure, diminishing in size  Hypercoagulable panel: Homocysteine 15.1 due to smoking, otherwise negative to date  LDL48  HgbA1c5.7  VTE prophylaxis -SCDs+ Lovenox  aspirin 325 mg daily and Eliquis (apixaban) dailyprior to admission, now on ASA 325 with heparin IV for PEG placement. Now transition back to coumadin with INR goal 2.5-3.5  PT-INR 1.0->1.2->1.5->2.2->3.0->2.7->1.7  Therapy recommendations:SNF  Disposition:Pending  Hx of cryptogenic strokes  In 08/2017 patient admitted for left sided numbness and headache.  MRI showed right cerebellum, right temporoparietal small infarcts.  EF 55 to 60%, CT head and neck unremarkable.  LDL 89, A1c 4.8.  TCD bubble study showed trivial PFO.  DVT negative.  TEE no PFO.  Hypercoagulable and autoimmune work-up negative.  Discharged with aspirin and  Lipitor.  01/2018 episode of left facial numbness and left leg weakness.  CT head and neck unremarkable.  MRI negative.  06/2018 admitted to [facility] for right hand/face numbness.  MRI showed right tiny parietal infarct.  10/2020 admitted to [facility] for aphasia status post tPA.  CTA neck showed left M2 occlusion.  MRI showed left MCA and punctate right frontal infarct.  TCD bubble study potential trivial PFO, TEE tiny PFO.  11/2020 she was referred to loop recorder which was not done as physician thought she should be candidate for anticoagulation.  Started on Eliquis 5 mg twice daily.  Possible shingles Negative PCR  Left flank skin lesion with blisters   Concerning for shingles  Varicella zoster PCR negative, spoke with IC RN and precautions discontinued 5/6.   Now trasnsferred to negative pressure room  Contact and airborne isolation  On valtrex discontinued  WOC on board for dressing  Leukocytosis, resolved  Low grade ?postop fever 100 5/5 overnight with slight increase in WBC to 12, monitor   WBC 11.7>9.8->10.0->10.9->10.0->12  UA negative  Tachycardia, resolved  Labtealol 100mg  BID  HR 90s  Hyperlipidemia  Home meds:atrovastatin 80mg  and zetia 10mg    Will restart post CT if stable  LDL 48, goal < 70  Resumed, on lipitor 80 and zetia 10  Continue statin at discharge  Dysphagia- DYS diet 1  Cortrack placementfor TF  SLP and RD following  Zero intake x 3 day calorie count on nocturnal feedings alone.   Restarted full tube feeding regimen  S/p PEG POD1: care per Dr. , need properly fitting abd binder which has been ordered and discussed with RN  Birth control  Recommend OP PCP or OBGYN discontinue Depo - provera due to numerous Contraindications including hx of tobacco use, migraines, and multiple strokes.  Tobacco abuse  Current smoker  Smoking cessation counselingwill beprovided  Contraindicated for birth control Depo  Hx  of pseudotumor cerebri Chronic migraine  Follows with Dr. 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  Other Stroke Risk Factors  MorbidObesity,Body mass index is 42.12 kg/m., BMI >/= 30 associated with increased stroke risk, recommend weight loss, diet and exercise as appropriate   Obstructive sleep apnea  Hospital day # 18   I have personally obtained history,examined this patient, reviewed notes, independently viewed imaging studies, participated in medical decision making and plan of care.ROS completed by me personally and pertinent positives fully documented  I have made any additions or clarifications directly to the above note. Agree with note above.  Continue ongoing treatment plan and therapy and rehab.  Medically stable to be transferred to rehab and skilled nursing facility when bed available.  Greater than 50% time during this 25-minute visit was spent on counseling and coordination of care about her cryptogenic stroke and discussion with care team.  [facility], MD Medical Director [facility] Pager: [phone] 04/20/2021 4:04 PM  04/20/2021 3:22 PM     To contact Stroke Continuity provider, please refer to [facility]. After  hours, contact General Neurolog

## 2021-04-21 DIAGNOSIS — I63511 Cerebral infarction due to unspecified occlusion or stenosis of right middle cerebral artery: Secondary | ICD-10-CM | POA: Diagnosis not present

## 2021-04-21 LAB — GLUCOSE, CAPILLARY
Glucose-Capillary: 124 mg/dL — ABNORMAL HIGH (ref 70–99)
Glucose-Capillary: 105 mg/dL — ABNORMAL HIGH (ref 70–99)
Glucose-Capillary: 86 mg/dL (ref 70–99)
Glucose-Capillary: 88 mg/dL (ref 70–99)
Glucose-Capillary: 95 mg/dL (ref 70–99)
Glucose-Capillary: 106 mg/dL — ABNORMAL HIGH (ref 70–99)

## 2021-04-21 LAB — CBC
HCT: 35.6 % — ABNORMAL LOW (ref 36.0–46.0)
Hemoglobin: 11.2 g/dL — ABNORMAL LOW (ref 12.0–15.0)
MCH: 28.9 pg (ref 26.0–34.0)
MCHC: 31.5 g/dL (ref 30.0–36.0)
MCV: 91.8 fL (ref 80.0–100.0)
Platelets: 334 10*3/uL (ref 150–400)
RBC: 3.88 MIL/uL (ref 3.87–5.11)
RDW: 15.4 % (ref 11.5–15.5)
WBC: 11.1 10*3/uL — ABNORMAL HIGH (ref 4.0–10.5)
nRBC: 0 % (ref 0.0–0.2)

## 2021-04-21 LAB — PROTIME-INR
INR: 2.5 — ABNORMAL HIGH (ref 0.8–1.2)
Prothrombin Time: 27 seconds — ABNORMAL HIGH (ref 11.4–15.2)

## 2021-04-21 LAB — HEPARIN LEVEL (UNFRACTIONATED): Heparin Unfractionated: 0.37 IU/mL (ref 0.30–0.70)

## 2021-04-21 MED ORDER — WARFARIN SODIUM 5 MG PO TABS
5.0000 mg | ORAL_TABLET | Freq: Once | ORAL | Status: AC
  Administered 2021-04-21: 5 mg
  Filled 2021-04-21: qty 1

## 2021-04-21 MED ORDER — WARFARIN SODIUM 5 MG PO TABS: 5.0000 mg | ORAL_TABLET | Freq: Once | ORAL | Status: DC

## 2021-04-21 NOTE — TOC Progression Note (Signed)
Transition of Care [facility]) - Progression Note    Patient Details  Name: Krista Maddox MRN: [id number] Date of Birth: Oct 28, 1986  Transition of Care [facility]) CM/SW Contact  Eduard Roux, [place] Phone Number: 04/21/2021, 3:29 PM  Clinical Narrative:     Received returned call -Patient's father selected [facility] CSW sent message to [facility] - waiting for SNF to confirm bed offer  Antony Blackbird, MSW, LCSW Clinical Social Worker    Expected Discharge Plan: Skilled Nursing Facility Barriers to Discharge: Continued Medical Work up,Insurance Authorization  Expected Discharge Plan and Services Expected Discharge Plan: Skilled Nursing Facility       Living arrangements for the past 2 months: Single Family Home                                       Social Determinants of Health (SDOH) Interventions    Readmission Risk Interventions No flowsheet data found.

## 2021-04-21 NOTE — Progress Notes (Addendum)
Physical Therapy Treatment Patient Details Name: Krista Maddox MRN: [id number] DOB: 05-14-1986 Today's Date: 04/21/2021    History of Present Illness The pt is a 35 yo female presenting with L sided facial droop and L sided weakness on 4/21. Underwent angiogram and revascularization on 4/21. S/p PEG 5/5. 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    PT Comments    Pt resting upon arrival to room, wakes with PT verbalization and tactile input. Pt requiring max-total +2 assist for bed mobility this session, suspect partially due to pt's difficulty with comprehension of verbal cuing as well as L hemiplegia. PT attending to L at EOB with max PT cuing. Pt verbalizing throughout session, repeating statement "I'm sorry" most of all. Pt tolerated EOB sitting x10 minutes with periods of min guard but when pt loses balance requires significant assist to correct. Pushing behaviors with RUE noted towards L, corrected with PT placing pt's RUE in her lap. PT to continue to follow acutely.    Follow Up Recommendations  SNF;Supervision/Assistance - 24 hour     Equipment Recommendations  None recommended by PT    Recommendations for Other Services       Precautions / Restrictions Precautions Precautions: Fall Precaution Comments: R gaze, L hemi, abdominal binder Required Braces or Orthoses: Other Brace Other Brace: L PRAFO Restrictions Weight Bearing Restrictions: No    Mobility  Bed Mobility Overal bed mobility: Needs Assistance Bed Mobility: Rolling;Sidelying to Sit;Sit to Supine Rolling: Max assist Sidelying to sit: Total assist   Sit to supine: Total assist;+2 for safety/equipment;+2 for physical assistance;HOB elevated   General bed mobility comments: max-total assist for supine<>sit for trunk and LE management, scooting to/from EOB, and boost up in bed. Pt resistant to RLE dangling over EOB, but improved when scooting to EOB.     Transfers                 General transfer comment: deferred  Ambulation/Gait                 Stairs             Wheelchair Mobility    Modified Rankin (Stroke Patients Only) Modified Rankin (Stroke Patients Only) Pre-Morbid Rankin Score: Moderate disability Modified Rankin: Severe disability     Balance Overall balance assessment: Needs assistance Sitting-balance support: Feet supported;Single extremity supported;No upper extremity supported Sitting balance-Leahy Scale: Poor Sitting balance - Comments: heavy L leaning with RUE pushing towards L, benefits from R hand in lap. EOB sitting x10 minutes, intermittent min-mod truncal assist to stay upright, PT focusing on getting pt to scan to L and maintain upright sitting. New abdominal binder donned in sitting, requiring +2 to maintain balance and adjust brace     Standing balance-Leahy Scale: Zero                              Cognition Arousal/Alertness: Awake/alert Behavior During Therapy: Restless Overall Cognitive Status: Difficult to assess                                 General Comments: Pt not following commands this session, resistant to mobility at times. Pt expressing "stomach (grimacing)", "I'm sorry", and nods yes/no today      Exercises General Exercises - Upper Extremity Shoulder Flexion: PROM;5 reps;Supine Shoulder Extension: PROM;Left;20 reps  Other Exercises Other Exercises: PROM into L cervical rotation to tolerance repeatedly sitting EOB; AROM x2 bouts when provided noxious stimuli to L ear or upper trap Other Exercises: PROM LUE; increased tone noted    General Comments        Pertinent Vitals/Pain Pain Assessment: Faces Faces Pain Scale: Hurts a little bit Pain Location: stomach Pain Descriptors / Indicators: Discomfort;Grimacing;Moaning Pain Intervention(s): Monitored during session    Home Living                      Prior Function             PT Goals (current goals can now be found in the care plan section) Acute Rehab PT Goals Patient Stated Goal: unable to state PT Goal Formulation: With patient Time For Goal Achievement: 04/29/21 Potential to Achieve Goals: Fair Progress towards PT goals: Progressing toward goals    Frequency    Min 3X/week      PT Plan Current plan remains appropriate;Frequency needs to be updated    Co-evaluation              AM-PAC PT "6 Clicks" Mobility   Outcome Measure  Help needed turning from your back to your side while in a flat bed without using bedrails?: Total Help needed moving from lying on your back to sitting on the side of a flat bed without using bedrails?: Total Help needed moving to and from a bed to a chair (including a wheelchair)?: Total Help needed standing up from a chair using your arms (e.g., wheelchair or bedside chair)?: Total Help needed to walk in hospital room?: Total Help needed climbing 3-5 steps with a railing? : Total 6 Click Score: 6    End of Session   Activity Tolerance: Patient tolerated treatment well;Patient limited by fatigue Patient left: in bed;with call bell/phone within reach;with bed alarm set;with SCD's reapplied;Other (comment) (L prafo donned, new bed pad under pt due to serosanguinous fluid on it, RN at bedside) Nurse Communication: Mobility status PT Visit Diagnosis: Hemiplegia and hemiparesis;Other abnormalities of gait and mobility (R26.89);Difficulty in walking, not elsewhere classified (R26.2);Other symptoms and signs involving the nervous system (R29.898);Muscle weakness (generalized) (M62.81) Hemiplegia - Right/Left: Left Hemiplegia - dominant/non-dominant: Non-dominant Hemiplegia - caused by: Cerebral infarction     Time: 1216-1242 PT Time Calculation (min) (ACUTE ONLY): 26 min  Charges:  $Therapeutic Activity: 8-22 mins $Neuromuscular Re-education: 8-22 mins                     Marye Round, PT DPT Acute  Rehabilitation Services Pager [phone]  Office [phone]   Truddie Coco 04/21/2021, 4:24 PM

## 2021-04-21 NOTE — Progress Notes (Signed)
  Speech Language Pathology Treatment: Dysphagia;Cognitive-Linquistic  Patient Details Name: Krista Maddox MRN: [id number] DOB: 1986-07-11 Today's Date: 04/21/2021 Time: 3016-0109 SLP Time Calculation (min) (ACUTE ONLY): 32 min  Assessment / Plan / Recommendation Clinical Impression  Therapy focused on functional activities for communicative-cognitive interaction with therapist. She made appropriate eye contact when therapist on right side and brought head close to midline on the left. Mod fading to min actile assist to initiate and continue combing her hair. Significantly resistant to oral care and therapist able to brush anterior dentition. No initiation when spoon placed in hand and resisting therapist attempts. SLP placed spoon to pudding to mouth and consistently turned away but reached for cup to consume 3-4 straw sip water sequences. Presently her PEG is malfunctioning and surgery was called however noted pt made NPO 5/4- therapist plans to initiate po's once more if able from GI/PEG standpoint. Coordination/respiration and swallow were consumed without indications of decreased airway protection.   Spontaneous phrases consisted of "I'm cold", "sorry" and several indecipherable phrases. She looked at picture of daughter with "Krista Maddox" at bottom and stated "Krista Maddox" followed by "my baby". Therapist used music to elicit automatic  speech with smiles and head bobbing without articulatory or phonatory attempts.    HPI HPI: Pt is a 35 y.o. female with a PMHx of prior strokes x 4 (right cerebellar and right posterior frontal infarctions in 2018 (while smoking and on oral contraceptives), as well as an acute occlusion of her left M2 on 11/09/2020 for which she was treated with thrombectomy at [facility] with sequelae of aphasia and left sided numbness), chronic MHAs, chronic AC with Eliquis, HLD, ? IIH, DDD, ITP during pregnancy, remote tobacco use, and disorder of optic nerve. Patient presented as a code  stroke due to right gaze preference, left sided weakness, aphasia and left facial droop. NA:TFTDD infarcts in the right MCA  territory; UKG:URKYHCWCB of distal right M1 MCA with partial reconstitution and  subsequent reocclusion of most M2 branches. Poor collaterals. Pt s/p revascularization of occluded RT MCA. Cotrak placed 4/22      SLP Plan  Continue with current plan of care       Recommendations  Diet recommendations: Thin liquid;Dysphagia 1 (puree) (after PEG is useable again) Liquids provided via: Straw Medication Administration: Via alternative means Supervision: Staff to assist with self feeding;Full supervision/cueing for compensatory strategies Compensations: Minimize environmental distractions Postural Changes and/or Swallow Maneuvers: Seated upright 90 degrees                Oral Care Recommendations: Oral care QID Follow up Recommendations: Skilled Nursing facility SLP Visit Diagnosis: Dysphagia, unspecified (R13.10);Cognitive communication deficit (R41.841);Aphasia (R47.01) Plan: Continue with current plan of care                       [name] 04/21/2021, 4:28 PM  [name] [name].Ed Nurse, children's [phone] Office [phone]

## 2021-04-21 NOTE — TOC Progression Note (Signed)
Transition of Care [facility]) - Progression Note    Patient Details  Name: Krista Maddox MRN: [id number] Date of Birth: 24-Nov-1986  [facility]) CM/SW Contact  Eduard Roux, [place] Phone Number: 04/21/2021, 5:38 PM  Clinical Narrative:     [facility] has confirmed bed offer and SNF will start insurance authorization.  Antony Blackbird, MSW, LCSW Clinical Social Worker   Expected Discharge Plan: Skilled Nursing Facility Barriers to Discharge: Continued Medical Work up,Insurance Authorization  Expected Discharge Plan and Services Expected Discharge Plan: Skilled Nursing Facility       Living arrangements for the past 2 months: Single Family Home                                       Social Determinants of Health (SDOH) Interventions    Readmission Risk Interventions No flowsheet data found.

## 2021-04-21 NOTE — TOC Progression Note (Signed)
Transition of Care [facility]) - Progression Note    Patient Details  Name: OPAL DINNING MRN: [id number] Date of Birth: 28-Nov-1986  Transition of Care [facility]) CM/SW Contact  Eduard Roux, [place] Phone Number: 04/21/2021, 2:38 PM  Clinical Narrative:     CSW called patient's father & Mother -left voice message to call CSW CSW needing SNF choice  CSW will continue to follow and assist with discharge planning.  Antony Blackbird, MSW, LCSW Clinical Social Worker   Expected Discharge Plan: Skilled Nursing Facility Barriers to Discharge: Continued Medical Work up,Insurance Authorization  Expected Discharge Plan and Services Expected Discharge Plan: Skilled Nursing Facility       Living arrangements for the past 2 months: Single Family Home                                       Social Determinants of Health (SDOH) Interventions    Readmission Risk Interventions No flowsheet data found.

## 2021-04-21 NOTE — Progress Notes (Signed)
Orthopedic Tech Progress Note Patient Details:  Krista Maddox 1986/05/08 [id number] RN called requesting a NEW ABDOMINAL BINDER, one patient had on was soiled.  Ortho Devices Type of Ortho Device: Abdominal binder Ortho Device/Splint Location: STOMACH Ortho Device/Splint Interventions: Other (comment)   Post Interventions Patient Tolerated: Well Instructions Provided: Care of device   [name] 04/21/2021, 1:31 PM

## 2021-04-21 NOTE — Progress Notes (Signed)
Occupational Therapy Treatment Patient Details Name: Krista Maddox MRN: [id number] DOB: [birth year] Today's Date: 04/21/2021    History of present illness The pt is a 35 yo female presenting with L sided facial droop and L sided weakness on 4/21. Underwent angiogram and revascularization on 4/21. S/p PEG 5/5. 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   OT comments  Pt progressing to letting mouth be washed out. Pt intermittently opening mouth to assist with teeth brushing with maxA for hand over hand assist. Pt performing HEP for LUE; remains flaccid. Pt totalA to scoot to [facility]. Pt verbalizing 2 words throughout ses o Pt would greatly benefit from continued OT skilled services. OT following acutely.   Follow Up Recommendations  SNF;Supervision/Assistance - 24 hour    Equipment Recommendations  3 in 1 bedside commode    Recommendations for Other Services      Precautions / Restrictions Precautions Precautions: Fall Precaution Comments: R gaze, L hemi, abdominal binder Required Braces or Orthoses: Other Brace Other Brace: L PRAFO Restrictions Weight Bearing Restrictions: No       Mobility Bed Mobility Overal bed mobility: Needs Assistance Bed Mobility: Rolling Rolling: Max assist         General bed mobility comments: Limited by +1 assist    Transfers                 General transfer comment: deferred    Balance                                           ADL either performed or assessed with clinical judgement   ADL Overall ADL's : Needs assistance/impaired     Grooming: Maximal assistance;Bed level Grooming Details (indicate cue type and reason): hand over hand assist for teeth brushing                             Functional mobility during ADLs: Total assistance;+2 for physical assistance General ADL Comments: Pt limited by R gaze preference making it to midline, but no  farther; decreased ability to follow commands and decreased ability to care for self.     Vision   Vision Assessment?: Vision impaired- to be further tested in functional context Additional Comments: R gaze prerference; L inattention   Perception     Praxis      Cognition Arousal/Alertness: Awake/alert Behavior During Therapy: Restless Overall Cognitive Status: Difficult to assess                                 General Comments: Pt verbalizing "that's enough" when teeth brushing for rinse and "sorry" several times        Exercises Exercises: General Lower Extremity;Other exercises General Exercises - Upper Extremity Shoulder Flexion: PROM;5 reps;Supine Shoulder Extension: PROM;Left;20 reps Other Exercises Other Exercises: PROM into L cervical rotation to tolerance repeatedly sitting EOB; AROM x2 bouts when provided noxious stimuli to L ear or upper trap Other Exercises: PROM LUE; increased tone noted   Shoulder Instructions       General Comments      Pertinent Vitals/ Pain       Pain Assessment: Faces Faces Pain Scale: Hurts a little bit Pain Descriptors / Indicators: Discomfort Pain Intervention(s):  Monitored during session  Home Living                                          Prior Functioning/Environment              Frequency  Min 2X/week        Progress Toward Goals  OT Goals(current goals can now be found in the care plan section)  Progress towards OT goals: Progressing toward goals  Acute Rehab OT Goals Patient Stated Goal: unable to state OT Goal Formulation: Patient unable to participate in goal setting Time For Goal Achievement: 05/01/21 Potential to Achieve Goals: Fair ADL Goals Pt/caregiver will Perform Home Exercise Program: Increased ROM;Increased strength;Both right and left upper extremity;With written HEP provided Additional ADL Goal #1: Pt will follow one step commands 10% of the time Additional  ADL Goal #2: Pt will turn head and maintain at midline or to left of midline for 30 seconds Additional ADL Goal #3: Pt will tolerate sitting EOB with total A for 10 min Additional ADL Goal #4: Continue to assess Pt for splinting needs for LUE  Plan Discharge plan remains appropriate;Frequency remains appropriate    Co-evaluation                 AM-PAC OT "6 Clicks" Daily Activity     Outcome Measure   Help from another person eating meals?: Total Help from another person taking care of personal grooming?: A Lot Help from another person toileting, which includes using toliet, bedpan, or urinal?: Total Help from another person bathing (including washing, rinsing, drying)?: Total Help from another person to put on and taking off regular upper body clothing?: Total Help from another person to put on and taking off regular lower body clothing?: Total 6 Click Score: 7    End of Session    OT Visit Diagnosis: Other abnormalities of gait and mobility (R26.89);Muscle weakness (generalized) (M62.81);Low vision, both eyes (H54.2);Other symptoms and signs involving cognitive function;Cognitive communication deficit (R41.841);Hemiplegia and hemiparesis Symptoms and signs involving cognitive functions: Cerebral infarction Hemiplegia - Right/Left: Left Hemiplegia - dominant/non-dominant: Non-Dominant Hemiplegia - caused by: Cerebral infarction   Activity Tolerance Patient limited by fatigue   Patient Left in bed;with call bell/phone within reach;with bed alarm set   Nurse Communication Mobility status        Time: 1050-1120 OT Time Calculation (min): 30 min  Charges: OT General Charges $OT Visit: 1 Visit OT Treatments $Self Care/Home Management : 8-22 mins $Therapeutic Exercise: 8-22 mins  Flora Lipps, OTR/L Acute Rehabilitation Services Pager: [phone] Office: [phone]    Timmy Cleverly C 04/21/2021, 2:58 PM

## 2021-04-21 NOTE — Progress Notes (Signed)
ANTICOAGULATION CONSULT NOTE  Pharmacy Consult for heparin > warfarin Indication:  Acute stroke  Allergies  Allergen Reactions  . Tape Rash    Cannot tolerate paper tape    Patient Measurements: Height: 5\' 5"  (165.1 cm) Weight: 109.3 kg (240 lb 15.4 oz) IBW/kg (Calculated) : 57  Heparin dosing weight: 72 kg    Vital Signs: Temp: 97.6 F (36.4 C) (05/10 0418) Temp Source: Axillary (05/10 0418) BP: 114/97 (05/10 0418) Pulse Rate: 99 (05/10 0418)  Labs: Recent Labs    04/19/21 0210 04/19/21 1442 04/20/21 0913 04/20/21 1259 04/20/21 1440 04/20/21 2259 04/21/21 0255  HGB 10.9*  --  11.0*  --   --   --  11.2*  HCT 33.8*  --  34.4*  --   --   --  35.6*  PLT 262  --  283  --   --   --  334  LABPROT 17.7*  --  22.2* 22.5*  --   --  27.0*  INR 1.5*  --  2.0* 2.0*  --   --  2.5*  HEPARINUNFRC <0.10*   < > >1.10* >1.10* 0.39 0.50 0.37  CREATININE  --   --  0.57  --   --   --   --    < > = values in this interval not displayed.    Estimated Creatinine Clearance: 121.9 mL/min (by C-G formula based on SCr of 0.57 mg/dL).  Assessment: 35 y.o female on apixaban PTA for hx prior strokes, ?DVT.  Last dose PTA " unknown," admitted 04/02/21 with acute CVA. No TPA due to Apixaban pta.   -4/21 s/p thrombectomy. -Neurologist consulted pharmacist on 04/08/21 to start warfarin with goal INR 2.5-3.5 per Dr. 04/10/21.  INR this morning is therapeutic and up to higher goal range recommended by neurology (INR 2.5, goal of 2.5-3.5). Will discontinue the heparin bridge. Morrie Sheldon was discussed with the neurology team and per Dr. [id number] the plan is to discontinue now that the INR is at goal.   Goal of Therapy:  INR 2.5-3.5 per Neurologist  Anti-Xa level 0.3-0.5  Monitor platelets by anticoagulation protocol: Yes   Plan:  - D/c Heparin - Warfarin 5 mg x 1 dose at 1600 today - Daily PT/INR, CBC q72h - Will continue to monitor for any signs/symptoms of bleeding and will follow up with PT/INR in  the a.m.   Thank you for allowing pharmacy to be a part of this patient's care.  Pearlean Brownie, PharmD, BCPS Clinical Pharmacist Clinical phone for 04/21/2021: [phone] 04/21/2021 7:11 AM   **Pharmacist phone directory can now be found on [email or web address] ([email or web address]).  Listed under [facility].

## 2021-04-21 NOTE — Progress Notes (Signed)
Progress Note  5 Days Post-Op  Subjective: CC: NAEON. At baseline per nursing. Afebrile. Very small interval increase in WBC overnight.   Objective: Vital signs in last 24 hours: Temp:  [97.6 F (36.4 C)-99.1 F (37.3 C)] 98.6 F (37 C) (05/10 1607) Pulse Rate:  [93-104] 99 (05/10 0418) Resp:  [20] 20 (05/10 1607) BP: (105-133)/(57-97) 106/82 (05/10 1607) SpO2:  [96 %-98 %] 98 % (05/10 0418) Weight:  [109.3 kg] 109.3 kg (05/10 0500) Last BM Date: 04/19/21  Intake/Output from previous day: 05/09 0701 - 05/10 0700 In: 567 [I.V.:117; NG/GT:450] Out: 2600 [Urine:2600] Intake/Output this shift: Total I/O In: -  Out: 500 [Urine:500]  PE: General:  NAD HEENT: head is normocephalic, atraumatic. Mouth is pink and moist Abd: soft, nontender diffusely with local tenderness to palpation at PEG site. No guarding or rigidity Skin: warm and dry - PEG site with local erythema and minimal induration around to the circumference of the PEG bumper. No palpable fluid collection. Some bloody discharge from site. PEG tubing in appropriate position and rotates easily. Flushes and withdraws easily Neuro: alert, minimally verbal. At baseline Psych: at baseline     Lab Results:  Recent Labs    04/20/21 0913 04/21/21 0255  WBC 10.1 11.1*  HGB 11.0* 11.2*  HCT 34.4* 35.6*  PLT 283 334   BMET Recent Labs    04/20/21 0913  NA 138  K 3.9  CL 108  CO2 21*  GLUCOSE 126*  BUN 19  CREATININE 0.57  CALCIUM 9.0   PT/INR Recent Labs    04/20/21 1259 04/21/21 0255  LABPROT 22.5* 27.0*  INR 2.0* 2.5*   CMP     Component Value Date/Time   NA 138 04/20/2021 0913   NA 141 07/26/2018 1610   K 3.9 04/20/2021 0913   CL 108 04/20/2021 0913   CO2 21 (L) 04/20/2021 0913   GLUCOSE 126 (H) 04/20/2021 0913   BUN 19 04/20/2021 0913   BUN 10 07/26/2018 1610   CREATININE 0.57 04/20/2021 0913   CALCIUM 9.0 04/20/2021 0913   PROT 7.0 04/02/2021 1203   PROT 7.1 07/26/2018 1610    ALBUMIN 3.3 (L) 04/02/2021 1203   ALBUMIN 4.1 07/26/2018 1610   AST 18 04/02/2021 1203   ALT 14 04/02/2021 1203   ALKPHOS 88 04/02/2021 1203   BILITOT 0.7 04/02/2021 1203   BILITOT <0.2 07/26/2018 1610   GFRNONAA >60 04/20/2021 0913   GFRAA 109 07/26/2018 1610   Lipase     Component Value Date/Time   LIPASE 17 02/26/2012 1926       Studies/Results: No results found.  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 Janee Morn on 5/5 - Recommend good site care with frequent dressing changes for discharge or saturation. Continue to monitor tube feeds for leaking and flush as ordered. - no acute surgical needs at this time - will recheck in the am.    LOS: 19 days    Krista Maddox  [facility], [facility] 04/21/2021, 4:23 PM Please see Amion for pager number during day hours 7:00am-4:30pm

## 2021-04-21 NOTE — Progress Notes (Signed)
STROKE TEAM PROGRESS NOTE   INTERVAL HISTORY No acute events, No visitors at bedside. Attempting to speak today but not making comprehensible sounds.  No new changes.  She is at her neurologic baseline awaiting placement.  Afebrile.  Vital signs are stable.  Neurological exam is unchanged.  WBC and hematocrit stable.  INR is optimal today and heparin drip will be discontinued Vitals:   04/21/21 0418 04/21/21 0500 04/21/21 0700 04/21/21 1100  BP: (!) 114/97  119/75 (!) 106/57  Pulse: 99     Resp: 20  20 20   Temp: 97.6 F (36.4 C)  99.1 F (37.3 C) 98.5 F (36.9 C)  TempSrc: Axillary  Oral Oral  SpO2: 98%     Weight:  109.3 kg    Height:       CBC:  Recent Labs  Lab 04/20/21 0913 04/21/21 0255  WBC 10.1 11.1*  HGB 11.0* 11.2*  HCT 34.4* 35.6*  MCV 90.1 91.8  PLT 283 334   Basic Metabolic Panel:  Recent Labs  Lab 04/18/21 0157 04/20/21 0913  NA 134* 138  K 3.3* 3.9  CL 105 108  CO2 21* 21*  GLUCOSE 124* 126*  BUN 19 19  CREATININE 0.61 0.57  CALCIUM 8.7* 9.0   IMAGING past 24 hours No results found.  PHYSICAL EXAM  Temp:  [97.6 F (36.4 C)-99.1 F (37.3 C)] 98.5 F (36.9 C) (05/10 1100) Pulse Rate:  [93-104] 99 (05/10 0418) Resp:  [20] 20 (05/10 1100) BP: (105-133)/(57-97) 106/57 (05/10 1100) SpO2:  [96 %-98 %] 98 % (05/10 0418) Weight:  [109.3 kg] 109.3 kg (05/10 0500)  General -obese young Caucasian lady, in no apparent distress.  Resp: No extra work of breathing.  Cardiovascular - Regular rhythm and rate. Neuro - Awake alert eyes open, nonverbal, not following commands. Right gaze preference, barely cross midline.  Blinking to visual threat on the right but not on the left.  Left facial droop.  Left upper extremity flaccid, left LE knee flexion with pain at left foot  2/3. Hypohophonic vocalization with grunt which seemed to be attempt at speech. Right upper extremity spontaneous movement against gravity.  Right lower extremity at least 3/5 spontaneously.  Increased muscle tone at LUE and LLE.    ASSESSMENT/PLAN Krista L Matthewsis a 35 y.o.femalewith history of multiple strokes who presented as a code stroke with symptoms suggestive of LVO. tPA was not given due to Eliquis. CTA head and neck revealed an occlusion of distal right M2 MCA with partial reconstitution and subsequent reocclusion of most M2 branches. Patient underwent thrombectomy.   Recurrent cryptogenic embolic strokes -Acute infarct of the R MCA due to right M1 occlusion s/p IR with TICI2c,cryptogenic etiology  Code Stroke:CT head No acuteintracranial hemorrhage.Acute infarcts in the right MCAterritory.ASPECTS8.   CTA head & neckOcclusion of distal right M1 MCA with partial reconstitution and subsequent reocclusion of most M2 branches. Poor collaterals.  CTPerfusion imaging demonstrates core infarction 99 mL and penumbra of 34 mL. The extent of core infarction is calculated at greater than apparent loss of gray-white differentiation on noncontrast head CT. Therefore, penumbra may be greater.  2D EchoLVEF 60-65%, no structural abnormalities noted that could increase risk for CVA  Repeat CT Head 4/24-large left MCA infarct, no midline shift. Small SAH sylvian fissure, diminishing in size  Hypercoagulable panel: Homocysteine 15.1 due to smoking, otherwise negative to date  LDL48  HgbA1c5.7  VTE prophylaxis -SCDs+ Lovenox  aspirin 325 mg daily and Eliquis (apixaban) dailyprior to admission, now  on ASA 325 with heparin IV for PEG placement. Now transition back to coumadin with INR goal 2.5-3.5  PT-INR 1.0->1.2->1.5->2.2->3.0->2.7->1.7  Therapy recommendations:SNF  Disposition:Pending  Hx of cryptogenic strokes  In 08/2017 patient admitted for left sided numbness and headache.  MRI showed right cerebellum, right temporoparietal small infarcts.  EF 55 to 60%, CT head and neck unremarkable.  LDL 89, A1c 4.8.  TCD bubble study showed trivial PFO.   DVT negative.  TEE no PFO.  Hypercoagulable and autoimmune work-up negative.  Discharged with aspirin and Lipitor.  01/2018 episode of left facial numbness and left leg weakness.  CT head and neck unremarkable.  MRI negative.  06/2018 admitted to [facility] for right hand/face numbness.  MRI showed right tiny parietal infarct.  10/2020 admitted to [facility] for aphasia status post tPA.  CTA neck showed left M2 occlusion.  MRI showed left MCA and punctate right frontal infarct.  TCD bubble study potential trivial PFO, TEE tiny PFO.  11/2020 she was referred to loop recorder which was not done as physician thought she should be candidate for anticoagulation.  Started on Eliquis 5 mg twice daily.  Possible shingles Negative PCR  Left flank skin lesion with blisters   Concerning for shingles  Varicella zoster PCR negative, spoke with IC RN and precautions discontinued 5/6.   Now trasnsferred to negative pressure room  Contact and airborne isolation  On valtrex discontinued  WOC on board for dressing  Leukocytosis, resolved  Low grade ?postop fever 100 5/5 overnight with slight increase in WBC to 12, monitor   WBC 11.7>9.8->10.0->10.9->10.0->12  UA negative  Tachycardia, resolved  Labtealol 100mg  BID  HR 90s  Hyperlipidemia  Home meds:atrovastatin 80mg  and zetia 10mg    Will restart post CT if stable  LDL 48, goal < 70  Resumed, on lipitor 80 and zetia 10  Continue statin at discharge  Dysphagia- DYS diet 1  Cortrack placementfor TF  SLP and RD following  Zero intake x 3 day calorie count on nocturnal feedings alone.   Restarted full tube feeding regimen  S/p PEG POD1: care per Dr. , need properly fitting abd binder which has been ordered and discussed with RN  Birth control  Recommend OP PCP or OBGYN discontinue Depo - provera due to numerous Contraindications including hx of tobacco use, migraines, and multiple strokes.  Tobacco  abuse  Current smoker  Smoking cessation counselingwill beprovided  Contraindicated for birth control Depo  Hx of pseudotumor cerebri Chronic migraine  Follows with Dr. 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  Other Stroke Risk Factors  MorbidObesity,Body mass index is 42.12 kg/m., BMI >/= 30 associated with increased stroke risk, recommend weight loss, diet and exercise as appropriate   Obstructive sleep apnea  Hospital day # 19   Discontinue IV heparin drip as INR is optimal at 2.5 today.  Discussed with pharmacist discontinue aspirin also.  Above.  Continue ongoing treatment plan and therapy and rehab.  Medically stable to be transferred to rehab and skilled nursing facility when bed available.  Greater than 50% time during this 25-minute visit was spent on counseling and coordination of care about her cryptogenic stroke and discussion with care team.  Terrace Arabia, MD Medical Director [facility] Pager: [phone] 04/21/2021 1:49 PM  04/21/2021 1:49 PM     To contact Stroke Continuity provider, please refer to [facility]. After hours, contact General Neurolog

## 2021-04-21 NOTE — Progress Notes (Signed)
ANTICOAGULATION CONSULT NOTE  Pharmacy Consult for heparin > warfarin Indication:  Acute stroke  Allergies  Allergen Reactions  . Tape Rash    Cannot tolerate paper tape    Patient Measurements: Height: 5\' 5"  (165.1 cm) Weight: 109.3 kg (240 lb 15.4 oz) IBW/kg (Calculated) : 57  Heparin dosing weight: 72 kg    Vital Signs: Temp: 97.8 F (36.6 C) (05/09 2330) Temp Source: Oral (05/09 2330) BP: 128/73 (05/09 2330) Pulse Rate: 93 (05/09 2330)  Labs: Recent Labs    04/18/21 0157 04/18/21 1354 04/19/21 0210 04/19/21 1442 04/20/21 0913 04/20/21 1259 04/20/21 1440 04/20/21 2259  HGB 10.7*  --  10.9*  --  11.0*  --   --   --   HCT 34.0*  --  33.8*  --  34.4*  --   --   --   PLT 269  --  262  --  283  --   --   --   LABPROT 16.6*  --  17.7*  --  22.2* 22.5*  --   --   INR 1.3*  --  1.5*  --  2.0* 2.0*  --   --   HEPARINUNFRC 0.23*   < > <0.10*   < > >1.10* >1.10* 0.39 0.50  CREATININE 0.61  --   --   --  0.57  --   --   --    < > = values in this interval not displayed.    Estimated Creatinine Clearance: 121.9 mL/min (by C-G formula based on SCr of 0.57 mg/dL).  Assessment: 35 y.o female on apixaban PTA for hx prior strokes, ?DVT.  Last dose PTA " unknown," admitted 04/02/21 with acute CVA. No TPA due to Apixaban pta.   -4/21 s/p thrombectomy. -Neurologist consulted pharmacist on 04/08/21 to start warfarin with goal INR 2.5-3.5 per Dr. 04/10/21.  INR up to 2 but remains below goal of 2.5-3.5 as requested by neurology.   5/10 AM update:  Heparin level therapeutic x 2  Goal of Therapy:  INR 2.5-3.5 per Neurologist  Anti-Xa level 0.3-0.5  Monitor platelets by anticoagulation protocol: Yes   Plan:  -Cont heparin at 1300 units/hr -Daily CBC/heparin level -Monitor for bleeding  Morrie Sheldon, PharmD, BCPS Clinical Pharmacist Phone: [phone]

## 2021-04-22 DIAGNOSIS — I63511 Cerebral infarction due to unspecified occlusion or stenosis of right middle cerebral artery: Secondary | ICD-10-CM | POA: Diagnosis not present

## 2021-04-22 LAB — CBC
HCT: 37.8 % (ref 36.0–46.0)
Hemoglobin: 11.8 g/dL — ABNORMAL LOW (ref 12.0–15.0)
MCH: 28.4 pg (ref 26.0–34.0)
MCHC: 31.2 g/dL (ref 30.0–36.0)
MCV: 90.9 fL (ref 80.0–100.0)
Platelets: 320 10*3/uL (ref 150–400)
RBC: 4.16 MIL/uL (ref 3.87–5.11)
RDW: 15.2 % (ref 11.5–15.5)
WBC: 8.9 10*3/uL (ref 4.0–10.5)
nRBC: 0 % (ref 0.0–0.2)

## 2021-04-22 LAB — BASIC METABOLIC PANEL
Anion gap: 11 (ref 5–15)
BUN: 24 mg/dL — ABNORMAL HIGH (ref 6–20)
CO2: 22 mmol/L (ref 22–32)
Calcium: 9.6 mg/dL (ref 8.9–10.3)
Chloride: 104 mmol/L (ref 98–111)
Creatinine, Ser: 0.65 mg/dL (ref 0.44–1.00)
GFR, Estimated: >60 mL/min (ref >60)
Glucose, Bld: 98 mg/dL (ref 70–99)
Potassium: 4.2 mmol/L (ref 3.5–5.1)
Sodium: 137 mmol/L (ref 135–145)

## 2021-04-22 LAB — PROTIME-INR
INR: 1.7 — ABNORMAL HIGH (ref 0.8–1.2)
INR: 1.7 — ABNORMAL HIGH (ref 0.8–1.2)
INR: 1.7 — ABNORMAL HIGH (ref 0.8–1.2)
Prothrombin Time: 19.9 seconds — ABNORMAL HIGH (ref 11.4–15.2)
Prothrombin Time: 20.1 seconds — ABNORMAL HIGH (ref 11.4–15.2)
Prothrombin Time: 19.6 seconds — ABNORMAL HIGH (ref 11.4–15.2)

## 2021-04-22 LAB — GLUCOSE, CAPILLARY
Glucose-Capillary: 105 mg/dL — ABNORMAL HIGH (ref 70–99)
Glucose-Capillary: 106 mg/dL — ABNORMAL HIGH (ref 70–99)
Glucose-Capillary: 117 mg/dL — ABNORMAL HIGH (ref 70–99)
Glucose-Capillary: 134 mg/dL — ABNORMAL HIGH (ref 70–99)
Glucose-Capillary: 87 mg/dL (ref 70–99)
Glucose-Capillary: 93 mg/dL (ref 70–99)

## 2021-04-22 LAB — SARS CORONAVIRUS 2 (TAT 6-24 HRS): SARS Coronavirus 2: NEGATIVE

## 2021-04-22 LAB — HEPARIN LEVEL (UNFRACTIONATED): Heparin Unfractionated: 0.44 IU/mL (ref 0.30–0.70)

## 2021-04-22 MED ORDER — LIDOCAINE HCL (PF) 1 % IJ SOLN
20.0000 mL | INTRAMUSCULAR | Status: DC | PRN
  Filled 2021-04-22: qty 30

## 2021-04-22 MED ORDER — HEPARIN (PORCINE) 25000 UT/250ML-% IV SOLN
1000.0000 [IU]/h | INTRAVENOUS | Status: DC
  Administered 2021-04-22 – 2021-04-23 (×2): 1300 [IU]/h via INTRAVENOUS
  Administered 2021-04-24: 1150 [IU]/h via INTRAVENOUS
  Filled 2021-04-22 (×4): qty 250

## 2021-04-22 MED ORDER — SODIUM CHLORIDE 0.9% FLUSH: 10.0000 mL | INTRAVENOUS | Status: DC | PRN

## 2021-04-22 MED ORDER — SODIUM CHLORIDE 0.9% FLUSH
10.0000 mL | Freq: Two times a day (BID) | INTRAVENOUS | Status: DC
  Administered 2021-04-22 – 2021-05-15 (×42): 10 mL

## 2021-04-22 MED ORDER — WARFARIN SODIUM 5 MG PO TABS
10.0000 mg | ORAL_TABLET | Freq: Once | ORAL | Status: AC
  Administered 2021-04-22: 10 mg
  Filled 2021-04-22: qty 2

## 2021-04-22 NOTE — Progress Notes (Signed)
ANTICOAGULATION CONSULT NOTE  Pharmacy Consult for heparin > warfarin Indication:  Acute stroke  Allergies  Allergen Reactions  . Tape Rash    Cannot tolerate paper tape    Patient Measurements: Height: 5\' 5"  (165.1 cm) Weight: 109.3 kg (240 lb 15.4 oz) IBW/kg (Calculated) : 57  Heparin dosing weight: 72 kg    Vital Signs: Temp: 99.8 F (37.7 C) (05/11 0730) Temp Source: Axillary (05/11 0730) BP: 114/76 (05/11 0730) Pulse Rate: 95 (05/11 0730)  Labs: Recent Labs    04/20/21 0913 04/20/21 1259 04/20/21 1440 04/20/21 2259 04/21/21 0255 04/22/21 0503 04/22/21 0825  HGB 11.0*  --   --   --  11.2* 11.8*  --   HCT 34.4*  --   --   --  35.6* 37.8  --   PLT 283  --   --   --  334 320  --   LABPROT 22.2*   < >  --   --  27.0* 19.9* 20.1*  INR 2.0*   < >  --   --  2.5* 1.7* 1.7*  HEPARINUNFRC >1.10*   < > 0.39 0.50 0.37  --   --   CREATININE 0.57  --   --   --   --  0.65  --    < > = values in this interval not displayed.    Estimated Creatinine Clearance: 121.9 mL/min (by C-G formula based on SCr of 0.65 mg/dL).  Assessment: 35 y.o female on apixaban PTA for hx prior strokes, ?DVT.  Last dose PTA " unknown," admitted 04/02/21 with acute CVA. No TPA due to Apixaban pta.   -4/21 s/p thrombectomy. -Neurologist consulted pharmacist on 04/08/21 to start warfarin with goal INR 2.5-3.5 per Dr. 04/10/21.  INR today dropped and is SUBtherapeutic (INR 1.7 << 2.5, goal of 2.5-3.5). Discussed with neuro Morrie Sheldon) and will need to resume the Heparin drip until the INR is back up to goal.   Goal of Therapy:  INR 2.5-3.5 per Neurologist  Anti-Xa level 0.3-0.5  Monitor platelets by anticoagulation protocol: Yes   Plan:  - Restart Heparin at 1300 units/hr - Warfarin 10 mg x 1 dose at 1600 today - Daily PT/INR, CBC q72h - Will continue to monitor for any signs/symptoms of bleeding and will follow up with heparin level in 6 hours and PT/INR in the AM   Thank you for allowing pharmacy  to be a part of this patient's care.  Pearlean Brownie, PharmD, BCPS Clinical Pharmacist Clinical phone for 04/22/2021: 06/22/2021 04/22/2021 10:33 AM   **Pharmacist phone directory can now be found on [email or web address] ([email or web address]).  Listed under [facility].

## 2021-04-22 NOTE — Progress Notes (Signed)
STROKE TEAM PROGRESS NOTE   INTERVAL HISTORY INR 1.7 this am and verified on repeat draw kindly obtained by pharmacist. Will have to restart heparin drip. Transient fever last pm 101. WBC improved at 8.9.  Neuro exam stable.  Vitals:   04/22/21 0300 04/22/21 0500 04/22/21 0730 04/22/21 1132  BP: 105/75  114/76 111/79  Pulse: 90  95 98  Resp: 18  18 20   Temp: (!) 101.1 F (38.4 C)  99.8 F (37.7 C) 97.7 F (36.5 C)  TempSrc: Axillary  Axillary Oral  SpO2: 99%  98% 98%  Weight:  109.3 kg    Height:       CBC:  Recent Labs  Lab 04/21/21 0255 04/22/21 0503  WBC 11.1* 8.9  HGB 11.2* 11.8*  HCT 35.6* 37.8  MCV 91.8 90.9  PLT 334 320   Basic Metabolic Panel:  Recent Labs  Lab 04/20/21 0913 04/22/21 0503  NA 138 137  K 3.9 4.2  CL 108 104  CO2 21* 22  GLUCOSE 126* 98  BUN 19 24*  CREATININE 0.57 0.65  CALCIUM 9.0 9.6   IMAGING past 24 hours No results found.  PHYSICAL EXAM  Temp:  [97.7 F (36.5 C)-101.1 F (38.4 C)] 97.7 F (36.5 C) (05/11 1132) Pulse Rate:  [88-101] 98 (05/11 1132) Resp:  [17-20] 20 (05/11 1132) BP: (105-124)/(60-82) 111/79 (05/11 1132) SpO2:  [97 %-99 %] 98 % (05/11 1132) Weight:  [109.3 kg] 109.3 kg (05/11 0500)  General -obese young Caucasian lady, in no apparent distress.  Resp: No extra work of breathing.  Cardiovascular - Regular rhythm and rate. Neuro - Awake alert eyes open, nonverbal, not following commands. Right gaze preference, barely cross midline.  Blinking to visual threat on the right but not on the left.  Left facial droop.  Left upper extremity flaccid, left LE knee flexion with pain at left foot  2/3. Hypohophonic vocalization with grunt which seemed to be attempt at speech. Right upper extremity spontaneous movement against gravity.  Right lower extremity at least 3/5 spontaneously. Increased muscle tone at LUE and LLE.    ASSESSMENT/PLAN [name] a 35 y.o.femalewith history of multiple strokes who  presented as a code stroke with symptoms suggestive of LVO. tPA was not given due to Eliquis. CTA head and neck revealed an occlusion of distal right M2 MCA with partial reconstitution and subsequent reocclusion of most M2 branches. Patient underwent thrombectomy.   Recurrent cryptogenic embolic strokes -Acute infarct of the R MCA due to right M1 occlusion s/p IR with TICI2c,cryptogenic etiology  Code Stroke:CT head No acuteintracranial hemorrhage.Acute infarcts in the right MCAterritory.ASPECTS8.   CTA head & neckOcclusion of distal right M1 MCA with partial reconstitution and subsequent reocclusion of most M2 branches. Poor collaterals.  CTPerfusion imaging demonstrates core infarction 99 mL and penumbra of 34 mL. The extent of core infarction is calculated at greater than apparent loss of gray-white differentiation on noncontrast head CT. Therefore, penumbra may be greater.  2D EchoLVEF 60-65%, no structural abnormalities noted that could increase risk for CVA  Repeat CT Head 4/24-large left MCA infarct, no midline shift. Small SAH sylvian fissure, diminishing in size  Hypercoagulable panel: Homocysteine 15.1 due to smoking, otherwise negative to date  LDL48  HgbA1c5.7  VTE prophylaxis -SCDs+ Lovenox  aspirin 325 mg daily and Eliquis (apixaban) dailyprior to admission, now on ASA 325 with heparin IV for PEG placement. Now transition back to coumadin with INR goal 2.5-3.5  PT-INR 1.0->1.2->1.5->2.2->3.0->2.7->1.7  Therapy recommendations:SNF  Disposition:Pending  Hx of cryptogenic strokes  In 08/2017 patient admitted for left sided numbness and headache.  MRI showed right cerebellum, right temporoparietal small infarcts.  EF 55 to 60%, CT head and neck unremarkable.  LDL 89, A1c 4.8.  TCD bubble study showed trivial PFO.  DVT negative.  TEE no PFO.  Hypercoagulable and autoimmune work-up negative.  Discharged with aspirin and Lipitor.  01/2018 episode of  left facial numbness and left leg weakness.  CT head and neck unremarkable.  MRI negative.  06/2018 admitted to [facility] for right hand/face numbness.  MRI showed right tiny parietal infarct.  10/2020 admitted to [facility] for aphasia status post tPA.  CTA neck showed left M2 occlusion.  MRI showed left MCA and punctate right frontal infarct.  TCD bubble study potential trivial PFO, TEE tiny PFO.  11/2020 she was referred to loop recorder which was not done as physician thought she should be candidate for anticoagulation.  Started on Eliquis 5 mg twice daily.  Possible shingles Negative PCR  Left flank skin lesion with blisters   Concerning for shingles  Varicella zoster PCR negative, spoke with IC RN and precautions discontinued 5/6.   Now trasnsferred to negative pressure room  Contact and airborne isolation  On valtrex discontinued  WOC on board for dressing  Leukocytosis, resolved  Low grade ?postop fever 100 5/5 overnight with slight increase in WBC to 12, monitor   WBC 11.7>9.8->10.0->10.9->10.0->12  UA negative  Tachycardia, resolved  Labtealol 100mg  BID  HR 90s  Hyperlipidemia  Home meds:atrovastatin 80mg  and zetia 10mg    Will restart post CT if stable  LDL 48, goal < 70  Resumed, on lipitor 80 and zetia 10  Continue statin at discharge  Dysphagia- DYS diet 1  Cortrack placementfor TF  SLP and RD following  Zero intake x 3 day calorie count on nocturnal feedings alone.   Restarted full tube feeding regimen  S/p PEG POD1: care per Dr. , need properly fitting abd binder which has been ordered and discussed with RN  Birth control  Recommend OP PCP or OBGYN discontinue Depo - provera due to numerous Contraindications including hx of tobacco use, migraines, and multiple strokes.  Tobacco abuse  Current smoker  Smoking cessation counselingwill beprovided  Contraindicated for birth control Depo  Hx of pseudotumor  cerebri Chronic migraine  Follows with Dr. 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  Other Stroke Risk Factors  MorbidObesity,Body mass index is 42.12 kg/m., BMI >/= 30 associated with increased stroke risk, recommend weight loss, diet and exercise as appropriate   Obstructive sleep apnea  Hospital day # 20   Restart IV heparin drip as INR is suboptimal at 1.7 today.  Discussed with pharmacist . .  Continue ongoing treatment plan and therapy and rehab.  Medically stable to be transferred to rehab and skilled nursing facility when bed available.  Greater than 50% time during this 25-minute visit was spent on counseling and coordination of care about her cryptogenic stroke and discussion with care team.  Terrace Arabia, MD Medical Director [facility] Pager: [phone] 04/22/2021 2:23 PM  04/22/2021 2:23 PM     To contact Stroke Continuity provider, please refer to [facility]. After hours, contact General Neurolog

## 2021-04-22 NOTE — Progress Notes (Signed)
Progress Note  6 Days Post-Op  Subjective: CC: NAEON. At baseline. Afebrile. No acute distress. Leukocytosis resolved   Objective: Vital signs in last 24 hours: Temp:  [97.7 F (36.5 C)-101.1 F (38.4 C)] 97.7 F (36.5 C) (05/11 1132) Pulse Rate:  [88-101] 98 (05/11 1132) Resp:  [17-20] 20 (05/11 1132) BP: (105-124)/(60-82) 111/79 (05/11 1132) SpO2:  [97 %-99 %] 98 % (05/11 1132) Weight:  [109.3 kg] 109.3 kg (05/11 0500) Last BM Date: 04/19/21  Intake/Output from previous day: 05/10 0701 - 05/11 0700 In: -  Out: 1500 [Urine:1500] Intake/Output this shift: Total I/O In: -  Out: 300 [Urine:300]  PE: General:  NAD HEENT: head is normocephalic, atraumatic. Mouth is pink and moist Abd: soft, nontender diffusely with local tenderness to palpation at PEG site. No guarding or rigidity Skin: warm and dry - PEG site with local erythema and minimal induration around to the circumference of the PEG bumper. No palpable fluid collection. PEG tubing in appropriate position and rotates easily. Flushes and withdraws easily. From yesterday to today there is some increased bloody purulence from site Neuro: alert, minimally verbal. At baseline Psych: at baseline    Lab Results:  Recent Labs    04/21/21 0255 04/22/21 0503  WBC 11.1* 8.9  HGB 11.2* 11.8*  HCT 35.6* 37.8  PLT 334 320   BMET Recent Labs    04/20/21 0913 04/22/21 0503  NA 138 137  K 3.9 4.2  CL 108 104  CO2 21* 22  GLUCOSE 126* 98  BUN 19 24*  CREATININE 0.57 0.65  CALCIUM 9.0 9.6   PT/INR Recent Labs    04/22/21 0825 04/22/21 1140  LABPROT 20.1* 19.6*  INR 1.7* 1.7*   CMP     Component Value Date/Time   NA 137 04/22/2021 0503   NA 141 07/26/2018 1610   K 4.2 04/22/2021 0503   CL 104 04/22/2021 0503   CO2 22 04/22/2021 0503   GLUCOSE 98 04/22/2021 0503   BUN 24 (H) 04/22/2021 0503   BUN 10 07/26/2018 1610   CREATININE 0.65 04/22/2021 0503   CALCIUM 9.6 04/22/2021 0503   PROT 7.0  04/02/2021 1203   PROT 7.1 07/26/2018 1610   ALBUMIN 3.3 (L) 04/02/2021 1203   ALBUMIN 4.1 07/26/2018 1610   AST 18 04/02/2021 1203   ALT 14 04/02/2021 1203   ALKPHOS 88 04/02/2021 1203   BILITOT 0.7 04/02/2021 1203   BILITOT <0.2 07/26/2018 1610   GFRNONAA >60 04/22/2021 0503   GFRAA 109 07/26/2018 1610   Lipase     Component Value Date/Time   LIPASE 17 02/26/2012 1926       Studies/Results: No results found.  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 Janee Morn on 5/5 - Recommend good site care with frequent dressing changes for discharge or saturation. Continue to monitor tube feeds for leaking and flush as ordered. - consider bedside incision and drainage today or tomorrow    LOS: 20  days    Eric Form, [facility] 04/22/2021, 3:30 PM Please see Amion for pager number during day hours 7:00am-4:30pm

## 2021-04-22 NOTE — TOC Progression Note (Addendum)
Transition of Care [facility]) - Progression Note    Patient Details  Name: Krista Maddox MRN: [id number] Date of Birth: 1986/01/26  Transition of Care [facility]) CM/SW Contact  Gildardo Griffes, [place] Phone Number: 04/22/2021, 11:25 AM  Clinical Narrative:     2:55 pm: [facility] is still waiting for insurance auth for patient to discharge to their short term rehab facility. Rosey Bath with [facility] reports she will continue to update me.   11:25 am : CSW followed up with Rosey Bath at [employer], reports no insurance auth yet. States she will follow up again with [employer] after lunch and provide CSW updates.   Patient continues to [employer] auth for discharge to [facility] at this time.   Expected Discharge Plan: Skilled Nursing Facility Barriers to Discharge: Continued Medical Work up,Insurance Authorization  Expected Discharge Plan and Services Expected Discharge Plan: Skilled Nursing Facility       Living arrangements for the past 2 months: Single Family Home                                       Social Determinants of Health (SDOH) Interventions    Readmission Risk Interventions No flowsheet data found.

## 2021-04-23 DIAGNOSIS — I63511 Cerebral infarction due to unspecified occlusion or stenosis of right middle cerebral artery: Secondary | ICD-10-CM | POA: Diagnosis not present

## 2021-04-23 LAB — CBC
HCT: 36 % (ref 36.0–46.0)
Hemoglobin: 11.5 g/dL — ABNORMAL LOW (ref 12.0–15.0)
MCH: 28.8 pg (ref 26.0–34.0)
MCHC: 31.9 g/dL (ref 30.0–36.0)
MCV: 90 fL (ref 80.0–100.0)
Platelets: 294 10*3/uL (ref 150–400)
RBC: 4 MIL/uL (ref 3.87–5.11)
RDW: 14.9 % (ref 11.5–15.5)
WBC: 8.8 10*3/uL (ref 4.0–10.5)
nRBC: 0 % (ref 0.0–0.2)

## 2021-04-23 LAB — GLUCOSE, CAPILLARY
Glucose-Capillary: 109 mg/dL — ABNORMAL HIGH (ref 70–99)
Glucose-Capillary: 113 mg/dL — ABNORMAL HIGH (ref 70–99)
Glucose-Capillary: 118 mg/dL — ABNORMAL HIGH (ref 70–99)
Glucose-Capillary: 94 mg/dL (ref 70–99)
Glucose-Capillary: 98 mg/dL (ref 70–99)
Glucose-Capillary: 99 mg/dL (ref 70–99)

## 2021-04-23 LAB — HEPARIN LEVEL (UNFRACTIONATED)
Heparin Unfractionated: 0.64 IU/mL (ref 0.30–0.70)
Heparin Unfractionated: 0.48 IU/mL (ref 0.30–0.70)

## 2021-04-23 LAB — PROTIME-INR
INR: 1.9 — ABNORMAL HIGH (ref 0.8–1.2)
Prothrombin Time: 21.4 seconds — ABNORMAL HIGH (ref 11.4–15.2)

## 2021-04-23 MED ORDER — FREE WATER
150.0000 mL | Status: DC
  Administered 2021-04-23 – 2021-05-15 (×131): 150 mL

## 2021-04-23 MED ORDER — WARFARIN SODIUM 5 MG PO TABS
10.0000 mg | ORAL_TABLET | Freq: Once | ORAL | Status: AC
  Administered 2021-04-23: 10 mg via ORAL
  Filled 2021-04-23: qty 2

## 2021-04-23 MED ORDER — CEPHALEXIN 250 MG/5ML PO SUSR
500.0000 mg | Freq: Three times a day (TID) | ORAL | Status: AC
  Administered 2021-04-23 – 2021-04-28 (×15): 500 mg
  Filled 2021-04-23 (×15): qty 10

## 2021-04-23 NOTE — Progress Notes (Signed)
Patient ID: Krista Maddox, female   DOB: 09-05-1986, 35 y.o.   MRN: [id number] PEG site re-assessed. Some purulent drainage but no cellulitis. Wound packed with iodoform - change daily. Culture sent. Keflex per tube x5d. OK to go to SNF. We will F/U in Trauma Clinic.  Violeta Gelinas, MD, MPH, FACS Please use [email or web address] to contact on call provider

## 2021-04-23 NOTE — Progress Notes (Signed)
Nutrition Follow-up  DOCUMENTATION CODES:   Obesity unspecified  INTERVENTION:   Continue tube feeds via PEG: - Osmolite 1.5 @ 50 ml/hr (1200 ml/day) - ProSource TF 90 ml BID - Add free water flushes of 150 ml q 4 hours  Tube feeding regimen provides 1960 kcal, 119 grams of protein, and 914 ml of H2O.  Total free water with flushes: 1814 ml  NUTRITION DIAGNOSIS:   Inadequate oral intake related to inability to eat as evidenced by NPO status.  Ongoing, being addressed via TF  GOAL:   Patient will meet greater than or equal to 90% of their needs  Met via TF  MONITOR:   TF tolerance,Weight trends,Labs,I & O's  REASON FOR ASSESSMENT:   Consult Enteral/tube feeding initiation and management  ASSESSMENT:   Pt with PMH of prior strokes x 4 with mild residual aphasia and L sided numbness, chronic MHAs, HLD, TTP duing pregnancy admitted for R gaze preference, L sided weakness, aphasia and L facial droop with R MCA stroke s.p thrombectomy.  4/22 - Cortrak placed (gastric tip) 4/27 - diet advanced to dysphagia 1 with thin liquids 5/05 - Cortrak removed, PEG placement  Pt remains NPO at this time with plans to d/c to SNF. Pt continues to work with SLP.  Per Trauma MD note, pt's PEG site with purulent drainage but no cellulitis.  Discussed pt with RN. Pt tolerating current TF regimen. Will continue with current tube feeds and add free water flushes.  Admit weight: 114.8 kg Current weight: 110.3 kg  Medications reviewed and include: keflex, protonix, senna, warfarin, heparin  Labs reviewed. CBG's: 93-118 x 24 hours  UOP: 900 ml x 24 hours  Diet Order:   Diet Order            Diet NPO time specified  Diet effective midnight                 EDUCATION NEEDS:   No education needs have been identified at this time  Skin:  Skin Assessment: Reviewed RN Assessment  Last BM:  04/22/21  Height:   Ht Readings from Last 1 Encounters:  04/02/21 5' 5"  (1.651 m)     Weight:   Wt Readings from Last 1 Encounters:  04/23/21 110.3 kg    Ideal Body Weight:  56.8 kg  BMI:  Body mass index is 40.47 kg/m.  Estimated Nutritional Needs:   Kcal:  1900-2100  Protein:  110-125 grams  Fluid:  > 2 L/day    Gustavus Bryant, MS, RD, LDN Inpatient Clinical Dietitian Please see AMiON for contact information.

## 2021-04-23 NOTE — Progress Notes (Signed)
CSW spoke with Rosey Bath at [employer] - she will check for insurance authorization and return call to CSW.  Edwin Dada, MSW, LCSW Transitions of Care  Clinical Social Worker II [phone]

## 2021-04-23 NOTE — Progress Notes (Signed)
ANTICOAGULATION CONSULT NOTE  Pharmacy Consult for heparin > warfarin Indication:  Acute stroke  Allergies  Allergen Reactions  . Tape Rash    Cannot tolerate paper tape    Patient Measurements: Height: 5\' 5"  (165.1 cm) Weight: 110.3 kg (243 lb 2.7 oz) IBW/kg (Calculated) : 57  Heparin dosing weight: 72 kg    Vital Signs: Temp: 98.1 F (36.7 C) (05/12 0750) Temp Source: Axillary (05/12 0750) BP: 113/68 (05/12 0750) Pulse Rate: 87 (05/12 0750)  Labs: Recent Labs    04/20/21 0913 04/20/21 1259 04/21/21 0255 04/22/21 0503 04/22/21 0825 04/22/21 1140 04/22/21 2159 04/23/21 0149  HGB 11.0*  --  11.2* 11.8*  --   --   --  11.5*  HCT 34.4*  --  35.6* 37.8  --   --   --  36.0  PLT 283  --  334 320  --   --   --  294  LABPROT 22.2*   < > 27.0* 19.9* 20.1* 19.6*  --  21.4*  INR 2.0*   < > 2.5* 1.7* 1.7* 1.7*  --  1.9*  HEPARINUNFRC >1.10*   < > 0.37  --   --   --  0.44 0.64  CREATININE 0.57  --   --  0.65  --   --   --   --    < > = values in this interval not displayed.    Estimated Creatinine Clearance: 122.5 mL/min (by C-G formula based on SCr of 0.65 mg/dL).  Assessment: 35 y.o female on apixaban PTA for hx prior strokes, ?DVT.  Last dose PTA " unknown," admitted 04/02/21 with acute CVA. No TPA due to Apixaban pta.   -4/21 s/p thrombectomy. -Neurologist consulted pharmacist on 04/08/21 to start warfarin with goal INR 2.5-3.5 per Dr. 04/10/21.  INR today dropped and is SUBtherapeutic (INR 1.7 << 2.5, goal of 2.5-3.5). Discussed with neuro Morrie Sheldon) and will need to resume the Heparin drip until the INR is back up to goal.   Goal of Therapy:  INR 2.5-3.5 per Neurologist  Anti-Xa level 0.3-0.5  Monitor platelets by anticoagulation protocol: Yes   Plan:  - Decrease Heparin to 1150 units/hr (11.5 ml/hr) - Repeat Warfarin 10 mg x 1 dose at 1600 today - Daily PT/INR, CBC q72h - Will continue to monitor for any signs/symptoms of bleeding and will follow up with heparin  level in 6 hours and PT/INR in the AM   Thank you for allowing pharmacy to be a part of this patient's care.  Pearlean Brownie, PharmD, BCPS Clinical Pharmacist Clinical phone for 04/23/2021: 06/23/2021 04/23/2021 7:56 AM   **Pharmacist phone directory can now be found on [email or web address] ([email or web address]).  Listed under [facility].

## 2021-04-23 NOTE — Progress Notes (Signed)
ANTICOAGULATION CONSULT NOTE - Follow Up Consult  Pharmacy Consult for heparin Indication: stroke  Labs: Recent Labs    04/20/21 0913 04/20/21 1259 04/20/21 2259 04/21/21 0255 04/22/21 0503 04/22/21 0825 04/22/21 1140 04/22/21 2159  HGB 11.0*  --   --  11.2* 11.8*  --   --   --   HCT 34.4*  --   --  35.6* 37.8  --   --   --   PLT 283  --   --  334 320  --   --   --   LABPROT 22.2*   < >  --  27.0* 19.9* 20.1* 19.6*  --   INR 2.0*   < >  --  2.5* 1.7* 1.7* 1.7*  --   HEPARINUNFRC >1.10*   < > 0.50 0.37  --   --   --  0.44  CREATININE 0.57  --   --   --  0.65  --   --   --    < > = values in this interval not displayed.    Assessment/Plan:  35yo female therapeutic on heparin after resuming for low INR. Will continue gtt at current rate of 1300 units/hr and confirm stable with am labs.   Vernard Gambles, PharmD, BCPS  04/23/2021,12:07 AM

## 2021-04-23 NOTE — Progress Notes (Signed)
ANTICOAGULATION CONSULT NOTE  Pharmacy Consult for heparin > warfarin Indication:  Acute stroke  Allergies  Allergen Reactions  . Tape Rash    Cannot tolerate paper tape    Patient Measurements: Height: 5\' 5"  (165.1 cm) Weight: 110.3 kg (243 lb 2.7 oz) IBW/kg (Calculated) : 57  Heparin dosing weight: 72 kg    Vital Signs: Temp: 99.3 F (37.4 C) (05/12 1525) Temp Source: Axillary (05/12 1525) BP: 114/79 (05/12 1525) Pulse Rate: 99 (05/12 1525)  Labs: Recent Labs    04/21/21 0255 04/22/21 0503 04/22/21 0825 04/22/21 1140 04/22/21 2159 04/23/21 0149 04/23/21 1536  HGB 11.2* 11.8*  --   --   --  11.5*  --   HCT 35.6* 37.8  --   --   --  36.0  --   PLT 334 320  --   --   --  294  --   LABPROT 27.0* 19.9* 20.1* 19.6*  --  21.4*  --   INR 2.5* 1.7* 1.7* 1.7*  --  1.9*  --   HEPARINUNFRC 0.37  --   --   --  0.44 0.64 0.48  CREATININE  --  0.65  --   --   --   --   --     Estimated Creatinine Clearance: 122.5 mL/min (by C-G formula based on SCr of 0.65 mg/dL).  Assessment: 35 y.o female on apixaban PTA for hx prior strokes, ?DVT.  Last dose PTA " unknown," admitted 04/02/21 with acute CVA. No TPA due to Apixaban pta.   -4/21 s/p thrombectomy. -Neurologist consulted pharmacist on 04/08/21 to start warfarin with goal INR 2.5-3.5 per Dr. 04/10/21.  IHeparin bridge started while INR is subtherapeutic. Heparin level this evening is within goal range at 0.48.   Goal of Therapy:  INR 2.5-3.5 per Neurologist  Anti-Xa level 0.3-0.5  Monitor platelets by anticoagulation protocol: Yes   Plan:  - Continue Heparin 1150 units/hr (11.5 ml/hr) - Daily coags   Morrie Sheldon, PharmD, BCPS, [facility] Clinical Pharmacist [phone] Please check AMION for all [facility] Pharmacy numbers 04/23/2021

## 2021-04-23 NOTE — Progress Notes (Signed)
STROKE TEAM PROGRESS NOTE   INTERVAL HISTORY Neuro exam stable. INR 1.9, remains on heparin drip. Tmax 99.  Has bed offer at SNF , awaiting insurance approval.   Vitals:   04/23/21 0500 04/23/21 0750 04/23/21 0857 04/23/21 1130  BP:  113/68 109/76 107/73  Pulse: 93 87 (!) 105 90  Resp: 18 17 16 16   Temp:  98.1 F (36.7 C)  98.5 F (36.9 C)  TempSrc:  Axillary  Oral  SpO2: 98% 98% 97% 97%  Weight: 110.3 kg     Height:       CBC:  Recent Labs  Lab 04/22/21 0503 04/23/21 0149  WBC 8.9 8.8  HGB 11.8* 11.5*  HCT 37.8 36.0  MCV 90.9 90.0  PLT 320 294   Basic Metabolic Panel:  Recent Labs  Lab 04/20/21 0913 04/22/21 0503  NA 138 137  K 3.9 4.2  CL 108 104  CO2 21* 22  GLUCOSE 126* 98  BUN 19 24*  CREATININE 0.57 0.65  CALCIUM 9.0 9.6   IMAGING past 24 hours No results found.  PHYSICAL EXAM  Temp:  [97.4 F (36.3 C)-99.6 F (37.6 C)] 98.5 F (36.9 C) (05/12 1130) Pulse Rate:  [87-105] 90 (05/12 1130) Resp:  [16-19] 16 (05/12 1130) BP: (106-118)/(68-80) 107/73 (05/12 1130) SpO2:  [96 %-98 %] 97 % (05/12 1130) Weight:  [110.3 kg] 110.3 kg (05/12 0500)  General -obese young Caucasian lady, in no apparent distress.  Resp: No extra work of breathing.  Cardiovascular - Regular rhythm and rate. Neuro - Awake alert eyes open, nonverbal, not following commands. Right gaze preference, barely cross midline.  Blinking to visual threat on the right but not on the left.  Left facial droop.  Left upper extremity flaccid, left LE knee flexion with pain at left foot  2/3. Hypohophonic vocalization with grunt which seemed to be attempt at speech. Right upper extremity spontaneous movement against gravity.  Right lower extremity at least 3/5 spontaneously. Increased muscle tone at LUE and LLE.    ASSESSMENT/PLAN Krista L Matthewsis a 35 y.o.femalewith history of multiple strokes who presented as a code stroke with symptoms suggestive of LVO. tPA was not given due to  Eliquis. CTA head and neck revealed an occlusion of distal right M2 MCA with partial reconstitution and subsequent reocclusion of most M2 branches. Patient underwent thrombectomy.   Recurrent cryptogenic embolic strokes -Acute infarct of the R MCA due to right M1 occlusion s/p IR with TICI2c,cryptogenic etiology  Code Stroke:CT head No acuteintracranial hemorrhage.Acute infarcts in the right MCAterritory.ASPECTS8.   CTA head & neckOcclusion of distal right M1 MCA with partial reconstitution and subsequent reocclusion of most M2 branches. Poor collaterals.  CTPerfusion imaging demonstrates core infarction 99 mL and penumbra of 34 mL. The extent of core infarction is calculated at greater than apparent loss of gray-white differentiation on noncontrast head CT. Therefore, penumbra may be greater.  2D EchoLVEF 60-65%, no structural abnormalities noted that could increase risk for CVA  Repeat CT Head 4/24-large left MCA infarct, no midline shift. Small SAH sylvian fissure, diminishing in size  Hypercoagulable panel: Homocysteine 15.1 due to smoking, otherwise negative to date  LDL48  HgbA1c5.7  VTE prophylaxis -SCDs+ Lovenox  aspirin 325 mg daily and Eliquis (apixaban) dailyprior to admission, Due to recurrent cryptogenic strokes with history of stroke on Eliquis and failed other medications now on ASA 325 with heparin IV. Warfarin (Goal INR 2.5-3.5)    PT-INR 1.0->1.2->1.5->2.2->3.0->2.7->1.7  Therapy recommendations:SNF  Disposition:Pending  Hx of  cryptogenic strokes  In 08/2017 patient admitted for left sided numbness and headache.  MRI showed right cerebellum, right temporoparietal small infarcts.  EF 55 to 60%, CT head and neck unremarkable.  LDL 89, A1c 4.8.  TCD bubble study showed trivial PFO.  DVT negative.  TEE no PFO.  Hypercoagulable and autoimmune work-up negative.  Discharged with aspirin and Lipitor.  01/2018 episode of left facial numbness and  left leg weakness.  CT head and neck unremarkable.  MRI negative.  06/2018 admitted to [facility] for right hand/face numbness.  MRI showed right tiny parietal infarct.  10/2020 admitted to [facility] for aphasia status post tPA.  CTA neck showed left M2 occlusion.  MRI showed left MCA and punctate right frontal infarct.  TCD bubble study potential trivial PFO, TEE tiny PFO.  11/2020 she was referred to loop recorder which was not done as physician thought she should be candidate for anticoagulation.  Started on Eliquis 5 mg twice daily.  Possible shingles Negative PCR  Left flank skin lesion with blisters   Concerning for shingles  Varicella zoster PCR negative, spoke with IC RN and precautions discontinued 5/6.   Now trasnsferred to negative pressure room  Contact and airborne isolation  On valtrex discontinued  WOC on board for dressing  Leukocytosis, resolved  Low grade ?postop fever 100 5/5 overnight with slight increase in WBC to 12, monitor   WBC 11.7>9.8->10.0->10.9->10.0->12  UA negative  Tachycardia, resolved  Labtealol 100mg  BID  HR 90s  Hyperlipidemia  Home meds:atrovastatin 80mg  and zetia 10mg    Will restart post CT if stable  LDL 48, goal < 70  Resumed, on lipitor 80 and zetia 10  Continue statin at discharge  Dysphagia- DYS diet 1  Cortrack placementfor TF  SLP and RD following  Zero intake x 3 day calorie count on nocturnal feedings alone.   Restarted full tube feeding regimen  S/p PEG POD1: care per Dr. , need properly fitting abd binder which has been ordered and discussed with RN  Birth control  Recommend OP PCP or OBGYN discontinue Depo - provera due to numerous Contraindications including hx of tobacco use, migraines, and multiple strokes.  Tobacco abuse  Current smoker  Smoking cessation counselingwill beprovided  Contraindicated for birth control Depo  Hx of pseudotumor cerebri Chronic migraine  Follows  with Dr. 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  Other Stroke Risk Factors  MorbidObesity,Body mass index is 42.12 kg/m., BMI >/= 30 associated with increased stroke risk, recommend weight loss, diet and exercise as appropriate   Obstructive sleep apnea  Hospital day # 21   Patient appears medically stable to be transferred to rehab when bed available.  INR is still slightly suboptimal so continue IV heparin.  Pharmacy to adjust warfarin dosage.  Discussed with pharmacist.  Greater than 50% time during this 25-minute visit was spent on counseling and coordination of care and discussion with care team.  Terrace Arabia, MD    To contact Stroke Continuity provider, please refer to [facility]. After hours, contact General Neurolog

## 2021-04-24 DIAGNOSIS — I63511 Cerebral infarction due to unspecified occlusion or stenosis of right middle cerebral artery: Secondary | ICD-10-CM | POA: Diagnosis not present

## 2021-04-24 LAB — CBC
HCT: 36.8 % (ref 36.0–46.0)
Hemoglobin: 11.6 g/dL — ABNORMAL LOW (ref 12.0–15.0)
MCH: 28.6 pg (ref 26.0–34.0)
MCHC: 31.5 g/dL (ref 30.0–36.0)
MCV: 90.9 fL (ref 80.0–100.0)
Platelets: 303 10*3/uL (ref 150–400)
RBC: 4.05 MIL/uL (ref 3.87–5.11)
RDW: 15 % (ref 11.5–15.5)
WBC: 9.6 10*3/uL (ref 4.0–10.5)
nRBC: 0 % (ref 0.0–0.2)

## 2021-04-24 LAB — GLUCOSE, CAPILLARY
Glucose-Capillary: 104 mg/dL — ABNORMAL HIGH (ref 70–99)
Glucose-Capillary: 109 mg/dL — ABNORMAL HIGH (ref 70–99)
Glucose-Capillary: 120 mg/dL — ABNORMAL HIGH (ref 70–99)
Glucose-Capillary: 90 mg/dL (ref 70–99)
Glucose-Capillary: 93 mg/dL (ref 70–99)
Glucose-Capillary: 96 mg/dL (ref 70–99)

## 2021-04-24 LAB — PROTIME-INR
INR: 2.4 — ABNORMAL HIGH (ref 0.8–1.2)
Prothrombin Time: 26.2 seconds — ABNORMAL HIGH (ref 11.4–15.2)

## 2021-04-24 LAB — HEPARIN LEVEL (UNFRACTIONATED)
Heparin Unfractionated: 0.7 IU/mL (ref 0.30–0.70)
Heparin Unfractionated: 0.38 IU/mL (ref 0.30–0.70)

## 2021-04-24 MED ORDER — WARFARIN SODIUM 7.5 MG PO TABS
7.5000 mg | ORAL_TABLET | Freq: Once | ORAL | Status: AC
  Administered 2021-04-24: 7.5 mg via ORAL
  Filled 2021-04-24: qty 1

## 2021-04-24 NOTE — TOC Progression Note (Signed)
Transition of Care [facility]) - Progression Note    Patient Details  Name: Krista Maddox MRN: [id number] Date of Birth: [birth year]  Transition of Care [facility]) CM/SW Contact  Eduard Roux, [place] Phone Number: 04/24/2021, 12:19 PM  Clinical Narrative:     CSW spoke with [facility]- CSW was informed, they are having difficulty getting authorization- has been provided with numbers but unsuccessfully been able to get it started. SNF will  contact CSW today by 3pm to provide update of status.   Antony Blackbird, MSW, LCSW Clinical Social Worker   Expected Discharge Plan: Skilled Nursing Facility Barriers to Discharge: Continued Medical Work up,Insurance Authorization  Expected Discharge Plan and Services Expected Discharge Plan: Skilled Nursing Facility       Living arrangements for the past 2 months: Single Family Home                                       Social Determinants of Health (SDOH) Interventions    Readmission Risk Interventions No flowsheet data found.

## 2021-04-24 NOTE — Progress Notes (Signed)
Orthopedic Tech Progress Note Patient Details:  Krista Maddox [birth year] [id number]  Ortho Devices Type of Ortho Device: Abdominal binder Ortho Device/Splint Location: STOMACH Ortho Device/Splint Interventions: Ordered   Post Interventions Patient Tolerated: Well Instructions Provided: Care of device   [name] A [name] 04/24/2021, 8:58 AM

## 2021-04-24 NOTE — Progress Notes (Signed)
ANTICOAGULATION CONSULT NOTE  Pharmacy Consult for IV Heparin > Warfarin Indication:  Acute stroke  Allergies  Allergen Reactions  . Tape Rash    Cannot tolerate paper tape    Patient Measurements: Height: 5\' 5"  (165.1 cm) Weight: 110 kg (242 lb 8.1 oz) IBW/kg (Calculated) : 57  Heparin dosing weight: 82.9 kg   Vital Signs: Temp: 99.5 F (37.5 C) (05/13 1510) Temp Source: Axillary (05/13 1510) BP: 110/87 (05/13 1510) Pulse Rate: 94 (05/13 1510)  Labs: Recent Labs    04/22/21 0503 04/22/21 0825 04/22/21 1140 04/22/21 2159 04/23/21 0149 04/23/21 1536 04/24/21 0436 04/24/21 1541  HGB 11.8*  --   --   --  11.5*  --  11.6*  --   HCT 37.8  --   --   --  36.0  --  36.8  --   PLT 320  --   --   --  294  --  303  --   LABPROT 19.9*   < > 19.6*  --  21.4*  --  26.2*  --   INR 1.7*   < > 1.7*  --  1.9*  --  2.4*  --   HEPARINUNFRC  --   --   --    < > 0.64 0.48 0.70 0.38  CREATININE 0.65  --   --   --   --   --   --   --    < > = values in this interval not displayed.    Estimated Creatinine Clearance: 122.3 mL/min (by C-G formula based on SCr of 0.65 mg/dL).  Assessment: 35 y.o female on apixaban PTA for hx prior strokes, ?DVT.  Last dose PTA " unknown," admitted 04/02/21 with acute CVA. No TPA due to apixaban PTA.  - 4/21 S/P thrombectomy. - Neurologist consulted pharmacist on 04/08/21 to start warfarin with goal INR 2.5-3.5, per Dr. 04/10/21.  Heparin level ~6 hrs after heparin infusion was decreased to 1000 units/hr was 0.38 units/ml, which is within the goal range for this pt. H/H 11.6/36.8, plt 303 (CBC stable); INR 2.4 today. Per RN, no issues with IV or bleeding observed.  Goal of Therapy:  INR 2.5-3.5 per Neurologist  Anti-Xa level 0.3-0.5 units/ml Monitor platelets by anticoagulation protocol: Yes   Plan:  Continue heparin infusion at 1000 units/hr Check confirmatory heparin level in 6 hrs Warfarin 7.5 mg x 1 dose at 1600 today - completed Monitor daily  heparin level, INR, CBC Monitor for bleeding  06-17-1990, PharmD, BCPS, [facility] Clinical Pharmacist 04/24/2021 5:48 PM

## 2021-04-24 NOTE — Progress Notes (Signed)
ANTICOAGULATION CONSULT NOTE  Pharmacy Consult for heparin > warfarin Indication:  Acute stroke  Allergies  Allergen Reactions  . Tape Rash    Cannot tolerate paper tape    Patient Measurements: Height: 5\' 5"  (165.1 cm) Weight: 110 kg (242 lb 8.1 oz) IBW/kg (Calculated) : 57  Heparin dosing weight: 72 kg    Vital Signs: Temp: 98.9 F (37.2 C) (05/13 0738) Temp Source: Axillary (05/13 0738) BP: 114/64 (05/13 0738) Pulse Rate: 102 (05/13 0738)  Labs: Recent Labs    04/22/21 0503 04/22/21 0825 04/22/21 1140 04/22/21 2159 04/23/21 0149 04/23/21 1536 04/24/21 0436  HGB 11.8*  --   --   --  11.5*  --  11.6*  HCT 37.8  --   --   --  36.0  --  36.8  PLT 320  --   --   --  294  --  303  LABPROT 19.9*   < > 19.6*  --  21.4*  --  26.2*  INR 1.7*   < > 1.7*  --  1.9*  --  2.4*  HEPARINUNFRC  --   --   --    < > 0.64 0.48 0.70  CREATININE 0.65  --   --   --   --   --   --    < > = values in this interval not displayed.    Estimated Creatinine Clearance: 122.3 mL/min (by C-G formula based on SCr of 0.65 mg/dL).  Assessment: 35 y.o female on apixaban PTA for hx prior strokes, ?DVT.  Last dose PTA " unknown," admitted 04/02/21 with acute CVA. No TPA due to Apixaban pta.   -4/21 s/p thrombectomy. -Neurologist consulted pharmacist on 04/08/21 to start warfarin with goal INR 2.5-3.5 per Dr. 04/10/21.  Heparin level this morning is SUPRAtherapeutic of lower goal range (HL 0.7, goal of 0.3-0.5). INR today is slightly SUBtherapeutic (INR 2.4 << 1.7, goal of 2.5-3.5). Noted overnight bleeding around PEG tube site requiring change in dressing and abd binder. Will continue to monitor for bleeding. CBC stable.   Noted patient had bandage on both arms - so unclear where labs were drawn. The bandage on the arm that is the same side as heparin was noted to be below the infusion. Given soaking of dressing overnight and INR close to goal - will consider true lab and decrease heparin.  Goal of  Therapy:  INR 2.5-3.5 per Neurologist  Anti-Xa level 0.3-0.5  Monitor platelets by anticoagulation protocol: Yes   Plan:  - Decrease Heparin to 1000 units/hr (10 ml/hr) - Warfarin 7.5 mg x 1 dose at 1600 today - Daily PT/INR, CBC q72h - Will continue to monitor for any signs/symptoms of bleeding and will follow up with heparin level in 6 hours and PT/INR in the AM   Thank you for allowing pharmacy to be a part of this patient's care.  Morrie Sheldon, PharmD, BCPS Clinical Pharmacist Clinical phone for 04/24/2021: 04/26/2021 04/24/2021 8:51 AM   **Pharmacist phone directory can now be found on [email or web address] ([email or web address]).  Listed under [facility].

## 2021-04-24 NOTE — Progress Notes (Signed)
Occupational Therapy Treatment Patient Details Name: Krista Maddox MRN: [id number] DOB: 1986/04/11 Today's Date: 04/24/2021    History of present illness The pt is a 35 yo female presenting with L sided facial droop and L sided weakness on 4/21. Underwent angiogram and revascularization on 4/21. S/p PEG 5/5. 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   OT comments  Pt seen in conjunction with PT to maximize pts activity tolerance. Pt making progress with L sided inattention with pt able to track past midline this session. Pt continues to require total A +2 for bed mobility and MIN - MAX A for static sitting balance, unable to stand despite total A +2 efforts. Worked on lateral leans to LUE to promote neuromuscular reeducation with pt needing MAX to return to midline, additionally worked on functional reaching with RUE and following one step commands. Pt would continue to benefit from skilled occupational therapy while admitted and after d/c to address the below listed limitations in order to improve overall functional mobility and facilitate independence with BADL participation. DC plan remains appropriate, will follow acutely per POC.     Follow Up Recommendations  SNF;Supervision/Assistance - 24 hour    Equipment Recommendations  3 in 1 bedside commode    Recommendations for Other Services      Precautions / Restrictions Precautions Precautions: Fall Precaution Comments: R gaze, L hemi, abdominal binder Required Braces or Orthoses: Other Brace Other Brace: L PRAFO Restrictions Weight Bearing Restrictions: No       Mobility Bed Mobility Overal bed mobility: Needs Assistance Bed Mobility: Supine to Sit;Sit to Supine     Supine to sit: +2 for physical assistance;Total assist;+2 for safety/equipment Sit to supine: Total assist;+2 for safety/equipment;+2 for physical assistance;HOB elevated   General bed mobility comments:  max-total assist for supine<>sit for trunk and LE management, scooting to/from EOB, and boost up in bed.    Transfers Overall transfer level: Needs assistance Equipment used: 2 person hand held assist Transfers: Sit to/from Stand Sit to Stand: Total assist;+2 physical assistance;+2 safety/equipment;From elevated surface         General transfer comment: unable to stand despite total A +2 efforts, pt just scooting forward instead of taking weight through BLEs    Balance Overall balance assessment: Needs assistance Sitting-balance support: Feet supported;Single extremity supported;No upper extremity supported Sitting balance-Leahy Scale: Poor Sitting balance - Comments: pt ranging from MIN - MODA  initailly for static sitting balance with pt needing up to MAX A as pt fatigued. worked on lateral leans to L side with pt needing MAX A to return to midline with no active ROM in LUE to attempt to push back to midline. Additonally worked on reaching with RUE during sitting balance, pt able to retrieve blanket and soap from from OTA and place soap on pts R side     Standing balance-Leahy Scale: Zero Standing balance comment: Unable to stand with TAx2.                           ADL either performed or assessed with clinical judgement   ADL Overall ADL's : Needs assistance/impaired     Grooming: Brushing hair;Total assistance;Sitting Grooming Details (indicate cue type and reason): hand over hand assist for brushing hair             Lower Body Dressing: Total assistance;Bed level Lower Body Dressing Details (indicate cue type and reason):  to don socks   Toilet Transfer Details (indicate cue type and reason): unable to stand despite MAX A +2         Functional mobility during ADLs: Total assistance;+2 for physical assistance General ADL Comments: pt worked on attention to task and functional reaching with RUE, pt continues to need hand over hand assist for UB ADLS, pt  was able to cross midline this session btu continues with R gaze preference     Vision   Additional Comments: R gaze prerference; L inattention   Perception     Praxis      Cognition Arousal/Alertness: Awake/alert Behavior During Therapy: Restless;Anxious;Flat affect Overall Cognitive Status: Impaired/Different from baseline Area of Impairment: Attention;Following commands;Safety/judgement;Awareness;Problem solving                   Current Attention Level: Selective   Following Commands: Follows one step commands inconsistently Safety/Judgement: Decreased awareness of safety;Decreased awareness of deficits Awareness: Intellectual Problem Solving: Slow processing;Decreased initiation;Difficulty sequencing;Requires verbal cues;Requires tactile cues General Comments: pt not following commands but does make efforts to verbalize spontaneously such as "puppy, sorry, hurt, belly and lay down"        Exercises Other Exercises Other Exercises: no active LUE ROM during lateal leans to L; pt resistant to L elbow extension, fingers were noted to spontaneously extend during session Other Exercises: improved ROM in active cervical rotation to L side with pt able to pass midline   Shoulder Instructions       General Comments L PRAFO donned at end of session with VSS    Pertinent Vitals/ Pain       Pain Assessment: Faces Faces Pain Scale: Hurts even more Pain Location: stomach Pain Descriptors / Indicators: Discomfort;Grimacing;Moaning Pain Intervention(s): Monitored during session;Repositioned  Home Living                                          Prior Functioning/Environment              Frequency  Min 2X/week        Progress Toward Goals  OT Goals(current goals can now be found in the care plan section)  Progress towards OT goals: Progressing toward goals  Acute Rehab OT Goals Patient Stated Goal: unable to state Time For Goal  Achievement: 05/01/21 Potential to Achieve Goals: Fair  Plan Discharge plan remains appropriate;Frequency remains appropriate    Co-evaluation    PT/OT/SLP Co-Evaluation/Treatment: Yes Reason for Co-Treatment: Complexity of the patient's impairments (multi-system involvement);For patient/therapist safety;To address functional/ADL transfers;Necessary to address cognition/behavior during functional activity   OT goals addressed during session: ADL's and self-care      AM-PAC OT "6 Clicks" Daily Activity     Outcome Measure   Help from another person eating meals?: Total Help from another person taking care of personal grooming?: A Lot Help from another person toileting, which includes using toliet, bedpan, or urinal?: Total Help from another person bathing (including washing, rinsing, drying)?: Total Help from another person to put on and taking off regular upper body clothing?: Total Help from another person to put on and taking off regular lower body clothing?: Total 6 Click Score: 7    End of Session Equipment Utilized During Treatment: Gait belt  OT Visit Diagnosis: Other abnormalities of gait and mobility (R26.89);Muscle weakness (generalized) (M62.81);Low vision, both eyes (H54.2);Other symptoms and signs involving cognitive function;Cognitive communication  deficit (R41.841);Hemiplegia and hemiparesis Symptoms and signs involving cognitive functions: Cerebral infarction Hemiplegia - Right/Left: Left Hemiplegia - dominant/non-dominant: Non-Dominant Hemiplegia - caused by: Cerebral infarction   Activity Tolerance Patient tolerated treatment well   Patient Left in bed;with call bell/phone within reach;with bed alarm set   Nurse Communication Mobility status        Time: 5456-2563 OT Time Calculation (min): 28 min  Charges: OT General Charges $OT Visit: 1 Visit OT Treatments $Therapeutic Activity: 8-22 mins  Lenor Derrick., COTA/L Acute Rehabilitation  Services [phone]    Barron Schmid 04/24/2021, 1:40 PM

## 2021-04-24 NOTE — Progress Notes (Signed)
Upon assessment this RN noted peg tube site had bled through 2 split gauze, abdominal binder and onto patient's gown.  When changing gauze and iodoform there was 1-2 blood clots noted.  Dressing changed, abdominal binder changed and gown changed.

## 2021-04-24 NOTE — Progress Notes (Signed)
Physical Therapy Treatment Patient Details Name: Krista Maddox MRN: [id number] DOB: [birth year] Today's Date: 04/24/2021    History of Present Illness The pt is a 35 yo female presenting with L sided facial droop and L sided weakness on 4/21. Underwent angiogram and revascularization on 4/21. S/p PEG 5/5. 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    PT Comments    Pt seen in conjunction with OT to maximize pts activity tolerance and quality and safety of session. Pt making progress with L sided inattention with pt able to track past midline this session. Pt continues to require total A +2 for bed mobility and MIN - MAX A for static sitting balance, unable to stand despite total A +2 efforts. Worked on lateral leans to LUE to promote neuromuscular reeducation with pt needing MAX to return to midline, additionally worked on functional reaching with RUE and following one step commands. Pt still not following any form of commands at this time. Hand-over-hand technique used at times for bil UE facilitation with tasks. Will continue to follow acutely. Current recommendations remain appropriate.    Follow Up Recommendations  SNF;Supervision/Assistance - 24 hour     Equipment Recommendations  None recommended by PT    Recommendations for Other Services       Precautions / Restrictions Precautions Precautions: Fall Precaution Comments: R gaze, L hemi, abdominal binder Required Braces or Orthoses: Other Brace Other Brace: L PRAFO Restrictions Weight Bearing Restrictions: No    Mobility  Bed Mobility Overal bed mobility: Needs Assistance Bed Mobility: Supine to Sit;Sit to Supine     Supine to sit: +2 for physical assistance;Total assist;+2 for safety/equipment Sit to supine: Total assist;+2 for safety/equipment;+2 for physical assistance;HOB elevated   General bed mobility comments: max-total assist for supine<>sit for trunk and LE  management, scooting to/from EOB, and boost up in bed.    Transfers Overall transfer level: Needs assistance Equipment used: 2 person hand held assist Transfers: Sit to/from Stand Sit to Stand: Total assist;+2 physical assistance;+2 safety/equipment;From elevated surface         General transfer comment: unable to stand despite total A +2 efforts, pt just scooting forward instead of taking weight through BLEs  Ambulation/Gait             General Gait Details: unable   Stairs             Wheelchair Mobility    Modified Rankin (Stroke Patients Only) Modified Rankin (Stroke Patients Only) Pre-Morbid Rankin Score: Moderate disability Modified Rankin: Severe disability     Balance Overall balance assessment: Needs assistance Sitting-balance support: Feet supported;Single extremity supported;No upper extremity supported Sitting balance-Leahy Scale: Poor Sitting balance - Comments: pt ranging from MIN - MODA  initailly for static sitting balance with pt needing up to MAX A as pt fatigued. worked on lateral leans to L side with pt needing MAX A to return to midline with no active ROM in LUE to attempt to push back to midline, 3x. Additonally worked on reaching with RUE during sitting balance, pt able to retrieve blanket and soap from from OTA and place soap on pts R side. Needing hand-over-hand for tasks with R on occasion.     Standing balance-Leahy Scale: Zero Standing balance comment: Unable to stand with TAx2.                            Cognition  Arousal/Alertness: Awake/alert Behavior During Therapy: Restless;Anxious;Flat affect Overall Cognitive Status: Impaired/Different from baseline Area of Impairment: Attention;Following commands;Safety/judgement;Awareness;Problem solving                   Current Attention Level: Selective   Following Commands:  (not following commands) Safety/Judgement: Decreased awareness of safety;Decreased  awareness of deficits Awareness: Intellectual Problem Solving: Slow processing;Decreased initiation;Difficulty sequencing;Requires verbal cues;Requires tactile cues General Comments: pt not following any form of commands but does make efforts to verbalize spontaneously such as "puppy, sorry, hurt, belly and lay down".      Exercises Other Exercises Other Exercises: no active LUE ROM during lateal leans to L; pt resistant to L elbow extension, fingers were noted to spontaneously extend during session Other Exercises: improved ROM in active cervical rotation to L side with pt able to pass midline to track people and puppy videos    General Comments General comments (skin integrity, edema, etc.): L PRAFO donned at end of session with VSS      Pertinent Vitals/Pain Pain Assessment: Faces Faces Pain Scale: Hurts even more Pain Location: stomach Pain Descriptors / Indicators: Discomfort;Grimacing;Moaning Pain Intervention(s): Limited activity within patient's tolerance;Monitored during session;Repositioned    Home Living                      Prior Function            PT Goals (current goals can now be found in the care plan section) Acute Rehab PT Goals Patient Stated Goal: unable to state PT Goal Formulation: Patient unable to participate in goal setting Time For Goal Achievement: 04/29/21 Potential to Achieve Goals: Fair Progress towards PT goals: Progressing toward goals (slowly)    Frequency    Min 3X/week      PT Plan Current plan remains appropriate    Co-evaluation PT/OT/SLP Co-Evaluation/Treatment: Yes Reason for Co-Treatment: Necessary to address cognition/behavior during functional activity;For patient/therapist safety;To address functional/ADL transfers PT goals addressed during session: Mobility/safety with mobility;Balance OT goals addressed during session: ADL's and self-care      AM-PAC PT "6 Clicks" Mobility   Outcome Measure  Help needed  turning from your back to your side while in a flat bed without using bedrails?: Total Help needed moving from lying on your back to sitting on the side of a flat bed without using bedrails?: Total Help needed moving to and from a bed to a chair (including a wheelchair)?: Total Help needed standing up from a chair using your arms (e.g., wheelchair or bedside chair)?: Total Help needed to walk in hospital room?: Total Help needed climbing 3-5 steps with a railing? : Total 6 Click Score: 6    End of Session Equipment Utilized During Treatment: Gait belt Activity Tolerance: Patient tolerated treatment well;Patient limited by fatigue Patient left: in bed;with call bell/phone within reach;with bed alarm set;with SCD's reapplied;with restraints reapplied (L PRAFO donned)   PT Visit Diagnosis: Hemiplegia and hemiparesis;Other abnormalities of gait and mobility (R26.89);Difficulty in walking, not elsewhere classified (R26.2);Other symptoms and signs involving the nervous system (R29.898);Muscle weakness (generalized) (M62.81) Hemiplegia - Right/Left: Left Hemiplegia - dominant/non-dominant: Non-dominant Hemiplegia - caused by: Cerebral infarction     Time: 5681-2751 PT Time Calculation (min) (ACUTE ONLY): 28 min  Charges:  $Neuromuscular Re-education: 8-22 mins                     Raymond Gurney, PT, DPT Acute Rehabilitation Services  Pager: [phone] Office: [phone]  Krista Maddox 04/24/2021, 3:24 PM

## 2021-04-24 NOTE — TOC Progression Note (Signed)
Transition of Care [facility]) - Progression Note    Patient Details  Name: Krista Maddox MRN: [id number] Date of Birth: Mar 29, 1986  Transition of Care [facility]) CM/SW Contact  Eduard Roux, [place] Phone Number: 04/24/2021, 4:52 PM  Clinical Narrative:     Patient does not have insurance authorization for SNF-  Per [facility]- they are talking with patient's insurance - SNF will floow up with CSW  Antony Blackbird, MSW, LCSW Clinical Social Worker   Expected Discharge Plan: Skilled Nursing Facility Barriers to Discharge: Continued Medical Work up,Insurance Authorization  Expected Discharge Plan and Services Expected Discharge Plan: Skilled Nursing Facility       Living arrangements for the past 2 months: Single Family Home                                       Social Determinants of Health (SDOH) Interventions    Readmission Risk Interventions No flowsheet data found.

## 2021-04-24 NOTE — Progress Notes (Addendum)
STROKE TEAM PROGRESS NOTE   INTERVAL HISTORY Neuro exam stable. INR 2.4, remains on heparin drip. Tmax 99. 5 Has bed offer at SNF , awaiting insurance approval. No changes  Vitals:   04/24/21 0410 04/24/21 0432 04/24/21 0738 04/24/21 1155  BP: 95/69  114/64 113/73  Pulse: 99  (!) 102 86  Resp: 15  16 16   Temp: 99.5 F (37.5 C)  98.9 F (37.2 C) 99.5 F (37.5 C)  TempSrc: Axillary  Axillary Axillary  SpO2: 99%  99% 98%  Weight:  110 kg    Height:       CBC:  Recent Labs  Lab 04/23/21 0149 04/24/21 0436  WBC 8.8 9.6  HGB 11.5* 11.6*  HCT 36.0 36.8  MCV 90.0 90.9  PLT 294 303   Basic Metabolic Panel:  Recent Labs  Lab 04/20/21 0913 04/22/21 0503  NA 138 137  K 3.9 4.2  CL 108 104  CO2 21* 22  GLUCOSE 126* 98  BUN 19 24*  CREATININE 0.57 0.65  CALCIUM 9.0 9.6   IMAGING past 24 hours No results found.  PHYSICAL EXAM  Temp:  [98.9 F (37.2 C)-99.6 F (37.6 C)] 99.5 F (37.5 C) (05/13 1155) Pulse Rate:  [86-102] 86 (05/13 1155) Resp:  [15-20] 16 (05/13 1155) BP: (95-131)/(64-79) 113/73 (05/13 1155) SpO2:  [95 %-99 %] 98 % (05/13 1155) Weight:  [110 kg] 110 kg (05/13 0432)  General -obese young Caucasian lady, in no apparent distress.  Resp: No extra work of breathing.  Cardiovascular - Regular rhythm and rate. Neuro - Awake alert eyes open, nonverbal, not following commands. Right gaze preference, barely cross midline.  Blinking to visual threat on the right but not on the left.  Left facial droop.  Left upper extremity flaccid, left LE knee flexion with pain at left foot  2/3. Hypohophonic vocalization with grunt which seemed to be attempt at speech. Right upper extremity spontaneous movement against gravity.  Right lower extremity at least 3/5 spontaneously. Increased muscle tone at LUE and LLE.    ASSESSMENT/PLAN Krista Maddox is a 35 y.o. female with history of multiple strokes who presented as a code stroke with symptoms suggestive of LVO.  tPA was not given due to Eliquis. CTA head and neck revealed an occlusion of distal right M2 MCA with partial reconstitution and subsequent reocclusion of most M2 branches. Patient underwent thrombectomy.    Recurrent cryptogenic embolic strokes - Acute infarct of the R MCA due to right M1 occlusion s/p IR with TICI2c, cryptogenic etiology  Code Stroke: CT head No acute intracranial hemorrhage. Acute infarcts in the right MCA territory. ASPECTS 8.   CTA head & neck Occlusion of distal right M1 MCA with partial reconstitution and subsequent reocclusion of most M2 branches. Poor collaterals. CT Perfusion imaging demonstrates core infarction 99 mL and penumbra of 34 mL. The extent of core infarction is calculated at greater than apparent loss of gray-white differentiation on noncontrast head CT. Therefore, penumbra may be greater. 2D Echo LVEF 60-65%, no structural abnormalities noted that could increase risk for CVA Repeat CT Head 4/24 - large left MCA infarct, no midline shift. Small SAH sylvian fissure, diminishing in size Hypercoagulable panel: Homocysteine 15.1 due to smoking, otherwise negative to date LDL 48 HgbA1c 5.7 VTE prophylaxis - SCDs + Lovenox aspirin 325 mg daily and Eliquis (apixaban) daily prior to admission, Due to recurrent cryptogenic strokes with history of stroke on Eliquis and failed other medications now on ASA 325 with  heparin IV. Warfarin (Goal INR 2.5-3.5)  PT-INR 1.0->1.2->1.5->2.2->3.0->2.7->1.7 Therapy recommendations:  SNF Disposition:  Pending  Hx of cryptogenic strokes In 08/2017 patient admitted for left sided numbness and headache.  MRI showed right cerebellum, right temporoparietal small infarcts.  EF 55 to 60%, CT head and neck unremarkable.  LDL 89, A1c 4.8.  TCD bubble study showed trivial PFO.  DVT negative.  TEE no PFO.  Hypercoagulable and autoimmune work-up negative.  Discharged with aspirin and Lipitor. 01/2018 episode of left facial numbness and left leg  weakness.  CT head and neck unremarkable.  MRI negative. 06/2018 admitted to [facility] for right hand/face numbness.  MRI showed right tiny parietal infarct. 10/2020 admitted to [facility] for aphasia status post tPA.  CTA neck showed left M2 occlusion.  MRI showed left MCA and punctate right frontal infarct.  TCD bubble study potential trivial PFO, TEE tiny PFO. 11/2020 she was referred to loop recorder which was not done as physician thought she should be candidate for anticoagulation.  Started on Eliquis 5 mg twice daily.  Possible shingles Negative PCR Left flank skin lesion with blisters  Concerning for shingles Varicella zoster PCR negative, spoke with IC RN and precautions discontinued 5/6.  Now trasnsferred to negative pressure room Contact and airborne isolation On valtrex discontinued WOC on board for dressing  Leukocytosis, resolved Low grade ?postop fever 100 5/5 overnight with slight increase in WBC to 12, monitor  WBC 11.7>9.8->10.0->10.9->10.0->12 UA negative   Tachycardia, resolved Labtealol 100mg  BID HR 90s   Hyperlipidemia Home meds:  atrovastatin 80mg  and zetia 10mg   Will restart post CT if stable LDL 48, goal < 70 Resumed, on lipitor 80 and zetia 10 Continue statin at discharge   Dysphagia- DYS diet 1 Cortrack placement for TF SLP and RD following Zero intake x 3 day calorie count on nocturnal feedings alone.  Restarted full tube feeding regimen S/p PEG POD1: care per Dr. , need properly fitting abd binder which has been ordered and discussed with RN  Birth control Recommend OP PCP or OBGYN discontinue Depo - provera due to numerous Contraindications including hx of tobacco use, migraines, and multiple strokes.    Tobacco abuse Current smoker Smoking cessation counseling will be provided Contraindicated for birth control Depo   Hx of pseudotumor cerebri Chronic migraine Follow[facility] with [facility]. 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   Other Stroke Risk Factors Morbid Obesity, Body mass index is 42.12 kg/m., BMI >/= 30 associated with increased stroke risk, recommend weight loss, diet and exercise as appropriate  Obstructive sleep apnea   Hospital day # 22   Patient appears medically stable to be transferred to rehab  if bed available today    Pharmacy to adjust warfarin dosage.      Terrace Arabia, MD

## 2021-04-25 DIAGNOSIS — R1312 Dysphagia, oropharyngeal phase: Secondary | ICD-10-CM | POA: Diagnosis not present

## 2021-04-25 DIAGNOSIS — I63511 Cerebral infarction due to unspecified occlusion or stenosis of right middle cerebral artery: Secondary | ICD-10-CM | POA: Diagnosis not present

## 2021-04-25 DIAGNOSIS — I6601 Occlusion and stenosis of right middle cerebral artery: Secondary | ICD-10-CM | POA: Diagnosis not present

## 2021-04-25 LAB — GLUCOSE, CAPILLARY
Glucose-Capillary: 102 mg/dL — ABNORMAL HIGH (ref 70–99)
Glucose-Capillary: 110 mg/dL — ABNORMAL HIGH (ref 70–99)
Glucose-Capillary: 125 mg/dL — ABNORMAL HIGH (ref 70–99)
Glucose-Capillary: 128 mg/dL — ABNORMAL HIGH (ref 70–99)
Glucose-Capillary: 78 mg/dL (ref 70–99)
Glucose-Capillary: 84 mg/dL (ref 70–99)

## 2021-04-25 LAB — CBC
HCT: 36.4 % (ref 36.0–46.0)
Hemoglobin: 11.7 g/dL — ABNORMAL LOW (ref 12.0–15.0)
MCH: 29 pg (ref 26.0–34.0)
MCHC: 32.1 g/dL (ref 30.0–36.0)
MCV: 90.1 fL (ref 80.0–100.0)
Platelets: 288 10*3/uL (ref 150–400)
RBC: 4.04 MIL/uL (ref 3.87–5.11)
RDW: 15.1 % (ref 11.5–15.5)
WBC: 9.2 10*3/uL (ref 4.0–10.5)
nRBC: 0 % (ref 0.0–0.2)

## 2021-04-25 LAB — BASIC METABOLIC PANEL
Anion gap: 12 (ref 5–15)
BUN: 21 mg/dL — ABNORMAL HIGH (ref 6–20)
CO2: 21 mmol/L — ABNORMAL LOW (ref 22–32)
Calcium: 9.3 mg/dL (ref 8.9–10.3)
Chloride: 103 mmol/L (ref 98–111)
Creatinine, Ser: 0.64 mg/dL (ref 0.44–1.00)
GFR, Estimated: >60 mL/min (ref >60)
Glucose, Bld: 103 mg/dL — ABNORMAL HIGH (ref 70–99)
Potassium: 3.9 mmol/L (ref 3.5–5.1)
Sodium: 136 mmol/L (ref 135–145)

## 2021-04-25 LAB — HEPARIN LEVEL (UNFRACTIONATED): Heparin Unfractionated: 0.43 IU/mL (ref 0.30–0.70)

## 2021-04-25 LAB — AEROBIC CULTURE W GRAM STAIN (SUPERFICIAL SPECIMEN)

## 2021-04-25 LAB — PROTIME-INR
INR: 2.7 — ABNORMAL HIGH (ref 0.8–1.2)
Prothrombin Time: 28.6 seconds — ABNORMAL HIGH (ref 11.4–15.2)

## 2021-04-25 MED ORDER — WARFARIN SODIUM 7.5 MG PO TABS
7.5000 mg | ORAL_TABLET | Freq: Once | ORAL | Status: AC
  Administered 2021-04-25: 7.5 mg via ORAL
  Filled 2021-04-25: qty 1

## 2021-04-25 NOTE — Progress Notes (Signed)
STROKE TEAM PROGRESS NOTE   INTERVAL HISTORY No family at the bedside. Pt initially sleeping but awake with tactile stimulation. Smile to me on awakening. She is able to move eyes cross midline to the left. Still has global aphasia and left hemiplegia, but increased LUE and LLE muscle tone. INR 2.7, heparin IV discontinued.   Vitals:   04/25/21 0346 04/25/21 0500 04/25/21 0730 04/25/21 1053  BP: 124/80  121/90 109/71  Pulse: 96  83 87  Resp: 20  20 17   Temp: 99.1 F (37.3 C)  98.8 F (37.1 C) 99.5 F (37.5 C)  TempSrc: Axillary  Oral Oral  SpO2: 100%  98% 97%  Weight:  110.2 kg    Height:       CBC:  Recent Labs  Lab 04/24/21 0436 04/25/21 0351  WBC 9.6 9.2  HGB 11.6* 11.7*  HCT 36.8 36.4  MCV 90.9 90.1  PLT 303 288   Basic Metabolic Panel:  Recent Labs  Lab 04/22/21 0503 04/25/21 0351  NA 137 136  K 4.2 3.9  CL 104 103  CO2 22 21*  GLUCOSE 98 103*  BUN 24* 21*  CREATININE 0.65 0.64  CALCIUM 9.6 9.3   IMAGING past 24 hours No results found.  PHYSICAL EXAM  Temp:  [98.7 F (37.1 C)-99.7 F (37.6 C)] 99.5 F (37.5 C) (05/14 1053) Pulse Rate:  [83-97] 87 (05/14 1053) Resp:  [16-20] 17 (05/14 1053) BP: (109-124)/(65-90) 109/71 (05/14 1053) SpO2:  [97 %-100 %] 97 % (05/14 1053) Weight:  [110.2 kg] 110.2 kg (05/14 0500)  General - Well nourished, well developed, in no apparent distress.  Resp: No extra work of breathing.  Cardiovascular - Regular rhythm and rate. Neuro - Eyes open on voice, able to maintain eye opening during exam, nonverbal, not following commands. Right gaze preference, but able to cross midline and left gaze incomplete.  Blinking to visual threat on the right but not on the left. Left facial droop. Left upper extremity 0/5 but significantly increased muscle tone, left LE slight withdraw to pain but also with increased muscle tone. Hypohophonic vocalization with grunt which seemed to be attempt at speech.Right upper extremity spontaneous  movement against gravity. Right lower extremity at least 3/5 spontaneously. Sensation, coordination and gait not able to be tested.   ASSESSMENT/PLAN [name] is a 35 y.o. female with history of multiple strokes who presented as a code stroke with symptoms suggestive of LVO. tPA was not given due to Eliquis. CTA head and neck revealed an occlusion of distal right M2 MCA with partial reconstitution and subsequent reocclusion of most M2 branches. Patient underwent thrombectomy.    Recurrent cryptogenic embolic strokes - Acute infarct of the R MCA due to right M1 occlusion s/p IR with TICI2c, cryptogenic etiology   Code Stroke: CT head No acute intracranial hemorrhage. Acute infarcts in the right MCA territory. ASPECTS 8.    CTA head & neck Occlusion of distal right M1 MCA with partial reconstitution and subsequent reocclusion of most M2 branches. Poor collaterals.  CT Perfusion imaging demonstrates core infarction 99 mL and penumbra of 34 mL. The extent of core infarction is calculated at greater than apparent loss of gray-white differentiation on noncontrast head CT. Therefore, penumbra may be greater.  2D Echo LVEF 60-65%, no structural abnormalities noted that could increase risk for CVA  Repeat CT Head 4/24 - large left MCA infarct, no midline shift. Small SAH sylvian fissure, diminishing in size  Hypercoagulable panel: Homocysteine 15.1  due to smoking, otherwise negative to date  LDL 48  HgbA1c 5.7  VTE prophylaxis - SCDs + Lovenox  aspirin 325 mg daily and Eliquis (apixaban) daily prior to admission, now on ASA 325 and coumadin with INR goal 2.5-3.5 due to recurrent cryptogenic strokes failed Eliquis.  INR 2.4->2.7  Therapy recommendations:  SNF  Disposition:  Pending  Hx of cryptogenic strokes  In 08/2017 patient admitted for left sided numbness and headache.  MRI showed right cerebellum, right temporoparietal small infarcts.  EF 55 to 60%, CT head and neck  unremarkable.  LDL 89, A1c 4.8.  TCD bubble study showed trivial PFO.  DVT negative.  TEE no PFO.  Hypercoagulable and autoimmune work-up negative.  Discharged with aspirin and Lipitor.  01/2018 episode of left facial numbness and left leg weakness.  CT head and neck unremarkable.  MRI negative.  06/2018 admitted to [facility] for right hand/face numbness.  MRI showed right tiny parietal infarct.  10/2020 admitted to [facility] for aphasia status post tPA.  CTA neck showed left M2 occlusion.  MRI showed left MCA and punctate right frontal infarct.  TCD bubble study potential trivial PFO, TEE tiny PFO.  11/2020 she was referred to loop recorder which was not done as physician thought she should be candidate for anticoagulation.  Started on Eliquis 5 mg twice daily.  Skin rash with blisters  Left flank skin lesion with blisters   Was concerning for shingles  Varicella zoster PCR negative, spoke with IC RN and precautions discontinued 5/6.   Off contact and airborne isolation  Valtrex discontinued  WOC on board for dressing  Leukocytosis, resolved  Low grade ?postop fever 100 5/5 overnight with slight increase in WBC to 12, monitor   WBC 11.7>9.8->10.0->10.9->10.0->12->9.2  UA negative   Tachycardia, resolved  Labtealol 100mg  BID  HR 90s   Hyperlipidemia  Home meds:  atrovastatin 80mg  and zetia 10mg    Will restart post CT if stable  LDL 48, goal < 70  Resumed lipitor 80 and zetia 10  Continue statin at discharge   Dysphagia- DYS diet 1  Cortrack placement for TF  SLP and RD following  Zero intake x 3 day calorie count on nocturnal feedings alone.   S/p PEG    On TF  Birth control  Recommend OP PCP or OBGYN discontinue Depo - provera due to numerous Contraindications including hx of tobacco use, migraines, and multiple strokes.    Tobacco abuse  Current smoker  Smoking cessation counseling will be provided  Contraindicated for birth control Depo   Hx of  pseudotumor cerebri Chronic migraine  Follows with Dr. 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   Other Stroke Risk Factors  Morbid Obesity, Body mass index is 42.12 kg/m., BMI >/= 30 associated with increased stroke risk, recommend weight loss, diet and exercise as appropriate   Obstructive sleep apnea   Hospital day # 23   Terrace Arabia, MD PhD Stroke Neurology 04/25/2021 3:38 PM

## 2021-04-25 NOTE — Progress Notes (Signed)
ANTICOAGULATION CONSULT NOTE  Pharmacy Consult for warfarin Indication:  Acute stroke  Allergies  Allergen Reactions  . Tape Rash    Cannot tolerate paper tape    Patient Measurements: Height: 5\' 5"  (165.1 cm) Weight: 110.2 kg (242 lb 15.2 oz) IBW/kg (Calculated) : 57  Heparin dosing weight: 72 kg    Vital Signs: Temp: 99.1 F (37.3 C) (05/14 0346) Temp Source: Axillary (05/14 0346) BP: 124/80 (05/14 0346) Pulse Rate: 96 (05/14 0346)  Labs: Recent Labs    04/23/21 0149 04/23/21 1536 04/24/21 0436 04/24/21 1541 04/25/21 0351  HGB 11.5*  --  11.6*  --  11.7*  HCT 36.0  --  36.8  --  36.4  PLT 294  --  303  --  288  LABPROT 21.4*  --  26.2*  --  28.6*  INR 1.9*  --  2.4*  --  2.7*  HEPARINUNFRC 0.64   < > 0.70 0.38 0.43  CREATININE  --   --   --   --  0.64   < > = values in this interval not displayed.    Estimated Creatinine Clearance: 122.5 mL/min (by C-G formula based on SCr of 0.64 mg/dL).  Assessment: 35 y.o female on apixaban PTA for hx prior strokes, ?DVT.  Last dose PTA " unknown," admitted 04/02/21 with acute CVA. No TPA due to apixaban pta.   -4/21 s/p thrombectomy. -Neurologist consulted pharmacist on 04/08/21 to start warfarin with goal INR 2.5-3.5 per Dr. 04/10/21.  INR today is therapeutic at 2.7 with trend up (goal of 2.5-3.5). Noted early morning bleeding on 5/13 around PEG tube site requiring change in dressing and abd binder - none since documented. Will continue to monitor for bleeding. CBC stable.   Goal of Therapy:  INR 2.5-3.5 per Neurologist  Monitor platelets by anticoagulation protocol: Yes   Plan:  - Discontinue IV heparin per discussion with Dr 6/13 - Warfarin 7.5 mg x 1 dose at 1600 today - Daily PT/INR - Will continue to monitor for any signs/symptoms of bleeding   Thank you for involving pharmacy in this patient's care.  Roda Shutters, PharmD, BCPS Clinical Pharmacist Clinical phone for 04/25/2021 until 3p is 04/27/2021 04/25/2021  9:16 AM  **Pharmacist phone directory can be found on [email or web address] listed under [facility]**

## 2021-04-26 DIAGNOSIS — I63511 Cerebral infarction due to unspecified occlusion or stenosis of right middle cerebral artery: Secondary | ICD-10-CM | POA: Diagnosis not present

## 2021-04-26 LAB — GLUCOSE, CAPILLARY
Glucose-Capillary: 121 mg/dL — ABNORMAL HIGH (ref 70–99)
Glucose-Capillary: 92 mg/dL (ref 70–99)
Glucose-Capillary: 92 mg/dL (ref 70–99)
Glucose-Capillary: 96 mg/dL (ref 70–99)
Glucose-Capillary: 96 mg/dL (ref 70–99)
Glucose-Capillary: 96 mg/dL (ref 70–99)

## 2021-04-26 LAB — PROTIME-INR
INR: 2.5 — ABNORMAL HIGH (ref 0.8–1.2)
Prothrombin Time: 27 seconds — ABNORMAL HIGH (ref 11.4–15.2)

## 2021-04-26 MED ORDER — WARFARIN SODIUM 5 MG PO TABS
10.0000 mg | ORAL_TABLET | Freq: Once | ORAL | Status: AC
  Administered 2021-04-26: 10 mg
  Filled 2021-04-26: qty 2

## 2021-04-26 NOTE — Progress Notes (Signed)
STROKE TEAM PROGRESS NOTE   INTERVAL HISTORY No family at the bedside. Pt initially sleeping but awake with voice. No neuro changes. INR 2.5, pending SNF.   Vitals:   04/26/21 0315 04/26/21 0500 04/26/21 0716 04/26/21 1115  BP:   103/78 125/88  Pulse:   96 94  Resp:   18 (!) 25  Temp:   97.6 F (36.4 C) 98 F (36.7 C)  TempSrc: Oral  Oral Oral  SpO2:   99% 98%  Weight:  110.2 kg    Height:       CBC:  Recent Labs  Lab 04/24/21 0436 04/25/21 0351  WBC 9.6 9.2  HGB 11.6* 11.7*  HCT 36.8 36.4  MCV 90.9 90.1  PLT 303 288   Basic Metabolic Panel:  Recent Labs  Lab 04/22/21 0503 04/25/21 0351  NA 137 136  K 4.2 3.9  CL 104 103  CO2 22 21*  GLUCOSE 98 103*  BUN 24* 21*  CREATININE 0.65 0.64  CALCIUM 9.6 9.3   IMAGING past 24 hours No results found.  PHYSICAL EXAM  Temp:  [97.6 F (36.4 C)-98.9 F (37.2 C)] 98 F (36.7 C) (05/15 1115) Pulse Rate:  [94-101] 94 (05/15 1115) Resp:  [18-25] 25 (05/15 1115) BP: (100-125)/(65-88) 125/88 (05/15 1115) SpO2:  [98 %-99 %] 98 % (05/15 1115) Weight:  [110.2 kg] 110.2 kg (05/15 0500)  General - Well nourished, well developed, in no apparent distress.  Resp: No extra work of breathing.  Cardiovascular - Regular rhythm and rate. Neuro - Eyes open on voice, able to maintain eye opening during exam, nonverbal, not following commands. Right gaze preference, but able to cross midline and left gaze incomplete.  Blinking to visual threat on the right but not on the left. Left facial droop. Left upper extremity 0/5 but significantly increased muscle tone, left LE slight withdraw to pain but also with increased muscle tone. Hypohophonic vocalization with grunt which seemed to be attempt at speech.Right upper extremity spontaneous movement against gravity. Right lower extremity at least 3/5 spontaneously. Sensation, coordination and gait not able to be tested.   ASSESSMENT/PLAN [name] is a 35 y.o. female with  history of multiple strokes who presented as a code stroke with symptoms suggestive of LVO. tPA was not given due to Eliquis. CTA head and neck revealed an occlusion of distal right M2 MCA with partial reconstitution and subsequent reocclusion of most M2 branches. Patient underwent thrombectomy.    Recurrent cryptogenic embolic strokes - Acute infarct of the R MCA due to right M1 occlusion s/p IR with TICI2c, cryptogenic etiology   Code Stroke: CT head No acute intracranial hemorrhage. Acute infarcts in the right MCA territory. ASPECTS 8.    CTA head & neck Occlusion of distal right M1 MCA with partial reconstitution and subsequent reocclusion of most M2 branches. Poor collaterals.  CT Perfusion imaging demonstrates core infarction 99 mL and penumbra of 34 mL. The extent of core infarction is calculated at greater than apparent loss of gray-white differentiation on noncontrast head CT. Therefore, penumbra may be greater.  2D Echo LVEF 60-65%, no structural abnormalities noted that could increase risk for CVA  Repeat CT Head 4/24 - large left MCA infarct, no midline shift. Small SAH sylvian fissure, diminishing in size  Hypercoagulable panel: Homocysteine 15.1 due to smoking, otherwise negative to date  LDL 48  HgbA1c 5.7  VTE prophylaxis - SCDs + Lovenox  aspirin 325 mg daily and Eliquis (apixaban) daily prior to  admission, now on ASA 325 and coumadin with INR goal 2.5-3.5 due to recurrent cryptogenic strokes failed Eliquis.  INR 2.4->2.7->2.5  Therapy recommendations:  SNF  Disposition:  Pending  Hx of cryptogenic strokes  In 08/2017 patient admitted for left sided numbness and headache.  MRI showed right cerebellum, right temporoparietal small infarcts.  EF 55 to 60%, CT head and neck unremarkable.  LDL 89, A1c 4.8.  TCD bubble study showed trivial PFO.  DVT negative.  TEE no PFO.  Hypercoagulable and autoimmune work-up negative.  Discharged with aspirin and Lipitor.  01/2018  episode of left facial numbness and left leg weakness.  CT head and neck unremarkable.  MRI negative.  06/2018 admitted to [facility] for right hand/face numbness.  MRI showed right tiny parietal infarct.  10/2020 admitted to [facility] for aphasia status post tPA.  CTA neck showed left M2 occlusion.  MRI showed left MCA and punctate right frontal infarct.  TCD bubble study potential trivial PFO, TEE tiny PFO.  11/2020 she was referred to loop recorder which was not done as physician thought she should be candidate for anticoagulation.  Started on Eliquis 5 mg twice daily.  Skin rash with blisters  Left flank skin lesion with blisters   Was concerning for shingles  Varicella zoster PCR negative, spoke with IC RN and precautions discontinued 5/6.   Off contact and airborne isolation  Valtrex discontinued  WOC on board for dressing  Leukocytosis, resolved  Low grade ?postop fever 100 5/5 overnight with slight increase in WBC to 12, monitor   WBC 11.7>9.8->10.0->10.9->10.0->12->9.2  UA negative   Tachycardia, resolved  Labtealol 100mg  BID  HR 90s   Hyperlipidemia  Home meds:  atrovastatin 80mg  and zetia 10mg    Will restart post CT if stable  LDL 48, goal < 70  Resumed lipitor 80 and zetia 10  Continue statin at discharge   Dysphagia- DYS diet 1  Cortrack placement for TF  SLP and RD following  Zero intake x 3 day calorie count on nocturnal feedings alone.   S/p PEG    On TF  Birth control  Recommend OP PCP or OBGYN discontinue Depo - provera due to numerous Contraindications including hx of tobacco use, migraines, and multiple strokes.    Tobacco abuse  Current smoker  Smoking cessation counseling will be provided  Contraindicated for birth control Depo   Hx of pseudotumor cerebri Chronic migraine  Follows with Dr. 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   Other  Stroke Risk Factors  Morbid Obesity, Body mass index is 42.12 kg/m., BMI >/= 30 associated with increased stroke risk, recommend weight loss, diet and exercise as appropriate   Obstructive sleep apnea   Hospital day # 24   Terrace Arabia, MD PhD Stroke Neurology 04/26/2021 4:35 PM

## 2021-04-26 NOTE — Progress Notes (Signed)
ANTICOAGULATION CONSULT NOTE  Pharmacy Consult for warfarin Indication:  Acute stroke  Allergies  Allergen Reactions  . Tape Rash    Cannot tolerate paper tape    Patient Measurements: Height: 5\' 5"  (165.1 cm) Weight: 110.2 kg (242 lb 15.2 oz) IBW/kg (Calculated) : 57  Heparin dosing weight: 72 kg    Vital Signs: Temp: 97.6 F (36.4 C) (05/15 0716) Temp Source: Oral (05/15 0716) BP: 103/78 (05/15 0716) Pulse Rate: 96 (05/15 0716)  Labs: Recent Labs    04/24/21 0436 04/24/21 1541 04/25/21 0351 04/26/21 0126  HGB 11.6*  --  11.7*  --   HCT 36.8  --  36.4  --   PLT 303  --  288  --   LABPROT 26.2*  --  28.6* 27.0*  INR 2.4*  --  2.7* 2.5*  HEPARINUNFRC 0.70 0.38 0.43  --   CREATININE  --   --  0.64  --     Estimated Creatinine Clearance: 122.5 mL/min (by C-G formula based on SCr of 0.64 mg/dL).  Assessment: 35 y.o female on apixaban PTA for hx prior strokes, ?DVT.  Last dose PTA " unknown," admitted 04/02/21 with acute CVA. No TPA due to apixaban pta.   -4/21 s/p thrombectomy. -Neurologist consulted pharmacist on 04/08/21 to start warfarin with goal INR 2.5-3.5 per Dr. 04/10/21.  INR today is therapeutic at 2.5 (goal of 2.5-3.5) - will give higher dose to attempt to keep >2.5. Noted early morning bleeding on 5/13 around PEG tube site requiring change in dressing and abd binder - none since documented. Will continue to monitor for bleeding. CBC stable 5/14.   Goal of Therapy:  INR 2.5-3.5 per Neurologist  Monitor platelets by anticoagulation protocol: Yes   Plan:  - Warfarin 10 mg x 1 dose at 1600 today - Daily PT/INR - Will continue to monitor for any signs/symptoms of bleeding   Thank you for involving pharmacy in this patient's care.  6/14, PharmD, BCPS Clinical Pharmacist Clinical phone for 04/26/2021 until 3p is [phone] 04/26/2021 12:26 PM  **Pharmacist phone directory can be found on [email or web address] listed under [facility]**

## 2021-04-27 DIAGNOSIS — I6601 Occlusion and stenosis of right middle cerebral artery: Secondary | ICD-10-CM | POA: Diagnosis not present

## 2021-04-27 DIAGNOSIS — I63511 Cerebral infarction due to unspecified occlusion or stenosis of right middle cerebral artery: Secondary | ICD-10-CM | POA: Diagnosis not present

## 2021-04-27 LAB — CBC
HCT: 38.7 % (ref 36.0–46.0)
Hemoglobin: 12.1 g/dL (ref 12.0–15.0)
MCH: 28.6 pg (ref 26.0–34.0)
MCHC: 31.3 g/dL (ref 30.0–36.0)
MCV: 91.5 fL (ref 80.0–100.0)
Platelets: 293 10*3/uL (ref 150–400)
RBC: 4.23 MIL/uL (ref 3.87–5.11)
RDW: 15.2 % (ref 11.5–15.5)
WBC: 10.3 10*3/uL (ref 4.0–10.5)
nRBC: 0 % (ref 0.0–0.2)

## 2021-04-27 LAB — BASIC METABOLIC PANEL
Anion gap: 10 (ref 5–15)
BUN: 22 mg/dL — ABNORMAL HIGH (ref 6–20)
CO2: 22 mmol/L (ref 22–32)
Calcium: 9.4 mg/dL (ref 8.9–10.3)
Chloride: 105 mmol/L (ref 98–111)
Creatinine, Ser: 0.72 mg/dL (ref 0.44–1.00)
GFR, Estimated: >60 mL/min (ref >60)
Glucose, Bld: 101 mg/dL — ABNORMAL HIGH (ref 70–99)
Potassium: 4.2 mmol/L (ref 3.5–5.1)
Sodium: 137 mmol/L (ref 135–145)

## 2021-04-27 LAB — GLUCOSE, CAPILLARY
Glucose-Capillary: 99 mg/dL (ref 70–99)
Glucose-Capillary: 88 mg/dL (ref 70–99)
Glucose-Capillary: 111 mg/dL — ABNORMAL HIGH (ref 70–99)
Glucose-Capillary: 100 mg/dL — ABNORMAL HIGH (ref 70–99)
Glucose-Capillary: 105 mg/dL — ABNORMAL HIGH (ref 70–99)
Glucose-Capillary: 112 mg/dL — ABNORMAL HIGH (ref 70–99)

## 2021-04-27 LAB — PROTIME-INR
INR: 2.5 — ABNORMAL HIGH (ref 0.8–1.2)
Prothrombin Time: 26.6 seconds — ABNORMAL HIGH (ref 11.4–15.2)

## 2021-04-27 MED ORDER — WARFARIN SODIUM 5 MG PO TABS
10.0000 mg | ORAL_TABLET | Freq: Once | ORAL | Status: AC
  Administered 2021-04-27: 10 mg
  Filled 2021-04-27: qty 2

## 2021-04-27 NOTE — TOC Progression Note (Signed)
Transition of Care [facility]) - Progression Note    Patient Details  Name: Krista Maddox MRN: [id number] Date of Birth: 09/09/1986  Transition of Care [facility]) CM/SW Contact  Eduard Roux, [place] Phone Number: 04/27/2021, 11:29 AM  Clinical Narrative:     5:40pm- received call-SNF states they were unable to get resolved and will try again on Monday. 5:23- sent message to SNF/Teresa- CSW was advised they were still on the phone w/insurance  4:11pm- called patient insurance - constantly getting transferred or disconnected  4:10pm-received message SNF was on the phone with patient's insurance  4:09pm- sent message to SNF regarding insurance auth- status  Antony Blackbird, MSW, LCSW Clinical Social Worker   Expected Discharge Plan: Skilled Nursing Facility Barriers to Discharge: Continued Medical Work up,Insurance Authorization  Expected Discharge Plan and Services Expected Discharge Plan: Skilled Nursing Facility       Living arrangements for the past 2 months: Single Family Home                                       Social Determinants of Health (SDOH) Interventions    Readmission Risk Interventions No flowsheet data found.

## 2021-04-27 NOTE — Progress Notes (Addendum)
STROKE TEAM PROGRESS NOTE   INTERVAL HISTORY No acute events. No visitors at bedside Remains alert, no verbalization today. Not following commands. INR at goal. Medically ready for discharge to SNF.   Vitals:   04/27/21 0429 04/27/21 0900 04/27/21 1100 04/27/21 1216  BP:  112/69    Pulse:      Resp:      Temp:   97.9 F (36.6 C) 97.9 F (36.6 C)  TempSrc:  Oral  Oral  SpO2:      Weight: 110.2 kg     Height:       CBC:  Recent Labs  Lab 04/25/21 0351 04/27/21 0637  WBC 9.2 10.3  HGB 11.7* 12.1  HCT 36.4 38.7  MCV 90.1 91.5  PLT 288 293   Basic Metabolic Panel:  Recent Labs  Lab 04/25/21 0351 04/27/21 0637  NA 136 137  K 3.9 4.2  CL 103 105  CO2 21* 22  GLUCOSE 103* 101*  BUN 21* 22*  CREATININE 0.64 0.72  CALCIUM 9.3 9.4   IMAGING past 24 hours No results found.  PHYSICAL EXAM  Temp:  [97.9 F (36.6 C)-99.1 F (37.3 C)] 97.9 F (36.6 C) (05/16 1216) Pulse Rate:  [93-98] 98 (05/16 0303) Resp:  [19-22] 22 (05/16 0303) BP: (112-128)/(60-89) 112/69 (05/16 0900) SpO2:  [96 %-99 %] 99 % (05/16 0303) Weight:  [110.2 kg] 110.2 kg (05/16 0429)  General - Well nourished, well developed, in no apparent distress.  Resp: No extra work of breathing.  Cardiovascular - Regular rhythm and rate. Neuro - Eyes open on voice, able to maintain eye opening during exam, nonverbal, not following commands. Right gaze preference, but able to cross midline and left gaze incomplete.  Blinking to visual threat on the right but not on the left. Left facial droop. Left upper extremity 0/5 but significantly increased muscle tone, left LE slight withdraw to pain but also with increased muscle tone. Hypohophonic vocalization with grunt which seemed to be attempt at speech.Right upper extremity spontaneous movement against gravity. Right lower extremity at least 3/5 spontaneously. Sensation, coordination and gait not able to be tested.   ASSESSMENT/PLAN [name] is a 35  y.o. female with history of multiple strokes who presented as a code stroke with symptoms suggestive of LVO. tPA was not given due to Eliquis. CTA head and neck revealed an occlusion of distal right M2 MCA with partial reconstitution and subsequent reocclusion of most M2 branches. Patient underwent thrombectomy.    Recurrent cryptogenic embolic strokes - Acute infarct of the R MCA due to right M1 occlusion s/p IR with TICI2c, cryptogenic etiology   Code Stroke: CT head No acute intracranial hemorrhage. Acute infarcts in the right MCA territory. ASPECTS 8.    CTA head & neck Occlusion of distal right M1 MCA with partial reconstitution and subsequent reocclusion of most M2 branches. Poor collaterals.  CT Perfusion imaging demonstrates core infarction 99 mL and penumbra of 34 mL. The extent of core infarction is calculated at greater than apparent loss of gray-white differentiation on noncontrast head CT. Therefore, penumbra may be greater.  2D Echo LVEF 60-65%, no structural abnormalities noted that could increase risk for CVA  Repeat CT Head 4/24 - large left MCA infarct, no midline shift. Small SAH sylvian fissure, diminishing in size  Hypercoagulable panel: Homocysteine 15.1 due to smoking, otherwise negative to date  LDL 48  HgbA1c 5.7  VTE prophylaxis - SCDs + Lovenox  aspirin 325 mg daily and Eliquis (  apixaban) daily prior to admission, now on ASA 325 and coumadin with INR goal 2.5-3.5 due to recurrent cryptogenic strokes failed Eliquis.  INR 2.4->2.7->2.5->2.5  Therapy recommendations:  SNF  Disposition:  Pending  Hx of cryptogenic strokes  In 08/2017 patient admitted for left sided numbness and headache.  MRI showed right cerebellum, right temporoparietal small infarcts.  EF 55 to 60%, CT head and neck unremarkable.  LDL 89, A1c 4.8.  TCD bubble study showed trivial PFO.  DVT negative.  TEE no PFO.  Hypercoagulable and autoimmune work-up negative.  Discharged with aspirin and  Lipitor.  01/2018 episode of left facial numbness and left leg weakness.  CT head and neck unremarkable.  MRI negative.  06/2018 admitted to [facility] for right hand/face numbness.  MRI showed right tiny parietal infarct.  10/2020 admitted to [facility] for aphasia status post tPA.  CTA neck showed left M2 occlusion.  MRI showed left MCA and punctate right frontal infarct.  TCD bubble study potential trivial PFO, TEE tiny PFO.  11/2020 she was referred to loop recorder which was not done as physician thought she should be candidate for anticoagulation.  Started on Eliquis 5 mg twice daily.  Skin rash with blisters  Left flank skin lesion with blisters   Was concerning for shingles  Varicella zoster PCR negative, spoke with IC RN and precautions discontinued 5/6.   Off contact and airborne isolation  Valtrex discontinued  WOC on board for dressing  Leukocytosis, resolved  Low grade ?postop fever 100 5/5 overnight with slight increase in WBC to 12, monitor   WBC 11.7>9.8->10.0->10.9->10.0->12->9.2  UA negative   Tachycardia, resolved  Labtealol 100mg  BID  HR 90s   Hyperlipidemia  Home meds:  atrovastatin 80mg  and zetia 10mg    Will restart post CT if stable  LDL 48, goal < 70  Resumed lipitor 80 and zetia 10  Continue statin at discharge   Dysphagia- DYS diet 1  Cortrack placement for TF  SLP and RD following  Zero intake x 3 day calorie count on nocturnal feedings alone.   S/p PEG    On TF  Birth control  Recommend OP PCP or OBGYN discontinue Depo - provera due to numerous Contraindications including hx of tobacco use, migraines, and multiple strokes.    Tobacco abuse  Current smoker  Smoking cessation counseling will be provided  Contraindicated for birth control Depo   Hx of pseudotumor cerebri Chronic migraine  Follows with Dr. at [facility]  On topamax 150mg  bid PTA and resumed during this admission  10/2017 MRV showed left transverse sinus  hypoplastic.   Other Stroke Risk Factors  Morbid Obesity, Body mass index is 42.12 kg/m., BMI >/= 30 associated with increased stroke risk, recommend weight loss, diet and exercise as appropriate   Obstructive sleep apnea   Hospital day # 25   ATTENDING NOTE: I reviewed above note and agree with the assessment and plan. Pt was seen and examined.   PT/OT at the bedside. Per therapists, pt was able to ask them "when can I go home". 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 Pending SNF. INR today again 2.5. Neuro stable.   For detailed assessment and plan, please refer to above as I have made changes wherever appropriate.   Terrace Arabia, MD PhD Stroke Neurology 04/27/2021 11:00 PM

## 2021-04-27 NOTE — Progress Notes (Signed)
ANTICOAGULATION CONSULT NOTE  Pharmacy Consult for warfarin Indication:  Acute stroke  Allergies  Allergen Reactions  . Tape Rash    Cannot tolerate paper tape    Patient Measurements: Height: 5\' 5"  (165.1 cm) Weight: 110.2 kg (242 lb 15.2 oz) IBW/kg (Calculated) : 57  Heparin dosing weight: 72 kg    Vital Signs: Temp: 97.9 F (36.6 C) (05/16 1216) Temp Source: Oral (05/16 1216) BP: 112/69 (05/16 0900) Pulse Rate: 98 (05/16 0303)  Labs: Recent Labs    04/24/21 1541 04/25/21 0351 04/26/21 0126 04/27/21 0637  HGB  --  11.7*  --  12.1  HCT  --  36.4  --  38.7  PLT  --  288  --  293  LABPROT  --  28.6* 27.0* 26.6*  INR  --  2.7* 2.5* 2.5*  HEPARINUNFRC 0.38 0.43  --   --   CREATININE  --  0.64  --  0.72    Estimated Creatinine Clearance: 122.5 mL/min (by C-G formula based on SCr of 0.72 mg/dL).  Assessment: 35 y.o female on apixaban PTA for hx prior strokes, ?DVT. Last dose PTA " unknown," admitted 04/02/21 with acute CVA. No TPA due to apixaban pta.   -4/21 s/p thrombectomy. -Neurologist consulted pharmacist on 04/08/21 to start warfarin with goal INR 2.5-3.5 per Dr. 04/10/21.  INR today remains therapeutic at 2.5 (goal of 2.5-3.5) - will give higher dose again today to attempt to keep >2.5. Noted early morning bleeding on 5/13 around PEG tube site requiring change in dressing and abd binder - none since documented. CBC stable.  Goal of Therapy:  INR 2.5-3.5 per Neurologist  Monitor platelets by anticoagulation protocol: Yes   Plan:  - Warfarin 10 mg x 1 dose at 1600 again today - Monitor daily INR, CBC, s/sx bleeding   6/13, PharmD, BCPS Please check AMION for all [facility] contact numbers Clinical Pharmacist 04/27/2021 2:28 PM

## 2021-04-27 NOTE — Progress Notes (Signed)
Physical Therapy Treatment Patient Details Name: Krista Maddox MRN: [id number] DOB: [birth year] Today's Date: 04/27/2021    History of Present Illness The pt is a 35 yo female presenting with L sided facial droop and L sided weakness on 4/21. Underwent angiogram and revascularization on 4/21. CT head showing large right MCA territory infarct with 4 mm leftward midline shift. S/p PEG 5/5. 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    PT Comments    Pt with some progress towards her physical therapy goals; alert and visual tracking past midline, follows command to give "thumbs up." Session focused on L sided PROM (noted increased tone in LLE), bed mobility, and sitting balance. Pt requiring two person max-total assist for bed mobility. Static sitting balance appears to have improved, requiring less assist overall. Does not initiate standing. Continue to recommend SNF for ongoing Physical Therapy.       Follow Up Recommendations  SNF;Supervision/Assistance - 24 hour     Equipment Recommendations  None recommended by PT    Recommendations for Other Services       Precautions / Restrictions Precautions Precautions: Fall Precaution Comments: R gaze, L hemi, abdominal binder Required Braces or Orthoses: Other Brace Other Brace: L PRAFO Restrictions Weight Bearing Restrictions: No    Mobility  Bed Mobility Overal bed mobility: Needs Assistance Bed Mobility: Supine to Sit;Sit to Supine Rolling: Max assist;+2 for physical assistance (towards left) Sidelying to sit: Total assist;+2 for physical assistance   Sit to supine: Total assist;+2 for safety/equipment;+2 for physical assistance;HOB elevated   General bed mobility comments: MaxA + 2 to roll towards left, pt gripping onto railing, totalA + 2 for supine <> sit    Transfers Overall transfer level: Needs assistance Equipment used: 2 person hand held assist Transfers: Sit  to/from Stand Sit to Stand: Total assist;+2 physical assistance;+2 safety/equipment;From elevated surface         General transfer comment: totalA + 2 to minimally clear hips, no initiation noted by patient on 2 attempts  Ambulation/Gait             General Gait Details: unable   Stairs             Wheelchair Mobility    Modified Rankin (Stroke Patients Only) Modified Rankin (Stroke Patients Only) Pre-Morbid Rankin Score: Moderate disability Modified Rankin: Severe disability     Balance Overall balance assessment: Needs assistance Sitting-balance support: Feet supported;Single extremity supported;No upper extremity supported Sitting balance-Leahy Scale: Poor Sitting balance - Comments: Requiring min guard-minA, relying on RUE for external support, but not pushing this session.                                    Cognition Arousal/Alertness: Awake/alert Behavior During Therapy: WFL for tasks assessed/performed Overall Cognitive Status: Impaired/Different from baseline Area of Impairment: Following commands;Safety/judgement;Awareness                       Following Commands: Follows one step commands inconsistently Safety/Judgement: Decreased awareness of safety;Decreased awareness of deficits Awareness: Intellectual   General Comments: Pt alert, visual tracking past midline, follows command to give thumbs up. Pt stating "sorry," multiple times and "when can I drive?" Smiling intermittently at therapist      Exercises General Exercises - Lower Extremity Ankle Circles/Pumps: PROM;Left;10 reps;Supine Heel Slides: PROM;Left;10 reps;Supine Other Exercises Other Exercises: Supine:  PROM LUE D1/D2 x 10 each Other Exercises: Sitting: left lateral leans x 3    General Comments        Pertinent Vitals/Pain Pain Assessment: Faces Faces Pain Scale: No hurt Pain Location: stomach    Home Living                      Prior  Function            PT Goals (current goals can now be found in the care plan section) Acute Rehab PT Goals Patient Stated Goal: unable to state PT Goal Formulation: Patient unable to participate in goal setting Time For Goal Achievement: 05/11/21 Potential to Achieve Goals: Fair Progress towards PT goals: Progressing toward goals    Frequency    Min 3X/week      PT Plan Current plan remains appropriate    Co-evaluation              AM-PAC PT "6 Clicks" Mobility   Outcome Measure  Help needed turning from your back to your side while in a flat bed without using bedrails?: Total Help needed moving from lying on your back to sitting on the side of a flat bed without using bedrails?: Total Help needed moving to and from a bed to a chair (including a wheelchair)?: Total Help needed standing up from a chair using your arms (e.g., wheelchair or bedside chair)?: Total Help needed to walk in hospital room?: Total Help needed climbing 3-5 steps with a railing? : Total 6 Click Score: 6    End of Session Equipment Utilized During Treatment: Gait belt Activity Tolerance: Patient tolerated treatment well Patient left: in bed;with call bell/phone within reach;with bed alarm set;with SCD's reapplied;with restraints reapplied (L PRAFO donned) Nurse Communication: Mobility status PT Visit Diagnosis: Hemiplegia and hemiparesis;Other abnormalities of gait and mobility (R26.89);Difficulty in walking, not elsewhere classified (R26.2);Other symptoms and signs involving the nervous system (R29.898);Muscle weakness (generalized) (M62.81) Hemiplegia - Right/Left: Left Hemiplegia - dominant/non-dominant: Non-dominant Hemiplegia - caused by: Cerebral infarction     Time: 1130-1158 PT Time Calculation (min) (ACUTE ONLY): 28 min  Charges:  $Therapeutic Activity: 23-37 mins                     Lillia Pauls, PT, DPT Acute Rehabilitation Services Pager [phone] Office  [phone]    Norval Morton 04/27/2021, 12:55 PM

## 2021-04-27 NOTE — TOC Progression Note (Addendum)
Transition of Care [facility]) - Progression Note    Patient Details  Name: Krista Maddox MRN: [id number] Date of Birth: [birth year]  Transition of Care [facility]) CM/SW Contact  Eduard Roux, [place] Phone Number: 04/27/2021, 11:08 AM  Clinical Narrative:     Patient has no bed offers  Referral submitted but declined-[facility], [facility] and [facility]  11:42am-received call-[facility] - has declined pateint 11:08am- called [facility]-  Requested they confirmed if they can accept patient and the only barrier is insurance authorization- SNF will discuss further with manager(Loi) and call CSW back 11:06am-  Contacted [facility]- declined - does not meet their age requirement 10:37am-[facility] returned call- declined -does not meet their age requirement  10:29am-contracted patient's father,[name]- updated on SNF placement and explained at this point we needs to consider another SNF. He chose [facility]. CSW contacted [facility], requested they review for placement. 10:26am CSW spoke with Admissions Coordinator Rosey Bath- she advised problems getting authorization(keep getting transferred and they no longer have bed offer 10:18am- CSW called insurance [phone]- spoke with operator, provide patient info- call was transferred to pre-service- call lost     Expected Discharge Plan: Skilled Nursing Facility Barriers to Discharge: Continued Medical Work up,Insurance Authorization  Expected Discharge Plan and Services Expected Discharge Plan: Skilled Nursing Facility       Living arrangements for the past 2 months: Single Family Home                                       Social Determinants of Health (SDOH) Interventions    Readmission Risk Interventions No flowsheet data found.

## 2021-04-28 DIAGNOSIS — I63511 Cerebral infarction due to unspecified occlusion or stenosis of right middle cerebral artery: Secondary | ICD-10-CM | POA: Diagnosis not present

## 2021-04-28 DIAGNOSIS — Z8673 Personal history of transient ischemic attack (TIA), and cerebral infarction without residual deficits: Secondary | ICD-10-CM | POA: Diagnosis not present

## 2021-04-28 DIAGNOSIS — I6601 Occlusion and stenosis of right middle cerebral artery: Secondary | ICD-10-CM | POA: Diagnosis not present

## 2021-04-28 DIAGNOSIS — I6340 Cerebral infarction due to embolism of unspecified cerebral artery: Secondary | ICD-10-CM | POA: Diagnosis not present

## 2021-04-28 LAB — GLUCOSE, CAPILLARY
Glucose-Capillary: 100 mg/dL — ABNORMAL HIGH (ref 70–99)
Glucose-Capillary: 110 mg/dL — ABNORMAL HIGH (ref 70–99)
Glucose-Capillary: 123 mg/dL — ABNORMAL HIGH (ref 70–99)
Glucose-Capillary: 137 mg/dL — ABNORMAL HIGH (ref 70–99)
Glucose-Capillary: 95 mg/dL (ref 70–99)
Glucose-Capillary: 96 mg/dL (ref 70–99)
Glucose-Capillary: 98 mg/dL (ref 70–99)

## 2021-04-28 LAB — PROTIME-INR
INR: 2.5 — ABNORMAL HIGH (ref 0.8–1.2)
Prothrombin Time: 27.3 seconds — ABNORMAL HIGH (ref 11.4–15.2)

## 2021-04-28 MED ORDER — WARFARIN SODIUM 5 MG PO TABS
10.0000 mg | ORAL_TABLET | Freq: Once | ORAL | Status: AC
  Administered 2021-04-28: 10 mg
  Filled 2021-04-28: qty 2

## 2021-04-28 NOTE — Progress Notes (Signed)
Occupational Therapy Treatment Patient Details Name: Krista Maddox MRN: [id number] DOB: 1986-07-29 Today's Date: 04/28/2021    History of present illness The pt is a 35 yo female presenting with L sided facial droop and L sided weakness on 4/21. Underwent angiogram and revascularization on 4/21. CT head showing large right MCA territory infarct with 4 mm leftward midline shift. S/p PEG 5/5. 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   OT comments  Pt nearing a plateau. Pt alert, visual tracking past midline intermittently, but not consistently. Pt stating "sorry," but no reason for it. Pt making eye contact, but very little purposeful movement and following few commands. Pt sitting EOB x5 mins before abruptly raising BLEs to lie back down.  Pt with gaze to R and L today, but not following commands. Pt would greatly benefit from continued OT skilled services. OT following acutely.   Follow Up Recommendations  SNF;Supervision/Assistance - 24 hour    Equipment Recommendations  3 in 1 bedside commode    Recommendations for Other Services      Precautions / Restrictions Precautions Precautions: Fall Precaution Comments: R gaze, L hemi, abdominal binder Required Braces or Orthoses: Other Brace Other Brace: L PRAFO Restrictions Weight Bearing Restrictions: No       Mobility Bed Mobility Overal bed mobility: Needs Assistance Bed Mobility: Rolling;Sidelying to Sit;Sit to Supine Rolling: Max assist Sidelying to sit: Total assist   Sit to supine: Total assist   General bed mobility comments: totalA to scoot to [facility] totalA for sitting EOB and minguardA  to modA for sitting EOB dynamic sitting balance.    Transfers                 General transfer comment: deferred d/t +1 assist only    Balance                                           ADL either performed or assessed with clinical judgement   ADL  Overall ADL's : Needs assistance/impaired                                     Functional mobility during ADLs: Total assistance;+2 for physical assistance General ADL Comments: Pt able to scan today, but no purposeful movement without hand over hand provided. Pt with gaze to R and L today, but not following commands.     Vision       Perception     Praxis      Cognition Arousal/Alertness: Awake/alert Behavior During Therapy: WFL for tasks assessed/performed Overall Cognitive Status: Impaired/Different from baseline Area of Impairment: Following commands;Safety/judgement;Awareness                       Following Commands: Follows one step commands inconsistently Safety/Judgement: Decreased awareness of safety;Decreased awareness of deficits Awareness: Intellectual   General Comments: Pt alert, visual tracking past midline intermittently, but not consistently. Pt stating "sorry," but no reason for it. Pt making eye contact, but very little purposeful movement and few commands.        Exercises Exercises: Other exercises Other Exercises Other Exercises: PROM to LUE   Shoulder Instructions       General Comments      Pertinent Vitals/  Pain       Pain Assessment: Faces Faces Pain Scale: No hurt  Home Living                                          Prior Functioning/Environment              Frequency  Min 2X/week        Progress Toward Goals  OT Goals(current goals can now be found in the care plan section)  Progress towards OT goals: Not progressing toward goals - comment (pt at functional baseline most likely for ADL)  Acute Rehab OT Goals Patient Stated Goal: unable to state OT Goal Formulation: Patient unable to participate in goal setting Time For Goal Achievement: 05/01/21 Potential to Achieve Goals: Fair ADL Goals Pt/caregiver will Perform Home Exercise Program: Increased ROM;Increased strength;Both  right and left upper extremity;With written HEP provided Additional ADL Goal #1: Pt will follow one step commands 10% of the time Additional ADL Goal #2: Pt will turn head and maintain at midline or to left of midline for 30 seconds Additional ADL Goal #3: Pt will tolerate sitting EOB with total A for 10 min Additional ADL Goal #4: Continue to assess Pt for splinting needs for LUE  Plan Discharge plan remains appropriate;Frequency remains appropriate    Co-evaluation                 AM-PAC OT "6 Clicks" Daily Activity     Outcome Measure   Help from another person eating meals?: Total Help from another person taking care of personal grooming?: Total Help from another person toileting, which includes using toliet, bedpan, or urinal?: Total Help from another person bathing (including washing, rinsing, drying)?: Total Help from another person to put on and taking off regular upper body clothing?: Total Help from another person to put on and taking off regular lower body clothing?: Total 6 Click Score: 6    End of Session    OT Visit Diagnosis: Other abnormalities of gait and mobility (R26.89);Muscle weakness (generalized) (M62.81);Low vision, both eyes (H54.2);Other symptoms and signs involving cognitive function;Cognitive communication deficit (R41.841);Hemiplegia and hemiparesis Symptoms and signs involving cognitive functions: Cerebral infarction Hemiplegia - Right/Left: Left Hemiplegia - dominant/non-dominant: Non-Dominant Hemiplegia - caused by: Cerebral infarction   Activity Tolerance Patient tolerated treatment well   Patient Left in bed;with call bell/phone within reach;with bed alarm set   Nurse Communication Mobility status        Time: 3976-7341 OT Time Calculation (min): 19 min  Charges: OT General Charges $OT Visit: 1 Visit OT Treatments $Neuromuscular Re-education: 8-22 mins  Flora Lipps, OTR/L Acute Rehabilitation Services Pager: [phone] Office:  [phone]    Flora Lipps 04/28/2021, 6:09 PM

## 2021-04-28 NOTE — Progress Notes (Signed)
ANTICOAGULATION CONSULT NOTE  Pharmacy Consult for warfarin Indication:  Acute stroke  Allergies  Allergen Reactions  . Tape Rash    Cannot tolerate paper tape    Patient Measurements: Height: 5\' 5"  (165.1 cm) Weight: 110.3 kg (243 lb 2.7 oz) IBW/kg (Calculated) : 57  Heparin dosing weight: 72 kg    Vital Signs: Temp: 98.3 F (36.8 C) (05/17 0720) Temp Source: Oral (05/17 0720) BP: 110/68 (05/17 0720) Pulse Rate: 86 (05/17 0259)  Labs: Recent Labs    04/26/21 0126 04/27/21 0637 04/28/21 0105  HGB  --  12.1  --   HCT  --  38.7  --   PLT  --  293  --   LABPROT 27.0* 26.6* 27.3*  INR 2.5* 2.5* 2.5*  CREATININE  --  0.72  --     Estimated Creatinine Clearance: 122.5 mL/min (by C-G formula based on SCr of 0.72 mg/dL).  Assessment: 35 y.o female on apixaban PTA for hx prior strokes, ?DVT. Last dose PTA " unknown," admitted 04/02/21 with acute CVA. No TPA due to apixaban pta.   -4/21 s/p thrombectomy. -Neurologist consulted pharmacist on 04/08/21 to start warfarin with goal INR 2.5-3.5 per Dr. 04/10/21.  INR today remains therapeutic at 2.5 (goal of 2.5-3.5) - will give higher dose again today to attempt to keep >2.5. Noted early morning bleeding on 5/13 around PEG tube site requiring change in dressing and abd binder - none since documented. CBC stable.  Goal of Therapy:  INR 2.5-3.5 per Neurologist  Monitor platelets by anticoagulation protocol: Yes   Plan:  - Warfarin 10 mg x 1 dose at 1600 again today - Monitor daily INR, CBC, s/sx bleeding   6/13, PharmD, BCPS Please check AMION for all [facility] contact numbers Clinical Pharmacist 04/28/2021 1:33 PM

## 2021-04-28 NOTE — Progress Notes (Signed)
STROKE TEAM PROGRESS NOTE   INTERVAL HISTORY No family at bedside.  No acute event overnight, patient still drowsy sleepy, but arousable.  Discussed with PT/OT, patient low level of energy, still recommend SNF.  Discussed with Child psychotherapist, patient is difficult placement.  Vitals:   04/28/21 0259 04/28/21 0500 04/28/21 0720 04/28/21 1945  BP: 115/77  110/68   Pulse: 86   100  Resp: 18     Temp: 99.4 F (37.4 C)  98.3 F (36.8 C) 98.6 F (37 C)  TempSrc: Axillary  Oral Oral  SpO2: 98%     Weight:  110.3 kg    Height:       CBC:  Recent Labs  Lab 04/25/21 0351 04/27/21 0637  WBC 9.2 10.3  HGB 11.7* 12.1  HCT 36.4 38.7  MCV 90.1 91.5  PLT 288 293   Basic Metabolic Panel:  Recent Labs  Lab 04/25/21 0351 04/27/21 0637  NA 136 137  K 3.9 4.2  CL 103 105  CO2 21* 22  GLUCOSE 103* 101*  BUN 21* 22*  CREATININE 0.64 0.72  CALCIUM 9.3 9.4   IMAGING past 24 hours No results found.  PHYSICAL EXAM  Temp:  [98.3 F (36.8 C)-99.4 F (37.4 C)] 98.6 F (37 C) (05/17 1945) Pulse Rate:  [84-100] 100 (05/17 1945) Resp:  [17-18] 18 (05/17 0259) BP: (110-118)/(68-82) 110/68 (05/17 0720) SpO2:  [98 %] 98 % (05/17 0259) Weight:  [110.3 kg] 110.3 kg (05/17 0500)  General - morbid obesity, well developed, in no apparent distress, sleepy.  Resp: No extra work of breathing.  Cardiovascular - Regular rhythm and rate. Neuro - Eyes open on voice, able to maintain eye opening during exam, nonverbal, not following commands. Right gaze preference, but able to cross midline and left gaze incomplete.  Blinking to visual threat on the right but not on the left. Left facial droop. Left upper extremity 0/5 but significantly increased muscle tone, left LE slight withdraw to pain but also with increased muscle tone. Hypohophonic vocalization with grunt which seemed to be attempt at speech.Right upper extremity spontaneous movement against gravity. Right lower extremity at least 3/5  spontaneously. Sensation, coordination and gait not able to be tested.   ASSESSMENT/PLAN Krista Maddox is a 35 y.o. female with history of multiple strokes who presented as a code stroke with symptoms suggestive of LVO. tPA was not given due to Eliquis. CTA head and neck revealed an occlusion of distal right M2 MCA with partial reconstitution and subsequent reocclusion of most M2 branches. Patient underwent thrombectomy.    Recurrent cryptogenic embolic strokes - Acute infarct of the R MCA due to right M1 occlusion s/p IR with TICI2c, cryptogenic etiology   Code Stroke: CT head No acute intracranial hemorrhage. Acute infarcts in the right MCA territory. ASPECTS 8.    CTA head & neck Occlusion of distal right M1 MCA with partial reconstitution and subsequent reocclusion of most M2 branches. Poor collaterals.  CT Perfusion imaging demonstrates core infarction 99 mL and penumbra of 34 mL. The extent of core infarction is calculated at greater than apparent loss of gray-white differentiation on noncontrast head CT. Therefore, penumbra may be greater.  2D Echo LVEF 60-65%, no structural abnormalities noted that could increase risk for CVA  Repeat CT Head 4/24 - large left MCA infarct, no midline shift. Small SAH sylvian fissure, diminishing in size  Hypercoagulable panel: Homocysteine 15.1 due to smoking, otherwise negative to date  LDL 48  HgbA1c 5.7  VTE prophylaxis - SCDs + Lovenox  aspirin 325 mg daily and Eliquis (apixaban) daily prior to admission, now on ASA 325 and coumadin with INR goal 2.5-3.5 due to recurrent cryptogenic strokes failed Eliquis.  INR 2.4->2.7->2.5->2.5->2.5  Therapy recommendations:  SNF  Disposition:  Pending  Hx of cryptogenic strokes  In 08/2017 patient admitted for left sided numbness and headache.  MRI showed right cerebellum, right temporoparietal small infarcts.  EF 55 to 60%, CT head and neck unremarkable.  LDL 89, A1c 4.8.  TCD bubble study  showed trivial PFO.  DVT negative.  TEE no PFO.  Hypercoagulable and autoimmune work-up negative.  Discharged with aspirin and Lipitor.  01/2018 episode of left facial numbness and left leg weakness.  CT head and neck unremarkable.  MRI negative.  06/2018 [facility] for right hand/face numbness.  MRI showed right tiny parietal infarct.  10/2020 admitted to [facility] for aphasia status post tPA.  CTA neck showed left M2 occlusion.  MRI showed left MCA and punctate right frontal infarct.  TCD bubble study potential trivial PFO, TEE tiny PFO.  11/2020 she was referred to loop recorder which was not done as physician thought she should be candidate for anticoagulation.  Started on Eliquis 5 mg twice daily.  Skin rash with blisters  Left flank skin lesion with blisters   Was concerning for shingles  Varicella zoster PCR negative, spoke with IC RN and precautions discontinued 5/6.   Off contact and airborne isolation  Valtrex discontinued  WOC on board for dressing  Leukocytosis, resolved  Low grade ?postop fever 100 5/5 overnight with slight increase in WBC to 12, monitor   WBC 11.7>9.8->10.0->10.9->10.0->12->9.2  UA negative   Tachycardia, resolved  Labtealol 100mg  BID  HR 90s   Hyperlipidemia  Home meds:  atrovastatin 80mg  and zetia 10mg    Will restart post CT if stable  LDL 48, goal < 70  Resumed lipitor 80 and zetia 10  Continue statin at discharge   Dysphagia- DYS diet 1  Cortrack placement for TF  SLP and RD following  Zero intake x 3 day calorie count on nocturnal feedings alone.   S/p PEG    On TF  Birth control  Recommend OP PCP or OBGYN discontinue Depo - provera due to numerous Contraindications including hx of tobacco use, migraines, and multiple strokes.    Tobacco abuse  Current smoker  Smoking cessation counseling will be provided  Contraindicated for birth control Depo   Hx of pseudotumor cerebri Chronic migraine  Follows with  Dr. 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   Other Stroke Risk Factors  Morbid Obesity, Body mass index is 42.12 kg/m., BMI >/= 30 associated with increased stroke risk, recommend weight loss, diet and exercise as appropriate   Obstructive sleep apnea   Hospital day # 26    Terrace Arabia, MD PhD Stroke Neurology 04/28/2021 8:10 PM

## 2021-04-28 NOTE — TOC Progression Note (Signed)
Transition of Care [facility]) - Progression Note    Patient Details  Name: Krista Maddox MRN: [id number] Date of Birth: 06-20-1986  Transition [facility]) CM/SW Contact  Eduard Roux, [place] Phone Number: 04/28/2021, 5:08 PM  Clinical Narrative:     Patient has no bed offers  Antony Blackbird, MSW, LCSW Clinical Social Worker   Expected Discharge Plan: Skilled Nursing Facility Barriers to Discharge: Continued Medical Work up,Insurance Authorization  Expected Discharge Plan and Services Expected Discharge Plan: Skilled Nursing Facility       Living arrangements for the past 2 months: Single Family Home                                       Social Determinants of Health (SDOH) Interventions    Readmission Risk Interventions No flowsheet data found.

## 2021-04-29 DIAGNOSIS — I63511 Cerebral infarction due to unspecified occlusion or stenosis of right middle cerebral artery: Secondary | ICD-10-CM | POA: Diagnosis not present

## 2021-04-29 DIAGNOSIS — I6601 Occlusion and stenosis of right middle cerebral artery: Secondary | ICD-10-CM | POA: Diagnosis not present

## 2021-04-29 DIAGNOSIS — I6340 Cerebral infarction due to embolism of unspecified cerebral artery: Secondary | ICD-10-CM | POA: Diagnosis not present

## 2021-04-29 DIAGNOSIS — Z8673 Personal history of transient ischemic attack (TIA), and cerebral infarction without residual deficits: Secondary | ICD-10-CM | POA: Diagnosis not present

## 2021-04-29 LAB — PROTIME-INR
INR: 3.2 — ABNORMAL HIGH (ref 0.8–1.2)
Prothrombin Time: 32.4 seconds — ABNORMAL HIGH (ref 11.4–15.2)

## 2021-04-29 LAB — CBC
HCT: 38.8 % (ref 36.0–46.0)
Hemoglobin: 11.8 g/dL — ABNORMAL LOW (ref 12.0–15.0)
MCH: 28.2 pg (ref 26.0–34.0)
MCHC: 30.4 g/dL (ref 30.0–36.0)
MCV: 92.8 fL (ref 80.0–100.0)
Platelets: 303 10*3/uL (ref 150–400)
RBC: 4.18 MIL/uL (ref 3.87–5.11)
RDW: 15.5 % (ref 11.5–15.5)
WBC: 10.2 10*3/uL (ref 4.0–10.5)
nRBC: 0 % (ref 0.0–0.2)

## 2021-04-29 LAB — GLUCOSE, CAPILLARY
Glucose-Capillary: 93 mg/dL (ref 70–99)
Glucose-Capillary: 92 mg/dL (ref 70–99)
Glucose-Capillary: 97 mg/dL (ref 70–99)
Glucose-Capillary: 116 mg/dL — ABNORMAL HIGH (ref 70–99)
Glucose-Capillary: 107 mg/dL — ABNORMAL HIGH (ref 70–99)
Glucose-Capillary: 121 mg/dL — ABNORMAL HIGH (ref 70–99)

## 2021-04-29 LAB — BASIC METABOLIC PANEL
Anion gap: 8 (ref 5–15)
BUN: 25 mg/dL — ABNORMAL HIGH (ref 6–20)
CO2: 26 mmol/L (ref 22–32)
Calcium: 9.4 mg/dL (ref 8.9–10.3)
Chloride: 103 mmol/L (ref 98–111)
Creatinine, Ser: 0.71 mg/dL (ref 0.44–1.00)
GFR, Estimated: >60 mL/min (ref >60)
Glucose, Bld: 98 mg/dL (ref 70–99)
Potassium: 3.2 mmol/L — ABNORMAL LOW (ref 3.5–5.1)
Sodium: 137 mmol/L (ref 135–145)

## 2021-04-29 MED ORDER — POTASSIUM CHLORIDE 20 MEQ PO PACK
40.0000 meq | PACK | ORAL | Status: AC
  Administered 2021-04-29 (×2): 40 meq
  Filled 2021-04-29 (×2): qty 2

## 2021-04-29 MED ORDER — WARFARIN SODIUM 7.5 MG PO TABS
7.5000 mg | ORAL_TABLET | Freq: Once | ORAL | Status: AC
  Administered 2021-04-29: 7.5 mg
  Filled 2021-04-29: qty 1

## 2021-04-29 NOTE — Progress Notes (Signed)
  Speech Language Pathology Treatment: Dysphagia;Cognitive-Linquistic  Patient Details Name: Krista Maddox MRN: [id number] DOB: 1986-09-20 Today's Date: 04/29/2021 Time: 9735-3299 SLP Time Calculation (min) (ACUTE ONLY): 24 min  Assessment / Plan / Recommendation Clinical Impression  Pt seen for language-cognition and dysphagia and became teary during session on one occasion. Gave her choice (set on table) of 5 food/consistencies and she shook head to refuse. Continued to look at strawberry yogurt and when therapist presented on spoon she turned head- small amount of lip but no attempts to manipulate. She did hold cup with supervision and took straw sips water and soda without concerns of aspiration although volume was limited. Continue tube feeds and will continue trials po. Decreased verbal out saying "I'm sorry". Followed one step commands with 20% accuracy. She did not respond to yes/no questions today. Continued tx   HPI HPI: Pt is a 35 y.o. female with a PMHx of prior strokes x 4 (right cerebellar and right posterior frontal infarctions in 2018 (while smoking and on oral contraceptives), as well as an acute occlusion of her left M2 on 11/09/2020 for which she was treated with thrombectomy at [facility] with sequelae of aphasia and left sided numbness), chronic MHAs, chronic AC with Eliquis, HLD, ? IIH, DDD, ITP during pregnancy, remote tobacco use, and disorder of optic nerve. Patient presented as a code stroke due to right gaze preference, left sided weakness, aphasia and left facial droop. ME:QASTM infarcts in the right MCA  territory; HDQ:QIWLNLGXQ of distal right M1 MCA with partial reconstitution and  subsequent reocclusion of most M2 branches. Poor collaterals. Pt s/p revascularization of occluded RT MCA. Cotrak placed 4/22      SLP Plan  Continue with current plan of care       Recommendations  Diet recommendations: NPO                Oral Care Recommendations: Oral care  QID Follow up Recommendations: Skilled Nursing facility SLP Visit Diagnosis: Dysphagia, unspecified (R13.10);Aphasia (R47.01);Cognitive communication deficit (J19.417) Plan: Continue with current plan of care       GO                [name] 04/29/2021, 3:56 PM

## 2021-04-29 NOTE — TOC Progression Note (Signed)
Transition of Care [facility]) - Progression Note    Patient Details  Name: Krista Maddox MRN: [id number] Date of Birth: [birth year]  [facility]) CM/SW Contact  Eduard Roux, [place] Phone Number: 04/29/2021, 2:48 PM  Clinical Narrative:     CSW spoke with patient's father,[name]- updated on bed placement9No SNF bed). CSW advised [facility]patient rehab admission coordinator will visit patient tomorrow to complete in person assessment. CSW will update once a decision has been made. Family was appreciative of update.  Antony Blackbird, MSW, LCSW Clinical Social Worker   Expected Discharge Plan: Skilled Nursing Facility Barriers to Discharge: Continued Medical Work up,Insurance Authorization  Expected Discharge Plan and Services Expected Discharge Plan: Skilled Nursing Facility       Living arrangements for the past 2 months: Single Family Home                                       Social Determinants of Health (SDOH) Interventions    Readmission Risk Interventions No flowsheet data found.

## 2021-04-29 NOTE — Progress Notes (Signed)
ANTICOAGULATION CONSULT NOTE  Pharmacy Consult for warfarin Indication:  Acute stroke  Allergies  Allergen Reactions  . Tape Rash    Cannot tolerate paper tape    Patient Measurements: Height: 5\' 5"  (165.1 cm) Weight: 110.3 kg (243 lb 2.7 oz) IBW/kg (Calculated) : 57  Heparin dosing weight: 72 kg    Vital Signs: Temp: 99.2 F (37.3 C) (05/18 1117) Temp Source: Axillary (05/18 1117) BP: 117/75 (05/18 1117) Pulse Rate: 96 (05/18 1117)  Labs: Recent Labs    04/27/21 0637 04/28/21 0105 04/29/21 0451  HGB 12.1  --  11.8*  HCT 38.7  --  38.8  PLT 293  --  303  LABPROT 26.6* 27.3* 32.4*  INR 2.5* 2.5* 3.2*  CREATININE 0.72  --  0.71    Estimated Creatinine Clearance: 122.5 mL/min (by C-G formula based on SCr of 0.71 mg/dL).  Assessment: 35 y.o female on apixaban PTA for hx prior strokes, ?DVT. Last dose PTA " unknown," admitted 04/02/21 with acute CVA. No TPA due to apixaban pta.   -4/21 s/p thrombectomy. -Neurologist consulted pharmacist on 04/08/21 to start warfarin with goal INR 2.5-3.5 per Dr. 04/10/21.  INR today remains therapeutic at 3.2 (goal of 2.5-3.5). Noted early morning bleeding on 5/13 around PEG tube site requiring change in dressing and abd binder - none since documented. CBC stable.  Goal of Therapy:  INR 2.5-3.5 per Neurologist  Monitor platelets by anticoagulation protocol: Yes   Plan:  - Warfarin 7.5mg  x 1 dose at 1600 today - Monitor daily INR, CBC, s/sx bleeding   6/13, PharmD, BCPS Please check AMION for all [facility] contact numbers Clinical Pharmacist 04/29/2021 1:52 PM

## 2021-04-29 NOTE — Progress Notes (Signed)
STROKE TEAM PROGRESS NOTE   INTERVAL HISTORY No family at bedside.  Pt lying in bed, just worked with speech therapist. Awake alert, smile to provider, still aphasic, left hemiplegia with spasticity. K low 3.2 will give supplement.   Vitals:   04/29/21 0359 04/29/21 0500 04/29/21 0717 04/29/21 1117  BP:   117/73 117/75  Pulse:   98 96  Resp:   17 (!) 21  Temp: 97.8 F (36.6 C)  98.8 F (37.1 C) 99.2 F (37.3 C)  TempSrc: Oral  Oral Axillary  SpO2:   100% 99%  Weight:  110.3 kg    Height:       CBC:  Recent Labs  Lab 04/27/21 0637 04/29/21 0451  WBC 10.3 10.2  HGB 12.1 11.8*  HCT 38.7 38.8  MCV 91.5 92.8  PLT 293 303   Basic Metabolic Panel:  Recent Labs  Lab 04/27/21 0637 04/29/21 0451  NA 137 137  K 4.2 3.2*  CL 105 103  CO2 22 26  GLUCOSE 101* 98  BUN 22* 25*  CREATININE 0.72 0.71  CALCIUM 9.4 9.4   IMAGING past 24 hours No results found.  PHYSICAL EXAM  Temp:  [97.8 F (36.6 C)-99.2 F (37.3 C)] 99.2 F (37.3 C) (05/18 1117) Pulse Rate:  [95-102] 96 (05/18 1117) Resp:  [17-21] 21 (05/18 1117) BP: (107-118)/(67-80) 117/75 (05/18 1117) SpO2:  [98 %-100 %] 99 % (05/18 1117) Weight:  [110.3 kg] 110.3 kg (05/18 0500)  General - morbid obesity, well developed, in no apparent distress.  Resp: No extra work of breathing.  Cardiovascular - Regular rhythm and rate. Neuro - Eyes open, awake alert, nonverbal, not following commands. Right gaze preference, but able to cross midline and left gaze incomplete.  Blinking to visual threat on the right but not on the left. Left facial droop. Left upper extremity 0/5 but significantly increased muscle tone, left LE slight withdraw to pain but also with increased muscle tone. Hypohophonic vocalization with grunt which seemed to be attempt at speech.Right upper extremity spontaneous movement against gravity. Right lower extremity at least 3/5 spontaneously. Sensation, coordination and gait not able to be  tested.   ASSESSMENT/PLAN [name] is a 35 y.o. female with history of multiple strokes who presented as a code stroke with symptoms suggestive of LVO. tPA was not given due to Eliquis. CTA head and neck revealed an occlusion of distal right M2 MCA with partial reconstitution and subsequent reocclusion of most M2 branches. Patient underwent thrombectomy.    Recurrent cryptogenic embolic strokes - Acute infarct of the R MCA due to right M1 occlusion s/p IR with TICI2c, cryptogenic etiology   Code Stroke: CT head No acute intracranial hemorrhage. Acute infarcts in the right MCA territory. ASPECTS 8.    CTA head & neck Occlusion of distal right M1 MCA with partial reconstitution and subsequent reocclusion of most M2 branches. Poor collaterals.  CT Perfusion imaging demonstrates core infarction 99 mL and penumbra of 34 mL. The extent of core infarction is calculated at greater than apparent loss of gray-white differentiation on noncontrast head CT. Therefore, penumbra may be greater.  2D Echo LVEF 60-65%, no structural abnormalities noted that could increase risk for CVA  Repeat CT Head 4/24 - large left MCA infarct, no midline shift. Small SAH sylvian fissure, diminishing in size  Hypercoagulable panel: Homocysteine 15.1 due to smoking, otherwise negative to date  LDL 48  HgbA1c 5.7  VTE prophylaxis - SCDs + Lovenox  aspirin 325  mg daily and Eliquis (apixaban) daily prior to admission, now on ASA 325 and coumadin with INR goal 2.5-3.5.  INR 2.4->2.7->2.5->2.5->2.5->3.2  Therapy recommendations:  SNF  Disposition:  Pending  Hx of cryptogenic strokes  In 08/2017 patient admitted for left sided numbness and headache.  MRI showed right cerebellum, right temporoparietal small infarcts.  EF 55 to 60%, CT head and neck unremarkable.  LDL 89, A1c 4.8.  TCD bubble study showed trivial PFO.  DVT negative.  TEE no PFO.  Hypercoagulable and autoimmune work-up negative.  Discharged  with aspirin and Lipitor.  01/2018 episode of left facial numbness and left leg weakness.  CT head and neck unremarkable.  MRI negative.  06/2018 admitted to [facility] for right hand/face numbness.  MRI showed right tiny parietal infarct.  10/2020 admitted to [facility] for aphasia status post tPA.  CTA neck showed left M2 occlusion.  MRI showed left MCA and punctate right frontal infarct.  TCD bubble study potential trivial PFO, TEE tiny PFO.  11/2020 she was referred to loop recorder which was not done as physician thought she should be candidate for anticoagulation.  Started on Eliquis 5 mg twice daily.  Skin rash with blisters  Left flank skin lesion with blisters   Was concerning for shingles  Varicella zoster PCR negative, spoke with IC RN and precautions discontinued 5/6.   Off contact and airborne isolation  Valtrex discontinued  WOC on board for dressing  Leukocytosis, resolved  Low grade ?postop fever 100 5/5 overnight with slight increase in WBC to 12, monitor   WBC 11.7>9.8->10.0->10.9->10.0->12->9.2->10.2  UA negative   Tachycardia, resolved  Labtealol 100mg  BID  HR 90s   Hyperlipidemia  Home meds:  atrovastatin 80mg  and zetia 10mg    Will restart post CT if stable  LDL 48, goal < 70  Resumed lipitor 80 and zetia 10  Continue statin at discharge   Dysphagia- DYS diet 1  Cortrack placement for TF  SLP and RD following  Zero intake x 3 day calorie count on nocturnal feedings alone.   S/p PEG    On TF  Birth control  Recommend OP PCP or OBGYN discontinue Depo - provera due to numerous Contraindications including hx of tobacco use, migraines, and multiple strokes.    Tobacco abuse  Current smoker  Smoking cessation counseling will be provided  Contraindicated for birth control Depo   Hx of pseudotumor cerebri Chronic migraine  Follows [facility] Dr. 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   Other Stroke Risk Factors  Morbid Obesity, Body mass index is 42.12 kg/m., BMI >/= 30 associated with increased stroke risk, recommend weight loss, diet and exercise as appropriate   Obstructive sleep apnea   Hypokalemia K 3.2 -> supplement  Hospital day # 27    Terrace Arabia, MD PhD Stroke Neurology 04/29/2021 11:50 AM

## 2021-04-29 NOTE — Progress Notes (Signed)
Nutrition Follow-up  DOCUMENTATION CODES:   Obesity unspecified  INTERVENTION:   Continue tube feeds via PEG: - Osmolite 1.5 @ 50 ml/hr (1200 ml/day) - ProSource TF 90 ml BID - Free water flushes of 150 ml q 4 hours  Tube feeding regimen provides 1960 kcal, 119 grams of protein, and 914 ml of H2O.  Total free water with flushes: 1814 ml  NUTRITION DIAGNOSIS:   Inadequate oral intake related to inability to eat as evidenced by NPO status.  Ongoing, being addressed via TF  GOAL:   Patient will meet greater than or equal to 90% of their needs  Met via TF  MONITOR:   TF tolerance,Weight trends,Labs,I & O's  REASON FOR ASSESSMENT:   Consult Enteral/tube feeding initiation and management  ASSESSMENT:   Pt with PMH of prior strokes x 4 with mild residual aphasia and L sided numbness, chronic MHAs, HLD, TTP duing pregnancy admitted for R gaze preference, L sided weakness, aphasia and L facial droop with R MCA stroke s.p thrombectomy.  4/22-Cortrak placed (gastric tip) 4/27-diet advanced to dysphagia 1 with thin liquids 5/05 - Cortrak removed, PEG placement  Per TOC note, pt has no SNF bed offers at this time. Pt remains NPO and is receiving continuous tube feeds via PEG.  RD met with pt at bedside. Noted Osmolite 1.2 cal formula infusing via PEG instead of Osmolite 1.5 cal formula which is ordered. Discussed with RN who is going to change the tube feeding out for the correct formula.  Admit weight: 114.8 kg Current weight: 110.3 kg  Current TF: Osmolite 1.5 @ 50 ml/hr, ProSource TF 90 ml BID, free water flushes of 150 ml q 4 hours  Medications reviewed and include: protonix, senna, warfarin  Labs reviewed: potassium 3.2 CBG's: 92-137 x 24 hours  Diet Order:   Diet Order            Diet NPO time specified  Diet effective midnight                 EDUCATION NEEDS:   No education needs have been identified at this time  Skin:  Skin Assessment:  Reviewed RN Assessment  Last BM:  04/28/21  Height:   Ht Readings from Last 1 Encounters:  04/02/21 5' 5" (1.651 m)    Weight:   Wt Readings from Last 1 Encounters:  04/29/21 110.3 kg    Ideal Body Weight:  56.8 kg  BMI:  Body mass index is 40.47 kg/m.  Estimated Nutritional Needs:   Kcal:  1900-2100  Protein:  110-125 grams  Fluid:  > 2 L/day    Kate , MS, RD, LDN Inpatient Clinical Dietitian Please see AMiON for contact information.  

## 2021-04-29 NOTE — Progress Notes (Addendum)
Physical Therapy Treatment Patient Details Name: Krista Maddox MRN: [id number] DOB: 09-03-1986 Today's Date: 04/29/2021    History of Present Illness The pt is a 35 yo female presenting with L sided facial droop and L sided weakness on 4/21. Underwent angiogram and revascularization on 4/21. CT head showing large right MCA territory infarct with 4 mm leftward midline shift. S/p PEG 5/5. 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    PT Comments    Pt restless upon arrival to room, reaching for hair and pillows on her R side. Pt requiring total +2 assist today for all bed mobility tasks, with little to no verbal command following. Pt does best with demonstrative and tactile cuing. Pt lifted to recliner via maximove, and required significant truncal assist for positioning and upright sitting balance. + L quad contraction with quick tapping. PT to continue to follow acutely.    Follow Up Recommendations  SNF;Supervision/Assistance - 24 hour     Equipment Recommendations  None recommended by PT    Recommendations for Other Services       Precautions / Restrictions Precautions Precautions: Fall Precaution Comments: R gaze, L hemi, abdominal binder Required Braces or Orthoses: Other Brace Other Brace: L PRAFO Restrictions Weight Bearing Restrictions: No    Mobility  Bed Mobility Overal bed mobility: Needs Assistance Bed Mobility: Rolling Rolling: Total assist;+2 for physical assistance         General bed mobility comments: Total +2 for roll bilaterally for all aspects, pt reaching RUE towards L bedrail once mostly rolled towards L. Pt's LUE protracted for shoulder protection.    Transfers Overall transfer level: Needs assistance Equipment used: Ambulation equipment used             General transfer comment: Maximove to recliner  Ambulation/Gait                 Stairs             Wheelchair  Mobility    Modified Rankin (Stroke Patients Only) Modified Rankin (Stroke Patients Only) Pre-Morbid Rankin Score: Moderate disability Modified Rankin: Severe disability     Balance Overall balance assessment: Needs assistance Sitting-balance support: Feet supported;Single extremity supported Sitting balance-Leahy Scale: Poor Sitting balance - Comments: reliant on posterior truncal assist from PT to maintain sitting balance                                    Cognition Arousal/Alertness: Awake/alert Behavior During Therapy: WFL for tasks assessed/performed Overall Cognitive Status: Impaired/Different from baseline Area of Impairment: Following commands;Safety/judgement;Awareness                       Following Commands: Follows one step commands inconsistently Safety/Judgement: Decreased awareness of safety;Decreased awareness of deficits Awareness: Intellectual Problem Solving: Difficulty sequencing;Requires verbal cues;Requires tactile cues;Decreased initiation General Comments: Pt restless and reaching for pillows on her R upon PT arrival, difficult to redirect. Pt with visual tracking past midline intermittently towards, L but requires significant PT cuing. Little to no command following with verbal cues, starts to follow commands once mobility is initiated (reaches for bedrails once roll almost complete).      Exercises General Exercises - Lower Extremity Long Arc Quad: PROM;Left;AAROM;Right;10 reps;Seated (quad tapping LLE to facilitate contraction)    General Comments General comments (skin integrity, edema, etc.): L PRAFO donned at end  of session, up in chair with fall pads placed on each side of pt. LUE suported with pillows and blankets      Pertinent Vitals/Pain Pain Assessment: Faces Faces Pain Scale: No hurt Pain Intervention(s): Monitored during session    Home Living                      Prior Function            PT  Goals (current goals can now be found in the care plan section) Acute Rehab PT Goals Patient Stated Goal: unable to state PT Goal Formulation: Patient unable to participate in goal setting Time For Goal Achievement: 05/11/21 Potential to Achieve Goals: Fair Progress towards PT goals: Progressing toward goals    Frequency    Min 3X/week      PT Plan Current plan remains appropriate    Co-evaluation              AM-PAC PT "6 Clicks" Mobility   Outcome Measure  Help needed turning from your back to your side while in a flat bed without using bedrails?: Total Help needed moving from lying on your back to sitting on the side of a flat bed without using bedrails?: Total Help needed moving to and from a bed to a chair (including a wheelchair)?: Total Help needed standing up from a chair using your arms (e.g., wheelchair or bedside chair)?: Total Help needed to walk in hospital room?: Total Help needed climbing 3-5 steps with a railing? : Total 6 Click Score: 6    End of Session   Activity Tolerance: Patient tolerated treatment well Patient left: with call bell/phone within reach;in chair;with chair alarm set (L PRAFO donned) Nurse Communication: Mobility status PT Visit Diagnosis: Hemiplegia and hemiparesis;Other abnormalities of gait and mobility (R26.89);Difficulty in walking, not elsewhere classified (R26.2);Other symptoms and signs involving the nervous system (R29.898);Muscle weakness (generalized) (M62.81) Hemiplegia - Right/Left: Left Hemiplegia - dominant/non-dominant: Non-dominant Hemiplegia - caused by: Cerebral infarction     Time: 1200-1227 PT Time Calculation (min) (ACUTE ONLY): 27 min  Charges:  $Therapeutic Activity: 23-37 mins                    Marye Round, PT DPT Acute Rehabilitation Services Pager [phone]  Office [phone]  Tyrone Apple E Christain [place] 04/29/2021, 3:47 PM

## 2021-04-30 DIAGNOSIS — I6601 Occlusion and stenosis of right middle cerebral artery: Secondary | ICD-10-CM | POA: Diagnosis not present

## 2021-04-30 DIAGNOSIS — I63511 Cerebral infarction due to unspecified occlusion or stenosis of right middle cerebral artery: Secondary | ICD-10-CM | POA: Diagnosis not present

## 2021-04-30 LAB — GLUCOSE, CAPILLARY
Glucose-Capillary: 101 mg/dL — ABNORMAL HIGH (ref 70–99)
Glucose-Capillary: 104 mg/dL — ABNORMAL HIGH (ref 70–99)
Glucose-Capillary: 107 mg/dL — ABNORMAL HIGH (ref 70–99)
Glucose-Capillary: 108 mg/dL — ABNORMAL HIGH (ref 70–99)
Glucose-Capillary: 95 mg/dL (ref 70–99)
Glucose-Capillary: 96 mg/dL (ref 70–99)

## 2021-04-30 LAB — PROTIME-INR
INR: 3.4 — ABNORMAL HIGH (ref 0.8–1.2)
Prothrombin Time: 34.4 seconds — ABNORMAL HIGH (ref 11.4–15.2)

## 2021-04-30 MED ORDER — WARFARIN SODIUM 7.5 MG PO TABS
7.5000 mg | ORAL_TABLET | Freq: Once | ORAL | Status: AC
  Administered 2021-04-30: 7.5 mg
  Filled 2021-04-30: qty 1

## 2021-04-30 NOTE — Progress Notes (Signed)
STROKE TEAM PROGRESS NOTE   INTERVAL HISTORY No family at bedside.  Pt lying in bed, awake alert, smiling to provider, however, global aphasia, not following commands. INR 3.4 today, difficulty placement to SNF.   Vitals:   04/30/21 0500 04/30/21 0717 04/30/21 1104 04/30/21 1518  BP:  122/74 111/89 123/71  Pulse:  95 100 99  Resp:  16 20 20   Temp:  100 F (37.8 C) 99.1 F (37.3 C) 98.7 F (37.1 C)  TempSrc:  Axillary Axillary Axillary  SpO2:  98% 96% 98%  Weight: 108.6 kg     Height:       CBC:  Recent Labs  Lab 04/27/21 0637 04/29/21 0451  WBC 10.3 10.2  HGB 12.1 11.8*  HCT 38.7 38.8  MCV 91.5 92.8  PLT 293 303   Basic Metabolic Panel:  Recent Labs  Lab 04/27/21 0637 04/29/21 0451  NA 137 137  K 4.2 3.2*  CL 105 103  CO2 22 26  GLUCOSE 101* 98  BUN 22* 25*  CREATININE 0.72 0.71  CALCIUM 9.4 9.4   IMAGING past 24 hours No results found.  PHYSICAL EXAM  Temp:  [98.7 F (37.1 C)-100 F (37.8 C)] 98.7 F (37.1 C) (05/19 1518) Pulse Rate:  [86-100] 99 (05/19 1518) Resp:  [16-20] 20 (05/19 1518) BP: (110-125)/(60-89) 123/71 (05/19 1518) SpO2:  [94 %-99 %] 98 % (05/19 1518) Weight:  [108.6 kg] 108.6 kg (05/19 0500)  General - morbid obesity, well developed, in no apparent distress.  Resp: No extra work of breathing.  Cardiovascular - Regular rhythm and rate. Neuro - Eyes open, awake alert, nonverbal, not following commands. Right gaze preference, but able to cross midline and left gaze incomplete.  Blinking to visual threat on the right but not on the left. Left facial droop. Left upper extremity 0/5 but significantly increased muscle tone, left LE slight withdraw to pain but also with increased muscle tone. Hypohophonic vocalization with grunt which seemed to be attempt at speech.Right upper extremity spontaneous movement against gravity. Right lower extremity at least 3/5 spontaneously. Sensation, coordination and gait not able to be  tested.   ASSESSMENT/PLAN [name] is a 35 y.o. female with history of multiple strokes who presented as a code stroke with symptoms suggestive of LVO. tPA was not given due to Eliquis. CTA head and neck revealed an occlusion of distal right M2 MCA with partial reconstitution and subsequent reocclusion of most M2 branches. Patient underwent thrombectomy.    Recurrent cryptogenic embolic strokes - Acute infarct of the R MCA due to right M1 occlusion s/p IR with TICI2c, cryptogenic etiology   Code Stroke: CT head No acute intracranial hemorrhage. Acute infarcts in the right MCA territory. ASPECTS 8.    CTA head & neck Occlusion of distal right M1 MCA with partial reconstitution and subsequent reocclusion of most M2 branches. Poor collaterals.  CT Perfusion imaging demonstrates core infarction 99 mL and penumbra of 34 mL. The extent of core infarction is calculated at greater than apparent loss of gray-white differentiation on noncontrast head CT. Therefore, penumbra may be greater.  2D Echo LVEF 60-65%, no structural abnormalities noted that could increase risk for CVA  Repeat CT Head 4/24 - large left MCA infarct, no midline shift. Small SAH sylvian fissure, diminishing in size  Hypercoagulable panel: Homocysteine 15.1 due to smoking, otherwise negative to date  LDL 48  HgbA1c 5.7  VTE prophylaxis - SCDs + Lovenox  aspirin 325 mg daily and Eliquis (apixaban)  daily prior to admission, now on ASA 325 and coumadin with INR goal 2.5-3.5.  INR 2.4->2.7->2.5->2.5->2.5->3.2->3.4  Therapy recommendations:  SNF  Disposition:  Pending  Hx of cryptogenic strokes  In 08/2017 patient admitted for left sided numbness and headache.  MRI showed right cerebellum, right temporoparietal small infarcts.  EF 55 to 60%, CT head and neck unremarkable.  LDL 89, A1c 4.8.  TCD bubble study showed trivial PFO.  DVT negative.  TEE no PFO.  Hypercoagulable and autoimmune work-up negative.   Discharged with aspirin and Lipitor.  01/2018 episode of left facial numbness and left leg weakness.  CT head and neck unremarkable.  MRI negative.  06/2018 admitted to [facility] for right hand/face numbness.  MRI showed right tiny parietal infarct.  10/2020 admitted to [facility] for aphasia status post tPA.  CTA neck showed left M2 occlusion.  MRI showed left MCA and punctate right frontal infarct.  TCD bubble study potential trivial PFO, TEE tiny PFO.  11/2020 she was referred to loop recorder which was not done as physician thought she should be candidate for anticoagulation.  Started on Eliquis 5 mg twice daily.  Skin rash with blisters  Left flank skin lesion with blisters   Was concerning for shingles  Varicella zoster PCR negative, spoke with IC RN and precautions discontinued 5/6.   Off contact and airborne isolation  Valtrex discontinued  WOC on board for dressing  Leukocytosis, resolved  Low grade ?postop fever 100 5/5 overnight with slight increase in WBC to 12, monitor   WBC 11.7>9.8->10.0->10.9->10.0->12->9.2->10.2  UA negative   Tachycardia, resolved  Labtealol 100mg  BID  HR 90s   Hyperlipidemia  Home meds:  atrovastatin 80mg  and zetia 10mg    Will restart post CT if stable  LDL 48, goal < 70  Resumed lipitor 80 and zetia 10  Continue statin at discharge   Dysphagia- DYS diet 1  Cortrack placement for TF  SLP and RD following  Zero intake x 3 day calorie count on nocturnal feedings alone.   S/p PEG    On TF  Birth control  Recommend OP PCP or OBGYN discontinue Depo - provera due to numerous Contraindications including hx of tobacco use, migraines, and multiple strokes.    Tobacco abuse  Current smoker  Smoking cessation counseling will be provided  Contraindicated for birth control Depo   Hx of pseudotumor cerebri Chronic migraine  Follows [facility]. at [facility]  On topamax 150mg  bid PTA and resumed during this admission  10/2017 MRV  showed left transverse sinus hypoplastic.   Other Stroke Risk Factors  Morbid Obesity, Body mass index is 42.12 kg/m., BMI >/= 30 associated with increased stroke risk, recommend weight loss, diet and exercise as appropriate   Obstructive sleep apnea   Hypokalemia K 3.2 -> supplement  Hospital day # 28    Terrace Arabia, MD PhD Stroke Neurology 04/30/2021 7:28 PM

## 2021-04-30 NOTE — Progress Notes (Signed)
ANTICOAGULATION CONSULT NOTE  Pharmacy Consult for warfarin Indication:  Acute stroke  Allergies  Allergen Reactions  . Tape Rash    Cannot tolerate paper tape    Patient Measurements: Height: 5\' 5"  (165.1 cm) Weight: 108.6 kg (239 lb 6.7 oz) IBW/kg (Calculated) : 57  Heparin dosing weight: 72 kg    Vital Signs: Temp: 99.1 F (37.3 C) (05/19 1104) Temp Source: Axillary (05/19 1104) BP: 111/89 (05/19 1104) Pulse Rate: 100 (05/19 1104)  Labs: Recent Labs    04/28/21 0105 04/29/21 0451 04/30/21 0036  HGB  --  11.8*  --   HCT  --  38.8  --   PLT  --  303  --   LABPROT 27.3* 32.4* 34.4*  INR 2.5* 3.2* 3.4*  CREATININE  --  0.71  --     Estimated Creatinine Clearance: 121.4 mL/min (by C-G formula based on SCr of 0.71 mg/dL).  Assessment: 35 y.o female on apixaban PTA for hx prior strokes, ?DVT. Last dose PTA " unknown," admitted 04/02/21 with acute CVA. No TPA due to apixaban pta.   -4/21 s/p thrombectomy. -Neurologist consulted pharmacist on 04/08/21 to start warfarin with goal INR 2.5-3.5 per Dr. 04/10/21.  INR today remains therapeutic at 3.4 (goal of 2.5-3.5). Noted early morning bleeding on 5/13 around PEG tube site requiring change in dressing and abd binder - none since documented. CBC stable.  Goal of Therapy:  INR 2.5-3.5 per Neurologist  Monitor platelets by anticoagulation protocol: Yes   Plan:  - Warfarin 7.5mg  x 1 dose at 1600 today - Monitor daily INR, CBC, s/sx bleeding   6/13, PharmD, BCPS Please check AMION for all [facility] Pharmacy contact numbers Clinical Pharmacist 04/30/2021 1:30 PM

## 2021-05-01 DIAGNOSIS — I6601 Occlusion and stenosis of right middle cerebral artery: Secondary | ICD-10-CM | POA: Diagnosis not present

## 2021-05-01 DIAGNOSIS — I6340 Cerebral infarction due to embolism of unspecified cerebral artery: Secondary | ICD-10-CM | POA: Diagnosis not present

## 2021-05-01 DIAGNOSIS — I63511 Cerebral infarction due to unspecified occlusion or stenosis of right middle cerebral artery: Secondary | ICD-10-CM | POA: Diagnosis not present

## 2021-05-01 LAB — BASIC METABOLIC PANEL
Anion gap: 6 (ref 5–15)
BUN: 21 mg/dL — ABNORMAL HIGH (ref 6–20)
CO2: 24 mmol/L (ref 22–32)
Calcium: 9.1 mg/dL (ref 8.9–10.3)
Chloride: 107 mmol/L (ref 98–111)
Creatinine, Ser: 0.69 mg/dL (ref 0.44–1.00)
GFR, Estimated: >60 mL/min (ref >60)
Glucose, Bld: 105 mg/dL — ABNORMAL HIGH (ref 70–99)
Potassium: 3.8 mmol/L (ref 3.5–5.1)
Sodium: 137 mmol/L (ref 135–145)

## 2021-05-01 LAB — GLUCOSE, CAPILLARY
Glucose-Capillary: 113 mg/dL — ABNORMAL HIGH (ref 70–99)
Glucose-Capillary: 119 mg/dL — ABNORMAL HIGH (ref 70–99)
Glucose-Capillary: 115 mg/dL — ABNORMAL HIGH (ref 70–99)
Glucose-Capillary: 94 mg/dL (ref 70–99)
Glucose-Capillary: 88 mg/dL (ref 70–99)
Glucose-Capillary: 96 mg/dL (ref 70–99)

## 2021-05-01 LAB — CBC
HCT: 36.6 % (ref 36.0–46.0)
Hemoglobin: 11.5 g/dL — ABNORMAL LOW (ref 12.0–15.0)
MCH: 28.7 pg (ref 26.0–34.0)
MCHC: 31.4 g/dL (ref 30.0–36.0)
MCV: 91.3 fL (ref 80.0–100.0)
Platelets: 251 10*3/uL (ref 150–400)
RBC: 4.01 MIL/uL (ref 3.87–5.11)
RDW: 15.3 % (ref 11.5–15.5)
WBC: 8.8 10*3/uL (ref 4.0–10.5)
nRBC: 0 % (ref 0.0–0.2)

## 2021-05-01 LAB — MAGNESIUM: Magnesium: 2 mg/dL (ref 1.7–2.4)

## 2021-05-01 LAB — PROTIME-INR
INR: 3.1 — ABNORMAL HIGH (ref 0.8–1.2)
Prothrombin Time: 31.6 seconds — ABNORMAL HIGH (ref 11.4–15.2)

## 2021-05-01 MED ORDER — WARFARIN SODIUM 5 MG PO TABS
10.0000 mg | ORAL_TABLET | Freq: Once | ORAL | Status: AC
  Administered 2021-05-01: 10 mg
  Filled 2021-05-01: qty 2

## 2021-05-01 NOTE — Progress Notes (Signed)
Physical Therapy Treatment Patient Details Name: Krista Maddox MRN: [id number] DOB: July 02, 1986 Today's Date: 05/01/2021    History of Present Illness The pt is a 35 yo female presenting with L sided facial droop and L sided weakness on 4/21. Underwent angiogram and revascularization on 4/21. CT head showing large right MCA territory infarct with 4 mm leftward midline shift. S/p PEG 5/5. 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    PT Comments    The pt was agreeable to session with PT/OT for improved safety with OOB mobility. The pt was initially able to demo good improvement in seated balance, maintaining static sitting with minG only, and able to engage in simple self-care tasks while maintaining balance with minG and intermittent minA. The pt's balance doe require increased assist with fatigue, progressing to modA and at times max due to decreased attention and fatigue. The pt was unable to progress to OOB transfers at this time due to inability to follow commands and difficulty maintaining attention to task, will continue to benefit from skilled PT to progress functional stability and increase participation in transfers.     Follow Up Recommendations  SNF;Supervision/Assistance - 24 hour     Equipment Recommendations  None recommended by PT    Recommendations for Other Services       Precautions / Restrictions Precautions Precautions: Fall Precaution Comments: abdominal binder; peg Required Braces or Orthoses: Other Brace Other Brace: L PRAFO Restrictions Weight Bearing Restrictions: No    Mobility  Bed Mobility Overal bed mobility: Needs Assistance Bed Mobility: Rolling Rolling: +2 for physical assistance;Max assist Sidelying to sit: Max assist;+2 for physical assistance Supine to sit: Mod assist;+2 for physical assistance     General bed mobility comments: Able to reach for bed rail to assist. Resistive at times but  appear due to pts significant deficits with motor  planning and communication deficits. when moving back into sidelying from sitting EOB, pt intiated mobility back to bed, requiring assitance to manage LUE adn lift L leg back onto bed    Transfers                 General transfer comment: not attempted      Modified Rankin (Stroke Patients Only) Modified Rankin (Stroke Patients Only) Pre-Morbid Rankin Score: Moderate disability Modified Rankin: Severe disability     Balance Overall balance assessment: Needs assistance Sitting-balance support: Feet supported;Single extremity supported Sitting balance-Leahy Scale: Fair Sitting balance - Comments: able to sit unsupported . Initially reaching for bedrail on R. Bedrail removed and pt able to maintain midline postural control. Postural control appear to be affected by attentional deficts as pt begins to demosntrate a L lateral lean. Pt will attempt to achieve midline with significant delay in processing. Postural control: Posterior lean;Left lateral lean                                  Cognition Arousal/Alertness: Awake/alert Behavior During Therapy: Flat affect Overall Cognitive Status: Impaired/Different from baseline Area of Impairment: Attention;Following commands;Awareness;Problem solving;Safety/judgement                   Current Attention Level: Sustained   Following Commands: Follows one step commands inconsistently Safety/Judgement: Decreased awareness of safety;Decreased awareness of deficits Awareness: Intellectual Problem Solving: Slow processing;Decreased initiation;Difficulty sequencing;Requires verbal cues;Requires tactile cues General Comments: Appears apraxic; smiled at her Hippo stuffed animal;  smiled at the picture of her daughter; became tearful at end of sessionas if frustrated with her deficits      Exercises Other Exercises Other Exercises: cervical rotation x5 bilaterally Other  Exercises: cervical lateral flexion x5 bilaterally Other Exercises: seated trunk rotation x3 bilaterally    General Comments General comments (skin integrity, edema, etc.): VSS on RA      Pertinent Vitals/Pain Pain Assessment: Faces Faces Pain Scale: Hurts a little bit Pain Location: R lower back Pain Descriptors / Indicators: Discomfort;Grimacing;Guarding Pain Intervention(s): Limited activity within patient's tolerance;Monitored during session           PT Goals (current goals can now be found in the care plan section) Acute Rehab PT Goals Patient Stated Goal: pt unable to state PT Goal Formulation: Patient unable to participate in goal setting Time For Goal Achievement: 05/11/21 Potential to Achieve Goals: Fair Progress towards PT goals: Progressing toward goals    Frequency    Min 3X/week      PT Plan Current plan remains appropriate    Co-evaluation PT/OT/SLP Co-Evaluation/Treatment: Yes Reason for Co-Treatment: Complexity of the patient's impairments (multi-system involvement);For patient/therapist safety;To address functional/ADL transfers PT goals addressed during session: Mobility/safety with mobility;Balance;Strengthening/ROM OT goals addressed during session: ADL's and self-care;Strengthening/ROM      AM-PAC PT "6 Clicks" Mobility   Outcome Measure  Help needed turning from your back to your side while in a flat bed without using bedrails?: Total Help needed moving from lying on your back to sitting on the side of a flat bed without using bedrails?: Total Help needed moving to and from a bed to a chair (including a wheelchair)?: Total Help needed standing up from a chair using your arms (e.g., wheelchair or bedside chair)?: Total Help needed to walk in hospital room?: Total Help needed climbing 3-5 steps with a railing? : Total 6 Click Score: 6    End of Session   Activity Tolerance: Patient tolerated treatment well Patient left: with call  bell/phone within reach;in chair;with chair alarm set;with SCD's reapplied (L PRAFO donned) Nurse Communication: Mobility status PT Visit Diagnosis: Hemiplegia and hemiparesis;Other abnormalities of gait and mobility (R26.89);Difficulty in walking, not elsewhere classified (R26.2);Other symptoms and signs involving the nervous system (R29.898);Muscle weakness (generalized) (M62.81) Hemiplegia - Right/Left: Left Hemiplegia - dominant/non-dominant: Non-dominant Hemiplegia - caused by: Cerebral infarction     Time: 1140-1201 PT Time Calculation (min) (ACUTE ONLY): 21 min  Charges:  $Therapeutic Activity: 8-22 mins                     Rolm Baptise, PT, DPT   Acute Rehabilitation Department Pager #: [phone]   Krista Maddox 05/01/2021, 6:06 PM

## 2021-05-01 NOTE — Progress Notes (Signed)
ANTICOAGULATION CONSULT NOTE  Pharmacy Consult for warfarin Indication:  Acute stroke  Allergies  Allergen Reactions  . Tape Rash    Cannot tolerate paper tape    Patient Measurements: Height: 5\' 5"  (165.1 cm) Weight: 109.3 kg (240 lb 15.4 oz) IBW/kg (Calculated) : 57  Heparin dosing weight: 72 kg    Vital Signs: Temp: 98.5 F (36.9 C) (05/20 1120) Temp Source: Oral (05/20 1120) BP: 114/76 (05/20 1120) Pulse Rate: 94 (05/20 1120)  Labs: Recent Labs    04/29/21 0451 04/30/21 0036 05/01/21 0511  HGB 11.8*  --  11.5*  HCT 38.8  --  36.6  PLT 303  --  251  LABPROT 32.4* 34.4* 31.6*  INR 3.2* 3.4* 3.1*  CREATININE 0.71  --  0.69    Estimated Creatinine Clearance: 121.9 mL/min (by C-G formula based on SCr of 0.69 mg/dL).  Assessment: 35 y.o female on apixaban PTA for hx prior strokes, ?DVT. Last dose PTA " unknown," admitted 04/02/21 with acute CVA. No TPA due to apixaban pta.   -4/21 s/p thrombectomy. -Neurologist consulted pharmacist on 04/08/21 to start warfarin with goal INR 2.5-3.5 per Dr. 04/10/21.  INR today remains therapeutic at 3.1 (goal of 2.5-3.5). CBC stable.  Goal of Therapy:  INR 2.5-3.5 per Neurologist  Monitor platelets by anticoagulation protocol: Yes   Plan:  - Warfarin 10 mg x 1 dose at 1600 today - Monitor daily INR, CBC, s/sx bleeding  Thank you for allowing pharmacy to be a part of this patient's care.  Morrie Sheldon, PharmD, BCPS Clinical Pharmacist Clinical phone for 05/01/2021: [phone] 05/01/2021 12:40 PM   **Pharmacist phone directory can now be found on [email or web address] ([email or web address]).  Listed under [facility].

## 2021-05-01 NOTE — Progress Notes (Signed)
Occupational Therapy Treatment Patient Details Name: Krista Maddox MRN: [id number] DOB: 11/16/1986 Today's Date: 05/01/2021    History of present illness The pt is a 34 yo female presenting with L sided facial droop and L sided weakness on 4/21. Underwent angiogram and revascularization on 4/21. CT head showing large right MCA territory infarct with 4 mm leftward midline shift. S/p PEG 5/5. 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   OT comments  Prior to this hospitalization Krista Maddox was completely independent with ADL and IADL tasks, including driving. She was participating in ST in the outpatient setting for "minimal slurred speech...almost back to normal." She has a 15 yo daughter "Krista Maddox" and enjoyed watching TV, likes all music and "[employer]". Pt seen in conjunction with PT today. Able to progress to EOB and sit unsupported for at least 2 minutes until posture affected by attentional deficits. Pt demonstrates a L bias, but able to regain midline postural positioning with increased time for processing. 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  Improved performance with repetition. Pt smiled at stuffed animal and began crying at end of session/appeared to demonstrate awareness of the significance of her deficits. Feel pt will benefit from consistent therapists in attempts to make progress toward goals. Discussed pt's status with her father who as very appreciative for update. Will continue to follow acutely. Recommend post acute rehab.   Follow Up Recommendations  SNF;Supervision/Assistance - 24 hour    Equipment Recommendations  3 in 1 bedside commode    Recommendations for Other Services      Precautions / Restrictions Precautions Precautions: Fall Precaution Comments: abdominal binder; peg Required Braces or Orthoses: Other Brace Other Brace: L PRAFO        Mobility Bed Mobility Overal bed mobility: Needs Assistance   Rolling: +2 for physical assistance;Max assist Sidelying to sit: Max assist;+2 for physical assistance Supine to sit: Mod assist;+2 for physical assistance     General bed mobility comments: Able to reach for bed rail to assist. Resistive at times but appears due to pts significant deficits with motor  planning and communication deficits. when moving back into sidelying from sitting EOB, pt intiated mobility back to bed, requiring assitance to manage LUE and lift L leg back onto bed    Transfers                 General transfer comment: not attempted    Balance     Sitting balance-Leahy Scale: Fair Sitting balance - Comments: able to sit unsupported . Initially reaching for bedrail on R. Bedrail removed and pt able to maintain midline postural control. Postural control appear to be affected by attentional deficts as pt begins to demosntrate a L lateral lean. Pt will attempt to achieve midline with significant delay in processing.                                   ADL either performed or assessed with clinical judgement   ADL         Grooming Details (indicate cue type and reason): washed face with min physical assist for initiation; reached up to hair adn appeasr to realize it is matted                     Toileting- Clothing Manipulation and Hygiene: Total   assistance               Vision   Additional Comments: R gaze preference however attending to stimuli on L. able to sustain gaze at midline   Perception     Praxis      Cognition Arousal/Alertness: Awake/alert Behavior During Therapy: Flat affect Overall Cognitive Status: Impaired/Different from baseline Area of Impairment: Attention;Following commands;Awareness;Problem solving;Safety/judgement                   Current Attention Level: Sustained   Following Commands: Follows one step commands  inconsistently Safety/Judgement: Decreased awareness of safety;Decreased awareness of deficits Awareness: Intellectual Problem Solving: Slow processing;Decreased initiation;Difficulty sequencing;Requires verbal cues;Requires tactile cues General Comments: Appears apraxic; smiled at her Hipp[employer] stuffed animal; smiled at the picture of her daughter; became tearful at end of sessionas if frustrated with her deficits        Exercises     Shoulder Instructions       General Comments      Pertinent Vitals/ Pain       Pain Assessment: Faces Faces Pain Scale: Hurts a little bit Pain Location: R lower back Pain Descriptors / Indicators: Discomfort;Grimacing;Guarding Pain Intervention(s): Limited activity within patient's tolerance  Home Living                                          Prior Functioning/Environment              Frequency  Min 2X/week        Progress Toward Goals  OT Goals(current goals can now be found in the care plan section)  Progress towards OT goals: Progressing toward goals;Goals met and updated - see care plan  Acute Rehab OT Goals Patient Stated Goal: pt unable to state OT Goal Formulation: Patient unable to participate in goal setting Time For Goal Achievement: 05/15/21 Potential to Achieve Goals: Fair ADL Goals Pt Will Perform Grooming: with max assist;sitting Additional ADL Goal #1: Pt will use ADL object appropriately with mod A for hand over hand initiation in non-distracting environment Additional ADL Goal #2: Goal met 5/20 Additional ADL Goal #3: goal met 5/20 Additional ADL Goal #4: goal met 5/20  Plan Discharge plan remains appropriate;Frequency remains appropriate    Co-evaluation    PT/OT/SLP Co-Evaluation/Treatment: Yes Reason for Co-Treatment: Complexity of the patient's impairments (multi-system involvement);For patient/therapist safety;To address functional/ADL transfers   OT goals addressed during session:  ADL's and self-care;Strengthening/ROM      AM-PAC OT "6 Clicks" Daily Activity     Outcome Measure   Help from another person eating meals?: Total Help from another person taking care of personal grooming?: A Lot Help from another person toileting, which includes using toliet, bedpan, or urinal?: Total Help from another person bathing (including washing, rinsing, drying)?: Total Help from another person to put on and taking off regular upper body clothing?: Total Help from another person to put on and taking off regular lower body clothing?: Total 6 Click Score: 7    End of Session    OT Visit Diagnosis: Unsteadiness on feet (R26.81);Other abnormalities of gait and mobility (R26.89);Muscle weakness (generalized) (M62.81);Low vision, both eyes (H54.2);Apraxia (R48.2);Other symptoms and signs involving cognitive function;Pain;Hemiplegia and hemiparesis Symptoms and signs involving cognitive functions: Cerebral infarction Hemiplegia - Right/Left: Left Hemiplegia - dominant/non-dominant: Non-Dominant Hemiplegia - caused by: Cerebral infarction Pain - Right/Left: Right Pain - part   of body:  (?back)   Activity Tolerance Patient tolerated treatment well   Patient Left in bed;with call bell/phone within reach;with bed alarm set;with SCD's reapplied;with restraints reapplied   Nurse Communication Mobility status        Time: 1135-1210 OT Time Calculation (min): 35 min  Charges: OT General Charges $OT Visit: 1 Visit OT Treatments $Neuromuscular Re-education: 8-22 mins   , OT/L   Acute OT Clinical Specialist Acute Rehabilitation Services Pager [phone] Office [phone]    ,HILLARY 05/01/2021, 3:33 PM    

## 2021-05-01 NOTE — Progress Notes (Addendum)
STROKE TEAM PROGRESS NOTE   INTERVAL HISTORY INR 3.5 today. No acute events.   Krista Maddox is neurologically stable awaiting placement to SNF for a few weeks now in difficult to place scenario. Today she is alert, lying in bed with TV on but not attentive to TV. She is not attempting to verbalize today.   Vitals:   05/01/21 0610 05/01/21 0718 05/01/21 1120 05/01/21 1520  BP:  122/70 114/76 101/79  Pulse:  88 94 90  Resp:  16 20 20   Temp:  98.3 F (36.8 C) 98.5 F (36.9 C) 98.3 F (36.8 C)  TempSrc:  Oral Oral Axillary  SpO2:  98% 98% 97%  Weight: 109.3 kg     Height:       CBC:  Recent Labs  Lab 04/29/21 0451 05/01/21 0511  WBC 10.2 8.8  HGB 11.8* 11.5*  HCT 38.8 36.6  MCV 92.8 91.3  PLT 303 251   Basic Metabolic Panel:  Recent Labs  Lab 04/29/21 0451 05/01/21 0511  NA 137 137  K 3.2* 3.8  CL 103 107  CO2 26 24  GLUCOSE 98 105*  BUN 25* 21*  CREATININE 0.71 0.69  CALCIUM 9.4 9.1  MG  --  2.0   IMAGING past 24 hours No results found.  PHYSICAL EXAM  Temp:  [98.3 F (36.8 C)-99.2 F (37.3 C)] 98.3 F (36.8 C) (05/20 1520) Pulse Rate:  [82-94] 90 (05/20 1520) Resp:  [16-20] 20 (05/20 1520) BP: (101-125)/(70-85) 101/79 (05/20 1520) SpO2:  [97 %-99 %] 97 % (05/20 1520) Weight:  [109.3 kg] 109.3 kg (05/20 0610)  General - morbid obesity, well developed, in no apparent distress.  Resp: No extra work of breathing.  Cardiovascular - Regular rhythm and rate. Neuro - Eyes open, awake alert, nonverbal, not following commands. Right gaze preference, but able to cross midline and left gaze incomplete.  Blinking to visual threat on the right but not on the left. Left facial droop. Left upper extremity 0/5 but significantly increased muscle tone, left LE slight withdraw to pain but also with increased muscle tone. Hypohophonic vocalization with grunt which seemed to be attempt at speech.Right upper extremity spontaneous movement against gravity. Right lower extremity  at least 3/5 spontaneously. Sensation, coordination and gait not able to be tested.   ASSESSMENT/PLAN [name] is a 35 y.o. female with history of multiple strokes who presented as a code stroke with symptoms suggestive of LVO. tPA was not given due to Eliquis. CTA head and neck revealed an occlusion of distal right M2 MCA with partial reconstitution and subsequent reocclusion of most M2 branches. Patient underwent thrombectomy.    Recurrent cryptogenic embolic strokes - Acute infarct of the R MCA due to right M1 occlusion s/p IR with TICI2c, cryptogenic etiology   Code Stroke: CT head No acute intracranial hemorrhage. Acute infarcts in the right MCA territory. ASPECTS 8.    CTA head & neck Occlusion of distal right M1 MCA with partial reconstitution and subsequent reocclusion of most M2 branches. Poor collaterals.  CT Perfusion imaging demonstrates core infarction 99 mL and penumbra of 34 mL. The extent of core infarction is calculated at greater than apparent loss of gray-white differentiation on noncontrast head CT. Therefore, penumbra may be greater.  2D Echo LVEF 60-65%, no structural abnormalities noted that could increase risk for CVA  Repeat CT Head 4/24 - large left MCA infarct, no midline shift. Small SAH sylvian fissure, diminishing in size  Hypercoagulable panel: Homocysteine 15.1 due  to smoking, otherwise negative to date  LDL 48  HgbA1c 5.7  VTE prophylaxis - SCDs + Lovenox  aspirin 325 mg daily and Eliquis (apixaban) daily prior to admission, now on ASA 325 and coumadin with INR goal 2.5-3.5.  INR 2.4->2.7->2.5->2.5->2.5->3.2->3.4  Therapy recommendations:  SNF  Disposition:  Pending  Hx of cryptogenic strokes  In 08/2017 patient admitted for left sided numbness and headache.  MRI showed right cerebellum, right temporoparietal small infarcts.  EF 55 to 60%, CT head and neck unremarkable.  LDL 89, A1c 4.8.  TCD bubble study showed trivial PFO.  DVT  negative.  TEE no PFO.  Hypercoagulable and autoimmune work-up negative.  Discharged with aspirin and Lipitor.  01/2018 episode of left facial numbness and left leg weakness.  CT head and neck unremarkable.  MRI negative.  06/2018 admitted to [facility] for right hand/face numbness.  MRI showed right tiny parietal infarct.  10/2020 admitted to [facility] for aphasia status post tPA.  CTA neck showed left M2 occlusion.  MRI showed left MCA and punctate right frontal infarct.  TCD bubble study potential trivial PFO, TEE tiny PFO.  11/2020 she was referred to loop recorder which was not done as physician thought she should be candidate for anticoagulation.  Started on Eliquis 5 mg twice daily.  Skin rash with blisters  Left flank skin lesion with blisters   Was concerning for shingles  Varicella zoster PCR negative, spoke with IC RN and precautions discontinued 5/6.   Off contact and airborne isolation  Valtrex discontinued  WOC on board for dressing  Leukocytosis, resolved  Low grade ?postop fever 100 5/5 overnight with slight increase in WBC to 12, monitor   WBC 11.7>9.8->10.0->10.9->10.0->12->9.2->10.2  UA negative   Tachycardia, resolved  Labtealol 100mg  BID  HR 90s   Hyperlipidemia  Home meds:  atrovastatin 80mg  and zetia 10mg    Will restart post CT if stable  LDL 48, goal < 70  Resumed lipitor 80 and zetia 10  Continue statin at discharge   Dysphagia- DYS diet 1  Cortrack placement for TF  SLP and RD following  Zero intake x 3 day calorie count on nocturnal feedings alone.   S/p PEG    On TF  Birth control  Recommend OP PCP or OBGYN discontinue Depo - provera due to numerous Contraindications including hx of tobacco use, migraines, and multiple strokes.    Tobacco abuse  Current smoker  Smoking cessation counseling will be provided  Contraindicated for birth control Depo   Hx of pseudotumor cerebri Chronic migraine  Follows [facility] Dr. 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   Other Stroke Risk Factors  Morbid Obesity, Body mass index is 42.12 kg/m., BMI >/= 30 associated with increased stroke risk, recommend weight loss, diet and exercise as appropriate   Obstructive sleep apnea   Hypokalemia K 3.2 -> supplement  Hospital day # 29   ATTENDING NOTE: I reviewed above note and agree with the assessment and plan. Pt was seen and examined.   No acute event overnight, patient neuro stable unchanged.  INR today 3.1.  Still pending SNF, patient is difficult placement.  For detailed assessment and plan, please refer to above as I have made changes wherever appropriate.   [facility], MD PhD Stroke Neurology 05/01/2021 6:52 PM

## 2021-05-02 DIAGNOSIS — I63511 Cerebral infarction due to unspecified occlusion or stenosis of right middle cerebral artery: Secondary | ICD-10-CM | POA: Diagnosis not present

## 2021-05-02 LAB — GLUCOSE, CAPILLARY
Glucose-Capillary: 102 mg/dL — ABNORMAL HIGH (ref 70–99)
Glucose-Capillary: 80 mg/dL (ref 70–99)
Glucose-Capillary: 83 mg/dL (ref 70–99)
Glucose-Capillary: 92 mg/dL (ref 70–99)
Glucose-Capillary: 93 mg/dL (ref 70–99)
Glucose-Capillary: 95 mg/dL (ref 70–99)

## 2021-05-02 LAB — PROTIME-INR
INR: 3 — ABNORMAL HIGH (ref 0.8–1.2)
Prothrombin Time: 31.2 seconds — ABNORMAL HIGH (ref 11.4–15.2)

## 2021-05-02 MED ORDER — BISACODYL 10 MG RE SUPP: 10.0000 mg | Freq: Once | RECTAL | Status: DC

## 2021-05-02 MED ORDER — BISACODYL 10 MG RE SUPP: 10.0000 mg | Freq: Every day | RECTAL | Status: DC | PRN

## 2021-05-02 MED ORDER — WARFARIN SODIUM 5 MG PO TABS
10.0000 mg | ORAL_TABLET | Freq: Once | ORAL | Status: AC
  Administered 2021-05-02: 10 mg
  Filled 2021-05-02: qty 2

## 2021-05-02 MED ORDER — POLYETHYLENE GLYCOL 3350 17 G PO PACK
17.0000 g | PACK | Freq: Every day | ORAL | Status: DC
  Administered 2021-05-02 – 2021-05-04 (×3): 17 g via ORAL
  Filled 2021-05-02 (×3): qty 1

## 2021-05-02 NOTE — Progress Notes (Addendum)
STROKE TEAM PROGRESS NOTE   INTERVAL HISTORY INR 3.0 today. No acute events.  Today Krista Maddox is tearful when I arrive. She is not attempting to verbalize. Eliab RN is at bedside administering medications via PEG. Her neurologic exam is unchanged. She is not guarding or wincing or otherwise nonverbally indicating pain during exam. VSS. Eliab RN agrees to monitor and report if symptoms of concern develop.   Vitals:   05/02/21 0329 05/02/21 0431 05/02/21 0719 05/02/21 1119  BP: 114/72  120/70 134/76  Pulse: 86  86 98  Resp: 19  19 14   Temp: 98.8 F (37.1 C)  99.9 F (37.7 C) 98.2 F (36.8 C)  TempSrc: Axillary  Axillary Oral  SpO2: 97%  98% 99%  Weight:  109.3 kg    Height:       CBC:  Recent Labs  Lab 04/29/21 0451 05/01/21 0511  WBC 10.2 8.8  HGB 11.8* 11.5*  HCT 38.8 36.6  MCV 92.8 91.3  PLT 303 251   Basic Metabolic Panel:  Recent Labs  Lab 04/29/21 0451 05/01/21 0511  NA 137 137  K 3.2* 3.8  CL 103 107  CO2 26 24  GLUCOSE 98 105*  BUN 25* 21*  CREATININE 0.71 0.69  CALCIUM 9.4 9.1  MG  --  2.0   IMAGING past 24 hours No results found.  PHYSICAL EXAM  Temp:  [98 F (36.7 C)-99.9 F (37.7 C)] 98.2 F (36.8 C) (05/21 1119) Pulse Rate:  [83-99] 98 (05/21 1119) Resp:  [14-20] 14 (05/21 1119) BP: (101-134)/(67-82) 134/76 (05/21 1119) SpO2:  [97 %-99 %] 99 % (05/21 1119) Weight:  [109.3 kg] 109.3 kg (05/21 0431)  General - morbid obesity, well developed, in no apparent distress.  Resp: No extra work of breathing.  Cardiovascular - Regular rhythm and rate. Abd: soft, round, NTTP, Non-distended  Ext: warm and well perfused  Neuro - Eyes open, awake alert, nonverbal, not following commands. Right gaze preference, but able to cross midline and left gaze incomplete.  Blinking to visual threat on the right but not on the left. Left facial droop. Left upper extremity 0/5 but significantly increased muscle tone, left LE slight withdraw to pain but also with  increased muscle tone. Hypohophonic vocalization with grunt which seemed to be attempt at speech.Right upper extremity spontaneous movement against gravity. Right lower extremity at least 3/5 spontaneously. Sensation, coordination and gait not able to be tested.  ASSESSMENT/PLAN Krista Maddox is a 35 y.o. female with history of multiple strokes who presented as a code stroke with symptoms suggestive of LVO. tPA was not given due to Eliquis. CTA head and neck revealed an occlusion of distal right M2 MCA with partial reconstitution and subsequent reocclusion of most M2 branches. Patient underwent thrombectomy.    Recurrent cryptogenic embolic strokes - Acute infarct of the R MCA due to right M1 occlusion s/p IR with TICI2c, cryptogenic etiology   Code Stroke: CT head No acute intracranial hemorrhage. Acute infarcts in the right MCA territory. ASPECTS 8.    CTA head & neck Occlusion of distal right M1 MCA with partial reconstitution and subsequent reocclusion of most M2 branches. Poor collaterals.  CT Perfusion imaging demonstrates core infarction 99 mL and penumbra of 34 mL. The extent of core infarction is calculated at greater than apparent loss of gray-white differentiation on noncontrast head CT. Therefore, penumbra may be greater.  2D Echo LVEF 60-65%, no structural abnormalities noted that could increase risk for CVA  Repeat CT  Head 4/24 - large left MCA infarct, no midline shift. Small SAH sylvian fissure, diminishing in size  Hypercoagulable panel: Homocysteine 15.1 due to smoking, otherwise negative to date  LDL 48  HgbA1c 5.7  VTE prophylaxis - SCDs + Lovenox  aspirin 325 mg daily and Eliquis (apixaban) daily prior to admission, now on ASA 325 and coumadin with INR goal 2.5-3.5.  INR 2.4->2.7->2.5->2.5->2.5->3.2->3.4->3.0  Therapy recommendations:  SNF  Disposition:  Pending  Hx of cryptogenic strokes  In 08/2017 patient admitted for left sided numbness and  headache.  MRI showed right cerebellum, right temporoparietal small infarcts.  EF 55 to 60%, CT head and neck unremarkable.  LDL 89, A1c 4.8.  TCD bubble study showed trivial PFO.  DVT negative.  TEE no PFO.  Hypercoagulable and autoimmune work-up negative.  Discharged with aspirin and Lipitor.  01/2018 episode of left facial numbness and left leg weakness.  CT head and neck unremarkable.  MRI negative.  06/2018 admitted to [facility] for right hand/face numbness.  MRI showed right tiny parietal infarct.  10/2020 admitted to [facility] for aphasia status post tPA.  CTA neck showed left M2 occlusion.  MRI showed left MCA and punctate right frontal infarct.  TCD bubble study potential trivial PFO, TEE tiny PFO.  11/2020 she was referred to loop recorder which was not done as physician thought she should be candidate for anticoagulation.  Started on Eliquis 5 mg twice daily.  Skin rash with blisters  Left flank skin lesion with blisters   Was concerning for shingles  Varicella zoster PCR negative, spoke with IC RN and precautions discontinued 5/6.   Off contact and airborne isolation  Valtrex discontinued  WOC on board for dressing    Hyperlipidemia  Home meds:  atrovastatin 80mg  and zetia 10mg    Will restart post CT if stable  LDL 48, goal < 70  Resumed lipitor 80 and zetia 10  Continue statin at discharge   Dysphagia- DYS diet 1  Cortrack placement for TF  SLP and RD following  Zero intake x 3 day calorie count on nocturnal feedings alone.   S/p PEG    On TF  No BM since 5/17, will give dulcolax suppository today   Birth control  Recommend OP PCP or OBGYN discontinue Depo - provera due to numerous Contraindications including hx of tobacco use, migraines, and multiple strokes.    Tobacco abuse  Current smoker  Smoking cessation counseling will be provided  Contraindicated for birth control Depo   Hx of pseudotumor cerebri Chronic migraine  Follows with Dr. 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   Other Stroke Risk Factors  Morbid Obesity, Body mass index is 42.12 kg/m., BMI >/= 30 associated with increased stroke risk, recommend weight loss, diet and exercise as appropriate   Obstructive sleep apnea   Hypokalemia K 3.2 -> supplement  Hospital day # 30   I have personally obtained history,examined this patient, reviewed notes, independently viewed imaging studies, participated in medical decision making and plan of care.ROS completed by me personally and pertinent positives fully documented  I have made any additions or clarifications directly to the above note. Agree with note above.  Continue ongoing medical management.  Patient is difficult to place in skilled nursing facility.  Greater than 50% time during this 25-minute visit was spent on counseling and coordination of care and discussion with care team.  [facility], MD  Medical Director [facility] Pager: [phone] 05/02/2021 3:33 PM

## 2021-05-02 NOTE — Progress Notes (Signed)
ANTICOAGULATION CONSULT NOTE  Pharmacy Consult for warfarin Indication:  Acute stroke  Allergies  Allergen Reactions  . Tape Rash    Cannot tolerate paper tape    Patient Measurements: Height: 5\' 5"  (165.1 cm) Weight: 109.3 kg (240 lb 15.4 oz) IBW/kg (Calculated) : 57  Heparin dosing weight: 72 kg    Vital Signs: Temp: 99.9 F (37.7 C) (05/21 0719) Temp Source: Axillary (05/21 0719) BP: 120/70 (05/21 0719) Pulse Rate: 86 (05/21 0719)  Labs: Recent Labs    04/30/21 0036 05/01/21 0511 05/02/21 0336  HGB  --  11.5*  --   HCT  --  36.6  --   PLT  --  251  --   LABPROT 34.4* 31.6* 31.2*  INR 3.4* 3.1* 3.0*  CREATININE  --  0.69  --     Estimated Creatinine Clearance: 121.9 mL/min (by C-G formula based on SCr of 0.69 mg/dL).  Assessment: 35 y.o female on apixaban PTA for hx prior strokes, ?DVT. Last dose PTA " unknown," admitted 04/02/21 with acute CVA. No TPA due to apixaban pta.   -4/21 s/p thrombectomy. -Neurologist consulted pharmacist on 04/08/21 to start warfarin with goal INR 2.5-3.5 per Dr. 04/10/21.  INR today remains therapeutic at 3 (goal of 2.5-3.5). CBC stable from 5/20 labs. No bleeding noted.   Goal of Therapy:  INR 2.5-3.5 per Neurologist  Monitor platelets by anticoagulation protocol: Yes   Plan:  - Warfarin 10 mg x 1 dose at 1600 today - Monitor daily INR, CBC, s/sx bleeding  Thank you for allowing pharmacy to be a part of this patient's care.  6/20, PharmD, BCPS Clinical Pharmacist Clinical phone for 05/02/2021: [phone] 05/02/2021 1:33 PM   **Pharmacist phone directory can now be found on [email or web address]).  Listed under [facility].

## 2021-05-03 DIAGNOSIS — I63511 Cerebral infarction due to unspecified occlusion or stenosis of right middle cerebral artery: Secondary | ICD-10-CM | POA: Diagnosis not present

## 2021-05-03 LAB — GLUCOSE, CAPILLARY
Glucose-Capillary: 99 mg/dL (ref 70–99)
Glucose-Capillary: 106 mg/dL — ABNORMAL HIGH (ref 70–99)
Glucose-Capillary: 110 mg/dL — ABNORMAL HIGH (ref 70–99)
Glucose-Capillary: 109 mg/dL — ABNORMAL HIGH (ref 70–99)
Glucose-Capillary: 109 mg/dL — ABNORMAL HIGH (ref 70–99)

## 2021-05-03 LAB — PROTIME-INR
INR: 3.2 — ABNORMAL HIGH (ref 0.8–1.2)
Prothrombin Time: 33 seconds — ABNORMAL HIGH (ref 11.4–15.2)

## 2021-05-03 MED ORDER — WARFARIN SODIUM 7.5 MG PO TABS: 7.5000 mg | ORAL_TABLET | Freq: Once | ORAL | Status: DC

## 2021-05-03 MED ORDER — WARFARIN SODIUM 7.5 MG PO TABS
7.5000 mg | ORAL_TABLET | Freq: Once | ORAL | Status: AC
  Administered 2021-05-03: 7.5 mg
  Filled 2021-05-03: qty 1

## 2021-05-03 NOTE — Progress Notes (Signed)
ANTICOAGULATION CONSULT NOTE  Pharmacy Consult for warfarin Indication:  Acute stroke  Allergies  Allergen Reactions  . Tape Rash    Cannot tolerate paper tape    Patient Measurements: Height: 5\' 5"  (165.1 cm) Weight: 109.3 kg (240 lb 15.4 oz) IBW/kg (Calculated) : 57  Heparin dosing weight: 72 kg    Vital Signs: Temp: 99.7 F (37.6 C) (05/22 0719) Temp Source: Axillary (05/22 0719) BP: 113/90 (05/22 0719) Pulse Rate: 100 (05/22 0719)  Labs: Recent Labs    05/01/21 0511 05/02/21 0336 05/03/21 0255  HGB 11.5*  --   --   HCT 36.6  --   --   PLT 251  --   --   LABPROT 31.6* 31.2* 33.0*  INR 3.1* 3.0* 3.2*  CREATININE 0.69  --   --     Estimated Creatinine Clearance: 121.9 mL/min (by C-G formula based on SCr of 0.69 mg/dL).  Assessment: 35 y.o female on apixaban PTA for hx prior strokes, ?DVT. Last dose PTA " unknown," admitted 04/02/21 with acute CVA. No TPA due to apixaban pta.   -4/21 s/p thrombectomy. -Neurologist consulted pharmacist on 04/08/21 to start warfarin with goal INR 2.5-3.5 per Dr. 04/10/21.  INR today remains therapeutic at 3.2 (goal of 2.5-3.5). CBC stable from 5/20 labs. No bleeding noted.   Goal of Therapy:  INR 2.5-3.5 per Neurologist  Monitor platelets by anticoagulation protocol: Yes   Plan:  - Warfarin 7.5 mg x 1 dose at 1600 today - Monitor daily INR, CBC, s/sx bleeding  Thank you for allowing pharmacy to be a part of this patient's care.  6/20, PharmD, BCPS Clinical Pharmacist Clinical phone for 05/03/2021: 05/05/2021 05/03/2021 11:17 AM   **Pharmacist phone directory can now be found on [email or web address] ([email or web address]).  Listed under [facility].

## 2021-05-03 NOTE — Progress Notes (Signed)
STROKE TEAM PROGRESS NOTE   INTERVAL HISTORY INR 3.2 today. No acute events.  VSS. No change in neuro exam. Smiles at examiners. Eliab RN at bedside reports patient cleaned after BM recently.  Vitals:   05/02/21 2320 05/03/21 0500 05/03/21 0719 05/03/21 1215  BP: 112/65  113/90 112/79  Pulse:   100 100  Resp: 17  20 20   Temp: 97.9 F (36.6 C)  99.7 F (37.6 C) 99.8 F (37.7 C)  TempSrc: Axillary  Axillary Axillary  SpO2:   99% 99%  Weight:  109.3 kg    Height:       CBC:  Recent Labs  Lab 04/29/21 0451 05/01/21 0511  WBC 10.2 8.8  HGB 11.8* 11.5*  HCT 38.8 36.6  MCV 92.8 91.3  PLT 303 251   Basic Metabolic Panel:  Recent Labs  Lab 04/29/21 0451 05/01/21 0511  NA 137 137  K 3.2* 3.8  CL 103 107  CO2 26 24  GLUCOSE 98 105*  BUN 25* 21*  CREATININE 0.71 0.69  CALCIUM 9.4 9.1  MG  --  2.0   IMAGING past 24 hours No results found.  PHYSICAL EXAM  Temp:  [97.9 F (36.6 C)-99.8 F (37.7 C)] 99.8 F (37.7 C) (05/22 1215) Pulse Rate:  [93-100] 100 (05/22 1215) Resp:  [17-21] 20 (05/22 1215) BP: (112-117)/(65-90) 112/79 (05/22 1215) SpO2:  [97 %-99 %] 99 % (05/22 1215) Weight:  [109.3 kg] 109.3 kg (05/22 0500)  General - morbid obesity, well developed, in no apparent distress.  Resp: No extra work of breathing.  Cardiovascular - Regular rhythm and rate. Abd: soft, round, NTTP, Non-distended  Ext: warm and well perfused  Neuro - Eyes open, awake alert, nonverbal, not following commands. Right gaze preference, but able to cross midline and left gaze incomplete.  Blinking to visual threat on the right but not on the left. Left facial droop. Left upper extremity 0/5 but significantly increased muscle tone, left LE slight withdraw to pain but also with increased muscle tone. Hypohophonic vocalization with grunt which seemed to be attempt at speech.Right upper extremity spontaneous movement against gravity. Right lower extremity at least 3/5 spontaneously.  Sensation, coordination and gait not able to be tested.  ASSESSMENT/PLAN [name] is a 35 y.o. female with history of multiple strokes who presented as a code stroke with symptoms suggestive of LVO. tPA was not given due to Eliquis. CTA head and neck revealed an occlusion of distal right M2 MCA with partial reconstitution and subsequent reocclusion of most M2 branches. Patient underwent thrombectomy.    Recurrent cryptogenic embolic strokes - Acute infarct of the R MCA due to right M1 occlusion s/p IR with TICI2c, cryptogenic etiology   Code Stroke: CT head No acute intracranial hemorrhage. Acute infarcts in the right MCA territory. ASPECTS 8.    CTA head & neck Occlusion of distal right M1 MCA with partial reconstitution and subsequent reocclusion of most M2 branches. Poor collaterals.  CT Perfusion imaging demonstrates core infarction 99 mL and penumbra of 34 mL. The extent of core infarction is calculated at greater than apparent loss of gray-white differentiation on noncontrast head CT. Therefore, penumbra may be greater.  2D Echo LVEF 60-65%, no structural abnormalities noted that could increase risk for CVA  Repeat CT Head 4/24 - large left MCA infarct, no midline shift. Small SAH sylvian fissure, diminishing in size  Hypercoagulable panel: Homocysteine 15.1 due to smoking, otherwise negative to date  LDL 48  HgbA1c 5.7  VTE prophylaxis - SCDs + Lovenox  aspirin 325 mg daily and Eliquis (apixaban) daily prior to admission, now on ASA 325 and coumadin with INR goal 2.5-3.5.  INR 2.4->2.7->2.5->2.5->2.5->3.2->3.4->3.0  Therapy recommendations:  SNF  Disposition:  Pending  Hx of cryptogenic strokes  In 08/2017 patient admitted for left sided numbness and headache.  MRI showed right cerebellum, right temporoparietal small infarcts.  EF 55 to 60%, CT head and neck unremarkable.  LDL 89, A1c 4.8.  TCD bubble study showed trivial PFO.  DVT negative.  TEE no PFO.   Hypercoagulable and autoimmune work-up negative.  Discharged with aspirin and Lipitor.  01/2018 episode of left facial numbness and left leg weakness.  CT head and neck unremarkable.  MRI negative.  06/2018 admitted to [facility] for right hand/face numbness.  MRI showed right tiny parietal infarct.  10/2020 admitted to [facility] for aphasia status post tPA.  CTA neck showed left M2 occlusion.  MRI showed left MCA and punctate right frontal infarct.  TCD bubble study potential trivial PFO, TEE tiny PFO.  11/2020 she was referred to loop recorder which was not done as physician thought she should be candidate for anticoagulation.  Started on Eliquis 5 mg twice daily.  Skin rash with blisters  Left flank skin lesion with blisters   Was concerning for shingles  Varicella zoster PCR negative, spoke with IC RN and precautions discontinued 5/6.   Off contact and airborne isolation  Valtrex discontinued  Resolved    Hyperlipidemia  Home meds:  atrovastatin 80mg  and zetia 10mg    Will restart post CT if stable  LDL 48, goal < 70  Resumed lipitor 80 and zetia 10  Continue statin at discharge   Dysphagia- DYS diet 1  Cortrack placement for TF  SLP and RD following  Zero intake x 3 day calorie count on nocturnal feedings alone.   S/p PEG    On TF  No BM since 5/17, will give dulcolax suppository today   Birth control  Recommend OP PCP or OBGYN discontinue Depo - provera due to numerous Contraindications including hx of tobacco use, migraines, and multiple strokes.    Tobacco abuse  Current smoker  Smoking cessation counseling will be provided  Contraindicated for birth control Depo   Hx of pseudotumor cerebri Chronic migraine  Follows with Dr. 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   Other Stroke Risk Factors  Morbid Obesity, Body mass index is 42.12 kg/m., BMI >/= 30  associated with increased stroke risk, recommend weight loss, diet and exercise as appropriate   Obstructive sleep apnea   Hypokalemia K 3.2 -> supplement  Hospital day # 31   I have personally obtained history,examined this patient, reviewed notes, independently viewed imaging studies, participated in medical decision making and plan of care.ROS completed by me personally and pertinent positives fully documented  I have made any additions or clarifications directly to the above note. Agree with note above.   , MD Medical Director [facility] Pager: [phone] 05/03/2021 3:35 PM  05/03/2021 1:52 PM

## 2021-05-04 DIAGNOSIS — I63511 Cerebral infarction due to unspecified occlusion or stenosis of right middle cerebral artery: Secondary | ICD-10-CM | POA: Diagnosis not present

## 2021-05-04 LAB — CBC
HCT: 36.1 % (ref 36.0–46.0)
Hemoglobin: 11.2 g/dL — ABNORMAL LOW (ref 12.0–15.0)
MCH: 28.4 pg (ref 26.0–34.0)
MCHC: 31 g/dL (ref 30.0–36.0)
MCV: 91.4 fL (ref 80.0–100.0)
Platelets: 245 10*3/uL (ref 150–400)
RBC: 3.95 MIL/uL (ref 3.87–5.11)
RDW: 15.4 % (ref 11.5–15.5)
WBC: 8.1 10*3/uL (ref 4.0–10.5)
nRBC: 0 % (ref 0.0–0.2)

## 2021-05-04 LAB — GLUCOSE, CAPILLARY
Glucose-Capillary: 106 mg/dL — ABNORMAL HIGH (ref 70–99)
Glucose-Capillary: 107 mg/dL — ABNORMAL HIGH (ref 70–99)
Glucose-Capillary: 116 mg/dL — ABNORMAL HIGH (ref 70–99)
Glucose-Capillary: 89 mg/dL (ref 70–99)
Glucose-Capillary: 93 mg/dL (ref 70–99)
Glucose-Capillary: 99 mg/dL (ref 70–99)

## 2021-05-04 LAB — BASIC METABOLIC PANEL
Anion gap: 8 (ref 5–15)
BUN: 16 mg/dL (ref 6–20)
CO2: 23 mmol/L (ref 22–32)
Calcium: 8.8 mg/dL — ABNORMAL LOW (ref 8.9–10.3)
Chloride: 107 mmol/L (ref 98–111)
Creatinine, Ser: 0.64 mg/dL (ref 0.44–1.00)
GFR, Estimated: >60 mL/min (ref >60)
Glucose, Bld: 105 mg/dL — ABNORMAL HIGH (ref 70–99)
Potassium: 3.5 mmol/L (ref 3.5–5.1)
Sodium: 138 mmol/L (ref 135–145)

## 2021-05-04 LAB — PROTIME-INR
INR: 3.5 — ABNORMAL HIGH (ref 0.8–1.2)
Prothrombin Time: 35 seconds — ABNORMAL HIGH (ref 11.4–15.2)

## 2021-05-04 MED ORDER — WARFARIN SODIUM 5 MG PO TABS
5.0000 mg | ORAL_TABLET | Freq: Once | ORAL | Status: AC
  Administered 2021-05-04: 5 mg
  Filled 2021-05-04: qty 1

## 2021-05-04 NOTE — Progress Notes (Signed)
STROKE TEAM PROGRESS NOTE   INTERVAL HISTORY INR 3.5 today. No acute events.  VSS. No change in neuro exam. Lying in bed in NAD. Awaiting SNF placement in difficult to place scenario.  CM contacted for update on discharge planning. [facility] inpatient rehab has been contacted and is assessing patient.  Vitals:   05/04/21 0358 05/04/21 0400 05/04/21 0732 05/04/21 0900  BP:   117/75   Pulse:   90   Resp:  20 15   Temp:  98.6 F (37 C) 98.3 F (36.8 C)   TempSrc:  Oral  Oral  SpO2:   98%   Weight: 110.2 kg     Height:       CBC:  Recent Labs  Lab 05/01/21 0511 05/04/21 0629  WBC 8.8 8.1  HGB 11.5* 11.2*  HCT 36.6 36.1  MCV 91.3 91.4  PLT 251 245   Basic Metabolic Panel:  Recent Labs  Lab 05/01/21 0511 05/04/21 0629  NA 137 138  K 3.8 3.5  CL 107 107  CO2 24 23  GLUCOSE 105* 105*  BUN 21* 16  CREATININE 0.69 0.64  CALCIUM 9.1 8.8*  MG 2.0  --    IMAGING past 24 hours No results found.  PHYSICAL EXAM  Temp:  [98.3 F (36.8 C)-99.4 F (37.4 C)] 98.3 F (36.8 C) (05/23 0732) Pulse Rate:  [90-105] 90 (05/23 0732) Resp:  [15-20] 15 (05/23 0732) BP: (108-127)/(68-81) 117/75 (05/23 0732) SpO2:  [98 %] 98 % (05/23 0732) Weight:  [110.2 kg] 110.2 kg (05/23 0358)  General - morbid obesity, well developed, in no apparent distress.  Resp: No extra work of breathing.  Cardiovascular - Regular rhythm and rate. Abd: soft, round, NTTP, Non-distended  Ext: warm and well perfused  Neuro - Eyes open, awake alert, nonverbal, not following commands. Right gaze preference, but able to cross midline and left gaze incomplete.  Blinking to visual threat on the right but not on the left. Left facial droop. Left upper extremity 0/5 but significantly increased muscle tone, left LE slight withdraw to pain but also with increased muscle tone. Hypohophonic vocalization with grunt which seemed to be attempt at speech.Right upper extremity spontaneous movement against gravity.  Right lower extremity at least 3/5 spontaneously. Sensation, coordination and gait not able to be tested.  ASSESSMENT/PLAN [name] is a 35 y.o. female with history of multiple strokes who presented as a code stroke with symptoms suggestive of LVO. tPA was not given due to Eliquis. CTA head and neck revealed an occlusion of distal right M2 MCA with partial reconstitution and subsequent reocclusion of most M2 branches. Patient underwent thrombectomy.    Recurrent cryptogenic embolic strokes - Acute infarct of the R MCA due to right M1 occlusion s/p IR with TICI2c, cryptogenic etiology   Code Stroke: CT head No acute intracranial hemorrhage. Acute infarcts in the right MCA territory. ASPECTS 8.    CTA head & neck Occlusion of distal right M1 MCA with partial reconstitution and subsequent reocclusion of most M2 branches. Poor collaterals.  CT Perfusion imaging demonstrates core infarction 99 mL and penumbra of 34 mL. The extent of core infarction is calculated at greater than apparent loss of gray-white differentiation on noncontrast head CT. Therefore, penumbra may be greater.  2D Echo LVEF 60-65%, no structural abnormalities noted that could increase risk for CVA  Repeat CT Head 4/24 - large left MCA infarct, no midline shift. Small SAH sylvian fissure, diminishing in size  Hypercoagulable panel:  Homocysteine 15.1 due to smoking, otherwise negative to date  LDL 48  HgbA1c 5.7  VTE prophylaxis - SCDs + Lovenox  aspirin 325 mg daily and Eliquis (apixaban) daily prior to admission, now on ASA 325 and coumadin (pharmacy manged) with INR goal 2.5-3.5.  INR 2.4->2.7->2.5->2.5->2.5->3.2->3.4->3.0->3.5.   Therapy recommendations:  SNF  Disposition:  Pending  Hx of cryptogenic strokes  In 08/2017 patient admitted for left sided numbness and headache.  MRI showed right cerebellum, right temporoparietal small infarcts.  EF 55 to 60%, CT head and neck unremarkable.  LDL 89, A1c  4.8.  TCD bubble study showed trivial PFO.  DVT negative.  TEE no PFO.  Hypercoagulable and autoimmune work-up negative.  Discharged with aspirin and Lipitor.  01/2018 episode of left facial numbness and left leg weakness.  CT head and neck unremarkable.  MRI negative.  06/2018 admitted to [facility] for right hand/face numbness.  MRI showed right tiny parietal infarct.  10/2020 admitted to [facility] for aphasia status post tPA.  CTA neck showed left M2 occlusion.  MRI showed left MCA and punctate right frontal infarct.  TCD bubble study potential trivial PFO, TEE tiny PFO.  11/2020 she was referred to loop recorder which was not done as physician thought she should be candidate for anticoagulation.  Started on Eliquis 5 mg twice daily.  Skin rash with blisters  Left flank skin lesion with blisters   Was concerning for shingles  Varicella zoster PCR negative, spoke with IC RN and precautions discontinued 5/6.   Off contact and airborne isolation  Valtrex discontinued  Resolved    Hyperlipidemia  Home meds:  atrovastatin 80mg  and zetia 10mg    Will restart post CT if stable  LDL 48, goal < 70  Resumed lipitor 80 and zetia 10  Continue statin at discharge   Dysphagia- DYS diet 1  Cortrack placement for TF  SLP and RD following  Zero intake x 3 day calorie count on nocturnal feedings alone.   S/p PEG    On TF  No BM since 5/17, will give dulcolax suppository today   Birth control  Recommend OP PCP or OBGYN discontinue Depo - provera due to numerous Contraindications including hx of tobacco use, migraines, and multiple strokes.    Tobacco abuse  Current smoker  Smoking cessation counseling will be provided  Contraindicated for birth control Depo   Hx of pseudotumor cerebri Chronic migraine  Follows with Dr. at 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   Other Stroke Risk  Factors  Morbid Obesity, Body mass index is 42.12 kg/m., BMI >/= 30 associated with increased stroke risk, recommend weight loss, diet and exercise as appropriate   Obstructive sleep apnea   Hypokalemia K 3.2 -> supplement  Hospital day # 32  I have personally obtained history,examined this patient, reviewed notes, independently viewed imaging studies, participated in medical decision making and plan of care.ROS completed by me personally and pertinent positives fully documented  I have made any additions or clarifications directly to the above note. Agree with note above.  Social worker continue to try to find skilled nursing facility for patient placement though it is difficult due to her young age  , MD Medical Director 11/2017 Stroke Center Pager: [phone] 05/04/2021 3:08 PM   05/04/2021 2:22 PM   To contact Stroke Continuity provider, please refer to [phone]. After hours, contact General Neurology

## 2021-05-04 NOTE — TOC Progression Note (Signed)
Transition of Care [facility]) - Progression Note    Patient Details  Name: Krista Maddox MRN: [id number] Date of Birth: Aug 21, 1986  Transition of Care [facility]) CM/SW Contact  Erin Sons, [place] Phone Number: 05/04/2021, 11:16 AM  Clinical Narrative:     CSW called Tammy at [facility]([place]). She explained that pt would need to be following commands more in order to be considered for admission. Pt will see PT again today. TOC will continue to follow.   Expected Discharge Plan: Skilled Nursing Facility Barriers to Discharge: Continued Medical Work up,Insurance Authorization  Expected Discharge Plan and Services Expected Discharge Plan: Skilled Nursing Facility       Living arrangements for the past 2 months: Single Family Home                                       Social Determinants of Health (SDOH) Interventions    Readmission Risk Interventions No flowsheet data found.

## 2021-05-04 NOTE — Progress Notes (Signed)
ANTICOAGULATION CONSULT NOTE  Pharmacy Consult for warfarin Indication:  Acute stroke  Allergies  Allergen Reactions  . Tape Rash    Cannot tolerate paper tape    Patient Measurements: Height: 5\' 5"  (165.1 cm) Weight: 110.2 kg (242 lb 15.2 oz) IBW/kg (Calculated) : 57  Heparin dosing weight: 72 kg    Vital Signs: Temp: 98.3 F (36.8 C) (05/23 0732) Temp Source: Oral (05/23 0900) BP: 117/75 (05/23 0732) Pulse Rate: 90 (05/23 0732)  Labs: Recent Labs    05/02/21 0336 05/03/21 0255 05/04/21 0629  HGB  --   --  11.2*  HCT  --   --  36.1  PLT  --   --  245  LABPROT 31.2* 33.0* 35.0*  INR 3.0* 3.2* 3.5*  CREATININE  --   --  0.64    Estimated Creatinine Clearance: 122.5 mL/min (by C-G formula based on SCr of 0.64 mg/dL).  Assessment: 35 y.o female on apixaban PTA for hx prior strokes, ?DVT. Last dose PTA " unknown," admitted 04/02/21 with acute CVA. No TPA due to apixaban pta.   -4/21 s/p thrombectomy. -Neurologist consulted pharmacist on 04/08/21 to start warfarin with goal INR 2.5-3.5 per Dr. 04/10/21.  INR today remains therapeutic at 3.5 (goal of 2.5-3.5). CBC stable. No bleeding noted.   Goal of Therapy:  INR 2.5-3.5 per Neurologist  Monitor platelets by anticoagulation protocol: Yes   Plan:  - Warfarin 5 mg x 1 dose at 1600 today - Monitor daily INR, CBC, s/sx bleeding  Thank you for allowing pharmacy to be a part of this patient's care.  Morrie Sheldon, PharmD, [facility] Clinical Pharmacist Please see AMION for all Pharmacists' Contact Phone Numbers 05/04/2021, 10:35 AM

## 2021-05-04 NOTE — Progress Notes (Signed)
Physical Therapy Treatment Patient Details Name: Krista Maddox MRN: [id number] DOB: [birth year] Today's Date: 05/04/2021    History of Present Illness The pt is a 35 yo female presenting with L sided facial droop and L sided weakness on 4/21. Underwent angiogram and revascularization on 4/21. CT head showing large right MCA territory infarct with 4 mm leftward midline shift. S/p PEG 5/5. 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    PT Comments    The pt presents with improvements in alertness and engagement this session. She was able to indicate need to use the bathroom at start of session, and was able to follow commands to complete reaching and head turning in bed to facilitate rolling with modA. The pt was then able to complete other bed mobility with modA of 2, initially reaching for bed rail to R resulting in significant R lean and LOB. Did follow cues and commands well to reduce reliance on RUE for balance, and was able to follow commands to complete head turns in both directions while seated. The pt did indicate desire to attempt standing, but was unable to initiate hip lift to complete stand despite maxA of 2, use of HHA, or use of stedy at this time. The pt remains appropriate for CIR level therapies following d/c.    Follow Up Recommendations  SNF;Supervision/Assistance - 24 hour     Equipment Recommendations  None recommended by PT    Recommendations for Other Services       Precautions / Restrictions Precautions Precautions: Fall Precaution Comments: abdominal binder; peg Required Braces or Orthoses: Other Brace Other Brace: L PRAFO Restrictions Weight Bearing Restrictions: No    Mobility  Bed Mobility Overal bed mobility: Needs Assistance Bed Mobility: Rolling Rolling: Mod assist;+2 for physical assistance Sidelying to sit: Max assist;+2 for physical assistance Supine to sit: Mod assist;+2 for physical  assistance     General bed mobility comments: pt able to reach for bed rail with RUE, grasp, and assist with pulling to complete roll to L. modA to assist with BLE. maxA to complete transition to sitting, elevation of trunk from hob    Transfers Overall transfer level: Needs assistance Equipment used: 2 person hand held assist;Ambulation equipment used Transfers: Sit to/from Stand Sit to Stand: Total assist;+2 physical assistance;+2 safety/equipment         General transfer comment: no successful attempts at this time. trialed with BUE HHA and with stedy. pt nodding yes when asked if she wants to stand, initiating rocking movement to attempt standing, but not able to initiate any powering up with RLE.  Ambulation/Gait             General Gait Details: unable    Modified Rankin (Stroke Patients Only) Modified Rankin (Stroke Patients Only) Pre-Morbid Rankin Score: Moderate disability Modified Rankin: Severe disability     Balance Overall balance assessment: Needs assistance Sitting-balance support: Feet supported;Single extremity supported Sitting balance-Leahy Scale: Fair Sitting balance - Comments: able to sit unsupported . Initially reaching for bedrail on R. Bedrail removed and pt able to maintain midline postural control. Postural control appear to be affected by attentional deficts as pt begins to demosntrate a L lateral lean. Pt will attempt to achieve midline with significant delay in processing. Postural control: Posterior lean;Left lateral lean Standing balance support: Bilateral upper extremity supported Standing balance-Leahy Scale: Zero Standing balance comment: Unable to stand with TAx2.  Cognition Arousal/Alertness: Awake/alert Behavior During Therapy: Flat affect Overall Cognitive Status: Impaired/Different from baseline Area of Impairment: Attention;Following commands;Awareness;Problem solving;Safety/judgement                    Current Attention Level: Sustained (for short bursts)   Following Commands: Follows one step commands inconsistently Safety/Judgement: Decreased awareness of safety;Decreased awareness of deficits Awareness: Intellectual Problem Solving: Slow processing;Decreased initiation;Difficulty sequencing;Requires verbal cues;Requires tactile cues General Comments: pt tearful and apologizing for deficits throughout session, but able to repeatedly follow commands regarding visual tracking or locating objects in her environment (even to L side). Pt following ~60% of commands with RUE such as "hold this" with pt appropriately reaching for object and holding it. The pt was then able to express desire to stand, and initiated rocking motion      Exercises Other Exercises Other Exercises: AROM cervical rotation x5 bilaterally    General Comments General comments (skin integrity, edema, etc.): VSS on RA      Pertinent Vitals/Pain Pain Assessment: Faces Faces Pain Scale: Hurts a little bit Pain Location: R lower back Pain Descriptors / Indicators: Discomfort;Grimacing;Guarding Pain Intervention(s): Monitored during session;Repositioned           PT Goals (current goals can now be found in the care plan section) Acute Rehab PT Goals Patient Stated Goal: pt nodded yes when asked if she would like to stand up PT Goal Formulation: Patient unable to participate in goal setting Time For Goal Achievement: 05/11/21 Potential to Achieve Goals: Fair Progress towards PT goals: Progressing toward goals    Frequency    Min 3X/week      PT Plan Current plan remains appropriate       AM-PAC PT "6 Clicks" Mobility   Outcome Measure  Help needed turning from your back to your side while in a flat bed without using bedrails?: Total Help needed moving from lying on your back to sitting on the side of a flat bed without using bedrails?: Total Help needed moving to and from a bed  to a chair (including a wheelchair)?: Total Help needed standing up from a chair using your arms (e.g., wheelchair or bedside chair)?: Total Help needed to walk in hospital room?: Total Help needed climbing 3-5 steps with a railing? : Total 6 Click Score: 6    End of Session Equipment Utilized During Treatment: Gait belt Activity Tolerance: Patient tolerated treatment well Patient left: with call bell/phone within reach;in chair;with chair alarm set;with SCD's reapplied (L PRAFO donned) Nurse Communication: Mobility status PT Visit Diagnosis: Hemiplegia and hemiparesis;Other abnormalities of gait and mobility (R26.89);Difficulty in walking, not elsewhere classified (R26.2);Other symptoms and signs involving the nervous system (R29.898);Muscle weakness (generalized) (M62.81) Hemiplegia - Right/Left: Left Hemiplegia - dominant/non-dominant: Non-dominant Hemiplegia - caused by: Cerebral infarction     Time: 1456-1531 PT Time Calculation (min) (ACUTE ONLY): 35 min  Charges:  $Therapeutic Activity: 8-22 mins $Neuromuscular Re-education: 8-22 mins                     Rolm Baptise, PT, DPT   Acute Rehabilitation Department Pager #: [phone]   [name] 05/04/2021, 5:01 PM

## 2021-05-04 NOTE — Progress Notes (Addendum)
Pt used call bell to call RN and told RN she needed to use the bathroom. RN and tech placed Pt on bed pan. Pt was tearful and apologetic. RN explained to Pt that she did the right thing by using th call bell and calling the nurse.

## 2021-05-05 DIAGNOSIS — I63511 Cerebral infarction due to unspecified occlusion or stenosis of right middle cerebral artery: Secondary | ICD-10-CM | POA: Diagnosis not present

## 2021-05-05 LAB — PROTIME-INR
INR: 3.1 — ABNORMAL HIGH (ref 0.8–1.2)
Prothrombin Time: 32.1 seconds — ABNORMAL HIGH (ref 11.4–15.2)

## 2021-05-05 LAB — GLUCOSE, CAPILLARY
Glucose-Capillary: 112 mg/dL — ABNORMAL HIGH (ref 70–99)
Glucose-Capillary: 109 mg/dL — ABNORMAL HIGH (ref 70–99)
Glucose-Capillary: 97 mg/dL (ref 70–99)
Glucose-Capillary: 96 mg/dL (ref 70–99)
Glucose-Capillary: 107 mg/dL — ABNORMAL HIGH (ref 70–99)
Glucose-Capillary: 102 mg/dL — ABNORMAL HIGH (ref 70–99)
Glucose-Capillary: 83 mg/dL (ref 70–99)

## 2021-05-05 MED ORDER — WARFARIN SODIUM 7.5 MG PO TABS
7.5000 mg | ORAL_TABLET | Freq: Once | ORAL | Status: AC
  Administered 2021-05-05: 7.5 mg
  Filled 2021-05-05: qty 1

## 2021-05-05 MED ORDER — POLYETHYLENE GLYCOL 3350 17 G PO PACK
17.0000 g | PACK | Freq: Every day | ORAL | Status: DC
  Administered 2021-05-06 – 2021-05-15 (×7): 17 g
  Filled 2021-05-05 (×9): qty 1

## 2021-05-05 NOTE — TOC Progression Note (Signed)
Transition of Care (TOC) - Progression Note  Donn Pierini RN,BSN Transitions of Care Unit 4NP (non trauma) - RN Case Manager See Treatment Team for direct Phone #   Patient Details  Name: Krista Maddox MRN: [id number] Date of Birth: 12/22/1985  Transition [facility] Care [facility]) CM/SW Contact  Zenda Alpers, Lenn Sink, RN Phone Number: 05/05/2021, 11:41 AM  Clinical Narrative:    Sherron Monday with Tammy at [facility] this am, she has not had a chance yet to review updated therapy notes. Will plan to review today and return call with update on status with [facility]. Tammy did come by for a bedside visit as well with patient yesterday.  Will await return call from [facility] on bed offer status.    Expected Discharge Plan: Skilled Nursing Facility Barriers to Discharge: Continued Medical Work up,Insurance Authorization  Expected Discharge Plan and Services Expected Discharge Plan: Skilled Nursing Facility       Living arrangements for the past 2 months: Single Family Home                                       Social Determinants of Health (SDOH) Interventions    Readmission Risk Interventions No flowsheet data found.

## 2021-05-05 NOTE — Progress Notes (Signed)
OT Note  Discussed with COTA. LUE moves in flexor synergy pattern involuntarily and pt beginning to develop tightness in flexor group. Recommend resting hand splint to improve positioning of L hand and decrease risk of contracture. Order placed. Will establish wearing schedule.   Luisa Dago, OT/L   Acute OT Clinical Specialist Acute Rehabilitation Services Pager [phone] Office [phone]

## 2021-05-05 NOTE — Progress Notes (Signed)
  Speech Language Pathology Treatment: Cognitive-Linquistic;Dysphagia  Patient Details Name: Krista Maddox MRN: [id number] DOB: 1985-12-28 Today's Date: 05/05/2021 Time: 7741-2878 SLP Time Calculation (min) (ACUTE ONLY): 20 min  Assessment / Plan / Recommendation Clinical Impression  Pt encountered alert sitting up and OT present wrapping up their session. 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 Increased vocalizations today and with OT she stated "am I allowed to stand?'. Pt whispered "puppy" when showed picture of SLP's dog and "blueberry" making a face with blueberry yogurt cup.  Krista Maddox accepted cup with straw to consume gingerale. If given additional time and allow pt to visualize and tactilly explore solids she will intermittently initiate taking a bite. She has not accepted bites of solids from this therapist but today she accepted a cheese stick with therapist assisted in breaking off a piece. She held it, moved it around with fingers and took a bite after several minutes and proceeded to take one more small piece. There is no concern with aspiration in any po observation with this therapist but declining intake. Therapist may trial her with a meal tray one day this week to determine agreeability and acceptance for various food/textures.    HPI HPI: Pt is a 35 y.o. female with a PMHx of prior strokes x 4 (right cerebellar and right posterior frontal infarctions in 2018 (while smoking and on oral contraceptives), as well as an acute occlusion of her left M2 on 11/09/2020 for which she was treated with thrombectomy at [name] with sequelae of aphasia and left sided numbness), chronic MHAs, chronic AC with Eliquis, HLD, ? IIH, DDD, ITP during pregnancy, remote tobacco use, and disorder of optic nerve. Patient presented as a code stroke due to right gaze preference, left sided weakness, aphasia and left facial droop.  MV:EHMCN infarcts in the right MCA  territory; OBS:[id number] of distal right M1 MCA with partial reconstitution and  subsequent reocclusion of most M2 branches. Poor collaterals. Pt s/p revascularization of occluded RT MCA. Cotrak placed 4/22      SLP Plan  Continue with current plan of care       Recommendations  Diet recommendations: NPO Liquids provided via: Straw Medication Administration: Via alternative means                Oral Care Recommendations: Oral care QID Follow up Recommendations: Skilled Nursing facility SLP Visit Diagnosis: Dysphagia, unspecified (R13.10);Aphasia (R47.01);Cognitive communication deficit (U76.546) Plan: Continue with current plan of care                       [name] 05/05/2021, 11:12 AM  [name] [name].Ed Nurse, children's [phone] Office [phone]

## 2021-05-05 NOTE — Progress Notes (Signed)
Occupational Therapy Treatment Patient Details Name: Krista Maddox MRN: [id number] DOB: 1986-01-12 Today's Date: 05/05/2021    History of present illness The pt is a 35 yo female presenting with L sided facial droop and L sided weakness on 4/21. Underwent angiogram and revascularization on 4/21. CT head showing large right MCA territory infarct with 4 mm leftward midline shift. S/p PEG 5/5. 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   OT comments  Pt with noted improvements this session noted to state more phrases such as "I'm sorry" " am I allowed to stand?" and "i'm cold." pt able to track to L with stimulus of "Little Mermaid" on cellphone and noted to smile and nod her head along to the music. Pt continues to require MAX - total A +2 for bed mobility but able to sit EOB with moments of no UE support with close supervision. Worked on lateral reaching with RUE with pt needing assist to full rotate head to L to locate techs hand to tap it and return to midline. Attempted sit<>stand x2 from EOB with total A +2, able to shift pts hips towards HOB during partial stand, however pt with no activation in BLEs when attempting to stand. Pt would continue to benefit from skilled occupational therapy while admitted and after d/c to address the below listed limitations in order to improve overall functional mobility and facilitate independence with BADL participation. DC plan remains appropriate, will follow acutely per POC.     Follow Up Recommendations  SNF;Supervision/Assistance - 24 hour    Equipment Recommendations  3 in 1 bedside commode    Recommendations for Other Services      Precautions / Restrictions Precautions Precautions: Fall Precaution Comments: abdominal binder; peg Required Braces or Orthoses: Other Brace Other Brace: L PRAFO Restrictions Weight Bearing Restrictions: No       Mobility Bed Mobility Overal bed mobility:  Needs Assistance Bed Mobility: Supine to Sit;Sit to Supine     Supine to sit: Max assist;+2 for physical assistance Sit to supine: Total assist;+2 for physical assistance   General bed mobility comments: pt reaching with RUE to OTA to asssit with elevating trunk into sitting but ulimately needed MAX A +2 to transition to EOB, total A +2 to return to supine    Transfers Overall transfer level: Needs assistance Equipment used: 2 person hand held assist Transfers: Sit to/from Stand Sit to Stand: +2 physical assistance;Total assist         General transfer comment: attempted sit<>stand x2 from EOB with total A +2, pt able to clear buttocks partly from EOB with heavy assist from OTA and tech to shift pts hips back and to the Left. no activation noted in RLE or LLE    Balance Overall balance assessment: Needs assistance Sitting-balance support: Feet supported;Single extremity supported Sitting balance-Leahy Scale: Fair Sitting balance - Comments: sitting EOB during session with close supervision with moments of no UE support and moments of RUE supported on end of bed.     Standing balance-Leahy Scale: Zero Standing balance comment: Unable to stand with TAx2.                           ADL either performed or assessed with clinical judgement   ADL Overall ADL's : Needs assistance/impaired     Grooming: Brushing hair;Total assistance;Sitting Grooming Details (indicate cue type and reason): total A for thoroughness to bursh  but did initiate motion of brining brush to hair             Lower Body Dressing: Total assistance;Bed level Lower Body Dressing Details (indicate cue type and reason): to don socks and PRAFO Toilet Transfer: +2 for physical assistance;+2 for safety/equipment;Total assistance Toilet Transfer Details (indicate cue type and reason): unable to stand despite total A +2         Functional mobility during ADLs: Maximal assistance;+2 for physical  assistance (attempted sit<>stand) General ADL Comments: pt noted to scan fully to L with stimulus of little mermaid on phone, would sustain for up to 3 mins at a time. noted to state phrases such as "I'm sorry" "am I allowed to stand?" and "I'm cold" pt able to initiate bringing hair brush to hair     Vision   Additional Comments: R gaze preference but able to track to L with stimulus of little mermaid on phone and sustain L gaze ~ 3 mins at a time   Perception     Praxis      Cognition Arousal/Alertness: Awake/alert Behavior During Therapy: Flat affect Overall Cognitive Status: Impaired/Different from baseline Area of Impairment: Attention;Following commands;Awareness;Problem solving;Safety/judgement                   Current Attention Level: Sustained (for short bouts ~ 3 mins at a time)   Following Commands: Follows one step commands inconsistently Safety/Judgement: Decreased awareness of safety;Decreased awareness of deficits Awareness: Intellectual Problem Solving: Slow processing;Decreased initiation;Difficulty sequencing;Requires verbal cues;Requires tactile cues General Comments: pt noted to state phrases such as "im sorry" "am I allowed to stand?" and "i'm cold" pt with moments of sustained attention to Little mermaid movie on phone, able to recognize object of hair brush and able to initiate bringing it to her hair.        Exercises General Exercises - Upper Extremity Elbow Flexion: PROM;Left;10 reps;Seated Elbow Extension: Left;PROM;Seated;10 reps Wrist Flexion: PROM;Left;5 reps;Seated Wrist Extension: Left;PROM;5 reps;Supine Other Exercises Other Exercises: AAROM cervical rotation x5 bilaterally   Shoulder Instructions       General Comments pt on RA with HR max 121 bpm    Pertinent Vitals/ Pain       Pain Assessment: Faces Faces Pain Scale: Hurts a little bit Pain Location: generalized with mobility when returning back to supine Pain Descriptors /  Indicators: Discomfort;Grimacing;Guarding Pain Intervention(s): Monitored during session;Repositioned  Home Living                                          Prior Functioning/Environment              Frequency  Min 2X/week        Progress Toward Goals  OT Goals(current goals can now be found in the care plan section)  Progress towards OT goals: Progressing toward goals  Acute Rehab OT Goals Patient Stated Goal: to stand up OT Goal Formulation: With patient Time For Goal Achievement: 05/15/21 Potential to Achieve Goals: Fair  Plan Discharge plan remains appropriate;Frequency remains appropriate    Co-evaluation                 AM-PAC OT "6 Clicks" Daily Activity     Outcome Measure   Help from another person eating meals?: Total Help from another person taking care of personal grooming?: A Lot Help from another person toileting, which includes  using toliet, bedpan, or urinal?: Total Help from another person bathing (including washing, rinsing, drying)?: Total Help from another person to put on and taking off regular upper body clothing?: Total Help from another person to put on and taking off regular lower body clothing?: Total 6 Click Score: 7    End of Session Equipment Utilized During Treatment: Gait belt  OT Visit Diagnosis: Unsteadiness on feet (R26.81);Other abnormalities of gait and mobility (R26.89);Muscle weakness (generalized) (M62.81);Low vision, both eyes (H54.2);Apraxia (R48.2);Other symptoms and signs involving cognitive function;Hemiplegia and hemiparesis Symptoms and signs involving cognitive functions: Cerebral infarction Hemiplegia - Right/Left: Left Hemiplegia - dominant/non-dominant: Non-Dominant Hemiplegia - caused by: Cerebral infarction   Activity Tolerance Patient tolerated treatment well   Patient Left in bed;with call bell/phone within reach;Other (comment) (SLP entering to work with pt)   Nurse Communication  Mobility status        Time: 0935-1000 OT Time Calculation (min): 25 min  Charges: OT General Charges $OT Visit: 1 Visit OT Treatments $Self Care/Home Management : 23-37 mins  Lenor Derrick., COTA/L Acute Rehabilitation Services [phone]    Barron Schmid 05/05/2021, 10:23 AM

## 2021-05-05 NOTE — Progress Notes (Signed)
Orthopedic Tech Progress Note Patient Details:  Krista Maddox 06-Mar-1986 [id number] Called in order to [facility] for a RESTING HAND SPLINT  Patient ID: Krista Maddox, female   DOB: 09-21-1986, 35 y.o.   MRN: [id number]   [name] 05/05/2021, 3:57 PM

## 2021-05-05 NOTE — Progress Notes (Signed)
STROKE TEAM PROGRESS NOTE   INTERVAL HISTORY INR 3.1 today. No acute events.  VSS. No change in neuro exam. Lying in bed in NAD.  Awaiting placement [facility] inpatient rehab has been contacted and will assess patient. Neurological exam is unchanged.  Vital signs are stable. OT noted patient beginning to develop tightness in flexor group.  Recommended resting hand split to improve positioning and decrease risk of contracture.  Ortho contacted.  Vitals:   05/05/21 0400 05/05/21 0430 05/05/21 0449 05/05/21 0836  BP: 111/75  111/75 125/71  Pulse:   92   Resp:   20   Temp: 98.6 F (37 C)  98.7 F (37.1 C) 98.1 F (36.7 C)  TempSrc: Oral  Axillary Oral  SpO2:   97%   Weight:  110.2 kg    Height:       CBC:  Recent Labs  Lab 05/01/21 0511 05/04/21 0629  WBC 8.8 8.1  HGB 11.5* 11.2*  HCT 36.6 36.1  MCV 91.3 91.4  PLT 251 245   Basic Metabolic Panel:  Recent Labs  Lab 05/01/21 0511 05/04/21 0629  NA 137 138  K 3.8 3.5  CL 107 107  CO2 24 23  GLUCOSE 105* 105*  BUN 21* 16  CREATININE 0.69 0.64  CALCIUM 9.1 8.8*  MG 2.0  --    IMAGING past 24 hours No results found.  PHYSICAL EXAM  Temp:  [98 F (36.7 C)-99.8 F (37.7 C)] 98.1 F (36.7 C) (05/24 0836) Pulse Rate:  [92-107] 92 (05/24 0449) Resp:  [19-20] 20 (05/24 0449) BP: (103-129)/(70-83) 125/71 (05/24 0836) SpO2:  [97 %-98 %] 97 % (05/24 0449) Weight:  [110.2 kg] 110.2 kg (05/24 0430)  General - morbid obesity, well developed, in no apparent distress.  Resp: No extra work of breathing.  Cardiovascular - Regular rhythm and rate. Abd: soft, round, NTTP, Non-distended  Ext: warm and well perfused  Neuro - Eyes open, awake alert, nonverbal, not following commands. Right gaze preference, but able to cross midline and left gaze incomplete.  Blinking to visual threat bilaterally. Left facial droop. Left upper extremity 0/5 but significantly increased muscle tone, left LE slight withdraw to pain but also  with increased muscle tone. Hypohophonic vocalization with grunt which seemed to be attempt at speech.Right upper extremity spontaneous movement against gravity. Right lower extremity at least 3/5 spontaneously. Sensation, coordination and gait not able to be tested.  ASSESSMENT/PLAN [name] is a 35 y.o. female with history of multiple strokes who presented as a code stroke with symptoms suggestive of LVO. tPA was not given due to Eliquis. CTA head and neck revealed an occlusion of distal right M2 MCA with partial reconstitution and subsequent reocclusion of most M2 branches. Patient underwent thrombectomy.    Recurrent cryptogenic embolic strokes - Acute infarct of the R MCA due to right M1 occlusion s/p IR with TICI2c, cryptogenic etiology   Code Stroke: CT head No acute intracranial hemorrhage. Acute infarcts in the right MCA territory. ASPECTS 8.    CTA head & neck Occlusion of distal right M1 MCA with partial reconstitution and subsequent reocclusion of most M2 branches. Poor collaterals.  CT Perfusion imaging demonstrates core infarction 99 mL and penumbra of 34 mL. The extent of core infarction is calculated at greater than apparent loss of gray-white differentiation on noncontrast head CT. Therefore, penumbra may be greater.  2D Echo LVEF 60-65%, no structural abnormalities noted that could increase risk for CVA  Repeat CT Head  4/24 - large left MCA infarct, no midline shift. Small SAH sylvian fissure, diminishing in size  Hypercoagulable panel: Homocysteine 15.1 due to smoking, otherwise negative to date  LDL 48  HgbA1c 5.7  VTE prophylaxis - SCDs + Lovenox  aspirin 325 mg daily and Eliquis (apixaban) daily prior to admission, now on ASA 325 and coumadin (pharmacy manged) with INR goal 2.5-3.5.  INR 2.4->2.7->2.5->2.5->2.5->3.2->3.4->3.0->3.5->3.1  Therapy recommendations:  SNF/rehab  Disposition:  Pending  Hx of cryptogenic strokes  In 08/2017 patient  admitted for left sided numbness and headache.  MRI showed right cerebellum, right temporoparietal small infarcts.  EF 55 to 60%, CT head and neck unremarkable.  LDL 89, A1c 4.8.  TCD bubble study showed trivial PFO.  DVT negative.  TEE no PFO.  Hypercoagulable and autoimmune work-up negative.  Discharged with aspirin and Lipitor.  01/2018 episode of left facial numbness and left leg weakness.  CT head and neck unremarkable.  MRI negative.  06/2018 admitted to [facility] for right hand/face numbness.  MRI showed right tiny parietal infarct.  10/2020 admitted to [facility] for aphasia status post tPA.  CTA neck showed left M2 occlusion.  MRI showed left MCA and punctate right frontal infarct.  TCD bubble study potential trivial PFO, TEE tiny PFO.  11/2020 she was referred to loop recorder which was not done as physician thought she should be candidate for anticoagulation.  Started on Eliquis 5 mg twice daily.  Skin rash with blisters  Left flank skin lesion with blisters, concerning for shingles  Varicella zoster PCR negative, spoke with IC RN and precautions discontinued 5/6.   Off contact and airborne isolation; resolved  Valtrex discontinued    Hyperlipidemia  Home meds:  atrovastatin 80mg  and zetia 10mg    Will restart post CT if stable  LDL 48, goal < 70  Resumed lipitor 80 and zetia 10  Continue statin at discharge   Dysphagia- DYS diet 1  Cortrack placement for TF  SLP and RD following  Zero intake x 3 day calorie count on nocturnal feedings alone.   S/p PEG    On TF  dulcolax suppository  Birth control  Recommend OP PCP or OBGYN discontinue Depo - provera due to numerous Contraindications including hx of tobacco use, migraines, and multiple strokes.    Tobacco abuse  Current smoker  Smoking cessation counseling will be provided  Contraindicated for birth control Depo   Hx of pseudotumor cerebri Chronic migraine  Follows with Dr. 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   Other Stroke Risk Factors  Morbid Obesity, Body mass index is 42.12 kg/m., BMI >/= 30 associated with increased stroke risk, recommend weight loss, diet and exercise as appropriate   Obstructive sleep apnea   Hospital day # 33   Lissy Olivencia-Simmons, ACNP-BC Stroke NP I have personally obtained history,examined this patient, reviewed notes, independently viewed imaging studies, participated in medical decision making and plan of care.ROS completed by me personally and pertinent positives fully documented  I have made any additions or clarifications directly to the above note. Agree with note above.  Discussed with patient and pharmacist.  Recommend changing INR checks to every other day as well as lab work.  Patient difficult to place due to young age.  Continue ongoing medical management.  Greater than 50% time during this 25-minute visit was spent on counseling and coordination of care discussion with care team.  [facility]  Pearlean Brownie, MD Medical Director [facility] Pager: [phone] 05/05/2021 4:24 PM  To contact Stroke Continuity provider, please refer to [email or web address]. After hours, contact General Neurology

## 2021-05-05 NOTE — Progress Notes (Signed)
ANTICOAGULATION CONSULT NOTE  Pharmacy Consult for warfarin Indication:  Acute stroke  Allergies  Allergen Reactions  . Tape Rash    Cannot tolerate paper tape    Patient Measurements: Height: 5\' 5"  (165.1 cm) Weight: 110.2 kg (242 lb 15.2 oz) IBW/kg (Calculated) : 57  Heparin dosing weight: 72 kg    Vital Signs: Temp: 98.7 F (37.1 C) (05/24 0449) Temp Source: Axillary (05/24 0449) BP: 111/75 (05/24 0449) Pulse Rate: 92 (05/24 0449)  Labs: Recent Labs    05/03/21 0255 05/04/21 0629 05/05/21 0007  HGB  --  11.2*  --   HCT  --  36.1  --   PLT  --  245  --   LABPROT 33.0* 35.0* 32.1*  INR 3.2* 3.5* 3.1*  CREATININE  --  0.64  --     Estimated Creatinine Clearance: 122.5 mL/min (by C-G formula based on SCr of 0.64 mg/dL).  Assessment: 35 y.o female on apixaban PTA for hx prior strokes, ?DVT. Last dose PTA " unknown," admitted 04/02/21 with acute CVA. No TPA due to apixaban pta.   -4/21 s/p thrombectomy. -Neurologist consulted pharmacist on 04/08/21 to start warfarin with goal INR 2.5-3.5 per Dr. 04/10/21.  INR today remains therapeutic at 3.1 (goal of 2.5-3.5). CBC stable. No bleeding noted.   Goal of Therapy:  INR 2.5-3.5 per Neurology Monitor platelets by anticoagulation protocol: Yes   Plan:  - Warfarin 7.5 mg x1 dose at 1600 today - Monitor daily INR, CBC, s/sx bleeding  Thank you for allowing pharmacy to be a part of this patient's care.  Morrie Sheldon, PharmD PGY1 Acute Care Pharmacy Resident Please refer to [facility] for unit-specific pharmacist

## 2021-05-06 DIAGNOSIS — I63511 Cerebral infarction due to unspecified occlusion or stenosis of right middle cerebral artery: Secondary | ICD-10-CM | POA: Diagnosis not present

## 2021-05-06 LAB — CBC
HCT: 35.5 % — ABNORMAL LOW (ref 36.0–46.0)
Hemoglobin: 11.1 g/dL — ABNORMAL LOW (ref 12.0–15.0)
MCH: 28.8 pg (ref 26.0–34.0)
MCHC: 31.3 g/dL (ref 30.0–36.0)
MCV: 92.2 fL (ref 80.0–100.0)
Platelets: 257 10*3/uL (ref 150–400)
RBC: 3.85 MIL/uL — ABNORMAL LOW (ref 3.87–5.11)
RDW: 15.5 % (ref 11.5–15.5)
WBC: 8 10*3/uL (ref 4.0–10.5)
nRBC: 0 % (ref 0.0–0.2)

## 2021-05-06 LAB — GLUCOSE, CAPILLARY
Glucose-Capillary: 93 mg/dL (ref 70–99)
Glucose-Capillary: 117 mg/dL — ABNORMAL HIGH (ref 70–99)
Glucose-Capillary: 97 mg/dL (ref 70–99)
Glucose-Capillary: 108 mg/dL — ABNORMAL HIGH (ref 70–99)
Glucose-Capillary: 97 mg/dL (ref 70–99)
Glucose-Capillary: 90 mg/dL (ref 70–99)

## 2021-05-06 LAB — BASIC METABOLIC PANEL
Anion gap: 7 (ref 5–15)
BUN: 18 mg/dL (ref 6–20)
CO2: 25 mmol/L (ref 22–32)
Calcium: 8.9 mg/dL (ref 8.9–10.3)
Chloride: 106 mmol/L (ref 98–111)
Creatinine, Ser: 0.71 mg/dL (ref 0.44–1.00)
GFR, Estimated: >60 mL/min (ref >60)
Glucose, Bld: 93 mg/dL (ref 70–99)
Potassium: 3.5 mmol/L (ref 3.5–5.1)
Sodium: 138 mmol/L (ref 135–145)

## 2021-05-06 MED ORDER — WARFARIN SODIUM 7.5 MG PO TABS
7.5000 mg | ORAL_TABLET | Freq: Once | ORAL | Status: AC
  Administered 2021-05-06: 7.5 mg
  Filled 2021-05-06: qty 1

## 2021-05-06 NOTE — Progress Notes (Signed)
ANTICOAGULATION CONSULT NOTE  Pharmacy Consult for warfarin Indication:  Acute stroke  Allergies  Allergen Reactions  . Tape Rash    Cannot tolerate paper tape    Patient Measurements: Height: 5\' 5"  (165.1 cm) Weight: 110.2 kg (242 lb 15.2 oz) IBW/kg (Calculated) : 57  Heparin dosing weight: 72 kg    Vital Signs: Temp: 99.8 F (37.7 C) (05/25 0300) Temp Source: Axillary (05/25 0300) BP: 111/67 (05/25 0300) Pulse Rate: 93 (05/25 0300)  Labs: Recent Labs    05/04/21 0629 05/05/21 0007 05/06/21 0455  HGB 11.2*  --  11.1*  HCT 36.1  --  35.5*  PLT 245  --  257  LABPROT 35.0* 32.1*  --   INR 3.5* 3.1*  --   CREATININE 0.64  --  0.71    Estimated Creatinine Clearance: 122.5 mL/min (by C-G formula based on SCr of 0.71 mg/dL).  Assessment: 35 y.o female on apixaban PTA for hx prior strokes, ?DVT. Last dose PTA " unknown," admitted 04/02/21 with acute CVA. No TPA due to apixaban pta.   -4/21 s/p thrombectomy. -Neurologist consulted pharmacist on 04/08/21 to start warfarin with goal INR 2.5-3.5 per Dr. 04/10/21.  INR yesterday was therapeutic at 3.1 (goal of 2.5-3.5). Attempting to transition to less frequent INR checks (weekly if able). Still titrating to an appropriate dose so checking sporadically until stable. CBC stable. No bleeding noted. Will repeat yesterday's dose and follow up with INR tomorrow.   Goal of Therapy:  INR 2.5-3.5 per Neurology Monitor platelets by anticoagulation protocol: Yes   Plan:  - Warfarin 7.5mg  x1 at 1600 today - Monitor INR every other day, follow for s/sx bleeding  Thank you for allowing pharmacy to be a part of this patient's care.  Morrie Sheldon, PharmD PGY1 Acute Care Pharmacy Resident Please refer to [facility] for unit-specific pharmacist

## 2021-05-06 NOTE — Progress Notes (Signed)
Physical Therapy Treatment Patient Details Name: JADINE BRUMLEY MRN: [id number] DOB: May 20, 1986 Today's Date: 05/06/2021    History of Present Illness The pt is a 35 yo female presenting with L sided facial droop and L sided weakness on 4/21. Underwent angiogram and revascularization on 4/21. CT head showing large right MCA territory infarct with 4 mm leftward midline shift. S/p PEG 5/5. 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    PT Comments    Pt making strong/ steady progress towards her goals. Session conducted in conjunction with OT to maximize quality and safety of session. Pt making excellent gains with sitting balance, communication, command following, awareness, and L sided attention. Pt able to sustain attention to L side while applying lotion to LUE and L leg ~ 3 mins with multimodal cues to attend to L side. Pt needs her attention to be gained before each activity as she easily gets distracted. Pt with good sitting balance EOB with RUE supported and unsupported during session, improved righting reaction noted from EOB with activation noted on L leg when pt beginning to feel off balance. Pt able to initiate scooting towards HOB with MAX A +2. Pt also only needing maxAx1 for physical assistance to transition supine > sit as pt was able to assist through following some commands this date. These are significant improvements compared to her recent PLOF while in the hospital. Pt even becoming emotional, presumably in regards to her current condition, indicating increased awareness into her deficits and L side. Updated D/C recs to inpatient rehab d/t continued progression with therapies. Pt would greatly benefit from an intensive inpatient rehab program that specializes in stroke rehab to maximize functional independence. Will continue to follow acutely.   Follow Up Recommendations  Other (comment);Supervision/Assistance - 24 hour (Inpatient  Rehab with [facility] specialty rehab team)     Equipment Recommendations  Other (comment) (defer to next venue of care)    Recommendations for Other Services       Precautions / Restrictions Precautions Precautions: Fall Precaution Comments: abdominal binder; peg Required Braces or Orthoses: Other Brace Other Brace: L PRAFO Restrictions Weight Bearing Restrictions: No    Mobility  Bed Mobility Overal bed mobility: Needs Assistance Bed Mobility: Supine to Sit;Sit to Supine     Supine to sit: Max assist;+2 for safety/equipment (MAX A +1 to elevate trunk and maneuver BLEs to EOB, +2 for safety) Sit to supine: Max assist;+2 for physical assistance   General bed mobility comments: pt able to transition to EOB with MAX A +1 ( +2 for safety), pt initiating returning to supine but needed MAX A +2 to lower trunk safely, attempted to have pt initiate pulling LLE up onto bed using momentum however pt unable to elevate LLE without assist    Transfers Overall transfer level: Needs assistance Equipment used: 2 person hand held assist (+3 for safety) Transfers: Lateral/Scoot Transfers          Lateral/Scoot Transfers: Max assist;+2 physical assistance (+3 for safety) General transfer comment: pt able to initiate lateral scoots to [facility] going towards L side with MAX A +2  Ambulation/Gait             General Gait Details: unable   Stairs             Wheelchair Mobility    Modified Rankin (Stroke Patients Only) Modified Rankin (Stroke Patients Only) Pre-Morbid Rankin Score: Moderate disability Modified Rankin: Severe disability  Balance Overall balance assessment: Needs assistance Sitting-balance support: Feet supported;Single extremity supported Sitting balance-Leahy Scale: Fair Sitting balance - Comments: sitting EOB during session with close supervision with moments of no UE support and moments of RUE supported on end of bed. attempted to challenge balance by  elevating bed to facilitate activation of LLE, pt with good righting reaction noted with pt noted to shift weight to R and use RUE to hold onto end of bed as righting response     Standing balance-Leahy Scale: Zero Standing balance comment: unable to full clear buttocks off bed                            Cognition Arousal/Alertness: Awake/alert Behavior During Therapy: Anxious;Impulsive;Restless (emotional at times) Overall Cognitive Status: Impaired/Different from baseline Area of Impairment: Attention;Following commands;Awareness;Problem solving;Safety/judgement                   Current Attention Level: Sustained (for short bouts)   Following Commands: Follows one step commands inconsistently;Follows one step commands with increased time (multi-modal) Safety/Judgement: Decreased awareness of safety;Decreased awareness of deficits Awareness: Emergent Problem Solving: Slow processing;Decreased initiation;Difficulty sequencing;Requires verbal cues;Requires tactile cues General Comments: pt noted to state phrases such as "im sorry," "that hurt" "lay down" and did alluded to saying something about her family also stated " i didnt want this" pt emotional during session crying and reaching for wash cloth to wipe her eyes. improved overall awareness noted when attempting to challenge balance from EOB. pt following one step commands inconsistently but will sustain attention to stimulus ~ 4 mins at at time on L side especially when applying stimulus to LUE and LLE such as lotion      Exercises General Exercises - Upper Extremity Shoulder Flexion: PROM;5 reps;Supine Shoulder Horizontal ABduction: PROM;Left;5 reps;Supine Shoulder Horizontal ADduction: PROM;Left;5 reps;Supine Elbow Flexion: PROM;Left;10 reps;Supine Elbow Extension: Left;PROM;10 reps;Supine Wrist Flexion: PROM;Left;5 reps;Supine Wrist Extension: Left;PROM;5 reps;Supine Digit Composite Flexion: PROM;Left;20  reps;Supine Composite Extension: PROM;Left;5 reps;Supine    General Comments General comments (skin integrity, edema, etc.): mild redness noted on posterior aspect of pts L hand, ajusted straps on resting hand splint. education provided to nurse on splint wearing schedule. additionally posted sign behind pts bed to reinforce splint wearing schedule      Pertinent Vitals/Pain Pain Assessment: Faces Faces Pain Scale: Hurts even more Pain Location: head when brushing her hair but also feel pt may have been experiencing pain in her stomach from peg based on pts gestures to her abdomen Pain Descriptors / Indicators: Discomfort;Grimacing;Guarding;Crying;Moaning Pain Intervention(s): Monitored during session;Limited activity within patient's tolerance;Repositioned    Home Living                      Prior Function            PT Goals (current goals can now be found in the care plan section) Acute Rehab PT Goals Patient Stated Goal: lay down PT Goal Formulation: Patient unable to participate in goal setting Time For Goal Achievement: 05/11/21 Potential to Achieve Goals: Good Progress towards PT goals: Progressing toward goals    Frequency    Min 4X/week      PT Plan Discharge plan needs to be updated;Frequency needs to be updated    Co-evaluation PT/OT/SLP Co-Evaluation/Treatment: Yes Reason for Co-Treatment: Complexity of the patient's impairments (multi-system involvement);Necessary to address cognition/behavior during functional activity;For patient/therapist safety;To address functional/ADL transfers PT goals addressed during session: Mobility/safety  with mobility;Balance        AM-PAC PT "6 Clicks" Mobility   Outcome Measure  Help needed turning from your back to your side while in a flat bed without using bedrails?: A Lot Help needed moving from lying on your back to sitting on the side of a flat bed without using bedrails?: A Lot Help needed moving to and  from a bed to a chair (including a wheelchair)?: Total Help needed standing up from a chair using your arms (e.g., wheelchair or bedside chair)?: Total Help needed to walk in hospital room?: Total Help needed climbing 3-5 steps with a railing? : Total 6 Click Score: 8    End of Session   Activity Tolerance: Patient tolerated treatment well Patient left: in bed;with call bell/phone within reach;with bed alarm set;with SCD's reapplied;Other (comment) (with L PRAFO donned) Nurse Communication: Mobility status PT Visit Diagnosis: Hemiplegia and hemiparesis;Other abnormalities of gait and mobility (R26.89);Difficulty in walking, not elsewhere classified (R26.2);Other symptoms and signs involving the nervous system (R29.898);Muscle weakness (generalized) (M62.81) Hemiplegia - Right/Left: Left Hemiplegia - dominant/non-dominant: Non-dominant Hemiplegia - caused by: Cerebral infarction     Time: 1100-1142 PT Time Calculation (min) (ACUTE ONLY): 42 min  Charges:  $Therapeutic Activity: 8-22 mins $Neuromuscular Re-education: 8-22 mins                     Raymond Gurney, PT, DPT Acute Rehabilitation Services  Pager: [phone] Office: [phone]    Jewel Baize 05/06/2021, 4:53 PM

## 2021-05-06 NOTE — Progress Notes (Signed)
Inpatient Rehab Admissions Coordinator Note:   At request of TOC, pt was screened for CIR candidacy by Megan Salon, MS CCC-SLP. At this time, Pt.appears unable to tolerate CIR level therapies and is currently Total A +2 for bed mobility and transfers. I will not pursue a consult at this time. Please contact me with questions.   Megan Salon, MS, CCC-SLP Rehab Admissions Coordinator  [phone] (celll) [phone] (office)

## 2021-05-06 NOTE — Progress Notes (Signed)
Occupational Therapy Treatment Patient Details Name: Krista Maddox MRN: [id number] DOB: [birth year] Today's Date: 05/06/2021    History of present illness The pt is a 35 yo female presenting with L sided facial droop and L sided weakness on 4/21. Underwent angiogram and revascularization on 4/21. CT head showing large right MCA territory infarct with 4 mm leftward midline shift. S/p PEG 5/5. 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   OT comments  Returned for second visit to assess fit of L resting hand splint. Splint donned ~ 4 hours with mild redness on posterior aspects of pts hand, adjusted straps to accommodate for redness. Sat on L side of pt with pt able to attend to OTA on L side noted to become emotional while looking at picture of pts daughter. Pt stating " Its her last year" and "I miss my baby" and what sounded like " I didn't get to call her"; tentative plan to try to call daughter next OT session. Completed light PROM to LUE during session as indicated below ( no activation noted). Education provided to RN on splint wearing schedule ( 8 hours on at night, and off during the day) sign posted behind pts bed to increase carryover. Will continue to follow acutely per POC.   Follow Up Recommendations  Other (comment) (intensive inpatient rehab setting ( CVA team))    Equipment Recommendations  3 in 1 bedside commode    Recommendations for Other Services      Precautions / Restrictions Precautions Precautions: Fall Precaution Comments: abdominal binder; peg Required Braces or Orthoses: Other Brace Other Brace: L PRAFO; L resting hand splint to be worn 8 hours at night Restrictions Weight Bearing Restrictions: No       Mobility Bed Mobility    Transfers    Balance                           ADL either performed or assessed with clinical judgement   ADL General ADL Comments: session focus on implementing a  splint wearing schedule for L hand, returned for second session to doff L resting hand splint     Vision     Perception     Praxis      Cognition Arousal/Alertness: Awake/alert Behavior During Therapy: Restless (emotional) Overall Cognitive Status: Impaired/Different from baseline Area of Impairment: Attention;Following commands;Awareness;Problem solving;Safety/judgement                   Current Attention Level: Sustained (for short bouts)   Following Commands: Follows one step commands inconsistently;Follows one step commands with increased time Safety/Judgement: Decreased awareness of safety;Decreased awareness of deficits Awareness: Emergent Problem Solving: Slow processing;Decreased initiation;Difficulty sequencing;Requires verbal cues;Requires tactile cues        Exercises General Exercises - Upper Extremity Shoulder Flexion: PROM;5 reps;Supine Shoulder Horizontal ABduction: PROM;Left;5 reps;Supine Shoulder Horizontal ADduction: PROM;Left;5 reps;Supine Elbow Flexion: PROM;Left;10 reps;Supine Elbow Extension: Left;PROM;10 reps;Supine Wrist Flexion: PROM;Left;5 reps;Supine Wrist Extension: Left;PROM;5 reps;Supine Digit Composite Flexion: PROM;Left;20 reps;Supine Composite Extension: PROM;Left;5 reps;Supine   Shoulder Instructions       General Comments mild redness noted on posterior aspect of pts L hand, ajusted straps on resting hand splint. education provided to nurse on splint wearing schedule. additionally posted sign behind pts bed to reinforce splint wearing schedule    Pertinent Vitals/ Pain       Pain Assessment: Faces Faces Pain Scale: No hurt  Home Living                                          Prior Functioning/Environment              Frequency  Min 3X/week        Progress Toward Goals  OT Goals(current goals can now be found in the care plan section)  Progress towards OT goals: Progressing toward  goals  Acute Rehab OT Goals Patient Stated Goal: I miss my baby OT Goal Formulation: With patient Time For Goal Achievement: 05/15/21 Potential to Achieve Goals: Fair  Plan Discharge plan remains appropriate;Frequency remains appropriate    Co-evaluation         AM-PAC OT "6 Clicks" Daily Activity     Outcome Measure   Help from another person eating meals?: Total Help from another person taking care of personal grooming?: A Lot Help from another person toileting, which includes using toliet, bedpan, or urinal?: Total Help from another person bathing (including washing, rinsing, drying)?: Total Help from another person to put on and taking off regular upper body clothing?: A Lot Help from another person to put on and taking off regular lower body clothing?: Total 6 Click Score: 8    End of Session Equipment Utilized During Treatment: Other (comment) (L resting hand splint)  OT Visit Diagnosis: Unsteadiness on feet (R26.81);Other abnormalities of gait and mobility (R26.89);Muscle weakness (generalized) (M62.81);Low vision, both eyes (H54.2);Apraxia (R48.2);Other symptoms and signs involving cognitive function;Hemiplegia and hemiparesis Symptoms and signs involving cognitive functions: Cerebral infarction Hemiplegia - Right/Left: Left Hemiplegia - dominant/non-dominant: Non-Dominant Hemiplegia - caused by: Cerebral infarction   Activity Tolerance Patient tolerated treatment well   Patient Left in bed;with call bell/phone within reach;with nursing/sitter in room;Other (comment) (NT present, changing pt brief' pad)   Nurse Communication Mobility status;Other (comment) (splint wearing schedule)        Time: 9518-8416 OT Time Calculation (min): 21 min  Charges: OT General Charges $OT Visit: 1 Visit OT Treatments  $Orthotics/Prosthetics Check: 8-22 mins  Lenor Derrick., COTA/L Acute Rehabilitation Services [phone]   Barron Schmid 05/06/2021, 4:07  PM

## 2021-05-06 NOTE — Progress Notes (Signed)
STROKE TEAM PROGRESS NOTE   INTERVAL HISTORY No acute events.  VSS.  Tearful this am.  Patient more awake and interactive today.  Attempting to communicate. Dysarthric and hypophonic.  No other change in neuro exam.   Inpatient Rehab admission coordinator evaluated patient.  She unable to tolerate CIR level of therapies yet.  Continue daily PT/OT for now.   INR 3.1. Coumadin 7.5mg  daily.  Will now check INR every other day.    Vitals:   05/06/21 0300 05/06/21 0407 05/06/21 0826 05/06/21 1157  BP: 111/67  119/80 105/73  Pulse: 93  (!) 109 95  Resp: 20  20 19   Temp: 99.8 F (37.7 C)  98.6 F (37 C) 98.6 F (37 C)  TempSrc: Axillary  Oral Oral  SpO2: 97%  99% 100%  Weight:  110.2 kg    Height:       CBC:  Recent Labs  Lab 05/04/21 0629 05/06/21 0455  WBC 8.1 8.0  HGB 11.2* 11.1*  HCT 36.1 35.5*  MCV 91.4 92.2  PLT 245 257   Basic Metabolic Panel:  Recent Labs  Lab 05/01/21 0511 05/04/21 0629 05/06/21 0455  NA 137 138 138  K 3.8 3.5 3.5  CL 107 107 106  CO2 24 23 25   GLUCOSE 105* 105* 93  BUN 21* 16 18  CREATININE 0.69 0.64 0.71  CALCIUM 9.1 8.8* 8.9  MG 2.0  --   --    IMAGING past 24 hours No results found.  PHYSICAL EXAM  Temp:  [98.2 F (36.8 C)-99.8 F (37.7 C)] 98.6 F (37 C) (05/25 1157) Pulse Rate:  [93-109] 95 (05/25 1157) Resp:  [19-20] 19 (05/25 1157) BP: (103-119)/(67-81) 105/73 (05/25 1157) SpO2:  [95 %-100 %] 100 % (05/25 1157) Weight:  [110.2 kg] 110.2 kg (05/25 0407)  General - morbid obesity, well developed, in no apparent distress.  Resp: No extra work of breathing.  Cardiovascular - Regular rhythm and rate. Abd: soft, round, NTTP, Non-distended  Ext: warm and well perfused  Neuro - Eyes open, awake alert, attempted to speak, dysarthric, hypophonic, not following commands. Able to participate with EOMs: Right gaze preference, but able to cross midline and left gaze incomplete.  Blinking to visual threat bilaterally. Left  facial droop. Left upper extremity 0/5 but significantly increased muscle tone, left LE slight withdraw to pain but also with increased muscle tone.Right upper extremity spontaneous movement against gravity. Right lower extremity at least 3/5 spontaneously. Sensation, coordination and gait not able to be tested.  ASSESSMENT/PLAN [name] is a 35 y.o. female with history of multiple strokes who presented as a code stroke with symptoms suggestive of LVO. tPA was not given due to Eliquis. CTA head and neck revealed an occlusion of distal right M2 MCA with partial reconstitution and subsequent reocclusion of most M2 branches. Patient underwent thrombectomy.    Recurrent cryptogenic embolic strokes - Acute infarct of the R MCA due to right M1 occlusion s/p IR with TICI2c, cryptogenic etiology   Code Stroke: CT head No acute intracranial hemorrhage. Acute infarcts in the right MCA territory. ASPECTS 8.    CTA head & neck Occlusion of distal right M1 MCA with partial reconstitution and subsequent reocclusion of most M2 branches. Poor collaterals.  CT Perfusion imaging demonstrates core infarction 99 mL and penumbra of 34 mL. The extent of core infarction is calculated at greater than apparent loss of gray-white differentiation on noncontrast head CT. Therefore, penumbra may be greater.  2D  Echo LVEF 60-65%, no structural abnormalities noted that could increase risk for CVA  Repeat CT Head 4/24 - large left MCA infarct, no midline shift. Small SAH sylvian fissure, diminishing in size  Hypercoagulable panel: Homocysteine 15.1 due to smoking, otherwise negative to date  LDL 48  HgbA1c 5.7  VTE prophylaxis - SCDs + Lovenox  aspirin 325 mg daily and Eliquis (apixaban) daily prior to admission, now on ASA 325 and coumadin (pharmacy manged) with INR goal 2.5-3.5.  INR 2.4->2.7->2.5->2.5->2.5->3.2->3.4->3.0->3.5->3.1  Therapy recommendations:  Rehab once appropriate  Disposition:   Pending  Hx of cryptogenic strokes  In 08/2017 patient admitted for left sided numbness and headache.  MRI showed right cerebellum, right temporoparietal small infarcts.  EF 55 to 60%, CT head and neck unremarkable.  LDL 89, A1c 4.8.  TCD bubble study showed trivial PFO.  DVT negative.  TEE no PFO.  Hypercoagulable and autoimmune work-up negative.  Discharged with aspirin and Lipitor.  01/2018 episode of left facial numbness and left leg weakness.  CT head and neck unremarkable.  MRI negative.  06/2018 admitted to [facility] for right hand/face numbness.  MRI showed right tiny parietal infarct.  10/2020 admitted to [facility] for aphasia status post tPA.  CTA neck showed left M2 occlusion.  MRI showed left MCA and punctate right frontal infarct.  TCD bubble study potential trivial PFO, TEE tiny PFO.  11/2020 she was referred to loop recorder which was not done as physician thought she should be candidate for anticoagulation.  Started on Eliquis 5 mg twice daily.  Skin rash with blisters  Left flank skin lesion with blisters, concerning for shingles  Varicella zoster PCR negative, spoke with IC RN and precautions discontinued 5/6.   Off contact and airborne isolation; resolved  Valtrex discontinued   Hyperlipidemia  Home meds:  atrovastatin 80mg  and zetia 10mg    Will restart post CT if stable  LDL 48, goal < 70  Resumed lipitor 80 and zetia 10  Continue statin at discharge   Dysphagia- DYS diet 1  SLP and RD following  Zero intake x 3 day calorie count on nocturnal feedings alone.   S/p PEG    Tube feeds: - Osmolite 1.5 @ 50 ml/hr (1200 ml/day)                           - ProSource TF 90 ml BID                           -Free water flushes of137ml q4hours  dulcolax suppository  Birth control  Recommend OP PCP or OBGYN discontinue Depo - provera due to numerous Contraindications including hx of tobacco use, migraines, and multiple strokes.    Tobacco abuse  Current  smoker  Smoking cessation counseling will be provided  Contraindicated for birth control Depo   Hx of pseudotumor cerebri Chronic migraine  Follows with Dr. 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   Other Stroke Risk Factors  Morbid Obesity, Body mass index is 42.12 kg/m., BMI >/= 30 associated with increased stroke risk, recommend weight loss, diet and exercise as appropriate   Obstructive sleep apnea   Hospital day # 34   Krista Maddox, ACNP-BC Stroke NP 05/06/2021 1:46 PM I have personally obtained history,examined this patient, reviewed notes, independently viewed imaging studies, participated in medical decision making  and plan of care.ROS completed by me personally and pertinent positives fully documented  I have made any additions or clarifications directly to the above note. Agree with note above.  Still awaiting nursing home bed.  Delia Heady, MD Medical Director [facility] Pager: [phone] 05/06/2021 2:30 PM  To contact Stroke Continuity provider, please refer to [email or web address]. After hours, contact General Neurology

## 2021-05-06 NOTE — Progress Notes (Signed)
Occupational Therapy Treatment Patient Details Name: Krista Maddox MRN: [id number] DOB: 1986/03/13 Today's Date: 05/06/2021    History of present illness The pt is a 35 yo female presenting with L sided facial droop and L sided weakness on 4/21. Underwent angiogram and revascularization on 4/21. CT head showing large right MCA territory infarct with 4 mm leftward midline shift. S/p PEG 5/5. 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   OT comments  Pt making strong/ steady progress towards OT goals this session. Session conducted in conjunction with PT. Pt making excellent gains with sitting balance, communication and L sided attention. Pt able to sustain attention to L side while applying lotion to LUE and LLE ~ 3 mins with multimodal cues to attend to L side. Pt with good sitting balance EOB with RUE supported and unsupported during session, improved righting reaction noted from EOB with activation noted on LLE when pt beginning to feel off balance. Pt stating "that hurts" when attempting to brush through pts hair. Pt able to initiate scooting towards HOB with MAX A +2. Updated DC recs to inpatient rehab d/t continued progression with therapies. Pt would greatly benefit from an intensive inpatient rehab program that specializes in stroke rehab to maximize functional independence. Will continue to follow acutely per POC and updated DC recs as pt progresses.    Follow Up Recommendations  Other (comment) (intensive inpatient rehab setting ( CVA team))    Equipment Recommendations  3 in 1 bedside commode    Recommendations for Other Services      Precautions / Restrictions Precautions Precautions: Fall Precaution Comments: abdominal binder; peg Required Braces or Orthoses: Other Brace Other Brace: L PRAFO Restrictions Weight Bearing Restrictions: No       Mobility Bed Mobility Overal bed mobility: Needs Assistance Bed Mobility:  Supine to Sit;Sit to Supine     Supine to sit: Max assist;+2 for safety/equipment (MAX A +1 to elevate trunk and maneuver BLEs to EOB, +2 for safety) Sit to supine: Max assist;+2 for physical assistance   General bed mobility comments: pt able to transition to EOB with MAX A +1 ( +2 for safety), pt initiating returning to supine but needed MAX A +2 to lower trunk safely, attempted to have pt initiate pulling LLE up onto bed using momentum however pt unable to elevate LLE without assist    Transfers Overall transfer level: Needs assistance Equipment used: 2 person hand held assist (+3 for safety) Transfers: Lateral/Scoot Transfers          Lateral/Scoot Transfers: Max assist;+2 physical assistance (+3 for safety) General transfer comment: pt able to initiate lateral scoots to [facility] going towards L side with MAX A +2    Balance Overall balance assessment: Needs assistance Sitting-balance support: Feet supported;Single extremity supported Sitting balance-Leahy Scale: Fair Sitting balance - Comments: sitting EOB during session with close supervision with moments of no UE support and moments of RUE supported on end of bed. attempted to challenge balance by elevating bed to facilitate activation of LLE, pt with good righting reaction noted with pt noted to shift weight to R and use RUE to hold onto end of bed as righting response     Standing balance-Leahy Scale: Zero Standing balance comment: unable to full clear buttocks off bed                           ADL either performed or  assessed with clinical judgement   ADL Overall ADL's : Needs assistance/impaired     Grooming: Wash/dry face;Bed level;Brushing hair;Set up Grooming Details (indicate cue type and reason): reaching for wash cloth and wiping face numerous times during session; total A for brushing pts hair                 Toilet Transfer: +2 for physical assistance;+2 for safety/equipment;Total  assistance Toilet Transfer Details (indicate cue type and reason): pt able to initiate lateral scoot to [facility] with total A +2         Functional mobility during ADLs: Maximal assistance;+2 for physical assistance;Total assistance (bed mobility only; total A +2 for lateral scoots to [facility]) General ADL Comments: pt making excellent progress towards OT goals this session with pt noted to have improved righting reactions when sitting EOB, improved initiation during lateral scoots to [facility], and stating more phrases this session     Vision   Additional Comments: abel to track to L with and maintain attention ~ 4 mins at a time   Perception     Praxis      Cognition Arousal/Alertness: Awake/alert Behavior During Therapy: Anxious;Impulsive;Restless (emotional at times) Overall Cognitive Status: Impaired/Different from baseline Area of Impairment: Attention;Following commands;Awareness;Problem solving;Safety/judgement                   Current Attention Level: Sustained (for short bouts)   Following Commands: Follows one step commands inconsistently;Follows one step commands with increased time Safety/Judgement: Decreased awareness of safety;Decreased awareness of deficits Awareness: Emergent Problem Solving: Slow processing;Decreased initiation;Difficulty sequencing;Requires verbal cues;Requires tactile cues General Comments: pt noted to state phrases such as "im sorry," "that hurt" "lay down" and did alluded to saying something about her family also stated " i didnt want this" pt emotional during session crying and reaching for wash cloth to wipe her eyes. improved overall awareness noted when attempting to challenge balance from EOB. pt following one step commands inconsistently but will sustain attention to stimulus ~ 4 mins at at time on L side especially when applying stimulus to LUE and LLE such as lotion        Exercises     Shoulder Instructions       General Comments VSS,  donned L resting hand splint during session, will f/u with second session to assess fit and establish splint wear schedule    Pertinent Vitals/ Pain       Pain Assessment: Faces Faces Pain Scale: Hurts even more Pain Location: head when brushing her hair but also feel pt may have been experiencing pain in her stomach from peg based on pts gestures to her abdomen Pain Descriptors / Indicators: Discomfort;Grimacing;Guarding;Crying;Moaning Pain Intervention(s): Limited activity within patient's tolerance;Monitored during session;Repositioned  Home Living                                          Prior Functioning/Environment              Frequency  Min 3X/week        Progress Toward Goals  OT Goals(current goals can now be found in the care plan section)  Progress towards OT goals: Progressing toward goals  Acute Rehab OT Goals Patient Stated Goal: "i didnt want it this way" OT Goal Formulation: With patient Time For Goal Achievement: 05/15/21 Potential to Achieve Goals: Fair  Plan Discharge plan needs to  be updated;Frequency needs to be updated    Co-evaluation      Reason for Co-Treatment: Complexity of the patient's impairments (multi-system involvement);To address functional/ADL transfers;Necessary to address cognition/behavior during functional activity;For patient/therapist safety   OT goals addressed during session: ADL's and self-care;Other (comment) (establish splint wear)      AM-PAC OT "6 Clicks" Daily Activity     Outcome Measure   Help from another person eating meals?: Total Help from another person taking care of personal grooming?: A Lot Help from another person toileting, which includes using toliet, bedpan, or urinal?: Total Help from another person bathing (including washing, rinsing, drying)?: Total Help from another person to put on and taking off regular upper body clothing?: A Lot Help from another person to put on and taking  off regular lower body clothing?: Total 6 Click Score: 8    End of Session    OT Visit Diagnosis: Unsteadiness on feet (R26.81);Other abnormalities of gait and mobility (R26.89);Muscle weakness (generalized) (M62.81);Low vision, both eyes (H54.2);Apraxia (R48.2);Other symptoms and signs involving cognitive function;Hemiplegia and hemiparesis Symptoms and signs involving cognitive functions: Cerebral infarction Hemiplegia - Right/Left: Left Hemiplegia - dominant/non-dominant: Non-Dominant Hemiplegia - caused by: Cerebral infarction   Activity Tolerance Patient tolerated treatment well   Patient Left in bed;with call bell/phone within reach;with bed alarm set;Other (comment) (kept R mitt off with approval from RN)   Nurse Communication Mobility status;Other (comment) (R mitt off)        Time: 3086-5784 OT Time Calculation (min): 42 min  Charges: OT General Charges $OT Visit: 1 Visit OT Treatments $Self Care/Home Management : 8-22 mins  Lenor Derrick., COTA/L Acute Rehabilitation Services [phone]    Barron Schmid 05/06/2021, 1:12 PM

## 2021-05-06 NOTE — Progress Notes (Signed)
Nutrition Follow-up  DOCUMENTATION CODES:   Obesity unspecified  INTERVENTION:   Continue tube feeds via PEG: - Osmolite 1.5 @ 50 ml/hr (1200 ml/day) - ProSource TF 90 ml BID -Free water flushes of191m q4hours  Tube feeding regimen provides1960kcal, 119grams of protein, and 9137mof H2O.  Total free water with flushes:[id number] DIAGNOSIS:   Inadequate oral intake related to inability to eat as evidenced by NPO status.  Ongoing, being addressed via TF  GOAL:   Patient will meet greater than or equal to 90% of their needs  Met via TF  MONITOR:   TF tolerance,Weight trends,Labs,I & O's  REASON FOR ASSESSMENT:   Consult Enteral/tube feeding initiation and management  ASSESSMENT:   Pt with PMH of prior strokes x 4 with mild residual aphasia and L sided numbness, chronic MHAs, HLD, TTP duing pregnancy admitted for R gaze preference, L sided weakness, aphasia and L facial droop with R MCA stroke s.p thrombectomy.  4/22-Cortrak placed (gastric tip) 4/27-diet advanced to dysphagia 1 with thin liquids 5/05 -Cortrak removed,PEG placement  Pt is awaiting SNF placement.  Weight stable compared to last RD visit. Met with pt at bedside. Pt smiling at time of visit but not verbalizing. Tube feeds infusing as ordered.  Discussed pt with RN who reports pt is tolerating tube feeds without issue. Will continue with current regimen.  Admit weight: 114.8 kg Current weight: 110.3 kg  Current TF: Osmolite 1.5 @ 50 ml/hr, ProSource TF 90 ml BID, free water flushes of 150 ml q 4 hours  Medications reviewed and include: protonix, miralax, senna, warfarin  Labs reviewed. CBG's: 83-117 x 24 hours  UOP: 1750 ml x 24 hours  Diet Order:   Diet Order            Diet NPO time specified  Diet effective midnight                 EDUCATION NEEDS:   No education needs have been identified at this time  Skin:  Skin Assessment: Reviewed RN  Assessment  Last BM:  05/05/21  Height:   Ht Readings from Last 1 Encounters:  04/02/21 5' 5"  (1.651 m)    Weight:   Wt Readings from Last 1 Encounters:  05/06/21 110.2 kg    Ideal Body Weight:  56.8 kg  BMI:  Body mass index is 40.43 kg/m.  Estimated Nutritional Needs:   Kcal:  1900-2100  Protein:  110-125 grams  Fluid:  > 2 L/day    KatGustavus BryantS, RD, LDN Inpatient Clinical Dietitian Please see AMiON for contact information.

## 2021-05-07 DIAGNOSIS — I63511 Cerebral infarction due to unspecified occlusion or stenosis of right middle cerebral artery: Secondary | ICD-10-CM | POA: Diagnosis not present

## 2021-05-07 LAB — GLUCOSE, CAPILLARY
Glucose-Capillary: 115 mg/dL — ABNORMAL HIGH (ref 70–99)
Glucose-Capillary: 99 mg/dL (ref 70–99)
Glucose-Capillary: 92 mg/dL (ref 70–99)
Glucose-Capillary: 95 mg/dL (ref 70–99)
Glucose-Capillary: 85 mg/dL (ref 70–99)
Glucose-Capillary: 97 mg/dL (ref 70–99)

## 2021-05-07 LAB — PROTIME-INR
INR: 2.5 — ABNORMAL HIGH (ref 0.8–1.2)
Prothrombin Time: 27.3 seconds — ABNORMAL HIGH (ref 11.4–15.2)

## 2021-05-07 MED ORDER — VENLAFAXINE HCL 37.5 MG PO TABS
150.0000 mg | ORAL_TABLET | Freq: Two times a day (BID) | ORAL | Status: DC
  Administered 2021-05-07 – 2021-05-15 (×17): 150 mg
  Filled 2021-05-07 (×17): qty 4

## 2021-05-07 MED ORDER — WARFARIN SODIUM 5 MG PO TABS
10.0000 mg | ORAL_TABLET | Freq: Once | ORAL | Status: AC
  Administered 2021-05-07: 10 mg
  Filled 2021-05-07: qty 2

## 2021-05-07 NOTE — Progress Notes (Signed)
STROKE TEAM PROGRESS NOTE   INTERVAL HISTORY No acute events.  Afebrile.  VSS.  Initially smiling when we walked into the room then she became tearful during the neuro exam.  Patient appears frustrated about not being able to communicate effectively.  She is very dysarthric and her voice appears stronger. No other change in neuro exam.   Awaiting inpatient rehab with [facility] specialty rehab team.   INR 2.5. Coumadin 10mg  today.  Will now check INR every other day.    Vitals:   05/06/21 2209 05/06/21 2301 05/07/21 0302 05/07/21 0730  BP: 114/72 108/67 121/68 99/70  Pulse: 98 92 91 100  Resp:  19 19 18   Temp:  99.4 F (37.4 C) 98.3 F (36.8 C) 99.4 F (37.4 C)  TempSrc:  Axillary Axillary Axillary  SpO2:  96% 99% 96%  Weight:      Height:       CBC:  Recent Labs  Lab 05/04/21 0629 05/06/21 0455  WBC 8.1 8.0  HGB 11.2* 11.1*  HCT 36.1 35.5*  MCV 91.4 92.2  PLT 245 257   Basic Metabolic Panel:  Recent Labs  Lab 05/01/21 0511 05/04/21 0629 05/06/21 0455  NA 137 138 138  K 3.8 3.5 3.5  CL 107 107 106  CO2 24 23 25   GLUCOSE 105* 105* 93  BUN 21* 16 18  CREATININE 0.69 0.64 0.71  CALCIUM 9.1 8.8* 8.9  MG 2.0  --   --    IMAGING past 24 hours No results found.  PHYSICAL EXAM  Temp:  [98.3 F (36.8 C)-99.4 F (37.4 C)] 99.4 F (37.4 C) (05/26 0730) Pulse Rate:  [91-100] 100 (05/26 0730) Resp:  [17-19] 18 (05/26 0730) BP: (99-127)/(67-80) 99/70 (05/26 0730) SpO2:  [96 %-100 %] 96 % (05/26 0730)  General - morbid obesity, well developed, in no apparent distress.  Resp: No extra work of breathing.  Cardiovascular - Regular rhythm and rate. Abd: soft, round, NTTP, Non-distended  Ext: warm and well perfused  Neuro - Eyes open, awake alert, attempted to speak, dysarthric, not following commands. Able to participate with EOMs: Right gaze preference, but able to cross midline and left gaze incomplete.  Blinking to visual threat bilaterally. Left facial droop.  Left upper extremity 0/5 but increased muscle tone, left LE slight withdraw to pain but also with increased muscle tone.Right upper extremity spontaneous movement against gravity. Right lower extremity at least 3/5 spontaneously. Sensation, coordination and gait not able to be tested.  ASSESSMENT/PLAN Krista Maddox is a 35 y.o. female with history of multiple strokes who presented as a code stroke with symptoms suggestive of LVO. tPA was not given due to Eliquis. CTA head and neck revealed an occlusion of distal right M2 MCA with partial reconstitution and subsequent reocclusion of most M2 branches. Patient underwent thrombectomy.    Recurrent cryptogenic embolic strokes - Acute infarct of the R MCA due to right M1 occlusion s/p IR with TICI2c, cryptogenic etiology   Code Stroke: CT head No acute intracranial hemorrhage. Acute infarcts in the right MCA territory. ASPECTS 8.    CTA head & neck Occlusion of distal right M1 MCA with partial reconstitution and subsequent reocclusion of most M2 branches. Poor collaterals.  CT Perfusion imaging demonstrates core infarction 99 mL and penumbra of 34 mL. The extent of core infarction is calculated at greater than apparent loss of gray-white differentiation on noncontrast head CT. Therefore, penumbra may be greater.  2D Echo LVEF 60-65%, no structural  abnormalities noted that could increase risk for CVA  Repeat CT Head 4/24 - large left MCA infarct, no midline shift. Small SAH sylvian fissure, diminishing in size  Hypercoagulable panel: Homocysteine 15.1 due to smoking, otherwise negative to date  LDL 48  HgbA1c 5.7  VTE prophylaxis - SCDs + Lovenox  aspirin 325 mg daily and Eliquis (apixaban) daily prior to admission, now on ASA 325 and coumadin (pharmacy manged) with INR goal 2.5-3.5.  INR 2.4->2.7->2.5->2.5->2.5->3.2->3.4->3.0->3.5->3.1->2.5  Therapy recommendations:  Rehab   Disposition:  Pending  Hx of cryptogenic  strokes  In 08/2017 patient admitted for left sided numbness and headache.  MRI showed right cerebellum, right temporoparietal small infarcts.  EF 55 to 60%, CT head and neck unremarkable.  LDL 89, A1c 4.8.  TCD bubble study showed trivial PFO.  DVT negative.  TEE no PFO.  Hypercoagulable and autoimmune work-up negative.  Discharged with aspirin and Lipitor.  01/2018 episode of left facial numbness and left leg weakness.  CT head and neck unremarkable.  MRI negative.  06/2018 admitted to [facility] for right hand/face numbness.  MRI showed right tiny parietal infarct.  10/2020 admitted to [facility] for aphasia status post tPA.  CTA neck showed left M2 occlusion.  MRI showed left MCA and punctate right frontal infarct.  TCD bubble study potential trivial PFO, TEE tiny PFO.  11/2020 she was referred to loop recorder which was not done as physician thought she should be candidate for anticoagulation.  Started on Eliquis 5 mg twice daily.  Skin rash with blisters  Left flank skin lesion with blisters, concerning for shingles  Varicella zoster PCR negative, spoke with IC RN and precautions discontinued 5/6.   Off contact and airborne isolation; resolved  Valtrex discontinued   Hyperlipidemia  Home meds:  atrovastatin 80mg  and zetia 10mg    Will restart post CT if stable  LDL 48, goal < 70  Resumed lipitor 80 and zetia 10  Continue statin at discharge   Dysphagia- DYS diet 1  SLP and RD following  Zero intake x 3 day calorie count on nocturnal feedings alone.   S/p PEG    Tube feeds: - Osmolite 1.5 @ 50 ml/hr (1200 ml/day)                           - ProSource TF 90 ml BID                           -Free water flushes of162ml q4hours  dulcolax suppository  Birth control  Recommend OP PCP or OBGYN discontinue Depo - provera due to numerous Contraindications including hx of tobacco use, migraines, and multiple strokes.    Tobacco abuse  Current smoker  Smoking cessation  counseling will be provided  Contraindicated for birth control Depo   Hx of pseudotumor cerebri Chronic migraine  Follows with Dr. 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   Depression  Increase Effexor to 150mg  bid  Other Stroke Risk Factors  Morbid Obesity, Body mass index is 42.12 kg/m., BMI >/= 30 associated with increased stroke risk, recommend weight loss, diet and exercise as appropriate   Obstructive sleep apnea   Hospital day # 35   Lissy Olivencia-Simmons, ACNP-BC Stroke NP 05/07/2021 10:36 AM I have personally obtained history,examined this patient, reviewed notes, independently viewed imaging studies, participated in medical  decision making and plan of care.ROS completed by me personally and pertinent positives fully documented  I have made any additions or clarifications directly to the above note. Agree with note above.  Yet awaiting rehab and skilled nursing setting but she is difficult to place as per social work  Delia Heady, MD Medical Director [facility] Pager: [phone] 05/07/2021 2:59 PM  To contact Stroke Continuity provider, please refer to [email or web address]. After hours, contact General Neurology

## 2021-05-07 NOTE — TOC Progression Note (Addendum)
Transition of Care [facility]) - Progression Note    Patient Details  Name: Krista Maddox MRN: [id number] Date of Birth: [birth year]  [facility] CM/SW Contact  Eduard Roux, [place] Phone Number: 05/07/2021, 2:08 PM  Clinical Narrative:     CSW sent referral to [facility] to review CSW contacted [facility]- requested they review referral  CSW will continue to follow and assist with discharge planning.  Antony Blackbird, MSW, LCSW Clinical Social Worker   Expected Discharge Plan: Skilled Nursing Facility Barriers to Discharge: Continued Medical Work up,Insurance Authorization  Expected Discharge Plan and Services Expected Discharge Plan: Skilled Nursing Facility       Living arrangements for the past 2 months: Single Family Home                                       Social Determinants of Health (SDOH) Interventions    Readmission Risk Interventions No flowsheet data found.

## 2021-05-07 NOTE — Progress Notes (Signed)
  Speech Language Pathology Treatment: Cognitive-Linquistic  Patient Details Name: Krista Maddox MRN: [id number] DOB: 04/17/1986 Today's Date: 05/07/2021 Time: 9417-4081 SLP Time Calculation (min) (ACUTE ONLY): 15 min  Assessment / Plan / Recommendation Clinical Impression  SLP plans to observe pt with a trial tray and determine her willingness to accept po's. Session focused on comprehension and ability to choose lunch items from verbal and visual avenues with therapist. She responded "like what" different foods she can choose from. She did not/would not make a choice from auditory presentation. She looked at menu for several minutes then became distracted by environmental services, her blankets, looked at therapist smiling and nodding head when given choices of want she may want to eat. Therapist chose items and will observe at 12:00 today.    HPI HPI: Pt is a 35 y.o. female with a PMHx of prior strokes x 4 (right cerebellar and right posterior frontal infarctions in 2018 (while smoking and on oral contraceptives), as well as an acute occlusion of her left M2 on 11/09/2020 for which she was treated with thrombectomy at [facility] with sequelae of aphasia and left sided numbness), chronic MHAs, chronic AC with Eliquis, HLD, ? IIH, DDD, ITP during pregnancy, remote tobacco use, and disorder of optic nerve. Patient presented as a code stroke due to right gaze preference, left sided weakness, aphasia and left facial droop. KG:YJEHU infarcts in the right MCA  territory; DJS:HFWYOVZCH of distal right M1 MCA with partial reconstitution and  subsequent reocclusion of most M2 branches. Poor collaterals. Pt s/p revascularization of occluded RT MCA. Cotrak placed 4/22      SLP Plan  Continue with current plan of care       Recommendations  Diet recommendations: NPO Medication Administration: Via alternative means                Oral Care Recommendations: Oral care QID Follow up Recommendations:  Skilled Nursing facility SLP Visit Diagnosis: Cognitive communication deficit (Y85.027) Plan: Continue with current plan of care                      [name] 05/07/2021, 10:02 AM  [name] [name].Ed Nurse, children's [phone] Office [phone]

## 2021-05-07 NOTE — Progress Notes (Signed)
Physical Therapy Treatment Patient Details Name: Krista Maddox MRN: [id number] DOB: [birth year] Today's Date: 05/07/2021    History of Present Illness The pt is a 35 yo female presenting with L sided facial droop and L sided weakness on 4/21. Underwent angiogram and revascularization on 4/21. CT head showing large right MCA territory infarct with 4 mm leftward midline shift. S/p PEG 5/5. 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    PT Comments    Treated pt in conjunction with OT to maximize pt safety and quality of session. Focused session on attempting to activate L lower extremity musculature through partial weight bearing. This was achieved through placing the bed in a chair-like position, scooting pt inferiorly to allow bil feet to be placed flat on end of bed post, and assisting pt's trunk to ascend with use of a bed sheet. Pt demonstrating anxiety with this position through pushing strongly in a pusher-syndrome pattern with her R UE and R leg, thereby resulting in her needing TAx2 to achieve and maintain this position, even when the R UE and R leg were positioned in manners in which they could not push. This indicates she has poor midline awareness. However, pt is making great, steady progress through communicating and following more cues consistently. Will continue to follow acutely. Current recommendations remain appropriate.   Follow Up Recommendations  Other (comment);Supervision/Assistance - 24 hour (Inpatient Rehab with [facility] specialty rehab team)     Equipment Recommendations  Other (comment) (defer to next venue of care)    Recommendations for Other Services       Precautions / Restrictions Precautions Precautions: Fall Precaution Comments: abdominal binder; peg Required Braces or Orthoses: Other Brace Other Brace: L PRAFO Restrictions Weight Bearing Restrictions: No    Mobility  Bed Mobility Overal bed mobility:  Needs Assistance Bed Mobility: Supine to Sit;Sit to Supine;Rolling Rolling: Max assist   Supine to sit: +2 for physical assistance;Total assist (for coming to sit with bed in chair-like position with pt actively resisting with R UE and lower extremity due to anxiety) Sit to supine: Max assist   General bed mobility comments: Bed placed in a chair-like position with plan to try to encourage weight bearing through bil feet with them placed flat on the end of bed post. Elevation of end of bed limiting ability to achieve anterior pelvic tilt and pt having anxiety with transitioning anteriorly, thereby needing TAx2 to ascend trunk to sit with feet placed on post, using a bed sheet around posterior aspect of trunk to assist with pull up to sit. Pt resisting with R UE and lower extremity. x6 reps, altering positions and approaches several times, removing R UE ability to grab at bed or rail to push through having pt hold an object or blocking with the bed sheet 5x, and through eliminating R leg from pushing through placing it on therapist's thigh with foot unsupported 1x. Pt with no noted active pushing with L leg with each rep, but good trunk rotation and activation of R UE and leg to try to avoid this position. Roll 1x either direction with maxA.    Transfers                 General transfer comment: Attempted weight bearing through bil legs and then 1x through only L leg through sitting anteriorly with feet flat on end of bed post, strong activation with attempted lateral placement of R foot but no activation  noted on L this date.  Ambulation/Gait             General Gait Details: unable   Stairs             Wheelchair Mobility    Modified Rankin (Stroke Patients Only) Modified Rankin (Stroke Patients Only) Pre-Morbid Rankin Score: Moderate disability Modified Rankin: Severe disability     Balance Overall balance assessment: Needs assistance Sitting-balance support: Feet  supported;Single extremity supported;No upper extremity supported Sitting balance-Leahy Scale: Poor Sitting balance - Comments: Pt placed in unusual sitting position this date, scooted anteriorly with feet on end of bed post with bed in chair-like position to attempt weight bearing in legs, thus pt anxious and pushing with R UE and leg resulting in TAx2 to maintain this position.     Standing balance-Leahy Scale: Zero Standing balance comment: Unable to stand at this time                            Cognition Arousal/Alertness: Awake/alert Behavior During Therapy: Anxious;Impulsive;Restless (emotional at times) Overall Cognitive Status: Impaired/Different from baseline Area of Impairment: Attention;Following commands;Awareness;Problem solving;Safety/judgement                   Current Attention Level: Sustained (for short bouts)   Following Commands: Follows one step commands inconsistently;Follows one step commands with increased time (multi-modal) Safety/Judgement: Decreased awareness of safety;Decreased awareness of deficits Awareness: Emergent Problem Solving: Slow processing;Decreased initiation;Difficulty sequencing;Requires verbal cues;Requires tactile cues General Comments: Pt verbalizing intermittently to express herself. pt slightly emotional during session and reaching for wash cloth to wipe her eyes. pt following multi-modal one step commands inconsistently with delayed processing but will sustain attention to stimulus for a couple minutes at at time. Pt with poor awareness into midline, demonstrating strong pushing with her R UE and lower extremity when attempting to place weight through feet on end of bed post when bed in chair-like position.      Exercises Other Exercises Other Exercises: Ankle dorsiflexion stretch applied to bil legs through partial weight bearing with feet flat on end of bed post Other Exercises: Attempted L leg muscle activation through  weight bearing partially on end of bed post with bed in chair-like position    General Comments General comments (skin integrity, edema, etc.): Readjusted abdominal binder; redness noted on pt's back      Pertinent Vitals/Pain Pain Assessment: Faces Faces Pain Scale: Hurts little more Pain Location: generalized with trying to assist pt through use of sheet into sitting position Pain Descriptors / Indicators: Discomfort;Grimacing;Guarding Pain Intervention(s): Monitored during session;Limited activity within patient's tolerance;Repositioned    Home Living                      Prior Function            PT Goals (current goals can now be found in the care plan section) Acute Rehab PT Goals Patient Stated Goal: did not state PT Goal Formulation: Patient unable to participate in goal setting Time For Goal Achievement: 05/11/21 Potential to Achieve Goals: Good Progress towards PT goals: Progressing toward goals    Frequency    Min 4X/week      PT Plan Current plan remains appropriate    Co-evaluation PT/OT/SLP Co-Evaluation/Treatment: Yes Reason for Co-Treatment: Complexity of the patient's impairments (multi-system involvement);Necessary to address cognition/behavior during functional activity;For patient/therapist safety;To address functional/ADL transfers PT goals addressed during session: Mobility/safety with mobility;Balance;Strengthening/ROM  AM-PAC PT "6 Clicks" Mobility   Outcome Measure  Help needed turning from your back to your side while in a flat bed without using bedrails?: A Lot Help needed moving from lying on your back to sitting on the side of a flat bed without using bedrails?: A Lot Help needed moving to and from a bed to a chair (including a wheelchair)?: Total Help needed standing up from a chair using your arms (e.g., wheelchair or bedside chair)?: Total Help needed to walk in hospital room?: Total Help needed climbing 3-5 steps  with a railing? : Total 6 Click Score: 8    End of Session   Activity Tolerance: Patient tolerated treatment well Patient left: in bed;with call bell/phone within reach;with bed alarm set   PT Visit Diagnosis: Hemiplegia and hemiparesis;Other abnormalities of gait and mobility (R26.89);Difficulty in walking, not elsewhere classified (R26.2);Other symptoms and signs involving the nervous system (R29.898);Muscle weakness (generalized) (M62.81) Hemiplegia - Right/Left: Left Hemiplegia - dominant/non-dominant: Non-dominant Hemiplegia - caused by: Cerebral infarction     Time: 1310-1350 PT Time Calculation (min) (ACUTE ONLY): 40 min  Charges:  $Neuromuscular Re-education: 23-37 mins                     Raymond Gurney, PT, DPT Acute Rehabilitation Services  Pager: [phone] Office: [phone]    Krista Maddox 05/07/2021, 2:12 PM

## 2021-05-07 NOTE — Progress Notes (Signed)
  Speech Language Pathology Treatment: Dysphagia;Cognitive-Linquistic  Patient Details Name: Krista Maddox MRN: [id number] DOB: May 30, 1986 Today's Date: 05/07/2021 Time: 4097-3532 SLP Time Calculation (min) (ACUTE ONLY): 32 min  Assessment / Plan / Recommendation Clinical Impression  SLP ordered trial tray of cheeseburger, fries, mac and cheese and ice cream to observe pt and her willingness to accept/initiate po's. Her attention waned becoming distracted but easily able to refocus and initiate eating. She needed therapist to place small piece of cheeseburger or spoon in hand and usually took a bite or sip of soda when she was ready. 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 There was no concern with aspiration, residue or oral impairments. SLP has spoken with RN and dietitian re: SLP's recommendation of allowing her to have 1 meal a day presently (lunch likely most appropriate) and staff will order meal- regular texture, thin liquids. She will need assist to set up food and cut into smaller pieces for easier self feeding/manipulation rather difficulty masticating. She also needs assist to initiate feeding due to cognitive impairments and distractibility. Dietitian recommends keeping tube feeding at 100% to ensure nutritional needs are being met until po increases (SLP in agreement). If she begins to eat most of meal, therapist can increase to meals 3x day.    HPI HPI: Pt is a 35 y.o. female with a PMHx of prior strokes x 4 (right cerebellar and right posterior frontal infarctions in 2018 (while smoking and on oral contraceptives), as well as an acute occlusion of her left M2 on 11/09/2020 for which she was treated with thrombectomy at [facility] with sequelae of aphasia and left sided numbness), chronic MHAs, chronic AC with Eliquis, HLD, ? IIH, DDD, ITP during pregnancy, remote tobacco use, and disorder of optic nerve. Patient presented as a code stroke due to right gaze  preference, left sided weakness, aphasia and left facial droop. DJ:MEQAS infarcts in the right MCA  territory; TMH:DQQIWLNLG of distal right M1 MCA with partial reconstitution and  subsequent reocclusion of most M2 branches. Poor collaterals. Pt s/p revascularization of occluded RT MCA. Cotrak placed 4/22      SLP Plan          Recommendations  Diet recommendations: Regular;Thin liquid Liquids provided via: Straw Medication Administration: Via alternative means Supervision: Staff to assist with self feeding;Full supervision/cueing for compensatory strategies (for 1-2 days) Compensations: Minimize environmental distractions Postural Changes and/or Swallow Maneuvers: Seated upright 90 degrees                Oral Care Recommendations: Oral care QID Follow up Recommendations: Skilled Nursing facility SLP Visit Diagnosis: Cognitive communication deficit (R41.841);Dysphagia, unspecified (R13.10)       GO                Houston Siren 05/07/2021, 2:18 PM  Orbie Pyo Colvin Caroli.Ed Risk analyst [phone] Office [phone]

## 2021-05-07 NOTE — Progress Notes (Signed)
ANTICOAGULATION CONSULT NOTE  Pharmacy Consult for warfarin Indication:  Acute stroke  Allergies  Allergen Reactions  . Tape Rash    Cannot tolerate paper tape    Patient Measurements: Height: 5\' 5"  (165.1 cm) Weight: 110.2 kg (242 lb 15.2 oz) IBW/kg (Calculated) : 57  Heparin dosing weight: 72 kg    Vital Signs: Temp: 98.3 F (36.8 C) (05/26 0302) Temp Source: Axillary (05/26 0302) BP: 121/68 (05/26 0302) Pulse Rate: 91 (05/26 0302)  Labs: Recent Labs    05/05/21 0007 05/06/21 0455 05/07/21 0328  HGB  --  11.1*  --   HCT  --  35.5*  --   PLT  --  257  --   LABPROT 32.1*  --  27.3*  INR 3.1*  --  2.5*  CREATININE  --  0.71  --     Estimated Creatinine Clearance: 122.5 mL/min (by C-G formula based on SCr of 0.71 mg/dL).  Assessment: 35 y.o female on apixaban PTA for hx prior strokes, ?DVT. Last dose PTA " unknown," admitted 04/02/21 with acute CVA. No TPA due to apixaban pta.   -4/21 s/p thrombectomy. -Neurologist consulted pharmacist on 04/08/21 to start warfarin with goal INR 2.5-3.5 per Dr. 04/10/21.  INR today is on the lower range of therapeutic at 2.5 (goal of 2.5-3.5).    Attempting to transition to less frequent INR checks (eventually to weekly if able). Still titrating to an appropriate dose so checking sporadically until stable. CBC stable. No bleeding noted. Will increase dose and follow up with INR on Saturday. Given her INR trends she may need a regimen of alternating 7.5mg  / 10mg .  Goal of Therapy:  INR 2.5-3.5 per Neurology Monitor platelets by anticoagulation protocol: Yes   Plan:  - Warfarin 10mg  x1 at 1600 today - Monitor INR every other day, follow for s/sx bleeding  Thank you for allowing pharmacy to be a part of this patient's care.  Tuesday, PharmD PGY1 Acute Care Pharmacy Resident Please refer to [facility] for unit-specific pharmacist

## 2021-05-07 NOTE — Progress Notes (Signed)
Occupational Therapy Treatment Patient Details Name: Krista Maddox MRN: [id number] DOB: 1986-06-09 Today's Date: 05/07/2021    History of present illness The pt is a 35 yo female presenting with L sided facial droop and L sided weakness on 4/21. Underwent angiogram and revascularization on 4/21. CT head showing large right MCA territory infarct with 4 mm leftward midline shift. S/p PEG 5/5. 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   OT comments  Pt seen in conjunction with PT to maximize activity tolerance to facilitate neuromuscular reeducation by WB thru LLE/LUE. Bed positioned in egress position with foot plate donned, positioned sheet behind pts trunk and worked on weight shifts forward to facilitate WB'ing through LLE and LUE, pt needed total A +2 to shift weight, increased pushing noted with RUE//RLE this session. Pt able to brush teeth with max multimodal cues this session! Pt would continue to benefit from skilled occupational therapy while admitted and after d/c to address the below listed limitations in order to improve overall functional mobility and facilitate independence with BADL participation. DC plan remains appropriate, will follow acutely per POC.    Follow Up Recommendations  Other (comment) (intensive inpatient rehab setting ( [facility] team))    Equipment Recommendations  3 in 1 bedside commode    Recommendations for Other Services      Precautions / Restrictions Precautions Precautions: Fall Precaution Comments: abdominal binder; peg Required Braces or Orthoses: Other Brace Other Brace: L PRAFO; L resting hand splint for night time Restrictions Weight Bearing Restrictions: No       Mobility Bed Mobility Overal bed mobility: Needs Assistance Bed Mobility: Supine to Sit;Sit to Supine;Rolling Rolling: Max assist   Supine to sit: +2 for physical assistance;Total assist (for coming to sit with bed in chair-like  position with pt actively resisting with R UE and lower extremity due to anxiety) Sit to supine: Max assist   General bed mobility comments: Bed placed in a chair-like position with plan to try to encourage weight bearing through bil feet with them placed flat on the end of bed post. Elevation of end of bed limiting ability to achieve anterior pelvic tilt and pt having anxiety with transitioning anteriorly, thereby needing TAx2 to ascend trunk to sit with feet placed on post, using a bed sheet around posterior aspect of trunk to assist with pull up to sit. Pt resisting with R UE and lower extremity. x6 reps, altering positions and approaches several times, removing R UE ability to grab at bed or rail to push through having pt hold an object or blocking with the bed sheet 5x, and through eliminating R leg from pushing through placing it on therapist's thigh with foot unsupported 1x. Pt with no noted active pushing with L leg with each rep, but good trunk rotation and activation of R UE and leg to try to avoid this position. Roll 1x either direction with maxA.    Transfers                 General transfer comment: Attempted weight bearing through bil legs and then 1x through only L leg through sitting anteriorly with feet flat on end of bed post, strong activation with attempted lateral placement of R foot but no activation noted on L this date.    Balance Overall balance assessment: Needs assistance Sitting-balance support: Feet supported;Single extremity supported;No upper extremity supported Sitting balance-Leahy Scale: Poor Sitting balance - Comments: Pt placed in unusual  sitting position this date, scooted anteriorly with feet on end of bed post with bed in chair-like position to attempt weight bearing in legs, thus pt anxious and pushing with R UE and leg resulting in TAx2 to maintain this position.     Standing balance-Leahy Scale: Zero Standing balance comment: Unable to stand at this  time                           ADL either performed or assessed with clinical judgement   ADL Overall ADL's : Needs assistance/impaired     Grooming: Oral care;Sitting;Supervision/safety;Set up;Cueing for sequencing;Cueing for safety;Wash/dry face Grooming Details (indicate cue type and reason): pt able to brush her teeth with MAX multimodal cues to attend to task, pt initially only keeping toothbrush outside of mouth but with increased time pt able to actually bring it into her mouth, pt also appropriatley wiping her face as needed during grooming tasks                             Functional mobility during ADLs: Maximal assistance;+2 for physical assistance;Total assistance (bed mobility only) General ADL Comments: session focus on positoning bed in egress position to faciliate WB thru LLE and BADL reeducation     Vision   Additional Comments: pt continues to be able to track to L with stimulus and sustain attention ~ 4 mins at a time   Perception     Praxis      Cognition Arousal/Alertness: Awake/alert Behavior During Therapy: Anxious;Impulsive;Restless (emotional at times) Overall Cognitive Status: Impaired/Different from baseline Area of Impairment: Attention;Following commands;Awareness;Problem solving;Safety/judgement                   Current Attention Level: Sustained (for short bouts)   Following Commands: Follows one step commands inconsistently;Follows one step commands with increased time (mulitmodal) Safety/Judgement: Decreased awareness of safety;Decreased awareness of deficits Awareness: Emergent Problem Solving: Slow processing;Decreased initiation;Difficulty sequencing;Requires verbal cues;Requires tactile cues General Comments: pt continues to present with sustained attention to stimuli for short bouts, pt continues to say short phrases attempting to express herself, noted to wave goodbye and smile and laugh. pt with poor awareness  to midline noted to push more with RUE in comparision to previous sessions. pt appropriately wiping at her face and able to brush her teeth with increased time and max multimodal cues        Exercises Exercises: Other exercises General Exercises - Upper Extremity Elbow Flexion: PROM;Left;10 reps;Supine Elbow Extension: Left;PROM;10 reps;Supine Wrist Flexion: PROM;Left;5 reps;Supine Wrist Extension: Left;PROM;5 reps;Supine Other Exercises Other Exercises: Ankle dorsiflexion stretch applied to bil legs through partial weight bearing with feet flat on end of bed post Other Exercises: Attempted L leg muscle activation through weight bearing partially on end of bed post with bed in chair-like position   Shoulder Instructions       General Comments Readjusted abdominal binder; redness noted on pt's back    Pertinent Vitals/ Pain       Pain Assessment: Faces Faces Pain Scale: Hurts little more Pain Location: generalized with trying to assist pt through use of sheet into sitting position Pain Descriptors / Indicators: Discomfort;Grimacing;Guarding Pain Intervention(s): Limited activity within patient's tolerance;Monitored during session;Repositioned  Home Living  Prior Functioning/Environment              Frequency  Min 3X/week        Progress Toward Goals  OT Goals(current goals can now be found in the care plan section)  Progress towards OT goals: Progressing toward goals  Acute Rehab OT Goals Patient Stated Goal: none stated Time For Goal Achievement: 05/15/21 Potential to Achieve Goals: Fair  Plan Discharge plan remains appropriate;Frequency remains appropriate    Co-evaluation      Reason for Co-Treatment: Complexity of the patient's impairments (multi-system involvement);For patient/therapist safety;To address functional/ADL transfers;Necessary to address cognition/behavior during functional activity PT  goals addressed during session: Mobility/safety with mobility;Balance;Strengthening/ROM OT goals addressed during session: ADL's and self-care;Strengthening/ROM      AM-PAC OT "6 Clicks" Daily Activity     Outcome Measure   Help from another person eating meals?: Total Help from another person taking care of personal grooming?: A Lot Help from another person toileting, which includes using toliet, bedpan, or urinal?: Total Help from another person bathing (including washing, rinsing, drying)?: Total Help from another person to put on and taking off regular upper body clothing?: A Lot Help from another person to put on and taking off regular lower body clothing?: Total 6 Click Score: 8    End of Session    OT Visit Diagnosis: Unsteadiness on feet (R26.81);Other abnormalities of gait and mobility (R26.89);Muscle weakness (generalized) (M62.81);Low vision, both eyes (H54.2);Apraxia (R48.2);Other symptoms and signs involving cognitive function;Hemiplegia and hemiparesis Symptoms and signs involving cognitive functions: Cerebral infarction Hemiplegia - Right/Left: Left Hemiplegia - dominant/non-dominant: Non-Dominant Hemiplegia - caused by: Cerebral infarction   Activity Tolerance Patient tolerated treatment well   Patient Left in bed;with call bell/phone within reach;with bed alarm set   Nurse Communication Mobility status        Time: 8119-1478 OT Time Calculation (min): 38 min  Charges: OT General Charges $OT Visit: 1 Visit OT Treatments $Therapeutic Activity: 8-22 mins  Lenor Derrick., COTA/L Acute Rehabilitation Services [phone]    Barron Schmid 05/07/2021, 2:29 PM

## 2021-05-07 NOTE — Progress Notes (Signed)
Received call from Tammy at [facility] rehab, she continues to follow pt's progress with therapies. Would like to see how pt continues to do through the weekend with therapies and follow back up next week Tues/Wed. If patient continues to show some progress with therapies, will consider a possible admit pending insurance approval and bed availability.

## 2021-05-08 DIAGNOSIS — I63511 Cerebral infarction due to unspecified occlusion or stenosis of right middle cerebral artery: Secondary | ICD-10-CM | POA: Diagnosis not present

## 2021-05-08 LAB — GLUCOSE, CAPILLARY
Glucose-Capillary: 98 mg/dL (ref 70–99)
Glucose-Capillary: 99 mg/dL (ref 70–99)
Glucose-Capillary: 103 mg/dL — ABNORMAL HIGH (ref 70–99)
Glucose-Capillary: 107 mg/dL — ABNORMAL HIGH (ref 70–99)
Glucose-Capillary: 92 mg/dL (ref 70–99)
Glucose-Capillary: 96 mg/dL (ref 70–99)

## 2021-05-08 MED ORDER — WARFARIN SODIUM 5 MG PO TABS
10.0000 mg | ORAL_TABLET | Freq: Once | ORAL | Status: AC
  Administered 2021-05-08: 10 mg via ORAL
  Filled 2021-05-08: qty 2

## 2021-05-08 NOTE — TOC Progression Note (Signed)
Transition of Care [facility]) - Progression Note    Patient Details  Name: Krista Maddox MRN: [id number] Date of Birth: 12/05/1986  [facility] CM/SW Contact  Eduard Roux, [place] Phone Number: 05/08/2021, 11:58 AM  Clinical Narrative:     CSW contacted [facility]- requested to review clinicals and they confirm if they are able to accept patient. CSW was informed they will not accept LOG. She will review and call CSW back today.  CSW will continue to follow and assist with discharge planning.   Antony Blackbird, MSW, LCSW Clinical Social Worker    Expected Discharge Plan: Skilled Nursing Facility Barriers to Discharge: Continued Medical Work up,Insurance Authorization  Expected Discharge Plan and Services Expected Discharge Plan: Skilled Nursing Facility       Living arrangements for the past 2 months: Single Family Home                                       Social Determinants of Health (SDOH) Interventions    Readmission Risk Interventions No flowsheet data found.

## 2021-05-08 NOTE — Progress Notes (Signed)
STROKE TEAM PROGRESS NOTE   INTERVAL HISTORY No acute events.  Afebrile.  VSS.  She is intermittently smiling but does get frustrated when she is tries to speak she is very dysarthric and her voice appears stronger. No other change in neuro exam.   Awaiting inpatient rehab with [facility] specialty rehab team.   INR 2.5 yesterday. Coumadin dose being managed by pharmacy.  Checking INR every other day.    Vitals:   05/08/21 0323 05/08/21 0336 05/08/21 0755 05/08/21 1510  BP: 124/79  106/73 113/76  Pulse: 88  (!) 102 (!) 108  Resp: 17  18 16   Temp: 99.7 F (37.6 C)  99.2 F (37.3 C) 99.4 F (37.4 C)  TempSrc: Axillary  Axillary Oral  SpO2: 98%  91% 99%  Weight:  111 kg    Height:       CBC:  Recent Labs  Lab 05/04/21 0629 05/06/21 0455  WBC 8.1 8.0  HGB 11.2* 11.1*  HCT 36.1 35.5*  MCV 91.4 92.2  PLT 245 257   Basic Metabolic Panel:  Recent Labs  Lab 05/04/21 0629 05/06/21 0455  NA 138 138  K 3.5 3.5  CL 107 106  CO2 23 25  GLUCOSE 105* 93  BUN 16 18  CREATININE 0.64 0.71  CALCIUM 8.8* 8.9   IMAGING past 24 hours No results found.  PHYSICAL EXAM  Temp:  [98 F (36.7 C)-99.7 F (37.6 C)] 99.4 F (37.4 C) (05/27 1510) Pulse Rate:  [88-108] 108 (05/27 1510) Resp:  [16-20] 16 (05/27 1510) BP: (106-124)/(55-81) 113/76 (05/27 1510) SpO2:  [91 %-99 %] 99 % (05/27 1510) Weight:  04-27-1976 kg] 111 kg (05/27 0336)  General - morbid obesity, well developed, in no apparent distress.  Resp: No extra work of breathing.  Cardiovascular - Regular rhythm and rate. Abd: soft, round, NTTP, Non-distended  Ext: warm and well perfused  Neuro - Eyes open, awake alert, attempted to speak, dysarthric, not following commands. Able to participate with EOMs: Right gaze preference, but able to cross midline and left gaze incomplete.  Blinking to visual threat bilaterally. Left facial droop. Left upper extremity 0/5 but increased muscle tone, left LE slight withdraw to pain but also  with increased muscle tone.Right upper extremity spontaneous movement against gravity. Right lower extremity at least 3/5 spontaneously. Sensation, coordination and gait not able to be tested.  ASSESSMENT/PLAN Krista Maddox is a 35 y.o. female with history of multiple strokes who presented as a code stroke with symptoms suggestive of LVO. tPA was not given due to Eliquis. CTA head and neck revealed an occlusion of distal right M2 MCA with partial reconstitution and subsequent reocclusion of most M2 branches. Patient underwent thrombectomy.    Recurrent cryptogenic embolic strokes - Acute infarct of the R MCA due to right M1 occlusion s/p IR with TICI2c, cryptogenic etiology   Code Stroke: CT head No acute intracranial hemorrhage. Acute infarcts in the right MCA territory. ASPECTS 8.    CTA head & neck Occlusion of distal right M1 MCA with partial reconstitution and subsequent reocclusion of most M2 branches. Poor collaterals.  CT Perfusion imaging demonstrates core infarction 99 mL and penumbra of 34 mL. The extent of core infarction is calculated at greater than apparent loss of gray-white differentiation on noncontrast head CT. Therefore, penumbra may be greater.  2D Echo LVEF 60-65%, no structural abnormalities noted that could increase risk for CVA  Repeat CT Head 4/24 - large left MCA infarct, no midline  shift. Small SAH sylvian fissure, diminishing in size  Hypercoagulable panel: Homocysteine 15.1 due to smoking, otherwise negative to date  LDL 48  HgbA1c 5.7  VTE prophylaxis - SCDs + Lovenox  aspirin 325 mg daily and Eliquis (apixaban) daily prior to admission, now on ASA 325 and coumadin (pharmacy manged) with INR goal 2.5-3.5.  INR 2.4->2.7->2.5->2.5->2.5->3.2->3.4->3.0->3.5->3.1->2.5  Therapy recommendations:  Rehab   Disposition:  Pending  Hx of cryptogenic strokes  In 08/2017 patient admitted for left sided numbness and headache.  MRI showed right cerebellum,  right temporoparietal small infarcts.  EF 55 to 60%, CT head and neck unremarkable.  LDL 89, A1c 4.8.  TCD bubble study showed trivial PFO.  DVT negative.  TEE no PFO.  Hypercoagulable and autoimmune work-up negative.  Discharged with aspirin and Lipitor.  01/2018 episode of left facial numbness and left leg weakness.  CT head and neck unremarkable.  MRI negative.  06/2018 admitted to [facility] for right hand/face numbness.  MRI showed right tiny parietal infarct.  10/2020 admitted to [facility] for aphasia status post tPA.  CTA neck showed left M2 occlusion.  MRI showed left MCA and punctate right frontal infarct.  TCD bubble study potential trivial PFO, TEE tiny PFO.  11/2020 she was referred to loop recorder which was not done as physician thought she should be candidate for anticoagulation.  Started on Eliquis 5 mg twice daily.  Skin rash with blisters  Left flank skin lesion with blisters, concerning for shingles  Varicella zoster PCR negative, spoke with IC RN and precautions discontinued 5/6.   Off contact and airborne isolation; resolved  Valtrex discontinued   Hyperlipidemia  Home meds:  atrovastatin 80mg  and zetia 10mg    Will restart post CT if stable  LDL 48, goal < 70  Resumed lipitor 80 and zetia 10  Continue statin at discharge   Dysphagia- DYS diet 1  SLP and RD following  Zero intake x 3 day calorie count on nocturnal feedings alone.   S/p PEG    Tube feeds: - Osmolite 1.5 @ 50 ml/hr (1200 ml/day)                           - ProSource TF 90 ml BID                           -Free water flushes of167ml q4hours  dulcolax suppository  Birth control  Recommend OP PCP or OBGYN discontinue Depo - provera due to numerous Contraindications including hx of tobacco use, migraines, and multiple strokes.    Tobacco abuse  Current smoker  Smoking cessation counseling will be provided  Contraindicated for birth control Depo   Hx of pseudotumor cerebri Chronic  migraine  Follows with Dr. 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   Depression  Increase Effexor to 150mg  bid  Other Stroke Risk Factors  Morbid Obesity, Body mass index is 42.12 kg/m., BMI >/= 30 associated with increased stroke risk, recommend weight loss, diet and exercise as appropriate   Obstructive sleep apnea   Hospital day # 36   Continue ongoing management.  Yet awaiting rehab and skilled nursing setting but she is difficult to place as per social work  [facility], MD Medical Director Stroke Center Pager: [phone] 05/08/2021 4:40 PM  To contact Stroke Continuity provider, please refer  to [email or web address] After hours, contact General Neurology

## 2021-05-08 NOTE — Progress Notes (Addendum)
Physical Therapy Treatment Patient Details Name: Krista Maddox MRN: [id number] DOB: [birth year] Today's Date: 05/08/2021    History of Present Illness The pt is a 35 yo female presenting with L sided facial droop and L sided weakness on 4/21. Underwent angiogram and revascularization on 4/21. CT head showing large right MCA territory infarct with 4 mm leftward midline shift. S/p PEG 5/5. 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    PT Comments    Pt is making significant progress towards her goals. Pt performed x3 sit <> stand transfers, only needing modAx2 with HHA the first rep and to also take 1 step. However, pt became more anxious when attempting to utilize the stedy, resulting in her pushing strongly with her R UE and thus pt needing more assistance to come to stand the final 2 reps. Pt is demonstrating improved spatial awareness and awareness of her deficits and safety, expressing her anxiety with the final 2 transfers by stating "Am I going to fall?, "I'm falling!", and "Help me!" followed by pt giving a thumbs up immediately back to the therapist after the therapist gave a thumbs up encouraging the pt she did well. The NT has also reported that the pt has been using the call bell to notify nursing when she has issues, like a bowel movement, on occasion. Pt also progressing in only needing modAx1 physically to transition supine > sit EOB. She continues to neglect her L side, but will attend to it with stimulation and getting her to look at it. She is beginning to demonstrate more activation of her L leg, but noted possible extension synergy pattern. Will continue to follow acutely. Current recommendations remain appropriate.    Follow Up Recommendations  Other (comment);Supervision/Assistance - 24 hour (Inpatient Rehab with [facility] specialty rehab team)     Equipment Recommendations  Other (comment) (defer to next venue of care)     Recommendations for Other Services       Precautions / Restrictions Precautions Precautions: Fall Precaution Comments: abdominal binder; peg Required Braces or Orthoses: Other Brace Other Brace: L PRAFO Restrictions Weight Bearing Restrictions: No    Mobility  Bed Mobility Overal bed mobility: Needs Assistance Bed Mobility: Supine to Sit;Sit to Supine;Rolling Rolling: Max assist   Supine to sit: Mod assist;+2 for safety/equipment (+2 for safety) Sit to supine: Max assist;+2 for physical assistance   General bed mobility comments: Pt initially trying to exit R side of bed when provided multi-modal cues to sit up, needing redirection but once redirected pt initiated movement. Only needed modAx1 physically to elevate trunk and manage legs to EOB, +2 for safety.    Transfers Overall transfer level: Needs assistance Equipment used: 2 person hand held assist;Ambulation equipment used Transfers: Sit to/from Stand Sit to Stand: Mod assist;+2 physical assistance;+2 safety/equipment         General transfer comment: Sit <> stand 3x this date. 1st bout performed with therapists on either side anterior to her with knees blocked, cuing pt to place R hand on bed rail or therapist to push up, modAx2 to power up and extend hips. 2nd bout attempted to use the stedy, but pt became anxious and began to push from bed rail or rail on stedy with R hand, resulting in her needing maxAx2 to come to stand and then sit on stedy flaps. 3rd rep stood from stedy flaps again with maxAx2 due to pt anxiety causing her to push. Of note, pt  positioned L foot proximal to R in stedy, needing physical correction.  Ambulation/Gait Ambulation/Gait assistance: Mod assist;+2 physical assistance   Assistive device: 2 person hand held assist       General Gait Details: x1 step with R foot with modAx2 during 1st standing bout.   Stairs             Wheelchair Mobility    Modified Rankin (Stroke Patients  Only) Modified Rankin (Stroke Patients Only) Pre-Morbid Rankin Score: Moderate disability Modified Rankin: Severe disability     Balance Overall balance assessment: Needs assistance Sitting-balance support: Feet supported;Single extremity supported Sitting balance-Leahy Scale: Fair Sitting balance - Comments: sitting EOB during session with close supervision with moments of no UE support and moments of RUE supported on end of bed.   Standing balance support: Single extremity supported Standing balance-Leahy Scale: Poor Standing balance comment: UE support and mod-maxAx2 depending on pt's level of anxiety and resulting pushing syndrome.                            Cognition Arousal/Alertness: Awake/alert Behavior During Therapy: Anxious;Impulsive;Restless Overall Cognitive Status: Impaired/Different from baseline Area of Impairment: Attention;Following commands;Awareness;Problem solving;Safety/judgement                   Current Attention Level: Sustained (for short bouts)   Following Commands: Follows one step commands inconsistently;Follows one step commands with increased time (multi-modal) Safety/Judgement: Decreased awareness of safety;Decreased awareness of deficits Awareness: Emergent Problem Solving: Slow processing;Decreased initiation;Difficulty sequencing;Requires verbal cues;Requires tactile cues General Comments: Pt with improved recognition of her deficits, spatial awareness, and safety, asking "Am I going to fall?" and stating "I'm going to fall", "help me!", and "it hurts" when pt became anxious with standing this date. Pt also stated "where is my blanket?". NT reports pt has figured out how to manage her buttons to call for nursing and to adjust her bed (although gets herself stuck in odd positions), and express to nursing issues like having bowel movement when she does call them. pt following one step commands inconsistently but will sustain attention  to stimulus a few mins at at time when applying stimulus. Poor midline awareness in more complex positions, with her maintaining it in sitting fairly well but begins to push again once standing or scooted anteriorly to edge. Pt looking around her room, still needing cues at times to track and find objects or people to her L.      Exercises      General Comments General comments (skin integrity, edema, etc.): Pt moves L leg in extension synergy patterns, but noted hip and knee flexion muscle activation intermittently during session; current abdominal binder is too small, rolling up and causing constrictina dn skin irritation at her abdomen, RN notified for request for larger binder      Pertinent Vitals/Pain Pain Assessment: Faces Faces Pain Scale: Hurts little more Pain Location: abdomen Pain Descriptors / Indicators: Discomfort;Grimacing;Guarding Pain Intervention(s): Limited activity within patient's tolerance;Monitored during session;Repositioned    Home Living                      Prior Function            PT Goals (current goals can now be found in the care plan section) Acute Rehab PT Goals Patient Stated Goal: to not fall PT Goal Formulation: Patient unable to participate in goal setting Time For Goal Achievement: 05/11/21 Potential to Achieve  Goals: Good Progress towards PT goals: Progressing toward goals    Frequency    Min 4X/week      PT Plan Current plan remains appropriate    Co-evaluation PT/OT/SLP Co-Evaluation/Treatment: Yes Reason for Co-Treatment: Complexity of the patient's impairments (multi-system involvement);Necessary to address cognition/behavior during functional activity;For patient/therapist safety;To address functional/ADL transfers PT goals addressed during session: Mobility/safety with mobility;Balance;Proper use of DME        AM-PAC PT "6 Clicks" Mobility   Outcome Measure  Help needed turning from your back to your side  while in a flat bed without using bedrails?: A Lot Help needed moving from lying on your back to sitting on the side of a flat bed without using bedrails?: A Lot Help needed moving to and from a bed to a chair (including a wheelchair)?: Total Help needed standing up from a chair using your arms (e.g., wheelchair or bedside chair)?: Total Help needed to walk in hospital room?: Total Help needed climbing 3-5 steps with a railing? : Total 6 Click Score: 8    End of Session Equipment Utilized During Treatment: Gait belt Activity Tolerance: Patient tolerated treatment well Patient left: in bed;with call bell/phone within reach;with bed alarm set;with SCD's reapplied;Other (comment) (with L PRAFO donned) Nurse Communication: Mobility status;Need for lift equipment;Other (comment) (need for larger abdominal binder) PT Visit Diagnosis: Hemiplegia and hemiparesis;Other abnormalities of gait and mobility (R26.89);Difficulty in walking, not elsewhere classified (R26.2);Other symptoms and signs involving the nervous system (R29.898);Muscle weakness (generalized) (M62.81) Hemiplegia - Right/Left: Left Hemiplegia - dominant/non-dominant: Non-dominant Hemiplegia - caused by: Cerebral infarction     Time: 4163-8453 PT Time Calculation (min) (ACUTE ONLY): 47 min  Charges:  $Therapeutic Activity: 23-37 mins                     Raymond Gurney, PT, DPT Acute Rehabilitation Services  Pager: [phone] Office: [phone]    Jewel Baize 05/08/2021, 1:44 PM

## 2021-05-08 NOTE — Progress Notes (Signed)
ANTICOAGULATION CONSULT NOTE  Pharmacy Consult for warfarin Indication:  Acute stroke  Allergies  Allergen Reactions  . Tape Rash    Cannot tolerate paper tape    Patient Measurements: Height: 5\' 5"  (165.1 cm) Weight: 111 kg (244 lb 11.4 oz) IBW/kg (Calculated) : 57   Vital Signs: Temp: 99.2 F (37.3 C) (05/27 0755) Temp Source: Axillary (05/27 0755) BP: 106/73 (05/27 0755) Pulse Rate: 102 (05/27 0755)  Labs: Recent Labs    05/06/21 0455 05/07/21 0328  HGB 11.1*  --   HCT 35.5*  --   PLT 257  --   LABPROT  --  27.3*  INR  --  2.5*  CREATININE 0.71  --     Estimated Creatinine Clearance: 122.9 mL/min (by C-G formula based on SCr of 0.71 mg/dL).  Assessment: 35 y.o female on apixaban PTA for hx prior strokes, ?DVT. Last dose PTA " unknown," admitted 04/02/21 with acute CVA. No TPA due to apixaban pta.  Underwent thrombectomy on 5/21.  Pharmacy consulted for Coumadin dosing.  INR therapeutic 2.5 on 5/26; no INR today.  No bleeding reported.  Goal of Therapy:  INR 2.5-3.5 per Neurology Monitor platelets by anticoagulation protocol: Yes   Plan:  Repeat Coumadin 10mg  PO today F/U INR in AM and consider starting maintenance regiment if therapeutic/stable  Shaquille Janes D. , PharmD, BCPS, BCCCP 05/08/2021, 2:06 PM

## 2021-05-08 NOTE — Progress Notes (Signed)
  Speech Language Pathology Treatment: Dysphagia;Cognitive-Linquistic  Patient Details Name: Krista Maddox MRN: [id number] DOB: 1986/11/29 Today's Date: 05/08/2021 Time: 5003-7048 SLP Time Calculation (min) (ACUTE ONLY): 31 min  Assessment / Plan / Recommendation Clinical Impression  Communication and dysphagia treatment with lunch tray. Pt was encountered restless in bed, crying and attempting to express her thoughts. Verbalized several phrases with phonemic paraphasias mixed with intelligible words "mom" "call". Therapist provided support and gently diverted attention to eating/drinking (focused on eating versus expression on phone with mom). Initiated self feeding after chicken tender placed in hand approximately 40% of the time. She then initiated eating french fry intermittently and drinking tea consuming approximately 5-6 bites and 2-3 oz tea without s/s aspiration. Krista Maddox is frequently distracted by surroundings and needs to be redirected throughout session. Many times she does not respond to therapists request for verbal or motor response and does not make eye contact or will look at therapist and smile. Providing imitation, automatic output, singing has not elicited response.   Pt is allowed to have more po's than at lunch if she desires something to drink or eat with full supervision (floor stock).   HPI HPI: Pt is a 35 y.o. female with a PMHx of prior strokes x 4 (right cerebellar and right posterior frontal infarctions in 2018 (while smoking and on oral contraceptives), as well as an acute occlusion of her left M2 on 11/09/2020 for which she was treated with thrombectomy at [facility] with sequelae of aphasia and left sided numbness), chronic MHAs, chronic AC with Eliquis, HLD, ? IIH, DDD, ITP during pregnancy, remote tobacco use, and disorder of optic nerve. Patient presented as a code stroke due to right gaze preference, left sided weakness, aphasia and left facial droop. GQ:BVQXI  infarcts in the right MCA  territory; HWT:[id number] of distal right M1 MCA with partial reconstitution and  subsequent reocclusion of most M2 branches. Poor collaterals. Pt s/p revascularization of occluded RT MCA. Cotrak placed 4/22      SLP Plan  Continue with current plan of care       Recommendations  Diet recommendations: Regular;Thin liquid Liquids provided via: Cup;Straw Medication Administration: Via alternative means Supervision: Staff to assist with self feeding;Full supervision/cueing for compensatory strategies Compensations: Minimize environmental distractions;Slow rate;Small sips/bites Postural Changes and/or Swallow Maneuvers: Seated upright 90 degrees                Oral Care Recommendations: Oral care QID Follow up Recommendations: Skilled Nursing facility SLP Visit Diagnosis: Cognitive communication deficit (R41.841);Dysphagia, unspecified (R13.10) Plan: Continue with current plan of care                      [name] 05/08/2021, 1:56 PM  [name] [name].Ed Nurse, children's [phone] Office [phone]

## 2021-05-08 NOTE — TOC Progression Note (Addendum)
Transition of Care [facility]) - Progression Note    Patient Details  Name: Krista Maddox MRN: [id number] Date of Birth: November 15, 1986  [facility] CM/SW Contact  Eduard Roux, [place] Phone Number: 05/08/2021, 12:27 PM  Clinical Narrative:     CSW spoke with patient's father,[name]- CSW updated on placement and expansion of search. He expressed the plan would be for the patient to return home after rehab. He states patient friend Babette Relic is in the home w/patient but she works. CSW encourage patient's father's to begin to explore, if the plan for the patient to return home, what support would need to be in place and advised insurance does not cover sitters,etc. He states he understands.  CSW spoke with patient's friend,Tammy(ex-mother in law), she confirmed the patient lives in the home with her. CSW inquired about plans after rehab. She states she is unable to provide 24/hr supervision or the level of care patient may need at this time. She shared her home is not handicap accessible. She states she anticipate having a discussion with patient's father.   CSW will continue to follow and assist with discharge planning.   Antony Blackbird, MSW, LCSW Clinical Social Worker    Expected Discharge Plan: Skilled Nursing Facility Barriers to Discharge: Continued Medical Work up,Insurance Authorization  Expected Discharge Plan and Services Expected Discharge Plan: Skilled Nursing Facility       Living arrangements for the past 2 months: Single Family Home                                       Social Determinants of Health (SDOH) Interventions    Readmission Risk Interventions No flowsheet data found.

## 2021-05-08 NOTE — Progress Notes (Signed)
Occupational Therapy Treatment Patient Details Name: Krista Maddox MRN: [id number] DOB: 01/05/1986 Today's Date: 05/08/2021    History of present illness The pt is a 35 yo female presenting with L sided facial droop and L sided weakness on 4/21. Underwent angiogram and revascularization on 4/21. CT head showing large right MCA territory infarct with 4 mm leftward midline shift. S/p PEG 5/5. 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   OT comments  Pt seen in conjunction with PT.  She continues to demonstrate consistent progress.  She was able to move to EOB with mod A with second person for safety and was able to maintain static sitting EOB with min guard assist.  She was able to comb hair with max A/hand over hand assist.  She stood x 2 with mod A +2 the first attempt and max A +2 second attempts.  Attempted to use stedy for transfer, however, pt became very fearful asking if she was going to fall, and she was subsequently unable to complete the transfer due to increased anxiety/fear which resulted in demonstrating an increase in pushing postures.  Continue to recommend CIR level rehab as pt would greatly benefit from the increased intensity, consistency and structure of a formal rehab program as well as a rehab team that specializes in stroke  Follow Up Recommendations  CIR    Equipment Recommendations  3 in 1 bedside commode    Recommendations for Other Services      Precautions / Restrictions Precautions Precautions: Fall Precaution Comments: abdominal binder; peg Required Braces or Orthoses: Other Brace Other Brace: L PRAFO Restrictions Weight Bearing Restrictions: No       Mobility Bed Mobility Overal bed mobility: Needs Assistance Bed Mobility: Supine to Sit;Sit to Supine;Rolling Rolling: Max assist   Supine to sit: Mod assist;+2 for safety/equipment (+2 for safety) Sit to supine: Max assist;+2 for physical assistance    General bed mobility comments: Pt initially trying to exit R side of bed when provided multi-modal cues to sit up, needing redirection but once redirected pt initiated movement. Only needed modAx1 physically to elevate trunk and manage legs to EOB, +2 for safety.    Transfers Overall transfer level: Needs assistance Equipment used: 2 person hand held assist;Ambulation equipment used Transfers: Sit to/from Stand Sit to Stand: Mod assist;+2 physical assistance;+2 safety/equipment         General transfer comment: Sit <> stand 3x this date. 1st bout performed with therapists on either side anterior to her with knees blocked, cuing pt to place R hand on bed rail or therapist to push up, modAx2 to power up and extend hips. 2nd bout attempted to use the stedy, but pt became anxious and began to push from bed rail or rail on stedy with R hand, resulting in her needing maxAx2 to come to stand and then sit on stedy flaps. 3rd rep stood from stedy flaps again with maxAx2 due to pt anxiety causing her to push. Of note, pt positioned L foot proximal to R in stedy, needing physical correction.    Balance Overall balance assessment: Needs assistance Sitting-balance support: Feet supported;Single extremity supported Sitting balance-Leahy Scale: Fair Sitting balance - Comments: sitting EOB during session with close supervision with moments of no UE support and moments of RUE supported on end of bed.   Standing balance support: Single extremity supported Standing balance-Leahy Scale: Poor Standing balance comment: UE support and mod-maxAx2 depending on pt's level of anxiety  and resulting pushing syndrome.                           ADL either performed or assessed with clinical judgement   ADL Overall ADL's : Needs assistance/impaired Eating/Feeding: Bed level;NPO   Grooming: Brushing hair;Maximal assistance;Sitting Grooming Details (indicate cue type and reason): max A hand over hand  assist provided                             Functional mobility during ADLs: Maximal assistance;+2 for physical assistance;+2 for safety/equipment       Vision       Perception     Praxis      Cognition Arousal/Alertness: Awake/alert Behavior During Therapy: Anxious;Impulsive;Restless Overall Cognitive Status: Impaired/Different from baseline Area of Impairment: Attention;Following commands;Awareness;Problem solving;Safety/judgement                   Current Attention Level: Sustained;Focused (for short bouts)   Following Commands: Follows one step commands inconsistently;Follows one step commands with increased time (multi-modal) Safety/Judgement: Decreased awareness of safety;Decreased awareness of deficits Awareness: Emergent Problem Solving: Slow processing;Decreased initiation;Difficulty sequencing;Requires verbal cues;Requires tactile cues General Comments: Pt smiling today.  she initially required mod cues to attend to Lt initially.  She demonstrates focused attention with short bouts of sustained attention.  She does not actively engage with grooming objects.  She initially just looked at the comb, but then started playing with her hair - unsure if that was action was in correlation to the comb or random act.  She followed occasional simple commands with multi modal cuing and increased time.  after attempting to stand from the [facility], and once she was returned to bed, therapist gave her a thumb's up and encouraged her, with pt responding with thumbs' down and a grimmace.  After first attempt at standing, pt spontaneously looked around her room and asked "where's my blanket?"   On second attempt at standing with the Corene Cornea pt stated "am I going to fall"... "I am going to fall!", and "help me"        Exercises     Shoulder Instructions       General Comments Pt moves L leg in extension synergy patterns, but noted hip and knee flexion muscle  activation intermittently during session; current abdominal binder is too small, rolling up and causing constrictina dn skin irritation at her abdomen, RN notified for request for larger binder    Pertinent Vitals/ Pain       Pain Assessment: Faces Faces Pain Scale: Hurts little more Pain Location: abdomen Pain Descriptors / Indicators: Discomfort;Grimacing;Guarding Pain Intervention(s): Monitored during session;Repositioned  Home Living                                          Prior Functioning/Environment              Frequency  Min 3X/week        Progress Toward Goals  OT Goals(current goals can now be found in the care plan section)  Progress towards OT goals: Progressing toward goals  Acute Rehab OT Goals Patient Stated Goal: to not fall  Plan Discharge plan remains appropriate;Frequency remains appropriate    Co-evaluation    PT/OT/SLP Co-Evaluation/Treatment: Yes Reason for Co-Treatment: Complexity of the patient's impairments (  multi-system involvement);Necessary to address cognition/behavior during functional activity;To address functional/ADL transfers;For patient/therapist safety PT goals addressed during session: Mobility/safety with mobility;Balance;Proper use of DME OT goals addressed during session: ADL's and self-care      AM-PAC OT "6 Clicks" Daily Activity     Outcome Measure   Help from another person eating meals?: Total Help from another person taking care of personal grooming?: A Lot Help from another person toileting, which includes using toliet, bedpan, or urinal?: A Lot Help from another person bathing (including washing, rinsing, drying)?: Total Help from another person to put on and taking off regular upper body clothing?: A Lot Help from another person to put on and taking off regular lower body clothing?: Total 6 Click Score: 9    End of Session    OT Visit Diagnosis: Unsteadiness on feet (R26.81);Other  abnormalities of gait and mobility (R26.89);Muscle weakness (generalized) (M62.81);Low vision, both eyes (H54.2);Apraxia (R48.2);Other symptoms and signs involving cognitive function;Hemiplegia and hemiparesis Symptoms and signs involving cognitive functions: Cerebral infarction Hemiplegia - Right/Left: Left Hemiplegia - dominant/non-dominant: Non-Dominant Hemiplegia - caused by: Cerebral infarction Pain - Right/Left: Right Pain - part of body:  (abdomen)   Activity Tolerance     Patient Left     Nurse Communication          Time: 6767-2094 OT Time Calculation (min): 42 min  Charges: OT General Charges $OT Visit: 1 Visit OT Treatments $Neuromuscular Re-education: 8-22 mins  Eber Jones., OTR/L Acute Rehabilitation Services Pager [phone] Office [phone]    Jeani Hawking M 05/08/2021, 5:14 PM

## 2021-05-08 NOTE — TOC Progression Note (Signed)
Transition of Care [facility]) - Progression Note    Patient Details  Name: Krista Maddox MRN: [id number] Date of Birth: May 25, 1986  Transition of Care [facility]) CM/SW Contact  Eduard Roux, [place] Phone Number: 05/08/2021, 5:10 PM  Clinical Narrative:     [facility] decline- insurance is not in network  Antony Blackbird, MSW, LCSW Clinical Social Worker   Expected Discharge Plan: Skilled Nursing Facility Barriers to Discharge: Continued Medical Work up,Insurance Authorization  Expected Discharge Plan and Services Expected Discharge Plan: Skilled Nursing Facility       Living arrangements for the past 2 months: Single Family Home                                       Social Determinants of Health (SDOH) Interventions    Readmission Risk Interventions No flowsheet data found.

## 2021-05-08 NOTE — Progress Notes (Signed)
Orthopedic Tech Progress Note Patient Details:  Krista Maddox 1986-11-13 [id number] Left ab. Binder with nurse Ortho Devices Type of Ortho Device: Abdominal binder Ortho Device/Splint Location: STOMACH Ortho Device/Splint Interventions: Ordered   Post Interventions Patient Tolerated: Well Instructions Provided: Care of device   Krista Maddox 05/08/2021, 6:11 PM

## 2021-05-09 DIAGNOSIS — I6601 Occlusion and stenosis of right middle cerebral artery: Secondary | ICD-10-CM | POA: Diagnosis not present

## 2021-05-09 DIAGNOSIS — I63511 Cerebral infarction due to unspecified occlusion or stenosis of right middle cerebral artery: Secondary | ICD-10-CM | POA: Diagnosis not present

## 2021-05-09 LAB — GLUCOSE, CAPILLARY
Glucose-Capillary: 103 mg/dL — ABNORMAL HIGH (ref 70–99)
Glucose-Capillary: 107 mg/dL — ABNORMAL HIGH (ref 70–99)
Glucose-Capillary: 90 mg/dL (ref 70–99)
Glucose-Capillary: 81 mg/dL (ref 70–99)
Glucose-Capillary: 94 mg/dL (ref 70–99)

## 2021-05-09 LAB — PROTIME-INR
INR: 3 — ABNORMAL HIGH (ref 0.8–1.2)
Prothrombin Time: 30.9 seconds — ABNORMAL HIGH (ref 11.4–15.2)

## 2021-05-09 MED ORDER — WARFARIN SODIUM 7.5 MG PO TABS
7.5000 mg | ORAL_TABLET | ORAL | Status: DC
  Administered 2021-05-09 – 2021-05-14 (×4): 7.5 mg via ORAL
  Filled 2021-05-09 (×4): qty 1

## 2021-05-09 MED ORDER — WARFARIN SODIUM 5 MG PO TABS
10.0000 mg | ORAL_TABLET | ORAL | Status: DC
  Administered 2021-05-11 – 2021-05-15 (×3): 10 mg via ORAL
  Filled 2021-05-09 (×3): qty 2

## 2021-05-09 NOTE — Progress Notes (Signed)
ANTICOAGULATION CONSULT NOTE  Pharmacy Consult for warfarin Indication:  Acute stroke  Allergies  Allergen Reactions  . Tape Rash    Cannot tolerate paper tape    Patient Measurements: Height: 5\' 5"  (165.1 cm) Weight: 111 kg (244 lb 11.4 oz) IBW/kg (Calculated) : 57   Vital Signs: Temp: 98.4 F (36.9 C) (05/28 0739) Temp Source: Axillary (05/28 0739) BP: 125/86 (05/28 0739) Pulse Rate: 105 (05/28 0739)  Labs: Recent Labs    05/07/21 0328 05/09/21 0507  LABPROT 27.3* 30.9*  INR 2.5* 3.0*    Estimated Creatinine Clearance: 122.9 mL/min (by C-G formula based on SCr of 0.71 mg/dL).  Assessment: 35 y.o female on apixaban PTA for hx prior strokes, ?DVT. Last dose PTA " unknown," admitted 04/02/21 with acute CVA. No TPA due to apixaban pta.  Underwent thrombectomy on 5/21.  Pharmacy consulted for Coumadin dosing.  INR therapeutic 3 this am.  No bleeding reported.  Will start a maintenance regimen: Warfarin MWF 10 mg and T,TH,Sat, Sun 7.5 mg  Goal of Therapy:  INR 2.5-3.5 per Neurology Monitor platelets by anticoagulation protocol: Yes   Plan:  Start Warfarin MWF 10 mg and T,TH,Sat, Sun 7.5 mg F/U INR 2 times per week   Wed, PharmD, [facility] Clinical Pharmacist Please see AMION for all Pharmacists' Contact Phone Numbers 05/09/2021, 8:33 AM

## 2021-05-09 NOTE — Progress Notes (Addendum)
STROKE TEAM PROGRESS NOTE   INTERVAL HISTORY No acute events.  Afebrile.  VSS. INR 3.0  Patient is much improved today sitting up in bed with aunt and friend tearful at the sight of them, talking in full sentences to them, operating Iphone and swiping through pictures on the phone. She is naming people and pets in the pictures. I explained her current diagnosis, plan of care and discharge planning. She was much less attentive to myself and the bedside RN.   RN reports she ate 80% of the meal she had yesterday.   Vitals:   05/08/21 1912 05/08/21 2259 05/09/21 0259 05/09/21 0739  BP: (!) 106/58 115/74 115/74 125/86  Pulse: (!) 109 96 91 (!) 105  Resp: 20 19 19 20   Temp: 99.9 F (37.7 C) 99.2 F (37.3 C) 97.8 F (36.6 C) 98.4 F (36.9 C)  TempSrc: Axillary Axillary Axillary Axillary  SpO2: 98% 97% 97% 99%  Weight:      Height:       CBC:  Recent Labs  Lab 05/04/21 0629 05/06/21 0455  WBC 8.1 8.0  HGB 11.2* 11.1*  HCT 36.1 35.5*  MCV 91.4 92.2  PLT 245 257   Basic Metabolic Panel:  Recent Labs  Lab 05/04/21 0629 05/06/21 0455  NA 138 138  K 3.5 3.5  CL 107 106  CO2 23 25  GLUCOSE 105* 93  BUN 16 18  CREATININE 0.64 0.71  CALCIUM 8.8* 8.9   IMAGING past 24 hours No results found.  PHYSICAL EXAM  Temp:  [97.8 F (36.6 C)-99.9 F (37.7 C)] 98.4 F (36.9 C) (05/28 0739) Pulse Rate:  [91-109] 105 (05/28 0739) Resp:  [16-20] 20 (05/28 0739) BP: (106-125)/(58-86) 125/86 (05/28 0739) SpO2:  [97 %-99 %] 99 % (05/28 0739)  General - morbid obesity, well developed, in no apparent distress.  Resp: No extra work of breathing.  Cardiovascular - Regular rhythm and rate. Abd: soft, round, NTTP, Non-distended  Ext: warm and well perfused  Neuro - Eyes open, awake alert, attempted to speak, dysarthric, not following commands. Able to participate with EOMs: Right gaze preference, but able to cross midline and left gaze incomplete.  Blinking to visual threat  bilaterally. Left facial droop. Left upper extremity 0/5 but increased muscle tone, left LE slight withdraw to pain but also with increased muscle tone.Right upper extremity spontaneous movement against gravity. Right lower extremity at least 3/5 spontaneously. Sensation, coordination and gait not able to be tested.  ASSESSMENT/PLAN Ms. MESSINA KOSINSKI is a 35 y.o. female with history of multiple strokes who presented as a code stroke with symptoms suggestive of LVO. tPA was not given due to Eliquis. CTA head and neck revealed an occlusion of distal right M2 MCA with partial reconstitution and subsequent reocclusion of most M2 branches. Patient underwent thrombectomy.    Recurrent cryptogenic embolic strokes - Acute infarct of the R MCA due to right M1 occlusion s/p IR with TICI2c, cryptogenic etiology   Code Stroke: CT head No acute intracranial hemorrhage. Acute infarcts in the right MCA territory. ASPECTS 8.    CTA head & neck Occlusion of distal right M1 MCA with partial reconstitution and subsequent reocclusion of most M2 branches. Poor collaterals.  CT Perfusion imaging demonstrates core infarction 99 mL and penumbra of 34 mL. The extent of core infarction is calculated at greater than apparent loss of gray-white differentiation on noncontrast head CT. Therefore, penumbra may be greater.  2D Echo LVEF 60-65%, no structural abnormalities noted  that could increase risk for CVA  Repeat CT Head 4/24 - large left MCA infarct, no midline shift. Small SAH sylvian fissure, diminishing in size  Hypercoagulable panel: Homocysteine 15.1 due to smoking, otherwise negative to date  LDL 48  HgbA1c 5.7  VTE prophylaxis - SCDs + Lovenox  aspirin 325 mg daily and Eliquis (apixaban) daily prior to admission, now on ASA 325 and coumadin (pharmacy managed) with INR goal 2.5-3.5.  INR 3.0  Therapy recommendations:  SNF  Disposition:  SNF pending  Hx of cryptogenic strokes  In 08/2017  patient admitted for left sided numbness and headache.  MRI showed right cerebellum, right temporoparietal small infarcts.  EF 55 to 60%, CT head and neck unremarkable.  LDL 89, A1c 4.8.  TCD bubble study showed trivial PFO.  DVT negative.  TEE no PFO.  Hypercoagulable and autoimmune work-up negative.  Discharged with aspirin and Lipitor.  01/2018 episode of left facial numbness and left leg weakness.  CT head and neck unremarkable.  MRI negative.  06/2018 admitted to [facility] for right hand/face numbness.  MRI showed right tiny parietal infarct.  10/2020 admitted to [facility] for aphasia status post tPA.  CTA neck showed left M2 occlusion.  MRI showed left MCA and punctate right frontal infarct.  TCD bubble study potential trivial PFO, TEE tiny PFO.  11/2020 she was referred to loop recorder which was not done as physician thought she should be candidate for anticoagulation.  Started on Eliquis 5 mg twice daily.  Skin rash with blisters, resolved  Left flank skin lesion with blisters, concerning for shingles  Varicella zoster PCR negative, spoke with IC RN and precautions discontinued 5/6.   Off contact and airborne isolation; resolved  Valtrex discontinued   Hyperlipidemia  Home meds:  atrovastatin 80mg  and zetia 10mg    Will restart post CT if stable  LDL 48, goal < 70  Resumed lipitor 80 and zetia 10  Continue statin at discharge   Dysphagia- Regular thin liquid   SLP and RD following, has progressed to regular diet   S/p PEG    Consider trial of nocturnal feedings   Birth control  Recommend OP PCP or OBGYN discontinue Depo - provera due to numerous Contraindications including hx of tobacco use, migraines, and multiple strokes.    Tobacco abuse  Current smoker  Smoking cessation counseling will be provided  Contraindicated for birth control Depo   Hx of pseudotumor cerebri Chronic migraine  Follows with Dr. 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   Depression  Increase Effexor to 150mg  bid  Other Stroke Risk Factors  Morbid Obesity, Body mass index is 42.12 kg/m., BMI >/= 30 associated with increased stroke risk, recommend weight loss, diet and exercise as appropriate   Obstructive sleep apnea   Hospital day # 37  Delila A Bailey-Modzik, NP-C   ATTENDING NOTE: I reviewed above note and agree with the assessment and plan. No acute event overnight. Pt is improving neurologically. INR 3.0 today. Pending SNF placement.  For detailed assessment and plan, please refer to above as I have made changes wherever appropriate.   [facility], MD PhD Stroke Neurology 05/09/2021 8:14 PM     To contact Stroke Continuity provider, please refer to 11/2017. After hours, contact General Neurology

## 2021-05-09 NOTE — Progress Notes (Signed)
Physical Therapy Treatment Patient Details Name: Krista Maddox MRN: [id number] DOB: June 21, 1986 Today's Date: 05/09/2021    History of Present Illness The pt is a 35 yo female presenting with L sided facial droop and L sided weakness on 4/21. Underwent angiogram and revascularization on 4/21. CT head showing large right MCA territory infarct with 4 mm leftward midline shift. S/p PEG 5/5. 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    PT Comments    Pt is making good, steady progress with more frequent therapy sessions, demonstrating improved insight into her condition and activation of her L side. Pt was able to purposefully lift her L leg 1x after being provided simple multi-modal cues and ~15 seconds to respond. In addition, she continues to display improved functional mobility initiation, attempting sit <> stand transfers at EOB x4 reps this date with maxAx1. Pt was able to clear her buttocks further with each rep to the point she got about halfway up before impulsively lifting her R leg 2x to reposition it. Pt was returned to sit then supine to ensure her safety as PT did not have +2 this date. Pt continues to display R UE and lower extremity pushing positions with higher functional tasks. Will continue to follow acutely. Current recommendations remain appropriate.   Follow Up Recommendations  Other (comment);Supervision/Assistance - 24 hour (Inpatient Rehab with CVA specialty rehab team)     Equipment Recommendations  Other (comment) (defer to next venue of care)    Recommendations for Other Services       Precautions / Restrictions Precautions Precautions: Fall Precaution Comments: abdominal binder; peg Required Braces or Orthoses: Other Brace Other Brace: L PRAFO Restrictions Weight Bearing Restrictions: No    Mobility  Bed Mobility Overal bed mobility: Needs Assistance Bed Mobility: Supine to Sit;Sit to  Supine;Rolling Rolling: Max assist   Supine to sit: Max assist;HOB elevated;Mod assist Sit to supine: Max assist   General bed mobility comments: Pt having difficulty following commands to initiate supine > sit L EOB, needing maxA to initiate but modA to complete as pt began to reach for end of bed rail with R hand. Pt needing hand-over-hand guidance to begin reach for rail to roll, maxA to complete.    Transfers Overall transfer level: Needs assistance Equipment used: 1 person hand held assist Transfers: Sit to/from Stand Sit to Stand: Max assist         General transfer comment: Sit > stand attempted from EOB 1x with posterior aspect of recliner placed anterior to her for her to pull up on, but pt continued to place her R UE on rail at foot of bed, min buttocks clearance with maxA and L knee blocked. x3 additional sit <> stand reps attempted at EOB with L knee blocked and pt pushing up on rail at end of bed with R UE, maxA with improved buttocks clearance and pt initation each rep. Did not come to full upright stand as pt became anxious lifting R foot to readjust, placing it in a pushing position continually despite attempts to block it, thus pt quickly returned to supine to avoid stepping away from bed without +2 assistance for safety.  Ambulation/Gait Ambulation/Gait assistance: Max assist   Assistive device: 1 person hand held assist       General Gait Details: R hand on rail at foot of bed, L UE supported, L knee blocked, and attmpting to block R foot from being placed into a  pushing position, but pt was able to escape with her R foot and lifted it 2x while in mid-stance at EOB. Pt quickly returned to sit > supine in bed to avoid pt taking steps away from EOB without +2 to safely manage her.   Stairs             Wheelchair Mobility    Modified Rankin (Stroke Patients Only) Modified Rankin (Stroke Patients Only) Pre-Morbid Rankin Score: Moderate disability Modified  Rankin: Severe disability     Balance Overall balance assessment: Needs assistance Sitting-balance support: Feet supported;Single extremity supported;No upper extremity supported Sitting balance-Leahy Scale: Fair Sitting balance - Comments: sitting EOB during session with close supervision with moments of no UE support and moments of RUE supported on end of bed.   Standing balance support: Single extremity supported Standing balance-Leahy Scale: Poor Standing balance comment: UE support and maxAx1 for partial stand balance for brief period.                            Cognition Arousal/Alertness: Awake/alert Behavior During Therapy: Anxious;Impulsive;Restless Overall Cognitive Status: Impaired/Different from baseline Area of Impairment: Attention;Following commands;Awareness;Problem solving;Safety/judgement                   Current Attention Level: Sustained (for short bouts)   Following Commands: Follows one step commands inconsistently;Follows one step commands with increased time (multi-modal) Safety/Judgement: Decreased awareness of safety;Decreased awareness of deficits Awareness: Emergent Problem Solving: Slow processing;Decreased initiation;Difficulty sequencing;Requires verbal cues;Requires tactile cues General Comments: RN reporting pt was speaking in complete sentences with family earlier in AM, with noted awareness into her condition with RN reporting pt asked "did I have another one?". During session, pt demonstrates moments of flat affect, avoiding eye contact, emotional crying when looking at picture of daughter, and laughing. Pt followed multi-modal simple command to lift L leg 1x with a delay of ~15 seconds. Pt continues to display poor awarness into midline, demonstrating pushing positioning in sitting and more so in standing.      Exercises      General Comments        Pertinent Vitals/Pain Pain Assessment: Faces Faces Pain Scale: Hurts a  little bit Pain Location: generalized with mobility Pain Descriptors / Indicators: Discomfort;Grimacing Pain Intervention(s): Limited activity within patient's tolerance;Monitored during session;Repositioned    Home Living                      Prior Function            PT Goals (current goals can now be found in the care plan section) Acute Rehab PT Goals Patient Stated Goal: to get up PT Goal Formulation: Patient unable to participate in goal setting Time For Goal Achievement: 05/11/21 Potential to Achieve Goals: Good Progress towards PT goals: Progressing toward goals    Frequency    Min 4X/week      PT Plan Current plan remains appropriate    Co-evaluation              AM-PAC PT "6 Clicks" Mobility   Outcome Measure  Help needed turning from your back to your side while in a flat bed without using bedrails?: A Lot Help needed moving from lying on your back to sitting on the side of a flat bed without using bedrails?: A Lot Help needed moving to and from a bed to a chair (including a wheelchair)?: Total Help needed standing up  from a chair using your arms (e.g., wheelchair or bedside chair)?: Total Help needed to walk in hospital room?: Total Help needed climbing 3-5 steps with a railing? : Total 6 Click Score: 8    End of Session Equipment Utilized During Treatment: Gait belt Activity Tolerance: Patient tolerated treatment well Patient left: in bed;with call bell/phone within reach;with bed alarm set;with SCD's reapplied;Other (comment) (with L hand splint donned and NT instructed to doff in ~2 hrs) Nurse Communication: Mobility status;Other (comment) (NT instructed to doff L hand splint in ~2 hrs) PT Visit Diagnosis: Hemiplegia and hemiparesis;Other abnormalities of gait and mobility (R26.89);Difficulty in walking, not elsewhere classified (R26.2);Other symptoms and signs involving the nervous system (R29.898);Muscle weakness (generalized)  (M62.81) Hemiplegia - Right/Left: Left Hemiplegia - dominant/non-dominant: Non-dominant Hemiplegia - caused by: Cerebral infarction     Time: 5956-3875 PT Time Calculation (min) (ACUTE ONLY): 43 min  Charges:  $Therapeutic Activity: 38-52 mins                     Raymond Gurney, PT, DPT Acute Rehabilitation Services  Pager: [phone] Office: [phone]    Jewel Baize 05/09/2021, 3:52 PM

## 2021-05-10 DIAGNOSIS — Z8673 Personal history of transient ischemic attack (TIA), and cerebral infarction without residual deficits: Secondary | ICD-10-CM | POA: Diagnosis not present

## 2021-05-10 DIAGNOSIS — I6601 Occlusion and stenosis of right middle cerebral artery: Secondary | ICD-10-CM | POA: Diagnosis not present

## 2021-05-10 DIAGNOSIS — I63511 Cerebral infarction due to unspecified occlusion or stenosis of right middle cerebral artery: Secondary | ICD-10-CM | POA: Diagnosis not present

## 2021-05-10 DIAGNOSIS — F17200 Nicotine dependence, unspecified, uncomplicated: Secondary | ICD-10-CM | POA: Diagnosis not present

## 2021-05-10 LAB — GLUCOSE, CAPILLARY
Glucose-Capillary: 104 mg/dL — ABNORMAL HIGH (ref 70–99)
Glucose-Capillary: 105 mg/dL — ABNORMAL HIGH (ref 70–99)
Glucose-Capillary: 105 mg/dL — ABNORMAL HIGH (ref 70–99)
Glucose-Capillary: 111 mg/dL — ABNORMAL HIGH (ref 70–99)
Glucose-Capillary: 90 mg/dL (ref 70–99)
Glucose-Capillary: 95 mg/dL (ref 70–99)
Glucose-Capillary: 98 mg/dL (ref 70–99)

## 2021-05-10 NOTE — Progress Notes (Addendum)
STROKE TEAM PROGRESS NOTE   INTERVAL HISTORY Pt aunt visited yesterday. Pt father and daughter visited her this am. Since they left, pt was intermittent crying and also with tachycardia. On my encounter, pt lying in bed, moving left UE and LE but still has right hemiplegia. She was able to articulate but word salad. INR 3.0 on 5/28.    Vitals:   05/09/21 2014 05/10/21 0000 05/10/21 0400 05/10/21 0500  BP: 114/83 (!) 124/91 119/73   Pulse:      Resp:      Temp: 98.5 F (36.9 C) 98.4 F (36.9 C) 98 F (36.7 C)   TempSrc: Oral Oral Oral   SpO2:      Weight:    111 kg  Height:       CBC:  Recent Labs  Lab 05/04/21 0629 05/06/21 0455  WBC 8.1 8.0  HGB 11.2* 11.1*  HCT 36.1 35.5*  MCV 91.4 92.2  PLT 245 257   Basic Metabolic Panel:  Recent Labs  Lab 05/04/21 0629 05/06/21 0455  NA 138 138  K 3.5 3.5  CL 107 106  CO2 23 25  GLUCOSE 105* 93  BUN 16 18  CREATININE 0.64 0.71  CALCIUM 8.8* 8.9   IMAGING past 24 hours No results found.  PHYSICAL EXAM  Temp:  [98 F (36.7 C)-98.6 F (37 C)] 98 F (36.7 C) (05/29 0400) Pulse Rate:  [109-110] 110 (05/28 1528) Resp:  [20] 20 (05/28 1528) BP: (114-136)/(73-91) 119/73 (05/29 0400) SpO2:  [98 %-99 %] 99 % (05/28 1528) Weight:  [295 kg] 111 kg (05/29 0500)  General - morbid obesity, well developed, in no apparent distress.  Resp: No extra work of breathing.  Cardiovascular - Regular rhythm and rate. Abd: soft, round, NTTP, Non-distended  Ext: warm and well perfused  Neuro - Eyes open, awake alert, able to articulate with words but still word salad not making sense, mildly dysarthric, not following commands. Right gaze preference, but able to cross midline and left gaze incomplete.  Blinking to visual threat bilaterally. Left facial droop. Left upper extremity 0/5 but increased muscle tone, left LE slight withdraw to pain but also with increased muscle tone.Right upper extremity spontaneous movement against  gravity. Right lower extremity at least 3/5 spontaneously. Sensation, coordination and gait not able to be tested.  ASSESSMENT/PLAN Krista Maddox is a 35 y.o. female with history of multiple strokes who presented as a code stroke with symptoms suggestive of LVO. tPA was not given due to Eliquis. CTA head and neck revealed an occlusion of distal right M2 MCA with partial reconstitution and subsequent reocclusion of most M2 branches. Patient underwent thrombectomy.    Recurrent cryptogenic embolic strokes - Acute infarct of the R MCA due to right M1 occlusion s/p IR with TICI2c, cryptogenic etiology   Code Stroke: CT head No acute intracranial hemorrhage. Acute infarcts in the right MCA territory. ASPECTS 8.    CTA head & neck Occlusion of distal right M1 MCA with partial reconstitution and subsequent reocclusion of most M2 branches. Poor collaterals.  CT Perfusion imaging demonstrates core infarction 99 mL and penumbra of 34 mL. The extent of core infarction is calculated at greater than apparent loss of gray-white differentiation on noncontrast head CT. Therefore, penumbra may be greater.  2D Echo LVEF 60-65%, no structural abnormalities noted that could increase risk for CVA  Repeat CT Head 4/24 - large left MCA infarct, no midline shift. Small SAH sylvian fissure, diminishing in size  Hypercoagulable panel: Homocysteine 15.1 due to smoking, otherwise negative to date  LDL 48  HgbA1c 5.7  VTE prophylaxis - SCDs + Lovenox  aspirin 325 mg daily and Eliquis (apixaban) daily prior to admission, now on ASA 325 and coumadin (pharmacy managed) with INR goal 2.5-3.5.  INR 3.0 on 5/29  Therapy recommendations:  SNF  Disposition:  SNF pending  Hx of cryptogenic strokes  In 08/2017 patient admitted for left sided numbness and headache.  MRI showed right cerebellum, right temporoparietal small infarcts.  EF 55 to 60%, CT head and neck unremarkable.  LDL 89, A1c 4.8.  TCD bubble study  showed trivial PFO.  DVT negative.  TEE no PFO.  Hypercoagulable and autoimmune work-up negative.  Discharged with aspirin and Lipitor.  01/2018 episode of left facial numbness and left leg weakness.  CT head and neck unremarkable.  MRI negative.  06/2018 admitted to [facility] for right hand/face numbness.  MRI showed right tiny parietal infarct.  10/2020 admitted to [facility] for aphasia status post tPA.  CTA neck showed left M2 occlusion.  MRI showed left MCA and punctate right frontal infarct.  TCD bubble study potential trivial PFO, TEE tiny PFO.  11/2020 she was referred to loop recorder which was not done as physician thought she should be candidate for anticoagulation.  Started on Eliquis 5 mg twice daily.  Skin rash with blisters, resolved  Left flank skin lesion with blisters, concerning for shingles  Varicella zoster PCR negative, spoke with IC RN and precautions discontinued 5/6.   Off contact and airborne isolation; resolved  Valtrex discontinued   Hyperlipidemia  Home meds:  atrovastatin 80mg  and zetia 10mg    Will restart post CT if stable  LDL 48, goal < 70  Resumed lipitor 80 and zetia 10  Continue statin at discharge   Dysphagia   SLP and RD following   Now on Regular thin liquid  Low po intake, still on TF  S/p PEG    May consider trial of nocturnal feedings   Birth control  Recommend OP PCP or OBGYN discontinue Depo - provera due to numerous Contraindications including hx of tobacco use, migraines, and multiple strokes.    Tobacco abuse  Current smoker  Smoking cessation counseling will be provided  Contraindicated for birth control Depo   Hx of pseudotumor cerebri Chronic migraine  Follows with Dr. 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   Depression  Depressed mood after father and daughter left  Has increase Effexor to 150mg  bid  Other Stroke Risk  Factors  Morbid Obesity, Body mass index is 42.12 kg/m., BMI >/= 30 associated with increased stroke risk, recommend weight loss, diet and exercise as appropriate   Obstructive sleep apnea   Tachycardia - since family visit over the weekend ? Related to depressed mood?   Hospital day # 70    , MD PhD Stroke Neurology 05/10/2021 7:56 AM     To contact Stroke Continuity provider, please refer to . After hours, contact General Neurology

## 2021-05-11 DIAGNOSIS — R000 Tachycardia, unspecified: Secondary | ICD-10-CM

## 2021-05-11 DIAGNOSIS — I63511 Cerebral infarction due to unspecified occlusion or stenosis of right middle cerebral artery: Secondary | ICD-10-CM | POA: Diagnosis not present

## 2021-05-11 LAB — CBC
HCT: 37.6 % (ref 36.0–46.0)
Hemoglobin: 11.6 g/dL — ABNORMAL LOW (ref 12.0–15.0)
MCH: 28.2 pg (ref 26.0–34.0)
MCHC: 30.9 g/dL (ref 30.0–36.0)
MCV: 91.5 fL (ref 80.0–100.0)
Platelets: 252 10*3/uL (ref 150–400)
RBC: 4.11 MIL/uL (ref 3.87–5.11)
RDW: 15.5 % (ref 11.5–15.5)
WBC: 8.4 10*3/uL (ref 4.0–10.5)
nRBC: 0 % (ref 0.0–0.2)

## 2021-05-11 LAB — GLUCOSE, CAPILLARY
Glucose-Capillary: 92 mg/dL (ref 70–99)
Glucose-Capillary: 103 mg/dL — ABNORMAL HIGH (ref 70–99)
Glucose-Capillary: 90 mg/dL (ref 70–99)

## 2021-05-11 LAB — PROTIME-INR
INR: 2.8 — ABNORMAL HIGH (ref 0.8–1.2)
Prothrombin Time: 29.7 seconds — ABNORMAL HIGH (ref 11.4–15.2)

## 2021-05-11 LAB — BASIC METABOLIC PANEL
Anion gap: 6 (ref 5–15)
BUN: 16 mg/dL (ref 6–20)
CO2: 24 mmol/L (ref 22–32)
Calcium: 8.8 mg/dL — ABNORMAL LOW (ref 8.9–10.3)
Chloride: 107 mmol/L (ref 98–111)
Creatinine, Ser: 0.62 mg/dL (ref 0.44–1.00)
GFR, Estimated: >60 mL/min (ref >60)
Glucose, Bld: 102 mg/dL — ABNORMAL HIGH (ref 70–99)
Potassium: 3.3 mmol/L — ABNORMAL LOW (ref 3.5–5.1)
Sodium: 137 mmol/L (ref 135–145)

## 2021-05-11 NOTE — Progress Notes (Addendum)
Physical Therapy Treatment Patient Details Name: Krista Maddox MRN: [id number] DOB: 1986-06-24 Today's Date: 05/11/2021    History of Present Illness The pt is a 35 yo female presenting with L sided facial droop and L sided weakness on 4/21. Underwent angiogram and revascularization on 4/21. CT head showing large right MCA territory infarct with 4 mm leftward midline shift. S/p PEG 5/5. 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    PT Comments    Serena Croissant with pt daughter to facilitate engagement and participation this session. Pt initially smiling and reaching for phone on the left side. Session focused on sitting balance, promoting left sided awareness, and functional mobility. Pt requiring two person maximal assist for all aspects of mobility. Attempted to use chair placed in front of pt in order to allow pt to pull up with R hand and shift weight anteriorly, however, pt frequently removing hand. Once returned to supine, pt able to bridge x 3 before diminished attention. Will continue to progress mobility as tolerated.     Follow Up Recommendations  Other (comment);Supervision/Assistance - 24 hour (Inpatient Rehab with CVA specialty rehab team)     Equipment Recommendations  Wheelchair (measurements PT);Wheelchair cushion (measurements PT);Hospital bed (hoyer lift)    Recommendations for Other Services       Precautions / Restrictions Precautions Precautions: Fall Precaution Comments: abdominal binder; peg Required Braces or Orthoses: Other Brace Other Brace: L PRAFO Restrictions Weight Bearing Restrictions: No    Mobility  Bed Mobility Overal bed mobility: Needs Assistance Bed Mobility: Supine to Sit;Sit to Supine;Rolling Rolling: Max assist;+2 for physical assistance (to right side)   Supine to sit: Max assist;HOB elevated;+2 for physical assistance Sit to supine: Max assist;+2 for physical assistance    General bed mobility comments: Pt with decreased initiation, requiring maxA + 2 for supine <> sit    Transfers Overall transfer level: Needs assistance Equipment used:  (chair) Transfers: Sit to/from Stand Sit to Stand: +2 physical assistance;Max assist         General transfer comment: Attempted guiding R hand to back of recliner chair to facilitate anterior weight shift, however, pt frequently removing hand. requiring maxA + 2 to clear hips x 2 from edge of bed. Pt with minimal glute activation  Ambulation/Gait                 Stairs             Wheelchair Mobility    Modified Rankin (Stroke Patients Only) Modified Rankin (Stroke Patients Only) Pre-Morbid Rankin Score: Moderate disability Modified Rankin: Severe disability     Balance Overall balance assessment: Needs assistance Sitting-balance support: Feet supported;Single extremity supported;No upper extremity supported Sitting balance-Leahy Scale: Fair Sitting balance - Comments: sitting EOB during session with close supervision with moments of no UE support and moments of RUE supported on end of bed.                                    Cognition Arousal/Alertness: Awake/alert Behavior During Therapy: Restless Overall Cognitive Status: Impaired/Different from baseline Area of Impairment: Attention;Following commands;Awareness;Problem solving;Safety/judgement                   Current Attention Level: Focused   Following Commands: Follows one step commands inconsistently;Follows one step commands with increased time Safety/Judgement: Decreased awareness of safety;Decreased awareness of deficits Awareness:  Emergent Problem Solving: Slow processing;Decreased initiation;Difficulty sequencing;Requires verbal cues;Requires tactile cues General Comments: Pt excited and smiling to initially see her daughter, Joice Lofts on facetime, reaching for phone. She shows increased awareness of L  side, using R arm at times to support L. Intermittently, pt will have flat affect and avoid eye contact. When dining services entered room, pt able to communicate with them via thumbs up when given food choices. Follows 1 step commands inconsistently; needs repetition and increased time      Exercises General Exercises - Upper Extremity Shoulder Flexion: PROM;Left;10 reps;Supine General Exercises - Lower Extremity Ankle Circles/Pumps: PROM;Left;10 reps;Supine Heel Slides: PROM;Left;10 reps;Supine Other Exercises Other Exercises: Supine: x 3 hip bridging with LLE supported Other Exercises: Sitting: reaching with R hand in all planes    General Comments        Pertinent Vitals/Pain Pain Assessment: Faces Faces Pain Scale: Hurts a little bit Pain Location: generalized with mobility Pain Descriptors / Indicators: Discomfort;Grimacing Pain Intervention(s): Monitored during session    Home Living                      Prior Function            PT Goals (current goals can now be found in the care plan section) Acute Rehab PT Goals Patient Stated Goal: pt family would like her to be able to go home PT Goal Formulation: Patient unable to participate in goal setting Time For Goal Achievement: 05/25/21 Potential to Achieve Goals: Good Progress towards PT goals: Progressing toward goals    Frequency    Min 4X/week      PT Plan Current plan remains appropriate    Co-evaluation              AM-PAC PT "6 Clicks" Mobility   Outcome Measure  Help needed turning from your back to your side while in a flat bed without using bedrails?: Total Help needed moving from lying on your back to sitting on the side of a flat bed without using bedrails?: Total Help needed moving to and from a bed to a chair (including a wheelchair)?: Total Help needed standing up from a chair using your arms (e.g., wheelchair or bedside chair)?: Total Help needed to walk in hospital room?:  Total Help needed climbing 3-5 steps with a railing? : Total 6 Click Score: 6    End of Session Equipment Utilized During Treatment: Gait belt Activity Tolerance: Patient tolerated treatment well Patient left: in bed;with call bell/phone within reach;with bed alarm set;with SCD's reapplied Nurse Communication: Mobility status PT Visit Diagnosis: Hemiplegia and hemiparesis;Other abnormalities of gait and mobility (R26.89);Difficulty in walking, not elsewhere classified (R26.2);Other symptoms and signs involving the nervous system (R29.898);Muscle weakness (generalized) (M62.81) Hemiplegia - Right/Left: Left Hemiplegia - dominant/non-dominant: Non-dominant Hemiplegia - caused by: Cerebral infarction     Time: 1696-7893 PT Time Calculation (min) (ACUTE ONLY): 49 min  Charges:  $Therapeutic Activity: 38-52 mins                     Lillia Pauls, PT, DPT Acute Rehabilitation Services Pager [phone] Office [phone]    Norval Morton 05/11/2021, 1:28 PM

## 2021-05-11 NOTE — Progress Notes (Signed)
ANTICOAGULATION CONSULT NOTE  Pharmacy Consult:  Coumadin Indication:  Acute stroke  Allergies  Allergen Reactions  . Tape Rash    Cannot tolerate paper tape    Patient Measurements: Height: 5\' 5"  (165.1 cm) Weight: 111 kg (244 lb 11.4 oz) IBW/kg (Calculated) : 57   Vital Signs: Temp: 98.1 F (36.7 C) (05/30 1153) Temp Source: Oral (05/30 1153) BP: 126/78 (05/30 1153) Pulse Rate: 124 (05/30 1153)  Labs: Recent Labs    05/09/21 0507 05/11/21 0440  HGB  --  11.6*  HCT  --  37.6  PLT  --  252  LABPROT 30.9* 29.7*  INR 3.0* 2.8*  CREATININE  --  0.62    Estimated Creatinine Clearance: 122.9 mL/min (by C-G formula based on SCr of 0.62 mg/dL).  Assessment: 35 y.o female on apixaban PTA for hx prior strokes, ?DVT.  Last dose PTA " unknown," admitted 04/02/21 with acute CVA.  No TPA due to apixaban pta.  Underwent thrombectomy on 5/21.  Pharmacy consulted for Coumadin dosing.  INR therapeutic and stable; no bleeding reported.  Goal of Therapy:  INR 2.5-3.5 per Neurology Monitor platelets by anticoagulation protocol: Yes   Plan:  Continue Coumadin 7.5mg  daily except 10mg  on MWF INR on Mon and Thurs only  Crandall Harvel D. Sat, PharmD, BCPS, BCCCP 05/11/2021, 1:22 PM

## 2021-05-11 NOTE — Progress Notes (Addendum)
STROKE TEAM PROGRESS NOTE   INTERVAL HISTORY Pt lying in bed.  Greeted me with a smile and a wave with her right hand.  She did not answer any orientation questions this morning bue nodded appropriately.  Continues to move right UE and LE but still has left hemiplegia.   INR 2.8 on 5/30.    Vitals:   05/10/21 2345 05/11/21 0404 05/11/21 0500 05/11/21 1153  BP: 123/74 120/81  126/78  Pulse: 97 (!) 109  (!) 124  Resp: 20 18    Temp: 98.6 F (37 C) 99.2 F (37.3 C)  98.1 F (36.7 C)  TempSrc: Axillary Axillary  Oral  SpO2: 99% 98%  96%  Weight:   111 kg   Height:       CBC:  Recent Labs  Lab 05/06/21 0455 05/11/21 0440  WBC 8.0 8.4  HGB 11.1* 11.6*  HCT 35.5* 37.6  MCV 92.2 91.5  PLT 257 252   Basic Metabolic Panel:  Recent Labs  Lab 05/06/21 0455 05/11/21 0440  NA 138 137  K 3.5 3.3*  CL 106 107  CO2 25 24  GLUCOSE 93 102*  BUN 18 16  CREATININE 0.71 0.62  CALCIUM 8.9 8.8*   IMAGING past 24 hours No results found.  PHYSICAL EXAM  Temp:  [97.6 F (36.4 C)-99.2 F (37.3 C)] 98.1 F (36.7 C) (05/30 1153) Pulse Rate:  [97-140] 124 (05/30 1153) Resp:  [16-22] 18 (05/30 0404) BP: (112-138)/(66-85) 126/78 (05/30 1153) SpO2:  [96 %-100 %] 96 % (05/30 1153) Weight:  [299 kg] 111 kg (05/30 0500)  General - morbid obesity, well developed, in no apparent distress.  Resp: No extra work of breathing.  Cardiovascular - Regular rhythm and rate. Abd: soft, round, NTTP, Non-distended  Ext: warm and well perfused  Neuro - Eyes open, awake alert, does not answer any orientation questions today,mimics with right hand, waves, points with her index finger, and shows 2 fingers.   Right gaze preference, but able to cross midline and left gaze incomplete.  Blinking to visual threat bilaterally. Left facial droop. Left upper extremity 0/5 but increased muscle tone, left LE slight withdraw to pain but also with increased muscle tone.Right upper extremity spontaneous  movement against gravity. Right lower extremity at least 3/5 spontaneously. Sensation, coordination and gait not able to be tested.  ASSESSMENT/PLAN Ms. NIKESHA KWASNY is a 35 y.o. female with history of multiple strokes who presented as a code stroke with symptoms suggestive of LVO. tPA was not given due to Eliquis. CTA head and neck revealed an occlusion of distal right M2 MCA with partial reconstitution and subsequent reocclusion of most M2 branches. Patient underwent thrombectomy.    Recurrent cryptogenic embolic strokes - Acute infarct of the R MCA due to right M1 occlusion s/p IR with TICI2c, cryptogenic etiology   Code Stroke: CT head No acute intracranial hemorrhage. Acute infarcts in the right MCA territory. ASPECTS 8.    CTA head & neck Occlusion of distal right M1 MCA with partial reconstitution and subsequent reocclusion of most M2 branches. Poor collaterals.  CT Perfusion imaging demonstrates core infarction 99 mL and penumbra of 34 mL. The extent of core infarction is calculated at greater than apparent loss of gray-white differentiation on noncontrast head CT. Therefore, penumbra may be greater.  2D Echo LVEF 60-65%, no structural abnormalities noted that could increase risk for CVA  Repeat CT Head 4/24 - large left MCA infarct, no midline shift. Small SAH sylvian fissure,  diminishing in size  Hypercoagulable panel: Homocysteine 15.1 due to smoking, otherwise negative to date  LDL 48  HgbA1c 5.7  VTE prophylaxis - SCDs + Lovenox  aspirin 325 mg daily and Eliquis (apixaban) daily prior to admission, now on ASA 325 and coumadin (pharmacy managed) with INR goal 2.5-3.5.  INR 3.0->2.8  Therapy recommendations:  SNF  Disposition:  SNF pending  Hx of cryptogenic strokes  In 08/2017 patient admitted for left sided numbness and headache.  MRI showed right cerebellum, right temporoparietal small infarcts.  EF 55 to 60%, CT head and neck unremarkable.  LDL 89, A1c 4.8.   TCD bubble study showed trivial PFO.  DVT negative.  TEE no PFO.  Hypercoagulable and autoimmune work-up negative.  Discharged with aspirin and Lipitor.  01/2018 episode of left facial numbness and left leg weakness.  CT head and neck unremarkable.  MRI negative.  06/2018 admitted to [facility] for right hand/face numbness.  MRI showed right tiny parietal infarct.  10/2020 admitted to [facility] for aphasia status post tPA.  CTA neck showed left M2 occlusion.  MRI showed left MCA and punctate right frontal infarct.  TCD bubble study potential trivial PFO, TEE tiny PFO.  11/2020 she was referred to loop recorder which was not done as physician thought she should be candidate for anticoagulation.  Started on Eliquis 5 mg twice daily.  Skin rash with blisters, resolved  Left flank skin lesion with blisters, concerning for shingles  Varicella zoster PCR negative, spoke with IC RN and precautions discontinued 5/6.   Off contact and airborne isolation; resolved  Valtrex discontinued   Hyperlipidemia  Home meds:  atrovastatin 80mg  and zetia 10mg    Will restart post CT if stable  LDL 48, goal < 70  Resumed lipitor 80 and zetia 10  Continue statin at discharge   Dysphagia   SLP and RD following   Now on Regular thin liquid  Low po intake, still on TF  S/p PEG    May consider trial of nocturnal feedings   Birth control  Recommend OP PCP or OBGYN discontinue Depo - provera due to numerous Contraindications including hx of tobacco use, migraines, and multiple strokes.    Tobacco abuse  Current smoker  Smoking cessation counseling will be provided  Contraindicated for birth control Depo   Hx of pseudotumor cerebri Chronic migraine  Follows with Dr. 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   Depression  Depressed mood after father and daughter left  Has increase Effexor to 150mg   bid  Other Stroke Risk Factors  Morbid Obesity, Body mass index is 42.12 kg/m., BMI >/= 30 associated with increased stroke risk, recommend weight loss, diet and exercise as appropriate   Obstructive sleep apnea   Tachycardia - since family visit over the weekend, close monitoring, may consider low dose beta blocker if persistent    Hospital day # 39   Lissy Olivencia-Simmons, ACNP-BC 05/11/2021 12:53 PM  ATTENDING NOTE: I reviewed above note and agree with the assessment and plan.   No acute event overnight, neuro stable.  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  Still has tachycardia since weekend after family visit, will close monitoring, may consider low-dose metoprolol if persistent.  For detailed assessment and plan, please refer to above as I have made changes wherever appropriate.   , MD PhD Stroke Neurology 05/11/2021 1:18 PM      To contact Stroke Continuity  provider, please refer to [email or web address] After hours, contact General Neurology

## 2021-05-11 NOTE — Progress Notes (Addendum)
Occupational Therapy Treatment Patient Details Name: Krista Maddox MRN: [id number] DOB: [birth year] Today's Date: 05/11/2021    History of present illness The pt is a 35 yo female presenting with L sided facial droop and L sided weakness on 4/21. Underwent angiogram and revascularization on 4/21. CT head showing large right MCA territory infarct with 4 mm leftward midline shift. S/p PEG 5/5. 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   OT comments  Pt is making EXCELLENT progress toward OT goals. She was much more interactive this pm, and spontaneously interacting with her environment more.  She has been using her call light to call her nurses. 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  She was able to self initiate feeding herself 3 bites with a fork, and 5 bites with her finger.  After multi modal cues, she was able to drink from soda can spontaneously.  She looked to her left consistently during the session to look at OT, who was on her left, and to look out the window. She lifted her blanket x 3 to  lookunder it  for her Lt hand.  She gave fist bump x 2, and was able to move to EOB with mod A, and min A to return to supine - was able to assist with lifting Lt LE onto the bed.  She was very vocal during session, but much of her verbalizations were word salad.  But, she was able to state "full" when attempting to have her eat more, and was able to relay that she misses her dog.  Self feeding goal added.  She would greatly benefit from a daily, intensive post acute rehab program with a therapy team who specializes in stroke rehab to allow her maximal potential to regain as much function and independence as possible.  Will continue to follow.   Follow Up Recommendations  CIR    Equipment Recommendations  3 in 1 bedside commode    Recommendations for Other Services      Precautions / Restrictions  Precautions Precautions: Fall Precaution Comments: abdominal binder; peg Required Braces or Orthoses: Other Brace Other Brace: L PRAFO; Lt resting hand splint       Mobility Bed Mobility Overal bed mobility: Needs Assistance Bed Mobility: Supine to Sit     Supine to sit: Mod assist Sit to supine: Min assist   General bed mobility comments: Pt initially required assist to initiate movement, but then she was able to help move her Rt LE off the bed and assisted with lifting trunk to move to EOB sitting.  Upon return to supine, she required multi modal cuing to initiate task, but then lowered her trunk to the bed, lifted her Rt leg, and was able to lift Lt leg with min A and moderate encouragment to keep trying to lift it.  She was able to pull self to the [facility] with min A    Transfers                      Balance Overall balance assessment: Needs assistance Sitting-balance support: Feet supported;Single extremity supported;No upper extremity supported Sitting balance-Leahy Scale: Fair Sitting balance - Comments: maintained EOB sitting with min guard assist to self feed  ADL either performed or assessed with clinical judgement   ADL Overall ADL's : Needs assistance/impaired Eating/Feeding: Maximal assistance;Sitting Eating/Feeding Details (indicate cue type and reason): Pt does not appear to recognize food.  when food placed in her mouth, she was initially resistive, but progressed to spontaneously opening her mouth, and at times feeding herself finger foods for a few bites, and spontaneously fed herself  3 bites of watermelon via fork,  After prompting and hand over hand assist, pt spontaneously was able to drink from soda can Grooming: Wash/dry face;Minimal assistance Grooming Details (indicate cue type and reason): Pt spontaneously wiped mouth and chin                                     Vision   Vision  Assessment?: Vision impaired- to be further tested in functional context Additional Comments: Pt spontaneously looking to Lt today, looking at therapist on her lt, and out the windows on her left   Perception     Praxis      Cognition Arousal/Alertness: Awake/alert Behavior During Therapy: Restless (labile) Overall Cognitive Status: Impaired/Different from baseline Area of Impairment: Attention;Following commands;Problem solving                   Current Attention Level: Sustained   Following Commands: Follows one step commands inconsistently;Follows one step commands with increased time     Problem Solving: Decreased initiation;Difficulty sequencing;Requires verbal cues;Requires tactile cues General Comments: Pt talking throughout session - mostly word salad, but at times able to express herself minimally.  She was able to state "full" when therapist attempted to get her to eat more of her meal. She spoke about her dog repeatedly.  she was able to follow command to fist bump x 2.  She followed multi modal cues to return to bed.  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  However, after repeatedly feeding her, she would spontaneously feed herself a bite or two of finger foods, and was able to feed herself 3 bites of food via fork.  She has been using call button spontaneously.  She was interacting with her environment today, moving her covers, looking out the window, engaging with her nurse and other staff.  She lifted her covers x 3 to look for and touch her Lt hand        Exercises     Shoulder Instructions       General Comments      Pertinent Vitals/ Pain       Pain Assessment: Faces Faces Pain Scale: Hurts a little bit  Home Living                                          Prior Functioning/Environment              Frequency  Min 3X/week        Progress Toward  Goals  OT Goals(current goals can now be found in the care plan section)  Progress towards OT goals: Progressing toward goals (feeding goal added)  Acute Rehab OT Goals OT Goal Formulation: With patient Time For Goal Achievement: 05/25/21 Potential to Achieve Goals: Good ADL Goals Pt Will Perform Eating: with mod assist;sitting  Plan Discharge plan remains  appropriate    Co-evaluation                 AM-PAC OT "6 Clicks" Daily Activity     Outcome Measure   Help from another person eating meals?: A Lot Help from another person taking care of personal grooming?: A Lot Help from another person toileting, which includes using toliet, bedpan, or urinal?: A Lot Help from another person bathing (including washing, rinsing, drying)?: Total Help from another person to put on and taking off regular upper body clothing?: A Lot Help from another person to put on and taking off regular lower body clothing?: Total 6 Click Score: 10    End of Session    OT Visit Diagnosis: Unsteadiness on feet (R26.81) Symptoms and signs involving cognitive functions: Cerebral infarction Hemiplegia - Right/Left: Left Hemiplegia - dominant/non-dominant: Non-Dominant Hemiplegia - caused by: Cerebral infarction Pain - Right/Left: Right   Activity Tolerance Patient tolerated treatment well   Patient Left in bed;with call bell/phone within reach;with bed alarm set   Nurse Communication Mobility status        Time: 4166-0630 OT Time Calculation (min): 67 min  Charges: OT General Charges $OT Visit: 1 Visit OT Treatments $Self Care/Home Management : 53-67 mins  Eber Jones., OTR/L Acute Rehabilitation Services Pager [phone] Office [phone]    Jeani Hawking M 05/11/2021, 5:24 PM

## 2021-05-12 DIAGNOSIS — I6601 Occlusion and stenosis of right middle cerebral artery: Secondary | ICD-10-CM | POA: Diagnosis not present

## 2021-05-12 DIAGNOSIS — R000 Tachycardia, unspecified: Secondary | ICD-10-CM | POA: Diagnosis not present

## 2021-05-12 DIAGNOSIS — I63511 Cerebral infarction due to unspecified occlusion or stenosis of right middle cerebral artery: Secondary | ICD-10-CM | POA: Diagnosis not present

## 2021-05-12 DIAGNOSIS — I6340 Cerebral infarction due to embolism of unspecified cerebral artery: Secondary | ICD-10-CM | POA: Diagnosis not present

## 2021-05-12 LAB — GLUCOSE, CAPILLARY
Glucose-Capillary: 100 mg/dL — ABNORMAL HIGH (ref 70–99)
Glucose-Capillary: 72 mg/dL (ref 70–99)
Glucose-Capillary: 86 mg/dL (ref 70–99)
Glucose-Capillary: 88 mg/dL (ref 70–99)
Glucose-Capillary: 94 mg/dL (ref 70–99)
Glucose-Capillary: 96 mg/dL (ref 70–99)

## 2021-05-12 MED ORDER — ATENOLOL 25 MG PO TABS
50.0000 mg | ORAL_TABLET | Freq: Every day | ORAL | Status: DC
  Administered 2021-05-12 – 2021-05-13 (×2): 50 mg via ORAL
  Filled 2021-05-12 (×2): qty 2

## 2021-05-12 MED ORDER — METOPROLOL TARTRATE 25 MG/10 ML ORAL SUSPENSION: 12.5000 mg | Freq: Two times a day (BID) | ORAL | Status: DC

## 2021-05-12 MED ORDER — OSMOLITE 1.5 CAL PO LIQD
1000.0000 mL | ORAL | Status: DC
  Administered 2021-05-12 – 2021-05-15 (×5): 1000 mL
  Filled 2021-05-12: qty 1000

## 2021-05-12 NOTE — Progress Notes (Signed)
Physical Therapy Treatment Patient Details Name: Krista Maddox MRN: [id number] DOB: 04-03-1986 Today's Date: 05/12/2021    History of Present Illness The pt is a 35 yo female presenting with L sided facial droop and L sided weakness on 4/21. Underwent angiogram and revascularization on 4/21. CT head showing large right MCA territory infarct with 4 mm leftward midline shift. S/p PEG 5/5. 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    PT Comments    Pt making progress towards her physical therapy goals, with improved left sided attention today, static/dynamic sitting balance and initiating transitions to standing. Pt initially emotionally labile upon entrance, but smiling when handed her stuffed animal. Requiring two person mod-max assist for functional mobility. Able to stand from edge of bed successfully x 2 with left knee blocked. Removed bed rail from end of bed as pt frequently tries to rotate and reach for it at the last minute, thus decreasing anterior weight shift. Attempted self feeding, but pt shaking head and did not seem interested. Will continue to progress as tolerated.    Follow Up Recommendations  Other (comment);Supervision/Assistance - 24 hour (Inpatient Rehab with [facility] rehab team)     Equipment Recommendations  Wheelchair (measurements PT);Wheelchair cushion (measurements PT);Hospital bed (hoyer lift)    Recommendations for Other Services       Precautions / Restrictions Precautions Precautions: Fall Precaution Comments: abdominal binder; peg Required Braces or Orthoses: Other Brace Other Brace: L PRAFO; Lt resting hand splint Restrictions Weight Bearing Restrictions: No    Mobility  Bed Mobility Overal bed mobility: Needs Assistance Bed Mobility: Supine to Sit;Sit to Supine     Supine to sit: Max assist;HOB elevated;+2 for physical assistance Sit to supine: +2 for physical assistance;Mod assist    General bed mobility comments: Pt with decreased initiation for supine > sit, requiring maxA + 2, had RUE placed on rail but did not use it functionally to push up. Pt helping return self to bed, bringing RLE up    Transfers Overall transfer level: Needs assistance Equipment used:  (chair) Transfers: Sit to/from Stand Sit to Stand: +2 physical assistance;Max assist         General transfer comment: Removed bed rail from bottom of bed. x3 trials of standing, pt successful on last 2. Benefits from rocking to gain momentum and counting to 3. Left knee blocked and able to achieve completely upright on last attempt with heavy left lateral lean. Cues for looking up towards window  Ambulation/Gait                 Stairs             Wheelchair Mobility    Modified Rankin (Stroke Patients Only) Modified Rankin (Stroke Patients Only) Pre-Morbid Rankin Score: Moderate disability Modified Rankin: Severe disability     Balance Overall balance assessment: Needs assistance Sitting-balance support: Feet supported;Single extremity supported;No upper extremity supported Sitting balance-Leahy Scale: Fair Sitting balance - Comments: sitting EOB during session with close supervision with moments of no UE support and moments of RUE supported on end of bed.   Standing balance support: Single extremity supported Standing balance-Leahy Scale: Poor                              Cognition Arousal/Alertness: Awake/alert Behavior During Therapy: Restless Overall Cognitive Status: Impaired/Different from baseline Area of Impairment: Attention;Following commands;Awareness;Problem solving;Safety/judgement  Current Attention Level: Focused   Following Commands: Follows one step commands inconsistently;Follows one step commands with increased time Safety/Judgement: Decreased awareness of safety;Decreased awareness of deficits Awareness:  Emergent Problem Solving: Slow processing;Decreased initiation;Difficulty sequencing;Requires verbal cues;Requires tactile cues General Comments: Pt emotionally labile upon entrance, stating the words "dad" and "brother," but otherwise mainly word salad. Pt attending to objects on both right and left side. smiling when handed the hippo and touching her hand to its nose. Slow processing speed and following 1 step commands inconsistently      Exercises      General Comments        Pertinent Vitals/Pain Pain Assessment: Faces Faces Pain Scale: No hurt    Home Living                      Prior Function            PT Goals (current goals can now be found in the care plan section) Acute Rehab PT Goals Patient Stated Goal: pt family would like her to be able to go home PT Goal Formulation: Patient unable to participate in goal setting Time For Goal Achievement: 05/25/21 Potential to Achieve Goals: Good Progress towards PT goals: Progressing toward goals    Frequency    Min 4X/week      PT Plan Current plan remains appropriate    Co-evaluation              AM-PAC PT "6 Clicks" Mobility   Outcome Measure  Help needed turning from your back to your side while in a flat bed without using bedrails?: Total Help needed moving from lying on your back to sitting on the side of a flat bed without using bedrails?: Total Help needed moving to and from a bed to a chair (including a wheelchair)?: Total Help needed standing up from a chair using your arms (e.g., wheelchair or bedside chair)?: Total Help needed to walk in hospital room?: Total Help needed climbing 3-5 steps with a railing? : Total 6 Click Score: 6    End of Session Equipment Utilized During Treatment: Gait belt Activity Tolerance: Patient tolerated treatment well Patient left: in bed;with call bell/phone within reach;with bed alarm set;with SCD's reapplied Nurse Communication: Mobility status PT  Visit Diagnosis: Hemiplegia and hemiparesis;Other abnormalities of gait and mobility (R26.89);Difficulty in walking, not elsewhere classified (R26.2);Other symptoms and signs involving the nervous system (R29.898);Muscle weakness (generalized) (M62.81) Hemiplegia - Right/Left: Left Hemiplegia - dominant/non-dominant: Non-dominant Hemiplegia - caused by: Cerebral infarction     Time: 5456-2563 PT Time Calculation (min) (ACUTE ONLY): 35 min  Charges:  $Therapeutic Activity: 23-37 mins                     Lillia Pauls, PT, DPT Acute Rehabilitation Services Pager [phone] Office [phone]    Norval Morton 05/12/2021, 3:47 PM

## 2021-05-12 NOTE — Progress Notes (Addendum)
STROKE TEAM PROGRESS NOTE   INTERVAL HISTORY No acute events.  Afebrile.  VSS besides mild tachycardia.  Initially smiling when I walked into the room then she became tearful during the neuro exam.  Patient appears frustrated about not being able to communicate effectively.  She is very dysarthric and her voice appears stronger. No other change in neuro exam.  Still has left hemiplegia     Awaiting inpatient rehab placement with [facility] team.   Continue Coumadin 7.5mg  daily except 10mg  on MWF INR on Mon and Thurs only   Vitals:   05/11/21 2304 05/12/21 0359 05/12/21 0429 05/12/21 0700  BP: 117/82 (!) 125/103  129/84  Pulse: (!) 103 (!) 108    Resp: 20 20    Temp: 98.7 F (37.1 C) 98.4 F (36.9 C)  98.8 F (37.1 C)  TempSrc: Axillary Oral  Oral  SpO2: 94% 98%    Weight:   111 kg   Height:       CBC:  Recent Labs  Lab 05/06/21 0455 05/11/21 0440  WBC 8.0 8.4  HGB 11.1* 11.6*  HCT 35.5* 37.6  MCV 92.2 91.5  PLT 257 252   Basic Metabolic Panel:  Recent Labs  Lab 05/06/21 0455 05/11/21 0440  NA 138 137  K 3.5 3.3*  CL 106 107  CO2 25 24  GLUCOSE 93 102*  BUN 18 16  CREATININE 0.71 0.62  CALCIUM 8.9 8.8*   IMAGING past 24 hours No results found.  PHYSICAL EXAM  Temp:  [98 F (36.7 C)-98.8 F (37.1 C)] 98.8 F (37.1 C) (05/31 0700) Pulse Rate:  [92-110] 108 (05/31 0359) Resp:  [18-20] 20 (05/31 0359) BP: (117-130)/(82-103) 129/84 (05/31 0700) SpO2:  [94 %-98 %] 98 % (05/31 0359) Weight:  12-26-2003 kg] 111 kg (05/31 0429)  General - morbid obesity, well developed, in no apparent distress.  Resp: No extra work of breathing.  Cardiovascular - Regular rhythm and rate. Abd: soft, round, NTTP, Non-distended  Ext: warm and well perfused  Neuro - Eyes open, awake alert, does not answer any orientation questions today,mimics with right hand, waves, points with her index finger, and shows 2 fingers.   Right gaze preference, but able to cross  midline and left gaze incomplete.  Blinking to visual threat bilaterally. Left facial droop. Left upper extremity 0/5 but increased muscle tone, left LE slight withdraw to pain but also with increased muscle tone.Right upper extremity spontaneous movement against gravity. Right lower extremity at least 3/5 spontaneously. Sensation, coordination and gait not able to be tested.  ASSESSMENT/PLAN [name] is a 35 y.o. female with history of multiple strokes who presented as a code stroke with symptoms suggestive of LVO. tPA was not given due to Eliquis. CTA head and neck revealed an occlusion of distal right M2 MCA with partial reconstitution and subsequent reocclusion of most M2 branches. Patient underwent thrombectomy.    Recurrent cryptogenic embolic strokes - Acute infarct of the R MCA due to right M1 occlusion s/p IR with TICI2c, cryptogenic etiology   Code Stroke: CT head No acute intracranial hemorrhage. Acute infarcts in the right MCA territory. ASPECTS 8.    CTA head & neck Occlusion of distal right M1 MCA with partial reconstitution and subsequent reocclusion of most M2 branches. Poor collaterals.  CT Perfusion imaging demonstrates core infarction 99 mL and penumbra of 34 mL. The extent of core infarction is calculated at greater than apparent loss of gray-white differentiation on noncontrast  head CT. Therefore, penumbra may be greater.  2D Echo LVEF 60-65%, no structural abnormalities noted that could increase risk for CVA  Repeat CT Head 4/24 - large left MCA infarct, no midline shift. Small SAH sylvian fissure, diminishing in size  Hypercoagulable panel: Homocysteine 15.1 due to smoking, otherwise negative to date  LDL 48  HgbA1c 5.7  VTE prophylaxis - SCDs + Lovenox  aspirin 325 mg daily and Eliquis (apixaban) daily prior to admission, now on ASA 325 and coumadin (pharmacy managed) with INR goal 2.5-3.5.  INR 3.0->2.8  Therapy recommendations:   SNF  Disposition:  SNF pending  Hx of cryptogenic strokes  In 08/2017 patient admitted for left sided numbness and headache.  MRI showed right cerebellum, right temporoparietal small infarcts.  EF 55 to 60%, CT head and neck unremarkable.  LDL 89, A1c 4.8.  TCD bubble study showed trivial PFO.  DVT negative.  TEE no PFO.  Hypercoagulable and autoimmune work-up negative.  Discharged with aspirin and Lipitor.  01/2018 episode of left facial numbness and left leg weakness.  CT head and neck unremarkable.  MRI negative.  06/2018 admitted to [facility] for right hand/face numbness.  MRI showed right tiny parietal infarct.  10/2020 admitted to [facility] for aphasia status post tPA.  CTA neck showed left M2 occlusion.  MRI showed left MCA and punctate right frontal infarct.  TCD bubble study potential trivial PFO, TEE tiny PFO.  11/2020 she was referred to loop recorder which was not done as physician thought she should be candidate for anticoagulation.  Started on Eliquis 5 mg twice daily.  Skin rash with blisters, resolved  Left flank skin lesion with blisters, concerning for shingles  Varicella zoster PCR negative, spoke with IC RN and precautions discontinued 5/6.   Off contact and airborne isolation; resolved  Valtrex discontinued   Hyperlipidemia  Home meds:  atrovastatin 80mg  and zetia 10mg    Will restart post CT if stable  LDL 48, goal < 70  Resumed lipitor 80 and zetia 10  Continue statin at discharge   Dysphagia   SLP and RD following   Now on Regular thin liquid  Low po intake, still on TF  S/p PEG    May consider trial of nocturnal feedings   Birth control  Recommend OP PCP or OBGYN discontinue Depo - provera due to numerous Contraindications including hx of tobacco use, migraines, and multiple strokes.    Tobacco abuse  Current smoker  Smoking cessation counseling will be provided  Contraindicated for birth control Depo   Hx of pseudotumor cerebri Chronic  migraine  Follows with Dr. 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   Depression  Depressed mood after father and daughter left  Has increase Effexor to 150mg  bid  Other Stroke Risk Factors  Morbid Obesity, Body mass index is 42.12 kg/m., BMI >/= 30 associated with increased stroke risk, recommend weight loss, diet and exercise as appropriate   Obstructive sleep apnea   Tachycardia - since family visit over the weekend, will restart her home atenolol 50mg  daily  Hospital day # 40   Lissy Olivencia-Simmons, ACNP-BC 05/12/2021 2:03 PM  ATTENDING NOTE: I reviewed above note and agree with the assessment and plan. Pt was seen and examined.   No acute event overnight, neuro stable unchanged.  However still has intermittent tachycardia, will resume home atenolol.  Pending SNF.  For detailed assessment and plan, please  refer to above as I have made changes wherever appropriate.   Marvel Plan, MD PhD Stroke Neurology 05/12/2021 7:45 PM       To contact Stroke Continuity provider, please refer to [email or web address]. After hours, contact General Neurology

## 2021-05-12 NOTE — TOC Progression Note (Signed)
Transition of Care [facility]) - Progression Note    Patient Details  Name: Krista Maddox MRN: [id number] Date of Birth: December 24, 1985  Transition of Care [facility]) CM/SW Contact  Glennon Mac, RN Phone Number: 05/12/2021, 11:51 AM  Clinical Narrative:   Noted patient progression with therapies and CIR; left message for Tammy Sizemore with [facility] (phone: [phone]), requesting update.  Will follow with updates as available.      Expected Discharge Plan: Skilled Nursing Facility Barriers to Discharge: Continued Medical Work up,Insurance Authorization  Expected Discharge Plan and Services Expected Discharge Plan: Skilled Nursing Facility       Living arrangements for the past 2 months: Single Family Home                                       Social Determinants of Health (SDOH) Interventions    Readmission Risk Interventions No flowsheet data found.  Quintella Baton, RN, BSN  Trauma/Neuro ICU Case Manager [phone]

## 2021-05-12 NOTE — Progress Notes (Signed)
ANTICOAGULATION CONSULT NOTE  Pharmacy Consult:  Coumadin Indication:  Acute stroke  Allergies  Allergen Reactions  . Tape Rash    Cannot tolerate paper tape    Patient Measurements: Height: 5\' 5"  (165.1 cm) Weight: 111 kg (244 lb 11.4 oz) IBW/kg (Calculated) : 57   Vital Signs: Temp: 98.8 F (37.1 C) (05/31 0700) Temp Source: Oral (05/31 0700) BP: 129/84 (05/31 0700) Pulse Rate: 108 (05/31 0359)  Labs: Recent Labs    05/11/21 0440  HGB 11.6*  HCT 37.6  PLT 252  LABPROT 29.7*  INR 2.8*  CREATININE 0.62    Estimated Creatinine Clearance: 122.9 mL/min (by C-G formula based on SCr of 0.62 mg/dL).  Assessment: 35 y.o female on apixaban PTA for hx prior strokes, ?DVT.  Last dose PTA " unknown," admitted 04/02/21 with acute CVA.  No TPA due to apixaban pta.  Underwent thrombectomy on 5/21.  Pharmacy consulted for Coumadin dosing.  INR therapeutic and stable on 5/30, next check due on 6/2. No bleeding noted.   Goal of Therapy:  INR 2.5-3.5 per Neurology Monitor platelets by anticoagulation protocol: Yes   Plan:  Continue Coumadin 7.5mg  daily except 10mg  on MWF INR on Mon and Thurs only  Thank you for allowing pharmacy to be a part of this patient's care.  , PharmD, BCPS Clinical Pharmacist Clinical phone for 05/12/2021: [phone] 05/12/2021 12:53 PM   **Pharmacist phone directory can now be found on [email or web address] ([email or web address]).  Listed under [facility].

## 2021-05-12 NOTE — Progress Notes (Signed)
Nutrition Follow-up  DOCUMENTATION CODES:   Obesity unspecified  INTERVENTION:   - RD to order meals  - Encourage adequate PO intake and provide feeding assistance  Transition to nocturnal tube feeds via PEG: - Osmolite 1.5 @ 75 ml/hr to run for 12 hours from 1800 to 0600 (total of 900 ml) - ProSource TF 90 ml BID - Free water flushes of 150 ml q 4 hours  Nocturnal tube feeding regimen provides 1510 kcal, 100 grams of protein, and 686 ml of H2O (meets 79% of kcal needs and 91% of protein needs).  Total free water with flushes: 1586 ml  NUTRITION DIAGNOSIS:   Inadequate oral intake related to inability to eat as evidenced by NPO status.  Progressing, pt now on Regular diet  GOAL:   Patient will meet greater than or equal to 90% of their needs  Progressing  MONITOR:   PO intake,Labs,Weight trends,TF tolerance,I & O's  REASON FOR ASSESSMENT:   Consult Enteral/tube feeding initiation and management  ASSESSMENT:   Pt with PMH of prior strokes x 4 with mild residual aphasia and L sided numbness, chronic MHAs, HLD, TTP duing pregnancy admitted for R gaze preference, L sided weakness, aphasia and L facial droop with R MCA stroke s.p thrombectomy.  4/22-Cortrak placed (gastric tip) 4/27-diet advanced to dysphagia 1 with thin liquids 5/05 -Cortrak removed,PEG placement 5/26 - trial of Regular diet initiated  Pt is awaiting SNF placement.  Discussed pt with SLP. Plan is for pt to start receiving 3 meals daily. RD will transition pt to nocturnal tube feeds.  Also spoke with RN. Per RN and SLP, pt requires finger foods on her meal trays. RD to order meals to ensure that pt receives finger foods.  Per RN, pt ate well at dinner meal yesterday. Family brought in chicken nuggets, watermelon, and fries. Pt ate everything except the fries which were cold.  Admit weight: 114.8 kg Current weight:111 kg  Current TF: Osmolite 1.5 @ 50 ml/hr, ProSource TF 90 ml BID, free  water flushes of 150 ml q 4 hours  Meal Completion: 0-20%  Medications reviewed and include: protonix, miralax, senna, warfarin  Labs reviewed: potassium 3.3 on 5/30 CBG's: 88-100 x 24 hours  UOP: 1125 ml x 24 hours  Diet Order:   Diet Order            Diet regular Room service appropriate? No; Fluid consistency: Thin  Diet effective now                 EDUCATION NEEDS:   No education needs have been identified at this time  Skin:  Skin Assessment: Reviewed RN Assessment  Last BM:  05/11/21  Height:   Ht Readings from Last 1 Encounters:  04/02/21 5\' 5"  (1.651 m)    Weight:   Wt Readings from Last 1 Encounters:  05/12/21 111 kg    Ideal Body Weight:  56.8 kg  BMI:  Body mass index is 40.72 kg/m.  Estimated Nutritional Needs:   Kcal:  1900-2100  Protein:  110-125 grams  Fluid:  > 2 L/day    05/14/21, MS, RD, LDN Inpatient Clinical Dietitian Please see AMiON for contact information.

## 2021-05-13 DIAGNOSIS — I63511 Cerebral infarction due to unspecified occlusion or stenosis of right middle cerebral artery: Secondary | ICD-10-CM | POA: Diagnosis not present

## 2021-05-13 LAB — GLUCOSE, CAPILLARY
Glucose-Capillary: 101 mg/dL — ABNORMAL HIGH (ref 70–99)
Glucose-Capillary: 104 mg/dL — ABNORMAL HIGH (ref 70–99)
Glucose-Capillary: 94 mg/dL (ref 70–99)
Glucose-Capillary: 91 mg/dL (ref 70–99)
Glucose-Capillary: 113 mg/dL — ABNORMAL HIGH (ref 70–99)
Glucose-Capillary: 80 mg/dL (ref 70–99)

## 2021-05-13 MED ORDER — ATENOLOL 25 MG PO TABS
25.0000 mg | ORAL_TABLET | Freq: Every day | ORAL | Status: DC
  Administered 2021-05-14 – 2021-05-15 (×2): 25 mg via ORAL
  Filled 2021-05-13 (×2): qty 1

## 2021-05-13 NOTE — Progress Notes (Signed)
ANTICOAGULATION CONSULT NOTE  Pharmacy Consult:  Coumadin Indication:  Acute stroke  Allergies  Allergen Reactions  . Tape Rash    Cannot tolerate paper tape    Patient Measurements: Height: 5\' 5"  (165.1 cm) Weight: 111 kg (244 lb 11.4 oz) IBW/kg (Calculated) : 57   Vital Signs: Temp: 98.8 F (37.1 C) (06/01 1115) Temp Source: Oral (06/01 1115) BP: 108/70 (06/01 1115) Pulse Rate: 88 (06/01 1115)  Labs: Recent Labs    05/11/21 0440  HGB 11.6*  HCT 37.6  PLT 252  LABPROT 29.7*  INR 2.8*  CREATININE 0.62    Estimated Creatinine Clearance: 122.9 mL/min (by C-G formula based on SCr of 0.62 mg/dL).  Assessment: 35 y.o female on apixaban PTA for hx prior strokes, ?DVT.  Last dose PTA " unknown," admitted 04/02/21 with acute CVA.  No TPA due to apixaban pta.  Underwent thrombectomy on 5/21.  Pharmacy consulted for Coumadin dosing.  INR therapeutic and stable on 5/30, next check due on 6/2. No bleeding noted.    Goal of Therapy:  INR 2.5-3.5 per Neurology Monitor platelets by anticoagulation protocol: Yes   Plan:  Continue Coumadin 7.5mg  daily except 10mg  on MWF INR on Mon and Thurs only  Thank you for allowing pharmacy to be a part of this patient's care.  , PharmD, BCPS Clinical Pharmacist Clinical phone for 05/13/2021: [phone] 05/13/2021 2:56 PM   **Pharmacist phone directory can now be found on [email or web address] ([email or web address]).  Listed under [facility].

## 2021-05-13 NOTE — Progress Notes (Addendum)
STROKE TEAM PROGRESS NOTE   INTERVAL HISTORY No acute events.  Afebrile.  VSS. C/o " I'm cold" and smiles at me today. She arranged her blanket herself. Speech is incomprehensible and rapid but occasional words are comprehensible.   Awaiting inpatient rehab placement with [facility] specialty rehab team.    Vitals:   05/13/21 0300 05/13/21 0500 05/13/21 0720 05/13/21 1115  BP: 109/79  105/71 108/70  Pulse: 75  87 88  Resp: 19  20 18   Temp: 98.7 F (37.1 C)  98.6 F (37 C) 98.8 F (37.1 C)  TempSrc: Axillary  Axillary Oral  SpO2: 97%  96% 97%  Weight:  111 kg    Height:       CBC:  Recent Labs  Lab 05/11/21 0440  WBC 8.4  HGB 11.6*  HCT 37.6  MCV 91.5  PLT 252   Basic Metabolic Panel:  Recent Labs  Lab 05/11/21 0440  NA 137  K 3.3*  CL 107  CO2 24  GLUCOSE 102*  BUN 16  CREATININE 0.62  CALCIUM 8.8*   IMAGING past 24 hours No results found.  PHYSICAL EXAM  Temp:  [97.8 F (36.6 C)-99.1 F (37.3 C)] 98.8 F (37.1 C) (06/01 1115) Pulse Rate:  [73-88] 88 (06/01 1115) Resp:  [17-20] 18 (06/01 1115) BP: (105-123)/(70-86) 108/70 (06/01 1115) SpO2:  [96 %-100 %] 97 % (06/01 1115) Weight:  08-26-1988 kg] 111 kg (06/01 0500)  General - morbid obesity, well developed, in no apparent distress.  Resp: No extra work of breathing.  Cardiovascular - Regular rhythm and rate. Abd: soft, round, NTTP, Non-distended  Ext: warm and well perfused  Neuro - Eyes open, awake alert, does not answer any orientation questions today,mimics with right hand, waves, points with her index finger, and shows 2 fingers.   Right gaze preference, but able to cross midline and left gaze incomplete.  Blinking to visual threat bilaterally. Left facial droop. Left upper extremity 0/5 but increased muscle tone, left LE slight withdraw to pain but also with increased muscle tone.Right upper extremity spontaneous movement against gravity. Right lower extremity at least 3/5 spontaneously.  Sensation, coordination and gait not able to be tested.  ASSESSMENT/PLAN [name] is a 35 y.o. female with history of multiple strokes who presented as a code stroke with symptoms suggestive of LVO. tPA was not given due to Krista Maddox. CTA head and neck revealed an occlusion of distal right M2 MCA with partial reconstitution and subsequent reocclusion of most M2 branches. Patient underwent thrombectomy.    Recurrent cryptogenic embolic strokes - Acute infarct of the R MCA due to right M1 occlusion s/p IR with Krista Maddox, cryptogenic etiology   Code Stroke: CT head No acute intracranial hemorrhage. Acute infarcts in the right MCA territory. ASPECTS 8.    CTA head & neck Occlusion of distal right M1 MCA with partial reconstitution and subsequent reocclusion of most M2 branches. Poor collaterals.  CT Perfusion imaging demonstrates core infarction 99 mL and penumbra of 34 mL. The extent of core infarction is calculated at greater than apparent loss of gray-white differentiation on noncontrast head CT. Therefore, penumbra may be greater.  2D Echo LVEF 60-65%, no structural abnormalities noted that could increase risk for CVA  Repeat CT Head 4/24 - large left MCA infarct, no midline shift. Small SAH sylvian fissure, diminishing in size  Hypercoagulable panel: Homocysteine 15.1 due to smoking, otherwise negative to date  LDL 48  HgbA1c 5.7  VTE prophylaxis - SCDs +  Lovenox  aspirin 325 mg daily and Krista Maddox (apixaban) daily prior to admission, now on ASA 325 and coumadin (pharmacy managed) with INR goal 2.5-3.5.  INR 3.0->2.8  Therapy recommendations:  SNF  Disposition:  SNF pending  Hx of cryptogenic strokes  In 08/2017 patient admitted for left sided numbness and headache.  MRI showed right cerebellum, right temporoparietal small infarcts.  EF 55 to 60%, CT head and neck unremarkable.  LDL 89, A1c 4.8.  TCD bubble study showed trivial PFO.  DVT negative.  TEE no PFO.   Hypercoagulable and autoimmune work-up negative.  Discharged with aspirin and Lipitor.  01/2018 episode of left facial numbness and left leg weakness.  CT head and neck unremarkable.  MRI negative.  06/2018 admitted to [facility] for right hand/face numbness.  MRI showed right tiny parietal infarct.  10/2020 admitted to [facility] for aphasia status post tPA.  CTA neck showed left M2 occlusion.  MRI showed left MCA and punctate right frontal infarct.  TCD bubble study potential trivial PFO, TEE tiny PFO.  11/2020 she was referred to loop recorder which was not done as physician thought she should be candidate for anticoagulation.  Started on Krista Maddox 5 mg twice daily.  Skin rash with blisters, resolved  Left flank skin lesion with blisters, concerning for shingles  Varicella zoster PCR negative, spoke with IC RN and precautions discontinued 5/6.   Off contact and airborne isolation; resolved  Krista Maddox discontinued   Hyperlipidemia  Home meds:  atrovastatin 80mg  and zetia 10mg    Will restart post CT if stable  LDL 48, goal < 70  Resumed lipitor 80 and zetia 10  Continue statin at discharge   Dysphagia   SLP and RD following   Now on Regular thin liquid  Low po intake, still on TF  S/p PEG    May consider trial of nocturnal feedings   Birth control  Recommend OP PCP or OBGYN discontinue Krista Maddox due to numerous Contraindications including hx of tobacco use, migraines, and multiple strokes.    Tobacco abuse  Current smoker  Smoking cessation counseling will be provided  Contraindicated for birth control Krista   Hx of pseudotumor cerebri Chronic migraine  Follows with Dr. 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   Depression  Depressed mood after father and daughter left  Has increase Effexor to 150mg  bid  Other Stroke Risk Factors  Morbid Obesity, Body mass index is  42.12 kg/m., BMI >/= 30 associated with increased stroke risk, recommend weight loss, diet and exercise as appropriate   Obstructive sleep apnea   Tachycardia - since family visit over the weekend, will restart her home atenolol 50mg  daily  Hospital day # 41   Krista A Bailey-Modzik, NP-C  ATTENDING NOTE: I reviewed above note and agree with the assessment and plan. Pt was seen and examined.   No acute event overnight, patient neuro stable, unchanged.  Still pending SNF.  CIR was reconsulted, but not feel appropriate candidate at this time.  For detailed assessment and plan, please refer to above as I have made changes wherever appropriate.   [facility], MD PhD Stroke Neurology 05/13/2021 8:51 PM     To contact Stroke Continuity provider, please refer to 11/2017. After hours, contact General Neurology

## 2021-05-13 NOTE — Progress Notes (Signed)
  Speech Language Pathology Treatment: Dysphagia;Cognitive-Linquistic  Patient Details Name: Krista Maddox MRN: [id number] DOB: [birth year] Today's Date: 05/13/2021 Time: 6754-4920 SLP Time Calculation (min) (ACUTE ONLY): 27 min  Assessment / Plan / Recommendation Clinical Impression  Pt seen for dysphagia and cognitive-language intervention. She has been observed with meals and is receiving meals on regular schedule. Her oral and pharyngeal swallow continues to appear within normal limits, timely without concern for aspiration. Due to her bilateral frontal lobe impairments she needs significant and frequent cues to initiate eating. Sometimes she will take a sip or bite spontaneously but if therapist places food in hand she will typically initiate eating after a delay. Staff has been educated to this and she needs assist in positioning, tray set up. Staff does not need to stay with her the entire duration of meal but check on very frequently to ensure she is eating/drinking. Pt has met swallow goals and therapy is no longer skilled for swallow. Will continue to see for cognition-language but discharge for swallow.  She appropriately put tv on mute when therapist arrived. Attempted to elicit verbalizations and one phrase during session was "I'm sorry". There was no verbal or motor response to most commands or questions asked. Therapist plans to co-tx with PT or OT next session.    HPI HPI: Pt is a 35 y.o. female with a PMHx of prior strokes x 4 (right cerebellar and right posterior frontal infarctions in 2018 (while smoking and on oral contraceptives), as well as an acute occlusion of her left M2 on 11/09/2020 for which she was treated with thrombectomy at [facility] with sequelae of aphasia and left sided numbness), chronic MHAs, chronic AC with Eliquis, HLD, ? IIH, DDD, ITP during pregnancy, remote tobacco use, and disorder of optic nerve. Patient presented as a code stroke due to right gaze preference,  left sided weakness, aphasia and left facial droop. FE:OFHQR infarcts in the right MCA  territory; FXJ:OITGPQDIY of distal right M1 MCA with partial reconstitution and  subsequent reocclusion of most M2 branches. Poor collaterals. Pt s/p revascularization of occluded RT MCA. Cotrak placed 4/22      SLP Plan  All goals met;Discharge SLP treatment due to (comment)       Recommendations  Diet recommendations: Regular;Thin liquid Liquids provided via: Cup;Straw Medication Administration: Via alternative means Supervision: Patient able to self feed;Staff to assist with self feeding Compensations: Minimize environmental distractions;Slow rate;Small sips/bites Postural Changes and/or Swallow Maneuvers: Seated upright 90 degrees                Oral Care Recommendations: Oral care BID Follow up Recommendations: Skilled Nursing facility SLP Visit Diagnosis: Cognitive communication deficit (R41.841);Dysphagia, unspecified (R13.10) Plan: All goals met;Discharge SLP treatment due to (comment)                      Houston Siren 05/13/2021, 1:23 PM   Orbie Pyo Colvin Caroli.Ed Risk analyst [phone] Office [phone]

## 2021-05-13 NOTE — Progress Notes (Signed)
Occupational Therapy Treatment Patient Details Name: Krista Maddox MRN: [id number] DOB: 1986-08-10 Today's Date: 05/13/2021    History of present illness The pt is a 35 yo female presenting with L sided facial droop and L sided weakness on 4/21. Underwent angiogram and revascularization on 4/21. CT head showing large right MCA territory infarct with 4 mm leftward midline shift. S/p PEG 5/5. 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   OT comments  Pt seen in conjunction with PT to maximize participation. Pt making more efforts to attend to her L side with pt noted to actively reach to touch her L arm multiple times during session. Pt able to sit EOB with self feed bites of brownie with set- up assist of food items being placed in pts hand. Called pts daughter during session on Facetime with pt becoming emotionally liable, pt initially attentive to phone but then noted to lose interest. Attempted x2 sit<>stands with pt unable to come into full stand, attempted to block pts RUE during stand as pt reaching back to bed with RUE during each standing trial causing pt to lose momentum. Pt would continue to benefit from skilled occupational therapy while admitted and after d/c to address the below listed limitations in order to improve overall functional mobility and facilitate independence with BADL participation. DC plan remains appropriate, will follow acutely per POC.    Follow Up Recommendations  CIR    Equipment Recommendations  3 in 1 bedside commode    Recommendations for Other Services      Precautions / Restrictions Precautions Precautions: Fall Precaution Comments: abdominal binder; peg Required Braces or Orthoses: Other Brace Other Brace: L PRAFO; Lt resting hand splint Restrictions Weight Bearing Restrictions: No       Mobility Bed Mobility Overal bed mobility: Needs Assistance Bed Mobility: Rolling;Sidelying to Sit;Sit to  Sidelying Rolling: Max assist Sidelying to sit: Max assist;+2 for physical assistance     Sit to sidelying: Max assist;+2 for physical assistance General bed mobility comments: decreased initiation noted when exiting bed to pts L side, unable to maneuver RLE to EOB depsite max multimodal cues, pt ultimately requried MAXA  +2 to roll to pts L side and elevate trunk into sitting    Transfers Overall transfer level: Needs assistance Equipment used: 2 person hand held assist (face to face) Transfers: Sit to/from Stand Sit to Stand: Total assist;+2 physical assistance         General transfer comment: removed rail from end of bed with x2 attempts to stand with pt trying to reach back to bed with RUE each trial with pt unable to come into stand stating " i'm not ready" and resistant to assistance. Blocked LLE and used momentum to come into stand with little success noted    Balance Overall balance assessment: Needs assistance Sitting-balance support: Feet supported;Single extremity supported;No upper extremity supported Sitting balance-Leahy Scale: Fair Sitting balance - Comments: sitting EOB during session with close supervision     Standing balance-Leahy Scale: Zero Standing balance comment: unable to come into full stand                           ADL either performed or assessed with clinical judgement   ADL   Eating/Feeding: Set up;Sitting Eating/Feeding Details (indicate cue type and reason): pt intially needed total A for feeding but then progressing to set- up with food item placed in pts  hand, pt still does not appear to recognize food items                     Toilet Transfer: Total assistance;+2 for physical assistance Toilet Transfer Details (indicate cue type and reason): unable to stand x2 attempts with total A +2         Functional mobility during ADLs: Maximal assistance;+2 for physical assistance;+2 for safety/equipment;Total assistance (bed  mobility and attempted sit<>stand) General ADL Comments: pt reaching to L hand more and able to self feed this session     Vision   Additional Comments: Pt spontaneously looking to Lt today, continues to prefer to hold phone to R when facetiming with her daughter   Perception     Praxis      Cognition Arousal/Alertness: Awake/alert Behavior During Therapy: [facility] for tasks assessed/performed;Restless;Anxious (emotional when talking to her family on facetime) Overall Cognitive Status: Impaired/Different from baseline Area of Impairment: Attention;Following commands;Awareness;Problem solving;Safety/judgement                   Current Attention Level: Focused   Following Commands: Follows one step commands inconsistently;Follows one step commands with increased time Safety/Judgement: Decreased awareness of safety;Decreased awareness of deficits Awareness: Emergent Problem Solving: Slow processing;Decreased initiation;Difficulty sequencing;Requires verbal cues;Requires tactile cues General Comments: pt emotional when talking to her family on facetime, following commands inconsistently continuing to try to verbalize stating phrases such as " i dont like it out there" very poor attention to task this session very distracted by extraneous stimuli        Exercises     Shoulder Instructions       General Comments      Pertinent Vitals/ Pain       Pain Assessment: Faces Faces Pain Scale: Hurts a little bit Pain Location: generalized with mobility Pain Descriptors / Indicators: Discomfort;Grimacing Pain Intervention(s): Limited activity within patient's tolerance;Monitored during session;Repositioned  Home Living                                          Prior Functioning/Environment              Frequency  Min 3X/week        Progress Toward Goals  OT Goals(current goals can now be found in the care plan section)  Progress towards OT goals:  Progressing toward goals  Acute Rehab OT Goals Patient Stated Goal: none stated Time For Goal Achievement: 05/25/21 Potential to Achieve Goals: Good  Plan Discharge plan remains appropriate;Frequency remains appropriate    Co-evaluation      Reason for Co-Treatment: Complexity of the patient's impairments (multi-system involvement);For patient/therapist safety;To address functional/ADL transfers;Necessary to address cognition/behavior during functional activity   OT goals addressed during session: ADL's and self-care      AM-PAC OT "6 Clicks" Daily Activity     Outcome Measure   Help from another person eating meals?: A Little Help from another person taking care of personal grooming?: A Little Help from another person toileting, which includes using toliet, bedpan, or urinal?: A Lot Help from another person bathing (including washing, rinsing, drying)?: Total Help from another person to put on and taking off regular upper body clothing?: A Lot Help from another person to put on and taking off regular lower body clothing?: Total 6 Click Score: 12    End of Session Equipment Utilized During  Treatment: Gait belt  OT Visit Diagnosis: Unsteadiness on feet (R26.81) Symptoms and signs involving cognitive functions: Cerebral infarction Hemiplegia - Right/Left: Left Hemiplegia - dominant/non-dominant: Non-Dominant Hemiplegia - caused by: Cerebral infarction   Activity Tolerance Patient tolerated treatment well   Patient Left in bed;with call bell/phone within reach;with bed alarm set   Nurse Communication Mobility status        Time: 9379-0240 OT Time Calculation (min): 35 min  Charges: OT General Charges $OT Visit: 1 Visit OT Treatments $Self Care/Home Management : 8-22 mins  Lenor Derrick., COTA/L Acute Rehabilitation Services [phone]    Barron Schmid 05/13/2021, 4:14 PM

## 2021-05-13 NOTE — Progress Notes (Signed)
Inpatient Rehab Admissions Coordinator Note:   Per therapy recommendations, pt was re-screened for CIR candidacy by Estill Dooms, PT, DPT.  At this time pt does not appear to be making significant progress to support a CIR admission.  She has remained total +2 for out of bed mobility since last screen on 5/25.  With her [employer] Medicare, she would not be able to go to SNF following CIR, and we would expect a significant amount of assist needed following our short term program.  We can continue to follow for progress in the periphery, but would not recommend a consult at this time.  Please contact me with questions.   Estill Dooms, PT, DPT [phone] 05/13/21 5:41 PM

## 2021-05-13 NOTE — Progress Notes (Signed)
Physical Therapy Treatment Patient Details Name: Krista Maddox MRN: [id number] DOB: 17-Jun-1986 Today's Date: 05/13/2021    History of Present Illness The pt is a 35 yo female presenting with L sided facial droop and L sided weakness on 4/21. Underwent angiogram and revascularization on 4/21. CT head showing large right MCA territory infarct with 4 mm leftward midline shift. S/p PEG 5/5. 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    PT Comments    Pt demonstrates increased attention to left side with visual scanning and reaching down to touch arm multiple times during session. Utilized Facetime with pt daughter to promote engagement; pt initially attentive and emotionally labile, but eventually became distracted and lost interest. Pt requiring two person maximal-total assist for functional mobility. Decreased initiation of bed mobility and transitions to standing today. Took bed rail off end of bed, but pt frequently reaching back with RUE, thus losing anterior momentum. Pt stating, "I'm not ready." Will continue to progress mobility as tolerated.    Follow Up Recommendations  Supervision/Assistance - 24 hour (Inpatient Rehab with CVA specialty team)     Equipment Recommendations  Wheelchair (measurements PT);Wheelchair cushion (measurements PT);Hospital bed; [facility] lift   Recommendations for Other Services       Precautions / Restrictions Precautions Precautions: Fall Precaution Comments: abdominal binder; peg Required Braces or Orthoses: Other Brace Other Brace: L PRAFO; Lt resting hand splint Restrictions Weight Bearing Restrictions: No    Mobility  Bed Mobility Overal bed mobility: Needs Assistance Bed Mobility: Rolling;Sidelying to Sit;Sit to Sidelying Rolling: Max assist Sidelying to sit: Max assist;+2 for physical assistance     Sit to sidelying: Max assist;+2 for physical assistance General bed mobility comments:  decreased initiation noted when exiting bed to pts L side, unable to maneuver RLE to EOB despite max multimodal cues, pt ultimately requried MAXA  +2 to roll to pts L side and elevate trunk into sitting    Transfers Overall transfer level: Needs assistance Equipment used: 2 person hand held assist Transfers: Sit to/from Stand Sit to Stand: Total assist;+2 physical assistance         General transfer comment: removed rail from end of bed with x2 attempts to stand with pt trying to reach back to bed with RUE each trial with pt unable to come into stand stating " i'm not ready" and resistant to assistance. Blocked LLE and used momentum to come into stand with little success noted  Ambulation/Gait                 Stairs             Wheelchair Mobility    Modified Rankin (Stroke Patients Only) Modified Rankin (Stroke Patients Only) Pre-Morbid Rankin Score: Moderate disability Modified Rankin: Severe disability     Balance Overall balance assessment: Needs assistance Sitting-balance support: Feet supported;Single extremity supported;No upper extremity supported Sitting balance-Leahy Scale: Fair Sitting balance - Comments: sitting EOB during session with close supervision     Standing balance-Leahy Scale: Zero Standing balance comment: unable to come into full stand                            Cognition Arousal/Alertness: Awake/alert Behavior During Therapy: Restless;Anxious Overall Cognitive Status: Impaired/Different from baseline Area of Impairment: Attention;Following commands;Awareness;Problem solving;Safety/judgement                   Current Attention Level:  Focused   Following Commands: Follows one step commands inconsistently;Follows one step commands with increased time Safety/Judgement: Decreased awareness of safety;Decreased awareness of deficits Awareness: Emergent Problem Solving: Slow processing;Decreased initiation;Difficulty  sequencing;Requires verbal cues;Requires tactile cues General Comments: pt emotional when talking to her family on facetime, following commands inconsistently continuing to try to verbalize stating phrases such as " i dont like it out there" very poor attention to task this session very distracted by extraneous stimuli      Exercises      General Comments        Pertinent Vitals/Pain Pain Assessment: Faces Faces Pain Scale: No hurt Pain Location: generalized with mobility Pain Descriptors / Indicators: Discomfort;Grimacing Pain Intervention(s): Limited activity within patient's tolerance;Monitored during session;Repositioned    Home Living                      Prior Function            PT Goals (current goals can now be found in the care plan section) Acute Rehab PT Goals Patient Stated Goal: none stated Potential to Achieve Goals: Fair    Frequency    Min 4X/week      PT Plan Current plan remains appropriate    Co-evaluation PT/OT/SLP Co-Evaluation/Treatment: Yes Reason for Co-Treatment: Necessary to address cognition/behavior during functional activity;For patient/therapist safety;To address functional/ADL transfers PT goals addressed during session: Mobility/safety with mobility OT goals addressed during session: ADL's and self-care      AM-PAC PT "6 Clicks" Mobility   Outcome Measure  Help needed turning from your back to your side while in a flat bed without using bedrails?: A Lot Help needed moving from lying on your back to sitting on the side of a flat bed without using bedrails?: Total Help needed moving to and from a bed to a chair (including a wheelchair)?: Total Help needed standing up from a chair using your arms (e.g., wheelchair or bedside chair)?: Total Help needed to walk in hospital room?: Total Help needed climbing 3-5 steps with a railing? : Total 6 Click Score: 7    End of Session   Activity Tolerance: Patient tolerated  treatment well Patient left: in bed;with call bell/phone within reach;with bed alarm set;with SCD's reapplied Nurse Communication: Mobility status PT Visit Diagnosis: Hemiplegia and hemiparesis;Other abnormalities of gait and mobility (R26.89);Difficulty in walking, not elsewhere classified (R26.2);Other symptoms and signs involving the nervous system (R29.898);Muscle weakness (generalized) (M62.81) Hemiplegia - Right/Left: Left Hemiplegia - dominant/non-dominant: Non-dominant Hemiplegia - caused by: Cerebral infarction     Time: 6387-5643 PT Time Calculation (min) (ACUTE ONLY): 34 min  Charges:  $Therapeutic Activity: 8-22 mins                     Lillia Pauls, PT, DPT Acute Rehabilitation Services Pager [phone] Office [phone]    Norval Morton 05/13/2021, 5:01 PM

## 2021-05-14 DIAGNOSIS — I6601 Occlusion and stenosis of right middle cerebral artery: Secondary | ICD-10-CM | POA: Diagnosis not present

## 2021-05-14 DIAGNOSIS — I63511 Cerebral infarction due to unspecified occlusion or stenosis of right middle cerebral artery: Secondary | ICD-10-CM | POA: Diagnosis not present

## 2021-05-14 LAB — GLUCOSE, CAPILLARY
Glucose-Capillary: 93 mg/dL (ref 70–99)
Glucose-Capillary: 86 mg/dL (ref 70–99)
Glucose-Capillary: 89 mg/dL (ref 70–99)
Glucose-Capillary: 77 mg/dL (ref 70–99)
Glucose-Capillary: 100 mg/dL — ABNORMAL HIGH (ref 70–99)
Glucose-Capillary: 94 mg/dL (ref 70–99)

## 2021-05-14 LAB — PROTIME-INR
INR: 3.4 — ABNORMAL HIGH (ref 0.8–1.2)
Prothrombin Time: 34.4 seconds — ABNORMAL HIGH (ref 11.4–15.2)

## 2021-05-14 NOTE — Progress Notes (Addendum)
STROKE TEAM PROGRESS NOTE   INTERVAL HISTORY No acute events.  Afebrile.  VSS. Today Krista Maddox is sitting up in bed holding a stuffed hippo in her lap. When I ask the hippo's name she smiles and hands him to me. She looks out the window and states "Looks pretty day". She speaks a few more rapid phrases that are incomprehensible.  Awaiting inpatient rehab placement vs. SNF.   Vitals:   05/14/21 0303 05/14/21 0400 05/14/21 0410 05/14/21 0739  BP: 120/68   117/77  Pulse: 85   88  Resp: 18 19  17   Temp: 99.2 F (37.3 C)   98.8 F (37.1 C)  TempSrc: Oral   Oral  SpO2: 97%   100%  Weight:   111.2 kg   Height:       CBC:  Recent Labs  Lab 05/11/21 0440  WBC 8.4  HGB 11.6*  HCT 37.6  MCV 91.5  PLT 252   Basic Metabolic Panel:  Recent Labs  Lab 05/11/21 0440  NA 137  K 3.3*  CL 107  CO2 24  GLUCOSE 102*  BUN 16  CREATININE 0.62  CALCIUM 8.8*   IMAGING past 24 hours No results found.  PHYSICAL EXAM  Temp:  [98.5 F (36.9 C)-99.6 F (37.6 C)] 98.8 F (37.1 C) (06/02 0739) Pulse Rate:  [83-88] 88 (06/02 0739) Resp:  [17-24] 17 (06/02 0739) BP: (105-120)/(67-77) 117/77 (06/02 0739) SpO2:  [97 %-100 %] 100 % (06/02 0739) Weight:  [111.2 kg] 111.2 kg (06/02 0410)  General - morbid obesity, well developed, in no apparent distress.  Resp: No extra work of breathing.  Cardiovascular - Regular rhythm and rate. Abd: soft, round, NTTP, Non-distended  Ext: warm and well perfused  Neuro - Eyes open, awake alert, does not answer any orientation questions today,mimics with right hand, waves, points with her index finger, and shows 2 fingers.   Right gaze preference, but able to cross midline and left gaze incomplete.  Blinking to visual threat bilaterally. Left facial droop. Left upper extremity 0/5 but increased muscle tone, left LE slight withdraw to pain but also with increased muscle tone.Right upper extremity spontaneous movement against gravity. Right lower  extremity at least 3/5 spontaneously. Sensation, coordination and gait not able to be tested.  ASSESSMENT/PLAN [name] is a 35 y.o. female with history of multiple strokes who presented as a code stroke with symptoms suggestive of LVO. tPA was not given due to Eliquis. CTA head and neck revealed an occlusion of distal right M2 MCA with partial reconstitution and subsequent reocclusion of most M2 branches. Patient underwent thrombectomy.    Recurrent cryptogenic embolic strokes - Acute infarct of the R MCA due to right M1 occlusion s/p IR with TICI2c, cryptogenic etiology   Code Stroke: CT head No acute intracranial hemorrhage. Acute infarcts in the right MCA territory. ASPECTS 8.    CTA head & neck Occlusion of distal right M1 MCA with partial reconstitution and subsequent reocclusion of most M2 branches. Poor collaterals.  CT Perfusion imaging demonstrates core infarction 99 mL and penumbra of 34 mL. The extent of core infarction is calculated at greater than apparent loss of gray-white differentiation on noncontrast head CT. Therefore, penumbra may be greater.  2D Echo LVEF 60-65%, no structural abnormalities noted that could increase risk for CVA  Repeat CT Head 4/24 - large left MCA infarct, no midline shift. Small SAH sylvian fissure, diminishing in size  Hypercoagulable panel: Homocysteine 15.1 due to smoking,  otherwise negative to date  LDL 48  HgbA1c 5.7  VTE prophylaxis - SCDs + Lovenox  aspirin 325 mg daily and Eliquis (apixaban) daily prior to admission, now on ASA 325 and coumadin (pharmacy managed) with INR goal 2.5-3.5.  INR 3.0->2.8->3.4  Therapy recommendations:  SNF  Disposition:  SNF pending  Hx of cryptogenic strokes  In 08/2017 patient admitted for left sided numbness and headache.  MRI showed right cerebellum, right temporoparietal small infarcts.  EF 55 to 60%, CT head and neck unremarkable.  LDL 89, A1c 4.8.  TCD bubble study showed trivial PFO.   DVT negative.  TEE no PFO.  Hypercoagulable and autoimmune work-up negative.  Discharged with aspirin and Lipitor.  01/2018 episode of left facial numbness and left leg weakness.  CT head and neck unremarkable.  MRI negative.  06/[facility] to [facility] for right hand/face numbness.  MRI showed right tiny parietal infarct.  10/2020 admitted to [facility] for aphasia status post tPA.  CTA neck showed left M2 occlusion.  MRI showed left MCA and punctate right frontal infarct.  TCD bubble study potential trivial PFO, TEE tiny PFO.  11/2020 she was referred to loop recorder which was not done as physician thought she should be candidate for anticoagulation.  Started on Eliquis 5 mg twice daily.  Skin rash with blisters, resolved  Left flank skin lesion with blisters, concerning for shingles  Varicella zoster PCR negative, spoke with IC RN and precautions discontinued 5/6.   Off contact and airborne isolation; resolved  Valtrex discontinued   Hyperlipidemia  Home meds:  atrovastatin 80mg  and zetia 10mg    Will restart post CT if stable  LDL 48, goal < 70  Resumed lipitor 80 and zetia 10  Continue statin at discharge   Dysphagia   SLP and RD following   Now on Regular thin liquid  Low po intake, still on TF  S/p PEG    May consider trial of nocturnal feedings   Birth control  Recommend OP PCP or OBGYN discontinue Depo - provera due to numerous Contraindications including hx of tobacco use, migraines, and multiple strokes.    Tobacco abuse  Current smoker  Smoking cessation counseling will be provided  Contraindicated for birth control Depo   Hx of pseudotumor cerebri Chronic migraine  Follows with Dr. 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   Depression  Depressed mood after father and daughter left  Has increase Effexor to 150mg  bid  Other Stroke Risk Factors  Morbid  Obesity, Body mass index is 42.12 kg/m., BMI >/= 30 associated with increased stroke risk, recommend weight loss, diet and exercise as appropriate   Obstructive sleep apnea   Tachycardia - resolved now, on atenolol 25mg  daily (pt has home atenolol 50mg  daily)  Hospital day # 42   Krista A Bailey-Modzik, NP-C  ATTENDING NOTE: I reviewed above note and agree with the assessment and plan. Pt was seen and examined.   Pt lying in bed, awake alert, eyes open, able to have eye contact however, did not talk this am. Not following commands, still has left hemiplegia. Per PT/OT, pt sitting at the edge of bed or chair without problem, however, difficulty with standing due to fear. They are working on that now. CIR does not think she is candidate for CIR at this time. INR today 3.4. tachycardia resolved and now on atenolol 25mg  daily.   For detailed assessment  and plan, please refer to above as I have made changes wherever appropriate.   Marvel Plan, MD PhD Stroke Neurology 05/14/2021 1:25 PM      To contact Stroke Continuity provider, please refer to [email or web address]. After hours, contact General Neurology

## 2021-05-14 NOTE — TOC Progression Note (Addendum)
Transition of Care [facility]) - Progression Note    Patient Details  Name: DEJIA EBRON MRN: [id number] Date of Birth: [birth year]  Transition [facility] Care [facility]) CM/SW Contact  Eduard Roux, [place] Phone Number: 05/14/2021, 11:10 AM  Clinical Narrative:     Patient has no bed offers CSW called patient' father-left voice message.  Antony Blackbird, MSW, LCSW Clinical Social Worker   Expected Discharge Plan: Skilled Nursing Facility Barriers to Discharge: Continued Medical Work up,Insurance Authorization  Expected Discharge Plan and Services Expected Discharge Plan: Skilled Nursing Facility       Living arrangements for the past 2 months: Single Family Home                                       Social Determinants of Health (SDOH) Interventions    Readmission Risk Interventions No flowsheet data found.

## 2021-05-14 NOTE — TOC Progression Note (Addendum)
Transition of Care [facility]) - Progression Note    Patient Details  Name: ROSALEA WITHROW MRN: [id number] Date of Birth: 05/15/1986  Transition of Care [facility]) CM/SW Contact  Eduard Roux, [place] Phone Number: 05/14/2021, 4:25 PM  Clinical Narrative:     CSW called patient's father- no response  [facility] declined  Patient has no bed offers Antony Blackbird, MSW, LCSW Clinical Social Worker   Expected Discharge Plan: Skilled Nursing Facility Barriers to Discharge: Continued Medical Work up,Insurance Authorization  Expected Discharge Plan and Services Expected Discharge Plan: Skilled Nursing Facility       Living arrangements for the past 2 months: Single Family Home                                       Social Determinants of Health (SDOH) Interventions    Readmission Risk Interventions No flowsheet data found.

## 2021-05-14 NOTE — Progress Notes (Signed)
ANTICOAGULATION CONSULT NOTE  Pharmacy Consult:  Coumadin Indication:  Acute stroke  Allergies  Allergen Reactions  . Tape Rash    Cannot tolerate paper tape    Patient Measurements: Height: 5\' 5"  (165.1 cm) Weight: 111.2 kg (245 lb 2.4 oz) IBW/kg (Calculated) : 57   Vital Signs: Temp: 98.8 F (37.1 C) (06/02 0739) Temp Source: Oral (06/02 0739) BP: 117/77 (06/02 0739) Pulse Rate: 88 (06/02 0739)  Labs: Recent Labs    05/14/21 0442  LABPROT 34.4*  INR 3.4*    Estimated Creatinine Clearance: 123.1 mL/min (by C-G formula based on SCr of 0.62 mg/dL).  Assessment: 35 y.o female on apixaban PTA for hx prior strokes, ?DVT.  Last dose PTA " unknown," admitted 04/02/21 with acute CVA.  No TPA due to apixaban pta.  Underwent thrombectomy on 5/21.  Pharmacy consulted for Coumadin dosing.  INR therapeutic and stable on 6/2. Received 6/1 10 mg dose as scheduled. Next check due on 6/6. No bleeding noted.   Goal of Therapy:  INR 2.5-3.5 per Neurology Monitor platelets by anticoagulation protocol: Yes   Plan:  Continue Coumadin 7.5mg  daily except 10mg  on MWF INR on Mon and Thurs only  Thank you for allowing pharmacy to be a part of this patient's care.  , PharmD, BCPS Emergency Medicine Clinical Pharmacist 05/14/2021 10:30 AM

## 2021-05-14 NOTE — Progress Notes (Signed)
Occupational Therapy Treatment Patient Details Name: Krista Maddox MRN: [id number] DOB: [birth year] Today's Date: 05/14/2021    History of present illness The pt is a 35 yo female presenting with L sided facial droop and L sided weakness on 4/21. Underwent angiogram and revascularization on 4/21. CT head showing large right MCA territory infarct with 4 mm leftward midline shift. S/p PEG 5/5. 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   OT comments  Pt continues to have incremental but steady progress during OT session. Session conducted in conjunction with PT, with session focus on functional sit<>stands. When attempting to stand pt pushing back against bed with RUE, therefore utilized sheet around pts trunk to provide a sling around pts BUEs to prohibit pt from reaching back with RUE and to facilitate an anterior weight shift to come into standing. Attempted sit<>stand x4 with pt resistant to standing stating " I'm not ready, I'm not ready." Additionally utilized mirror to provide visual feedback to promote improved awareness with proprioception however unclear if pt attending to visual feedback. Also, utilized occlusion glasses's with R visual field blocked in an effort to shift pts attention to L. Will continue efforts to advance pt to a place where she is an acceptable rehab candidate.   Follow Up Recommendations  CIR    Equipment Recommendations  3 in 1 bedside commode    Recommendations for Other Services      Precautions / Restrictions Precautions Precautions: Fall Precaution Comments: abdominal binder; peg Required Braces or Orthoses: Other Brace Other Brace: L PRAFO; Lt resting hand splint Restrictions Weight Bearing Restrictions: No       Mobility Bed Mobility Overal bed mobility: Needs Assistance Bed Mobility: Sit to Supine;Rolling;Sidelying to Sit Rolling: Max assist Sidelying to sit: Max assist;+2 for  safety/equipment     Sit to sidelying: Max assist;+2 for safety/equipment General bed mobility comments: decreased initiation noted when exiting bed to pts L side, unable to maneuver RLE to EOB despite max multimodal cues, pt ultimately requried MAXA  +2 ( +2 more so for safety) to roll to pts L side and elevate trunk into sitting, when returning to sidelying lifted pts RLE back into bed with the intention of facilitating automatic initiation with LLE to elevate LLE back to bed, however no initiation noted with pt needing MAX A to elevate LLE back to bed    Transfers Overall transfer level: Needs assistance Equipment used: 2 person hand held assist (face to face assist with third person assisting with controlling RUE ( sheet placed around pts trunk to prohibit pt from reaching back to bed with pts RUE)) Transfers: Sit to/from Stand Sit to Stand: Total assist;+2 physical assistance         General transfer comment: removed rail from end of bed with x4 attempts to stand with sheet wrapped around pts trunk and BUEs to deter pt from trying to reach back to bed during standing.  Each trial pt unable to come into stand stating " i'm not ready, I'm not reacdy" and resistant to assistance. Blocked LLE and used momentum to come into stand with little success noted. were able to clear pts buttock more on last 2 attempts. difficulty with getting bed pads positioned underneath pts buttock enough to facilitate elevating pts buttocks and shifting pelvis anteriorly    Balance Overall balance assessment: Needs assistance Sitting-balance support: Feet supported;Single extremity supported;No upper extremity supported Sitting balance-Leahy Scale: Fair Sitting balance - Comments: sitting  EOB during session with min guard, utlized mirror from EOB with attention to mirror noted     Standing balance-Leahy Scale: Zero Standing balance comment: unable to come into full stand                            ADL either performed or assessed with clinical judgement   ADL Overall ADL's : Needs assistance/impaired                         Toilet Transfer: Total assistance;+2 for physical assistance Toilet Transfer Details (indicate cue type and reason): unable to stand x4 attempts with total A +2         Functional mobility during ADLs: Maximal assistance;+2 for physical assistance;+2 for safety/equipment;Total assistance (bed mobility and attempted sit<>stand) General ADL Comments: session focus on attempting sit<>stands x4, utilized occlusion glasses ( with translucent tape on R side) and utilized sheet around pts UEs to deter pt from reaching back with RUE, good success with sheet but pt still hesitant to stand and WB thru BLEs, also used mirror to provide visual feedback     Vision   Additional Comments: Pt spontaneously looking to Lt today, used occlusion glasses with tape on R side this session, unclear if pt occlusion glasses helping, pt more interested in taking them on and off, did leave them in room to attempt next session   Perception     Praxis Praxis Praxis-Other Comments: deficits noted in all areas although difficult to further assess secondary to cognition    Cognition Arousal/Alertness: Awake/alert Behavior During Therapy: Restless;Anxious Overall Cognitive Status: Impaired/Different from baseline Area of Impairment: Attention;Following commands;Awareness;Problem solving;Safety/judgement                   Current Attention Level: Focused   Following Commands: Follows one step commands inconsistently;Follows one step commands with increased time Safety/Judgement: Decreased awareness of safety;Decreased awareness of deficits Awareness: Emergent Problem Solving: Slow processing;Decreased initiation;Difficulty sequencing;Requires verbal cues;Requires tactile cues General Comments: pt continuing to attempt to verbalize with pt stating " not ready, not  ready" when attempting to stand. Pt continues to have difficulty following commands and presents with poor attention to task, pt will attend to therapists for a brief amount of time but then becomes distracted by extraneous stimuli        Exercises     Shoulder Instructions       General Comments      Pertinent Vitals/ Pain       Pain Assessment: Faces Faces Pain Scale: No hurt  Home Living                                          Prior Functioning/Environment              Frequency  Min 3X/week        Progress Toward Goals  OT Goals(current goals can now be found in the care plan section)  Progress towards OT goals: Progressing toward goals  Acute Rehab OT Goals Patient Stated Goal: none stated OT Goal Formulation: With patient Time For Goal Achievement: 05/25/21 Potential to Achieve Goals: Good  Plan Discharge plan remains appropriate;Frequency remains appropriate    Co-evaluation      Reason for Co-Treatment: Complexity of the patient's impairments (multi-system involvement);For patient/therapist safety;To address  functional/ADL transfers;Necessary to address cognition/behavior during functional activity   OT goals addressed during session: ADL's and self-care      AM-PAC OT "6 Clicks" Daily Activity     Outcome Measure   Help from another person eating meals?: A Little Help from another person taking care of personal grooming?: A Little Help from another person toileting, which includes using toliet, bedpan, or urinal?: Total Help from another person bathing (including washing, rinsing, drying)?: Total Help from another person to put on and taking off regular upper body clothing?: A Lot Help from another person to put on and taking off regular lower body clothing?: Total 6 Click Score: 11    End of Session Equipment Utilized During Treatment: Gait belt  OT Visit Diagnosis: Unsteadiness on feet (R26.81) Symptoms and signs  involving cognitive functions: Cerebral infarction Hemiplegia - Right/Left: Left Hemiplegia - dominant/non-dominant: Non-Dominant Hemiplegia - caused by: Cerebral infarction   Activity Tolerance Patient tolerated treatment well   Patient Left in bed;with call bell/phone within reach;Other (comment) (SLP entering to work with pt)   Nurse Communication Mobility status        Time: [phone] OT Time Calculation (min): 31 min  Charges: OT General Charges $OT Visit: 1 Visit OT Treatments $Therapeutic Activity: 8-22 mins  Lenor Derrick., COTA/L Acute Rehabilitation Services [phone]    Barron Schmid 05/14/2021, 9:38 AM

## 2021-05-14 NOTE — Progress Notes (Signed)
Physical Therapy Treatment Patient Details Name: Krista Maddox MRN: [id number] DOB: [birth year] Today's Date: 05/14/2021    History of Present Illness The pt is a 35 yo female presenting with L sided facial droop and L sided weakness on 4/21. Underwent angiogram and revascularization on 4/21. CT head showing large right MCA territory infarct with 4 mm leftward midline shift. S/p PEG 5/5. 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    PT Comments    The pt was agreeable to session with focus on increasing pt comfort with OOB transfers and LE strength. The pt was able to complete bed mobility with maxA of 1, and maintain static sitting without physical assist, but demos fear of falling when attempting to transition to standing at EOB despite assist of 3. The pt was able to demo improved attention to L side, but remains limited in attention span without repeated cues/movement to maintain attention. The pt will continue to benefit from max therapies acutely and following d/c to maximize functional recovery. Continue to recommend CIR level therapies at d/c.     Follow Up Recommendations  Supervision/Assistance - 24 hour (Inpatient Rehab with [facility] specialty team)     Equipment Recommendations  Wheelchair (measurements PT);Wheelchair cushion (measurements PT);Hospital bed    Recommendations for Other Services       Precautions / Restrictions Precautions Precautions: Fall Precaution Comments: abdominal binder; peg Required Braces or Orthoses: Other Brace Other Brace: L PRAFO; Lt resting hand splint Restrictions Weight Bearing Restrictions: No    Mobility  Bed Mobility Overal bed mobility: Needs Assistance Bed Mobility: Sit to Supine;Rolling;Sidelying to Sit Rolling: Max assist Sidelying to sit: Max assist;+2 for safety/equipment   Sit to supine: +2 for physical assistance;Mod assist Sit to sidelying: Max assist;+2 for  safety/equipment General bed mobility comments: decreased initiation noted when exiting bed to pts L side, unable to maneuver RLE to EOB despite max multimodal cues, pt ultimately requried MAXA  +2 ( +2 more so for safety) to roll to pts L side and elevate trunk into sitting, when returning to sidelying lifted pts RLE back into bed with the intention of facilitating automatic initiation with LLE to elevate LLE back to bed, however no initiation noted with pt needing MAX A to elevate LLE back to bed    Transfers Overall transfer level: Needs assistance Equipment used: 2 person hand held assist (face to face assist with third person assisting with controlling RUE ( sheet placed around pts trunk to prohibit pt from reaching back to bed with pts RUE)) Transfers: Sit to/from Stand Sit to Stand: Total assist;+2 physical assistance         General transfer comment: removed rail from end of bed with x4 attempts to stand with sheet wrapped around pts trunk and BUEs to deter pt from trying to reach back to bed during standing.  Each trial pt unable to come into stand stating " i'm not ready, I'm not reacdy" and resistant to assistance. Blocked LLE and used momentum to come into stand with little success noted. were able to clear pts buttock more on last 2 attempts. difficulty with getting bed pads positioned underneath pts buttock enough to facilitate elevating pts buttocks and shifting pelvis anteriorly  Ambulation/Gait         Gait velocity: pt unable to generate steps this session or complete a full stand          Modified Rankin (Stroke Patients Only) Modified Rankin (  Stroke Patients Only) Pre-Morbid Rankin Score: Moderate disability Modified Rankin: Severe disability     Balance Overall balance assessment: Needs assistance Sitting-balance support: Feet supported;Single extremity supported;No upper extremity supported Sitting balance-Leahy Scale: Fair Sitting balance - Comments:  sitting EOB during session with min guard, utlized mirror from EOB with attention to mirror noted     Standing balance-Leahy Scale: Zero Standing balance comment: unable to come into full stand                            Cognition   Behavior During Therapy: Restless;Anxious Overall Cognitive Status: Impaired/Different from baseline Area of Impairment: Attention;Following commands;Awareness;Problem solving;Safety/judgement                   Current Attention Level: Focused   Following Commands: Follows one step commands inconsistently;Follows one step commands with increased time Safety/Judgement: Decreased awareness of safety;Decreased awareness of deficits Awareness: Emergent Problem Solving: Slow processing;Decreased initiation;Difficulty sequencing;Requires verbal cues;Requires tactile cues General Comments: pt continuing to attempt to verbalize with pt stating " not ready, not ready" when attempting to stand. Pt continues to have difficulty following commands and presents with poor attention to task, pt will attend to therapists for a brief amount of time but then becomes distracted by extraneous stimuli      Exercises Other Exercises Other Exercises: initiation of head lift and trunk rotation to L from supine    General Comments General comments (skin integrity, edema, etc.): VSS on RA      Pertinent Vitals/Pain Pain Assessment: Faces Faces Pain Scale: No hurt Pain Location: generalized with mobility Pain Descriptors / Indicators: Discomfort;Grimacing Pain Intervention(s): Monitored during session           PT Goals (current goals can now be found in the care plan section) Acute Rehab PT Goals Patient Stated Goal: none stated PT Goal Formulation: Patient unable to participate in goal setting Time For Goal Achievement: 05/25/21 Potential to Achieve Goals: Fair Progress towards PT goals: Progressing toward goals    Frequency    Min  4X/week      PT Plan Current plan remains appropriate    Co-evaluation PT/OT/SLP Co-Evaluation/Treatment: Yes Reason for Co-Treatment: Complexity of the patient's impairments (multi-system involvement);For patient/therapist safety;To address functional/ADL transfers;Necessary to address cognition/behavior during functional activity PT goals addressed during session: Mobility/safety with mobility;Balance;Strengthening/ROM        AM-PAC PT "6 Clicks" Mobility   Outcome Measure  Help needed turning from your back to your side while in a flat bed without using bedrails?: A Lot Help needed moving from lying on your back to sitting on the side of a flat bed without using bedrails?: Total Help needed moving to and from a bed to a chair (including a wheelchair)?: Total Help needed standing up from a chair using your arms (e.g., wheelchair or bedside chair)?: Total Help needed to walk in hospital room?: Total Help needed climbing 3-5 steps with a railing? : Total 6 Click Score: 7    End of Session Equipment Utilized During Treatment: Gait belt Activity Tolerance: Patient tolerated treatment well Patient left: in bed;with call bell/phone within reach;with bed alarm set;with SCD's reapplied Nurse Communication: Mobility status PT Visit Diagnosis: Hemiplegia and hemiparesis;Other abnormalities of gait and mobility (R26.89);Difficulty in walking, not elsewhere classified (R26.2);Other symptoms and signs involving the nervous system (R29.898);Muscle weakness (generalized) (M62.81) Hemiplegia - Right/Left: Left Hemiplegia - dominant/non-dominant: Non-dominant Hemiplegia - caused by: Cerebral infarction  Time: 7902-4097 PT Time Calculation (min) (ACUTE ONLY): 30 min  Charges:  $Therapeutic Activity: 8-22 mins                     Rolm Baptise, PT, DPT   Acute Rehabilitation Department Pager #: [phone]   [name] 05/14/2021, 6:44 PM

## 2021-05-14 NOTE — Progress Notes (Signed)
  Speech Language Pathology Treatment: Cognitive-Linquistic  Patient Details Name: Krista Maddox MRN: [id number] DOB: 1986-05-17 Today's Date: 05/14/2021 Time: 7169-6789 SLP Time Calculation (min) (ACUTE ONLY): 23 min  Assessment / Plan / Recommendation Clinical Impression  Krista Maddox was more engaged this session. Therapist arrived at end of PT/OT where she attempted standing. She verbalized spontaneously 5-6 occasions in response to therapist removing old bandage from right arm. Phrases were  mixed with neologisms and paraphasias and her intended message was not accurately relayed. Attempts to elicit were unsuccessful. Of significance, Krista Maddox sustained her attention during activity for 3 minutes needing moderate cues. Therapist tossed her stuffed hippo which she caught and with repetition and visual cues, was able to propel it to therapist x 2. She waved in response to therapist's wave. Therapy to focus on her expression in order to make needs known.     HPI HPI: Pt is a 35 y.o. female with a PMHx of prior strokes x 4 (right cerebellar and right posterior frontal infarctions in 2018 (while smoking and on oral contraceptives), as well as an acute occlusion of her left M2 on 11/09/2020 for which she was treated with thrombectomy at [facility] with sequelae of aphasia and left sided numbness), chronic MHAs, chronic AC with Eliquis, HLD, ? IIH, DDD, ITP during pregnancy, remote tobacco use, and disorder of optic nerve. Patient presented as a code stroke due to right gaze preference, left sided weakness, aphasia and left facial droop. FY:BOFBP infarcts in the right MCA  territory; ZWC:HENIDPOEU of distal right M1 MCA with partial reconstitution and  subsequent reocclusion of most M2 branches. Poor collaterals. Pt s/p revascularization of occluded RT MCA. Cotrak placed 4/22      SLP Plan  Continue with current plan of care       Recommendations                   Oral Care Recommendations: Oral  care BID Follow up Recommendations: Skilled Nursing facility SLP Visit Diagnosis: Cognitive communication deficit (R41.841);Aphasia (R47.01) Plan: Continue with current plan of care                      [name] 05/14/2021, 1:10 PM  [name] [name].Ed Nurse, children's [phone] Office [phone]

## 2021-05-15 LAB — GLUCOSE, CAPILLARY
Glucose-Capillary: 134 mg/dL — ABNORMAL HIGH (ref 70–99)
Glucose-Capillary: 95 mg/dL (ref 70–99)
Glucose-Capillary: 97 mg/dL (ref 70–99)
Glucose-Capillary: 73 mg/dL (ref 70–99)

## 2021-05-15 MED ORDER — ACETAMINOPHEN 325 MG PO TABS: 650.0000 mg | ORAL_TABLET | ORAL | Status: DC | PRN

## 2021-05-15 MED ORDER — ATORVASTATIN CALCIUM 80 MG PO TABS: 80.0000 mg | ORAL_TABLET | Freq: Every day | ORAL | Status: DC

## 2021-05-15 MED ORDER — POLYETHYLENE GLYCOL 3350 17 G PO PACK: 17.0000 g | PACK | Freq: Every day | ORAL | 0 refills | Status: DC

## 2021-05-15 MED ORDER — PROSOURCE TF PO LIQD: 90.0000 mL | Freq: Two times a day (BID) | ORAL | Status: DC

## 2021-05-15 MED ORDER — EZETIMIBE 10 MG PO TABS: 10.0000 mg | ORAL_TABLET | Freq: Every day | ORAL | Status: AC

## 2021-05-15 MED ORDER — PANTOPRAZOLE SODIUM 40 MG PO PACK: 40.0000 mg | PACK | Freq: Every day | ORAL | Status: DC

## 2021-05-15 MED ORDER — TOPIRAMATE 50 MG PO TABS: 150.0000 mg | ORAL_TABLET | Freq: Two times a day (BID) | ORAL | Status: DC

## 2021-05-15 MED ORDER — VENLAFAXINE HCL 75 MG PO TABS: 150.0000 mg | ORAL_TABLET | Freq: Two times a day (BID) | ORAL | Status: DC

## 2021-05-15 MED ORDER — WARFARIN SODIUM 10 MG PO TABS: 10.0000 mg | ORAL_TABLET | ORAL | Status: DC

## 2021-05-15 MED ORDER — ACETAMINOPHEN 650 MG RE SUPP: 650.0000 mg | RECTAL | 0 refills | Status: DC | PRN

## 2021-05-15 MED ORDER — ATENOLOL 25 MG PO TABS: 25.0000 mg | ORAL_TABLET | Freq: Every day | ORAL | Status: DC

## 2021-05-15 MED ORDER — WARFARIN SODIUM 7.5 MG PO TABS: 7.5000 mg | ORAL_TABLET | ORAL | Status: DC

## 2021-05-15 MED ORDER — OSMOLITE 1.5 CAL PO LIQD: 1000.0000 mL | ORAL | 0 refills | Status: DC

## 2021-05-15 MED ORDER — ACETAMINOPHEN 160 MG/5ML PO SOLN: 650.0000 mg | ORAL | 0 refills | Status: DC | PRN

## 2021-05-15 MED ORDER — SENNOSIDES-DOCUSATE SODIUM 8.6-50 MG PO TABS: 1.0000 | ORAL_TABLET | Freq: Two times a day (BID) | ORAL | Status: DC

## 2021-05-15 MED ORDER — BISACODYL 10 MG RE SUPP: 10.0000 mg | Freq: Every day | RECTAL | 0 refills | Status: DC | PRN

## 2021-05-15 MED ORDER — FREE WATER: 150.0000 mL | Status: DC

## 2021-05-15 NOTE — Progress Notes (Signed)
STROKE TEAM PROGRESS NOTE   INTERVAL HISTORY No acute events.  Afebrile.  VSS. Krista Maddox is lying in bed fully alert attentive to TV in NAD. She smiles when I greet her and says "hey". She  turns the volume down on her remote control. She further responds to my questions with incomprehensible words. Does not follow commands reliably.  Awaiting SNF placement for about a month now, has no bed offers.   Vitals:   05/15/21 0000 05/15/21 0400 05/15/21 0413 05/15/21 0715  BP: 112/70 114/72  101/70  Pulse: 87 91  98  Resp: 18 18  17   Temp: 98.6 F (37 C) 98.7 F (37.1 C)  98.6 F (37 C)  TempSrc: Oral Oral  Oral  SpO2: 98% 97%  97%  Weight:   111.2 kg   Height:       CBC:  Recent Labs  Lab 05/11/21 0440  WBC 8.4  HGB 11.6*  HCT 37.6  MCV 91.5  PLT 252   Basic Metabolic Panel:  Recent Labs  Lab 05/11/21 0440  NA 137  K 3.3*  CL 107  CO2 24  GLUCOSE 102*  BUN 16  CREATININE 0.62  CALCIUM 8.8*   IMAGING past 24 hours No results found.  PHYSICAL EXAM  Temp:  [97.9 F (36.6 C)-99.1 F (37.3 C)] 98.6 F (37 C) (06/03 0715) Pulse Rate:  [87-100] 98 (06/03 0715) Resp:  [17-20] 17 (06/03 0715) BP: (100-114)/(64-72) 101/70 (06/03 0715) SpO2:  [97 %-99 %] 97 % (06/03 0715) Weight:  [111.2 kg] 111.2 kg (06/03 0413)  General - morbid obesity, well developed, in no apparent distress.  Resp: No extra work of breathing.  Cardiovascular - Regular rhythm and rate. Abd: soft, round, NTTP, Non-distended  Ext: warm and well perfused  Neuro - Eyes open, awake alert, does not answer any orientation questions today,mimics with right hand, waves, points with her index finger, and shows 2 fingers.   Right gaze preference, but able to cross midline and left gaze incomplete.  Blinking to visual threat bilaterally. Left facial droop. Left upper extremity 0/5 but increased muscle tone, left LE slight withdraw to pain but also with increased muscle tone.Right upper extremity  spontaneous movement against gravity. Right lower extremity at least 3/5 spontaneously. Sensation, coordination and gait not able to be tested.  ASSESSMENT/PLAN Ms. JOHNANNA Maddox is a 35 y.o. female with history of multiple strokes who presented as a code stroke with symptoms suggestive of LVO. tPA was not given due to Eliquis. CTA head and neck revealed an occlusion of distal right M2 MCA with partial reconstitution and subsequent reocclusion of most M2 branches. Patient underwent thrombectomy.    Recurrent cryptogenic embolic strokes - Acute infarct of the R MCA due to right M1 occlusion s/p IR with TICI2c, cryptogenic etiology   Code Stroke: CT head No acute intracranial hemorrhage. Acute infarcts in the right MCA territory. ASPECTS 8.    CTA head & neck Occlusion of distal right M1 MCA with partial reconstitution and subsequent reocclusion of most M2 branches. Poor collaterals.  CT Perfusion imaging demonstrates core infarction 99 mL and penumbra of 34 mL. The extent of core infarction is calculated at greater than apparent loss of gray-white differentiation on noncontrast head CT. Therefore, penumbra may be greater.  2D Echo LVEF 60-65%, no structural abnormalities noted that could increase risk for CVA  Repeat CT Head 4/24 - large left MCA infarct, no midline shift. Small SAH sylvian fissure, diminishing in size  Hypercoagulable panel: Homocysteine 15.1 due to smoking, otherwise negative to date  LDL 48  HgbA1c 5.7  VTE prophylaxis - SCDs + Lovenox  aspirin 325 mg daily and Eliquis (apixaban) daily prior to admission, now on ASA 325 and coumadin (pharmacy managed) with INR goal 2.5-3.5.  INR 3.0->2.8->3.4  Therapy recommendations:  SNF  Disposition:  SNF pending  Hx of cryptogenic strokes  In 08/2017 patient admitted for left sided numbness and headache.  MRI showed right cerebellum, right temporoparietal small infarcts.  EF 55 to 60%, CT head and neck unremarkable.  LDL  89, A1c 4.8.  TCD bubble study showed trivial PFO.  DVT negative.  TEE no PFO.  Hypercoagulable and autoimmune work-up negative.  Discharged with aspirin and Lipitor.  01/2018 episode of left facial numbness and left leg weakness.  CT head and neck unremarkable.  MRI negative.  06/2018 admitted to [facility] for right hand/face numbness.  MRI showed right tiny parietal infarct.  10/2020 admitted to [facility] for aphasia status post tPA.  CTA neck showed left M2 occlusion.  MRI showed left MCA and punctate right frontal infarct.  TCD bubble study potential trivial PFO, TEE tiny PFO.  11/2020 she was referred to loop recorder which was not done as physician thought she should be candidate for anticoagulation.  Started on Eliquis 5 mg twice daily.  Skin rash with blisters, resolved  Left flank skin lesion with blisters, concerning for shingles  Varicella zoster PCR negative, spoke with IC RN and precautions discontinued 5/6.   Off contact and airborne isolation; resolved  Valtrex discontinued   Hyperlipidemia  Home meds:  atrovastatin 80mg  and zetia 10mg    Will restart post CT if stable  LDL 48, goal < 70  Resumed lipitor 80 and zetia 10  Continue statin at discharge   Dysphagia   SLP and RD following   Now on Regular thin liquid  Low po intake, still on TF  S/p PEG    May consider trial of nocturnal feedings   Birth control  Recommend OP PCP or OBGYN discontinue Depo - provera due to numerous Contraindications including hx of tobacco use, migraines, and multiple strokes.    Tobacco abuse  Current smoker  Smoking cessation counseling will be provided  Contraindicated for birth control Depo   Hx of pseudotumor cerebri Chronic migraine  Follows with Dr. at 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   Depression  Depressed mood after father and daughter left  Has increase Effexor to  150mg  bid  Other Stroke Risk Factors  Morbid Obesity, Body mass index is 42.12 kg/m., BMI >/= 30 associated with increased stroke risk, recommend weight loss, diet and exercise as appropriate   Obstructive sleep apnea   Tachycardia - resolved now, on atenolol 25mg  daily (pt has home atenolol 50mg  daily)  Hospital day # 59   Baillie Mohammad A Bailey-Modzik, NP-C      To contact Stroke Continuity provider, please refer to . After hours, contact General Neurology

## 2021-05-15 NOTE — Progress Notes (Signed)
Physical Therapy Treatment Patient Details Name: Krista Maddox MRN: [id number] DOB: [birth year] Today's Date: 05/15/2021    History of Present Illness The pt is a 35 yo female presenting with L sided facial droop and L sided weakness on 4/21. Underwent angiogram and revascularization on 4/21. CT head showing large right MCA territory infarct with 4 mm leftward midline shift. S/p PEG 5/5. 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    PT Comments    The pt was seen by PT/OT/SLP in conjunction this morning to safely progress both sitting balance, ability to reach/wt shift, and to address cognitive challenge. The pt was agreeable to session, continues to require maxA to complete bed mobility and simple, direct cues to inspire command following at this time. The pt remains inconsistent with command following, required multimodal cues and environment with limited distractions as the pt is easily distracted which impacts her ability to follow commands. The pt continues to present with increased anxiety and fear of falling, despite physical barriers by staff and safe positioning seated EOB. The pt was uninterested in reaching/wt shift task at the EOB, but was able to work on coordination and command following with ball-throwing activity in the bed. Due to pt fear and prior inability to stand, I feel a tilt bed could be beneficial for initiating wt bearing on BLE and progression to standing tolerance. Continue to recommend CIR level therapies to maximize recovery of strength, stability, and transfers to reduce caregiver burden following d/c.    Follow Up Recommendations  Supervision/Assistance - 24 hour (Inpatient Rehab with CVA specialty team)     Equipment Recommendations  Wheelchair (measurements PT);Wheelchair cushion (measurements PT);Hospital bed    Recommendations for Other Services       Precautions / Restrictions Precautions Precautions:  Fall Precaution Comments: abdominal binder; peg Required Braces or Orthoses: Other Brace Other Brace: L PRAFO; Lt resting hand splint Restrictions Weight Bearing Restrictions: No    Mobility  Bed Mobility Overal bed mobility: Needs Assistance Bed Mobility: Sit to Supine;Rolling;Sidelying to Sit Rolling: Max assist Sidelying to sit: Max assist     Sit to sidelying: Max assist General bed mobility comments: pt rolling to L with MAX A +1, needing assist to maneuver BLEs to EOB, MAX A +1 to elevate trunk into sitting. pt noted to lay herself back down but needed assist to elevate BLEs back to bed, attempted use of gait belt around LLE with pt unable to attend to task enough to effectively use gait belt    Transfers                 General transfer comment: deferred to focus on seated activities and pt reporting desire to return to supine     Modified Rankin (Stroke Patients Only) Modified Rankin (Stroke Patients Only) Pre-Morbid Rankin Score: Moderate disability Modified Rankin: Severe disability     Balance Overall balance assessment: Needs assistance Sitting-balance support: Feet supported;Single extremity supported;No upper extremity supported Sitting balance-Leahy Scale: Fair Sitting balance - Comments: sitting EOB during session with min guard, utilized mirror from [employer] Arousal/Alertness: Awake/alert Behavior During Therapy: Restless;Anxious;Flat affect;Impulsive (labile, emotional at times) Overall Cognitive Status: Difficult to assess Area of Impairment: Attention;Following commands;Awareness;Problem solving;Safety/judgement  Current Attention Level: Focused   Following Commands: Follows one step commands inconsistently;Follows one step commands with increased time Safety/Judgement: Decreased awareness of safety;Decreased awareness of deficits Awareness: Emergent Problem  Solving: Slow processing;Decreased initiation;Difficulty sequencing;Requires verbal cues;Requires tactile cues General Comments: pt with limited attention to task easily distracted by any extraneous stimuli in her visual field, pt following commands inconsistently but starting to verbalize more consistently stating phrases such as " stand back" "ouch" "it hurts" and did state something about her hair but difficult to fully discern full statement      Exercises Other Exercises Other Exercises: pt encouraged to participate in seated reaching tasks on mirror placed in front of her. unable to maintain attention to ttask, slightly resistive to reaching, possibly due to fear of falling Other Exercises: siting in bed, throwing beach ball with therapists. Pt able to maintain eye contact and use RUE to gently toss ball in direction of therapist. minA from therapist to complete "catch" motion.    General Comments General comments (skin integrity, edema, etc.): VSS on RA, pt noted to have some redness on LUE, RN and NT notified as pt has allergy to tape in chart.      Pertinent Vitals/Pain Pain Assessment: Faces Faces Pain Scale: Hurts little more Pain Location: right leg hitting bed Pain Descriptors / Indicators: Grimacing Pain Intervention(s): Limited activity within patient's tolerance;Monitored during session;Repositioned           PT Goals (current goals can now be found in the care plan section) Acute Rehab PT Goals Patient Stated Goal: "stand back" PT Goal Formulation: Patient unable to participate in goal setting Time For Goal Achievement: 05/25/21 Potential to Achieve Goals: Fair Progress towards PT goals: Progressing toward goals    Frequency    Min 4X/week      PT Plan Current plan remains appropriate    Co-evaluation PT/OT/SLP Co-Evaluation/Treatment: Yes Reason for Co-Treatment: Complexity of the patient's impairments (multi-system involvement);Necessary to address  cognition/behavior during functional activity;To address functional/ADL transfers;For patient/therapist safety PT goals addressed during session: Mobility/safety with mobility;Balance;Strengthening/ROM        AM-PAC PT "6 Clicks" Mobility   Outcome Measure  Help needed turning from your back to your side while in a flat bed without using bedrails?: A Lot Help needed moving from lying on your back to sitting on the side of a flat bed without using bedrails?: Total Help needed moving to and from a bed to a chair (including a wheelchair)?: Total Help needed standing up from a chair using your arms (e.g., wheelchair or bedside chair)?: Total Help needed to walk in hospital room?: Total Help needed climbing 3-5 steps with a railing? : Total 6 Click Score: 7    End of Session   Activity Tolerance: Patient tolerated treatment well Patient left: in bed;with call bell/phone within reach;with bed alarm set;with SCD's reapplied Nurse Communication: Mobility status PT Visit Diagnosis: Hemiplegia and hemiparesis;Other abnormalities of gait and mobility (R26.89);Difficulty in walking, not elsewhere classified (R26.2);Other symptoms and signs involving the nervous system (R29.898);Muscle weakness (generalized) (M62.81) Hemiplegia - Right/Left: Left Hemiplegia - dominant/non-dominant: Non-dominant Hemiplegia - caused by: Cerebral infarction     Time: 8144-8185 PT Time Calculation (min) (ACUTE ONLY): 39 min  Charges:  $Neuromuscular Re-education: 8-22 mins                     Deland Pretty, DPT   Acute Rehabilitation Department Pager #: [phone]   [name] 05/15/2021, 3:34  PM   

## 2021-05-15 NOTE — Progress Notes (Signed)
Occupational Therapy Treatment Patient Details Name: Krista Maddox MRN: [id number] DOB: September 15, 1986 Today's Date: 05/15/2021    History of present illness The pt is a 35 yo female presenting with L sided facial droop and L sided weakness on 4/21. Underwent angiogram and revascularization on 4/21. CT head showing large right MCA territory infarct with 4 mm leftward midline shift. S/p PEG 5/5. 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   OT comments  Pt seen in conjunction with PT and SLP to facilitate improved attention to task and overall ability to follow commands. Pt required MAX A +1 for bed mobility and min guard assist to sit EOB. Attempted to have pt reach forward with use of mirror to draw pictures in shaving cream on mirror. Pt resistant to shift weight anteriorly and did not appear to attend to visual stimuli, resistant to feel of shaving cream. Pt was able to catch and release ball from upright position in bed with pt able to track target past midline to L. Continued to note improvements in speech with pt able to verbalize needing to lay back down d/t pain in RLE. Pt would continue to benefit from skilled occupational therapy while admitted and after d/c to address the below listed limitations in order to improve overall functional mobility and facilitate independence with BADL participation. DC plan remains appropriate, will follow acutely per POC.    Follow Up Recommendations  CIR    Equipment Recommendations  3 in 1 bedside commode    Recommendations for Other Services      Precautions / Restrictions Precautions Precautions: Fall Precaution Comments: abdominal binder; peg Required Braces or Orthoses: Other Brace Other Brace: L PRAFO; Lt resting hand splint Restrictions Weight Bearing Restrictions: No       Mobility Bed Mobility Overal bed mobility: Needs Assistance Bed Mobility: Sit to Supine;Rolling;Sidelying to  Sit Rolling: Max assist Sidelying to sit: Max assist     Sit to sidelying: Max assist General bed mobility comments: pt rolling to L with MAX A +1, needing assist to maneuver BLEs to EOB, MAX A +1 to elevate trunk into sitting. pt noted to lay herself back down but needed assist to elevate BLEs back to bed, attempted use of gait belt around LLE with pt unable to attend to task enough to effectively use gait belt    Transfers                 General transfer comment: deferred this session    Balance Overall balance assessment: Needs assistance Sitting-balance support: Feet supported;Single extremity supported;No upper extremity supported Sitting balance-Leahy Scale: Fair Sitting balance - Comments: sitting EOB during session with min guard, utilized mirror from EOB                                   ADL either performed or assessed with clinical judgement   ADL Overall ADL's : Needs assistance/impaired     Grooming: Brushing hair;Sitting;Total assistance Grooming Details (indicate cue type and reason): total A to brush even with max multimodal cues                   Toilet Transfer Details (indicate cue type and reason): defer this session         Functional mobility during ADLs: Maximal assistance (bed mobility) General ADL Comments: session focus on attention to task, sitting balance  and motor planning tasks with bilateral integration     Vision   Additional Comments: continues to prefer R gaze but looking to L spontaneously, also able to track stimulus across midline to throw ball to OTA   Perception     Praxis Praxis Praxis-Other Comments: deficits noted in all areas although difficult to further assess secondary to cognition, noted to attempt to brush her leg with her hair brush, 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    Cognition Arousal/Alertness: Awake/alert Behavior During Therapy: Restless;Anxious;Flat affect;Impulsive (emotional at  times) Overall Cognitive Status: Difficult to assess Area of Impairment: Attention;Following commands;Awareness;Problem solving;Safety/judgement                   Current Attention Level: Focused   Following Commands: Follows one step commands inconsistently;Follows one step commands with increased time Safety/Judgement: Decreased awareness of safety;Decreased awareness of deficits Awareness: Emergent   General Comments: pt with limited attention to task easily distracted by any extraneous stimuli in her visual field, pt following commands inconsistently but starting to verbalize more consistently stating phrases such as " stand back" "ouch" "it hurts" and did state something about her hair but difficult to fully discern full statement        Exercises     Shoulder Instructions       General Comments pt noted to have some redness on L arm, applied lotion and alerted RN    Pertinent Vitals/ Pain       Faces Pain Scale: Hurts little more Pain Location: RLE, hair when attempting to brush (pt stating "it hurts, it hurts" suspect pain from R leg pushing against bed rail when sitting EOB. pt stating "ouch" when attempting to brush pts hair) Pain Descriptors / Indicators: Discomfort;Grimacing Pain Intervention(s): Monitored during session;Limited activity within patient's tolerance;Repositioned  Home Living                                          Prior Functioning/Environment              Frequency  Min 3X/week        Progress Toward Goals  OT Goals(current goals can now be found in the care plan section)  Progress towards OT goals: Progressing toward goals  Acute Rehab OT Goals Patient Stated Goal: "stand back" OT Goal Formulation: With patient Time For Goal Achievement: 05/25/21 Potential to Achieve Goals: Good  Plan Discharge plan remains appropriate;Frequency remains appropriate    Co-evaluation    PT/OT/SLP Co-Evaluation/Treatment:  Yes Reason for Co-Treatment: Complexity of the patient's impairments (multi-system involvement);For patient/therapist safety;To address functional/ADL transfers;Necessary to address cognition/behavior during functional activity   OT goals addressed during session: ADL's and self-care;Other (comment) (attention to task)      AM-PAC OT "6 Clicks" Daily Activity     Outcome Measure   Help from another person eating meals?: A Little Help from another person taking care of personal grooming?: A Lot Help from another person toileting, which includes using toliet, bedpan, or urinal?: Total Help from another person bathing (including washing, rinsing, drying)?: Total Help from another person to put on and taking off regular upper body clothing?: A Lot Help from another person to put on and taking off regular lower body clothing?: Total 6 Click Score: 10    End of Session    OT Visit Diagnosis: Unsteadiness on feet (R26.81) Symptoms and signs involving cognitive functions: Cerebral infarction  Hemiplegia - Right/Left: Left Hemiplegia - dominant/non-dominant: Non-Dominant Hemiplegia - caused by: Cerebral infarction Pain - Right/Left: Right Pain - part of body: Leg (hair when brushing)   Activity Tolerance Patient tolerated treatment well   Patient Left in bed;with call bell/phone within reach;with bed alarm set;with SCD's reapplied   Nurse Communication Mobility status;Other (comment) (check redness on L arm)        Time: 9211-9417 OT Time Calculation (min): 41 min  Charges: OT General Charges $OT Visit: 1 Visit OT Treatments $Therapeutic Activity: 8-22 mins Lenor Derrick., COTA/L Acute Rehabilitation Services [phone]    Barron Schmid 05/15/2021, 10:58 AM

## 2021-05-15 NOTE — Progress Notes (Signed)
ANTICOAGULATION CONSULT NOTE  Pharmacy Consult:  Coumadin Indication:  Acute stroke  Allergies  Allergen Reactions  . Tape Rash    Cannot tolerate paper tape    Patient Measurements: Height: 5\' 5"  (165.1 cm) Weight: 111.2 kg (245 lb 2.4 oz) IBW/kg (Calculated) : 57   Vital Signs: Temp: 97.8 F (36.6 C) (06/03 1138) Temp Source: Oral (06/03 1138) BP: 121/75 (06/03 1138) Pulse Rate: 97 (06/03 1138)  Labs: Recent Labs    05/14/21 0442  LABPROT 34.4*  INR 3.4*    Estimated Creatinine Clearance: 123.1 mL/min (by C-G formula based on SCr of 0.62 mg/dL).  Assessment: 35 y.o female on apixaban PTA for hx prior strokes, ?DVT.  Last dose PTA " unknown," admitted 04/02/21 with acute CVA.  No TPA due to apixaban pta.  Underwent thrombectomy on 5/21.  Pharmacy consulted for Coumadin dosing.  INR therapeutic and stable on 6/2 at 3.4, next check due on 6/6. No bleeding noted.    Goal of Therapy:  INR 2.5-3.5 per Neurology Monitor platelets by anticoagulation protocol: Yes   Plan:  Continue Coumadin 7.5mg  daily except 10mg  on MWF INR on Mon and Thurs only  Thank you for allowing pharmacy to be a part of this patient's care.  , PharmD, BCPS Clinical Pharmacist Clinical phone for 05/15/2021: [phone] 05/15/2021 3:15 PM   **Pharmacist phone directory can now be found on [email or web address] ([email or web address]).  Listed under [facility].

## 2021-05-15 NOTE — Progress Notes (Signed)
  Speech Language Pathology Treatment: Cognitive-Linquistic  Patient Details Name: Krista Maddox MRN: [id number] DOB: 05/01/1986 Today's Date: 05/15/2021 Time: 9528-4132 SLP Time Calculation (min) (ACUTE ONLY): 47 min  Assessment / Plan / Recommendation Clinical Impression  Pt is more responsive when she is engaged, moving, acting upon her environment therefore, collaboration with PT/OT facilitates this. Sitting edge of bed pt encouraged to initiate movement via various activities. Provided hand over hand assist however she repeatedly withdrew and became distracted. Sustained attention for intervals of 1 min while tossing beach ball to therapists. Amount of motor assist needed decreased toward end of session with increased opportunities. Ciclaly's spontaneous verbalizations increase during times of pain, discomfort and she was able to convey her message (in fragmented phrases) on approximately 4-5 occasions with accuracy of words 70%. She follows commands sporadically approximately less than half the time. She is making progress towards goals.   HPI HPI: Pt is a 35 y.o. female with a PMHx of prior strokes x 4 (right cerebellar and right posterior frontal infarctions in 2018 (while smoking and on oral contraceptives), as well as an acute occlusion of her left M2 on 11/09/2020 for which she was treated with thrombectomy at [facility] with sequelae of aphasia and left sided numbness), chronic MHAs, chronic AC with Eliquis, HLD, ? IIH, DDD, ITP during pregnancy, remote tobacco use, and disorder of optic nerve. Patient presented as a code stroke due to right gaze preference, left sided weakness, aphasia and left facial droop. GM:WNUUV infarcts in the right MCA  territory; OZD:[id number] of distal right M1 MCA with partial reconstitution and  subsequent reocclusion of most M2 branches. Poor collaterals. Pt s/p revascularization of occluded RT MCA. Cotrak placed 4/22      SLP Plan  Continue with current plan  of care       Recommendations                   Oral Care Recommendations: Oral care BID Follow up Recommendations: Skilled Nursing facility SLP Visit Diagnosis: Cognitive communication deficit (R41.841);Aphasia (R47.01) Plan: Continue with current plan of care       GO                [name] 05/15/2021, 1:01 PM

## 2021-05-15 NOTE — Care Management (Addendum)
[facility] CALLED. Asked them to bump up if possible as she needs to get to IP rehab timely. They will try to get her ASAP 1752 RN tried to call report, was denied for today. Called Tammy in admissions she will send message over to the team and re-call report shortly [facility] on the way.soon. Patient should get there before 8pm

## 2021-05-15 NOTE — TOC Progression Note (Signed)
Transition of Care [facility]) - Progression Note    Patient Details  Name: Krista Maddox MRN: [id number] Date of Birth: Feb 05, 1986  Transition of Care [facility]) CM/SW Contact  Eduard Roux, [place] Phone Number: 05/15/2021, 11:29 AM  Clinical Narrative:     CSW spoke with patient's, father- discussed disposition plan and encouraged the family to explore options for patient if admitted into rehab. CSW informed He states family is having some discussion and he will update the CSW.   TOC will continue to follow and assist with discharge planning.   Antony Blackbird, MSW, LCSW Clinical Social Worker   Expected Discharge Plan: Skilled Nursing Facility Barriers to Discharge: Continued Medical Work up,Insurance Authorization  Expected Discharge Plan and Services Expected Discharge Plan: Skilled Nursing Facility       Living arrangements for the past 2 months: Single Family Home                                       Social Determinants of Health (SDOH) Interventions    Readmission Risk Interventions No flowsheet data found.

## 2021-05-15 NOTE — TOC Progression Note (Signed)
Transition of Care [facility]) - Progression Note    Patient Details  Name: Krista Maddox MRN: [id number] Date of Birth: 1986/05/17  Transition of Care [facility]) CM/SW Contact  Lockie Pares, RN Phone Number: 05/15/2021, 3:31 PM  Clinical Narrative:     Call from Tammy at [facility] rehab, patient has been accepted.  Call report at [phone] when she gets there they will assign a bed CSW and RN aware  Expected Discharge Plan: Skilled Nursing Facility Barriers to Discharge: Continued Medical Work up,Insurance Authorization  Expected Discharge Plan and Services Expected Discharge Plan: Skilled Nursing Facility       Living arrangements for the past 2 months: Single Family Home                                       Social Determinants of Health (SDOH) Interventions    Readmission Risk Interventions No flowsheet data found.

## 2021-05-15 NOTE — Discharge Summary (Addendum)
Stroke Discharge Summary  Patient ID: Krista Maddox   MRN: [id number]      DOB: [birth year]  Date of Admission: 04/02/2021 Date of Discharge: 05/15/2021  Attending Physician:  Dr. Marvel Plan Consultant(s):   Dr. Violeta Gelinas, Trauma surgery for PEG Placement, Dr. Corliss Skains for thrombectomy  Patient's PCP:  Maud Deed, PA  Discharge Diagnoses:  1.Recurrent cryptogenic embolic strokes -Acute infarct of the R MCA due to right M1 occlusion s/p IR with TICI2c,cryptogenic etiologynow on coumadin therapy 2. Hx of cryptogenic strokes 3. Hyperlipidemia 4. Dysphagia  5. Depression 6. Morbid Obesity  Active Problems:   Acute ischemic right MCA stroke (HCC)   Stroke [facility])   Middle cerebral artery embolism, right   Medications to be continued on Rehab Allergies as of 05/15/2021      Reactions   Tape Rash   Cannot tolerate paper tape      Medication List    STOP taking these medications   Aimovig 140 MG/ML Soaj Generic drug: Erenumab-aooe   apixaban 5 MG Tabs tablet Commonly known as: ELIQUIS   aspirin EC 325 MG tablet   co-enzyme Q-10 50 MG capsule   gabapentin 300 MG capsule Commonly known as: NEURONTIN   magnesium oxide 400 (240 Mg) MG tablet Commonly known as: MAG-OX   meclizine 12.5 MG tablet Commonly known as: ANTIVERT   medroxyPROGESTERone 150 MG/ML injection Commonly known as: DEPO-PROVERA   meloxicam 15 MG tablet Commonly known as: MOBIC   nortriptyline 50 MG capsule Commonly known as: PAMELOR   Nurtec 75 MG Tbdp Generic drug: Rimegepant Sulfate   promethazine 25 MG tablet Commonly known as: PHENERGAN   Riboflavin 100 MG Tabs   venlafaxine XR 150 MG 24 hr capsule Commonly known as: EFFEXOR-XR Replaced by: venlafaxine 75 MG tablet     TAKE these medications   acetaminophen 650 MG suppository Commonly known as: TYLENOL Place 1 suppository (650 mg total) rectally every 4 (four) hours as needed for mild pain (or temp > 37.5 C (99.5  F)).   acetaminophen 160 MG/5ML solution Commonly known as: TYLENOL Place 20.3 mLs (650 mg total) into feeding tube every 4 (four) hours as needed for mild pain (or temp > 37.5 C (99.5 F)).   acetaminophen 325 MG tablet Commonly known as: TYLENOL Take 2 tablets (650 mg total) by mouth every 4 (four) hours as needed for mild pain (or temp > 37.5 C (99.5 F)).   atenolol 25 MG tablet Commonly known as: TENORMIN Take 1 tablet (25 mg total) by mouth daily. Start taking on: May 16, 2021 What changed:   medication strength  how much to take   atorvastatin 80 MG tablet Commonly known as: LIPITOR Place 1 tablet (80 mg total) into feeding tube daily. Start taking on: May 16, 2021 What changed:   how to take this  Another medication with the same name was removed. Continue taking this medication, and follow the directions you see here.   bisacodyl 10 MG suppository Commonly known as: DULCOLAX Place 1 suppository (10 mg total) rectally daily as needed for moderate constipation.   ezetimibe 10 MG tablet Commonly known as: ZETIA Place 1 tablet (10 mg total) into feeding tube daily. Start taking on: May 16, 2021 What changed: how to take this   feeding supplement (OSMOLITE 1.5 CAL) Liqd Place 1,000 mLs into feeding tube daily.   feeding supplement (PROSource TF) liquid Place 90 mLs into feeding tube 2 (two) times daily.   free  water Soln Place 150 mLs into feeding tube every 4 (four) hours.   pantoprazole sodium 40 mg/20 mL Pack Commonly known as: PROTONIX Place 20 mLs (40 mg total) into feeding tube daily. Start taking on: May 16, 2021   polyethylene glycol 17 g packet Commonly known as: MIRALAX / GLYCOLAX Place 17 g into feeding tube daily. Start taking on: May 16, 2021   senna-docusate 8.6-50 MG tablet Commonly known as: Senokot-S Place 1 tablet into feeding tube 2 (two) times daily.   topiramate 50 MG tablet Commonly known as: TOPAMAX Place 3 tablets (150 mg  total) into feeding tube 2 (two) times daily. What changed:   medication strength  See the new instructions.   venlafaxine 75 MG tablet Commonly known as: EFFEXOR Place 2 tablets (150 mg total) into feeding tube 2 (two) times daily. Replaces: venlafaxine XR 150 MG 24 hr capsule   warfarin 10 MG tablet Commonly known as: COUMADIN Take 1 tablet (10 mg total) by mouth every Monday, Wednesday, and Friday at 6 PM.   warfarin 7.5 MG tablet Commonly known as: COUMADIN Take 1 tablet (7.5 mg total) by mouth every Tuesday, Thursday, Saturday, and Sunday at 6 PM. Start taking on: May 16, 2021       LABORATORY STUDIES Lab Results  Component Value Date   INR 3.4 (H) 05/14/2021   INR 2.8 (H) 05/11/2021   INR 3.0 (H) 05/09/2021    CBC    Component Value Date/Time   WBC 8.4 05/11/2021 0440   RBC 4.11 05/11/2021 0440   HGB 11.6 (L) 05/11/2021 0440   HGB 13.9 07/26/2018 1610   HCT 37.6 05/11/2021 0440   HCT 41.5 07/26/2018 1610   PLT 252 05/11/2021 0440   PLT 244 07/26/2018 1610   MCV 91.5 05/11/2021 0440   MCV 94 07/26/2018 1610   MCH 28.2 05/11/2021 0440   MCHC 30.9 05/11/2021 0440   RDW 15.5 05/11/2021 0440   RDW 13.2 07/26/2018 1610   LYMPHSABS 2.3 04/11/2021 0241   LYMPHSABS 2.9 07/26/2018 1610   MONOABS 0.7 04/11/2021 0241   EOSABS 0.2 04/11/2021 0241   EOSABS 0.2 07/26/2018 1610   BASOSABS 0.0 04/11/2021 0241   BASOSABS 0.0 07/26/2018 1610   CMP    Component Value Date/Time   NA 137 05/11/2021 0440   NA 141 07/26/2018 1610   K 3.3 (L) 05/11/2021 0440   CL 107 05/11/2021 0440   CO2 24 05/11/2021 0440   GLUCOSE 102 (H) 05/11/2021 0440   BUN 16 05/11/2021 0440   BUN 10 07/26/2018 1610   CREATININE 0.62 05/11/2021 0440   CALCIUM 8.8 (L) 05/11/2021 0440   PROT 7.0 04/02/2021 1203   PROT 7.1 07/26/2018 1610   ALBUMIN 3.3 (L) 04/02/2021 1203   ALBUMIN 4.1 07/26/2018 1610   AST 18 04/02/2021 1203   ALT 14 04/02/2021 1203   ALKPHOS 88 04/02/2021 1203   BILITOT  0.7 04/02/2021 1203   BILITOT <0.2 07/26/2018 1610   GFRNONAA >60 05/11/2021 0440   GFRAA 109 07/26/2018 1610   COAGS Lab Results  Component Value Date   INR 3.4 (H) 05/14/2021   INR 2.8 (H) 05/11/2021   INR 3.0 (H) 05/09/2021   Lipid Panel    Component Value Date/Time   CHOL 93 04/03/2021 0622   CHOL 181 07/26/2018 1610   TRIG 79 04/03/2021 0622   HDL 29 (L) 04/03/2021 0622   HDL 35 (L) 07/26/2018 1610   CHOLHDL 3.2 04/03/2021 0622   VLDL 16 04/03/2021  0622   LDLCALC 48 04/03/2021 0622   LDLCALC 116 (H) 07/26/2018 1610   HgbA1C  Lab Results  Component Value Date   HGBA1C 5.7 (H) 04/03/2021   Urinalysis    Component Value Date/Time   COLORURINE YELLOW 04/09/2021 0832   APPEARANCEUR HAZY (A) 04/09/2021 0832   LABSPEC 1.025 04/09/2021 0832   PHURINE 6.0 04/09/2021 0832   GLUCOSEU NEGATIVE 04/09/2021 0832   HGBUR NEGATIVE 04/09/2021 0832   HGBUR negative 11/24/2009 0847   BILIRUBINUR NEGATIVE 04/09/2021 0832   BILIRUBINUR negative 07/26/2018 1534   KETONESUR NEGATIVE 04/09/2021 0832   PROTEINUR NEGATIVE 04/09/2021 0832   UROBILINOGEN 0.2 07/26/2018 1534   UROBILINOGEN 0.2 09/06/2017 1411   NITRITE NEGATIVE 04/09/2021 0832   LEUKOCYTESUR NEGATIVE 04/09/2021 0832   Urine Drug Screen     Component Value Date/Time   LABOPIA NONE DETECTED 08/18/2017 1723   COCAINSCRNUR NONE DETECTED 08/18/2017 1723   LABBENZ NONE DETECTED 08/18/2017 1723   AMPHETMU NONE DETECTED 08/18/2017 1723   THCU NONE DETECTED 08/18/2017 1723   LABBARB NONE DETECTED 08/18/2017 1723    Alcohol Level    Component Value Date/Time   ETH <10 01/18/2018 1742   SIGNIFICANT DIAGNOSTIC STUDIES EXAM: PORTABLE CHEST 1 VIEW on 04/07/2021  COMPARISON:  02/28/2015.  FINDINGS: Feeding tube noted with tip below left hemidiaphragm. Heart size normal. No focal infiltrate. Mild left perihilar and left base subsegmental atelectasis. No pleural effusion or pneumothorax.  IMPRESSION: 1.  Feeding  tube noted with tip below left hemidiaphragm.  2.  Mild left perihilar and left base subsegmental atelectasis.    CT HEAD WITHOUT CONTRAST FINDINGS: Brain: Unchanged appearance of large right MCA territory infarct with areas of hyper density along the course of the right MCA. Old left parietotemporal infarct is unchanged. Leftward midline shift measures 4 mm.  Vascular: No hyperdense vessel or unexpected calcification.  Skull: Normal. Negative for fracture or focal lesion.  Sinuses/Orbits: Fluid in the right sphenoid sinus.  Other: None.  IMPRESSION: 1. Unchanged appearance of large right MCA territory infarct with 4 mm leftward midline shift.  2D Echo 04/03/2021    1. Left ventricular ejection fraction, by estimation, is 60 to 65%. The  left ventricle has normal function. The left ventricle has no regional  wall motion abnormalities. Left ventricular diastolic parameters were  normal.  2. Right ventricular systolic function is normal. The right ventricular  size is normal. Tricuspid regurgitation signal is inadequate for assessing  PA pressure.  3. The mitral valve is normal in structure. No evidence of mitral valve  regurgitation. No evidence of mitral stenosis.  4. The aortic valve is tricuspid. Aortic valve regurgitation is not  visualized. No aortic stenosis is present.  5. The inferior vena cava is normal in size with greater than 50%  respiratory variability, suggesting right atrial pressure of 3 mmHg.   FINDINGS  Left Ventricle: Left ventricular ejection fraction, by estimation, is 60  to 65%. The left ventricle has normal function. The left ventricle has no  regional wall motion abnormalities. The left ventricular internal cavity  size was normal in size. There is  no left ventricular hypertrophy. Left ventricular diastolic parameters  were normal.   Right Ventricle: The right ventricular size is normal. No increase in  right ventricular wall  thickness. Right ventricular systolic function is  normal. Tricuspid regurgitation signal is inadequate for assessing PA  pressure.   Left Atrium: Left atrial size was normal in size.   Right Atrium: Right atrial size was  normal in size.   Pericardium: There is no evidence of pericardial effusion.   Mitral Valve: The mitral valve is normal in structure. There is mild  calcification of the mitral valve leaflet(s). No evidence of mitral valve  regurgitation. No evidence of mitral valve stenosis.   Tricuspid Valve: The tricuspid valve is normal in structure. Tricuspid  valve regurgitation is not demonstrated.   Aortic Valve: The aortic valve is tricuspid. Aortic valve regurgitation is  not visualized. No aortic stenosis is present. Aortic valve mean gradient  measures 4.0 mmHg. Aortic valve peak gradient measures 8.2 mmHg. Aortic  valve area, by VTI measures 2.17  cm.   Pulmonic Valve: The pulmonic valve was normal in structure. Pulmonic valve  regurgitation is not visualized.   Aorta: The aortic root is normal in size and structure.   Venous: The inferior vena cava is normal in size with greater than 50%  respiratory variability, suggesting right atrial pressure of 3 mmHg.   IAS/Shunts: No atrial level shunt detected by color flow Doppler.     LEFT VENTRICLE  PLAX 2D  LVIDd:     4.10 cm   Diastology  LVIDs:     3.00 cm   LV e' medial:  11.90 cm/s  LV PW:     0.90 cm   LV E/e' medial: 9.3  LV IVS:    1.00 cm   LV e' lateral:  14.60 cm/s  LVOT diam:   1.90 cm   LV E/e' lateral: 7.6  LV SV:     58  LV SV Index:  26  LVOT Area:   2.84 cm    LV Volumes (MOD)  LV vol d, MOD A2C: 84.4 ml  LV vol d, MOD A4C: 82.5 ml  LV vol s, MOD A2C: 30.1 ml  LV vol s, MOD A4C: 29.4 ml  LV SV MOD A2C:   54.3 ml  LV SV MOD A4C:   82.5 ml  LV SV MOD BP:   54.3 ml   RIGHT VENTRICLE       IVC  RV S prime:   11.40 cm/s IVC diam: 1.10  cm  TAPSE (M-mode): 2.4 cm   LEFT ATRIUM       Index  LA diam:    3.30 cm 1.51 cm/m  LA Vol (A2C):  28.3 ml 12.94 ml/m  LA Vol (A4C):  26.9 ml 12.30 ml/m  LA Biplane Vol: 30.1 ml 13.77 ml/m  AORTIC VALVE  AV Area (Vmax):  2.22 cm  AV Area (Vmean):  2.37 cm  AV Area (VTI):   2.17 cm  AV Vmax:      143.00 cm/s  AV Vmean:     91.600 cm/s  AV VTI:      0.267 m  AV Peak Grad:   8.2 mmHg  AV Mean Grad:   4.0 mmHg  LVOT Vmax:     112.00 cm/s  LVOT Vmean:    76.500 cm/s  LVOT VTI:     0.204 m  LVOT/AV VTI ratio: 0.76    AORTA  Ao Root diam: 2.90 cm  Ao Asc diam: 2.70 cm   MITRAL VALVE  MV Area (PHT): 3.60 cm   SHUNTS  MV Decel Time: 211 msec   Systemic VTI: 0.20 m  MV E velocity: 111.00 cm/s Systemic Diam: 1.90 cm  MV A velocity: 85.10 cm/s  MV E/A ratio: 1.30   Marca Ancona MD  Electronically signed by Marca Ancona MD  Signature Date/Time: 04/03/2021/4:15:14 PM  Final     Priority: Routine    HISTORY OF PRESENT ILLNESS [name] a 35 y.o.femalewith history of multiple strokes who presented as a code stroke with symptoms suggestive of LVO. tPA was not given due to Eliquis. CTA head and neck revealed an occlusion of distal right M2 MCA with partial reconstitution and subsequent reocclusion of most M2 branches. Patient underwent thrombectomy.   HOSPITAL COURSE Krista Maddox was admitted to the neuorologic ICU and monitored there post per post thrombectomy protocol. She remained hemodynamically and neurologically stable with slow neurologic improvement. She was transferred to the neurologic floor and has been awaiting placement for about 4-5 weeks. Her hospital problem list is presented in a problem based format below:  Recurrent cryptogenic embolic strokes -Acute infarct of the R MCA due to right M1 occlusion s/p IR with TICI2c,cryptogenic etiology  Code Stroke:CT  head No acuteintracranial hemorrhage.Acute infarcts in the right MCAterritory.ASPECTS8.   CTA head & neckOcclusion of distal right M1 MCA with partial reconstitution and subsequent reocclusion of most M2 branches. Poor collaterals.  CTPerfusion imaging demonstrates core infarction 99 mL and penumbra of 34 mL. The extent of core infarction is calculated at greater than apparent loss of gray-white differentiation on noncontrast head CT. Therefore, penumbra may be greater.  2D EchoLVEF 60-65%, no structural abnormalities noted that could increase risk for CVA  Repeat CT Head 4/24-large left MCA infarct, no midline shift. Small SAH sylvian fissure, diminishing in size  Hypercoagulable panel: Homocysteine 15.1 due to smoking, otherwise negative to date  LDL48  HgbA1c5.7  VTE prophylaxis -SCDs+ Lovenox  aspirin 325 mg daily and Eliquis (apixaban) dailyprior to admission, now on coumadin (pharmacy managed) with INR goal 2.5-3.5. Please continue to monitor INR and adjust coumadin as neeeded  INR 3.0->2.8->3.4  Therapy recommendations:SNF vs. IPR  Disposition:Discharge today to [facility]  Hx of cryptogenic strokes  In 08/2017 patient admitted for left sided numbness and headache. MRI showed right cerebellum, right temporoparietal small infarcts. EF 55 to 60%, CT head and neck unremarkable. LDL 89, A1c 4.8. TCD bubble study showed trivial PFO. DVT negative. TEE no PFO. Hypercoagulable and autoimmune work-up negative. Discharged with aspirin and Lipitor.  01/2018 episode of left facial numbness and left leg weakness. CT head and neck unremarkable. MRI negative.  [facility]. MRI showed right tiny parietal infarct.  [facility]. CTA neck showed left M2 occlusion. MRI showed left MCA and punctate right frontal infarct. TCD bubble study potential trivial PFO, TEE tiny  PFO.  11/2020 she was referred to loop recorder which was not done as physician thought she should be candidate for anticoagulation. Started on Eliquis 5 mg twice daily.  Skin rash with blisters, resolved a month ago   Left flank skin lesion with blisters, concerning for shingles  Varicella zoster PCR negative, spoke with IC RN and precautions discontinued 5/6.   Off contact and airborne isolation; resolved  Valtrex discontinued  Hyperlipidemia  Home meds:atrovastatin 80mg  and zetia 10mg    Will restart post CT if stable  LDL 48, goal < 70  Resumed lipitor 80 and zetia 10  Continue statin at discharge  Dysphagia   S/p PEG Placement   SLP and RD following   Now on Regular thin liquid  Low po intake, still on TF  Trial of nocturnal feedings underway  Birth control  Recommend OP PCP or OBGYN discontinue Depo - provera due to numerous  Contraindications including hx of tobacco use, migraines, and multiple strokes.  Tobacco abuse  Current smoker  Smoking cessation counselingwill beprovided  Contraindicated for birth control Depo  Hx of pseudotumor cerebri Chronic migraine  Follows with Dr. 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  Depression  Depressed mood after father and daughter left  Has increase Effexor to 150mg  bid  Other Stroke Risk Factors  MorbidObesity,Body mass index is 42.12 kg/m., BMI >/= 30 associated with increased stroke risk, recommend weight loss, diet and exercise as appropriate   Obstructive sleep apnea  Tachycardia - resolved now, on atenolol 25mg  daily (pt has home atenolol 50mg  daily)  DISCHARGE EXAM Blood pressure 115/72, pulse 99, temperature 98.6 F (37 C), temperature source Oral, resp. rate 17, height 5\' 5"  (1.651 m), weight 111.2 kg, SpO2 99 %.   General- morbid obesity, well developed, in no apparent distress.  Sitting up in bed watching tv. Greets examiner with "hey".  Resp: No extra work of breathing.  Cardiovascular - Regular rhythm and rate. Abd: soft, round, NTTP, Non-distended  Ext: warm and well perfused  Neuro -Eyes open, awake alert, attempts to speak are mostly dysarthric, occasionally able to speak clearly,  not following commands. Able to participate with EOMs: Right gaze preference, but able to cross midline and left gaze incomplete. Blinking to visual threat bilaterally. Left facial droop. Left upper extremity 0/5 but increased muscle tone, left LE slight withdraw to pain but also with increased muscle tone.Right upper extremity spontaneous movement against gravity. Right lower extremity at least 3/5 spontaneously. Sensation, coordination and gait not able to be tested.  Discharge Diet      Diet   Diet regular Room service appropriate? No; Fluid consistency: Thin   liquids With nocturnal tube feedings:  Transition to nocturnal tube feeds via PEG: - Osmolite 1.5 @ 75 ml/hr to run for 12 hours from 1800 to 0600 (total of 900 ml) - ProSource TF 90 ml BID - Free water flushes of 150 ml q 4 hours  Nocturnal tube feeding regimen provides 1510 kcal, 100 grams of protein, and 686 ml of H2O (meets 79% of kcal needs and 91% of protein needs).  Total free water with flushes: 1586 ml  DISCHARGE PLAN  Disposition:  Transfer to [facility] for ongoing PT, OT and ST  Warfarin therapy for long term anticoagulation is recommended.   Recommend ongoing stroke risk factor control by Primary Care Physician at time of discharge from inpatient rehabilitation.  Follow-up PCP , PA in 2 weeks following discharge from rehab.  Follow-up in [facility] in 4 weeks following discharge from rehab, office to schedule an appointment.   35 minutes were spent preparing discharge.  Delila A Bailey-Modzik, NP-C  ATTENDING NOTE: I  reviewed above note and agree with the assessment and plan. Pt was seen and examined.   No acute event overnight, patient neuro stable, unchanged.  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  On Coumadin INR goal 2.5-3.5.  Currently has CIR bed at [facility], ready for discharge.  For detailed assessment and plan, please refer to above as I have made changes wherever appropriate.   , MD PhD Stroke Neurology 05/15/2021 6:38 PM

## 2021-05-20 NOTE — Therapy (Signed)
[facility] [address] [place], [place], [address] Phone: [phone]   Fax:  [phone]  Patient Details  Name: Krista Maddox MRN: [id number] Date of Birth: [birth year] Referring Provider:  Suzanna Obey, MD  Encounter Date: 05/20/2021   SPEECH THERAPY DISCHARGE SUMMARY  Visits from Start of Care: 15  Current functional level related to goals / functional outcomes: [name] did not return to OPST as pt was hospitalized for stroke. Pt had made significant gains in OPST evaluation with improved aphasia and apraxia of speech, with increased verbal output and decreased reliance on external aids for communication. Pt was discharged from OPST due to change in condition resulting in hospitalization.    Remaining deficits: Apraxia and aphasia   Education / Equipment: Anomia and apraxia compensations, communication strategies, HEP   Plan: Patient agrees to discharge.  Patient goals were partially met. Patient is being discharged due to a change in medical status.  ?????       SLP Short Term Goals - 02/23/21 1014              SLP SHORT TERM GOAL #1    Title Complete formal aphasia assessment     Baseline AQ 70.2/100 - moderate aphasia     Period --   or 3 total sessions    Status Achieved          SLP SHORT TERM GOAL #2    Title pt will produce sentence responses with compensations 80% success     Baseline 70% accurate with min to mod A     Period --   or 9 total sesions, for all STGs    Status Partially Met          SLP SHORT TERM GOAL #3    Title pt language will improve in order to have pt report communicative effectiveness score to >/= 15     Baseline 13     Status Achieved          SLP SHORT TERM GOAL #4    Title pt will report ability to participate in simple telephone conversations with strangers such as Product manager or other service reps or medical personnel with extra  time and compensations     Baseline pt used pre-scripted information for aphasia group and MD apt with reported success     Status Achieved                  SLP Long Term Goals - 03/31/21 2348              SLP LONG TERM GOAL #1    Title pt language will improve in order to have pt report communicative effectiveness score to >/= 20     Baseline 13     Time 19     Period --   visits (19 total), for all LTGs    Status On-going          SLP LONG TERM GOAL #2    Title pt will produce functional communication to communicate simple messages salient for pt in 3 sessions with modified independence (compensations)     Baseline 02-04-21, 02-16-21     Status Achieved          SLP LONG TERM GOAL #3    Title pt will demonstrate understanding of longer auditory messages with modified independence (compensations) such as sermons     Baseline note taking     Time 19  Period --   total visits    Status On-going          SLP LONG TERM GOAL #4    Title pt will participate functionally in 10 mintues mod complex-complex conversation over 3 sessions     Baseline 03-16-21     Time 19     Period --   total visits    Status On-going           V , MA CCC-SLP 05/20/2021, 4:22 PM  [facility] [address] [place], [place], [address] Phone: [phone]   Fax:  [phone] 

## 2021-08-25 ENCOUNTER — Other Ambulatory Visit: Payer: Self-pay

## 2021-08-25 NOTE — Patient Outreach (Signed)
[facility]) Care Management  08/25/2021  Krista Maddox 07-05-1986 [id number]   First telephone outreach attempt to obtain mRS. No answer. Left message for returned call.  [name] [facility] Management Assistant [phone]

## 2021-08-26 ENCOUNTER — Other Ambulatory Visit: Payer: Self-pay

## 2021-08-26 NOTE — Patient Outreach (Signed)
[facility]) Care Management  08/26/2021  Krista Maddox Jul 19, 1986 [id number]   Second telephone outreach attempt to obtain mRS. No answer. Left message for returned call.  [name] [facility] Management Assistant [phone]

## 2021-08-28 ENCOUNTER — Other Ambulatory Visit: Payer: Self-pay

## 2021-08-28 NOTE — Patient Outreach (Signed)
[facility]) Care Management  08/28/2021  Krista Maddox 09/20/1986 [id number]   3 outreach attempts were completed to obtain mRs. mRs could not be obtained because patient never returned my calls. mRs=7    [name] Care Management Assistant [phone]

## 2022-12-12 IMAGING — CT CT HEAD W/O CM
4 series · 16 of 47 positions shown, 18 images · non-contrast
Comparison: Yesterday

CLINICAL DATA: Stroke follow-up

EXAM:
CT HEAD WITHOUT CONTRAST
TECHNIQUE: Contiguous axial images were obtained from the base of the skull
through the vertex without intravenous contrast.

[Series 3: head wo · axial · 0.41mm/px · z∈[-141,-21]mm · 7 of 34 slices shown, 9 images]
[im 5/34  brain]
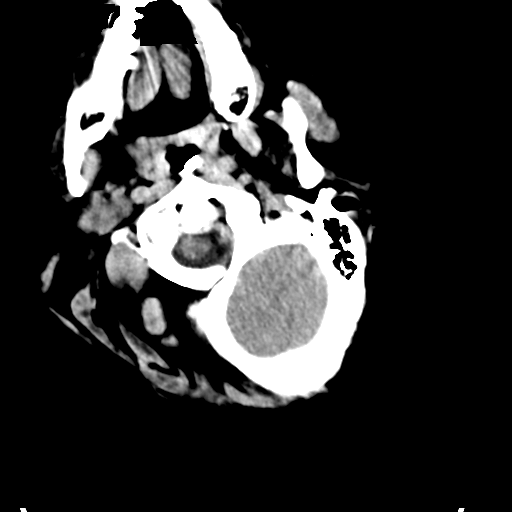
[im 5/34  bone]
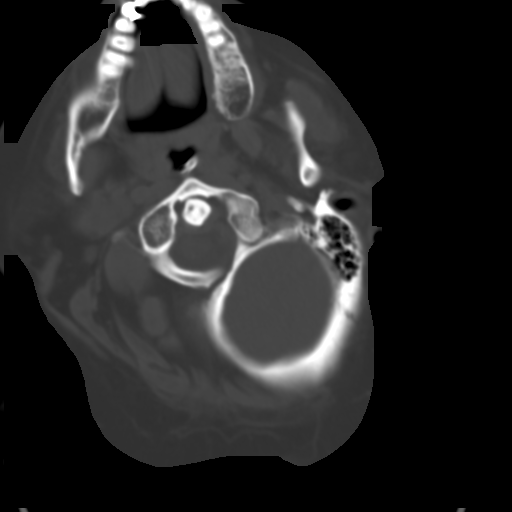
[im 9/34  brain]
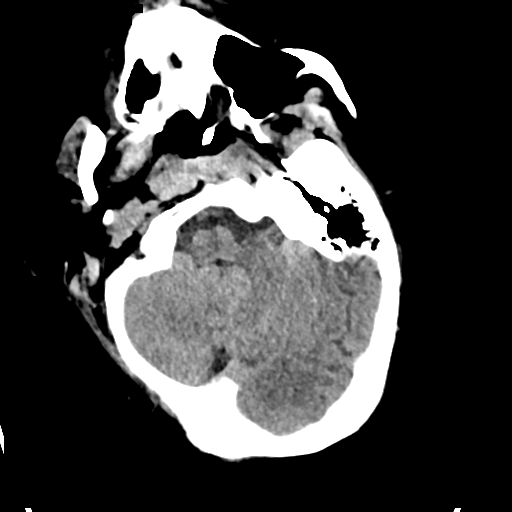
[im 13/34  brain]
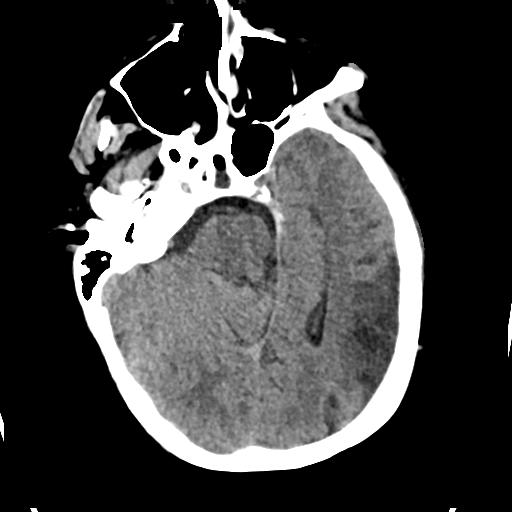
[im 17/34  brain]
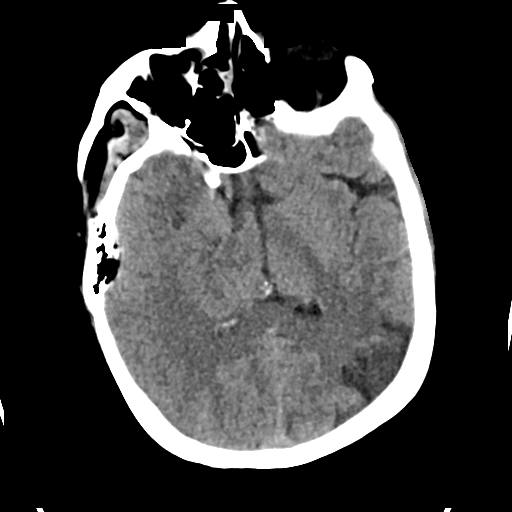
[im 21/34  brain]
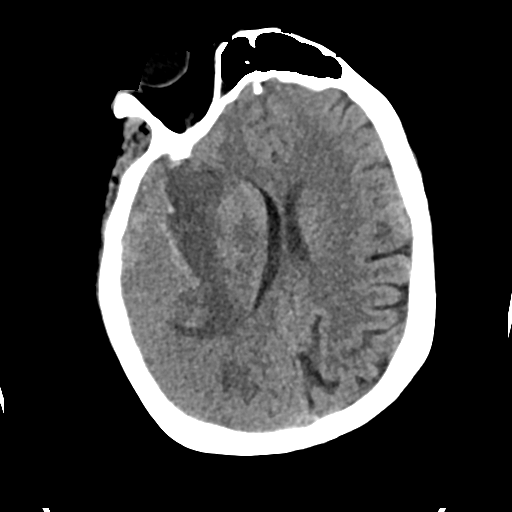
[im 21/34  bone]
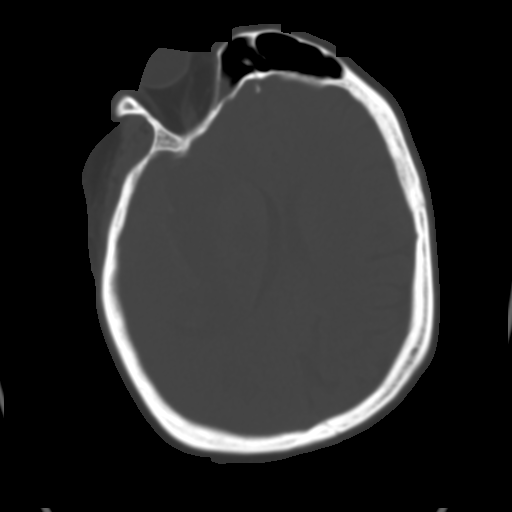
[im 25/34  brain]
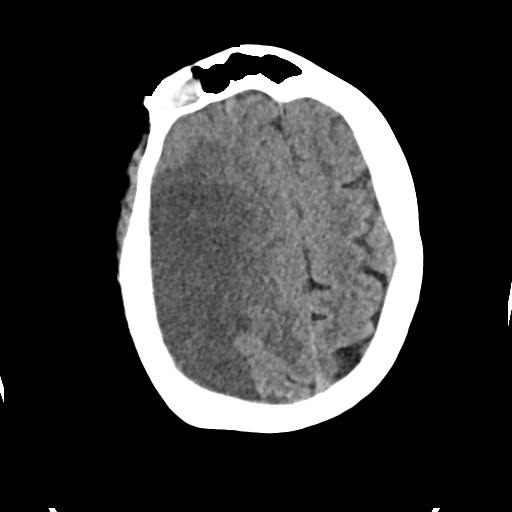
[im 29/34  brain]
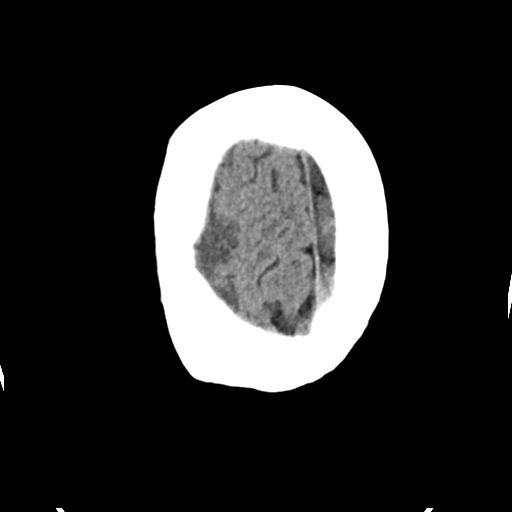

[Series 4: head bone · axial · 0.51mm/px · z∈[-145,-111]mm · 3 of 85 slices shown]
[im 9/85  bone]
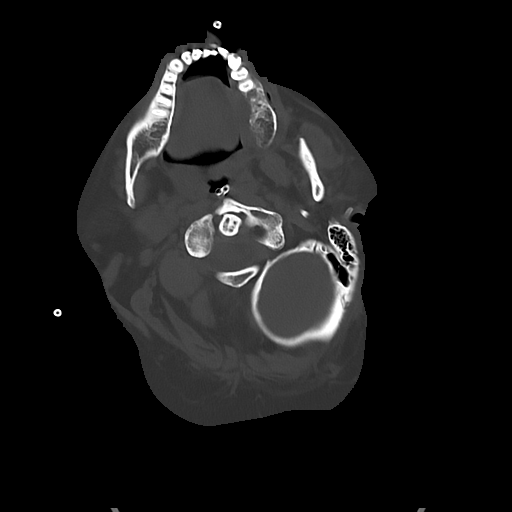
[im 17/85  bone]
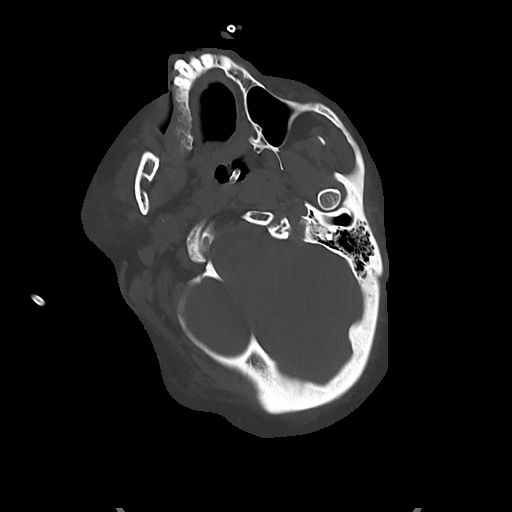
[im 26/85  bone]
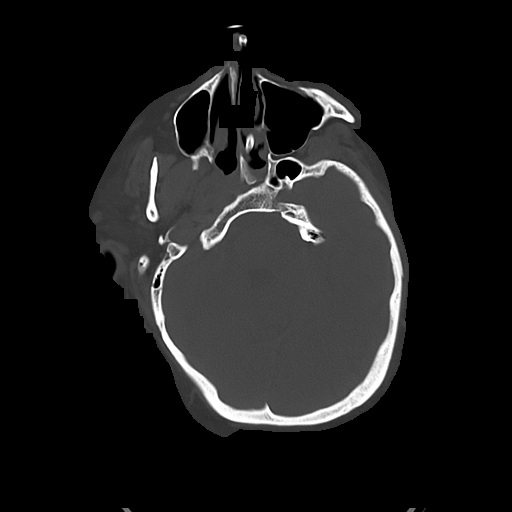

[Series 5: cor soft · coronal · 0.37mm/px · 3 of 76 slices shown]
[im 30/76  brain]
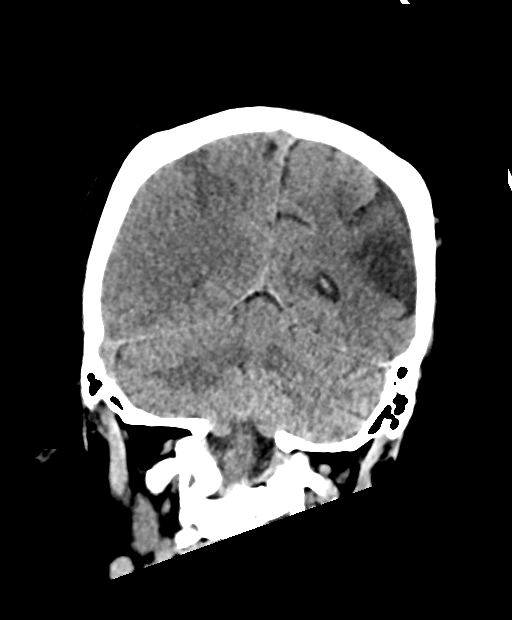
[im 35/76  brain]
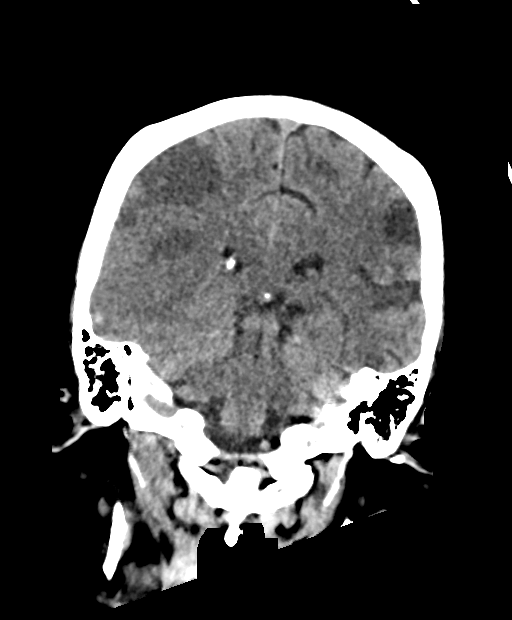
[im 41/76  brain]
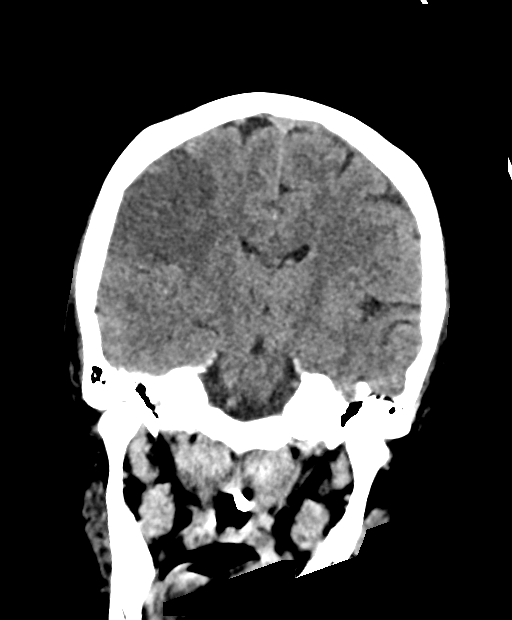

[Series 6: sag soft · sagittal · 0.44mm/px · 3 of 60 slices shown]
[im 26/60  brain]
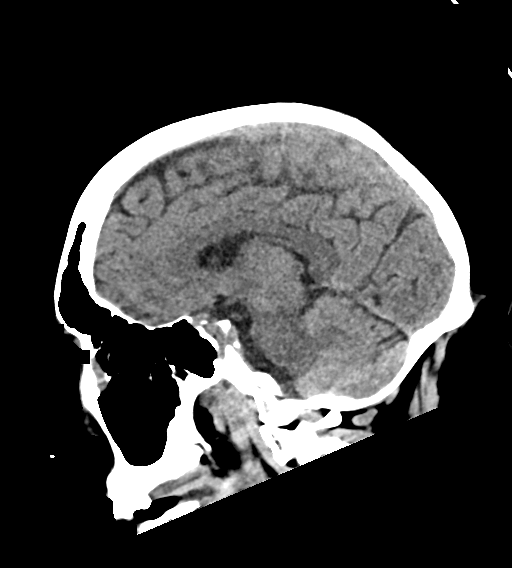
[im 30/60  brain]
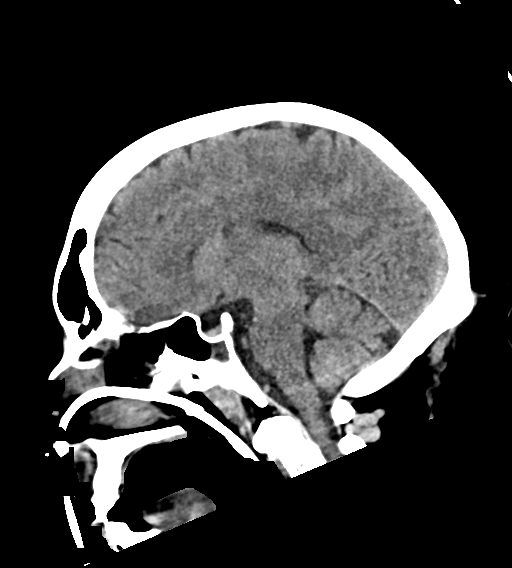
[im 34/60  brain]
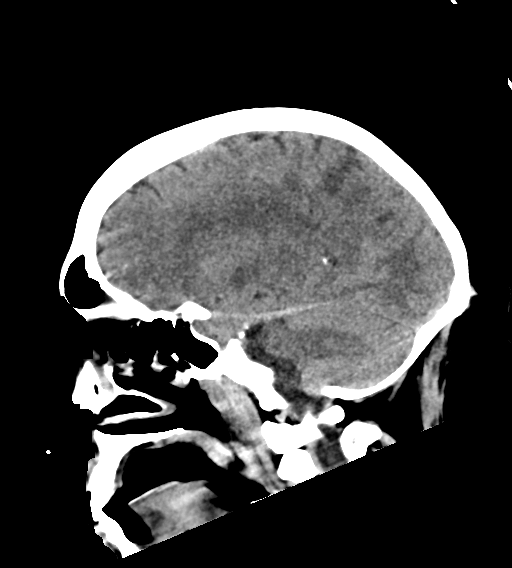

[16 of 47 positions shown; findings below may reference images not displayed]

FINDINGS: Brain: Large acute right MCA territory infarct with most
well-defined cytotoxic edema in the upper division extending into
the basal ganglia. Inferior division cortical cytotoxic edema is
also noted and could be progressive. Remote left MCA branch infarct
affecting the parietal and posterior temporal cortex. Small volume
subarachnoid hemorrhage at the right lower sylvian fissure. No
hydrocephalus or midline shift.

Vascular: Limited at the right MCA due to adjacent hemorrhage.

Skull: Negative

Sinuses/Orbits: Negative
IMPRESSION: 1. Large acute right MCA territory infarct. There may be progressive
cortical infarction in the inferior division.
2. Diminished contrast/blood extravasation at the right sylvian
fissure.
3. No midline shift.

## 2023-02-15 ENCOUNTER — Encounter (INDEPENDENT_AMBULATORY_CARE_PROVIDER_SITE_OTHER): Payer: Self-pay

## 2023-02-15 ENCOUNTER — Telehealth: Payer: Self-pay

## 2023-02-15 ENCOUNTER — Other Ambulatory Visit: Payer: Medicaid Other

## 2023-02-15 DIAGNOSIS — Z515 Encounter for palliative care: Secondary | ICD-10-CM

## 2023-02-15 NOTE — Progress Notes (Signed)
COMMUNITY PALLIATIVE CARE SW NOTE  PATIENT NAME: Krista Maddox DOB: 1986/08/13 MRN: [id number]  PRIMARY CARE PROVIDER: Willeen Niece, PA  RESPONSIBLE PARTY:  Acct ID - Guarantor Home Phone Work Phone Relationship Acct Type  [id number] GALAXY, [phone]  Self P/F     [address], [place], [place] [address]   Initial Palliative Care Encounter/Clinical Social Work  [facility] SW completed a telephonic encounter with patient's mother Krista Maddox. SW introduced palliative care services to Krista Maddox, educating her on palliative care services, role in patient's care and that visits outside of [facility] will be conducted virtually. Krista Maddox agreed and consented to palliative care services. SW obtained a status update on patient.   Krista Maddox advised that [name] has the flu and a low grade temperature. She called patient's doctor to request an antibiotic, but has not heard anything back. She report that patient also has a very congested cough. Patient is bed ridden and is unable to talk. Krista Maddox is hoping that patient will be able to start PT, OT, and speech through [facility]. [facility] signed off  Krista Maddox advised that patient was in a SNF and they brought her home due to care concerns at the facility. SW provided education regarding PCS and [facility] services for patient. Patient is a medicaid recipient and agreed for SW to complete a PCS application. SW provided education on the [facility] application process and she verbalized understanding.   Social History: Patient has had a history of stroke. She has a 56 year old daughter. She lives in the home with her mother and stepfather. Social History   Tobacco Use   Smoking status: Former    Packs/day: 0.50    Types: Cigarettes    Quit date: 08/27/2017    Years since quitting: 5.4   Smokeless tobacco: Never  Substance Use Topics   Alcohol use: No    Alcohol/week: 0.0 standard drinks of alcohol    CODE STATUS: Full Code ADVANCED DIRECTIVES: No MOST FORM COMPLETE:   No HOSPICE EDUCATION PROVIDED: No  Duration of visit and documentation: 30 minutes  [employer], LCSW

## 2023-02-15 NOTE — Telephone Encounter (Signed)
NA

## 2023-02-18 ENCOUNTER — Other Ambulatory Visit: Payer: Medicaid Other

## 2023-02-18 DIAGNOSIS — Z515 Encounter for palliative care: Secondary | ICD-10-CM

## 2023-02-18 NOTE — Progress Notes (Signed)
PATIENT NAME: Krista Maddox DOB: 12/18/1985 MRN: [id number]  PRIMARY CARE PROVIDER: Willeen Niece, PA  RESPONSIBLE PARTY:  Acct ID - Guarantor Home Phone Work Phone Relationship Acct Type  [id number] YVONNDA, STERNER* [phone]  Self P/F     [address] [place], [place],  [address]   Palliative Care Follow Up Telephone Encounter Note   I connected with Angela Burke the mother of SHAWNDEL SENESAC on 02/18/23 by telephone and verified that I am speaking with the correct person using two identifiers.       HISTORY OF PRESENT ILLNESS:    Respiratory: some SOB from time to time because she has the flu  Cognitive: alert   Appetite: not eating as   GI/GU: no issues at this time   Mobility: bedbound  ADLs: dependent on caregivers    Palliative Care/ Hospice: RN explained role and purpose of palliative care including visit frequency.  Goals of Care: To stay in the home    CODE STATUS: Full code ADVANCED DIRECTIVES: N MOST FORM: N    PHYSICAL EXAM:   VITALS:There were no vitals filed for this visit.    Explained that visits for Palliative Care outside of [facility] are virtual/telephonic. Clarise Cruz voiced understanding and then asked if we could come to the home to assist in the patient's physical care. Clarise Cruz also asked about PCS services and I have notified Palliative Care SW to reconnect with Clarise Cruz to give an update.    Charniece Venturino Georgann Housekeeper, LPN

## 2023-04-01 ENCOUNTER — Telehealth: Payer: Self-pay

## 2023-04-01 NOTE — Telephone Encounter (Signed)
(  111:10 am) PC SW attempted to contact patient's mother to find out status of PCS application completed by SW. SW received a voicemail and requested a call back.

## 2023-04-22 ENCOUNTER — Telehealth: Payer: Self-pay

## 2023-04-22 NOTE — Telephone Encounter (Signed)
(  2:35 pm) PC SW attempted to contact patient's mother and father-[name] to no avail. SW has made several attempts to contact patient's mother/PCG.  Patient will be moved to inactive status, however support is available if/when the family decides to reach out to [facility] team.

## 2023-05-12 ENCOUNTER — Encounter (INDEPENDENT_AMBULATORY_CARE_PROVIDER_SITE_OTHER): Payer: Self-pay

## 2023-11-01 ENCOUNTER — Emergency Department (HOSPITAL_COMMUNITY): Payer: Medicare Other

## 2023-11-01 ENCOUNTER — Inpatient Hospital Stay (HOSPITAL_COMMUNITY)
Admission: EM | Admit: 2023-11-01 | Discharge: 2023-11-05 | DRG: 057 | Disposition: A | Discharge status: Home / Self Care | Payer: Medicare Other | Attending: Family Medicine | Admitting: Family Medicine

## 2023-11-01 ENCOUNTER — Other Ambulatory Visit: Payer: Self-pay

## 2023-11-01 DIAGNOSIS — G43909 Migraine, unspecified, not intractable, without status migrainosus: Secondary | ICD-10-CM | POA: Diagnosis present

## 2023-11-01 DIAGNOSIS — Z9049 Acquired absence of other specified parts of digestive tract: Secondary | ICD-10-CM

## 2023-11-01 DIAGNOSIS — Z833 Family history of diabetes mellitus: Secondary | ICD-10-CM

## 2023-11-01 DIAGNOSIS — I69398 Other sequelae of cerebral infarction: Secondary | ICD-10-CM

## 2023-11-01 DIAGNOSIS — R1310 Dysphagia, unspecified: Secondary | ICD-10-CM | POA: Diagnosis present

## 2023-11-01 DIAGNOSIS — R531 Weakness: Secondary | ICD-10-CM | POA: Diagnosis not present

## 2023-11-01 DIAGNOSIS — R202 Paresthesia of skin: Secondary | ICD-10-CM | POA: Diagnosis present

## 2023-11-01 DIAGNOSIS — Z91048 Other nonmedicinal substance allergy status: Secondary | ICD-10-CM

## 2023-11-01 DIAGNOSIS — Z7401 Bed confinement status: Secondary | ICD-10-CM

## 2023-11-01 DIAGNOSIS — Z8041 Family history of malignant neoplasm of ovary: Secondary | ICD-10-CM

## 2023-11-01 DIAGNOSIS — E785 Hyperlipidemia, unspecified: Secondary | ICD-10-CM | POA: Diagnosis present

## 2023-11-01 DIAGNOSIS — Z931 Gastrostomy status: Secondary | ICD-10-CM

## 2023-11-01 DIAGNOSIS — G9389 Other specified disorders of brain: Secondary | ICD-10-CM | POA: Diagnosis present

## 2023-11-01 DIAGNOSIS — Z6841 Body Mass Index (BMI) 40.0 and over, adult: Secondary | ICD-10-CM

## 2023-11-01 DIAGNOSIS — Q2112 Patent foramen ovale: Secondary | ICD-10-CM

## 2023-11-01 DIAGNOSIS — E669 Obesity, unspecified: Secondary | ICD-10-CM | POA: Diagnosis present

## 2023-11-01 DIAGNOSIS — F419 Anxiety disorder, unspecified: Secondary | ICD-10-CM | POA: Diagnosis present

## 2023-11-01 DIAGNOSIS — Z8249 Family history of ischemic heart disease and other diseases of the circulatory system: Secondary | ICD-10-CM

## 2023-11-01 DIAGNOSIS — I10 Essential (primary) hypertension: Secondary | ICD-10-CM | POA: Diagnosis present

## 2023-11-01 DIAGNOSIS — H9190 Unspecified hearing loss, unspecified ear: Secondary | ICD-10-CM | POA: Diagnosis present

## 2023-11-01 DIAGNOSIS — Z79899 Other long term (current) drug therapy: Secondary | ICD-10-CM

## 2023-11-01 DIAGNOSIS — Z87891 Personal history of nicotine dependence: Secondary | ICD-10-CM

## 2023-11-01 DIAGNOSIS — F32A Depression, unspecified: Secondary | ICD-10-CM | POA: Diagnosis present

## 2023-11-01 DIAGNOSIS — I639 Cerebral infarction, unspecified: Secondary | ICD-10-CM | POA: Diagnosis present

## 2023-11-01 DIAGNOSIS — G8929 Other chronic pain: Secondary | ICD-10-CM | POA: Diagnosis present

## 2023-11-01 DIAGNOSIS — Z7901 Long term (current) use of anticoagulants: Secondary | ICD-10-CM

## 2023-11-01 DIAGNOSIS — I69354 Hemiplegia and hemiparesis following cerebral infarction affecting left non-dominant side: Secondary | ICD-10-CM

## 2023-11-01 DIAGNOSIS — I69851 Hemiplegia and hemiparesis following other cerebrovascular disease affecting right dominant side: Secondary | ICD-10-CM | POA: Diagnosis not present

## 2023-11-01 DIAGNOSIS — R488 Other symbolic dysfunctions: Secondary | ICD-10-CM | POA: Diagnosis present

## 2023-11-01 DIAGNOSIS — G8191 Hemiplegia, unspecified affecting right dominant side: Principal | ICD-10-CM | POA: Diagnosis present

## 2023-11-01 DIAGNOSIS — I69322 Dysarthria following cerebral infarction: Secondary | ICD-10-CM

## 2023-11-01 LAB — COMPREHENSIVE METABOLIC PANEL WITH GFR
ALT: 13 U/L (ref 0–44)
AST: 15 U/L (ref 15–41)
Albumin: 3.1 g/dL — ABNORMAL LOW (ref 3.5–5.0)
Alkaline Phosphatase: 77 U/L (ref 38–126)
Anion gap: 7 (ref 5–15)
BUN: 13 mg/dL (ref 6–20)
CO2: 23 mmol/L (ref 22–32)
Calcium: 8.5 mg/dL — ABNORMAL LOW (ref 8.9–10.3)
Chloride: 111 mmol/L (ref 98–111)
Creatinine, Ser: 0.83 mg/dL (ref 0.44–1.00)
GFR, Estimated: >60 mL/min
Glucose, Bld: 93 mg/dL (ref 70–99)
Potassium: 3.8 mmol/L (ref 3.5–5.1)
Sodium: 141 mmol/L (ref 135–145)
Total Bilirubin: 0.4 mg/dL
Total Protein: 7 g/dL (ref 6.5–8.1)

## 2023-11-01 LAB — I-STAT CHEM 8, ED
BUN: 13 mg/dL (ref 6–20)
Calcium, Ion: 1.08 mmol/L — ABNORMAL LOW (ref 1.15–1.40)
Chloride: 108 mmol/L (ref 98–111)
Creatinine, Ser: 0.9 mg/dL (ref 0.44–1.00)
Glucose, Bld: 90 mg/dL (ref 70–99)
HCT: 34 % — ABNORMAL LOW (ref 36.0–46.0)
Hemoglobin: 11.6 g/dL — ABNORMAL LOW (ref 12.0–15.0)
Potassium: 3.8 mmol/L (ref 3.5–5.1)
Sodium: 143 mmol/L (ref 135–145)
TCO2: 23 mmol/L (ref 22–32)

## 2023-11-01 LAB — DIFFERENTIAL
Abs Immature Granulocytes: 0.02 10*3/uL (ref 0.00–0.07)
Basophils Absolute: 0 10*3/uL (ref 0.0–0.1)
Basophils Relative: 1 %
Eosinophils Absolute: 0.1 10*3/uL (ref 0.0–0.5)
Eosinophils Relative: 1 %
Immature Granulocytes: 0 %
Lymphocytes Relative: 40 %
Lymphs Abs: 3.2 10*3/uL (ref 0.7–4.0)
Monocytes Absolute: 0.5 10*3/uL (ref 0.1–1.0)
Monocytes Relative: 6 %
Neutro Abs: 4.1 10*3/uL (ref 1.7–7.7)
Neutrophils Relative %: 52 %

## 2023-11-01 LAB — CBC
HCT: 34.9 % — ABNORMAL LOW (ref 36.0–46.0)
Hemoglobin: 10 g/dL — ABNORMAL LOW (ref 12.0–15.0)
MCH: 23.7 pg — ABNORMAL LOW (ref 26.0–34.0)
MCHC: 28.7 g/dL — ABNORMAL LOW (ref 30.0–36.0)
MCV: 82.7 fL (ref 80.0–100.0)
Platelets: 239 10*3/uL (ref 150–400)
RBC: 4.22 MIL/uL (ref 3.87–5.11)
RDW: 16.6 % — ABNORMAL HIGH (ref 11.5–15.5)
WBC: 8 10*3/uL (ref 4.0–10.5)
nRBC: 0 % (ref 0.0–0.2)

## 2023-11-01 LAB — ETHANOL: Alcohol, Ethyl (B): <10 mg/dL

## 2023-11-01 LAB — PROTIME-INR
INR: 1.1 (ref 0.8–1.2)
Prothrombin Time: 14.9 s (ref 11.4–15.2)

## 2023-11-01 LAB — APTT: aPTT: 31 s (ref 24–36)

## 2023-11-01 LAB — CBG MONITORING, ED: Glucose-Capillary: 90 mg/dL (ref 70–99)

## 2023-11-01 MED ORDER — IOHEXOL 350 MG/ML SOLN
75.0000 mL | Freq: Once | INTRAVENOUS | Status: AC | PRN
  Administered 2023-11-01: 75 mL via INTRAVENOUS

## 2023-11-01 NOTE — ED Provider Notes (Signed)
Care assumed at 2330 pending MRI MRI without acute abnormality or evidence of acute stroke.  Discussed with Dr. Amada Jupiter with neurology, recommendation for further evaluation given her comorbidities.  Medicine consulted for admission.   Tilden Fossa, MD 11/02/23 [phone]

## 2023-11-01 NOTE — ED Notes (Signed)
Mother Marena Chancy [phone] would like an update asap

## 2023-11-01 NOTE — ED Notes (Signed)
Patient back to room 22 from MRI

## 2023-11-01 NOTE — Consult Note (Signed)
NEUROLOGY CONSULT NOTE   Date of service: November 01, 2023 Patient Name: Krista Maddox MRN:  [id number] DOB:  [birth year] Chief Complaint: "CODE STROKE" Requesting Provider: No att. providers found  History of Present Illness  Krista Maddox is a 37 y.o. 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 EMS states that patient's family was asking her questions about how she felt using a white board, which is her main form of communication. Patient is not able to write on the board, but can nod yes/no and make noises in response. On exam, she has left hemiplegia and is  nonverbal (baseline), right side weakness, nods no to right side sensory questions, denies headache.  LKW: 1645 Modified rankin score: 4-5 IV Thrombolysis:  No (on Eliquis) EVT: No (No LVO) NIHSS: 18 (including baseline deficits)   ROS  Comprehensive ROS performed and pertinent positives documented in HPI   Past History   Past Medical History:  Diagnosis Date   DDD (degenerative disc disease), lumbar    Headache    Other disorders of optic nerve, not elsewhere classified, unspecified eye    Stroke (HCC)    TTP (thrombotic thrombocytopenic purpura)     Past Surgical History:  Procedure Laterality Date   CESAREAN SECTION     CHOLECYSTECTOMY     ESOPHAGOGASTRODUODENOSCOPY (EGD) WITH PROPOFOL N/A 04/16/2021   Procedure: ESOPHAGOGASTRODUODENOSCOPY (EGD) WITH PROPOFOL;  Surgeon: Violeta Gelinas, MD;  Location: [facility] ENDOSCOPY;  Service: General;  Laterality: N/A;   IR CT HEAD LTD  04/02/2021   IR PERCUTANEOUS ART THROMBECTOMY/INFUSION INTRACRANIAL INC DIAG ANGIO  04/02/2021   PEG PLACEMENT N/A 04/16/2021   Procedure: PERCUTANEOUS ENDOSCOPIC GASTROSTOMY (PEG) PLACEMENT;  Surgeon: Violeta Gelinas, MD;   Location: [facility] ENDOSCOPY;  Service: General;  Laterality: N/A;   RADIOLOGY WITH ANESTHESIA N/A 04/02/2021   Procedure: IR WITH ANESTHESIA;  Surgeon: Radiologist, Medication, MD;  Location: [facility] OR;  Service: Radiology;  Laterality: N/A;   TEE WITHOUT CARDIOVERSION N/A 08/22/2017   Procedure: TRANSESOPHAGEAL ECHOCARDIOGRAM (TEE);  Surgeon: Chilton Si, MD;  Location: [facility] ENDOSCOPY;  Service: Cardiovascular;  Laterality: N/A;   WISDOM TOOTH EXTRACTION      Family History: Family History  Problem Relation Age of Onset   Diabetes Mother    Hypertension Mother    Ovarian cancer Mother        Hysterectomy   Healthy Father    Ovarian cancer Maternal Grandmother        Hysterectomy    Social History  reports that she quit smoking about 6 years ago. She has never used smokeless tobacco. She reports that she does not drink alcohol and does not use drugs.  Allergies  Allergen Reactions   Tape Rash    Cannot tolerate paper tape    Medications  No current facility-administered medications for this encounter.  Current Outpatient Medications:    acetaminophen (TYLENOL) 160 MG/5ML solution, Place 20.3 mLs (650 mg total) into feeding tube every 4 (four) hours as needed for mild pain (or temp > 37.5 C (99.5 F))., Disp: 120 mL, Rfl: 0   acetaminophen (TYLENOL) 325 MG tablet, Take 2 tablets (650 mg total) by mouth every 4 (four) hours as needed for mild pain (or temp > 37.5 C (99.5 F))., Disp: , Rfl:    acetaminophen (TYLENOL) 650 MG suppository,  Place 1 suppository (650 mg total) rectally every 4 (four) hours as needed for mild pain (or temp > 37.5 C (99.5 F))., Disp: 12 suppository, Rfl: 0   atenolol (TENORMIN) 25 MG tablet, Take 1 tablet (25 mg total) by mouth daily., Disp: , Rfl:    atorvastatin (LIPITOR) 80 MG tablet, Place 1 tablet (80 mg total) into feeding tube daily., Disp: , Rfl:    bisacodyl (DULCOLAX) 10 MG suppository, Place 1 suppository (10 mg total) rectally daily as needed for  moderate constipation., Disp: 12 suppository, Rfl: 0   ezetimibe (ZETIA) 10 MG tablet, Place 1 tablet (10 mg total) into feeding tube daily., Disp: , Rfl:    Nutritional Supplements (FEEDING SUPPLEMENT, OSMOLITE 1.5 CAL,) LIQD, Place 1,000 mLs into feeding tube daily., Disp: , Rfl: 0   Nutritional Supplements (FEEDING SUPPLEMENT, PROSOURCE TF,) liquid, Place 90 mLs into feeding tube 2 (two) times daily., Disp: , Rfl:    pantoprazole sodium (PROTONIX) 40 mg/20 mL PACK, Place 20 mLs (40 mg total) into feeding tube daily., Disp: 30 mL, Rfl:    polyethylene glycol (MIRALAX / GLYCOLAX) 17 g packet, Place 17 g into feeding tube daily., Disp: 14 each, Rfl: 0   senna-docusate (SENOKOT-S) 8.6-50 MG tablet, Place 1 tablet into feeding tube 2 (two) times daily., Disp: , Rfl:    topiramate (TOPAMAX) 50 MG tablet, Place 3 tablets (150 mg total) into feeding tube 2 (two) times daily., Disp: , Rfl:    venlafaxine (EFFEXOR) 75 MG tablet, Place 2 tablets (150 mg total) into feeding tube 2 (two) times daily., Disp: , Rfl:    warfarin (COUMADIN) 10 MG tablet, Take 1 tablet (10 mg total) by mouth every Monday, Wednesday, and Friday at 6 PM., Disp: , Rfl:    warfarin (COUMADIN) 7.5 MG tablet, Take 1 tablet (7.5 mg total) by mouth every Tuesday, Thursday, Saturday, and Sunday at 6 PM., Disp: , Rfl:    Water For Irrigation, Sterile (FREE WATER) SOLN, Place 150 mLs into feeding tube every 4 (four) hours., Disp: , Rfl:   Vitals   Vitals:   07-Nov-2023 1800  Weight: 124.4 kg    Body mass index is 45.64 kg/m.  Physical Exam   Constitutional: Appears well-developed and well-nourished.  HHENT: [place]/AT Cardiovascular: Normal rate and regular rhythm.  Respiratory: Effort normal, non-labored breathing.   Neurologic Examination   Neuro: Mental Status: Patient is nonverbal at baseline.  She follows 1-step commands that are written on her white board.  Cranial Nerves: II: Blinks to threat bilaterally. Pupils are equal,  round, and reactive to light.   III,IV, VI: EOMI without ptosis or diploplia.  V: Facial sensation is symmetric to light touch VII: Facial movement is symmetric.  VIII: hearing is intact to voice X: Uvula elevates symmetrically XI: Shoulder shrug is symmetric. XII: tongue is midline without atrophy or fasciculations.  Motor: Tone is normal. Bulk is normal.  LUE/LLE: hemiplegia (baseline) RUE: 3/5, drift present RLE: 3/5, drift present Sensory: Sensation is reduced to light touch on right arm and leg.  Cerebellar: FNF and HKS: unable to perform   Labs/Imaging/Neurodiagnostic studies   CBC:  Recent Labs  Lab 11-07-23 1849  HGB 11.6*  HCT 34.0*   Basic Metabolic Panel:  Lab Results  Component Value Date   NA 143 Nov 07, 2023   K 3.8 November 07, 2023   CO2 24 05/11/2021   GLUCOSE 90 2023-11-07   BUN 13 Nov 07, 2023   CREATININE 0.90 11/07/2023   CALCIUM 8.8 (L) 05/11/2021  GFRNONAA >60 05/11/2021   GFRAA 109 07/26/2018   Lipid Panel:  Lab Results  Component Value Date   LDLCALC 48 04/03/2021   HgbA1c:  Lab Results  Component Value Date   HGBA1C 5.7 (H) 04/03/2021   Urine Drug Screen:     Component Value Date/Time   LABOPIA NONE DETECTED 08/18/2017 1723   COCAINSCRNUR NONE DETECTED 08/18/2017 1723   LABBENZ NONE DETECTED 08/18/2017 1723   AMPHETMU NONE DETECTED 08/18/2017 1723   THCU NONE DETECTED 08/18/2017 1723   LABBARB NONE DETECTED 08/18/2017 1723    Alcohol Level     Component Value Date/Time   ETH <10 01/18/2018 1742   INR  Lab Results  Component Value Date   INR 3.4 (H) 05/14/2021   APTT  Lab Results  Component Value Date   APTT 29 04/02/2021   AED levels: No results found for: "PHENYTOIN", "ZONISAMIDE", "LAMOTRIGINE", "LEVETIRACETA"  CT Head without contrast(Personally reviewed): No acute intracranial process.  Extensive encephalomalacia in the right MCA territory is consistent with evolution of the acute infarct seen on 04/05/2021.  CT  angio Head and Neck with contrast(Personally reviewed): No intracranial large vessel occlusion.  Moderate stenosis in the right M1.  MRI Brain: PENDING   ASSESSMENT   TOIA CHATELAIN is a 37 y.o. 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  Given her prior hx of cryptogenic strokes and reported and noted R sided weakness, recommend medicine admission with stroke workup.  RECOMMENDATIONS  - Frequent Neuro checks per stroke unit protocol - Recommend brain imaging with MRI Brain without contrast - Recommend obtaining TTE - Recommend obtaining Lipid panel with LDL - Please start statin if LDL > 70 - Recommend HbA1c to evaluate for diabetes and how well it is controlled. - continue eliquis. - SBP goal - permissive hypertension first 24 h < 220/110. Held home meds.  - Recommend Telemetry monitoring for arrythmia - Recommend bedside swallow screen prior to PO intake. - Stroke education booklet - Recommend PT/OT/SLP consult  _______________________________________________________________   Pt seen by Neuro NP/APP and later by MD. Note/plan to be edited by MD as needed.    Lynnae January, DNP, AGACNP-BC [facility] Please use AMION for contact information & EPIC for messaging.   NEUROHOSPITALIST ADDENDUM Performed a face to face diagnostic evaluation.   I have reviewed the contents of history and physical exam as documented by PA/ARNP/Resident and agree with above documentation.  I have discussed and formulated the above plan as documented. Edits to the note have been made as needed.  Impression/Key exam findings/Plan: extensive hx of cryptogenic strokes with negative workup and on eliquis and took her last dose this AM. She presents with R sided weakness  that is new. She is hard of hearing but able to answer written questions which makes getting history a bit cumbersome. With her bring on eliquis, she is not a candidate for tnkase. Her baseline is poor and she is bed bound and her CTA did not show an LVO. She is therefore not a candidate for thrombectomy. My suspicion for her having an acute stroke or TIA is high. Would recommend admission with stroke workup with MRI Brain.  Erick Blinks, MD [facility] [phone]   If 7pm to 7am, please call on call as listed on AMION.

## 2023-11-01 NOTE — Code Documentation (Signed)
Stroke Response Nurse Documentation Code Documentation  Krista Maddox is a 37 y.o. female arriving to [facility]  via [facility] on 11/01/2023 with past medical hx of CVA, nonverbal, hard of hearing, headaches. On warfarin daily. Code stroke was activated by EMS.   Patient from home where she was LKW at 1645 and now complaining of right sided weakness, right sided sensory changes, headache.   Stroke team at the bedside on patient arrival. Labs drawn. Patient to CT with team. NIHSS 12, see documentation for details and code stroke times. Patient with left arm weakness and left leg weakness on exam.   The following imaging was completed:  CT Head and CTA.   Patient is not a candidate for IV Thrombolytic. Patient is not a candidate for IR due to imaging negative for LVO.   Care Plan: q2h NIHSS and VS   Bedside handoff with ED RN Krista Maddox.    Krista Maddox  Rapid Response RN

## 2023-11-01 NOTE — ED Notes (Signed)
Patient to MRI.

## 2023-11-01 NOTE — ED Notes (Signed)
CCM called for tele monitoring, spoke with Jeannett Senior, patient connected for monitoring.

## 2023-11-01 NOTE — ED Triage Notes (Signed)
Patient brought in from home as a code stroke.  Patient has history of strokes and has left sided deficits, and is unable to speak at baseline.   Patient can nod yes or no appropriately to questions.    Per EMS reports LKW 1645, patient started with headache and right arm numbness and weakness, and report R hand contractures.    Patient is on eliqius.

## 2023-11-01 NOTE — ED Provider Notes (Signed)
[place] EMERGENCY DEPARTMENT AT [facility] Provider Note   CSN: [id number] Arrival date & time: 11/01/23  6440  An emergency department physician performed an initial assessment on this suspected stroke patient at 1841.  History  Chief Complaint  Patient presents with   Code Stroke    DARLANE TRIPLET is a 37 y.o. female.  Patient is a 37 year old female with past medical history of cryptogenic strokes and difficulty of hearing presenting for complaints of right upper extremity weakness and numbness.  Last known well unknown.  Patient has history of left sided sensation changes from previous strokes but states her right side is usually unaffected.  She denies any facial asymmetry or slurred speech.  Denies any lower extremity deficits.  Admits to headache.  Denies recent falls.  Eliquis.  Of note patient is extremely hard of hearing and I have to communicate with writing on paper.  This is her baseline.  The history is provided by the patient. No language interpreter was used.       Home Medications Prior to Admission medications   Medication Sig Start Date End Date Taking? Authorizing Provider  acetaminophen (TYLENOL) 160 MG/5ML solution Place 20.3 mLs (650 mg total) into feeding tube every 4 (four) hours as needed for mild pain (or temp > 37.5 C (99.5 F)). 05/15/21   Bailey-Modzik, Delila A, NP  acetaminophen (TYLENOL) 325 MG tablet Take 2 tablets (650 mg total) by mouth every 4 (four) hours as needed for mild pain (or temp > 37.5 C (99.5 F)). 05/15/21   Bailey-Modzik, Delila A, NP  acetaminophen (TYLENOL) 650 MG suppository Place 1 suppository (650 mg total) rectally every 4 (four) hours as needed for mild pain (or temp > 37.5 C (99.5 F)). 05/15/21   Bailey-Modzik, Delila A, NP  atenolol (TENORMIN) 25 MG tablet Take 1 tablet (25 mg total) by mouth daily. 05/16/21   Bailey-Modzik, Delila A, NP  atorvastatin (LIPITOR) 80 MG tablet Place 1 tablet (80 mg total) into feeding  tube daily. 05/16/21   Bailey-Modzik, Delila A, NP  bisacodyl (DULCOLAX) 10 MG suppository Place 1 suppository (10 mg total) rectally daily as needed for moderate constipation. 05/15/21   Bailey-Modzik, Delila A, NP  ezetimibe (ZETIA) 10 MG tablet Place 1 tablet (10 mg total) into feeding tube daily. 05/16/21   Bailey-Modzik, Delila A, NP  Nutritional Supplements (FEEDING SUPPLEMENT, OSMOLITE 1.5 CAL,) LIQD Place 1,000 mLs into feeding tube daily. 05/15/21   Bailey-Modzik, Delila A, NP  Nutritional Supplements (FEEDING SUPPLEMENT, PROSOURCE TF,) liquid Place 90 mLs into feeding tube 2 (two) times daily. 05/15/21   Bailey-Modzik, Delila A, NP  pantoprazole sodium (PROTONIX) 40 mg/20 mL PACK Place 20 mLs (40 mg total) into feeding tube daily. 05/16/21   Bailey-Modzik, Delila A, NP  polyethylene glycol (MIRALAX / GLYCOLAX) 17 g packet Place 17 g into feeding tube daily. 05/16/21   Bailey-Modzik, Delila A, NP  senna-docusate (SENOKOT-S) 8.6-50 MG tablet Place 1 tablet into feeding tube 2 (two) times daily. 05/15/21   Bailey-Modzik, Delila A, NP  topiramate (TOPAMAX) 50 MG tablet Place 3 tablets (150 mg total) into feeding tube 2 (two) times daily. 05/15/21   Bailey-Modzik, Delila A, NP  venlafaxine (EFFEXOR) 75 MG tablet Place 2 tablets (150 mg total) into feeding tube 2 (two) times daily. 05/15/21   Bailey-Modzik, Jillene Bucks A, NP  warfarin (COUMADIN) 10 MG tablet Take 1 tablet (10 mg total) by mouth every Monday, Wednesday, and Friday at 6 PM. 05/15/21   Bailey-Modzik,  Delila A, NP  warfarin (COUMADIN) 7.5 MG tablet Take 1 tablet (7.5 mg total) by mouth every Tuesday, Thursday, Saturday, and Sunday at 6 PM. 05/16/21   Bailey-Modzik, Delila A, NP  Water For Irrigation, Sterile (FREE WATER) SOLN Place 150 mLs into feeding tube every 4 (four) hours. 05/15/21   Bailey-Modzik, Ward Chatters, NP      Allergies    Tape    Review of Systems   Review of Systems  Constitutional:  Negative for chills and fever.  HENT:  Negative for ear pain  and sore throat.   Eyes:  Negative for pain and visual disturbance.  Respiratory:  Negative for cough and shortness of breath.   Cardiovascular:  Negative for chest pain and palpitations.  Gastrointestinal:  Negative for abdominal pain and vomiting.  Genitourinary:  Negative for dysuria and hematuria.  Musculoskeletal:  Negative for arthralgias and back pain.  Skin:  Negative for color change and rash.  Neurological:  Positive for weakness and numbness. Negative for seizures and syncope.  All other systems reviewed and are negative.   Physical Exam Updated Vital Signs BP 104/87   Pulse 87   Resp 15   Wt 124.4 kg   SpO2 100%   BMI 45.64 kg/m  Physical Exam Vitals and nursing note reviewed.  Constitutional:      General: She is not in acute distress.    Appearance: She is well-developed.  HENT:     Head: Normocephalic and atraumatic.  Eyes:     General: Lids are normal. Vision grossly intact.     Conjunctiva/sclera: Conjunctivae normal.     Pupils: Pupils are equal, round, and reactive to light.  Cardiovascular:     Rate and Rhythm: Normal rate and regular rhythm.     Heart sounds: No murmur heard. Pulmonary:     Effort: Pulmonary effort is normal. No respiratory distress.     Breath sounds: Normal breath sounds.  Abdominal:     Palpations: Abdomen is soft.     Tenderness: There is no abdominal tenderness.  Musculoskeletal:        General: No swelling.     Cervical back: Neck supple.  Skin:    General: Skin is warm and dry.     Capillary Refill: Capillary refill takes less than 2 seconds.  Neurological:     Mental Status: She is alert and oriented to person, place, and time.     GCS: GCS eye subscore is 4. GCS verbal subscore is 1. GCS motor subscore is 6.     Cranial Nerves: Cranial nerves 2-12 are intact.     Sensory: Sensory deficit present.     Motor: Weakness present.     Comments: Patient cannot hear. Have to communicate with writing on paper.  Subjective  sensation deficit on the right upper extremity.  Patient is able to detect touch.  Right upper extremity weakness present although able to hold it against gravity.  Psychiatric:        Mood and Affect: Mood normal.     ED Results / Procedures / Treatments   Labs (all labs ordered are listed, but only abnormal results are displayed) Labs Reviewed  CBC - Abnormal; Notable for the following components:      Result Value   Hemoglobin 10.0 (*)    HCT 34.9 (*)    MCH 23.7 (*)    MCHC 28.7 (*)    RDW 16.6 (*)    All other components within normal limits  COMPREHENSIVE METABOLIC PANEL - Abnormal; Notable for the following components:   Calcium 8.5 (*)    Albumin 3.1 (*)    All other components within normal limits  I-STAT CHEM 8, ED - Abnormal; Notable for the following components:   Calcium, Ion 1.08 (*)    Hemoglobin 11.6 (*)    HCT 34.0 (*)    All other components within normal limits  PROTIME-INR  APTT  DIFFERENTIAL  ETHANOL  RAPID URINE DRUG SCREEN, HOSP PERFORMED  URINALYSIS, ROUTINE W REFLEX MICROSCOPIC  HCG, SERUM, QUALITATIVE  CBG MONITORING, ED  I-STAT CHEM 8, ED    EKG None  Radiology CT HEAD CODE STROKE WO CONTRAST`  Result Date: 11/01/2023 CLINICAL DATA:  Left-sided weakness, stroke suspected EXAM: CT HEAD WITHOUT CONTRAST CT ANGIOGRAPHY OF THE HEAD AND NECK TECHNIQUE: Contiguous axial images were obtained from the base of the skull through the vertex without intravenous contrast. Multidetector CT imaging of the head and neck was performed using the standard protocol during bolus administration of intravenous contrast. Multiplanar CT image reconstructions and MIPs were obtained to evaluate the vascular anatomy. Carotid stenosis measurements (when applicable) are obtained utilizing NASCET criteria, using the distal internal carotid diameter as the denominator. RADIATION DOSE REDUCTION: This exam was performed according to the departmental dose-optimization program  which includes automated exposure control, adjustment of the mA and/or kV according to patient size and/or use of iterative reconstruction technique. CONTRAST:  75mL OMNIPAQUE IOHEXOL 350 MG/ML SOLN COMPARISON:  04/05/2021 CT head, 04/02/2021 CTA head and neck FINDINGS: CT HEAD Brain: No evidence of acute infarction, hemorrhage, mass, mass effect, or midline shift. No hydrocephalus or extra-axial collection. Extensive encephalomalacia in the right MCA territory is consistent with evolution the acute infarct seen on 04/05/2021. Additional more remote encephalomalacia in left parietal and temporal lobe appears unchanged. Vascular: No hyperdense vessel. Skull: Negative for fracture or focal lesion. Sinuses/Orbits: No acute finding. Other: The mastoid air cells are well aerated. CTA NECK FINDINGS Aortic arch: Incompletely imaged. Common origin of the brachiocephalic and left common carotid arteries. No significant stenosis of the imaged major arch vessel origins. Right carotid system: No evidence of stenosis, dissection, or occlusion. Left carotid system: No evidence of stenosis, dissection, or occlusion. Vertebral arteries: No evidence of stenosis, dissection, or occlusion. Skeleton: No acute osseous abnormality. Degenerative changes in the cervical spine. Other neck: No acute finding. Upper chest: No focal pulmonary opacity or pleural effusion. Review of the MIP images confirms the above findings CTA HEAD FINDINGS Anterior circulation: Both internal carotid arteries are patent to the termini, without significant stenosis. A1 segments patent. Normal anterior communicating artery. Anterior cerebral arteries are patent to their distal aspects without significant stenosis. Moderate stenosis in the right M1 (series 7, image 104), which is otherwise patent. Right MCA branches are patent to their distal aspects without significant stenosis. No stenosis in left M1 or left MCA branches. Posterior circulation: The right V4  primarily supplies the right PICA. Vertebral arteries patent to the vertebrobasilar junction without significant stenosis. Posterior inferior cerebellar arteries patent proximally. Basilar patent to its distal aspect without significant stenosis. Superior cerebellar arteries patent proximally. Patent P1 segments. Near fetal origin of the bilateral PCAs from the posterior communicating arteries. PCAs perfused to their distal aspects without significant stenosis. The bilateral posterior communicating arteries are not visualized. Venous sinuses: As permitted by contrast timing, patent. Anatomic variants: Near fetal origin of the bilateral posterior communicating arteries. No evidence of aneurysm or vascular malformation. Review of the MIP images  confirms the above findings IMPRESSION: 1. No acute intracranial process. Extensive encephalomalacia in the right MCA territory is consistent with evolution of the acute infarct seen on 04/05/2021. 2. No intracranial large vessel occlusion. Moderate stenosis in the right M1. 3. No hemodynamically significant stenosis in the neck. Imaging results from the noncontrast CT were communicated on 11/01/2023 at 7:11 pm to provider Dr. Derry Lory via secure text paging. Imaging results from the CTA were communicated on 11/01/2023 at 7:18 pm to provider Dr. Derry Lory via secure text paging. Electronically Signed   By: Wiliam Ke M.D.   On: 11/01/2023 19:18   CT ANGIO HEAD NECK W WO CM (CODE STROKE)  Result Date: 11/01/2023 CLINICAL DATA:  Left-sided weakness, stroke suspected EXAM: CT HEAD WITHOUT CONTRAST CT ANGIOGRAPHY OF THE HEAD AND NECK TECHNIQUE: Contiguous axial images were obtained from the base of the skull through the vertex without intravenous contrast. Multidetector CT imaging of the head and neck was performed using the standard protocol during bolus administration of intravenous contrast. Multiplanar CT image reconstructions and MIPs were obtained to evaluate the  vascular anatomy. Carotid stenosis measurements (when applicable) are obtained utilizing NASCET criteria, using the distal internal carotid diameter as the denominator. RADIATION DOSE REDUCTION: This exam was performed according to the departmental dose-optimization program which includes automated exposure control, adjustment of the mA and/or kV according to patient size and/or use of iterative reconstruction technique. CONTRAST:  75mL OMNIPAQUE IOHEXOL 350 MG/ML SOLN COMPARISON:  04/05/2021 CT head, 04/02/2021 CTA head and neck FINDINGS: CT HEAD Brain: No evidence of acute infarction, hemorrhage, mass, mass effect, or midline shift. No hydrocephalus or extra-axial collection. Extensive encephalomalacia in the right MCA territory is consistent with evolution the acute infarct seen on 04/05/2021. Additional more remote encephalomalacia in left parietal and temporal lobe appears unchanged. Vascular: No hyperdense vessel. Skull: Negative for fracture or focal lesion. Sinuses/Orbits: No acute finding. Other: The mastoid air cells are well aerated. CTA NECK FINDINGS Aortic arch: Incompletely imaged. Common origin of the brachiocephalic and left common carotid arteries. No significant stenosis of the imaged major arch vessel origins. Right carotid system: No evidence of stenosis, dissection, or occlusion. Left carotid system: No evidence of stenosis, dissection, or occlusion. Vertebral arteries: No evidence of stenosis, dissection, or occlusion. Skeleton: No acute osseous abnormality. Degenerative changes in the cervical spine. Other neck: No acute finding. Upper chest: No focal pulmonary opacity or pleural effusion. Review of the MIP images confirms the above findings CTA HEAD FINDINGS Anterior circulation: Both internal carotid arteries are patent to the termini, without significant stenosis. A1 segments patent. Normal anterior communicating artery. Anterior cerebral arteries are patent to their distal aspects without  significant stenosis. Moderate stenosis in the right M1 (series 7, image 104), which is otherwise patent. Right MCA branches are patent to their distal aspects without significant stenosis. No stenosis in left M1 or left MCA branches. Posterior circulation: The right V4 primarily supplies the right PICA. Vertebral arteries patent to the vertebrobasilar junction without significant stenosis. Posterior inferior cerebellar arteries patent proximally. Basilar patent to its distal aspect without significant stenosis. Superior cerebellar arteries patent proximally. Patent P1 segments. Near fetal origin of the bilateral PCAs from the posterior communicating arteries. PCAs perfused to their distal aspects without significant stenosis. The bilateral posterior communicating arteries are not visualized. Venous sinuses: As permitted by contrast timing, patent. Anatomic variants: Near fetal origin of the bilateral posterior communicating arteries. No evidence of aneurysm or vascular malformation. Review of the MIP images confirms the  above findings IMPRESSION: 1. No acute intracranial process. Extensive encephalomalacia in the right MCA territory is consistent with evolution of the acute infarct seen on 04/05/2021. 2. No intracranial large vessel occlusion. Moderate stenosis in the right M1. 3. No hemodynamically significant stenosis in the neck. Imaging results from the noncontrast CT were communicated on 11/01/2023 at 7:11 pm to provider Dr. Derry Lory via secure text paging. Imaging results from the CTA were communicated on 11/01/2023 at 7:18 pm to provider Dr. Derry Lory via secure text paging. Electronically Signed   By: Wiliam Ke M.D.   On: 11/01/2023 19:18    Procedures .Critical Care  Performed by: Franne Forts, DO Authorized by: Franne Forts, DO   Critical care provider statement:    Critical care time (minutes):  78   Critical care was necessary to treat or prevent imminent or life-threatening  deterioration of the following conditions: stroke like symtpoms.   Critical care was time spent personally by me on the following activities:  Development of treatment plan with patient or surrogate, discussions with consultants, evaluation of patient's response to treatment, examination of patient, ordering and review of laboratory studies, ordering and review of radiographic studies, ordering and performing treatments and interventions, pulse oximetry, re-evaluation of patient's condition and review of old charts   Care discussed with comment:  Neurology     Medications Ordered in ED Medications  iohexol (OMNIPAQUE) 350 MG/ML injection 75 mL (75 mLs Intravenous Contrast Given 11/01/23 1905)    ED Course/ Medical Decision Making/ A&P                                 Medical Decision Making Amount and/or Complexity of Data Reviewed Labs: ordered.   69:23 PM  37 year old female with past medical history of cryptogenic strokes and difficulty of hearing presenting for complaints of right upper extremity weakness and numbness.  Stroke activated.  Neurology at the bedside.  Patient is not a tPA candidate due to being on blood thinner Eliquis.  CT head demonstrates: 1. No acute intracranial process. Extensive encephalomalacia in the right MCA territory is consistent with evolution of the acute infarct seen on 04/05/2021. 2. No intracranial large vessel occlusion. Moderate stenosis in the right M1. 3. No hemodynamically significant stenosis in the neck  CTA demonstrates: 1. No acute intracranial process. Extensive encephalomalacia in the right MCA territory is consistent with evolution of the acute infarct seen on 04/05/2021. 2. No intracranial large vessel occlusion. Moderate stenosis in the right M1. 3. No hemodynamically significant stenosis in the neck.  MRI ordered and pending.  Patient signed out to oncoming provider while awaiting MRI results.        Final Clinical  Impression(s) / ED Diagnoses Final diagnoses:  Right sided weakness    Rx / DC Orders ED Discharge Orders     None         Franne Forts, DO 11/01/23 2221

## 2023-11-02 ENCOUNTER — Inpatient Hospital Stay (HOSPITAL_COMMUNITY): Payer: Medicare Other

## 2023-11-02 DIAGNOSIS — R202 Paresthesia of skin: Secondary | ICD-10-CM

## 2023-11-02 DIAGNOSIS — G459 Transient cerebral ischemic attack, unspecified: Secondary | ICD-10-CM | POA: Diagnosis not present

## 2023-11-02 DIAGNOSIS — R569 Unspecified convulsions: Secondary | ICD-10-CM

## 2023-11-02 DIAGNOSIS — I10 Essential (primary) hypertension: Secondary | ICD-10-CM | POA: Diagnosis present

## 2023-11-02 DIAGNOSIS — Z833 Family history of diabetes mellitus: Secondary | ICD-10-CM | POA: Diagnosis not present

## 2023-11-02 DIAGNOSIS — Z931 Gastrostomy status: Secondary | ICD-10-CM | POA: Diagnosis not present

## 2023-11-02 DIAGNOSIS — Z9049 Acquired absence of other specified parts of digestive tract: Secondary | ICD-10-CM | POA: Diagnosis not present

## 2023-11-02 DIAGNOSIS — F419 Anxiety disorder, unspecified: Secondary | ICD-10-CM | POA: Diagnosis present

## 2023-11-02 DIAGNOSIS — R488 Other symbolic dysfunctions: Secondary | ICD-10-CM | POA: Diagnosis present

## 2023-11-02 DIAGNOSIS — G43909 Migraine, unspecified, not intractable, without status migrainosus: Secondary | ICD-10-CM | POA: Diagnosis present

## 2023-11-02 DIAGNOSIS — Z6841 Body Mass Index (BMI) 40.0 and over, adult: Secondary | ICD-10-CM | POA: Diagnosis not present

## 2023-11-02 DIAGNOSIS — I69354 Hemiplegia and hemiparesis following cerebral infarction affecting left non-dominant side: Secondary | ICD-10-CM | POA: Diagnosis not present

## 2023-11-02 DIAGNOSIS — Z87891 Personal history of nicotine dependence: Secondary | ICD-10-CM | POA: Diagnosis not present

## 2023-11-02 DIAGNOSIS — H9190 Unspecified hearing loss, unspecified ear: Secondary | ICD-10-CM | POA: Diagnosis present

## 2023-11-02 DIAGNOSIS — R1310 Dysphagia, unspecified: Secondary | ICD-10-CM | POA: Diagnosis present

## 2023-11-02 DIAGNOSIS — G9389 Other specified disorders of brain: Secondary | ICD-10-CM | POA: Diagnosis present

## 2023-11-02 DIAGNOSIS — E669 Obesity, unspecified: Secondary | ICD-10-CM | POA: Diagnosis present

## 2023-11-02 DIAGNOSIS — I6340 Cerebral infarction due to embolism of unspecified cerebral artery: Secondary | ICD-10-CM | POA: Diagnosis not present

## 2023-11-02 DIAGNOSIS — I69851 Hemiplegia and hemiparesis following other cerebrovascular disease affecting right dominant side: Secondary | ICD-10-CM | POA: Diagnosis not present

## 2023-11-02 DIAGNOSIS — I69398 Other sequelae of cerebral infarction: Secondary | ICD-10-CM | POA: Diagnosis not present

## 2023-11-02 DIAGNOSIS — R531 Weakness: Secondary | ICD-10-CM | POA: Diagnosis present

## 2023-11-02 DIAGNOSIS — Z7901 Long term (current) use of anticoagulants: Secondary | ICD-10-CM | POA: Diagnosis not present

## 2023-11-02 DIAGNOSIS — Q2112 Patent foramen ovale: Secondary | ICD-10-CM | POA: Diagnosis not present

## 2023-11-02 DIAGNOSIS — Z8249 Family history of ischemic heart disease and other diseases of the circulatory system: Secondary | ICD-10-CM | POA: Diagnosis not present

## 2023-11-02 DIAGNOSIS — G8929 Other chronic pain: Secondary | ICD-10-CM | POA: Diagnosis present

## 2023-11-02 DIAGNOSIS — E785 Hyperlipidemia, unspecified: Secondary | ICD-10-CM | POA: Diagnosis present

## 2023-11-02 DIAGNOSIS — F32A Depression, unspecified: Secondary | ICD-10-CM | POA: Diagnosis present

## 2023-11-02 DIAGNOSIS — I69322 Dysarthria following cerebral infarction: Secondary | ICD-10-CM | POA: Diagnosis not present

## 2023-11-02 DIAGNOSIS — Z79899 Other long term (current) drug therapy: Secondary | ICD-10-CM | POA: Diagnosis not present

## 2023-11-02 LAB — ECHOCARDIOGRAM COMPLETE
AR max vel: 1.93 cm2
AV Area VTI: 2.06 cm2
AV Area mean vel: 1.88 cm2
AV Mean grad: 4 mm[Hg]
AV Peak grad: 6.7 mm[Hg]
Ao pk vel: 1.29 m/s
Area-P 1/2: 5.13 cm2
Height: 65 in
S' Lateral: 2.7 cm
Weight: 4373.93 [oz_av]

## 2023-11-02 LAB — COMPREHENSIVE METABOLIC PANEL
ALT: 13 U/L (ref 0–44)
AST: 19 U/L (ref 15–41)
Albumin: 3 g/dL — ABNORMAL LOW (ref 3.5–5.0)
Alkaline Phosphatase: 73 U/L (ref 38–126)
Anion gap: 7 (ref 5–15)
BUN: 11 mg/dL (ref 6–20)
CO2: 22 mmol/L (ref 22–32)
Calcium: 8.4 mg/dL — ABNORMAL LOW (ref 8.9–10.3)
Chloride: 111 mmol/L (ref 98–111)
Creatinine, Ser: 0.75 mg/dL (ref 0.44–1.00)
GFR, Estimated: >60 mL/min (ref >60)
Glucose, Bld: 83 mg/dL (ref 70–99)
Potassium: 3.7 mmol/L (ref 3.5–5.1)
Sodium: 140 mmol/L (ref 135–145)
Total Bilirubin: 0.7 mg/dL (ref <1.2)
Total Protein: 6.8 g/dL (ref 6.5–8.1)

## 2023-11-02 LAB — CBC
HCT: 31.7 % — ABNORMAL LOW (ref 36.0–46.0)
Hemoglobin: 9.5 g/dL — ABNORMAL LOW (ref 12.0–15.0)
MCH: 24.2 pg — ABNORMAL LOW (ref 26.0–34.0)
MCHC: 30 g/dL (ref 30.0–36.0)
MCV: 80.7 fL (ref 80.0–100.0)
Platelets: 220 10*3/uL (ref 150–400)
RBC: 3.93 MIL/uL (ref 3.87–5.11)
RDW: 16.5 % — ABNORMAL HIGH (ref 11.5–15.5)
WBC: 8.6 10*3/uL (ref 4.0–10.5)
nRBC: 0 % (ref 0.0–0.2)

## 2023-11-02 LAB — URINALYSIS, ROUTINE W REFLEX MICROSCOPIC
Bacteria, UA: NONE SEEN
Bilirubin Urine: NEGATIVE
Glucose, UA: NEGATIVE mg/dL
Ketones, ur: NEGATIVE mg/dL
Leukocytes,Ua: NEGATIVE
Nitrite: NEGATIVE
Protein, ur: NEGATIVE mg/dL
Specific Gravity, Urine: >1.046 — ABNORMAL HIGH (ref 1.005–1.030)
pH: 7 (ref 5.0–8.0)

## 2023-11-02 LAB — RAPID URINE DRUG SCREEN, HOSP PERFORMED
Amphetamines: NOT DETECTED
Barbiturates: NOT DETECTED
Benzodiazepines: NOT DETECTED
Cocaine: NOT DETECTED
Opiates: NOT DETECTED
Tetrahydrocannabinol: NOT DETECTED

## 2023-11-02 LAB — HEMOGLOBIN A1C
Hgb A1c MFr Bld: 5 % (ref 4.8–5.6)
Mean Plasma Glucose: 96.8 mg/dL

## 2023-11-02 LAB — LIPID PANEL
Cholesterol: 101 mg/dL (ref 0–200)
HDL: 37 mg/dL — ABNORMAL LOW (ref >40)
LDL Cholesterol: 37 mg/dL (ref 0–99)
Total CHOL/HDL Ratio: 2.7 {ratio}
Triglycerides: 136 mg/dL (ref <150)
VLDL: 27 mg/dL (ref 0–40)

## 2023-11-02 LAB — HCG, SERUM, QUALITATIVE: Preg, Serum: NEGATIVE

## 2023-11-02 MED ORDER — STROKE: EARLY STAGES OF RECOVERY BOOK
Freq: Once | Status: AC
Start: 2023-11-03 — End: 2023-11-03
  Filled 2023-11-02: qty 1

## 2023-11-02 MED ORDER — ACETAMINOPHEN 160 MG/5ML PO SOLN: 650.0000 mg | ORAL | Status: DC | PRN

## 2023-11-02 MED ORDER — HEPARIN SODIUM (PORCINE) 5000 UNIT/ML IJ SOLN: 5000.0000 [IU] | Freq: Three times a day (TID) | INTRAMUSCULAR | Status: DC

## 2023-11-02 MED ORDER — ACETAMINOPHEN 325 MG PO TABS
650.0000 mg | ORAL_TABLET | ORAL | Status: DC | PRN
  Administered 2023-11-04: 650 mg via ORAL
  Filled 2023-11-02: qty 2

## 2023-11-02 MED ORDER — METOPROLOL TARTRATE 5 MG/5ML IV SOLN: 5.0000 mg | INTRAVENOUS | Status: DC | PRN

## 2023-11-02 MED ORDER — EZETIMIBE 10 MG PO TABS
10.0000 mg | ORAL_TABLET | Freq: Every day | ORAL | Status: DC
  Administered 2023-11-02 – 2023-11-04 (×3): 10 mg via ORAL
  Filled 2023-11-02 (×3): qty 1

## 2023-11-02 MED ORDER — SODIUM CHLORIDE 0.9 % IV SOLN: INTRAVENOUS | Status: AC

## 2023-11-02 MED ORDER — SENNOSIDES-DOCUSATE SODIUM 8.6-50 MG PO TABS: 1.0000 | ORAL_TABLET | Freq: Every evening | ORAL | Status: DC | PRN

## 2023-11-02 MED ORDER — ACETAMINOPHEN 650 MG RE SUPP: 650.0000 mg | RECTAL | Status: DC | PRN

## 2023-11-02 MED ORDER — APIXABAN 5 MG PO TABS
5.0000 mg | ORAL_TABLET | Freq: Two times a day (BID) | ORAL | Status: DC
  Administered 2023-11-02 – 2023-11-04 (×6): 5 mg via ORAL
  Filled 2023-11-02 (×7): qty 1

## 2023-11-02 MED ORDER — ATORVASTATIN CALCIUM 40 MG PO TABS
40.0000 mg | ORAL_TABLET | Freq: Every day | ORAL | Status: DC
  Administered 2023-11-02 – 2023-11-04 (×3): 40 mg via ORAL
  Filled 2023-11-02 (×4): qty 1

## 2023-11-02 MED ORDER — TOPIRAMATE 25 MG PO TABS
150.0000 mg | ORAL_TABLET | Freq: Two times a day (BID) | ORAL | Status: DC
  Administered 2023-11-02 – 2023-11-04 (×6): 150 mg via ORAL
  Filled 2023-11-02 (×3): qty 2
  Filled 2023-11-02: qty 6
  Filled 2023-11-02 (×3): qty 2

## 2023-11-02 NOTE — Evaluation (Signed)
Physical Therapy Evaluation Patient Details Name: Krista Maddox MRN: [id number] DOB: 28-Mar-1986 Today's Date: 11/02/2023  History of Present Illness  37 y.o. female presents to [facility] hospital on 11/01/2023 with R weakness and numbness. 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 MRI is negative for acute processes.  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  Clinical Impression  Pt presents to PT at or near her functional baseline based on information documented in chart. Per OT evaluation, the pt has no been out of bed in multiple months since returning home from SNF. OT note also indicates PT services were stopped in October due to lack of progress. Pt RUE and RLE demonstrate functional ROM and strength on this evaluation. Pt requires significant assistance to perform bed mobility, however this appears to be baseline. If pt's family are unable to provide sufficient care for the patient at this time then long term care without PT services is recommended, however the pt does not demonstrate the need for post-acute PT services at this time. Acute PT signing off.        If plan is discharge home, recommend the following: Two people to help with walking and/or transfers;Two people to help with bathing/dressing/bathroom;Assist for transportation;Direct supervision/assist for financial management;Direct supervision/assist for medications management;Assistance with feeding;Assistance with cooking/housework   Can travel by private vehicle   No    Equipment Recommendations None recommended by PT  Recommendations for Other Services       Functional Status Assessment Patient has not had a recent decline in their functional status     Precautions / Restrictions Precautions Precautions: Fall Restrictions Weight Bearing Restrictions: No      Mobility  Bed Mobility Overal bed  mobility: Needs Assistance Bed Mobility: Supine to Sit, Sit to Supine     Supine to sit: Max assist, HOB elevated Sit to supine: Total assist        Transfers Overall transfer level:  (pt does not transfer out of bed at home)                      Ambulation/Gait                  Stairs            Wheelchair Mobility     Tilt Bed    Modified Rankin (Stroke Patients Only)       Balance Overall balance assessment: Needs assistance Sitting-balance support: No upper extremity supported, Feet unsupported Sitting balance-Leahy Scale: Poor Sitting balance - Comments: maxA, posterior lean Postural control: Posterior lean                                   Pertinent Vitals/Pain Pain Assessment Pain Assessment: PAINAD (utilized due to lack of faces scale and impaired expressive communication) Breathing: normal Negative Vocalization: none Facial Expression: smiling or inexpressive Body Language: relaxed Consolability: no need to console PAINAD Score: 0 Pain Intervention(s): Monitored during session    Home Living Family/patient expects to be discharged to:: Private residence Living Arrangements: Parent;Children;Other (Comment) (all family are diabled, aide 5x/week for 15 minutes to one hour) Available Help at Discharge: Available 24 hours/day;Personal care attendant Type of Home: House Home Access: Ramped entrance       Home Layout: One level Home Equipment: Hospital bed;Wheelchair -  manual;Other (comment) (hemi-walker, hoyer lift) Additional Comments: Pt does not use hoyer bc she never gets OOB    Prior Function Prior Level of Function : Needs assist  Cognitive Assist : Mobility (cognitive);ADLs (cognitive) Mobility (Cognitive): Step by step cues   Physical Assist : Mobility (physical);ADLs (physical) Mobility (physical): Bed mobility;Transfers ADLs (physical): Bathing;Dressing;Toileting;IADLs;Grooming Mobility Comments: has  not been out of bed in last few months. PT d/c d pt in Oct due to no progress ADLs Comments: Pt only feeds self. Aid comes 2x day to bathe pt and change diapers.     Extremity/Trunk Assessment   Upper Extremity Assessment Upper Extremity Assessment: LUE deficits/detail;RUE deficits/detail RUE Deficits / Details: AROM WFL, strength grossly 4/5 RUE Sensation:  (difficult to assess due to impaired communication) RUE Coordination: decreased fine motor LUE Deficits / Details: LUE in decorticate positioning upon PT arrival. Pt is able to flex shoulder actively ~20 degrees, PROM at shoulder and elbow is [facility] with increased time due to tone. Wrist flexion contracture of ~5 degrees, digits contracted into flexion as well LUE: Unable to fully assess due to pain;Shoulder pain with ROM LUE Sensation: decreased light touch LUE Coordination: decreased fine motor;decreased gross motor    Lower Extremity Assessment Lower Extremity Assessment: RLE deficits/detail;LLE deficits/detail RLE Deficits / Details: ROM WFL, grossly 4/5 LLE Deficits / Details: pt with ankle PF contracture and knee extension contracture. PT is unable to break knee extension more than ~10 degrees due to extensor tone.    Cervical / Trunk Assessment Cervical / Trunk Assessment: Other exceptions Cervical / Trunk Exceptions: body habitus  Communication   Communication Communication: Difficulty following commands/understanding;Difficulty communicating thoughts/reduced clarity of speech Following commands: Follows one step commands inconsistently;Follows one step commands with increased time Cueing Techniques: Verbal cues;Gestural cues;Visual cues;Tactile cues  Cognition Arousal: Alert Behavior During Therapy: Flat affect (flat for most of session, does smile at the end of session when saying thank you) Overall Cognitive Status: History of cognitive impairments - at baseline                                 General  Comments: expressive deficits since R MCA CVA in 2022. pt inconsistently follows motor commands, appears to do better with visual cueing combined with verbal.        General Comments General comments (skin integrity, edema, etc.): VSS on RA    Exercises     Assessment/Plan    PT Assessment Patient does not need any further PT services (likely at or near baseline)  PT Problem List         PT Treatment Interventions      PT Goals (Current goals can be found in the Care Plan section)       Frequency       Co-evaluation               AM-PAC PT "6 Clicks" Mobility  Outcome Measure Help needed turning from your back to your side while in a flat bed without using bedrails?: A Lot Help needed moving from lying on your back to sitting on the side of a flat bed without using bedrails?: A Lot Help needed moving to and from a bed to a chair (including a wheelchair)?: Total Help needed standing up from a chair using your arms (e.g., wheelchair or bedside chair)?: Total Help needed to walk in hospital room?: Total Help needed climbing 3-5 steps with a railing? :  Total 6 Click Score: 8    End of Session   Activity Tolerance: Patient tolerated treatment well Patient left: in bed;with call bell/phone within reach Nurse Communication: Mobility status;Need for lift equipment PT Visit Diagnosis: Other symptoms and signs involving the nervous system (R29.898)    Time: 1410-1437 PT Time Calculation (min) (ACUTE ONLY): 27 min   Charges:   PT Evaluation $PT Eval Low Complexity: 1 Low   PT General Charges $$ ACUTE PT VISIT: 1 Visit         Arlyss Gandy, PT, DPT Acute Rehabilitation Office [phone]   Arlyss Gandy 11/02/2023, 3:00 PM

## 2023-11-02 NOTE — Evaluation (Signed)
Occupational Therapy Evaluation Patient Details Name: Krista Maddox MRN: [id number] DOB: 1986/01/15 Today's Date: 11/02/2023   History of Present Illness Pt is a 37 yo femaled admitted with new R side weakness. CT scans -.  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   Clinical Impression   Pt admitted with the above diagnosis and has the deficits outlined below. Pt, at baseline, is dependent for all mobility and all adls outside of feeding. Pt was in SNF until last year when family took her out bc she was not being treated well, per mother. Pt is in bed at all times and has an aid that comes 5x week 2x a day to assist with toileting needs and bathing.  Mother, father and pts daughter are all disabled. Mother stated they would like her to live in SNF closer to home (last one was 2 hours away) but cannot find one.  Feel this pt is most likely currently too much for this family to handle but they have had no other options. Mother did say they are having difficulty affording diapers and supplies for pt.  Pt will benefit from trial of OT to address her feeding needs since pt was independent feeding and now has R side weakness.  Pt's r side appears to be improving so hoping pt can participate in grooming and feeding.  Will continue to see for this goal.         If plan is discharge home, recommend the following: Two people to help with bathing/dressing/bathroom;Assistance with feeding;Assistance with cooking/housework;Direct supervision/assist for medications management;Direct supervision/assist for financial management;Assist for transportation;Help with stairs or ramp for entrance;Supervision due to cognitive status    Functional Status Assessment  Patient has had a recent decline in their functional status and demonstrates the ability to make significant improvements in function in a reasonable and predictable  amount of time.  Equipment Recommendations  None recommended by OT    Recommendations for Other Services       Precautions / Restrictions Precautions Precautions: Fall Restrictions Weight Bearing Restrictions: No      Mobility Bed Mobility Overal bed mobility: Needs Assistance Bed Mobility: Rolling Rolling: Max assist         General bed mobility comments: Pt required max assist to roll. Did not come to sitting position on gurney due to safety concerns without +2 assist. Pt does not sit on EOB at home.  Always in bed.    Transfers                   General transfer comment: declined transfers. Has not stood in months      Balance                                           ADL either performed or assessed with clinical judgement   ADL Overall ADL's : Needs assistance/impaired Eating/Feeding: Minimal assistance;Sitting   Grooming: Wash/dry hands;Wash/dry face;Oral care;Minimal assistance;Sitting;Bed level   Upper Body Bathing: Total assistance   Lower Body Bathing: Total assistance   Upper Body Dressing : Total assistance   Lower Body Dressing: Total assistance       Toileting- Clothing Manipulation and Hygiene: Total assistance     Tub/Shower Transfer Details (indicate cue type and reason): bed bathes onl.y Functional mobility during ADLs: Maximal  assistance General ADL Comments: dependent PTA outside of feeding and some grooming     Vision Baseline Vision/History:  (optic nerve disorder and difficult to assess.) Ability to See in Adequate Light: 0 Adequate Patient Visual Report: No change from baseline Vision Assessment?: Yes Eye Alignment: Impaired (comment) Ocular Range of Motion: Within Functional Limits Alignment/Gaze Preference: Within Defined Limits Tracking/Visual Pursuits: Able to track stimulus in all quads without difficulty Additional Comments: very difficult to assess due to communication difficulties      Perception         Praxis         Pertinent Vitals/Pain Pain Assessment Pain Assessment: Faces Faces Pain Scale: Hurts little more Pain Location: L shoulder (chronic) Pain Descriptors / Indicators: Sore Pain Intervention(s): Limited activity within patient's tolerance, Monitored during session, Repositioned     Extremity/Trunk Assessment Upper Extremity Assessment Upper Extremity Assessment: RUE deficits/detail;LUE deficits/detail;Right hand dominant RUE Deficits / Details: AROM WFL. Strength:  4/5 throughout RUE Sensation: decreased light touch RUE Coordination: decreased fine motor LUE Deficits / Details: Pt LUE is conracted and not functional. Has been this way since CVA 4/22. LUE: Unable to fully assess due to pain;Shoulder pain with ROM LUE Sensation: decreased light touch LUE Coordination: decreased fine motor;decreased gross motor           Communication Communication Communication: Difficulty communicating thoughts/reduced clarity of speech;Difficulty following commands/understanding;Other (comment) (Pt followed some written commands but not all) Cueing Techniques: Visual cues   Cognition Arousal: Alert Behavior During Therapy: Flat affect Overall Cognitive Status: History of cognitive impairments - at baseline                                 General Comments: Pt's mother feels pt is most likely at baseline cognitively but very hard to evaluate due to inability to commuicate and yes/no's seem innaccurate.     General Comments  Pt most likely very close to baseline functional level outside of feeding self.    Exercises     Shoulder Instructions      Home Living Family/patient expects to be discharged to:: Private residence Living Arrangements: Parent;Children;Other (Comment) (all disabled. Has Aid 5x week mornnig and night for 15 min to one hour) Available Help at Discharge: Available 24 hours/day;Personal care attendant Type of Home:  House Home Access: Ramped entrance     Home Layout: One level     Bathroom Shower/Tub: Sponge bathes at baseline     Bathroom Accessibility:  (uses diapers only)   Home Equipment: Hospital bed;Wheelchair - manual;Other (comment) (hemi walker, hoyer lift)   Additional Comments: Pt does not use hoyer bc she never gets OOB      Prior Functioning/Environment Prior Level of Function : Needs assist  Cognitive Assist : Mobility (cognitive);ADLs (cognitive) Mobility (Cognitive): Step by step cues   Physical Assist : Mobility (physical);ADLs (physical) Mobility (physical): Bed mobility;Transfers ADLs (physical): Bathing;Dressing;Toileting;IADLs;Grooming Mobility Comments: has not been out of bed in last few months. PT d/c d pt in Oct due to no progress ADLs Comments: Pt only feeds self. Aid comes 2x day to bathe pt and change diapers.        OT Problem List: Decreased strength;Decreased range of motion;Decreased activity tolerance;Impaired balance (sitting and/or standing);Impaired vision/perception;Decreased coordination;Decreased cognition;Decreased safety awareness;Decreased knowledge of use of DME or AE;Decreased knowledge of precautions;Impaired sensation;Impaired tone;Obesity;Impaired UE functional use;Pain      OT Treatment/Interventions: Self-care/ADL training    OT  Goals(Current goals can be found in the care plan section) Acute Rehab OT Goals Patient Stated Goal: none stated OT Goal Formulation: With family Time For Goal Achievement: 11/15/23 Potential to Achieve Goals: Fair ADL Goals Pt Will Perform Eating: with set-up;sitting Pt Will Perform Grooming: with set-up;sitting;bed level Additional ADL Goal #1: Pt will sit EOB for 2 minutes with mod assist to evaluate if EOB is an option for increased mobility in home with parents.  OT Frequency: Min 1X/week    Co-evaluation              AM-PAC OT "6 Clicks" Daily Activity     Outcome Measure Help from another  person eating meals?: A Little Help from another person taking care of personal grooming?: A Little Help from another person toileting, which includes using toliet, bedpan, or urinal?: Total Help from another person bathing (including washing, rinsing, drying)?: Total Help from another person to put on and taking off regular upper body clothing?: Total Help from another person to put on and taking off regular lower body clothing?: Total 6 Click Score: 10   End of Session Nurse Communication: Mobility status  Activity Tolerance: Patient tolerated treatment well Patient left: in bed;with call bell/phone within reach  OT Visit Diagnosis: Pain;Muscle weakness (generalized) (M62.81) Pain - Right/Left: Left Pain - part of body: Shoulder                Time: 1200-1220 OT Time Calculation (min): 20 min Charges:  OT General Charges $OT Visit: 1 Visit OT Evaluation $OT Eval Moderate Complexity: 1 Mod  Hope Budds 11/02/2023, 12:38 PM

## 2023-11-02 NOTE — Procedures (Signed)
Patient Name: Krista Maddox  MRN: [id number]  Epilepsy Attending: Charlsie Quest  Referring Physician/Provider: Rejeana Brock, MD  Date: 11/02/2023 Duration: 27.22 mins  Patient history: 37 yo F with acute onset of right hemiparesis, decreased sensation to right side. EEG to evaluate for seizure  Level of alertness: Awake  AEDs during EEG study: TPM  Technical aspects: This EEG study was done with scalp electrodes positioned according to the 10-20 International system of electrode placement. Electrical activity was reviewed with band pass filter of 1-70Hz , sensitivity of 7 uV/mm, display speed of 36mm/sec with a 60Hz  notched filter applied as appropriate. EEG data were recorded continuously and digitally stored.  Video monitoring was available and reviewed as appropriate.  Description: The posterior dominant rhythm consists of 8 Hz activity of moderate voltage (25-35 uV) seen predominantly in posterior head regions, symmetric and reactive to eye opening and eye closing. EEG showed continuous 3 to 6 Hz theta-delta slowing in right fronto-temporo-parietal region. Hyperventilation and photic stimulation were not performed.     ABNORMALITY - Continuous slow, right fronto-temporo-parietal region  IMPRESSION: This study is suggestive of cortical dysfunction arising from right fronto-temporo-parietal region likely secondary to underlying stroke.  No seizures or epileptiform discharges were seen throughout the recording.  [name]

## 2023-11-02 NOTE — Progress Notes (Signed)
EEG complete - results pending 

## 2023-11-02 NOTE — ED Notes (Signed)
ED TO INPATIENT HANDOFF REPORT  ED Nurse Name and Phone #: Waynesha Rammel [phone]  S Name/Age/Gender Salomon Fick 37 y.o. female Room/Bed: 039C/039C  Code Status   Code Status: Full Code  Home/SNF/Other Home Patient oriented to: self Is this baseline? Yes   Triage Complete: Triage complete  Chief Complaint Acute left-sided weakness [R53.1]  Triage Note Patient brought in from home as a code stroke.  Patient has history of strokes and has left sided deficits, and is unable to speak at baseline.   Patient can nod yes or no appropriately to questions.    Per EMS reports LKW 1645, patient started with headache and right arm numbness and weakness, and report R hand contractures.    Patient is on eliqius.    Allergies Allergies  Allergen Reactions   Tape Rash    Cannot tolerate paper tape    Level of Care/Admitting Diagnosis ED Disposition     ED Disposition  Admit   Condition  --   Comment  Hospital Area: [facility] [[id number]]  Level of Care: Telemetry Medical [104]  May admit patient to [facility] or [facility] if equivalent level of care is available:: No  Covid Evaluation: Asymptomatic - no recent exposure (last 10 days) testing not required  Diagnosis: Acute left-sided weakness [454098]  Admitting Physician: Gery Pray [[id number]]  Attending Physician: Gery Pray [[id number]]  Certification:: I certify this patient will need inpatient services for at least 2 midnights  Expected Medical Readiness: 11/04/2023          B Medical/Surgery History Past Medical History:  Diagnosis Date   DDD (degenerative disc disease), lumbar    Headache    Other disorders of optic nerve, not elsewhere classified, unspecified eye    Stroke (HCC)    TTP (thrombotic thrombocytopenic purpura)    Past Surgical History:  Procedure Laterality Date   CESAREAN SECTION     CHOLECYSTECTOMY     ESOPHAGOGASTRODUODENOSCOPY (EGD) WITH PROPOFOL N/A 04/16/2021   Procedure:  ESOPHAGOGASTRODUODENOSCOPY (EGD) WITH PROPOFOL;  Surgeon: Violeta Gelinas, MD;  Location: [facility] ENDOSCOPY;  Service: General;  Laterality: N/A;   IR CT HEAD LTD  04/02/2021   IR PERCUTANEOUS ART THROMBECTOMY/INFUSION INTRACRANIAL INC DIAG ANGIO  04/02/2021   PEG PLACEMENT N/A 04/16/2021   Procedure: PERCUTANEOUS ENDOSCOPIC GASTROSTOMY (PEG) PLACEMENT;  Surgeon: Violeta Gelinas, MD;  Location: [facility] ENDOSCOPY;  Service: General;  Laterality: N/A;   RADIOLOGY WITH ANESTHESIA N/A 04/02/2021   Procedure: IR WITH ANESTHESIA;  Surgeon: Radiologist, Medication, MD;  Location: [facility] OR;  Service: Radiology;  Laterality: N/A;   TEE WITHOUT CARDIOVERSION N/A 08/22/2017   Procedure: TRANSESOPHAGEAL ECHOCARDIOGRAM (TEE);  Surgeon: Chilton Si, MD;  Location: [facility] ENDOSCOPY;  Service: Cardiovascular;  Laterality: N/A;   WISDOM TOOTH EXTRACTION       A IV Location/Drains/Wounds Patient Lines/Drains/Airways Status     Active Line/Drains/Airways     Name Placement date Placement time Site Days   Peripheral IV 11/01/23 20 G Right Antecubital 11/01/23  --  Antecubital  1   Gastrostomy/Enterostomy Percutaneous endoscopic gastrostomy (PEG) 24 Fr. LUQ 04/16/21  1545  LUQ  930   External Urinary Catheter 05/12/21  0440  --  904   Wound / Incision (Open or Dehisced) 04/02/21 Puncture Groin Right 04/02/21  1428  Groin  944            Intake/Output Last 24 hours No intake or output data in the 24 hours ending 11/02/23 1506  Labs/Imaging Results for orders placed  or performed during the hospital encounter of 11/01/23 (from the past 48 hour(s))  CBG monitoring, ED     Status: None   Collection Time: 11/01/23  6:44 PM  Result Value Ref Range   Glucose-Capillary 90 70 - 99 mg/dL    Comment: Glucose reference range applies only to samples taken after fasting for at least 8 hours.  Protime-INR     Status: None   Collection Time: 11/01/23  6:45 PM  Result Value Ref Range   Prothrombin Time 14.9 11.4 - 15.2 seconds    INR 1.1 0.8 - 1.2    Comment: (NOTE) INR goal varies based on device and disease states. Performed at [facility], [address], [place], [place] [address]   APTT     Status: None   Collection Time: 11/01/23  6:45 PM  Result Value Ref Range   aPTT 31 24 - 36 seconds    Comment: Performed at [facility], [address] [place], [place] [address]  CBC     Status: Abnormal   Collection Time: 11/01/23  6:45 PM  Result Value Ref Range   WBC 8.0 4.0 - 10.5 K/uL   RBC 4.22 3.87 - 5.11 MIL/uL   Hemoglobin 10.0 (L) 12.0 - 15.0 g/dL   HCT 53.6 (L) 64.4 - 03.4 %   MCV 82.7 80.0 - 100.0 fL   MCH 23.7 (L) 26.0 - 34.0 pg   MCHC 28.7 (L) 30.0 - 36.0 g/dL   RDW 74.2 (H) 59.5 - 63.8 %   Platelets 239 150 - 400 K/uL   nRBC 0.0 0.0 - 0.2 %    Comment: Performed at [facility], [address], [place], [place] [address]  Differential     Status: None   Collection Time: 11/01/23  6:45 PM  Result Value Ref Range   Neutrophils Relative % 52 %   Neutro Abs 4.1 1.7 - 7.7 K/uL   Lymphocytes Relative 40 %   Lymphs Abs 3.2 0.7 - 4.0 K/uL   Monocytes Relative 6 %   Monocytes Absolute 0.5 0.1 - 1.0 K/uL   Eosinophils Relative 1 %   Eosinophils Absolute 0.1 0.0 - 0.5 K/uL   Basophils Relative 1 %   Basophils Absolute 0.0 0.0 - 0.1 K/uL   Immature Granulocytes 0 %   Abs Immature Granulocytes 0.02 0.00 - 0.07 K/uL    Comment: Performed at [facility], [address], [place], [place] [address]  Comprehensive metabolic panel     Status: Abnormal   Collection Time: 11/01/23  6:45 PM  Result Value Ref Range   Sodium 141 135 - 145 mmol/L   Potassium 3.8 3.5 - 5.1 mmol/L   Chloride 111 98 - 111 mmol/L   CO2 23 22 - 32 mmol/L   Glucose, Bld 93 70 - 99 mg/dL    Comment: Glucose reference range applies only to samples taken after fasting for at least 8 hours.   BUN 13 6 - 20 mg/dL   Creatinine, Ser 8.84 0.44 - 1.00 mg/dL   Calcium 8.5 (L) 8.9 - 10.3 mg/dL   Total Protein  7.0 6.5 - 8.1 g/dL   Albumin 3.1 (L) 3.5 - 5.0 g/dL   AST 15 15 - 41 U/L   ALT 13 0 - 44 U/L   Alkaline Phosphatase 77 38 - 126 U/L   Total Bilirubin 0.4 <1.2 mg/dL   GFR, Estimated >16 >60 mL/min    Comment: (NOTE)  Calculated using the CKD-EPI Creatinine Equation (2021)    Anion gap 7 5 - 15    Comment: Performed at [facility], [address] [place], [place] [address]  Ethanol     Status: None   Collection Time: 11/01/23  6:45 PM  Result Value Ref Range   Alcohol, Ethyl (B) <10 <10 mg/dL    Comment: (NOTE) Lowest detectable limit for serum alcohol is 10 mg/dL.  For medical purposes only. Performed at [facility], [address], [place], [place] [address]   I-stat chem 8, ed     Status: Abnormal   Collection Time: 11/01/23  6:49 PM  Result Value Ref Range   Sodium 143 135 - 145 mmol/L   Potassium 3.8 3.5 - 5.1 mmol/L   Chloride 108 98 - 111 mmol/L   BUN 13 6 - 20 mg/dL   Creatinine, Ser 3.01 0.44 - 1.00 mg/dL   Glucose, Bld 90 70 - 99 mg/dL    Comment: Glucose reference range applies only to samples taken after fasting for at least 8 hours.   Calcium, Ion 1.08 (L) 1.15 - 1.40 mmol/L   TCO2 23 22 - 32 mmol/L   Hemoglobin 11.6 (L) 12.0 - 15.0 g/dL   HCT 60.1 (L) 09.3 - 23.5 %  Urine rapid drug screen (hosp performed)     Status: None   Collection Time: 11/02/23  1:40 AM  Result Value Ref Range   Opiates NONE DETECTED NONE DETECTED   Cocaine NONE DETECTED NONE DETECTED   Benzodiazepines NONE DETECTED NONE DETECTED   Amphetamines NONE DETECTED NONE DETECTED   Tetrahydrocannabinol NONE DETECTED NONE DETECTED   Barbiturates NONE DETECTED NONE DETECTED    Comment: (NOTE) DRUG SCREEN FOR MEDICAL PURPOSES ONLY.  IF CONFIRMATION IS NEEDED FOR ANY PURPOSE, NOTIFY LAB WITHIN 5 DAYS.  LOWEST DETECTABLE LIMITS FOR URINE DRUG SCREEN Drug Class                     Cutoff (ng/mL) Amphetamine and metabolites    1000 Barbiturate and metabolites     200 Benzodiazepine                 200 Opiates and metabolites        300 Cocaine and metabolites        300 THC                            50 Performed at  [facility], [address] [place], [place] [address]   Urinalysis, Routine w reflex microscopic -Urine, Clean Catch     Status: Abnormal   Collection Time: 11/02/23  1:40 AM  Result Value Ref Range   Color, Urine YELLOW YELLOW   APPearance CLEAR CLEAR   Specific Gravity, Urine >1.046 (H) 1.005 - 1.030   pH 7.0 5.0 - 8.0   Glucose, UA NEGATIVE NEGATIVE mg/dL   Hgb urine dipstick MODERATE (A) NEGATIVE   Bilirubin Urine NEGATIVE NEGATIVE   Ketones, ur NEGATIVE NEGATIVE mg/dL   Protein, ur NEGATIVE NEGATIVE mg/dL   Nitrite NEGATIVE NEGATIVE   Leukocytes,Ua NEGATIVE NEGATIVE   RBC / HPF 0-5 0 - 5 RBC/hpf   WBC, UA 0-5 0 - 5 WBC/hpf   Bacteria, UA NONE SEEN NONE SEEN   Squamous Epithelial / HPF 0-5 0 - 5 /HPF   Mucus PRESENT     Comment: Performed at [facility],  [address] [place], [place] [address]  hCG, serum, qualitative     Status: None   Collection Time: 11/02/23  3:12 AM  Result Value Ref Range   Preg, Serum NEGATIVE NEGATIVE    Comment:        THE SENSITIVITY OF THIS METHODOLOGY IS >10 mIU/mL. Performed at [facility], [address] [place], [place] [address]   Lipid panel     Status: Abnormal   Collection Time: 11/02/23  3:12 AM  Result Value Ref Range   Cholesterol 101 0 - 200 mg/dL   Triglycerides 098 <119 mg/dL   HDL 37 (L) >14 mg/dL   Total CHOL/HDL Ratio 2.7 RATIO   VLDL 27 0 - 40 mg/dL   LDL Cholesterol 37 0 - 99 mg/dL    Comment:        Total Cholesterol/HDL:CHD Risk Coronary Heart Disease Risk Table                     Men   Women  1/2 Average Risk   3.4   3.3  Average Risk       5.0   4.4  2 X Average Risk   9.6   7.1  3 X Average Risk  23.4   11.0        Use the calculated Patient Ratio above and the CHD Risk Table to determine the patient's CHD Risk.        ATP  III CLASSIFICATION (LDL):  <100     mg/dL   Optimal  782-956  mg/dL   Near or Above                    Optimal  130-159  mg/dL   Borderline  213-086  mg/dL   High  >578     mg/dL   Very High Performed at [facility], [address], [place], [place] [address]   Hemoglobin A1c     Status: None   Collection Time: 11/02/23  3:12 AM  Result Value Ref Range   Hgb A1c MFr Bld 5.0 4.8 - 5.6 %    Comment: (NOTE) Pre diabetes:          5.7%-6.4%  Diabetes:              >6.4%  Glycemic control for   <7.0% adults with diabetes    Mean Plasma Glucose 96.8 mg/dL    Comment: Performed at [facility], [address], [place], [place] [address]  CBC     Status: Abnormal   Collection Time: 11/02/23  3:12 AM  Result Value Ref Range   WBC 8.6 4.0 - 10.5 K/uL   RBC 3.93 3.87 - 5.11 MIL/uL   Hemoglobin 9.5 (L) 12.0 - 15.0 g/dL   HCT 13.2 (L) 44.0 - 10.2 %   MCV 80.7 80.0 - 100.0 fL   MCH 24.2 (L) 26.0 - 34.0 pg   MCHC 30.0 30.0 - 36.0 g/dL   RDW 72.5 (H) 36.6 - 44.0 %   Platelets 220 150 - 400 K/uL   nRBC 0.0 0.0 - 0.2 %    Comment: Performed at [facility], [address] [place], [place] [address]  Comprehensive metabolic panel     Status: Abnormal   Collection Time: 11/02/23  3:12 AM  Result Value Ref Range   Sodium 140 135 - 145 mmol/L   Potassium 3.7 3.5 - 5.1 mmol/L  Chloride 111 98 - 111 mmol/L   CO2 22 22 - 32 mmol/L   Glucose, Bld 83 70 - 99 mg/dL    Comment: Glucose reference range applies only to samples taken after fasting for at least 8 hours.   BUN 11 6 - 20 mg/dL   Creatinine, Ser 9.14 0.44 - 1.00 mg/dL   Calcium 8.4 (L) 8.9 - 10.3 mg/dL   Total Protein 6.8 6.5 - 8.1 g/dL   Albumin 3.0 (L) 3.5 - 5.0 g/dL   AST 19 15 - 41 U/L   ALT 13 0 - 44 U/L   Alkaline Phosphatase 73 38 - 126 U/L   Total Bilirubin 0.7 <1.2 mg/dL   GFR, Estimated >78 >29 mL/min    Comment: (NOTE) Calculated using the CKD-EPI Creatinine Equation (2021)    Anion gap 7 5 - 15     Comment: Performed at [facility], [address] [place], [place] [address]   ECHOCARDIOGRAM COMPLETE  Result Date: 11/02/2023    ECHOCARDIOGRAM REPORT   Patient Name:   BO WADDINGTON Fidel Date of Exam: 11/02/2023 Medical Rec #:  [id number]          Height:       65.0 in Accession #:    [id number]         Weight:       273.4 lb Date of Birth:  Sep 29, 1986          BSA:          2.259 m Patient Age:    37 years           BP:           122/82 mmHg Patient Gender: F                  HR:           100 bpm. Exam Location:  Inpatient Procedure: 2D Echo, Cardiac Doppler and Color Doppler Indications:    TIA  History:        Patient has prior history of Echocardiogram examinations, most                 recent 04/03/2021. Risk Factors:Former Smoker.  Sonographer:    Karma Ganja Referring Phys: [phone] DEBBY CROSLEY IMPRESSIONS  1. Left ventricular ejection fraction, by estimation, is 60 to 65%. The left ventricle has normal function. The left ventricle has no regional wall motion abnormalities. Left ventricular diastolic parameters were normal.  2. Right ventricular systolic function is normal. The right ventricular size is normal.  3. The mitral valve is normal in structure. No evidence of mitral valve regurgitation. No evidence of mitral stenosis.  4. The aortic valve is normal in structure. Aortic valve regurgitation is not visualized. No aortic stenosis is present.  5. The inferior vena cava is normal in size with greater than 50% respiratory variability, suggesting right atrial pressure of 3 mmHg. FINDINGS  Left Ventricle: Left ventricular ejection fraction, by estimation, is 60 to 65%. The left ventricle has normal function. The left ventricle has no regional wall motion abnormalities. The left ventricular internal cavity size was normal in size. There is  no left ventricular hypertrophy. Left ventricular diastolic parameters were normal. Right Ventricle: The right ventricular size is normal. No increase in  right ventricular wall thickness. Right ventricular systolic function is normal. Left Atrium: Left atrial size was normal in size. Right Atrium: Right atrial size was normal in size. Pericardium: There is no evidence  of pericardial effusion. Mitral Valve: The mitral valve is normal in structure. No evidence of mitral valve regurgitation. No evidence of mitral valve stenosis. Tricuspid Valve: The tricuspid valve is normal in structure. Tricuspid valve regurgitation is not demonstrated. No evidence of tricuspid stenosis. Aortic Valve: The aortic valve is normal in structure. Aortic valve regurgitation is not visualized. No aortic stenosis is present. Aortic valve mean gradient measures 4.0 mmHg. Aortic valve peak gradient measures 6.7 mmHg. Aortic valve area, by VTI measures 2.06 cm. Pulmonic Valve: The pulmonic valve was normal in structure. Pulmonic valve regurgitation is not visualized. No evidence of pulmonic stenosis. Aorta: The aortic root is normal in size and structure. Venous: The inferior vena cava is normal in size with greater than 50% respiratory variability, suggesting right atrial pressure of 3 mmHg. IAS/Shunts: No atrial level shunt detected by color flow Doppler.  LEFT VENTRICLE PLAX 2D LVIDd:         3.90 cm   Diastology LVIDs:         2.70 cm   LV e' medial:    13.20 cm/s LV PW:         0.90 cm   LV E/e' medial:  7.7 LV IVS:        0.90 cm   LV e' lateral:   14.90 cm/s LVOT diam:     1.80 cm   LV E/e' lateral: 6.8 LV SV:         47 LV SV Index:   21 LVOT Area:     2.54 cm  RIGHT VENTRICLE             IVC RV Basal diam:  3.10 cm     IVC diam: 1.40 cm RV S prime:     14.90 cm/s TAPSE (M-mode): 2.6 cm LEFT ATRIUM             Index        RIGHT ATRIUM          Index LA diam:        2.90 cm 1.28 cm/m   RA Area:     8.03 cm LA Vol (A2C):   29.9 ml 13.24 ml/m  RA Volume:   12.30 ml 5.44 ml/m LA Vol (A4C):   34.3 ml 15.18 ml/m LA Biplane Vol: 33.3 ml 14.74 ml/m  AORTIC VALVE AV Area (Vmax):    1.93  cm AV Area (Vmean):   1.88 cm AV Area (VTI):     2.06 cm AV Vmax:           129.00 cm/s AV Vmean:          89.300 cm/s AV VTI:            0.228 m AV Peak Grad:      6.7 mmHg AV Mean Grad:      4.0 mmHg LVOT Vmax:         98.00 cm/s LVOT Vmean:        65.800 cm/s LVOT VTI:          0.185 m LVOT/AV VTI ratio: 0.81  AORTA Ao Root diam: 2.90 cm Ao Asc diam:  2.70 cm MITRAL VALVE MV Area (PHT): 5.13 cm     SHUNTS MV Decel Time: 148 msec     Systemic VTI:  0.18 m MV E velocity: 102.00 cm/s  Systemic Diam: 1.80 cm MV A velocity: 113.00 cm/s MV E/A ratio:  0.90 Arvilla Meres MD Electronically signed by Arvilla Meres MD Signature Date/Time:  11/02/2023/9:39:06 AM    Final    EEG adult  Result Date: 11/02/2023 Charlsie Quest, MD     11/02/2023  6:05 AM Patient Name: SHEBA SEFTON MRN: [id number] Epilepsy Attending: Charlsie Quest Referring Physician/Provider: Rejeana Brock, MD Date: 11/02/2023 Duration: 27.22 mins Patient history: 37 yo F with acute onset of right hemiparesis, decreased sensation to right side. EEG to evaluate for seizure Level of alertness: Awake AEDs during EEG study: TPM Technical aspects: This EEG study was done with scalp electrodes positioned according to the 10-20 International system of electrode placement. Electrical activity was reviewed with band pass filter of 1-70Hz , sensitivity of 7 uV/mm, display speed of 72mm/sec with a 60Hz  notched filter applied as appropriate. EEG data were recorded continuously and digitally stored.  Video monitoring was available and reviewed as appropriate. Description: The posterior dominant rhythm consists of 8 Hz activity of moderate voltage (25-35 uV) seen predominantly in posterior head regions, symmetric and reactive to eye opening and eye closing. EEG showed continuous 3 to 6 Hz theta-delta slowing in right fronto-temporo-parietal region. Hyperventilation and photic stimulation were not performed.   ABNORMALITY - Continuous slow,  right fronto-temporo-parietal region IMPRESSION: This study is suggestive of cortical dysfunction arising from right fronto-temporo-parietal region likely secondary to underlying stroke. No seizures or epileptiform discharges were seen throughout the recording. Charlsie Quest   MR BRAIN WO CONTRAST  Result Date: 11/02/2023 CLINICAL DATA:  Stroke suspected, left-sided weakness EXAM: MRI HEAD WITHOUT CONTRAST TECHNIQUE: Multiplanar, multiecho pulse sequences of the brain and surrounding structures were obtained without intravenous contrast. COMPARISON:  03/06/2019 MRI head, 11/01/2023 and 04/05/2021 CT head FINDINGS: Brain: No restricted diffusion to suggest acute or subacute infarct. No acute hemorrhage, mass, mass effect, or midline shift. No hydrocephalus or extra-axial collection. Pituitary and craniocervical junction within normal limits. Hemosiderin deposition is associated with remote right MCA and posterior left MCA territory infarcts, likely petechial hemorrhage. No other hemosiderin deposition to suggest additional hemorrhage. Ex vacuo dilatation of the right lateral ventricle. Wallerian degeneration in the right cerebral peduncle and midbrain. Vascular: Normal arterial flow voids. Skull and upper cervical spine: Normal marrow signal. Sinuses/Orbits: Clear paranasal sinuses. No acute finding in the orbits. Other: The mastoid air cells are well aerated. IMPRESSION: No acute intracranial process. No evidence of acute or subacute infarct. Electronically Signed   By: Wiliam Ke M.D.   On: 11/02/2023 00:24   CT HEAD CODE STROKE WO CONTRAST`  Result Date: 11/01/2023 CLINICAL DATA:  Left-sided weakness, stroke suspected EXAM: CT HEAD WITHOUT CONTRAST CT ANGIOGRAPHY OF THE HEAD AND NECK TECHNIQUE: Contiguous axial images were obtained from the base of the skull through the vertex without intravenous contrast. Multidetector CT imaging of the head and neck was performed using the standard protocol  during bolus administration of intravenous contrast. Multiplanar CT image reconstructions and MIPs were obtained to evaluate the vascular anatomy. Carotid stenosis measurements (when applicable) are obtained utilizing NASCET criteria, using the distal internal carotid diameter as the denominator. RADIATION DOSE REDUCTION: This exam was performed according to the departmental dose-optimization program which includes automated exposure control, adjustment of the mA and/or kV according to patient size and/or use of iterative reconstruction technique. CONTRAST:  75mL OMNIPAQUE IOHEXOL 350 MG/ML SOLN COMPARISON:  04/05/2021 CT head, 04/02/2021 CTA head and neck FINDINGS: CT HEAD Brain: No evidence of acute infarction, hemorrhage, mass, mass effect, or midline shift. No hydrocephalus or extra-axial collection. Extensive encephalomalacia in the right MCA territory is consistent with evolution the  acute infarct seen on 04/05/2021. Additional more remote encephalomalacia in left parietal and temporal lobe appears unchanged. Vascular: No hyperdense vessel. Skull: Negative for fracture or focal lesion. Sinuses/Orbits: No acute finding. Other: The mastoid air cells are well aerated. CTA NECK FINDINGS Aortic arch: Incompletely imaged. Common origin of the brachiocephalic and left common carotid arteries. No significant stenosis of the imaged major arch vessel origins. Right carotid system: No evidence of stenosis, dissection, or occlusion. Left carotid system: No evidence of stenosis, dissection, or occlusion. Vertebral arteries: No evidence of stenosis, dissection, or occlusion. Skeleton: No acute osseous abnormality. Degenerative changes in the cervical spine. Other neck: No acute finding. Upper chest: No focal pulmonary opacity or pleural effusion. Review of the MIP images confirms the above findings CTA HEAD FINDINGS Anterior circulation: Both internal carotid arteries are patent to the termini, without significant stenosis.  A1 segments patent. Normal anterior communicating artery. Anterior cerebral arteries are patent to their distal aspects without significant stenosis. Moderate stenosis in the right M1 (series 7, image 104), which is otherwise patent. Right MCA branches are patent to their distal aspects without significant stenosis. No stenosis in left M1 or left MCA branches. Posterior circulation: The right V4 primarily supplies the right PICA. Vertebral arteries patent to the vertebrobasilar junction without significant stenosis. Posterior inferior cerebellar arteries patent proximally. Basilar patent to its distal aspect without significant stenosis. Superior cerebellar arteries patent proximally. Patent P1 segments. Near fetal origin of the bilateral PCAs from the posterior communicating arteries. PCAs perfused to their distal aspects without significant stenosis. The bilateral posterior communicating arteries are not visualized. Venous sinuses: As permitted by contrast timing, patent. Anatomic variants: Near fetal origin of the bilateral posterior communicating arteries. No evidence of aneurysm or vascular malformation. Review of the MIP images confirms the above findings IMPRESSION: 1. No acute intracranial process. Extensive encephalomalacia in the right MCA territory is consistent with evolution of the acute infarct seen on 04/05/2021. 2. No intracranial large vessel occlusion. Moderate stenosis in the right M1. 3. No hemodynamically significant stenosis in the neck. Imaging results from the noncontrast CT were communicated on 11/01/2023 at 7:11 pm to provider Dr. Derry Lory via secure text paging. Imaging results from the CTA were communicated on 11/01/2023 at 7:18 pm to provider Dr. Derry Lory via secure text paging. Electronically Signed   By: Wiliam Ke M.D.   On: 11/01/2023 19:18   CT ANGIO HEAD NECK W WO CM (CODE STROKE)  Result Date: 11/01/2023 CLINICAL DATA:  Left-sided weakness, stroke suspected EXAM: CT  HEAD WITHOUT CONTRAST CT ANGIOGRAPHY OF THE HEAD AND NECK TECHNIQUE: Contiguous axial images were obtained from the base of the skull through the vertex without intravenous contrast. Multidetector CT imaging of the head and neck was performed using the standard protocol during bolus administration of intravenous contrast. Multiplanar CT image reconstructions and MIPs were obtained to evaluate the vascular anatomy. Carotid stenosis measurements (when applicable) are obtained utilizing NASCET criteria, using the distal internal carotid diameter as the denominator. RADIATION DOSE REDUCTION: This exam was performed according to the departmental dose-optimization program which includes automated exposure control, adjustment of the mA and/or kV according to patient size and/or use of iterative reconstruction technique. CONTRAST:  75mL OMNIPAQUE IOHEXOL 350 MG/ML SOLN COMPARISON:  04/05/2021 CT head, 04/02/2021 CTA head and neck FINDINGS: CT HEAD Brain: No evidence of acute infarction, hemorrhage, mass, mass effect, or midline shift. No hydrocephalus or extra-axial collection. Extensive encephalomalacia in the right MCA territory is consistent with evolution the acute infarct  seen on 04/05/2021. Additional more remote encephalomalacia in left parietal and temporal lobe appears unchanged. Vascular: No hyperdense vessel. Skull: Negative for fracture or focal lesion. Sinuses/Orbits: No acute finding. Other: The mastoid air cells are well aerated. CTA NECK FINDINGS Aortic arch: Incompletely imaged. Common origin of the brachiocephalic and left common carotid arteries. No significant stenosis of the imaged major arch vessel origins. Right carotid system: No evidence of stenosis, dissection, or occlusion. Left carotid system: No evidence of stenosis, dissection, or occlusion. Vertebral arteries: No evidence of stenosis, dissection, or occlusion. Skeleton: No acute osseous abnormality. Degenerative changes in the cervical spine.  Other neck: No acute finding. Upper chest: No focal pulmonary opacity or pleural effusion. Review of the MIP images confirms the above findings CTA HEAD FINDINGS Anterior circulation: Both internal carotid arteries are patent to the termini, without significant stenosis. A1 segments patent. Normal anterior communicating artery. Anterior cerebral arteries are patent to their distal aspects without significant stenosis. Moderate stenosis in the right M1 (series 7, image 104), which is otherwise patent. Right MCA branches are patent to their distal aspects without significant stenosis. No stenosis in left M1 or left MCA branches. Posterior circulation: The right V4 primarily supplies the right PICA. Vertebral arteries patent to the vertebrobasilar junction without significant stenosis. Posterior inferior cerebellar arteries patent proximally. Basilar patent to its distal aspect without significant stenosis. Superior cerebellar arteries patent proximally. Patent P1 segments. Near fetal origin of the bilateral PCAs from the posterior communicating arteries. PCAs perfused to their distal aspects without significant stenosis. The bilateral posterior communicating arteries are not visualized. Venous sinuses: As permitted by contrast timing, patent. Anatomic variants: Near fetal origin of the bilateral posterior communicating arteries. No evidence of aneurysm or vascular malformation. Review of the MIP images confirms the above findings IMPRESSION: 1. No acute intracranial process. Extensive encephalomalacia in the right MCA territory is consistent with evolution of the acute infarct seen on 04/05/2021. 2. No intracranial large vessel occlusion. Moderate stenosis in the right M1. 3. No hemodynamically significant stenosis in the neck. Imaging results from the noncontrast CT were communicated on 11/01/2023 at 7:11 pm to provider Dr. Derry Lory via secure text paging. Imaging results from the CTA were communicated on  11/01/2023 at 7:18 pm to provider Dr. Derry Lory via secure text paging. Electronically Signed   By: Wiliam Ke M.D.   On: 11/01/2023 19:18    Pending Labs Unresulted Labs (From admission, onward)    None       Vitals/Pain Today's Vitals   11/02/23 1146 11/02/23 1412 11/02/23 1430 11/02/23 1445  BP: 118/84  139/89   Pulse: (!) 114  100 91  Resp: 17  14 19   Temp:  98.1 F (36.7 C)    TempSrc:  Oral    SpO2: 99%  100% 100%  Weight:      Height:      PainSc:        Isolation Precautions No active isolations  Medications Medications   stroke: early stages of recovery book (has no administration in time range)  0.9 %  sodium chloride infusion ( Intravenous New Bag/Given 11/02/23 0157)  acetaminophen (TYLENOL) tablet 650 mg (has no administration in time range)    Or  acetaminophen (TYLENOL) 160 MG/5ML solution 650 mg (has no administration in time range)    Or  acetaminophen (TYLENOL) suppository 650 mg (has no administration in time range)  senna-docusate (Senokot-S) tablet 1 tablet (has no administration in time range)  metoprolol tartrate (LOPRESSOR) injection 5  mg (has no administration in time range)  apixaban (ELIQUIS) tablet 5 mg (5 mg Oral Given 11/02/23 1014)  atorvastatin (LIPITOR) tablet 40 mg (40 mg Oral Given 11/02/23 1014)  ezetimibe (ZETIA) tablet 10 mg (10 mg Oral Given 11/02/23 1014)  topiramate (TOPAMAX) tablet 150 mg (150 mg Oral Given 11/02/23 1014)  iohexol (OMNIPAQUE) 350 MG/ML injection 75 mL (75 mLs Intravenous Contrast Given 11/01/23 1905)    Mobility non-ambulatory     Focused Assessments Neuro Assessment Handoff:  Swallow screen pass? Yes  Cardiac Rhythm: Normal sinus rhythm NIH Stroke Scale  Dizziness Present: No Headache Present: No Interval: Shift assessment Level of Consciousness (1a.)   : Alert, keenly responsive LOC Questions (1b. )   : Answers both questions correctly LOC Commands (1c. )   : Performs both tasks  correctly Best Gaze (2. )  : Normal Visual (3. )  : No visual loss Facial Palsy (4. )    : Normal symmetrical movements Motor Arm, Left (5a. )   : No effort against gravity Motor Arm, Right (5b. ) : Drift Motor Leg, Left (6a. )  : No effort against gravity Motor Leg, Right (6b. ) : Some effort against gravity Limb Ataxia (7. ): Absent Sensory (8. )  : Normal, no sensory loss Best Language (9. )  : Mild-to-moderate aphasia Dysarthria (10. ): Normal Extinction/Inattention (11.)   : No Abnormality Complete NIHSS TOTAL: 10 Last date known well: 11/01/23 Last time known well: 1645 Neuro Assessment: Within Defined Limits Neuro Checks:   Initial (11/01/23 1900)  Has TPA been given? No If patient is a Neuro Trauma and patient is going to OR before floor call report to 4N Charge nurse: [phone] or [phone]   R Recommendations: See Admitting Provider Note  Report given to:   Additional Notes:

## 2023-11-02 NOTE — ED Notes (Signed)
CCM called for patient to be monitored in room 39

## 2023-11-02 NOTE — Progress Notes (Signed)
\   Echocardiogram 2D Echocardiogram has been performed.  Krista Maddox 11/02/2023, 9:27 AM

## 2023-11-02 NOTE — Plan of Care (Signed)

## 2023-11-02 NOTE — Progress Notes (Signed)
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  She is maintained on Eliquis for her recurrent strokes. CT head shows Extensive encephalomalacia in the right MCA territory is consistent with evolution of the acute infarct seen on 04/05/2021.  Neurology was consulted EEG ordered  Vitals:   11/02/23 0200 11/02/23 0530  BP: 120/89 122/83  Pulse: 84 80  Resp: 16 15  Temp: 98.3 F (36.8 C) 98.3 F (36.8 C)  SpO2: 95% 97%      A/P Right upper extremity numbness in setting of history of cryptogenic strokes -Neurology consulted -Stroke workup initiated -EEG obtained showed right frontotemporoparietal cortical dysfunction, likely from underlying stroke    [place] [facility]

## 2023-11-02 NOTE — H&P (Signed)
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  She is maintained on Eliquis for her recurrent strokes.  Patient is nonverbal and hard of hearing.  However she can be communicated with by writing.  Patient sent to the ER after developing RUE weakness and numbness.  Patient endorses headaches but she also has had chronic headaches  Patient's presenting vitals 105/81, 89, 15, afebrile.  UA negative, UDS normal.  CT head shows Extensive encephalomalacia in the right MCA territory is consistent with evolution of the acute infarct seen on 04/05/2021. MRI brain shows No acute intracranial process. No evidence of acute or subacute infarct. It is difficult obtaining history from patient.  Patient exhibits echolalia.  She is alert and oriented and able to be communicated with through writing.  Review of Systems:  Per HPI.  History limited by patient's hard of hearing and nonverbal state.  Past Medical History: Past Medical History:  Diagnosis Date   DDD (degenerative disc disease), lumbar    Headache    Other disorders of optic nerve, not elsewhere classified, unspecified eye    Stroke (HCC)    TTP (thrombotic thrombocytopenic purpura)    Past Surgical History:  Procedure Laterality Date   CESAREAN SECTION     CHOLECYSTECTOMY     ESOPHAGOGASTRODUODENOSCOPY (EGD) WITH PROPOFOL N/A 04/16/2021   Procedure: ESOPHAGOGASTRODUODENOSCOPY (EGD) WITH PROPOFOL;  Surgeon: Violeta Gelinas, MD;  Location: [facility] ENDOSCOPY;  Service: General;  Laterality: N/A;   IR CT HEAD LTD  04/02/2021   IR PERCUTANEOUS ART THROMBECTOMY/INFUSION INTRACRANIAL INC DIAG ANGIO  04/02/2021   PEG PLACEMENT N/A 04/16/2021   Procedure: PERCUTANEOUS ENDOSCOPIC GASTROSTOMY (PEG) PLACEMENT;  Surgeon: Violeta Gelinas, MD;  Location: [facility] ENDOSCOPY;  Service: General;  Laterality: N/A;   RADIOLOGY  WITH ANESTHESIA N/A 04/02/2021   Procedure: IR WITH ANESTHESIA;  Surgeon: Radiologist, Medication, MD;  Location: [facility] OR;  Service: Radiology;  Laterality: N/A;   TEE WITHOUT CARDIOVERSION N/A 08/22/2017   Procedure: TRANSESOPHAGEAL ECHOCARDIOGRAM (TEE);  Surgeon: Chilton Si, MD;  Location: [facility] ENDOSCOPY;  Service: Cardiovascular;  Laterality: N/A;   WISDOM TOOTH EXTRACTION      Medications: Prior to Admission medications   Medication Sig Start Date End Date Taking? Authorizing Provider  apixaban (ELIQUIS) 5 MG TABS tablet Take 5 mg by mouth 2 (two) times daily. 09/15/23  Yes [provider]  busPIRone (BUSPAR) 5 MG tablet Take 5 mg by mouth 2 (two) times daily. 09/30/23  Yes [provider]  cyclobenzaprine (FLEXERIL) 10 MG tablet Take 10 mg by mouth 3 (three) times daily as needed for muscle spasms. 09/30/23  Yes [provider]  gabapentin (NEURONTIN) 300 MG capsule Take 300 mg by mouth 2 (two) times daily. 10/11/23  Yes [provider]  OLANZapine (ZYPREXA) 2.5 MG tablet Take 2.5 mg by mouth at bedtime. 06/15/23  Yes [provider]  warfarin (COUMADIN) 7.5 MG tablet Take 1 tablet (7.5 mg total) by mouth every Tuesday, Thursday, Saturday, and Sunday at 6 PM. 05/16/21  Yes Bailey-Modzik, Delila A, NP  acetaminophen (TYLENOL) 160 MG/5ML solution Place 20.3 mLs (650 mg total) into feeding tube every 4 (four) hours as needed for mild pain (or temp > 37.5 C (99.5 F)). 05/15/21   Bailey-Modzik, Delila A, NP  acetaminophen (TYLENOL) 325 MG tablet Take 2 tablets (650 mg total) by mouth every 4 (four) hours  as needed for mild pain (or temp > 37.5 C (99.5 F)). 05/15/21   Bailey-Modzik, Delila A, NP  acetaminophen (TYLENOL) 650 MG suppository Place 1 suppository (650 mg total) rectally every 4 (four) hours as needed for mild pain (or temp > 37.5 C (99.5 F)). 05/15/21   Bailey-Modzik, Delila A, NP  atenolol (TENORMIN) 25 MG tablet Take 1 tablet (25 mg total) by mouth  daily. 05/16/21   Bailey-Modzik, Delila A, NP  atorvastatin (LIPITOR) 80 MG tablet Place 1 tablet (80 mg total) into feeding tube daily. 05/16/21   Bailey-Modzik, Delila A, NP  bisacodyl (DULCOLAX) 10 MG suppository Place 1 suppository (10 mg total) rectally daily as needed for moderate constipation. 05/15/21   Bailey-Modzik, Delila A, NP  ezetimibe (ZETIA) 10 MG tablet Place 1 tablet (10 mg total) into feeding tube daily. 05/16/21   Bailey-Modzik, Delila A, NP  Nutritional Supplements (FEEDING SUPPLEMENT, OSMOLITE 1.5 CAL,) LIQD Place 1,000 mLs into feeding tube daily. 05/15/21   Bailey-Modzik, Delila A, NP  Nutritional Supplements (FEEDING SUPPLEMENT, PROSOURCE TF,) liquid Place 90 mLs into feeding tube 2 (two) times daily. 05/15/21   Bailey-Modzik, Delila A, NP  pantoprazole sodium (PROTONIX) 40 mg/20 mL PACK Place 20 mLs (40 mg total) into feeding tube daily. 05/16/21   Bailey-Modzik, Delila A, NP  polyethylene glycol (MIRALAX / GLYCOLAX) 17 g packet Place 17 g into feeding tube daily. 05/16/21   Bailey-Modzik, Delila A, NP  senna-docusate (SENOKOT-S) 8.6-50 MG tablet Place 1 tablet into feeding tube 2 (two) times daily. 05/15/21   Bailey-Modzik, Delila A, NP  topiramate (TOPAMAX) 50 MG tablet Place 3 tablets (150 mg total) into feeding tube 2 (two) times daily. 05/15/21   Bailey-Modzik, Delila A, NP  venlafaxine (EFFEXOR) 75 MG tablet Place 2 tablets (150 mg total) into feeding tube 2 (two) times daily. 05/15/21   Bailey-Modzik, Jillene Bucks A, NP  warfarin (COUMADIN) 10 MG tablet Take 1 tablet (10 mg total) by mouth every Monday, Wednesday, and Friday at 6 PM. 05/15/21   Bailey-Modzik, Delila A, NP  Water For Irrigation, Sterile (FREE WATER) SOLN Place 150 mLs into feeding tube every 4 (four) hours. 05/15/21   Bailey-Modzik, Ward Chatters, NP    Allergies:   Allergies  Allergen Reactions   Tape Rash    Cannot tolerate paper tape    Social History:  reports that she quit smoking about 6 years ago. She has never used  smokeless tobacco. She reports that she does not drink alcohol and does not use drugs.  Family History: Family History  Problem Relation Age of Onset   Diabetes Mother    Hypertension Mother    Ovarian cancer Mother        Hysterectomy   Healthy Father    Ovarian cancer Maternal Grandmother        Hysterectomy    Physical Exam: Vitals:   11/01/23 1800 11/01/23 1922 11/01/23 2100 11/02/23 0100  BP:  105/81 104/87 117/71  Pulse:  89 87 79  Resp:   15 16  Temp:    98.3 F (36.8 C)  TempSrc:  Oral    SpO2:  98% 100% 100%  Weight: 124.4 kg       General: A & O x 3, obese, no acute distress Eyes: Pink conjunctiva, no scleral icterus ENT: Moist oral mucosa, neck supple, no thyromegaly Lungs: CTA B/L,  no use of accessory muscles Cardiovascular: RRR, no o murmurs. No carotid bruits, no JVD Abdomen: soft, positive BS, non-tender, non-distended, no organomegaly,  not an acute abdomen GU: not examined Neuro: CN II - XII grossly intact, sensation intact Musculoskeletal: LUE flacid, RUE 4/5 , LUE/LLE: 3/5 .  Decreased sensation      Skin: no rash, no subcutaneous crepitation, no decubitus Psych: appropriate patient   Labs on Admission:  Recent Labs    11/01/23 1845 11/01/23 1849  NA 141 143  K 3.8 3.8  CL 111 108  CO2 23  --   GLUCOSE 93 90  BUN 13 13  CREATININE 0.83 0.90  CALCIUM 8.5*  --    Recent Labs    11/01/23 1845  AST 15  ALT 13  ALKPHOS 77  BILITOT 0.4  PROT 7.0  ALBUMIN 3.1*    Recent Labs    11/01/23 1845 11/01/23 1849  WBC 8.0  --   NEUTROABS 4.1  --   HGB 10.0* 11.6*  HCT 34.9* 34.0*  MCV 82.7  --   PLT 239  --     Radiological Exams on Admission: MR BRAIN WO CONTRAST  Result Date: 11/02/2023 CLINICAL DATA:  Stroke suspected, left-sided weakness EXAM: MRI HEAD WITHOUT CONTRAST TECHNIQUE: Multiplanar, multiecho pulse sequences of the brain and surrounding structures were obtained without intravenous contrast. COMPARISON:  03/06/2019 MRI  head, 11/01/2023 and 04/05/2021 CT head FINDINGS: Brain: No restricted diffusion to suggest acute or subacute infarct. No acute hemorrhage, mass, mass effect, or midline shift. No hydrocephalus or extra-axial collection. Pituitary and craniocervical junction within normal limits. Hemosiderin deposition is associated with remote right MCA and posterior left MCA territory infarcts, likely petechial hemorrhage. No other hemosiderin deposition to suggest additional hemorrhage. Ex vacuo dilatation of the right lateral ventricle. Wallerian degeneration in the right cerebral peduncle and midbrain. Vascular: Normal arterial flow voids. Skull and upper cervical spine: Normal marrow signal. Sinuses/Orbits: Clear paranasal sinuses. No acute finding in the orbits. Other: The mastoid air cells are well aerated. IMPRESSION: No acute intracranial process. No evidence of acute or subacute infarct. Electronically Signed   By: Wiliam Ke M.D.   On: 11/02/2023 00:24   CT HEAD CODE STROKE WO CONTRAST`  Result Date: 11/01/2023 CLINICAL DATA:  Left-sided weakness, stroke suspected EXAM: CT HEAD WITHOUT CONTRAST CT ANGIOGRAPHY OF THE HEAD AND NECK TECHNIQUE: Contiguous axial images were obtained from the base of the skull through the vertex without intravenous contrast. Multidetector CT imaging of the head and neck was performed using the standard protocol during bolus administration of intravenous contrast. Multiplanar CT image reconstructions and MIPs were obtained to evaluate the vascular anatomy. Carotid stenosis measurements (when applicable) are obtained utilizing NASCET criteria, using the distal internal carotid diameter as the denominator. RADIATION DOSE REDUCTION: This exam was performed according to the departmental dose-optimization program which includes automated exposure control, adjustment of the mA and/or kV according to patient size and/or use of iterative reconstruction technique. CONTRAST:  75mL OMNIPAQUE  IOHEXOL 350 MG/ML SOLN COMPARISON:  04/05/2021 CT head, 04/02/2021 CTA head and neck FINDINGS: CT HEAD Brain: No evidence of acute infarction, hemorrhage, mass, mass effect, or midline shift. No hydrocephalus or extra-axial collection. Extensive encephalomalacia in the right MCA territory is consistent with evolution the acute infarct seen on 04/05/2021. Additional more remote encephalomalacia in left parietal and temporal lobe appears unchanged. Vascular: No hyperdense vessel. Skull: Negative for fracture or focal lesion. Sinuses/Orbits: No acute finding. Other: The mastoid air cells are well aerated. CTA NECK FINDINGS Aortic arch: Incompletely imaged. Common origin of the brachiocephalic and left common carotid arteries. No significant stenosis of the  imaged major arch vessel origins. Right carotid system: No evidence of stenosis, dissection, or occlusion. Left carotid system: No evidence of stenosis, dissection, or occlusion. Vertebral arteries: No evidence of stenosis, dissection, or occlusion. Skeleton: No acute osseous abnormality. Degenerative changes in the cervical spine. Other neck: No acute finding. Upper chest: No focal pulmonary opacity or pleural effusion. Review of the MIP images confirms the above findings CTA HEAD FINDINGS Anterior circulation: Both internal carotid arteries are patent to the termini, without significant stenosis. A1 segments patent. Normal anterior communicating artery. Anterior cerebral arteries are patent to their distal aspects without significant stenosis. Moderate stenosis in the right M1 (series 7, image 104), which is otherwise patent. Right MCA branches are patent to their distal aspects without significant stenosis. No stenosis in left M1 or left MCA branches. Posterior circulation: The right V4 primarily supplies the right PICA. Vertebral arteries patent to the vertebrobasilar junction without significant stenosis. Posterior inferior cerebellar arteries patent proximally.  Basilar patent to its distal aspect without significant stenosis. Superior cerebellar arteries patent proximally. Patent P1 segments. Near fetal origin of the bilateral PCAs from the posterior communicating arteries. PCAs perfused to their distal aspects without significant stenosis. The bilateral posterior communicating arteries are not visualized. Venous sinuses: As permitted by contrast timing, patent. Anatomic variants: Near fetal origin of the bilateral posterior communicating arteries. No evidence of aneurysm or vascular malformation. Review of the MIP images confirms the above findings IMPRESSION: 1. No acute intracranial process. Extensive encephalomalacia in the right MCA territory is consistent with evolution of the acute infarct seen on 04/05/2021. 2. No intracranial large vessel occlusion. Moderate stenosis in the right M1. 3. No hemodynamically significant stenosis in the neck. Imaging results from the noncontrast CT were communicated on 11/01/2023 at 7:11 pm to provider Dr. Derry Lory via secure text paging. Imaging results from the CTA were communicated on 11/01/2023 at 7:18 pm to provider Dr. Derry Lory via secure text paging. Electronically Signed   By: Wiliam Ke M.D.   On: 11/01/2023 19:18   CT ANGIO HEAD NECK W WO CM (CODE STROKE)  Result Date: 11/01/2023 CLINICAL DATA:  Left-sided weakness, stroke suspected EXAM: CT HEAD WITHOUT CONTRAST CT ANGIOGRAPHY OF THE HEAD AND NECK TECHNIQUE: Contiguous axial images were obtained from the base of the skull through the vertex without intravenous contrast. Multidetector CT imaging of the head and neck was performed using the standard protocol during bolus administration of intravenous contrast. Multiplanar CT image reconstructions and MIPs were obtained to evaluate the vascular anatomy. Carotid stenosis measurements (when applicable) are obtained utilizing NASCET criteria, using the distal internal carotid diameter as the denominator. RADIATION  DOSE REDUCTION: This exam was performed according to the departmental dose-optimization program which includes automated exposure control, adjustment of the mA and/or kV according to patient size and/or use of iterative reconstruction technique. CONTRAST:  75mL OMNIPAQUE IOHEXOL 350 MG/ML SOLN COMPARISON:  04/05/2021 CT head, 04/02/2021 CTA head and neck FINDINGS: CT HEAD Brain: No evidence of acute infarction, hemorrhage, mass, mass effect, or midline shift. No hydrocephalus or extra-axial collection. Extensive encephalomalacia in the right MCA territory is consistent with evolution the acute infarct seen on 04/05/2021. Additional more remote encephalomalacia in left parietal and temporal lobe appears unchanged. Vascular: No hyperdense vessel. Skull: Negative for fracture or focal lesion. Sinuses/Orbits: No acute finding. Other: The mastoid air cells are well aerated. CTA NECK FINDINGS Aortic arch: Incompletely imaged. Common origin of the brachiocephalic and left common carotid arteries. No significant stenosis of the imaged major  arch vessel origins. Right carotid system: No evidence of stenosis, dissection, or occlusion. Left carotid system: No evidence of stenosis, dissection, or occlusion. Vertebral arteries: No evidence of stenosis, dissection, or occlusion. Skeleton: No acute osseous abnormality. Degenerative changes in the cervical spine. Other neck: No acute finding. Upper chest: No focal pulmonary opacity or pleural effusion. Review of the MIP images confirms the above findings CTA HEAD FINDINGS Anterior circulation: Both internal carotid arteries are patent to the termini, without significant stenosis. A1 segments patent. Normal anterior communicating artery. Anterior cerebral arteries are patent to their distal aspects without significant stenosis. Moderate stenosis in the right M1 (series 7, image 104), which is otherwise patent. Right MCA branches are patent to their distal aspects without significant  stenosis. No stenosis in left M1 or left MCA branches. Posterior circulation: The right V4 primarily supplies the right PICA. Vertebral arteries patent to the vertebrobasilar junction without significant stenosis. Posterior inferior cerebellar arteries patent proximally. Basilar patent to its distal aspect without significant stenosis. Superior cerebellar arteries patent proximally. Patent P1 segments. Near fetal origin of the bilateral PCAs from the posterior communicating arteries. PCAs perfused to their distal aspects without significant stenosis. The bilateral posterior communicating arteries are not visualized. Venous sinuses: As permitted by contrast timing, patent. Anatomic variants: Near fetal origin of the bilateral posterior communicating arteries. No evidence of aneurysm or vascular malformation. Review of the MIP images confirms the above findings IMPRESSION: 1. No acute intracranial process. Extensive encephalomalacia in the right MCA territory is consistent with evolution of the acute infarct seen on 04/05/2021. 2. No intracranial large vessel occlusion. Moderate stenosis in the right M1. 3. No hemodynamically significant stenosis in the neck. Imaging results from the noncontrast CT were communicated on 11/01/2023 at 7:11 pm to provider Dr. Derry Lory via secure text paging. Imaging results from the CTA were communicated on 11/01/2023 at 7:18 pm to provider Dr. Derry Lory via secure text paging. Electronically Signed   By: Wiliam Ke M.D.   On: 11/01/2023 19:18    Assessment/Plan Present on Admission:  Paresthesia and numbness RUE//  cryptogenic stroke  -TIA order set initiated.  Completing stroke workup -MRI ordered, results pending.  2D echo ordered. -Allow for permissive hypertension keeping SBP less than 220, DBP less than 110. -Neurochecks every 2 hours -Eliquis resumed 5 mg p.o. twice daily -PT/OT/speech consult placed -Zetia, atorvastatin  resumed -NPO  Migraine/headache -Topamax resumed  Anxiety -Buspar, Effexor resumed   Krista Maddox 11/02/2023, 1:17 AM

## 2023-11-02 NOTE — Progress Notes (Signed)
Pt not available for EEG at the moment, moving to another room.  Tech will contact the nurse at new room.

## 2023-11-02 NOTE — Plan of Care (Signed)
  Problem: Nutrition: Goal: Risk of aspiration will decrease Outcome: Progressing   

## 2023-11-02 NOTE — Progress Notes (Addendum)
STROKE TEAM PROGRESS NOTE   BRIEF HPI [name] is a 37 y.o. 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  She is maintained on Eliquis for her recurrent strokes.  Patient is nonverbal and hard of hearing.  However she can be communicated with by writing.  Patient sent to the ER after developing RUE weakness and numbness.  Patient endorses headaches but she also has had chronic headaches   NIH on Admission 18  Baseline MRS 4-5  INTERIM HISTORY/SUBJECTIVE Seen in room with home nurse at the bedside.  At baseline she is nonverbal, hard of hearing.  She is on Eliquis.  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 MRI is negative for recurrent stroke.  OBJECTIVE  CBC    Component Value Date/Time   WBC 8.6 11/02/2023 0312   RBC 3.93 11/02/2023 0312   HGB 9.5 (L) 11/02/2023 0312   HGB 13.9 07/26/2018 1610   HCT 31.7 (L) 11/02/2023 0312   HCT 41.5 07/26/2018 1610   PLT 220 11/02/2023 0312   PLT 244 07/26/2018 1610   MCV 80.7 11/02/2023 0312   MCV 94 07/26/2018 1610   MCH 24.2 (L) 11/02/2023 0312   MCHC 30.0 11/02/2023 0312   RDW 16.5 (H) 11/02/2023 0312   RDW 13.2 07/26/2018 1610   LYMPHSABS 3.2 11/01/2023 1845   LYMPHSABS 2.9 07/26/2018 1610   MONOABS 0.5 11/01/2023 1845   EOSABS 0.1 11/01/2023 1845   EOSABS 0.2 07/26/2018 1610   BASOSABS 0.0 11/01/2023 1845   BASOSABS 0.0 07/26/2018 1610    BMET    Component Value Date/Time   NA 140 11/02/2023 0312   NA 141 07/26/2018 1610   K 3.7 11/02/2023 0312   CL 111 11/02/2023 0312   CO2 22 11/02/2023 0312   GLUCOSE 83 11/02/2023 0312   BUN 11 11/02/2023 0312   BUN 10 07/26/2018 1610   CREATININE 0.75 11/02/2023 0312   CALCIUM 8.4 (L) 11/02/2023 0312   GFRNONAA >60 11/02/2023 0312    IMAGING past 24 hours EEG adult  Result Date: 11/02/2023 Charlsie Quest, MD     11/02/2023  6:05 AM  Patient Name: Krista Maddox MRN: [id number] Epilepsy Attending: Charlsie Quest Referring Physician/Provider: Rejeana Brock, MD Date: 11/02/2023 Duration: 27.22 mins Patient history: 37 yo F with acute onset of right hemiparesis, decreased sensation to right side. EEG to evaluate for seizure Level of alertness: Awake AEDs during EEG study: TPM Technical aspects: This EEG study was done with scalp electrodes positioned according to the 10-20 International system of electrode placement. Electrical activity was reviewed with band pass filter of 1-70Hz , sensitivity of 7 uV/mm, display speed of 9mm/sec with a 60Hz  notched filter applied as appropriate. EEG data were recorded continuously and digitally stored.  Video monitoring was available and reviewed as appropriate. Description: The posterior dominant rhythm consists of 8 Hz activity of moderate voltage (25-35 uV) seen predominantly in posterior head regions, symmetric and reactive to eye opening and eye closing. EEG showed continuous 3 to 6 Hz theta-delta slowing in right fronto-temporo-parietal region. Hyperventilation and photic stimulation were not performed.   ABNORMALITY - Continuous slow, right fronto-temporo-parietal region IMPRESSION: This study is suggestive of cortical dysfunction arising from right fronto-temporo-parietal region likely secondary to underlying stroke. No seizures or epileptiform discharges were seen throughout the recording. Charlsie Quest   MR BRAIN WO CONTRAST  Result Date: 11/02/2023 CLINICAL DATA:  Stroke suspected, left-sided weakness EXAM: MRI HEAD WITHOUT CONTRAST TECHNIQUE: Multiplanar, multiecho pulse sequences of the brain and surrounding structures were obtained without intravenous contrast. COMPARISON:  03/06/2019 MRI head, 11/01/2023 and 04/05/2021 CT head FINDINGS: Brain: No restricted diffusion to suggest acute or subacute infarct. No acute hemorrhage, mass, mass effect, or midline shift. No  hydrocephalus or extra-axial collection. Pituitary and craniocervical junction within normal limits. Hemosiderin deposition is associated with remote right MCA and posterior left MCA territory infarcts, likely petechial hemorrhage. No other hemosiderin deposition to suggest additional hemorrhage. Ex vacuo dilatation of the right lateral ventricle. Wallerian degeneration in the right cerebral peduncle and midbrain. Vascular: Normal arterial flow voids. Skull and upper cervical spine: Normal marrow signal. Sinuses/Orbits: Clear paranasal sinuses. No acute finding in the orbits. Other: The mastoid air cells are well aerated. IMPRESSION: No acute intracranial process. No evidence of acute or subacute infarct. Electronically Signed   By: Wiliam Ke M.D.   On: 11/02/2023 00:24   CT HEAD CODE STROKE WO CONTRAST`  Result Date: 11/01/2023 CLINICAL DATA:  Left-sided weakness, stroke suspected EXAM: CT HEAD WITHOUT CONTRAST CT ANGIOGRAPHY OF THE HEAD AND NECK TECHNIQUE: Contiguous axial images were obtained from the base of the skull through the vertex without intravenous contrast. Multidetector CT imaging of the head and neck was performed using the standard protocol during bolus administration of intravenous contrast. Multiplanar CT image reconstructions and MIPs were obtained to evaluate the vascular anatomy. Carotid stenosis measurements (when applicable) are obtained utilizing NASCET criteria, using the distal internal carotid diameter as the denominator. RADIATION DOSE REDUCTION: This exam was performed according to the departmental dose-optimization program which includes automated exposure control, adjustment of the mA and/or kV according to patient size and/or use of iterative reconstruction technique. CONTRAST:  75mL OMNIPAQUE IOHEXOL 350 MG/ML SOLN COMPARISON:  04/05/2021 CT head, 04/02/2021 CTA head and neck FINDINGS: CT HEAD Brain: No evidence of acute infarction, hemorrhage, mass, mass effect, or midline  shift. No hydrocephalus or extra-axial collection. Extensive encephalomalacia in the right MCA territory is consistent with evolution the acute infarct seen on 04/05/2021. Additional more remote encephalomalacia in left parietal and temporal lobe appears unchanged. Vascular: No hyperdense vessel. Skull: Negative for fracture or focal lesion. Sinuses/Orbits: No acute finding. Other: The mastoid air cells are well aerated. CTA NECK FINDINGS Aortic arch: Incompletely imaged. Common origin of the brachiocephalic and left common carotid arteries. No significant stenosis of the imaged major arch vessel origins. Right carotid system: No evidence of stenosis, dissection, or occlusion. Left carotid system: No evidence of stenosis, dissection, or occlusion. Vertebral arteries: No evidence of stenosis, dissection, or occlusion. Skeleton: No acute osseous abnormality. Degenerative changes in the cervical spine. Other neck: No acute finding. Upper chest: No focal pulmonary opacity or pleural effusion. Review of the MIP images confirms the above findings CTA HEAD FINDINGS Anterior circulation: Both internal carotid arteries are patent to the termini, without significant stenosis. A1 segments patent. Normal anterior communicating artery. Anterior cerebral arteries are patent to their distal aspects without significant stenosis. Moderate stenosis in the right M1 (series 7, image 104), which is otherwise patent. Right MCA branches are patent to their distal aspects without significant stenosis. No stenosis in left M1 or left MCA branches. Posterior circulation: The right V4 primarily supplies the right PICA. Vertebral arteries patent to the vertebrobasilar junction without significant stenosis. Posterior inferior cerebellar arteries patent proximally. Basilar patent to its distal aspect without significant stenosis. Superior cerebellar arteries patent  proximally. Patent P1 segments. Near fetal origin of the bilateral PCAs from the  posterior communicating arteries. PCAs perfused to their distal aspects without significant stenosis. The bilateral posterior communicating arteries are not visualized. Venous sinuses: As permitted by contrast timing, patent. Anatomic variants: Near fetal origin of the bilateral posterior communicating arteries. No evidence of aneurysm or vascular malformation. Review of the MIP images confirms the above findings IMPRESSION: 1. No acute intracranial process. Extensive encephalomalacia in the right MCA territory is consistent with evolution of the acute infarct seen on 04/05/2021. 2. No intracranial large vessel occlusion. Moderate stenosis in the right M1. 3. No hemodynamically significant stenosis in the neck. Imaging results from the noncontrast CT were communicated on 11/01/2023 at 7:11 pm to provider Dr. Derry Lory via secure text paging. Imaging results from the CTA were communicated on 11/01/2023 at 7:18 pm to provider Dr. Derry Lory via secure text paging. Electronically Signed   By: Wiliam Ke M.D.   On: 11/01/2023 19:18   CT ANGIO HEAD NECK W WO CM (CODE STROKE)  Result Date: 11/01/2023 CLINICAL DATA:  Left-sided weakness, stroke suspected EXAM: CT HEAD WITHOUT CONTRAST CT ANGIOGRAPHY OF THE HEAD AND NECK TECHNIQUE: Contiguous axial images were obtained from the base of the skull through the vertex without intravenous contrast. Multidetector CT imaging of the head and neck was performed using the standard protocol during bolus administration of intravenous contrast. Multiplanar CT image reconstructions and MIPs were obtained to evaluate the vascular anatomy. Carotid stenosis measurements (when applicable) are obtained utilizing NASCET criteria, using the distal internal carotid diameter as the denominator. RADIATION DOSE REDUCTION: This exam was performed according to the departmental dose-optimization program which includes automated exposure control, adjustment of the mA and/or kV according to  patient size and/or use of iterative reconstruction technique. CONTRAST:  75mL OMNIPAQUE IOHEXOL 350 MG/ML SOLN COMPARISON:  04/05/2021 CT head, 04/02/2021 CTA head and neck FINDINGS: CT HEAD Brain: No evidence of acute infarction, hemorrhage, mass, mass effect, or midline shift. No hydrocephalus or extra-axial collection. Extensive encephalomalacia in the right MCA territory is consistent with evolution the acute infarct seen on 04/05/2021. Additional more remote encephalomalacia in left parietal and temporal lobe appears unchanged. Vascular: No hyperdense vessel. Skull: Negative for fracture or focal lesion. Sinuses/Orbits: No acute finding. Other: The mastoid air cells are well aerated. CTA NECK FINDINGS Aortic arch: Incompletely imaged. Common origin of the brachiocephalic and left common carotid arteries. No significant stenosis of the imaged major arch vessel origins. Right carotid system: No evidence of stenosis, dissection, or occlusion. Left carotid system: No evidence of stenosis, dissection, or occlusion. Vertebral arteries: No evidence of stenosis, dissection, or occlusion. Skeleton: No acute osseous abnormality. Degenerative changes in the cervical spine. Other neck: No acute finding. Upper chest: No focal pulmonary opacity or pleural effusion. Review of the MIP images confirms the above findings CTA HEAD FINDINGS Anterior circulation: Both internal carotid arteries are patent to the termini, without significant stenosis. A1 segments patent. Normal anterior communicating artery. Anterior cerebral arteries are patent to their distal aspects without significant stenosis. Moderate stenosis in the right M1 (series 7, image 104), which is otherwise patent. Right MCA branches are patent to their distal aspects without significant stenosis. No stenosis in left M1 or left MCA branches. Posterior circulation: The right V4 primarily supplies the right PICA. Vertebral arteries patent to the vertebrobasilar  junction without significant stenosis. Posterior inferior cerebellar arteries patent proximally. Basilar patent to its distal aspect without significant stenosis. Superior cerebellar arteries patent proximally.  Patent P1 segments. Near fetal origin of the bilateral PCAs from the posterior communicating arteries. PCAs perfused to their distal aspects without significant stenosis. The bilateral posterior communicating arteries are not visualized. Venous sinuses: As permitted by contrast timing, patent. Anatomic variants: Near fetal origin of the bilateral posterior communicating arteries. No evidence of aneurysm or vascular malformation. Review of the MIP images confirms the above findings IMPRESSION: 1. No acute intracranial process. Extensive encephalomalacia in the right MCA territory is consistent with evolution of the acute infarct seen on 04/05/2021. 2. No intracranial large vessel occlusion. Moderate stenosis in the right M1. 3. No hemodynamically significant stenosis in the neck. Imaging results from the noncontrast CT were communicated on 11/01/2023 at 7:11 pm to provider Dr. Derry Lory via secure text paging. Imaging results from the CTA were communicated on 11/01/2023 at 7:18 pm to provider Dr. Derry Lory via secure text paging. Electronically Signed   By: Wiliam Ke M.D.   On: 11/01/2023 19:18    Vitals:   11/02/23 0131 11/02/23 0148 11/02/23 0200 11/02/23 0530  BP:  (!) 133/93 120/89 122/83  Pulse:   84 80  Resp:  16 16 15   Temp:   98.3 F (36.8 C) 98.3 F (36.8 C)  TempSrc:   Oral Oral  SpO2:  100% 95% 97%  Weight: 124 kg     Height: 5\' 5"  (1.651 m)        PHYSICAL EXAM General:  Alert, obese middle-age lady patient in no acute distress Psych:  Mood and affect appropriate for situation CV: Regular rate and rhythm on monitor Respiratory:  Regular, unlabored respirations on room air GI: Abdomen soft and nontender   NEURO:  Mental Status: Patient is nonverbal at baseline.  She  follows 1-step commands that are written on her white board.   Cranial Nerves:  II: PERRL. Blinks to threat bilaterally  III, IV, VI: EOMI. Eyelids elevate symmetrically.  V: Sensation is intact to light touch and symmetrical to face.  VII: Face is symmetrical resting and smiling VIII: hearing intact to voice. IX, X: Palate elevates symmetrically. Phonation is normal.  GN:FAOZHYQM shrug 5/5. XII: tongue is midline without fasciculations. Motor:  LUE 0/5 increased tone and spasticity RUE 4/5 LLE 0/5 increased tone and spasticity RLE 3/5 Tone: is normal and bulk is normal Sensation-  Coordination:Unable to perform  Gait- deferred   ASSESSMENT/PLAN  Recrudescence of prior stroke symptoms  Code Stroke CT head - Extensive encephalomalacia in the right MCA territory is consistent with evolution of the acute infarct seen on 04/05/2021. CTA head & neck - No intracranial large vessel occlusion. Moderate stenosis in the right M1. MRI  No acute intracranial process. No evidence of acute or subacute infarct. EEG- This study is suggestive of cortical dysfunction arising from right fronto-temporo-parietal region likely secondary to underlying stroke. No seizures or epileptiform discharges were seen throughout the recording. 2D Echo EF 60-65% LDL 37 HgbA1c 5.0 VTE prophylaxis - Eliquis  Eliquis (apixaban) daily prior to admission, now on Eliquis (apixaban) daily  Therapy recommendations:  No follow up needed  Disposition:  pending   Hx of Stroke/TIA In 08/2017 patient admitted for left sided numbness and headache.  MRI showed right cerebellum, right temporoparietal small infarcts.  EF 55 to 60%, CT head and neck unremarkable.  LDL 89, A1c 4.8.  TCD bubble study showed trivial PFO.  DVT negative.  TEE no PFO.  Hypercoagulable and autoimmune work-up negative.  Discharged with aspirin and Lipitor. 01/2018 episode of left facial numbness  and left leg weakness.  CT head and neck unremarkable.  MRI  negative. 06/2018 admitted to [facility] for right hand/face numbness.  MRI showed right tiny parietal infarct. 10/2020 admitted to [facility] for aphasia status post tPA.  CTA neck showed left M2 occlusion.  MRI showed left MCA and punctate right frontal infarct.  TCD bubble study potential trivial PFO, TEE tiny PFO. 11/2020 she was referred to loop recorder which was not done as physician thought she should be candidate for anticoagulation.  Started on Eliquis 5 mg twice daily. 04/02/2021 -05/15/2021 - Acute infarct of the R MCA due to right M1 occlusion s/p IR with TICI2c, cryptogenic etiology    Small PFO on chronic anticoagulation with Eliquis    Hypertension Home meds:  atenolol Stable Blood Pressure Goal: BP less than 220/110   Hyperlipidemia Home meds:  atorvastatin 40, resumed in hospital LDL 37, goal < 70 Continue statin at discharge  Dysphagia Patient has chronic dysphagia, PEG tube in place    Diet   Diet NPO time specified   Advance diet as tolerated  Other Stroke Risk Factors ETOH use, alcohol level <10, advised to drink no more than 1 drink(s) a day Obesity, Body mass index is 45.49 kg/m., BMI >/= 30 associated with increased stroke risk, recommend weight loss, diet and exercise as appropriate  Depression Effexor 150mg  BID Hx of pseudotumor cerebri; migraines Follows with outpatient neurology Topamax     Hospital day # 0  Patient seen and examined by NP/APP with MD. MD to update note as needed.   Elmer Picker, DNP, FNP-BC [facility] Pager: [phone]  I have personally obtained history,examined this patient, reviewed notes, independently viewed imaging studies, participated in medical decision making and plan of care.ROS completed by me personally and pertinent positives fully documented  I have made any additions or clarifications directly to the above note. Agree with note above.  Patient with history of multiple strokes and residual spastic left  hemiplegia, anarthria and bedridden state presents with worsening of left-sided weakness and pain.  Neuroimaging is negative for acute stroke.  No obvious reversible etiology for worsening of deficits noted.  Continue Eliquis maintain aggressive risk factor modification.  No further stroke related testing is needed.  Long discussion with patient and mother and answered questions.  Discussed with Dr. Sharl Ma greater than 50% time during this 35-minute visit (on counseling and coordination of care about her stroke and weakness and discussion with patient and care team and answering questions. Stroke team will sign off.  Kindly call for questions Delia Heady, MD Medical Director [facility] Pager: [phone] 11/02/2023 5:14 PM   To contact Stroke Continuity provider, please refer to [email or web address]. After hours, contact General Neurology

## 2023-11-03 DIAGNOSIS — R531 Weakness: Secondary | ICD-10-CM | POA: Diagnosis not present

## 2023-11-03 MED ORDER — BUSPIRONE HCL 10 MG PO TABS
5.0000 mg | ORAL_TABLET | Freq: Two times a day (BID) | ORAL | Status: DC
  Administered 2023-11-03 – 2023-11-04 (×3): 5 mg via ORAL
  Filled 2023-11-03 (×4): qty 1

## 2023-11-03 MED ORDER — GABAPENTIN 300 MG PO CAPS
300.0000 mg | ORAL_CAPSULE | Freq: Two times a day (BID) | ORAL | Status: DC
  Administered 2023-11-03 – 2023-11-04 (×3): 300 mg via ORAL
  Filled 2023-11-03 (×4): qty 1

## 2023-11-03 MED ORDER — OLANZAPINE 2.5 MG PO TABS
2.5000 mg | ORAL_TABLET | Freq: Every day | ORAL | Status: DC
  Administered 2023-11-03 – 2023-11-04 (×2): 2.5 mg via ORAL
  Filled 2023-11-03 (×2): qty 1

## 2023-11-03 MED ORDER — VENLAFAXINE HCL 75 MG PO TABS
150.0000 mg | ORAL_TABLET | Freq: Two times a day (BID) | ORAL | Status: DC
  Administered 2023-11-03 – 2023-11-04 (×3): 150 mg via ORAL
  Filled 2023-11-03 (×4): qty 2

## 2023-11-03 NOTE — Progress Notes (Signed)
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  She is maintained on Eliquis for her recurrent strokes. CT head shows Extensive encephalomalacia in the right MCA territory is consistent with evolution of the acute infarct seen on 04/05/2021.  Neurology was consulted EEG ordered    Assessment/Plan:   Right upper extremity numbness in setting of history of cryptogenic strokes -Neurology consulted -Stroke workup initiated; neuroimaging was negative for acute stroke.  No obvious reversible etiology noted for worsening deficits. -EEG obtained showed right frontotemporoparietal cortical dysfunction, likely from underlying stroke -Neurology recommended to continue with Eliquis, no further stroke related testing needed.   Migraine/headache -Continue Topamax  Anxiety -Resume BuSpar, Effexor   Medications     apixaban  5 mg Oral BID   atorvastatin  40 mg Oral Daily   ezetimibe  10 mg Oral Daily   topiramate  150 mg Oral BID     Data Reviewed:   CBG:  Recent Labs  Lab 11/01/23 1844  GLUCAP 90    SpO2: 100 %    Vitals:   11/02/23 2005 11/02/23 2359 11/03/23 0420 11/03/23 0743  BP: (!) 125/95 (!) 143/87 (!) 118/92 (!) 137/100  Pulse: 90 86 77 (!) 101  Resp: 17 17 19 18   Temp: 99.2 F (37.3 C) 98.8 F (37.1 C) 98.7 F (37.1 C) 98 F (36.7 C)  TempSrc: Oral Oral Oral Oral  SpO2: (!) 86% 96% (!) 83% 100%  Weight:      Height:          Data Reviewed:  Basic Metabolic Panel: Recent Labs  Lab 11/01/23 1845 11/01/23 1849 11/02/23 0312  NA 141 143 140  K 3.8 3.8 3.7  CL 111 108 111  CO2 23  --  22  GLUCOSE 93 90 83  BUN 13 13 11   CREATININE 0.83 0.90 0.75  CALCIUM 8.5*  --  8.4*    CBC: Recent Labs  Lab 11/01/23 1845 11/01/23 1849 11/02/23 0312  WBC  8.0  --  8.6  NEUTROABS 4.1  --   --   HGB 10.0* 11.6* 9.5*  HCT 34.9* 34.0* 31.7*  MCV 82.7  --  80.7  PLT 239  --  220    LFT Recent Labs  Lab 11/01/23 1845 11/02/23 0312  AST 15 19  ALT 13 13  ALKPHOS 77 73  BILITOT 0.4 0.7  PROT 7.0 6.8  ALBUMIN 3.1* 3.0*     Antibiotics: Anti-infectives (From admission, onward)    None        DVT prophylaxis: Apixaban  Code Status: Full code  Family Communication: No family at bedside   CONSULTS neurology   Subjective   Nonverbal, wanted [place] to call home and talk to her mother.  TOC has been trying to call patient's family but unable to contact them.   Objective    Physical Examination:   General-appears in no acute distress Heart-S1-S2, regular, no murmur auscultated Lungs-clear to auscultation bilaterally, no wheezing or crackles auscultated Abdomen-soft, nontender, no organomegaly Extremities-no edema in the lower extremities Neuro-alert, nonverbal, following commands, left hemiparesis  Status is: Inpatient:             Meredeth Ide   [facility] If 7PM-7AM, please contact night-coverage at [email or web address], Office  [phone]   11/03/2023, 10:00 AM  LOS: 1  day

## 2023-11-03 NOTE — Progress Notes (Signed)
SLP Cancellation Note  Patient Details Name: Krista Maddox MRN: [id number] DOB: 1986/03/08   Cancelled treatment:       Reason Eval/Treat Not Completed: SLP screened. Pt reports no change in cognitive linguistic ability. No needs identified, will sign off acutely. Recommend f/u with SLP at next venue of care to maximize functional communication.    Gwynneth Aliment, M.A., CF-SLP Speech Language Pathology, Acute Rehabilitation Services  Secure Chat preferred [phone]  11/03/2023, 4:04 PM

## 2023-11-03 NOTE — Plan of Care (Signed)
  Problem: Ischemic Stroke/TIA Tissue Perfusion: Goal: Complications of ischemic stroke/TIA will be minimized Outcome: Progressing   Problem: Coping: Goal: Will identify appropriate support needs Outcome: Progressing   Problem: Self-Care: Goal: Verbalization of feelings and concerns over difficulty with self-care will improve Outcome: Progressing   Problem: Nutrition: Goal: Risk of aspiration will decrease Outcome: Progressing   Problem: Health Behavior/Discharge Planning: Goal: Ability to manage health-related needs will improve Outcome: Progressing   Problem: Nutrition: Goal: Adequate nutrition will be maintained Outcome: Progressing   Problem: Elimination: Goal: Will not experience complications related to urinary retention Outcome: Progressing

## 2023-11-03 NOTE — Progress Notes (Signed)
Transition of Care [facility]) - Inpatient Brief Assessment   Patient Details  Name: Krista Maddox MRN: [id number] Date of Birth: [birth year]  Transition of Care [facility]) CM/SW Contact:    Janae Bridgeman, RN Phone Number: 11/03/2023, 3:48 PM   Clinical Narrative: CM called and spoke with the patient's mother by phone and the patient has been living with the mother and father in the home, who provide 24 hour care since the patient was removed by the nursing home by the family.  The patient's mother states that the patient has been in a hospital bed at the home but was working with PT in the home through [facility] just recently.  SHe is interested in having home health with [facility] restart services again to see if patient can make some slow progression.  The patient's mother does not want her daughter in a nursing home but is concerned about when she herself is unable to take care of her in the future.  MSW HH will be added to [facility] to assist family with community resources and possible placement in the future.   HH orders for PT and MSW will be added to be co-signed by the MD. Patient has personal care hours through Medicaid at this time through [facility].  Patient will need ambulance transport home when medically stable for discharge.   Transition of Care Asessment: Insurance and Status: (P) Insurance coverage has been reviewed Patient has primary care physician: (P) Yes Home environment has been reviewed: (P) from home with family Prior level of function:: (P) bedbound at home with PCS Services through [facility] Prior/Current Home Services: (P) Current home services ([facility] Services through [facility]) Social Determinants of Health Reivew: (P) SDOH reviewed needs interventions Readmission risk has been reviewed: (P) Yes Transition of care needs: (P) transition of care needs identified, TOC will continue to follow

## 2023-11-03 NOTE — Progress Notes (Signed)
CM has left a voicemail for her mother. TOC following.

## 2023-11-04 DIAGNOSIS — R202 Paresthesia of skin: Secondary | ICD-10-CM | POA: Diagnosis not present

## 2023-11-04 DIAGNOSIS — R531 Weakness: Secondary | ICD-10-CM | POA: Diagnosis not present

## 2023-11-04 NOTE — TOC Transition Note (Addendum)
Transition of Care [facility]) - CM/SW Discharge Note   Patient Details  Name: Krista Maddox MRN: [id number] Date of Birth: [birth year]  Transition of Care [facility]) CM/SW Contact:  Janae Bridgeman, RN Phone Number: 11/04/2023, 12:22 PM   Clinical Narrative:    CM called and spoke with the patient's mother, Krista Maddox by phone and she is aware that patient is being discharged home today and requests PTAr home after 5 pm.  PTAR will be set up for 5 pm and packet left at the nursing station.  The patient's mother wanted a medical update from the attending MD - Dr. Sharl Ma was provided with the patient's mother's number.  The patient's mother is aware that [facility] will follow the patient for PT, MSW in the community to assist with community resources.  Patient was recently at a nursing home in [place] and the mother requested discharge home since the facility was too far away and she was not happy with the care.  I explained to the mother that LTC placement is difficult due to patient's age and that local placement would not likely happen but that MSW with [facility] could assist the mom for future placement if at all possible.  PTAR will be set up for 5 pm today at patient's mother's request.  Patient has services with [facility] for personal care services and [facility] plans to follow up in the home for Pt, MSW.  Bedside nursing - please call report to the patient's mother by phone.         Patient Goals and CMS Choice      Discharge Placement                         Discharge Plan and Services Additional resources added to the After Visit Summary for                                       Social Determinants of Health (SDOH) Interventions SDOH Screenings   Food Insecurity: Patient Declined (11/03/2023)  Transportation Needs: No Transportation Needs (03/04/2023)   Received from [facility], [facility]  Utilities: Not At Risk (03/04/2023)   Received from [facility],  [facility]  Financial Resource Strain: Medium Risk (03/04/2023)   Received from [facility], [facility]  Physical Activity: Unknown (03/04/2023)   Received from [facility], [facility]  Social Connections: Socially Integrated (03/04/2023)   Received from [facility], [facility]  Stress: No Stress Concern Present (03/04/2023)   Received from [facility], [facility]  Tobacco Use: Medium Risk (09/30/2023)   Received from [facility]     Readmission Risk Interventions    11/03/2023    3:44 PM  Readmission Risk Prevention Plan  Post Dischage Appt Complete  Medication Screening Complete  Transportation Screening Complete

## 2023-11-04 NOTE — Discharge Summary (Addendum)
Physician Discharge Summary   Patient: Krista Maddox MRN: [id number] DOB: 05/05/1986  Admit date:     11/01/2023  Discharge date: 11/04/23  Discharge Physician: Meredeth Ide   PCP: Maud Deed, PA   Recommendations at discharge:   Follow-up PCP in 1 week  Discharge Diagnoses: Principal Problem:   Acute left-sided weakness Active Problems:   Paresthesia   Stroke [facility])  Resolved Problems:   * No resolved hospital problems. 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  She is maintained on Eliquis for her recurrent strokes. CT head shows Extensive encephalomalacia in the right MCA territory is consistent with evolution of the acute infarct seen on 04/05/2021.  Neurology was consulted EEG ordered    Assessment and Plan:  Right upper extremity numbness in setting of history of cryptogenic strokes -Neurology consulted -Stroke workup initiated; neuroimaging was negative for acute stroke.  No obvious reversible etiology noted for worsening deficits. -EEG obtained showed right frontotemporoparietal cortical dysfunction, likely from underlying stroke -Neurology recommended to continue with Eliquis, no further stroke related testing needed. -Will discharge home   Migraine/headache -Continue Topamax   Anxiety -Resume BuSpar, Effexor       Consultants: Neurology Procedures performed: EEG Disposition: Home Diet recommendation:  Discharge Diet Orders (From admission, onward)     Start     Ordered   11/04/23 0000  Diet - low sodium heart healthy        11/04/23 1138           Regular diet DISCHARGE MEDICATION: Allergies as of 11/04/2023       Reactions   Tape Rash   Cannot tolerate paper tape        Medication List     STOP taking these medications    cyclobenzaprine 10 MG tablet Commonly known as: FLEXERIL   feeding supplement (OSMOLITE 1.5 CAL) Liqd    feeding supplement (PROSource TF) liquid   free water Soln       TAKE these medications    acetaminophen 650 MG CR tablet Commonly known as: TYLENOL Take 1,300 mg by mouth 2 (two) times daily.   apixaban 5 MG Tabs tablet Commonly known as: ELIQUIS Take 5 mg by mouth 2 (two) times daily.   atenolol 25 MG tablet Commonly known as: TENORMIN Take 1 tablet (25 mg total) by mouth daily.   atorvastatin 40 MG tablet Commonly known as: LIPITOR Take 40 mg by mouth at bedtime.   busPIRone 5 MG tablet Commonly known as: BUSPAR Take 5 mg by mouth 2 (two) times daily.   calcium carbonate 500 MG chewable tablet Commonly known as: TUMS - dosed in mg elemental calcium Chew 2 tablets by mouth as needed for indigestion or heartburn.   ezetimibe 10 MG tablet Commonly known as: ZETIA Place 1 tablet (10 mg total) into feeding tube daily. What changed:  how to take this when to take this   gabapentin 300 MG capsule Commonly known as: NEURONTIN Take 300 mg by mouth 2 (two) times daily.   MIDOL COMPLETE PO Take 2 tablets by mouth 2 (two) times daily as needed (Pain).   OLANZapine 2.5 MG tablet Commonly known as: ZYPREXA Take 2.5 mg by mouth at bedtime.   STOOL SOFTENER PO Take 1 capsule by mouth daily.   topiramate 50 MG tablet Commonly known as: TOPAMAX Place 3 tablets (150 mg total) into feeding tube 2 (two) times daily. What changed: how  to take this   venlafaxine 100 MG tablet Commonly known as: EFFEXOR Take 150 mg by mouth 2 (two) times daily.        Follow-up Information     [place], [facility] Follow up.   Why: [facility] will provide home health services.  They will call you in the next 24-48 hours to set up services. Contact information: [address] [place] [address] [phone]                Discharge Exam: Ceasar Mons Weights   11/01/23 1800 11/02/23 0131  Weight: 124.4 kg 124 kg   General-appears in no  acute distress Heart-S1-S2, regular, no murmur auscultated Lungs-clear to auscultation bilaterally, no wheezing or crackles auscultated Abdomen-soft, nontender, no organomegaly Extremities-no edema in the lower extremities Neuro-alert, oriented x3, no focal deficit noted  Condition at discharge: good  The results of significant diagnostics from this hospitalization (including imaging, microbiology, ancillary and laboratory) are listed below for reference.   Imaging Studies: ECHOCARDIOGRAM COMPLETE  Result Date: 11/02/2023    ECHOCARDIOGRAM REPORT   Patient Name:   Krista Maddox Date of Exam: 11/02/2023 Medical Rec #:  [id number]          Height:       65.0 in Accession #:    [id number]         Weight:       273.4 lb Date of Birth:  [birth year]          BSA:          2.259 m Patient Age:    37 years           BP:           122/82 mmHg Patient Gender: F                  HR:           100 bpm. Exam Location:  Inpatient Procedure: 2D Echo, Cardiac Doppler and Color Doppler Indications:    TIA  History:        Patient has prior history of Echocardiogram examinations, most                 recent 04/03/2021. Risk Factors:Former Smoker.  Sonographer:    Karma Ganja Referring Phys: [phone] DEBBY CROSLEY IMPRESSIONS  1. Left ventricular ejection fraction, by estimation, is 60 to 65%. The left ventricle has normal function. The left ventricle has no regional wall motion abnormalities. Left ventricular diastolic parameters were normal.  2. Right ventricular systolic function is normal. The right ventricular size is normal.  3. The mitral valve is normal in structure. No evidence of mitral valve regurgitation. No evidence of mitral stenosis.  4. The aortic valve is normal in structure. Aortic valve regurgitation is not visualized. No aortic stenosis is present.  5. The inferior vena cava is normal in size with greater than 50% respiratory variability, suggesting right atrial pressure of 3 mmHg. FINDINGS  Left  Ventricle: Left ventricular ejection fraction, by estimation, is 60 to 65%. The left ventricle has normal function. The left ventricle has no regional wall motion abnormalities. The left ventricular internal cavity size was normal in size. There is  no left ventricular hypertrophy. Left ventricular diastolic parameters were normal. Right Ventricle: The right ventricular size is normal. No increase in right ventricular wall thickness. Right ventricular systolic function is normal. Left Atrium: Left atrial size was normal in size. Right Atrium: Right  atrial size was normal in size. Pericardium: There is no evidence of pericardial effusion. Mitral Valve: The mitral valve is normal in structure. No evidence of mitral valve regurgitation. No evidence of mitral valve stenosis. Tricuspid Valve: The tricuspid valve is normal in structure. Tricuspid valve regurgitation is not demonstrated. No evidence of tricuspid stenosis. Aortic Valve: The aortic valve is normal in structure. Aortic valve regurgitation is not visualized. No aortic stenosis is present. Aortic valve mean gradient measures 4.0 mmHg. Aortic valve peak gradient measures 6.7 mmHg. Aortic valve area, by VTI measures 2.06 cm. Pulmonic Valve: The pulmonic valve was normal in structure. Pulmonic valve regurgitation is not visualized. No evidence of pulmonic stenosis. Aorta: The aortic root is normal in size and structure. Venous: The inferior vena cava is normal in size with greater than 50% respiratory variability, suggesting right atrial pressure of 3 mmHg. IAS/Shunts: No atrial level shunt detected by color flow Doppler.  LEFT VENTRICLE PLAX 2D LVIDd:         3.90 cm   Diastology LVIDs:         2.70 cm   LV e' medial:    13.20 cm/s LV PW:         0.90 cm   LV E/e' medial:  7.7 LV IVS:        0.90 cm   LV e' lateral:   14.90 cm/s LVOT diam:     1.80 cm   LV E/e' lateral: 6.8 LV SV:         47 LV SV Index:   21 LVOT Area:     2.54 cm  RIGHT VENTRICLE              IVC RV Basal diam:  3.10 cm     IVC diam: 1.40 cm RV S prime:     14.90 cm/s TAPSE (M-mode): 2.6 cm LEFT ATRIUM             Index        RIGHT ATRIUM          Index LA diam:        2.90 cm 1.28 cm/m   RA Area:     8.03 cm LA Vol (A2C):   29.9 ml 13.24 ml/m  RA Volume:   12.30 ml 5.44 ml/m LA Vol (A4C):   34.3 ml 15.18 ml/m LA Biplane Vol: 33.3 ml 14.74 ml/m  AORTIC VALVE AV Area (Vmax):    1.93 cm AV Area (Vmean):   1.88 cm AV Area (VTI):     2.06 cm AV Vmax:           129.00 cm/s AV Vmean:          89.300 cm/s AV VTI:            0.228 m AV Peak Grad:      6.7 mmHg AV Mean Grad:      4.0 mmHg LVOT Vmax:         98.00 cm/s LVOT Vmean:        65.800 cm/s LVOT VTI:          0.185 m LVOT/AV VTI ratio: 0.81  AORTA Ao Root diam: 2.90 cm Ao Asc diam:  2.70 cm MITRAL VALVE MV Area (PHT): 5.13 cm     SHUNTS MV Decel Time: 148 msec     Systemic VTI:  0.18 m MV E velocity: 102.00 cm/s  Systemic Diam: 1.80 cm MV A velocity: 113.00 cm/s MV E/A ratio:  0.90  Arvilla Meres MD Electronically signed by Arvilla Meres MD Signature Date/Time: 11/02/2023/9:39:06 AM    Final    EEG adult  Result Date: 11/02/2023 Charlsie Quest, MD     11/02/2023  6:05 AM Patient Name: Krista Maddox MRN: [id number] Epilepsy Attending: Charlsie Quest Referring Physician/Provider: Rejeana Brock, MD Date: 11/02/2023 Duration: 27.22 mins Patient history: 37 yo F with acute onset of right hemiparesis, decreased sensation to right side. EEG to evaluate for seizure Level of alertness: Awake AEDs during EEG study: TPM Technical aspects: This EEG study was done with scalp electrodes positioned according to the 10-20 International system of electrode placement. Electrical activity was reviewed with band pass filter of 1-70Hz , sensitivity of 7 uV/mm, display speed of 39mm/sec with a 60Hz  notched filter applied as appropriate. EEG data were recorded continuously and digitally stored.  Video monitoring was available and reviewed  as appropriate. Description: The posterior dominant rhythm consists of 8 Hz activity of moderate voltage (25-35 uV) seen predominantly in posterior head regions, symmetric and reactive to eye opening and eye closing. EEG showed continuous 3 to 6 Hz theta-delta slowing in right fronto-temporo-parietal region. Hyperventilation and photic stimulation were not performed.   ABNORMALITY - Continuous slow, right fronto-temporo-parietal region IMPRESSION: This study is suggestive of cortical dysfunction arising from right fronto-temporo-parietal region likely secondary to underlying stroke. No seizures or epileptiform discharges were seen throughout the recording. Charlsie Quest   MR BRAIN WO CONTRAST  Result Date: 11/02/2023 CLINICAL DATA:  Stroke suspected, left-sided weakness EXAM: MRI HEAD WITHOUT CONTRAST TECHNIQUE: Multiplanar, multiecho pulse sequences of the brain and surrounding structures were obtained without intravenous contrast. COMPARISON:  03/06/2019 MRI head, 11/01/2023 and 04/05/2021 CT head FINDINGS: Brain: No restricted diffusion to suggest acute or subacute infarct. No acute hemorrhage, mass, mass effect, or midline shift. No hydrocephalus or extra-axial collection. Pituitary and craniocervical junction within normal limits. Hemosiderin deposition is associated with remote right MCA and posterior left MCA territory infarcts, likely petechial hemorrhage. No other hemosiderin deposition to suggest additional hemorrhage. Ex vacuo dilatation of the right lateral ventricle. Wallerian degeneration in the right cerebral peduncle and midbrain. Vascular: Normal arterial flow voids. Skull and upper cervical spine: Normal marrow signal. Sinuses/Orbits: Clear paranasal sinuses. No acute finding in the orbits. Other: The mastoid air cells are well aerated. IMPRESSION: No acute intracranial process. No evidence of acute or subacute infarct. Electronically Signed   By: Wiliam Ke M.D.   On: 11/02/2023 00:24    CT HEAD CODE STROKE WO CONTRAST`  Result Date: 11/01/2023 CLINICAL DATA:  Left-sided weakness, stroke suspected EXAM: CT HEAD WITHOUT CONTRAST CT ANGIOGRAPHY OF THE HEAD AND NECK TECHNIQUE: Contiguous axial images were obtained from the base of the skull through the vertex without intravenous contrast. Multidetector CT imaging of the head and neck was performed using the standard protocol during bolus administration of intravenous contrast. Multiplanar CT image reconstructions and MIPs were obtained to evaluate the vascular anatomy. Carotid stenosis measurements (when applicable) are obtained utilizing NASCET criteria, using the distal internal carotid diameter as the denominator. RADIATION DOSE REDUCTION: This exam was performed according to the departmental dose-optimization program which includes automated exposure control, adjustment of the mA and/or kV according to patient size and/or use of iterative reconstruction technique. CONTRAST:  75mL OMNIPAQUE IOHEXOL 350 MG/ML SOLN COMPARISON:  04/05/2021 CT head, 04/02/2021 CTA head and neck FINDINGS: CT HEAD Brain: No evidence of acute infarction, hemorrhage, mass, mass effect, or midline shift. No hydrocephalus or extra-axial collection. Extensive  encephalomalacia in the right MCA territory is consistent with evolution the acute infarct seen on 04/05/2021. Additional more remote encephalomalacia in left parietal and temporal lobe appears unchanged. Vascular: No hyperdense vessel. Skull: Negative for fracture or focal lesion. Sinuses/Orbits: No acute finding. Other: The mastoid air cells are well aerated. CTA NECK FINDINGS Aortic arch: Incompletely imaged. Common origin of the brachiocephalic and left common carotid arteries. No significant stenosis of the imaged major arch vessel origins. Right carotid system: No evidence of stenosis, dissection, or occlusion. Left carotid system: No evidence of stenosis, dissection, or occlusion. Vertebral arteries: No  evidence of stenosis, dissection, or occlusion. Skeleton: No acute osseous abnormality. Degenerative changes in the cervical spine. Other neck: No acute finding. Upper chest: No focal pulmonary opacity or pleural effusion. Review of the MIP images confirms the above findings CTA HEAD FINDINGS Anterior circulation: Both internal carotid arteries are patent to the termini, without significant stenosis. A1 segments patent. Normal anterior communicating artery. Anterior cerebral arteries are patent to their distal aspects without significant stenosis. Moderate stenosis in the right M1 (series 7, image 104), which is otherwise patent. Right MCA branches are patent to their distal aspects without significant stenosis. No stenosis in left M1 or left MCA branches. Posterior circulation: The right V4 primarily supplies the right PICA. Vertebral arteries patent to the vertebrobasilar junction without significant stenosis. Posterior inferior cerebellar arteries patent proximally. Basilar patent to its distal aspect without significant stenosis. Superior cerebellar arteries patent proximally. Patent P1 segments. Near fetal origin of the bilateral PCAs from the posterior communicating arteries. PCAs perfused to their distal aspects without significant stenosis. The bilateral posterior communicating arteries are not visualized. Venous sinuses: As permitted by contrast timing, patent. Anatomic variants: Near fetal origin of the bilateral posterior communicating arteries. No evidence of aneurysm or vascular malformation. Review of the MIP images confirms the above findings IMPRESSION: 1. No acute intracranial process. Extensive encephalomalacia in the right MCA territory is consistent with evolution of the acute infarct seen on 04/05/2021. 2. No intracranial large vessel occlusion. Moderate stenosis in the right M1. 3. No hemodynamically significant stenosis in the neck. Imaging results from the noncontrast CT were communicated on  11/01/2023 at 7:11 pm to provider Dr. Derry Lory via secure text paging. Imaging results from the CTA were communicated on 11/01/2023 at 7:18 pm to provider Dr. Derry Lory via secure text paging. Electronically Signed   By: Wiliam Ke M.D.   On: 11/01/2023 19:18   CT ANGIO HEAD NECK W WO CM (CODE STROKE)  Result Date: 11/01/2023 CLINICAL DATA:  Left-sided weakness, stroke suspected EXAM: CT HEAD WITHOUT CONTRAST CT ANGIOGRAPHY OF THE HEAD AND NECK TECHNIQUE: Contiguous axial images were obtained from the base of the skull through the vertex without intravenous contrast. Multidetector CT imaging of the head and neck was performed using the standard protocol during bolus administration of intravenous contrast. Multiplanar CT image reconstructions and MIPs were obtained to evaluate the vascular anatomy. Carotid stenosis measurements (when applicable) are obtained utilizing NASCET criteria, using the distal internal carotid diameter as the denominator. RADIATION DOSE REDUCTION: This exam was performed according to the departmental dose-optimization program which includes automated exposure control, adjustment of the mA and/or kV according to patient size and/or use of iterative reconstruction technique. CONTRAST:  75mL OMNIPAQUE IOHEXOL 350 MG/ML SOLN COMPARISON:  04/05/2021 CT head, 04/02/2021 CTA head and neck FINDINGS: CT HEAD Brain: No evidence of acute infarction, hemorrhage, mass, mass effect, or midline shift. No hydrocephalus or extra-axial collection. Extensive encephalomalacia in  the right MCA territory is consistent with evolution the acute infarct seen on 04/05/2021. Additional more remote encephalomalacia in left parietal and temporal lobe appears unchanged. Vascular: No hyperdense vessel. Skull: Negative for fracture or focal lesion. Sinuses/Orbits: No acute finding. Other: The mastoid air cells are well aerated. CTA NECK FINDINGS Aortic arch: Incompletely imaged. Common origin of the  brachiocephalic and left common carotid arteries. No significant stenosis of the imaged major arch vessel origins. Right carotid system: No evidence of stenosis, dissection, or occlusion. Left carotid system: No evidence of stenosis, dissection, or occlusion. Vertebral arteries: No evidence of stenosis, dissection, or occlusion. Skeleton: No acute osseous abnormality. Degenerative changes in the cervical spine. Other neck: No acute finding. Upper chest: No focal pulmonary opacity or pleural effusion. Review of the MIP images confirms the above findings CTA HEAD FINDINGS Anterior circulation: Both internal carotid arteries are patent to the termini, without significant stenosis. A1 segments patent. Normal anterior communicating artery. Anterior cerebral arteries are patent to their distal aspects without significant stenosis. Moderate stenosis in the right M1 (series 7, image 104), which is otherwise patent. Right MCA branches are patent to their distal aspects without significant stenosis. No stenosis in left M1 or left MCA branches. Posterior circulation: The right V4 primarily supplies the right PICA. Vertebral arteries patent to the vertebrobasilar junction without significant stenosis. Posterior inferior cerebellar arteries patent proximally. Basilar patent to its distal aspect without significant stenosis. Superior cerebellar arteries patent proximally. Patent P1 segments. Near fetal origin of the bilateral PCAs from the posterior communicating arteries. PCAs perfused to their distal aspects without significant stenosis. The bilateral posterior communicating arteries are not visualized. Venous sinuses: As permitted by contrast timing, patent. Anatomic variants: Near fetal origin of the bilateral posterior communicating arteries. No evidence of aneurysm or vascular malformation. Review of the MIP images confirms the above findings IMPRESSION: 1. No acute intracranial process. Extensive encephalomalacia in the  right MCA territory is consistent with evolution of the acute infarct seen on 04/05/2021. 2. No intracranial large vessel occlusion. Moderate stenosis in the right M1. 3. No hemodynamically significant stenosis in the neck. Imaging results from the noncontrast CT were communicated on 11/01/2023 at 7:11 pm to provider Dr. Derry Lory via secure text paging. Imaging results from the CTA were communicated on 11/01/2023 at 7:18 pm to provider Dr. Derry Lory via secure text paging. Electronically Signed   By: Wiliam Ke M.D.   On: 11/01/2023 19:18    Microbiology: Results for orders placed or performed during the hospital encounter of 04/02/21  Resp Panel by RT-PCR (Flu A&B, Covid) Nasopharyngeal Swab     Status: None   Collection Time: 04/02/21 12:24 PM   Specimen: Nasopharyngeal Swab; Nasopharyngeal(NP) swabs in vial transport medium  Result Value Ref Range Status   SARS Coronavirus 2 by RT PCR NEGATIVE NEGATIVE Final    Comment: (NOTE) SARS-CoV-2 target nucleic acids are NOT DETECTED.  The SARS-CoV-2 RNA is generally detectable in upper respiratory specimens during the acute phase of infection. The lowest concentration of SARS-CoV-2 viral copies this assay can detect is 138 copies/mL. A negative result does not preclude SARS-Cov-2 infection and should not be used as the sole basis for treatment or other patient management decisions. A negative result may occur with  improper specimen collection/handling, submission of specimen other than nasopharyngeal swab, presence of viral mutation(s) within the areas targeted by this assay, and inadequate number of viral copies(<138 copies/mL). A negative result must be combined with clinical observations, patient history, and epidemiological  information. The expected result is Negative.  Fact Sheet for Patients:  BloggerCourse.com  Fact Sheet for Healthcare Providers:  SeriousBroker.it  This test is  no t yet approved or cleared by the [place] FDA and  has been authorized for detection and/or diagnosis of SARS-CoV-2 by FDA under an Emergency Use Authorization (EUA). This EUA will remain  in effect (meaning this test can be used) for the duration of the COVID-19 declaration under Section 564(b)(1) of the Act, 21 U.S.C.section 360bbb-3(b)(1), unless the authorization is terminated  or revoked sooner.       Influenza A by PCR NEGATIVE NEGATIVE Final   Influenza B by PCR NEGATIVE NEGATIVE Final    Comment: (NOTE) The Xpert Xpress SARS-CoV-2/FLU/RSV plus assay is intended as an aid in the diagnosis of influenza from Nasopharyngeal swab specimens and should not be used as a sole basis for treatment. Nasal washings and aspirates are unacceptable for Xpert Xpress SARS-CoV-2/FLU/RSV testing.  Fact Sheet for Patients: BloggerCourse.com  Fact Sheet for Healthcare Providers: SeriousBroker.it  This test is not yet approved or cleared by the [place] FDA and has been authorized for detection and/or diagnosis of SARS-CoV-2 by FDA under an Emergency Use Authorization (EUA). This EUA will remain in effect (meaning this test can be used) for the duration of the COVID-19 declaration under Section 564(b)(1) of the Act, 21 U.S.C. section 360bbb-3(b)(1), unless the authorization is terminated or revoked.  Performed at [facility], [address] [place], [place] [address]   MRSA PCR Screening     Status: None   Collection Time: 04/02/21  3:28 PM   Specimen: Nasal Mucosa; Nasopharyngeal  Result Value Ref Range Status   MRSA by PCR NEGATIVE NEGATIVE Final    Comment:        The GeneXpert MRSA Assay (FDA approved for NASAL specimens only), is one component of a comprehensive MRSA colonization surveillance program. It is not intended to diagnose MRSA infection nor to guide or monitor treatment for MRSA  infections. Performed at [facility], [address] [place], [place] [address]   SARS CORONAVIRUS 2 (TAT 6-24 HRS) Nasopharyngeal Nasopharyngeal Swab     Status: None   Collection Time: 04/21/21  7:26 PM   Specimen: Nasopharyngeal Swab  Result Value Ref Range Status   SARS Coronavirus 2 NEGATIVE NEGATIVE Final    Comment: (NOTE) SARS-CoV-2 target nucleic acids are NOT DETECTED.  The SARS-CoV-2 RNA is generally detectable in upper and lower respiratory specimens during the acute phase of infection. Negative results do not preclude SARS-CoV-2 infection, do not rule out co-infections with other pathogens, and should not be used as the sole basis for treatment or other patient management decisions. Negative results must be combined with clinical observations, patient history, and epidemiological information. The expected result is Negative.  Fact Sheet for Patients: HairSlick.no  Fact Sheet for Healthcare Providers: [email or web address]  This test is not yet approved or cleared by the [place] FDA and  has been authorized for detection and/or diagnosis of SARS-CoV-2 by FDA under an Emergency Use Authorization (EUA). This EUA will remain  in effect (meaning this test can be used) for the duration of the COVID-19 declaration under Se ction 564(b)(1) of the Act, 21 U.S.C. section 360bbb-3(b)(1), unless the authorization is terminated or revoked sooner.  Performed at [facility], [address], [place], [place] [address]   Aerobic Culture w Gram Stain (superficial specimen)     Status: None   Collection  Time: 04/23/21 11:53 AM   Specimen: Wound  Result Value Ref Range Status   Specimen Description WOUND ABDOMEN  Final   Special Requests NONE  Final   Gram Stain   Final    ABUNDANT WBC PRESENT,BOTH PMN AND MONONUCLEAR FEW GRAM POSITIVE COCCI FEW GRAM NEGATIVE RODS RARE GRAM VARIABLE ROD Performed at [facility], [address] [place], [place] [address]    Culture RARE STAPHYLOCOCCUS AUREUS  Final   Report Status 04/25/2021 FINAL  Final   Organism ID, Bacteria STAPHYLOCOCCUS AUREUS  Final      Susceptibility   Staphylococcus aureus - MIC*    CIPROFLOXACIN <=0.5 SENSITIVE Sensitive     ERYTHROMYCIN <=0.25 SENSITIVE Sensitive     GENTAMICIN <=0.5 SENSITIVE Sensitive     OXACILLIN <=0.25 SENSITIVE Sensitive     TETRACYCLINE <=1 SENSITIVE Sensitive     VANCOMYCIN 1 SENSITIVE Sensitive     TRIMETH/SULFA <=10 SENSITIVE Sensitive     CLINDAMYCIN <=0.25 SENSITIVE Sensitive     RIFAMPIN <=0.5 SENSITIVE Sensitive     Inducible Clindamycin NEGATIVE Sensitive     * RARE STAPHYLOCOCCUS AUREUS    Labs: CBC: Recent Labs  Lab 11/01/23 1845 11/01/23 1849 11/02/23 0312  WBC 8.0  --  8.6  NEUTROABS 4.1  --   --   HGB 10.0* 11.6* 9.5*  HCT 34.9* 34.0* 31.7*  MCV 82.7  --  80.7  PLT 239  --  220   Basic Metabolic Panel: Recent Labs  Lab 11/01/23 1845 11/01/23 1849 11/02/23 0312  NA 141 143 140  K 3.8 3.8 3.7  CL 111 108 111  CO2 23  --  22  GLUCOSE 93 90 83  BUN 13 13 11   CREATININE 0.83 0.90 0.75  CALCIUM 8.5*  --  8.4*   Liver Function Tests: Recent Labs  Lab 11/01/23 1845 11/02/23 0312  AST 15 19  ALT 13 13  ALKPHOS 77 73  BILITOT 0.4 0.7  PROT 7.0 6.8  ALBUMIN 3.1* 3.0*   CBG: Recent Labs  Lab 11/01/23 1844  GLUCAP 90    Discharge time spent: greater than 30 minutes.  Signed: Meredeth Ide, MD [facility] 11/04/2023

## 2023-11-04 NOTE — Plan of Care (Signed)
  Problem: Education: Goal: Knowledge of disease or condition will improve Outcome: Progressing   Problem: Ischemic Stroke/TIA Tissue Perfusion: Goal: Complications of ischemic stroke/TIA will be minimized Outcome: Progressing   Problem: Health Behavior/Discharge Planning: Goal: Ability to manage health-related needs will improve Outcome: Not Progressing

## 2023-11-05 NOTE — Plan of Care (Signed)
Problem: Education: Goal: Knowledge of disease or condition will improve 11/05/2023 0405 by Ethel Rana, RN Outcome: Progressing 11/05/2023 0405 by Ethel Rana, RN Outcome: Progressing Goal: Knowledge of secondary prevention will improve (MUST DOCUMENT ALL) 11/05/2023 0405 by Ethel Rana, RN Outcome: Progressing 11/05/2023 0405 by Ethel Rana, RN Outcome: Progressing Goal: Knowledge of patient specific risk factors will improve Loraine Leriche N/A or DELETE if not current risk factor) 11/05/2023 0405 by Ethel Rana, RN Outcome: Progressing 11/05/2023 0405 by Ethel Rana, RN Outcome: Progressing   Problem: Ischemic Stroke/TIA Tissue Perfusion: Goal: Complications of ischemic stroke/TIA will be minimized 11/05/2023 0405 by Ethel Rana, RN Outcome: Progressing 11/05/2023 0405 by Ethel Rana, RN Outcome: Progressing   Problem: Coping: Goal: Will verbalize positive feelings about self 11/05/2023 0405 by Ethel Rana, RN Outcome: Progressing 11/05/2023 0405 by Ethel Rana, RN Outcome: Progressing Goal: Will identify appropriate support needs 11/05/2023 0405 by Ethel Rana, RN Outcome: Progressing 11/05/2023 0405 by Ethel Rana, RN Outcome: Progressing   Problem: Health Behavior/Discharge Planning: Goal: Ability to manage health-related needs will improve 11/05/2023 0405 by Ethel Rana, RN Outcome: Progressing 11/05/2023 0405 by Ethel Rana, RN Outcome: Progressing Goal: Goals will be collaboratively established with patient/family 11/05/2023 0405 by Ethel Rana, RN Outcome: Progressing 11/05/2023 0405 by Ethel Rana, RN Outcome: Progressing   Problem: Self-Care: Goal: Ability to participate in self-care as condition permits will improve 11/05/2023 0405 by Ethel Rana, RN Outcome: Progressing 11/05/2023 0405 by Ethel Rana, RN Outcome: Progressing Goal: Verbalization of feelings and  concerns over difficulty with self-care will improve 11/05/2023 0405 by Ethel Rana, RN Outcome: Progressing 11/05/2023 0405 by Ethel Rana, RN Outcome: Progressing Goal: Ability to communicate needs accurately will improve 11/05/2023 0405 by Ethel Rana, RN Outcome: Progressing 11/05/2023 0405 by Ethel Rana, RN Outcome: Progressing   Problem: Nutrition: Goal: Risk of aspiration will decrease 11/05/2023 0405 by Ethel Rana, RN Outcome: Progressing 11/05/2023 0405 by Ethel Rana, RN Outcome: Progressing Goal: Dietary intake will improve 11/05/2023 0405 by Ethel Rana, RN Outcome: Progressing 11/05/2023 0405 by Ethel Rana, RN Outcome: Progressing   Problem: Education: Goal: Knowledge of General Education information will improve Description: Including pain rating scale, medication(s)/side effects and non-pharmacologic comfort measures 11/05/2023 0405 by Ethel Rana, RN Outcome: Progressing 11/05/2023 0405 by Ethel Rana, RN Outcome: Progressing   Problem: Health Behavior/Discharge Planning: Goal: Ability to manage health-related needs will improve 11/05/2023 0405 by Ethel Rana, RN Outcome: Progressing 11/05/2023 0405 by Ethel Rana, RN Outcome: Progressing   Problem: Clinical Measurements: Goal: Ability to maintain clinical measurements within normal limits will improve 11/05/2023 0405 by Ethel Rana, RN Outcome: Progressing 11/05/2023 0405 by Ethel Rana, RN Outcome: Progressing Goal: Will remain free from infection 11/05/2023 0405 by Ethel Rana, RN Outcome: Progressing 11/05/2023 0405 by Ethel Rana, RN Outcome: Progressing Goal: Diagnostic test results will improve 11/05/2023 0405 by Ethel Rana, RN Outcome: Progressing 11/05/2023 0405 by Ethel Rana, RN Outcome: Progressing Goal: Respiratory complications will improve 11/05/2023 0405 by Ethel Rana, RN Outcome:  Progressing 11/05/2023 0405 by Ethel Rana, RN Outcome: Progressing Goal: Cardiovascular complication will be avoided 11/05/2023 0405 by Ethel Rana, RN Outcome: Progressing 11/05/2023 0405 by Ethel Rana, RN Outcome: Progressing   Problem: Activity: Goal: Risk for activity intolerance will decrease 11/05/2023 0405 by Ethel Rana, RN Outcome: Progressing  11/05/2023 0405 by Ethel Rana, RN Outcome: Progressing   Problem: Nutrition: Goal: Adequate nutrition will be maintained 11/05/2023 0405 by Ethel Rana, RN Outcome: Progressing 11/05/2023 0405 by Ethel Rana, RN Outcome: Progressing   Problem: Coping: Goal: Level of anxiety will decrease 11/05/2023 0405 by Ethel Rana, RN Outcome: Progressing 11/05/2023 0405 by Ethel Rana, RN Outcome: Progressing   Problem: Elimination: Goal: Will not experience complications related to bowel motility 11/05/2023 0405 by Ethel Rana, RN Outcome: Progressing 11/05/2023 0405 by Ethel Rana, RN Outcome: Progressing Goal: Will not experience complications related to urinary retention 11/05/2023 0405 by Ethel Rana, RN Outcome: Progressing 11/05/2023 0405 by Ethel Rana, RN Outcome: Progressing   Problem: Pain Management: Goal: General experience of comfort will improve 11/05/2023 0405 by Ethel Rana, RN Outcome: Progressing 11/05/2023 0405 by Ethel Rana, RN Outcome: Progressing   Problem: Safety: Goal: Ability to remain free from injury will improve 11/05/2023 0405 by Ethel Rana, RN Outcome: Progressing 11/05/2023 0405 by Ethel Rana, RN Outcome: Progressing   Problem: Skin Integrity: Goal: Risk for impaired skin integrity will decrease 11/05/2023 0405 by Ethel Rana, RN Outcome: Progressing 11/05/2023 0405 by Ethel Rana, RN Outcome: Progressing

## 2023-11-05 NOTE — Progress Notes (Signed)
She was discharged yesterday, however [facility]  was late yesterday so did not pick her up.  Plan to send her home today.  Patient is stable for discharge.

## 2023-11-05 NOTE — Progress Notes (Signed)
[facility] arrived to pick pt up for discharge home.  Huntley Dec (mother) updated via telephone that pt just left the hospital.  All belongings returned,  IV and tele monitor removed.  Pot discharged from unit without incident

## 2023-11-05 NOTE — Progress Notes (Addendum)
Called nonemergency line [phone]) this morning to see when patient would be getting picked up today. [facility] was late on previous shift so transport was delayed until today. Was instructed that patient is going home w/hh. Gave non-emergent personnel the home address on file for transport.

## 2024-05-05 ENCOUNTER — Other Ambulatory Visit: Payer: Self-pay

## 2024-05-05 ENCOUNTER — Emergency Department (HOSPITAL_COMMUNITY)

## 2024-05-05 ENCOUNTER — Inpatient Hospital Stay (HOSPITAL_COMMUNITY)
Admission: EM | Admit: 2024-05-05 | Discharge: 2024-05-13 | DRG: 065 | Disposition: A | Discharge status: Skilled Nursing Facility | Attending: Internal Medicine | Admitting: Internal Medicine

## 2024-05-05 ENCOUNTER — Encounter (HOSPITAL_COMMUNITY): Payer: Self-pay

## 2024-05-05 DIAGNOSIS — R519 Headache, unspecified: Principal | ICD-10-CM | POA: Diagnosis present

## 2024-05-05 DIAGNOSIS — G43909 Migraine, unspecified, not intractable, without status migrainosus: Secondary | ICD-10-CM | POA: Diagnosis present

## 2024-05-05 DIAGNOSIS — R479 Unspecified speech disturbances: Secondary | ICD-10-CM | POA: Diagnosis present

## 2024-05-05 DIAGNOSIS — R159 Full incontinence of feces: Secondary | ICD-10-CM | POA: Diagnosis present

## 2024-05-05 DIAGNOSIS — E876 Hypokalemia: Secondary | ICD-10-CM | POA: Diagnosis present

## 2024-05-05 DIAGNOSIS — I69320 Aphasia following cerebral infarction: Secondary | ICD-10-CM

## 2024-05-05 DIAGNOSIS — N920 Excessive and frequent menstruation with regular cycle: Secondary | ICD-10-CM | POA: Diagnosis present

## 2024-05-05 DIAGNOSIS — Z87891 Personal history of nicotine dependence: Secondary | ICD-10-CM

## 2024-05-05 DIAGNOSIS — Z91048 Other nonmedicinal substance allergy status: Secondary | ICD-10-CM

## 2024-05-05 DIAGNOSIS — I6601 Occlusion and stenosis of right middle cerebral artery: Secondary | ICD-10-CM | POA: Diagnosis present

## 2024-05-05 DIAGNOSIS — I6389 Other cerebral infarction: Secondary | ICD-10-CM | POA: Diagnosis not present

## 2024-05-05 DIAGNOSIS — R32 Unspecified urinary incontinence: Secondary | ICD-10-CM | POA: Diagnosis present

## 2024-05-05 DIAGNOSIS — F39 Unspecified mood [affective] disorder: Secondary | ICD-10-CM | POA: Diagnosis present

## 2024-05-05 DIAGNOSIS — Z7901 Long term (current) use of anticoagulants: Secondary | ICD-10-CM

## 2024-05-05 DIAGNOSIS — E66813 Obesity, class 3: Secondary | ICD-10-CM | POA: Diagnosis present

## 2024-05-05 DIAGNOSIS — Z6841 Body Mass Index (BMI) 40.0 and over, adult: Secondary | ICD-10-CM

## 2024-05-05 DIAGNOSIS — R531 Weakness: Secondary | ICD-10-CM | POA: Diagnosis not present

## 2024-05-05 DIAGNOSIS — I69354 Hemiplegia and hemiparesis following cerebral infarction affecting left non-dominant side: Secondary | ICD-10-CM

## 2024-05-05 DIAGNOSIS — Z8041 Family history of malignant neoplasm of ovary: Secondary | ICD-10-CM

## 2024-05-05 DIAGNOSIS — Z862 Personal history of diseases of the blood and blood-forming organs and certain disorders involving the immune mechanism: Secondary | ICD-10-CM

## 2024-05-05 DIAGNOSIS — I69359 Hemiplegia and hemiparesis following cerebral infarction affecting unspecified side: Secondary | ICD-10-CM

## 2024-05-05 DIAGNOSIS — M3119 Other thrombotic microangiopathy: Secondary | ICD-10-CM | POA: Diagnosis present

## 2024-05-05 DIAGNOSIS — R29716 NIHSS score 16: Secondary | ICD-10-CM | POA: Diagnosis present

## 2024-05-05 DIAGNOSIS — I10 Essential (primary) hypertension: Secondary | ICD-10-CM | POA: Diagnosis present

## 2024-05-05 DIAGNOSIS — D500 Iron deficiency anemia secondary to blood loss (chronic): Secondary | ICD-10-CM | POA: Diagnosis present

## 2024-05-05 DIAGNOSIS — G8191 Hemiplegia, unspecified affecting right dominant side: Secondary | ICD-10-CM | POA: Diagnosis present

## 2024-05-05 DIAGNOSIS — H9193 Unspecified hearing loss, bilateral: Secondary | ICD-10-CM | POA: Diagnosis present

## 2024-05-05 DIAGNOSIS — I639 Cerebral infarction, unspecified: Secondary | ICD-10-CM | POA: Diagnosis present

## 2024-05-05 DIAGNOSIS — R1310 Dysphagia, unspecified: Secondary | ICD-10-CM | POA: Diagnosis present

## 2024-05-05 DIAGNOSIS — Z8249 Family history of ischemic heart disease and other diseases of the circulatory system: Secondary | ICD-10-CM

## 2024-05-05 DIAGNOSIS — F259 Schizoaffective disorder, unspecified: Secondary | ICD-10-CM | POA: Insufficient documentation

## 2024-05-05 DIAGNOSIS — Z7401 Bed confinement status: Secondary | ICD-10-CM

## 2024-05-05 DIAGNOSIS — F251 Schizoaffective disorder, depressive type: Secondary | ICD-10-CM | POA: Diagnosis present

## 2024-05-05 DIAGNOSIS — D509 Iron deficiency anemia, unspecified: Secondary | ICD-10-CM | POA: Insufficient documentation

## 2024-05-05 DIAGNOSIS — Z833 Family history of diabetes mellitus: Secondary | ICD-10-CM

## 2024-05-05 DIAGNOSIS — F801 Expressive language disorder: Secondary | ICD-10-CM | POA: Diagnosis present

## 2024-05-05 DIAGNOSIS — E785 Hyperlipidemia, unspecified: Secondary | ICD-10-CM | POA: Diagnosis present

## 2024-05-05 DIAGNOSIS — Z79899 Other long term (current) drug therapy: Secondary | ICD-10-CM

## 2024-05-05 LAB — DIFFERENTIAL
Abs Immature Granulocytes: 0.02 10*3/uL (ref 0.00–0.07)
Basophils Absolute: 0 10*3/uL (ref 0.0–0.1)
Basophils Relative: 0 %
Eosinophils Absolute: 0.1 10*3/uL (ref 0.0–0.5)
Eosinophils Relative: 1 %
Immature Granulocytes: 0 %
Lymphocytes Relative: 34 %
Lymphs Abs: 2.6 10*3/uL (ref 0.7–4.0)
Monocytes Absolute: 0.5 10*3/uL (ref 0.1–1.0)
Monocytes Relative: 6 %
Neutro Abs: 4.4 10*3/uL (ref 1.7–7.7)
Neutrophils Relative %: 59 %

## 2024-05-05 LAB — COMPREHENSIVE METABOLIC PANEL WITH GFR
ALT: 11 U/L (ref 0–44)
AST: 13 U/L — ABNORMAL LOW (ref 15–41)
Albumin: 3.4 g/dL — ABNORMAL LOW (ref 3.5–5.0)
Alkaline Phosphatase: 67 U/L (ref 38–126)
Anion gap: 12 (ref 5–15)
BUN: 8 mg/dL (ref 6–20)
CO2: 22 mmol/L (ref 22–32)
Calcium: 8.8 mg/dL — ABNORMAL LOW (ref 8.9–10.3)
Chloride: 104 mmol/L (ref 98–111)
Creatinine, Ser: 0.83 mg/dL (ref 0.44–1.00)
GFR, Estimated: >60 mL/min (ref >60)
Glucose, Bld: 88 mg/dL (ref 70–99)
Potassium: 3.2 mmol/L — ABNORMAL LOW (ref 3.5–5.1)
Sodium: 138 mmol/L (ref 135–145)
Total Bilirubin: 0.8 mg/dL (ref 0.0–1.2)
Total Protein: 7.2 g/dL (ref 6.5–8.1)

## 2024-05-05 LAB — CBC
HCT: 33 % — ABNORMAL LOW (ref 36.0–46.0)
Hemoglobin: 9.1 g/dL — ABNORMAL LOW (ref 12.0–15.0)
MCH: 20.5 pg — ABNORMAL LOW (ref 26.0–34.0)
MCHC: 27.6 g/dL — ABNORMAL LOW (ref 30.0–36.0)
MCV: 74.5 fL — ABNORMAL LOW (ref 80.0–100.0)
Platelets: 245 10*3/uL (ref 150–400)
RBC: 4.43 MIL/uL (ref 3.87–5.11)
RDW: 17.7 % — ABNORMAL HIGH (ref 11.5–15.5)
WBC: 7.6 10*3/uL (ref 4.0–10.5)
nRBC: 0 % (ref 0.0–0.2)

## 2024-05-05 LAB — I-STAT CHEM 8, ED
BUN: 8 mg/dL (ref 6–20)
Calcium, Ion: 1.16 mmol/L (ref 1.15–1.40)
Chloride: 103 mmol/L (ref 98–111)
Creatinine, Ser: 0.9 mg/dL (ref 0.44–1.00)
Glucose, Bld: 85 mg/dL (ref 70–99)
HCT: 32 % — ABNORMAL LOW (ref 36.0–46.0)
Hemoglobin: 10.9 g/dL — ABNORMAL LOW (ref 12.0–15.0)
Potassium: 3.4 mmol/L — ABNORMAL LOW (ref 3.5–5.1)
Sodium: 139 mmol/L (ref 135–145)
TCO2: 23 mmol/L (ref 22–32)

## 2024-05-05 LAB — PROTIME-INR
INR: 1.3 — ABNORMAL HIGH (ref 0.8–1.2)
Prothrombin Time: 16.7 s — ABNORMAL HIGH (ref 11.4–15.2)

## 2024-05-05 LAB — HCG, SERUM, QUALITATIVE: Preg, Serum: NEGATIVE

## 2024-05-05 LAB — ETHANOL: Alcohol, Ethyl (B): <15 mg/dL (ref <15)

## 2024-05-05 LAB — APTT: aPTT: 30 s (ref 24–36)

## 2024-05-05 MED ORDER — IOHEXOL 350 MG/ML SOLN
75.0000 mL | Freq: Once | INTRAVENOUS | Status: AC | PRN
  Administered 2024-05-05: 75 mL via INTRAVENOUS

## 2024-05-05 MED ORDER — LORAZEPAM 1 MG PO TABS
1.0000 mg | ORAL_TABLET | Freq: Once | ORAL | Status: AC
  Administered 2024-05-06: 1 mg via ORAL
  Filled 2024-05-05: qty 1

## 2024-05-05 NOTE — ED Provider Notes (Signed)
 Care assumed from Dr. Nora Beal, Patient with history of stroke, now has headache which has resolved. She has pre-existing aphasia and left-side weakness which is unchanged. MRI is pending.   MRI shows small acute infarct in the anterior right temporal lobe as well as extensive chronic infarction in the middle cerebral artery distributions bilaterally.  I have independently viewed the images, and agree with the radiologist's interpretation.  I have discussed case with Dr. Murvin Arthurs neurology service who has reviewed the images and feels the patient needs to be admitted for further workup.  I have discussed case with Dr. Hayes Lipps of Internal Medicine Teaching Service, who agrees to admit the patient.  Results for orders placed or performed during the hospital encounter of 05/05/24  Ethanol   Collection Time: 05/05/24  7:20 PM  Result Value Ref Range   Alcohol, Ethyl (B) <15 <15 mg/dL  Protime-INR   Collection Time: 05/05/24  7:20 PM  Result Value Ref Range   Prothrombin Time 16.7 (H) 11.4 - 15.2 seconds   INR 1.3 (H) 0.8 - 1.2  APTT   Collection Time: 05/05/24  7:20 PM  Result Value Ref Range   aPTT 30 24 - 36 seconds  CBC   Collection Time: 05/05/24  7:20 PM  Result Value Ref Range   WBC 7.6 4.0 - 10.5 K/uL   RBC 4.43 3.87 - 5.11 MIL/uL   Hemoglobin 9.1 (L) 12.0 - 15.0 g/dL   HCT 86.5 (L) 78.4 - 69.6 %   MCV 74.5 (L) 80.0 - 100.0 fL   MCH 20.5 (L) 26.0 - 34.0 pg   MCHC 27.6 (L) 30.0 - 36.0 g/dL   RDW 29.5 (H) 28.4 - 13.2 %   Platelets 245 150 - 400 K/uL   nRBC 0.0 0.0 - 0.2 %  Differential   Collection Time: 05/05/24  7:20 PM  Result Value Ref Range   Neutrophils Relative % 59 %   Neutro Abs 4.4 1.7 - 7.7 K/uL   Lymphocytes Relative 34 %   Lymphs Abs 2.6 0.7 - 4.0 K/uL   Monocytes Relative 6 %   Monocytes Absolute 0.5 0.1 - 1.0 K/uL   Eosinophils Relative 1 %   Eosinophils Absolute 0.1 0.0 - 0.5 K/uL   Basophils Relative 0 %   Basophils Absolute 0.0 0.0 - 0.1 K/uL    Immature Granulocytes 0 %   Abs Immature Granulocytes 0.02 0.00 - 0.07 K/uL  Comprehensive metabolic panel   Collection Time: 05/05/24  7:20 PM  Result Value Ref Range   Sodium 138 135 - 145 mmol/L   Potassium 3.2 (L) 3.5 - 5.1 mmol/L   Chloride 104 98 - 111 mmol/L   CO2 22 22 - 32 mmol/L   Glucose, Bld 88 70 - 99 mg/dL   BUN 8 6 - 20 mg/dL   Creatinine, Ser 4.40 0.44 - 1.00 mg/dL   Calcium  8.8 (L) 8.9 - 10.3 mg/dL   Total Protein 7.2 6.5 - 8.1 g/dL   Albumin 3.4 (L) 3.5 - 5.0 g/dL   AST 13 (L) 15 - 41 U/L   ALT 11 0 - 44 U/L   Alkaline Phosphatase 67 38 - 126 U/L   Total Bilirubin 0.8 0.0 - 1.2 mg/dL   GFR, Estimated >10 >27 mL/min   Anion gap 12 5 - 15  hCG, serum, qualitative   Collection Time: 05/05/24  7:20 PM  Result Value Ref Range   Preg, Serum NEGATIVE NEGATIVE  I-stat chem 8, ED   Collection Time: 05/05/24  7:36 PM  Result Value Ref Range   Sodium 139 135 - 145 mmol/L   Potassium 3.4 (L) 3.5 - 5.1 mmol/L   Chloride 103 98 - 111 mmol/L   BUN 8 6 - 20 mg/dL   Creatinine, Ser 1.61 0.44 - 1.00 mg/dL   Glucose, Bld 85 70 - 99 mg/dL   Calcium , Ion 1.16 1.15 - 1.40 mmol/L   TCO2 23 22 - 32 mmol/L   Hemoglobin 10.9 (L) 12.0 - 15.0 g/dL   HCT 09.6 (L) 04.5 - 40.9 %   MR Brain Wo Contrast (neuro protocol) Result Date: 05/06/2024 CLINICAL DATA:  Headache with increased frequency or severity EXAM: MRI HEAD WITHOUT CONTRAST TECHNIQUE: Multiplanar, multiecho pulse sequences of the brain and surrounding structures were obtained without intravenous contrast. COMPARISON:  11/01/2023 FINDINGS: Brain: Punctate focus of restricted diffusion in the medial right temporal lobe. Extensive chronic infarction affecting the right MCA territory and to a lesser extent in the left temporoparietal cortex. Chronic blood products associated with these old infarcts. No acute hemorrhage. No hydrocephalus, mass, or collection. Vascular: Grossly preserved major flow voids. Skull and upper cervical  spine: Focal marrow lesion. Sinuses/Orbits: No acute finding IMPRESSION: Small acute infarct in the anterior right temporal lobe. Extensive chronic infarction in the right more than left MCA distribution. Electronically Signed   By: Ronnette Coke M.D.   On: 05/06/2024 04:51   CT ANGIO HEAD NECK W WO CM Result Date: 05/05/2024 CLINICAL DATA:  Neuro deficit, acute, stroke suspected EXAM: CT ANGIOGRAPHY HEAD AND NECK WITH AND WITHOUT CONTRAST TECHNIQUE: Multidetector CT imaging of the head and neck was performed using the standard protocol during bolus administration of intravenous contrast. Multiplanar CT image reconstructions and MIPs were obtained to evaluate the vascular anatomy. Carotid stenosis measurements (when applicable) are obtained utilizing NASCET criteria, using the distal internal carotid diameter as the denominator. RADIATION DOSE REDUCTION: This exam was performed according to the departmental dose-optimization program which includes automated exposure control, adjustment of the mA and/or kV according to patient size and/or use of iterative reconstruction technique. CONTRAST:  75mL OMNIPAQUE  IOHEXOL  350 MG/ML SOLN COMPARISON:  MRI November 19, 24. FINDINGS: CT HEAD FINDINGS Brain: Multiple remote infarcts within the right MCA and posterior left MCA territories. No evidence of acute large vascular territory infarct, acute hemorrhage, mass lesion or midline shift. Vascular: See below. Skull: No acute fracture. Sinuses/Orbits: No acute finding. Review of the MIP images confirms the above findings CTA NECK FINDINGS Aortic arch: Great vessel origins are patent without significant stenosis. Right carotid system: No evidence of dissection, stenosis (50% or greater), or occlusion. Left carotid system: No evidence of dissection, stenosis (50% or greater), or occlusion. Vertebral arteries: Left dominant. No evidence of dissection, stenosis (50% or greater), or occlusion. Skeleton: Negative. Other neck: No  acute abnormality on limited assessment. Upper chest: No acute abnormality on limited assessment. Review of the MIP images confirms the above findings CTA HEAD FINDINGS Anterior circulation: Bilateral intracranial ICAs, MCAs, and ACAs are patent without proximal imaged please significant stenosis. Attenuated right MCA vessels in the region of prior infarct. Posterior circulation: Bilateral intradural vertebral arteries, basilar artery and bilateral posterior cerebral arteries are patent without proximal hemodynamically significant stenosis. Venous sinuses: As permitted by contrast timing, patent. Review of the MIP images confirms the above findings IMPRESSION: 1. No emergent large vessel occlusion or proximal hemodynamically significant stenosis. 2. Multiple remote infarcts within the right MCA and posterior left MCA territories. Electronically Signed   By: Benjiman Bras  Rochelle Chu M.D.   On: 05/05/2024 21:53      Alissa April, MD 05/06/24 [phone]

## 2024-05-05 NOTE — ED Notes (Signed)
 Patient transported to CT

## 2024-05-05 NOTE — ED Notes (Signed)
 Mother Sara Covell [phone] would like an update asap

## 2024-05-05 NOTE — ED Notes (Signed)
 Dr. Nolia Baumgartner to cancel urinalysis order

## 2024-05-05 NOTE — ED Provider Notes (Signed)
 Patient signed out to me at 2100 by Dr. Nolia Baumgartner pending CT read with likely plan for MRI and neuro consult if no abnormality on CT. 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 Reportedly was sudden onset. Does not appear to have new neurologic deficits.  Clinical Course as of 05/05/24 4098  Sat May 05, 2024  2204 No significant abnormality on CT imaging. Will plan for MRI. Patient communicates via writing. States she does not have a headache right now, only foot pain. 2+ bilateral DP pulses, moving LE at baseline. RUE moving at baseline. Will plan for MRI. Patient requested anxiolysis for MRI. [VK]  2333 Patient signed out to Dr. Candelaria Chaco pending MRI and reassessment. [VK]    Clinical Course User Index [VK] Kingsley, Kenedy Haisley K, DO      Kingsley, Renise Gillies K, [place] 05/05/24 2334

## 2024-05-05 NOTE — ED Triage Notes (Addendum)
 Pt to er via ems, per ems pt is here for some R arm weakness, states that pt has a hx of stroke and family thought that she might be having another because she couldn't move her R arm, pt moving R arm, unknown last known well. Per ems pt can move R arm and is reporting a headache, states that she took tylenol  at home.  Pt has hx of stroke, pt has significant deficits and limited speech. Pt answering questions in one word responses. Dad's phone number is [phone]

## 2024-05-05 NOTE — ED Notes (Signed)
 Pt back from CT

## 2024-05-05 NOTE — ED Provider Notes (Signed)
  EMERGENCY DEPARTMENT AT [facility] Provider Note   CSN: [id number] Arrival date & time: 05/05/24  1758     History  Chief Complaint  Patient presents with   Weakness    Krista Maddox is a 38 y.o. female.  Patient is a 38 year old female who has a history of cryptogenic strokes with left-sided hemiparesis and is nonverbal at baseline who presents with a headache.  Per EMS, they were called because she has a headache and reportedly some right-sided weakness.  Patient is very difficult to communicate with which on chart review, sounds like her baseline.  I can write down questions and sometimes she will answer them.  She indicates that she has a headache and points to the back of her head.  When I ask her if she has any weakness, she points to her left side and shows me her left arm which is paralyzed.  I try to ask her if she is having any new weakness or numbness and I cannot get an answer.  I am not able to ascertain when the headache started.  I attempted to contact both the mother and the father who are listed as emergency contacts but both went to voicemail.       Home Medications Prior to Admission medications   Medication Sig Start Date End Date Taking? Authorizing Provider  acetaminophen  (TYLENOL ) 650 MG CR tablet Take 1,300 mg by mouth 2 (two) times daily.    [provider]  Acetaminophen -Caff-Pyrilamine (MIDOL  COMPLETE PO) Take 2 tablets by mouth 2 (two) times daily as needed (Pain).    [provider]  apixaban  (ELIQUIS ) 5 MG TABS tablet Take 5 mg by mouth 2 (two) times daily. 09/15/23   [provider]  atenolol  (TENORMIN ) 25 MG tablet Take 1 tablet (25 mg total) by mouth daily. Patient not taking: Reported on 11/02/2023 05/16/21   Hannah Lewis A, NP  atorvastatin  (LIPITOR ) 40 MG tablet Take 40 mg by mouth at bedtime.    [provider]  busPIRone  (BUSPAR ) 5 MG tablet Take 5 mg by mouth 2 (two) times  daily. 09/30/23   [provider]  calcium  carbonate (TUMS - DOSED IN MG ELEMENTAL CALCIUM ) 500 MG chewable tablet Chew 2 tablets by mouth as needed for indigestion or heartburn.    [provider]  Docusate Calcium  (STOOL SOFTENER PO) Take 1 capsule by mouth daily.    [provider]  ezetimibe  (ZETIA ) 10 MG tablet Place 1 tablet (10 mg total) into feeding tube daily. Patient taking differently: Take 10 mg by mouth at bedtime. 05/16/21   Bailey-Modzik, Delila A, NP  gabapentin  (NEURONTIN ) 300 MG capsule Take 300 mg by mouth 2 (two) times daily. 10/11/23   [provider]  OLANZapine  (ZYPREXA ) 2.5 MG tablet Take 2.5 mg by mouth at bedtime. 06/15/23   [provider]  topiramate  (TOPAMAX ) 50 MG tablet Place 3 tablets (150 mg total) into feeding tube 2 (two) times daily. Patient taking differently: Take 150 mg by mouth 2 (two) times daily. 05/15/21   Bailey-Modzik, Delila A, NP  venlafaxine  (EFFEXOR ) 100 MG tablet Take 150 mg by mouth 2 (two) times daily.    [provider]      Allergies    Tape    Review of Systems   Review of Systems  Unable to perform ROS: Patient nonverbal    Physical Exam Updated Vital Signs BP 130/84   Pulse 85   Temp 98.4 F (36.9  C) (Oral)   Resp 16   Ht 5\' 4"  (1.626 m)   Wt 113.4 kg   SpO2 100%   BMI 42.91 kg/m  Physical Exam Constitutional:      Appearance: She is well-developed.  HENT:     Head: Normocephalic and atraumatic.  Eyes:     Pupils: Pupils are equal, round, and reactive to light.  Cardiovascular:     Rate and Rhythm: Normal rate and regular rhythm.     Heart sounds: Normal heart sounds.  Pulmonary:     Effort: Pulmonary effort is normal. No respiratory distress.     Breath sounds: Normal breath sounds. No wheezing or rales.  Chest:     Chest wall: No tenderness.  Abdominal:     General: Bowel sounds are normal.     Palpations: Abdomen is soft.     Tenderness: There is no abdominal  tenderness. There is no guarding or rebound.  Musculoskeletal:        General: Normal range of motion.     Cervical back: Normal range of motion and neck supple.  Lymphadenopathy:     Cervical: No cervical adenopathy.  Skin:    General: Skin is warm and dry.     Findings: No rash.  Neurological:     Mental Status: She is alert.     Comments: Awake and alert, limited verbal response.  She can read some questions and at times will answer them other times will have no response.  She has left side paralysis of her arm and weakness of her left leg.  She does have good strength in her right arm and right leg.  She has difficulty following commands.  Cannot really assess her visual acuity     ED Results / Procedures / Treatments   Labs (all labs ordered are listed, but only abnormal results are displayed) Labs Reviewed  PROTIME-INR - Abnormal; Notable for the following components:      Result Value   Prothrombin Time 16.7 (*)    INR 1.3 (*)    All other components within normal limits  CBC - Abnormal; Notable for the following components:   Hemoglobin 9.1 (*)    HCT 33.0 (*)    MCV 74.5 (*)    MCH 20.5 (*)    MCHC 27.6 (*)    RDW 17.7 (*)    All other components within normal limits  COMPREHENSIVE METABOLIC PANEL WITH GFR - Abnormal; Notable for the following components:   Potassium 3.2 (*)    Calcium  8.8 (*)    Albumin 3.4 (*)    AST 13 (*)    All other components within normal limits  I-STAT CHEM 8, ED - Abnormal; Notable for the following components:   Potassium 3.4 (*)    Hemoglobin 10.9 (*)    HCT 32.0 (*)    All other components within normal limits  ETHANOL  APTT  DIFFERENTIAL  HCG, SERUM, QUALITATIVE  RAPID URINE DRUG SCREEN, HOSP PERFORMED    EKG EKG Interpretation Date/Time:  Saturday May 05 2024 18:18:07 EDT Ventricular Rate:  90 PR Interval:  143 QRS Duration:  86 QT Interval:  355 QTC Calculation: 435 R Axis:   67  Text Interpretation: Sinus rhythm  Low voltage, precordial leads Abnormal R-wave progression, early transition since last tracing no significant change Confirmed by Hershel Los [phone]) on 05/05/2024 7:27:06 PM  Radiology No results found.  Procedures Procedures    Medications Ordered in ED Medications  iohexol  (OMNIPAQUE )  350 MG/ML injection 75 mL (75 mLs Intravenous Contrast Given 05/05/24 2018)    ED Course/ Medical Decision Making/ A&P                                 Medical Decision Making Amount and/or Complexity of Data Reviewed Labs: ordered. Radiology: ordered.  Risk Prescription drug management.   Patient is a 38 year old female with a prior history of stroke and left-sided hemiparesis who presents with a headache.  It is difficult to ascertain whether she is having any other new symptoms given her nonverbal state.  She is pointing to the back of her head and indicates that she has a headache and keeps saying "thunderclap, thunderclap, thunderclap".  Unable to elicit when this started.  Labs reviewed are nonconcerning.  Head CT is pending.  Will need an MRI following that most likely.  Care turned over to Dr. Nora Beal.  Final Clinical Impression(s) / ED Diagnoses Final diagnoses:  Acute nonintractable headache, unspecified headache type    Rx / DC Orders ED Discharge Orders     None         Hershel Los, MD 05/05/24 2138

## 2024-05-06 ENCOUNTER — Emergency Department (HOSPITAL_COMMUNITY)

## 2024-05-06 DIAGNOSIS — Z7401 Bed confinement status: Secondary | ICD-10-CM | POA: Diagnosis not present

## 2024-05-06 DIAGNOSIS — Z8041 Family history of malignant neoplasm of ovary: Secondary | ICD-10-CM | POA: Diagnosis not present

## 2024-05-06 DIAGNOSIS — I69320 Aphasia following cerebral infarction: Secondary | ICD-10-CM

## 2024-05-06 DIAGNOSIS — G43909 Migraine, unspecified, not intractable, without status migrainosus: Secondary | ICD-10-CM | POA: Diagnosis not present

## 2024-05-06 DIAGNOSIS — I10 Essential (primary) hypertension: Secondary | ICD-10-CM | POA: Insufficient documentation

## 2024-05-06 DIAGNOSIS — R32 Unspecified urinary incontinence: Secondary | ICD-10-CM | POA: Diagnosis present

## 2024-05-06 DIAGNOSIS — Z87891 Personal history of nicotine dependence: Secondary | ICD-10-CM

## 2024-05-06 DIAGNOSIS — Z7901 Long term (current) use of anticoagulants: Secondary | ICD-10-CM | POA: Diagnosis not present

## 2024-05-06 DIAGNOSIS — D500 Iron deficiency anemia secondary to blood loss (chronic): Secondary | ICD-10-CM | POA: Diagnosis present

## 2024-05-06 DIAGNOSIS — Z833 Family history of diabetes mellitus: Secondary | ICD-10-CM | POA: Diagnosis not present

## 2024-05-06 DIAGNOSIS — R1310 Dysphagia, unspecified: Secondary | ICD-10-CM | POA: Diagnosis present

## 2024-05-06 DIAGNOSIS — N920 Excessive and frequent menstruation with regular cycle: Secondary | ICD-10-CM | POA: Diagnosis present

## 2024-05-06 DIAGNOSIS — I69391 Dysphagia following cerebral infarction: Secondary | ICD-10-CM

## 2024-05-06 DIAGNOSIS — E876 Hypokalemia: Secondary | ICD-10-CM | POA: Diagnosis present

## 2024-05-06 DIAGNOSIS — I639 Cerebral infarction, unspecified: Secondary | ICD-10-CM | POA: Diagnosis not present

## 2024-05-06 DIAGNOSIS — E66813 Obesity, class 3: Secondary | ICD-10-CM | POA: Diagnosis present

## 2024-05-06 DIAGNOSIS — I6389 Other cerebral infarction: Secondary | ICD-10-CM | POA: Diagnosis present

## 2024-05-06 DIAGNOSIS — R29716 NIHSS score 16: Secondary | ICD-10-CM

## 2024-05-06 DIAGNOSIS — Z79899 Other long term (current) drug therapy: Secondary | ICD-10-CM | POA: Diagnosis not present

## 2024-05-06 DIAGNOSIS — I69359 Hemiplegia and hemiparesis following cerebral infarction affecting unspecified side: Secondary | ICD-10-CM

## 2024-05-06 DIAGNOSIS — I69354 Hemiplegia and hemiparesis following cerebral infarction affecting left non-dominant side: Secondary | ICD-10-CM

## 2024-05-06 DIAGNOSIS — Z8249 Family history of ischemic heart disease and other diseases of the circulatory system: Secondary | ICD-10-CM | POA: Diagnosis not present

## 2024-05-06 DIAGNOSIS — E785 Hyperlipidemia, unspecified: Secondary | ICD-10-CM

## 2024-05-06 DIAGNOSIS — R531 Weakness: Secondary | ICD-10-CM | POA: Diagnosis present

## 2024-05-06 DIAGNOSIS — H9193 Unspecified hearing loss, bilateral: Secondary | ICD-10-CM | POA: Diagnosis present

## 2024-05-06 DIAGNOSIS — D509 Iron deficiency anemia, unspecified: Secondary | ICD-10-CM | POA: Diagnosis not present

## 2024-05-06 DIAGNOSIS — Z6841 Body Mass Index (BMI) 40.0 and over, adult: Secondary | ICD-10-CM | POA: Diagnosis not present

## 2024-05-06 DIAGNOSIS — G8191 Hemiplegia, unspecified affecting right dominant side: Secondary | ICD-10-CM | POA: Diagnosis present

## 2024-05-06 DIAGNOSIS — F259 Schizoaffective disorder, unspecified: Secondary | ICD-10-CM | POA: Insufficient documentation

## 2024-05-06 DIAGNOSIS — R29712 NIHSS score 12: Secondary | ICD-10-CM | POA: Diagnosis not present

## 2024-05-06 DIAGNOSIS — R159 Full incontinence of feces: Secondary | ICD-10-CM | POA: Diagnosis present

## 2024-05-06 DIAGNOSIS — F39 Unspecified mood [affective] disorder: Secondary | ICD-10-CM | POA: Diagnosis present

## 2024-05-06 DIAGNOSIS — F251 Schizoaffective disorder, depressive type: Secondary | ICD-10-CM | POA: Diagnosis present

## 2024-05-06 LAB — LIPID PANEL
Cholesterol: 97 mg/dL (ref 0–200)
HDL: 41 mg/dL (ref >40)
LDL Cholesterol: 30 mg/dL (ref 0–99)
Total CHOL/HDL Ratio: 2.4 ratio
Triglycerides: 129 mg/dL (ref <150)
VLDL: 26 mg/dL (ref 0–40)

## 2024-05-06 LAB — HEMOGLOBIN A1C
Hgb A1c MFr Bld: 4.5 % — ABNORMAL LOW (ref 4.8–5.6)
Mean Plasma Glucose: 82.45 mg/dL

## 2024-05-06 LAB — HIV ANTIBODY (ROUTINE TESTING W REFLEX): HIV Screen 4th Generation wRfx: NONREACTIVE

## 2024-05-06 MED ORDER — EZETIMIBE 10 MG PO TABS
10.0000 mg | ORAL_TABLET | Freq: Every day | ORAL | Status: DC
Start: 2024-05-06 — End: 2024-05-13
  Administered 2024-05-06 – 2024-05-12 (×7): 10 mg via ORAL
  Filled 2024-05-06 (×7): qty 1

## 2024-05-06 MED ORDER — ORAL CARE MOUTH RINSE
15.0000 mL | OROMUCOSAL | Status: DC
  Administered 2024-05-06 – 2024-05-13 (×27): 15 mL via OROMUCOSAL

## 2024-05-06 MED ORDER — APIXABAN 2.5 MG PO TABS
5.0000 mg | ORAL_TABLET | Freq: Two times a day (BID) | ORAL | Status: DC
  Administered 2024-05-06 – 2024-05-13 (×15): 5 mg via ORAL
  Filled 2024-05-06 (×13): qty 2
  Filled 2024-05-06: qty 1
  Filled 2024-05-06: qty 2

## 2024-05-06 MED ORDER — POTASSIUM CHLORIDE CRYS ER 20 MEQ PO TBCR
40.0000 meq | EXTENDED_RELEASE_TABLET | Freq: Once | ORAL | Status: AC
  Administered 2024-05-06: 40 meq via ORAL
  Filled 2024-05-06: qty 2

## 2024-05-06 MED ORDER — OLANZAPINE 2.5 MG PO TABS
2.5000 mg | ORAL_TABLET | Freq: Every day | ORAL | Status: DC
  Administered 2024-05-06 – 2024-05-12 (×7): 2.5 mg via ORAL
  Filled 2024-05-06 (×8): qty 1

## 2024-05-06 MED ORDER — BUSPIRONE HCL 10 MG PO TABS
5.0000 mg | ORAL_TABLET | Freq: Two times a day (BID) | ORAL | Status: DC
  Administered 2024-05-06 – 2024-05-13 (×15): 5 mg via ORAL
  Filled 2024-05-06 (×15): qty 1

## 2024-05-06 MED ORDER — ATORVASTATIN CALCIUM 40 MG PO TABS
40.0000 mg | ORAL_TABLET | Freq: Every day | ORAL | Status: DC
  Administered 2024-05-06 – 2024-05-13 (×8): 40 mg via ORAL
  Filled 2024-05-06 (×8): qty 1

## 2024-05-06 MED ORDER — TOPIRAMATE 25 MG PO TABS
150.0000 mg | ORAL_TABLET | Freq: Two times a day (BID) | ORAL | Status: DC
  Administered 2024-05-06 – 2024-05-13 (×15): 150 mg via ORAL
  Filled 2024-05-06 (×5): qty 2
  Filled 2024-05-06: qty 6
  Filled 2024-05-06 (×9): qty 2

## 2024-05-06 MED ORDER — CYCLOBENZAPRINE HCL 10 MG PO TABS
10.0000 mg | ORAL_TABLET | Freq: Two times a day (BID) | ORAL | Status: DC
  Administered 2024-05-06 – 2024-05-13 (×15): 10 mg via ORAL
  Filled 2024-05-06 (×15): qty 1

## 2024-05-06 MED ORDER — GABAPENTIN 300 MG PO CAPS
300.0000 mg | ORAL_CAPSULE | Freq: Two times a day (BID) | ORAL | Status: DC
  Administered 2024-05-06 – 2024-05-13 (×15): 300 mg via ORAL
  Filled 2024-05-06 (×15): qty 1

## 2024-05-06 MED ORDER — ORAL CARE MOUTH RINSE: 15.0000 mL | OROMUCOSAL | Status: DC | PRN

## 2024-05-06 MED ORDER — VENLAFAXINE HCL 50 MG PO TABS
100.0000 mg | ORAL_TABLET | Freq: Two times a day (BID) | ORAL | Status: DC
  Administered 2024-05-06 – 2024-05-13 (×15): 100 mg via ORAL
  Filled 2024-05-06 (×11): qty 2
  Filled 2024-05-06: qty 4
  Filled 2024-05-06 (×6): qty 2

## 2024-05-06 NOTE — Consult Note (Signed)
 NEUROLOGY CONSULT NOTE   Date of service: May 06, 2024 Patient Name: Krista Maddox MRN:  [id number] DOB:  1986/10/05 Chief Complaint: "stroke" Requesting Provider: Alissa April, MD  History of Present Illness  Krista Maddox is a 38 y.o. 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 She had MRI Brain which demonstrated an acute R temporal lobe stroke.  Patient feels back to her baseline. Denies any headache but reports some pain in her feet. Denies any complaints. She is able to read when I write things down. Has been compliant with her eliquis .  LKW: unclear Modified rankin score: 5-Severe disability-bedridden, incontinent, needs constant attention IV Thrombolysis: not offered, unclear LKW EVT: Low suspicion for LVO.  NIHSS components Score: Comment  1a Level of Conscious 0[x]  1[]  2[]  3[]      1b LOC Questions 0[]  1[]  2[x]       1c LOC Commands 0[]  1[]  2[x]       2 Best Gaze 0[x]  1[]  2[]       3 Visual 0[]  1[x]  2[]  3[]      4 Facial Palsy 0[]  1[x]  2[]  3[]      5a Motor Arm - left 0[]  1[]  2[]  3[x]  4[]  UN[]    5b Motor Arm - Right 0[x]  1[]  2[]  3[]  4[]  UN[]    6a Motor Leg - Left 0[]  1[]  2[x]  3[]  4[]  UN[]    6b Motor Leg - Right 0[x]  1[]  2[]  3[]  4[]  UN[]    7 Limb Ataxia 0[x]  1[]  2[]  UN[]      8 Sensory 0[]  1[x]  2[]  UN[]      9 Best Language 0[]  1[]  2[x]  3[]      10 Dysarthria 0[]  1[x]  2[]  UN[]      11 Extinct. and Inattention 0[]  1[x]  2[]       TOTAL: 16      ROS   Unable to ascertain due to limited speech output.  Past History   Past Medical History:  Diagnosis Date   DDD (degenerative disc disease), lumbar    Headache    Other disorders of optic nerve, not elsewhere classified, unspecified eye    Stroke (HCC)    Stroke (HCC)    TTP (thrombotic thrombocytopenic purpura) (HCC)     Past Surgical History:  Procedure Laterality Date    CESAREAN SECTION     CHOLECYSTECTOMY     ESOPHAGOGASTRODUODENOSCOPY (EGD) WITH PROPOFOL  N/A 04/16/2021   Procedure: ESOPHAGOGASTRODUODENOSCOPY (EGD) WITH PROPOFOL ;  Surgeon: Dorena Gander, MD;  Location: [facility] ENDOSCOPY;  Service: General;  Laterality: N/A;   IR CT HEAD LTD  04/02/2021   IR PERCUTANEOUS ART THROMBECTOMY/INFUSION INTRACRANIAL INC DIAG ANGIO  04/02/2021   PEG PLACEMENT N/A 04/16/2021   Procedure: PERCUTANEOUS ENDOSCOPIC GASTROSTOMY (PEG) PLACEMENT;  Surgeon: Dorena Gander, MD;  Location: [facility] ENDOSCOPY;  Service: General;  Laterality: N/A;   RADIOLOGY WITH ANESTHESIA N/A 04/02/2021   Procedure: IR WITH ANESTHESIA;  Surgeon: Radiologist, Medication, MD;  Location: [facility] OR;  Service: Radiology;  Laterality: N/A;   TEE WITHOUT CARDIOVERSION N/A 08/22/2017   Procedure: TRANSESOPHAGEAL ECHOCARDIOGRAM (TEE);  Surgeon: Maudine Sos, MD;  Location: [facility] ENDOSCOPY;  Service: Cardiovascular;  Laterality: N/A;   WISDOM TOOTH EXTRACTION      Family History: Family History  Problem Relation Age of Onset   Diabetes Mother    Hypertension Mother    Ovarian cancer Mother        Hysterectomy   Healthy Father    Ovarian  cancer Maternal Grandmother        Hysterectomy    Social History  reports that she quit smoking about 6 years ago. Her smoking use included cigarettes. She has never used smokeless tobacco. She reports that she does not drink alcohol and does not use drugs.  Allergies  Allergen Reactions   Tape Rash    Cannot tolerate paper tape    Medications  No current facility-administered medications for this encounter.  Current Outpatient Medications:    acetaminophen  (TYLENOL ) 650 MG CR tablet, Take 1,300 mg by mouth 2 (two) times daily., Disp: , Rfl:    Acetaminophen -Caff-Pyrilamine (MIDOL  COMPLETE PO), Take 2 tablets by mouth 2 (two) times daily as needed (Pain)., Disp: , Rfl:    apixaban  (ELIQUIS ) 5 MG TABS tablet, Take 5 mg by mouth 2 (two) times daily., Disp: , Rfl:     atenolol  (TENORMIN ) 25 MG tablet, Take 1 tablet (25 mg total) by mouth daily. (Patient not taking: Reported on 11/02/2023), Disp: , Rfl:    atorvastatin  (LIPITOR ) 40 MG tablet, Take 40 mg by mouth at bedtime., Disp: , Rfl:    busPIRone  (BUSPAR ) 5 MG tablet, Take 5 mg by mouth 2 (two) times daily., Disp: , Rfl:    calcium  carbonate (TUMS - DOSED IN MG ELEMENTAL CALCIUM ) 500 MG chewable tablet, Chew 2 tablets by mouth as needed for indigestion or heartburn., Disp: , Rfl:    Docusate Calcium  (STOOL SOFTENER PO), Take 1 capsule by mouth daily., Disp: , Rfl:    ezetimibe  (ZETIA ) 10 MG tablet, Place 1 tablet (10 mg total) into feeding tube daily. (Patient taking differently: Take 10 mg by mouth at bedtime.), Disp: , Rfl:    gabapentin  (NEURONTIN ) 300 MG capsule, Take 300 mg by mouth 2 (two) times daily., Disp: , Rfl:    OLANZapine  (ZYPREXA ) 2.5 MG tablet, Take 2.5 mg by mouth at bedtime., Disp: , Rfl:    topiramate  (TOPAMAX ) 50 MG tablet, Place 3 tablets (150 mg total) into feeding tube 2 (two) times daily. (Patient taking differently: Take 150 mg by mouth 2 (two) times daily.), Disp: , Rfl:    venlafaxine  (EFFEXOR ) 100 MG tablet, Take 150 mg by mouth 2 (two) times daily., Disp: , Rfl:   Vitals   Vitals:   05/06/24 0315 05/06/24 0330 05/06/24 0345 05/06/24 0552  BP:    (!) 125/106  Pulse:    97  Resp: 16 19 16  (!) 21  Temp:    98.2 F (36.8 C)  TempSrc:    Oral  SpO2:    100%  Weight:      Height:        Body mass index is 42.91 kg/m.  Physical Exam   General: Laying comfortably in bed; in no acute distress.  HENT: Normal oropharynx and mucosa. Normal external appearance of ears and nose.  Neck: Supple, no pain or tenderness  CV: No JVD. No peripheral edema.  Pulmonary: Symmetric Chest rise. Normal respiratory effort.  Abdomen: Soft to touch, non-tender.  Ext: No cyanosis, edema, or deformity  Skin: No rash. Normal palpation of skin.   Musculoskeletal: Normal digits and nails by  inspection. No clubbing.   Neurologic Examination  Mental status/Cognition: Alert, oriented to self only. Speech/language: non fluent, only says single words. Read off the white board but unable to get her to follow commands, Cranial nerves:   CN II Pupils equal and reactive to light, blinks to threat BL   CN III,IV,VI EOM intact, no gaze preference  or deviation, no nystagmus    CN V normal sensation in V1, V2, and V3 segments bilaterally    CN VII Slight left facial droop   CN VIII normal hearing to speech    CN IX & X normal palatal elevation, no uvular deviation    CN XI 5/5 head turn and 5/5 shoulder shrug bilaterally    CN XII midline tongue protrusion    Motor:  Muscle bulk: poor in LUE 0/5 strength in LUE. LLE: 3/5 in hip flexion. RUE: 4/5 RLE: 4/5.  Sensation:  Light touch Decreased on left.   Pin prick    Temperature    Vibration   Proprioception    Coordination/Complex Motor:  - Unable to assess.  Labs/Imaging/Neurodiagnostic studies   CBC:  Recent Labs  Lab 06-04-24 1920 06/04/2024 1936  WBC 7.6  --   NEUTROABS 4.4  --   HGB 9.1* 10.9*  HCT 33.0* 32.0*  MCV 74.5*  --   PLT 245  --    Basic Metabolic Panel:  Lab Results  Component Value Date   NA 139 2024-06-04   K 3.4 (L) 06-04-24   CO2 22 04-Jun-2024   GLUCOSE 85 2024-06-04   BUN 8 06/04/24   CREATININE 0.90 2024-06-04   CALCIUM  8.8 (L) 04-Jun-2024   GFRNONAA >60 04-Jun-2024   GFRAA 109 07/26/2018   Lipid Panel:  Lab Results  Component Value Date   LDLCALC 37 11/02/2023   HgbA1c:  Lab Results  Component Value Date   HGBA1C 5.0 11/02/2023   Urine Drug Screen:     Component Value Date/Time   LABOPIA NONE DETECTED 11/02/2023 0140   COCAINSCRNUR NONE DETECTED 11/02/2023 0140   LABBENZ NONE DETECTED 11/02/2023 0140   AMPHETMU NONE DETECTED 11/02/2023 0140   THCU NONE DETECTED 11/02/2023 0140   LABBARB NONE DETECTED 11/02/2023 0140    Alcohol Level     Component Value Date/Time    ETH <15 Jun 04, 2024 1920   INR  Lab Results  Component Value Date   INR 1.3 (H) 2024/06/04   APTT  Lab Results  Component Value Date   APTT 30 06/04/24   AED levels: No results found for: "PHENYTOIN", "ZONISAMIDE", "LAMOTRIGINE", "LEVETIRACETA"  CT angio Head and Neck with contrast(Personally reviewed): Aaron Aas No emergent large vessel occlusion or proximal hemodynamically significant stenosis. 2. Multiple remote infarcts within the right MCA and posterior left MCA territories.  MRI Brain(Personally reviewed): Small acute infarct in the anterior right temporal lobe.   Extensive chronic infarction in the right more than left MCA distribution.  ASSESSMENT   Krista Maddox is a 38 y.o. 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 She had MRI Brain which demonstrated an acute R temporal lobe stroke.  Etiology of stroke is unclear, has had extensive workup.  RECOMMENDATIONS  - Frequent Neuro checks per stroke unit protocol - Recommend obtaining Lipid panel with LDL - Please start statin if LDL > 70 - Recommend HbA1c to evaluate for diabetes and how well it is controlled. - continue Eliquis . - SBP goal - permissive hypertension first 24 h < 220/110. Held home meds.  - Recommend Telemetry monitoring for arrythmia - Recommend bedside swallow screen prior to PO intake. - Stroke education booklet - Recommend PT/OT/SLP consult - stroke team to follow. ______________________________________________________________________    Signed, Adrien Dietzman, MD [facility]

## 2024-05-06 NOTE — Progress Notes (Signed)
 STROKE TEAM PROGRESS NOTE   Presented 5/24 with headache and some R sided weakness. She had MRI Brain which demonstrated an acute R temporal lobe stroke.  mRs: 5.  TNK not offered due to unclear last known well.   INTERIM HISTORY/SUBJECTIVE  On exam, patient sleeping in bed but arousable.  She did smile when ask and mimicked.  Left side flaccid with increased tone.  She is able to read, points to needs for communication. Repeated blanket, when asked if she was cold, she nodded yes.   Left vm for father, tim Aja to further discuss patient's baseline assessment and previous stroke history.   OBJECTIVE  CBC    Component Value Date/Time   WBC 7.6 05/05/2024 1920   RBC 4.43 05/05/2024 1920   HGB 10.9 (L) 05/05/2024 1936   HGB 13.9 07/26/2018 1610   HCT 32.0 (L) 05/05/2024 1936   HCT 41.5 07/26/2018 1610   PLT 245 05/05/2024 1920   PLT 244 07/26/2018 1610   MCV 74.5 (L) 05/05/2024 1920   MCV 94 07/26/2018 1610   MCH 20.5 (L) 05/05/2024 1920   MCHC 27.6 (L) 05/05/2024 1920   RDW 17.7 (H) 05/05/2024 1920   RDW 13.2 07/26/2018 1610   LYMPHSABS 2.6 05/05/2024 1920   LYMPHSABS 2.9 07/26/2018 1610   MONOABS 0.5 05/05/2024 1920   EOSABS 0.1 05/05/2024 1920   EOSABS 0.2 07/26/2018 1610   BASOSABS 0.0 05/05/2024 1920   BASOSABS 0.0 07/26/2018 1610    BMET    Component Value Date/Time   NA 139 05/05/2024 1936   NA 141 07/26/2018 1610   K 3.4 (L) 05/05/2024 1936   CL 103 05/05/2024 1936   CO2 22 05/05/2024 1920   GLUCOSE 85 05/05/2024 1936   BUN 8 05/05/2024 1936   BUN 10 07/26/2018 1610   CREATININE 0.90 05/05/2024 1936   CALCIUM  8.8 (L) 05/05/2024 1920   GFRNONAA >60 05/05/2024 1920    IMAGING past 24 hours MR Brain Wo Contrast (neuro protocol) Result Date: 05/06/2024 CLINICAL DATA:  Headache with increased frequency or severity EXAM: MRI HEAD WITHOUT CONTRAST TECHNIQUE: Multiplanar, multiecho pulse sequences of the brain and surrounding structures were obtained  without intravenous contrast. COMPARISON:  11/01/2023 FINDINGS: Brain: Punctate focus of restricted diffusion in the medial right temporal lobe. Extensive chronic infarction affecting the right MCA territory and to a lesser extent in the left temporoparietal cortex. Chronic blood products associated with these old infarcts. No acute hemorrhage. No hydrocephalus, mass, or collection. Vascular: Grossly preserved major flow voids. Skull and upper cervical spine: Focal marrow lesion. Sinuses/Orbits: No acute finding IMPRESSION: Small acute infarct in the anterior right temporal lobe. Extensive chronic infarction in the right more than left MCA distribution. Electronically Signed   By: Ronnette Coke M.D.   On: 05/06/2024 04:51   CT ANGIO HEAD NECK W WO CM Result Date: 05/05/2024 CLINICAL DATA:  Neuro deficit, acute, stroke suspected EXAM: CT ANGIOGRAPHY HEAD AND NECK WITH AND WITHOUT CONTRAST TECHNIQUE: Multidetector CT imaging of the head and neck was performed using the standard protocol during bolus administration of intravenous contrast. Multiplanar CT image reconstructions and MIPs were obtained to evaluate the vascular anatomy. Carotid stenosis measurements (when applicable) are obtained utilizing NASCET criteria, using the distal internal carotid diameter as the denominator. RADIATION DOSE REDUCTION: This exam was performed according to the departmental dose-optimization program which includes automated exposure control, adjustment of the mA and/or kV according to patient size and/or use of iterative reconstruction technique. CONTRAST:  75mL OMNIPAQUE   IOHEXOL  350 MG/ML SOLN COMPARISON:  MRI November 19, 24. FINDINGS: CT HEAD FINDINGS Brain: Multiple remote infarcts within the right MCA and posterior left MCA territories. No evidence of acute large vascular territory infarct, acute hemorrhage, mass lesion or midline shift. Vascular: See below. Skull: No acute fracture. Sinuses/Orbits: No acute finding. Review  of the MIP images confirms the above findings CTA NECK FINDINGS Aortic arch: Great vessel origins are patent without significant stenosis. Right carotid system: No evidence of dissection, stenosis (50% or greater), or occlusion. Left carotid system: No evidence of dissection, stenosis (50% or greater), or occlusion. Vertebral arteries: Left dominant. No evidence of dissection, stenosis (50% or greater), or occlusion. Skeleton: Negative. Other neck: No acute abnormality on limited assessment. Upper chest: No acute abnormality on limited assessment. Review of the MIP images confirms the above findings CTA HEAD FINDINGS Anterior circulation: Bilateral intracranial ICAs, MCAs, and ACAs are patent without proximal imaged please significant stenosis. Attenuated right MCA vessels in the region of prior infarct. Posterior circulation: Bilateral intradural vertebral arteries, basilar artery and bilateral posterior cerebral arteries are patent without proximal hemodynamically significant stenosis. Venous sinuses: As permitted by contrast timing, patent. Review of the MIP images confirms the above findings IMPRESSION: 1. No emergent large vessel occlusion or proximal hemodynamically significant stenosis. 2. Multiple remote infarcts within the right MCA and posterior left MCA territories. Electronically Signed   By: Stevenson Elbe M.D.   On: 05/05/2024 21:53    Vitals:   05/06/24 0600 05/06/24 0615 05/06/24 0630 05/06/24 0645  BP: 111/71 (!) 104/92    Pulse: 94 97 100 96  Resp: 16 13 16 16   Temp:      TempSrc:      SpO2: 100% 100% 100% 100%  Weight:      Height:         PHYSICAL EXAM General:  Alert, well-nourished, well-developed patient in no acute distress Psych:  Mood and affect appropriate for situation CV: Regular rate and rhythm on monitor Respiratory:  Regular, unlabored respirations on room air GI: Abdomen soft and nontender   NEURO:  Mental Status: Drowsy but alert.  Speech is nonfluent at  baseline.  She only says single words.  On exam, she asked for a blanket.  She did not answer any orientation questions.  Cranial Nerves:  II: PERRL. Visual fields full.  III, IV, VI: EOMI. Eyelids elevate symmetrically.  V: Sensation is intact to light touch  VII: Slight left facial droop VIII: hearing intact to voice. IX, X: Palate elevates symmetrically. ZO:XWRUEAVW shrug 5/5. XII: tongue is midline  Motor:  RUE/RLE: 4/5, no drift.  LUE: flaccid with increased tone, decreased bulk LLE: 3/5 increased tone  Sensation- decreased withdraw on LLE Coordination: UTA Gait- deferred  Most Recent NIH: 12.    ASSESSMENT/PLAN  CADE OLBERDING is a 38 y.o. 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 She had MRI Brain which demonstrated an acute R temporal lobe stroke.  NIH on Admission 16.  Acute Ischemic Infarct:  right temporal   Etiology:  cryptogenic versus Eliquis  failure  CT angio Head and Neck with contrast No emergent large vessel occlusion or proximal hemodynamically significant stenosis. Multiple remote infarcts within the right MCA and posterior left MCA territories. MRI Brain Small acute infarct in the anterior right temporal lobe.   2D Echo: Ordered 10/2023:EF 60 to 65% LDL 37 HgbA1c  5.0 Hypercoagulable Panel Negative 10/2023 VTE prophylaxis - Eliquis  Eliquis  (apixaban ) daily prior to admission, continue Eliquis  (apixaban ) daily  Therapy recommendations:  Pending Disposition:  pending  Hx of Stroke/TIA 08/2017 patient admitted for left sided numbness and headache.   MRI: right cerebellum, right temporoparietal small infarcts.  TCD bubble study trivial PFO. DVT negative.TEE no PFO  Discharged with aspirin  and Lipitor   01/2018 episode of left facial numbness and left leg weakness.   CT head and neck unremarkable.  MRI  negative. 06/2018 admitted to [facility] for right hand/face numbness.   MRI showed right tiny parietal infarct. 10/2020 admitted to [facility] for aphasia status post tPA.   CTA neck showed left M2 occlusion.  MRI showed left MCA and punctate right frontal infarct.   TCD bubble study potential trivial PFO, TEE tiny PFO. 11/2020 she was referred to loop recorder which was not done as physician thought she should be candidate for anticoagulation.   Started on Eliquis  5 mg twice daily. 04/02/2021 -05/15/2021 - Acute infarct of the R MCA due to right M1 occlusion s/p IR with TICI2c, cryptogenic etiology    Hypertension Home meds:  atenolol  Stable SBP goal - permissive hypertension first 24 h < 220/110, then gradually normalize  Hyperlipidemia Home meds:  Lipitor  40mg , Zetia  10mg  Resume in hospital once diet ordered LDL 37, goal < 70 Continue statin at discharge  Tobacco Abuse Former cigarette smoker  Dysphagia Patient has post-stroke dysphagia, SLP consulted    There are no active orders of the following types: Diet, Nourishments.   Advance diet as tolerated  Other Stroke Risk Factors Obesity, Body mass index is 42.91 kg/m., BMI >/= 30 associated with increased stroke risk, recommend weight loss, diet and exercise as appropriate  Migraines PTA Topiramate  150mg  BID  Other Active Problems Hx of Mood/Personality Disorder PTA meds: Olanzapine , buspirone , venlafaxine  Recently restarted these meds 5/21 after being out of them for a month  Hospital day # 0   Pt seen by Neuro NP/APP with MD. Note/plan to be edited by MD as needed.    Audrene Lease, DNP [facility] Please use AMION for contact information & EPIC for messaging.   To contact Stroke Continuity provider, please refer to [email or web address]. After hours, contact General Neurology

## 2024-05-06 NOTE — H&P (Signed)
 Date: 05/06/2024               Patient Name:  Krista Maddox MRN: [id number]  DOB: 12-Feb-1986 Age / Sex: 38 y.o., female   PCP: Benita Bramble, PA              Medical Service: Internal Medicine Teaching Service              Attending Physician: Dr. Bevelyn Bryant, MD    First Contact: Joretta Newborn, MS4 Pager: [phone]  Second Contact: Dr. Hoyt Macleod, DO Pager: [phone]       After Hours (After 5p/  First Contact Pager: [phone]  weekends / holidays): Second Contact Pager: [phone]   Chief Complaint: Headache and right sided weakness  History of Present Illness:  Krista Maddox is a 38 y.o. female with PMHx of cryptogenic strokes with residual left sided upper and lower extremity weakness, non-verbal at baseline, hard-of-hearing, TTP, DDD, narcicisstic personality disorder, unspecified mood disorder and migraines who presents with headaches and new right sided weakness.   History is limited due to patients aphasia, but she is able to read written text and respond with head nodes and single-words. She reports improvement in her headache as well as right-sided weakness since initial presentation. Denies any additional complaints. We shared the MRI results with her as well as the plan to work with neurology to optimize her medications. She would like us  to call her father Krista Maddox for additional information.  Attempted to call her father Krista Maddox [phone]) three times, went to voicemail each time. Spoke with her mother Krista Maddox [phone]) whom she lives with and was able to provide additional history. States patient is bed ridden, incontinent with both her bowels and urination. She is hard of hearing and can only mumble words at baseline. Said patient told her daughter yesterday morning that she had "thunder-like headache" and felt like she was having another stroke. She took tylenol  at home prior to EMS arrival. Mother also notes that she had checked patients depends  yesterday and there was no urine or bowel movement. She is unsure when her last BM was, sometime earlier in the week. Patient frequently refuses laxatives. Has a home health nurse practitioner that comes to the house every 3 months. She has taken all of her home medications daily, but has been out of her Olanzapine , Buspirone , Venlafaxine  and Topiramate  for a month and restarted on 05/02/24. Has never missed a dose of her other medications, including her Eliquis , Atorvastatin  or Ezetimibe . Patient had previously been in a skilled nursing facility, [facility], but was 2.5 hours away so was brought back home in December 2023. Mother is disabled herself and cannot drive and feels like she cannot take adequate care of her daughter at home and is currently seeking SNFs closer to home.   Meds:  Eliquis  5 mg BID Atenolol  25 mg daily Atorvastatin  40 mg daily Buspirone  5 mg daily Cyclobenzaprine 10 mg BID usually, sometimes TID Ezetimibe  10 mg daily Gabapentin  300 mg BID Olanzapine  2.5 mg nightly Topiramate  150 mg BID Venlafaxine  100 mg BID  Allergies: Allergies as of 05/05/2024 - Review Complete 05/05/2024  Allergen Reaction Noted   Tape Rash 10/11/2017   Past Medical History:  Diagnosis Date   DDD (degenerative disc disease), lumbar    Headache    Other disorders of optic nerve, not elsewhere classified, unspecified eye    Stroke (HCC)    Stroke (HCC)    TTP (thrombotic thrombocytopenic purpura) (HCC)  Family History: N/A  Social History:  Lives at home with her mother, step father and daughter Dependent in ADLs & iADls. Unable to ambulate, incontinent with bowel movements and urination PCP: Benita Bramble, PA  Review of Systems: A complete ROS was negative except as per HPI.   Physical Exam: Blood pressure 130/85, pulse 96, temperature 98.4 F (36.9 C), temperature source Oral, resp. rate 17, height 5\' 4"  (1.626 m), weight 113.4 kg, SpO2 100%. General: Laying comfortably in bed,  slumped to the right; in no acute distress.  HENT: Normal oropharynx and mucosa. Normal external appearance of ears and nose. Poor dentition.  Neck: Supple, no pain or tenderness  CV: RRR. No JVD. No peripheral edema.  Pulmonary: Symmetric Chest rise. Normal respiratory effort.  Abdomen: Soft to touch, non-tender.  Ext: No cyanosis, edema Skin: No rash. Normal palpation of skin.    Neurologic Exam:  Mental status/cognition: Alert and oriented to self Speech/language: responds in single words, non-fluent. Can read written and typed text and able to follow written commands Muscle bulk: poor in LUE and LLE RUE: 4/5 RLE: 4/5 LUE: 1/5 LLE: 1/5 EOM intact, no gaze preference, no nystagmus, tracks well in all directions.   Imaging in the ED: CXR: NSR with low voltage in precordial leads CT angio Head and Neck with contrast: IMPRESSION: 1. No emergent large vessel occlusion or proximal hemodynamically significant stenosis. 2. Multiple remote infarcts within the right MCA and posterior left MCA territories. MRI Brain: IMPRESSION: Small acute infarct in the anterior right temporal lobe. Extensive chronic infarction in the right more than left MCA distribution.  Labs in the ED: CBC with Hgb 9.1, MCV 74, normal differential. CMP with K 3.2. HgbA1c and lipid panel pending.  Assessment & Plan by Problem: Principal Problem:   Stroke [facility])  New right temporal lobe ischemic stroke Hx of cryptogenic stroke with residual L sided hemiparesis, nonverbal at baseline Patient presented with increased right-sided weakness and thunder-clap like headache. Patient is bed-ridden, non-verbal and incontinent of bowels and bladder at baseline. MRI brain in ED with small acute infarct in the anterior right temporal lobe with extensive chronic infarct in the R>L MCA.  Has been compliant with Eliquis , Atenolol , Atorvastatin  and Ezetimibe . Patient feels back at her baseline with resolution of her headache and  improvement in right sided weakness. Neurology on board, requested admission for further workup.  - Neurology consulted, appreciate recs - Continue Eliquis  5 mg BID - Telemetry - Follow-up Hgb A1c and lipid panel,  - Follow-up UA - Strict I&Os, daily weights - SLP eval ordered prior to PO intake - PT/OT ordered - Stoke team will continue to follow  Migraine headaches Per mother, patient reported "thunder-like" headache, worse than baseline migraines, at home prior to EMS arrival. Had not taken Topiramate  for a month, restarted on 5/21. Took tylenol  at home, now improved.  - Continue home Topiramate  150 mg BID - Tylenol  PRN  HTN Normotensive. Home meds held per Neurology recs. - Holding home Atenolol  - SBP goal - permissive hypertension first 24 h < 220/110   Unspecified mood disorder Personality Disorder Had ran out of her Olanzapine , Buspirone , Venlafaxine  for a month. Restarted all 3 on 5/21.  - Continue Olanzapine  2.5 at bedtime - Continue Buspirone  5 mg daily - Continue Venalfaxine 100 mg BID  Dispo: Admit patient to Inpatient with expected length of stay greater than 2 midnights.  Signed: Joretta Newborn, MS4  Attestation for Student Documentation:  I personally was present and re-performed  the history, physical exam and medical decision-making activities of this service and have verified that the service and findings are accurately documented in the student's note.  Cleven Dallas, DO 05/06/2024, 10:14 AM

## 2024-05-06 NOTE — Care Management Obs Status (Signed)
 MEDICARE OBSERVATION STATUS NOTIFICATION   Patient Details  Name: Krista Maddox MRN: [id number] Date of Birth: Oct 16, 1986   Medicare Observation Status Notification Given:  Yes   Patient could not hear, called father he verbally gave permission to sign that he received notice Ronni Colace, RN 05/06/2024, 9:20 AM

## 2024-05-06 NOTE — Progress Notes (Signed)
 SLP Cancellation Note  Patient Details Name: Krista Maddox MRN: [id number] DOB: [birth year]   Cancelled treatment:       Reason Eval/Treat Not Completed: SLP screened, no needs identified, will sign off  Pt passed [facility] swallow screen and nursing reported that she has been tolerating the diet without any symptoms of dysphagia; no formalized SLP swallow eval is needed per protocol; however, an acute infarct has been noted on MRI and a cognition-evaluation is therefore recommended per protocol.   Nikko Goldwire I. Valda Garnet, MS, CCC-SLP Acute Rehabilitation Services Office number [phone]  Toya Friar 05/06/2024, 2:48 PM

## 2024-05-06 NOTE — Progress Notes (Signed)
 Patient received from ED, is sleepy but able to be aroused, not able to answer orientation questions but said "cold,cold,cold", covered with warm blanket, follows some commands, vital signs stable, tele box connected, bed alarm on, will continue to monitor

## 2024-05-06 NOTE — Plan of Care (Signed)
  Problem: Clinical Measurements: Goal: Respiratory complications will improve Outcome: Progressing Goal: Cardiovascular complication will be avoided Outcome: Progressing   Problem: Coping: Goal: Level of anxiety will decrease Outcome: Progressing   Problem: Elimination: Goal: Will not experience complications related to urinary retention Outcome: Progressing   Problem: Skin Integrity: Goal: Risk for impaired skin integrity will decrease Outcome: Progressing

## 2024-05-06 NOTE — ED Notes (Addendum)
 Night staff stated that pt was awake all night and through the morning until she was cleared to eat and was fed her lunch by the tech.  Pt tolerated her meal well and did not show any difficulty swallowing.  After eating, pt fell asleep.  Pt able to be aroused, but is refusing to cooperate with neuro exam.

## 2024-05-06 NOTE — ED Notes (Signed)
 Pt confirmed that her brief did not need to be changed when asked.

## 2024-05-06 NOTE — Hospital Course (Addendum)
 Krista Maddox is a 38 y.o. female with a complex medical history of cryptogenic strokes with residual left-sided upper and lower extremity hemiparesis, non-verbal/aphasic at baseline, hard-of-hearing, HTN, HLD, TTP, DDD, schizoaffective disorder-depressive type, narcissistic personality disorder and migraines who presented with severe headache and right-sided weakness was admitted on 05/05/2024 for stroke workup after finding new right temporal lobe stroke.  New onset right sided weakness and headache Hx of cryptogenic stroke with residual L sided hemiparesis, nonverbal at baseline After presenting to the ED with severe headache and right-sided weakness she returned to base line after a few hours and remained there through admission.  She did have recurrent headaches but these were all consistent with her migraines and were responsive to Tylenol  or self resolved.  Per patients mother, etiology of initial stroke thought to be secondary to Depo-Provera  in the setting of migraines and tobacco use. At baseline, patient is bed-ridden, non-verbal and incontinent of bowels and bladder. Can read written text and respond with head nodes and single-word repetitive answers. Patient presented with two days of increased right-sided weakness and "thunder clap-like" headache. Brain MRI in ED showed a new acute small infarct in the anterior right temporal lobe with extensive chronic infarct in the R>L MCA.  Reported adherence with Eliquis , Atenolol , Atorvastatin  and Ezetimibe . Neurology requested admission for further workup. Hgb A1c was 4.5% and lipid panel was normal. Neurology followed throughout admission. She was continued on her home Eliquis  5 mg BID.  Discussed prior use of warfarin due to apparent Eliquis  failure in the past and they recommended continuing Eliquis  at this point as they think there was some adherence issues at home.  PT/OT saw her and recommended at left hand splint for a developing left hand/wrist  contracture, otherwise no acute needs were identified. She was continued on her home Flexeril  10 mg daily and Gabapentin  300 mg BID throughout admission.  She was discharged to [facility] for skilled nursing and has plans to be transferred to [facility] for long-term care after this.  Migraine headaches Per mother and daughter, patient reported "thunder-like" headache, worse than baseline migraines at home prior to EMS arrival. Had not taken Topiramate  for a month, restarted on 5/21. Headache resolved once IMTS saw her in ED and she continued to receive her home Toperimate throughout admission with some additional headaches that were not like her presenting headache and more responsive to Tylenol  or self resolved.  Iron  Deficiency Anemia Due to chronic blood loss and heavy menstrual periods.  Hemoglobin remained stable around 8-9.   Iron  panel on 5/26 with iron  of 15, TIBC elevated at 486, ferritin low at 6 and retic count 27.2 consistent with iron  deficiency anemia.  She received 200 mg of Venofer  IV iron  and then was started on ferrous sulfate  325 mg daily.   HTN Remained normotensive. Held home Atenolol  and did not resume.  HLD Lipid panel on admission was within normal limits. She was continued on her home Atorvastatin  40 mg daily and Ezetimibe  10 mg daily throughout admission.  Unspecified mood disorder Personality Disorder Had ran out of her Olanzapine , Buspirone , Venlafaxine  for a month. Restarted all three on 5/21 and continued throughout admission.

## 2024-05-06 NOTE — ED Notes (Signed)
 Pt bladder scan showed 9 ml of urine. Pt brief, and bed completely soiled. Linen changed, pt placed on clean bed pad and brief. Pt repositioned in bed with belongings in reach.

## 2024-05-07 ENCOUNTER — Inpatient Hospital Stay (HOSPITAL_COMMUNITY)

## 2024-05-07 DIAGNOSIS — I6389 Other cerebral infarction: Secondary | ICD-10-CM

## 2024-05-07 DIAGNOSIS — I639 Cerebral infarction, unspecified: Secondary | ICD-10-CM | POA: Diagnosis not present

## 2024-05-07 DIAGNOSIS — G43909 Migraine, unspecified, not intractable, without status migrainosus: Secondary | ICD-10-CM

## 2024-05-07 DIAGNOSIS — I69354 Hemiplegia and hemiparesis following cerebral infarction affecting left non-dominant side: Secondary | ICD-10-CM | POA: Diagnosis not present

## 2024-05-07 DIAGNOSIS — F39 Unspecified mood [affective] disorder: Secondary | ICD-10-CM

## 2024-05-07 DIAGNOSIS — I10 Essential (primary) hypertension: Secondary | ICD-10-CM | POA: Diagnosis not present

## 2024-05-07 DIAGNOSIS — R29712 NIHSS score 12: Secondary | ICD-10-CM

## 2024-05-07 LAB — ECHOCARDIOGRAM COMPLETE
AR max vel: 2.31 cm2
AV Peak grad: 6.4 mmHg
Ao pk vel: 1.26 m/s
Area-P 1/2: 4.86 cm2
Height: 64 in
S' Lateral: 2.5 cm
Weight: 4000 [oz_av]

## 2024-05-07 LAB — BASIC METABOLIC PANEL WITH GFR
Anion gap: 7 (ref 5–15)
BUN: 11 mg/dL (ref 6–20)
CO2: 19 mmol/L — ABNORMAL LOW (ref 22–32)
Calcium: 8.6 mg/dL — ABNORMAL LOW (ref 8.9–10.3)
Chloride: 109 mmol/L (ref 98–111)
Creatinine, Ser: 0.91 mg/dL (ref 0.44–1.00)
GFR, Estimated: >60 mL/min (ref >60)
Glucose, Bld: 81 mg/dL (ref 70–99)
Potassium: 3.9 mmol/L (ref 3.5–5.1)
Sodium: 135 mmol/L (ref 135–145)

## 2024-05-07 LAB — CBC
HCT: 31.6 % — ABNORMAL LOW (ref 36.0–46.0)
Hemoglobin: 9 g/dL — ABNORMAL LOW (ref 12.0–15.0)
MCH: 21.1 pg — ABNORMAL LOW (ref 26.0–34.0)
MCHC: 28.5 g/dL — ABNORMAL LOW (ref 30.0–36.0)
MCV: 74.2 fL — ABNORMAL LOW (ref 80.0–100.0)
Platelets: 238 10*3/uL (ref 150–400)
RBC: 4.26 MIL/uL (ref 3.87–5.11)
RDW: 17.8 % — ABNORMAL HIGH (ref 11.5–15.5)
WBC: 8.3 10*3/uL (ref 4.0–10.5)
nRBC: 0 % (ref 0.0–0.2)

## 2024-05-07 LAB — IRON AND TIBC
Iron: 15 ug/dL — ABNORMAL LOW (ref 28–170)
Saturation Ratios: 3 % — ABNORMAL LOW (ref 10.4–31.8)
TIBC: 486 ug/dL — ABNORMAL HIGH (ref 250–450)
UIBC: 471 ug/dL

## 2024-05-07 LAB — RAPID URINE DRUG SCREEN, HOSP PERFORMED
Amphetamines: NOT DETECTED
Barbiturates: NOT DETECTED
Benzodiazepines: POSITIVE — AB
Cocaine: NOT DETECTED
Opiates: NOT DETECTED
Tetrahydrocannabinol: NOT DETECTED

## 2024-05-07 LAB — URINALYSIS, ROUTINE W REFLEX MICROSCOPIC
Bilirubin Urine: NEGATIVE
Glucose, UA: NEGATIVE mg/dL
Hgb urine dipstick: NEGATIVE
Ketones, ur: NEGATIVE mg/dL
Leukocytes,Ua: NEGATIVE
Nitrite: NEGATIVE
Protein, ur: NEGATIVE mg/dL
Specific Gravity, Urine: 1.015 (ref 1.005–1.030)
pH: 8 (ref 5.0–8.0)

## 2024-05-07 LAB — RETICULOCYTES
Immature Retic Fract: 27.2 % — ABNORMAL HIGH (ref 2.3–15.9)
RBC.: 4.36 MIL/uL (ref 3.87–5.11)
Retic Count, Absolute: 71.1 10*3/uL (ref 19.0–186.0)
Retic Ct Pct: 1.6 % (ref 0.4–3.1)

## 2024-05-07 LAB — FERRITIN: Ferritin: 6 ng/mL — ABNORMAL LOW (ref 11–307)

## 2024-05-07 MED ORDER — PERFLUTREN LIPID MICROSPHERE
1.0000 mL | INTRAVENOUS | Status: AC | PRN
  Administered 2024-05-07: 2 mL via INTRAVENOUS

## 2024-05-07 NOTE — Plan of Care (Signed)

## 2024-05-07 NOTE — Progress Notes (Addendum)
 IMTS Cross Cover Note  Received message from bedside RN about patient's code status. Father at bedside stated patient was nodding no to the DNR bracelet to him. Discussed with patient via written communication regarding her code status. Patient able to tell me her name, place and year. She stated yes to CPR, compression, shock and breathing tube. Father states he is also POA, has forms at home. Patient points to father as her surrogate decision maker. Both patient and father in agreement for full code status. Will change order to reflect this and update primary team.   -Change Code Status: FULL Code   Jearldine Mina, DO Internal Medicine Resident PGY-2 Please contact the on-call pager after 5 pm and on weekends at [phone].

## 2024-05-07 NOTE — Evaluation (Signed)
 Occupational Therapy Evaluation Patient Details Name: Krista Maddox MRN: [id number] DOB: Dec 03, 1986 Today's Date: 05/07/2024   History of Present Illness   38 y.o. 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 MRI (+) small acute R temporal CVA.   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     Clinical Impressions Attempted to call Mom but did not get an answer. Face timed father with Krista Maddox for PLOF. At baseline, Krista Maddox lives with her Mom who assists with all ADL and mobility. Krista Maddox is able to feed herself with set up and assists with grooming. Family completes ADL, including toileting at bed level.  Family has a hoyer however they often  "lift her" into a chair. Dad states she spends a lot of time in the bed.  [name] using her iphone and was able to independently find her Dad's number and call him. After discussion with Dad, feel Krista Maddox is most likely close to her baseline. Echo in for procedure. Will return to further assess need for L hand splint.     If plan is discharge home, recommend the following:   Two people to help with walking and/or transfers;Two people to help with bathing/dressing/bathroom;Assistance with feeding;Direct supervision/assist for medications management;Direct supervision/assist for financial management;Assist for transportation;Help with stairs or ramp for entrance     Functional Status Assessment   Patient has not had a recent decline in their functional status     Equipment Recommendations   None recommended by OT     Recommendations for Other Services         Precautions/Restrictions   Precautions Precautions: Fall     Mobility Bed Mobility                    Transfers                   General transfer comment: Krista Maddox at baseline or fmaily lifts her into a chair       Balance                                           ADL either performed or assessed with clinical judgement   ADL Overall ADL's : Needs assistance/impaired     Grooming: Moderate assistance                                 General ADL Comments: Able to feed herself with set up; total A with other ADL tasks per father; bed level baths; uss brief for hygiene     Vision   Additional Comments: visual impairment at baseline most likely; appears to be able to read bold large black print on dry erase board     Perception         Praxis         Pertinent Vitals/Pain       Extremity/Trunk Assessment Upper Extremity Assessment Upper Extremity Assessment: Right hand dominant;LUE deficits/detail LUE Deficits / Details: hemiparetic; non-funcitonal ; increased tone distally with contracture developing L hand/wrist - flexor; able to release with pronation/supination and pressure at thenar area   Lower Extremity Assessment Lower Extremity Assessment: Defer to PT evaluation (contracture L ankle - plantarflexion adn inversion)  Communication Communication Communication: Impaired (Able to nod yes or no via whiteboard) Factors Affecting Communication: Difficulty expressing self;Reduced clarity of speech;Hearing impaired   Cognition Arousal: Alert Behavior During Therapy: WFL for tasks assessed/performed Cognition: No family/caregiver present to determine baseline, Cognition impaired             OT - Cognition Comments: most likely baseline; communicated with writing on a dry erase board                 Following commands: Impaired Following commands impaired: Follows one step commands inconsistently     Cueing  General Comments   Cueing Techniques: Gestural cues;Tactile cues;Visual cues  VSS   Exercises     Shoulder Instructions      Home Living Family/patient expects to be discharged to:: Private residence Living  Arrangements: Parent;Children;Other (Comment) Available Help at Discharge: Available 24 hours/day;Personal care attendant Type of Home: House Home Access: Ramped entrance     Home Layout: One level     Bathroom Shower/Tub: Sponge bathes at baseline (@ bed level)         Home Equipment: Hospital bed;Wheelchair - manual;Other (comment);BSC/3in1;Rolling Environmental consultant (2 wheels)          Prior Functioning/Environment Prior Level of Function : Needs assist       Physical Assist : Mobility (physical);ADLs (physical) Mobility (physical): Bed mobility;Transfers ADLs (physical): Feeding;Grooming;Bathing;Dressing;Toileting;IADLs Mobility Comments: Dad states she mostly stays in bed; father lifts her to the chair; family uses hoyer lift as well ADLs Comments: Per chart reveiw has PCA; PT ableto help with self feeidngafter set up    OT Problem List: Decreased strength;Decreased range of motion;Decreased activity tolerance;Impaired vision/perception;Decreased coordination;Impaired tone;Impaired sensation;Obesity;Impaired UE functional use;Pain   OT Treatment/Interventions: Splinting;Patient/family education      OT Goals(Current goals can be found in the care plan section)   Acute Rehab OT Goals Patient Stated Goal: unable to state OT Goal Formulation: With patient/family Time For Goal Achievement: 05/21/24 Potential to Achieve Goals: Fair   OT Frequency:  Min 1X/week    Co-evaluation              AM-PAC OT "6 Clicks" Daily Activity     Outcome Measure Help from another person eating meals?: A Little Help from another person taking care of personal grooming?: A Little Help from another person toileting, which includes using toliet, bedpan, or urinal?: Total Help from another person bathing (including washing, rinsing, drying)?: Total Help from another person to put on and taking off regular upper body clothing?: Total Help from another person to put on and taking off regular  lower body clothing?: Total 6 Click Score: 10   End of Session Nurse Communication: Mobility status;Other (comment) (communicatioin)  Activity Tolerance: Patient tolerated treatment well Patient left: in bed;with call bell/phone within reach;with bed alarm set  OT Visit Diagnosis: Other abnormalities of gait and mobility (R26.89);Muscle weakness (generalized) (M62.81);Other symptoms and signs involving cognitive function;Hemiplegia and hemiparesis;Pain Hemiplegia - Right/Left: Left Hemiplegia - dominant/non-dominant: Non-Dominant Hemiplegia - caused by: Cerebral infarction (prior CVA) Pain - Right/Left: Left Pain - part of body: Hand (wiht ROM)                Time: 1610-9604 OT Time Calculation (min): 13 min Charges:  OT General Charges $OT Visit: 1 Visit OT Evaluation $OT Eval Moderate Complexity: 1 Mod  Emmerson Taddei, OT/L   Acute OT Clinical Specialist Acute Rehabilitation Services Pager [phone] Office [phone]   [facility] 05/07/2024, 4:35 PM

## 2024-05-07 NOTE — TOC CAGE-AID Note (Signed)
 Transition of Care [facility]) - CAGE-AID Screening   Patient Details  Name: Krista Maddox MRN: [id number] Date of Birth: 1986-07-24  [facility]) CM/SW Contact:    Grizel Vesely E Janay Canan, LCSW Phone Number: 05/07/2024, 9:50 AM   Clinical Narrative:    CAGE-AID Screening: Substance Abuse Screening unable to be completed due to: : Patient unable to participate (memory impairment)

## 2024-05-07 NOTE — Progress Notes (Addendum)
                 Subjective HA is a little better. Denies pain. She is comfortable.  Physical exam Blood pressure 101/64, pulse 89, temperature 98.7 F (37.1 C), temperature source Oral, resp. rate 18, height 5\' 4"  (1.626 m), weight 113.4 kg, SpO2 99%.  General: Laying comfortably in bed, in no acute distress.  CV: RRR Pulmonary: Symmetric Chest rise. Normal respiratory effort.  Neurological: Tracks with eyes, head nods when answering questions   Weight change:    Intake/Output Summary (Last 24 hours) at 05/07/2024 0718 Last data filed at 05/06/2024 4098 Gross per 24 hour  Intake 60 ml  Output --  Net 60 ml   Net IO Since Admission: 60 mL [05/07/24 0718]  Labs, images, and other studies    Latest Ref Rng & Units 05/07/2024    6:20 AM 05/05/2024    7:36 PM 05/05/2024    7:20 PM  CBC  WBC 4.0 - 10.5 K/uL 8.3   7.6   Hemoglobin 12.0 - 15.0 g/dL 9.0  11.9  9.1   Hematocrit 36.0 - 46.0 % 31.6  32.0  33.0   Platelets 150 - 400 K/uL 238   245        Latest Ref Rng & Units 05/05/2024    7:36 PM 05/05/2024    7:20 PM 11/02/2023    3:12 AM  BMP  Glucose 70 - 99 mg/dL 85  88  83   BUN 6 - 20 mg/dL 8  8  11    Creatinine 0.44 - 1.00 mg/dL 1.47  8.29  5.62   Sodium 135 - 145 mmol/L 139  138  140   Potassium 3.5 - 5.1 mmol/L 3.4  3.2  3.7   Chloride 98 - 111 mmol/L 103  104  111   CO2 22 - 32 mmol/L  22  22   Calcium  8.9 - 10.3 mg/dL  8.8  8.4      Assessment and plan Hospital day 1  Krista Maddox is a 38 y.o.female with history cryptogenic strokes with residual left sided upper and lower extremity weakness, non-verbal at baseline, hard-of-hearing, TTP, DDD, unspecified mood disorder and migraines who presents with headaches and new right sided weakness. Admitted for new right temporal lobe ischemic stroke.  New right temporal lobe ischemic stroke Hx of cryptogenic stroke with residual L sided hemiparesis, nonverbal at baseline Neurology following, appreciate  recommendations. Lipid panel/A1c unremarkable. Hypercoagulability work-up in 2022 unremarkable. Spoke with her mother who states she believes original stroke was attributed to Depo-Provera . Previously on Coumadin  as well but has been on Eliquis  recently. Unclear what prompted the change. She passed swallow screen yesterday. - Continue Eliquis  5 mg BID - Telemetry - PT/OT ordered - Stoke team will continue to follow   Migraine headaches - Continue home Topiramate  150 mg BID - Tylenol  PRN   HTN Normotensive. Home meds held per Neurology recs. - Holding home Atenolol    Unspecified mood disorder Personality Disorder Had ran out of her Olanzapine , Buspirone , Venlafaxine  for a month. Restarted all 3 on 5/21.  - Continue Olanzapine  2.5 at bedtime - Continue Buspirone  5 mg daily - Continue Venalfaxine 100 mg BID  Disposition Mother would like patient at a facility closer to home. Will await PT/OT recommendation and reach out to [facility].  Krista Colace, DO 05/07/2024, 7:18 AM  Pager: [phone] After 5pm or weekend: [phone]

## 2024-05-07 NOTE — Progress Notes (Signed)
 Echocardiogram 2D Echocardiogram has been performed.  Krista Maddox 05/07/2024, 2:28 PM

## 2024-05-07 NOTE — Progress Notes (Addendum)
 STROKE TEAM PROGRESS NOTE   5/24- with headache and some R sided weakness. MRI Brain which demonstrated an acute R temporal lobe stroke.   INTERIM HISTORY/SUBJECTIVE Seen in room. Able to set up lunch with assistance. Eliquis  resumed PT/OT pending   OBJECTIVE  CBC    Component Value Date/Time   WBC 8.3 05/07/2024 0620   RBC 4.26 05/07/2024 0620   HGB 9.0 (L) 05/07/2024 0620   HGB 13.9 07/26/2018 1610   HCT 31.6 (L) 05/07/2024 0620   HCT 41.5 07/26/2018 1610   PLT 238 05/07/2024 0620   PLT 244 07/26/2018 1610   MCV 74.2 (L) 05/07/2024 0620   MCV 94 07/26/2018 1610   MCH 21.1 (L) 05/07/2024 0620   MCHC 28.5 (L) 05/07/2024 0620   RDW 17.8 (H) 05/07/2024 0620   RDW 13.2 07/26/2018 1610   LYMPHSABS 2.6 05/05/2024 1920   LYMPHSABS 2.9 07/26/2018 1610   MONOABS 0.5 05/05/2024 1920   EOSABS 0.1 05/05/2024 1920   EOSABS 0.2 07/26/2018 1610   BASOSABS 0.0 05/05/2024 1920   BASOSABS 0.0 07/26/2018 1610    BMET    Component Value Date/Time   NA 135 05/07/2024 0620   NA 141 07/26/2018 1610   K 3.9 05/07/2024 0620   CL 109 05/07/2024 0620   CO2 19 (L) 05/07/2024 0620   GLUCOSE 81 05/07/2024 0620   BUN 11 05/07/2024 0620   BUN 10 07/26/2018 1610   CREATININE 0.91 05/07/2024 0620   CALCIUM  8.6 (L) 05/07/2024 0620   GFRNONAA >60 05/07/2024 1610    IMAGING past 24 hours No results found.   Vitals:   05/06/24 2344 05/07/24 0428 05/07/24 0740 05/07/24 1156  BP: 109/89 101/64 94/71 (!) 135/91  Pulse: 91 89 88 (!) 103  Resp: 18 18 19 18   Temp: (!) 97.5 F (36.4 C) 98.7 F (37.1 C) 98.8 F (37.1 C) 99.1 F (37.3 C)  TempSrc: Oral Oral Oral Oral  SpO2: 100% 99% 98% 100%  Weight:      Height:         PHYSICAL EXAM General:  Alert, well-nourished, well-developed patient in no acute distress Psych:  Mood and affect appropriate for situation CV: Regular rate and rhythm on monitor Respiratory:  Regular, unlabored respirations on room air GI: Abdomen soft and  nontender   NEURO:  Mental Status: Drowsy but alert.  Speech is nonfluent at baseline.  She only says single words.  On exam, she asked for a blanket.  She did not answer any orientation questions.  Cranial Nerves:  II: PERRL. Visual fields full.  III, IV, VI: EOMI. Eyelids elevate symmetrically.  V: Sensation is intact to light touch  VII: Slight left facial droop VIII: hearing intact to voice. IX, X: Palate elevates symmetrically. RU:[id number] shrug 5/5. XII: tongue is midline  Motor:  RUE/RLE: 4/5, no drift.  LUE: flaccid with increased tone, decreased bulk LLE: 3/5 increased tone  Sensation- decreased withdraw on LLE Coordination: UTA Gait- deferred  Most Recent NIH: 12.    ASSESSMENT/PLAN  Krista Maddox is a 38 y.o. 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 She had MRI Brain which demonstrated an acute R temporal lobe stroke.  NIH on Admission 16.  Stroke, incidental finding:  right temporal small infarct, etiology:  cryptogenic versus Eliquis  failure  Admitted for headache and right-sided weakness CT head multiple remote infarcts within the right  MCA and posterior left MCA territories CT angio Head and Neck no LVO MRI Brain Small acute infarct in the anterior right temporal lobe.  2D Echo: EF 65-70% LDL 37 HgbA1c 5.0 Hypercoagulable Panel Negative 10/2023 VTE prophylaxis - Eliquis  Eliquis  (apixaban ) daily prior to admission, continue Eliquis  (apixaban ) daily  Therapy recommendations: None Disposition:  pending  Hx of Stroke/TIA In 08/2017 patient admitted for left sided numbness and headache.  MRI showed right cerebellum, right temporoparietal small infarcts.  EF 55 to 60%, CT head and neck unremarkable.  LDL 89, A1c 4.8.  TCD bubble study showed trivial PFO.  DVT negative.  TEE no PFO.  Hypercoagulable and  autoimmune work-up negative.  Discharged with aspirin  and Lipitor . 01/2018 episode of left facial numbness and left leg weakness.  CT head and neck unremarkable.  MRI negative. 06/2018 admitted to [facility] for right hand/face numbness.  MRI showed right tiny parietal infarct. 10/2020 admitted to [facility] for aphasia status post tPA.  CTA neck showed left M2 occlusion.  MRI showed left MCA and punctate right frontal infarct.  TCD bubble study potential trivial PFO, TEE tiny PFO. 11/2020 she was referred to loop recorder which was not done as physician thought she should be candidate for anticoagulation.  Started on Eliquis  5 mg twice daily. 04/02/2021 -05/15/2021 - Acute infarct of the R MCA due to right M1 occlusion s/p IR with TICI2c, cryptogenic etiology.  CT head acute infarct in the right MCA territory, ASPECT score 8.  CT head and neck right M1 occlusion with partial reconstitution and subsequently reocclusion of most M2 branches.  CT perfusion core infarct 99 cc and penumbra 34 cc.  EF 60 to 65%, LDL 48, A1c 5.7.  Put on Coumadin  with INR goal 2.5-3.5.  Continued Lipitor  80 and Zetia  10. 10/2023 admitted for right upper extremity weakness and numbness.  MRI negative.  CT extensive right MCA encephalomalacia.  CT head and neck showed moderate stenosis right M1.  EEG no seizure.  EF 60 to 65%, LDL 37, A1c 5.0.  Consider recrudescence of previous stroke.  Continue Eliquis .  Hypertension Home meds:  atenolol  Stable Long-term BP goal normotensive  Hyperlipidemia Home meds:  Lipitor  40mg , Zetia  10mg  Resume in hospital LDL 37, goal < 70 Continue statin at discharge  Other Stroke Risk Factors Former smoker Obesity, Body mass index is 42.91 kg/m., BMI >/= 30 associated with increased stroke risk, recommend weight loss, diet and exercise as appropriate  Migraines, PTA Topiramate  150mg  BID  Other Active Problems Hx of Mood/Personality Disorder PTA meds: Olanzapine , buspirone , venlafaxine  Recently  restarted these meds 5/21 after being out of them for a month  Hospital day # 1  Patient seen and examined by NP/APP with MD. MD to update note as needed.   Imogene Mana, DNP, FNP-BC [facility] Pager: [phone]  ATTENDING NOTE: I reviewed above note and agree with the assessment and plan. Pt was seen and examined.   No family at bedside.  Patient had multiple history of stroke in the past with cryptogenic etiology.  On Eliquis  PTA.  This time admitted for altered mental status and right-sided weakness, however MRI only showed right small/punctate temporal pole infarct, more likely incidental finding.  Continue Eliquis , continue statin and Zetia .  PT/OT no recommendation.  For detailed assessment and plan, please refer to above as I have made changes wherever appropriate.   Neurology will sign off. Please call with questions. Pt will follow up with stroke clinic Dr. Janett Medin at [facility] in about  4 weeks. Thanks for the consult.   Consuelo Denmark, MD PhD Stroke Neurology 05/07/2024 5:39 PM    To contact Stroke Continuity provider, please refer to [email or web address]. After hours, contact General Neurology

## 2024-05-07 NOTE — Evaluation (Signed)
 Physical Therapy Brief Evaluation and Discharge Note Patient Details Name: Krista Maddox MRN: [id number] DOB: 1986/03/09 Today's Date: 05/07/2024   History of Present Illness  38 y.o. 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 MRI (+) small acute R temporal CVA.   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  Clinical Impression  Pt presents with admitting diagnosis above. Pt today was able to roll on her L side with supervision when given a written command on whiteboard. At baseline, it appears that pt is nonverbal and is mostly bedbound using a hoyer lift for all mobility. Pt presents at or near baseline mobility. Pt has no further acute PT needs and will be signing off. Re consult PT if mobility status changes.        PT Assessment Patient does not need any further PT services  Assistance Needed at Discharge  Frequent or constant Supervision/Assistance    Equipment Recommendations None recommended by PT  Recommendations for Other Services       Precautions/Restrictions Precautions Precautions: Fall Restrictions Weight Bearing Restrictions Per Provider Order: No        Mobility  Bed Mobility Rolling: Supervision     General bed mobility comments: Able to roll when given written command on white board  Transfers                   General transfer comment: Hoyer at baseline    Ambulation/Gait              Home Activity Instructions    Stairs            Modified Rankin (Stroke Patients Only) Modified Rankin (Stroke Patients Only) Pre-Morbid Rankin Score: Severe disability Modified Rankin: Severe disability      Balance                          Pertinent Vitals/Pain   Pain Assessment Pain Assessment: Faces Faces Pain Scale: Hurts a little bit Pain Location: L wrist Pain Descriptors /  Indicators: Grimacing, Discomfort Pain Intervention(s): Monitored during session     Home Living Family/patient expects to be discharged to:: Private residence Living Arrangements: Parent;Children;Other (Comment) Available Help at Discharge: Family;Available 24 hours/day;Personal care attendant Home Environment: Ramped entrance   Home Equipment: Hospital bed;Wheelchair - manual;Other (comment);BSC/3in1;Rolling Environmental consultant (2 wheels)        Prior Function Level of Independence: Needs assistance Comments: Mostly bed bound. Hoyer lift at baseline    UE/LE Assessment   UE ROM/Strength/Tone/Coordination: Impaired UE ROM/Strength/Tone/Coordination Deficits: LUE residual deficits  LE ROM/Strength/Tone/Coordination: Impaired LE ROM/Strength/Tone/Coordination Deficits: LLE residual deficits    Communication   Communication Communication: Impaired (Able to nod yes or no via whiteboard) Factors Affecting Communication: Difficulty expressing self;Reduced clarity of speech     Cognition Overall Cognitive Status: No family/caregiver present to determine baseline cognitive functioning       General Comments General comments (skin integrity, edema, etc.): VSS    Exercises     Assessment/Plan    PT Problem List         PT Visit Diagnosis Other abnormalities of gait and mobility (R26.89)    No Skilled PT Patient at baseline level of functioning   Co-evaluation                AMPAC 6 Clicks Help needed turning from your back  to your side while in a flat bed without using bedrails?: A Little Help needed moving from lying on your back to sitting on the side of a flat bed without using bedrails?: Total Help needed moving to and from a bed to a chair (including a wheelchair)?: Total Help needed standing up from a chair using your arms (e.g., wheelchair or bedside chair)?: Total Help needed to walk in hospital room?: Total Help needed climbing 3-5 steps with a railing? :  Total 6 Click Score: 8      End of Session   Activity Tolerance: Patient tolerated treatment well Patient left: in bed;with call bell/phone within reach;with bed alarm set Nurse Communication: Mobility status PT Visit Diagnosis: Other abnormalities of gait and mobility (R26.89)     Time: 1610-9604 PT Time Calculation (min) (ACUTE ONLY): 15 min  Charges:   PT Evaluation $PT Eval Moderate Complexity: 1 Mod      Matraca Hunkins B, PT, DPT Acute Rehab Services [phone]   Clance Baquero  05/07/2024, 4:19 PM

## 2024-05-07 NOTE — Progress Notes (Signed)
 Occupational Therapy Treatment Note  Pt seen partial session with PT to further assess mobility and need for L hand splint. Pt able to follow written command on dry erase board to roll toward L. Pt with developing L hand/wrist flexion contracture. Will order and modify prefab resting hand splint in the am - received VO from neuro PA. Aaron Aas   Milburn Aliment, OT/L   Acute OT Clinical Specialist Acute Rehabilitation Services Pager [phone] Office [phone]

## 2024-05-08 LAB — CBC
HCT: 28.7 % — ABNORMAL LOW (ref 36.0–46.0)
Hemoglobin: 8.3 g/dL — ABNORMAL LOW (ref 12.0–15.0)
MCH: 21 pg — ABNORMAL LOW (ref 26.0–34.0)
MCHC: 28.9 g/dL — ABNORMAL LOW (ref 30.0–36.0)
MCV: 72.5 fL — ABNORMAL LOW (ref 80.0–100.0)
Platelets: 228 10*3/uL (ref 150–400)
RBC: 3.96 MIL/uL (ref 3.87–5.11)
RDW: 17.8 % — ABNORMAL HIGH (ref 11.5–15.5)
WBC: 7.2 10*3/uL (ref 4.0–10.5)
nRBC: 0 % (ref 0.0–0.2)

## 2024-05-08 MED ORDER — IRON SUCROSE 200 MG IVPB - SIMPLE MED
200.0000 mg | Status: DC
  Administered 2024-05-08: 200 mg via INTRAVENOUS
  Filled 2024-05-08: qty 200
  Filled 2024-05-08: qty 110

## 2024-05-08 MED ORDER — POLYETHYLENE GLYCOL 3350 17 G PO PACK
17.0000 g | PACK | Freq: Every day | ORAL | Status: DC
  Administered 2024-05-08 – 2024-05-13 (×5): 17 g via ORAL
  Filled 2024-05-08 (×7): qty 1

## 2024-05-08 MED ORDER — SENNOSIDES-DOCUSATE SODIUM 8.6-50 MG PO TABS
1.0000 | ORAL_TABLET | Freq: Two times a day (BID) | ORAL | Status: DC
  Administered 2024-05-08 – 2024-05-13 (×10): 1 via ORAL
  Filled 2024-05-08 (×10): qty 1

## 2024-05-08 NOTE — Plan of Care (Signed)

## 2024-05-08 NOTE — Plan of Care (Signed)
  Problem: Education: Goal: Knowledge of General Education information will improve Description: Including pain rating scale, medication(s)/side effects and non-pharmacologic comfort measures Outcome: Progressing   Problem: Clinical Measurements: Goal: Cardiovascular complication will be avoided Outcome: Progressing   Problem: Activity: Goal: Risk for activity intolerance will decrease Outcome: Progressing   Problem: Coping: Goal: Level of anxiety will decrease Outcome: Progressing   Problem: Elimination: Goal: Will not experience complications related to urinary retention Outcome: Progressing   Problem: Safety: Goal: Ability to remain free from injury will improve Outcome: Progressing   Problem: Skin Integrity: Goal: Risk for impaired skin integrity will decrease Outcome: Progressing

## 2024-05-08 NOTE — TOC Initial Note (Signed)
 Transition of Care [facility]) - Initial/Assessment Note    Patient Details  Name: Krista Maddox MRN: [id number] Date of Birth: 1986/01/30  Transition of Care [facility] CM/SW Contact:    Jonathan Neighbor, RN Phone Number: 05/08/2024, 2:37 PM  Clinical Narrative:                  Patient is hearing impaired. CM was able to write her a note asking if it was ok to call her mom. Pt in agreement. Pts mother is asking for long term placement for her daughter. Pt was in a LT care at one point but it was 2 hours aware per mom and they hardly got to see her. Family decided to try bringing her home. Mom says she herself is disabled and can no longer provide the care that the patient needs at home. Mom is hoping for a LT bed either in [facility] or [place].  CM has asked the pts mother to get LT care medicaid started with social services. She agreed.  CM will fax her out for LT care in the 2 counties.  TOC following.  Expected Discharge Plan: Long Term Nursing Home Barriers to Discharge: Continued Medical Work up   Patient Goals and CMS Choice   CMS Medicare.gov Compare Post Acute Care list provided to:: Patient Represenative (must comment) Choice offered to / list presented to : Parent      Expected Discharge Plan and Services In-house Referral: Clinical Social Work   Post Acute Care Choice: Nursing Home Living arrangements for the past 2 months: Single Family Home                                      Prior Living Arrangements/Services Living arrangements for the past 2 months: Single Family Home Lives with:: Parents Patient language and need for interpreter reviewed:: Yes Do you feel safe going back to the place where you live?: Yes        Care giver support system in place?: Yes (comment)   Criminal Activity/Legal Involvement Pertinent to Current Situation/Hospitalization: No - Comment as needed  Activities of Daily Living      Permission Sought/Granted                   Emotional Assessment Appearance:: Appears stated age   Affect (typically observed): Accepting Orientation: : Oriented to Self, Oriented to Place, Oriented to  Time, Oriented to Situation   Psych Involvement: No (comment)  Admission diagnosis:  Hypokalemia [E87.6] Microcytic anemia [D50.9] Stroke (HCC) [I63.9] Acute nonintractable headache, unspecified headache type [R51.9] Cerebrovascular accident (CVA), unspecified mechanism (HCC) [I63.9] Patient Active Problem List   Diagnosis Date Noted   Schizoaffective disorder (HCC)- depressive type 05/06/2024   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 (HCC) 05/06/2024   Essential hypertension 05/06/2024   Acute left-sided weakness 11/02/2023   Acute ischemic right MCA stroke (HCC) 04/02/2021   Stroke (HCC) 04/02/2021   Middle cerebral artery embolism, right 04/02/2021   Ptosis of left eyelid 09/18/2019   Speech disturbance 02/28/2019   Paresthesia 02/28/2019   Cerebrovascular accident (HCC) 11/02/2017   Cerebrovascular accident (CVA) due to thrombosis of anterior cerebral artery (HCC)    History of TTP (thrombotic thrombocytopenic purpura)    Cerebral embolism with cerebral infarction 08/17/2017   Numbness on left side 08/16/2017   Acute left lumbar radiculopathy 10/13/2016   Chronic migraine  06/01/2016   Pseudotumor cerebri 11/03/2015   DENTAL PAIN 06/09/2010   HEADACHE, TENSION 02/04/2010   SCABIES 01/13/2010   FOOT PAIN, LEFT 01/13/2010   CERUMEN IMPACTION, BILATERAL 11/24/2009   PSORIASIS 11/24/2009   Smoker 10/15/2009   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   PYELONEPHRITIS 10/01/2009   THROMBOTIC THROMBOCYTOPENIC PURPURA 12/13/2005   PCP:  Benita Bramble, PA Pharmacy:   [facility] [phone] Georgeana Kindler, [place] - [address] AT [facility] [address] [place] [address] Phone: [phone] Fax: [phone]     Social  Drivers of Health (SDOH) Social History: SDOH Screenings   Food Insecurity: Patient Declined (05/07/2024)  Housing: Patient Unable To Answer (05/07/2024)  Transportation Needs: Patient Unable To Answer (05/07/2024)  Utilities: Not At Risk (03/04/2023)   Received from [facility], [facility]  Financial Resource Strain: Medium Risk (03/04/2023)   Received from [facility], [facility]  Physical Activity: Unknown (03/04/2023)   Received from [facility], [facility]  Social Connections: Socially Integrated (03/04/2023)   Received from [facility], [facility]  Stress: No Stress Concern Present (03/04/2023)   Received from [facility], [facility]  Tobacco Use: Medium Risk (05/05/2024)   SDOH Interventions:     Readmission Risk Interventions    11/03/2023    3:44 PM  Readmission Risk Prevention Plan  Post Dischage Appt Complete  Medication Screening Complete  Transportation Screening Complete

## 2024-05-08 NOTE — Progress Notes (Addendum)
 Subjective:  Patient is doing ok this morning. Endorses continued weakness in her right arm and leg, but improved from admission. Her HA is improved. She does endorse some Sob, but this was present prior to admission, is not present when she is laying down. She is eating and drinking without issue as well as urinating and moving her bowels.   Objective:  Vital signs in last 24 hours: Vitals:   05/08/24 0008 05/08/24 0430 05/08/24 0451 05/08/24 0740  BP: 103/68 101/77  107/73  Pulse: 87 78  91  Resp: 18 16  17   Temp: 99.1 F (37.3 C) 98.4 F (36.9 C)  98.7 F (37.1 C)  TempSrc:      SpO2: 97% 99%  97%  Weight:   116.3 kg   Height:       Weight change:   Intake/Output Summary (Last 24 hours) at 05/08/2024 0758 Last data filed at 05/07/2024 1800 Gross per 24 hour  Intake 200 ml  Output 850 ml  Net -650 ml   General: Laying comfortably in bed, in no acute distress.  CV: RRR. No LE edema. Pulmonary: Symmetric Chest rise. Normal respiratory effort. Lungs clear to auscultation anteriorly. 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 Admitted for new right temporal lobe ischemic stroke.   New right temporal lobe ischemic stroke Hx of cryptogenic stroke with residual L sided hemiparesis, nonverbal at baseline Neurology & stroke team following,  appreciate recommendations. Lipid panel/A1c unremarkable. Hypercoagulability work-up in 2022 unremarkable. Spoke with her mother who states she believes original stroke was attributed to Depo-Provera . Previously on Coumadin  as well but has been on Eliquis  recently (since 2022). Unclear what prompted the change. Weakness in right upper and lower improving, but not yet back to baseline.  - On Eliquis  5 mg BID, reached out to Neurology who confirmed this is the best regimen for future stroke prevention - Telemetry - Continue home Flexeril 10 mg daily - Continue home Gabapentin  300 mg BID - [place] team will continue to follow   Migraine headaches - Continue home Topiramate  150 mg BID - Tylenol  PRN  Iron deficiency anemia Per mother, patient has heavy menstrual periods. Hgb 8.3 this AM. Iron panel yesterday with iron 15, TIBC elevated to 486, ferritin 6, retic 27.2 consistent with IDA.  - IV Venofer ordered   HTN Normotensive. Home meds held per Neurology recs. - Holding home Atenolol    Unspecified mood disorder Personality Disorder Had ran out of her Olanzapine , Buspirone , Venlafaxine  for a month. Restarted all 3 on 5/21.  - Continue Olanzapine  2.5 at bedtime - Continue Buspirone  5 mg daily - Continue Venalfaxine 100 mg BID   Disposition PT: only able to partially assess yesterday, but did rec left hand splint for developing L hand/wrist contracture OT: no recs Mother would like patient at a facility closer to home. TOC was made aware yesterday and is following.   LOS: 2 days   Dreon Pineda, [place]  R, Medical Student 05/08/2024, 7:58 AM

## 2024-05-08 NOTE — Progress Notes (Signed)
 Occupational Therapy Treatment Patient Details Name: Krista Maddox MRN: [id number] DOB: Mar 12, 1986 Today's Date: 05/08/2024   History of present illness 38 y.o. 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 MRI (+) small acute R temporal CVA.   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   OT comments  Modified L resting hand splint to accommodate for wrist flexion contracture. Improved fit after modification. Will establish wear schedule and further assess need for modification. Will attempt to Face Time family with Calvin if not present to begin education regarding splint management. Discussed with CM who states Lovelee's Mom is no longer able to provide necessary level of care as Lizbeth is total A for transfers and ADL tasks with the exception of feeding and grooming. Pt will need LTC.  Quilla communicates best with simple written information on her white board.       If plan is discharge home, recommend the following:  Two people to help with walking and/or transfers;Two people to help with bathing/dressing/bathroom;Assistance with feeding;Direct supervision/assist for medications management;Direct supervision/assist for financial management;Assist for transportation;Help with stairs or ramp for entrance   Equipment Recommendations  None recommended by OT    Recommendations for Other Services      Precautions / Restrictions Precautions Precautions: Fall        ADL either performed or assessed with clinical judgement   ADL                                         General ADL Comments: at baseline per family; able to feed self with intermittent S after set up; assist with simple grooming tasks    Extremity/Trunk Assessment Upper Extremity Assessment Upper Extremity Assessment: Right hand dominant LUE Deficits / Details:  hemiparetic; non-funcitonal ; increased tone distally with contracture developing L hand/wrist - flexor; able to release with pronation/supination and pressure at thenar area; able to achieve wrist at neutrail with MPs @ 45 flexion and IPs extended            Vision       Perception     Praxis     Communication Communication Communication: Impaired Factors Affecting Communication: Difficulty expressing self;Reduced clarity of speech; Hearing impaired   Cognition Arousal: Alert Behavior During Therapy: WFL for tasks assessed/performed Cognition: Cognition impaired, No family/caregiver present to determine baseline             OT - Cognition Comments: most likely baseline; using her phone to contact family; scrolling through phone; reading information on white board for communication                 Following commands: Impaired        Cueing      Exercises Other Exercises Other Exercises: PROM L hand/wrist after inhibition of  tone    Shoulder Instructions       General Comments L resting hand splint modified to improve fit.    Pertinent Vitals/ Pain       Pain Assessment Pain Assessment: Faces Faces Pain Scale: Hurts a little bit Pain Location: L wrist (with ROM) Pain Descriptors / Indicators: Grimacing, Discomfort Pain Intervention(s): Limited activity within patient's tolerance  Home Living  Prior Functioning/Environment              Frequency  Min 1X/week        Progress Toward Goals  OT Goals(current goals can now be found in the care plan section)  Progress towards OT goals: Progressing toward goals  Acute Rehab OT Goals Patient Stated Goal: unable to state OT Goal Formulation: With patient/family Time For Goal Achievement: 05/21/24 Potential to Achieve Goals: Fair ADL Goals Additional ADL Goal #1: caregiver will independently manage L resting hand splint/positioning to  reduce further contracture development  Plan      Co-evaluation                 AM-PAC OT "6 Clicks" Daily Activity     Outcome Measure   Help from another person eating meals?: A Little Help from another person taking care of personal grooming?: A Little Help from another person toileting, which includes using toliet, bedpan, or urinal?: Total Help from another person bathing (including washing, rinsing, drying)?: Total Help from another person to put on and taking off regular upper body clothing?: Total Help from another person to put on and taking off regular lower body clothing?: Total 6 Click Score: 10    End of Session    OT Visit Diagnosis: Other abnormalities of gait and mobility (R26.89);Muscle weakness (generalized) (M62.81);Other symptoms and signs involving cognitive function;Hemiplegia and hemiparesis;Pain Hemiplegia - Right/Left: Left Hemiplegia - dominant/non-dominant: Non-Dominant Hemiplegia - caused by: Cerebral infarction Pain - Right/Left: Left Pain - part of body: Hand   Activity Tolerance Patient tolerated treatment well   Patient Left in bed;with call bell/phone within reach   Nurse Communication Mobility status;Other (comment) (splint care; remove @ 6pm and OT will further assess tomorrow for tolerance)        Time: 1550-1610 OT Time Calculation (min): 20 min  Charges: OT General Charges $OT Visit: 1 Visit OT Treatments $Orthotics Fit/Training: 8-22 mins  Milburn Aliment, OT/L   Acute OT Clinical Specialist Acute Rehabilitation Services Pager [phone] Office [phone]   [facility] 05/08/2024, 10:39 PM

## 2024-05-08 NOTE — Plan of Care (Signed)
  Problem: Education: Goal: Knowledge of General Education information will improve Description: Including pain rating scale, medication(s)/side effects and non-pharmacologic comfort measures 05/08/2024 0408 by Chrys Cranker, RN Outcome: Not Progressing 05/08/2024 0405 by Chrys Cranker, RN Outcome: Not Progressing   Problem: Health Behavior/Discharge Planning: Goal: Ability to manage health-related needs will improve 05/08/2024 0408 by Chrys Cranker, RN Outcome: Not Progressing 05/08/2024 0405 by Chrys Cranker, RN Outcome: Not Progressing   Problem: Clinical Measurements: Goal: Ability to maintain clinical measurements within normal limits will improve 05/08/2024 0408 by Chrys Cranker, RN Outcome: Not Progressing 05/08/2024 0405 by Chrys Cranker, RN Outcome: Not Progressing Goal: Will remain free from infection 05/08/2024 0408 by Chrys Cranker, RN Outcome: Not Progressing 05/08/2024 0405 by Chrys Cranker, RN Outcome: Not Progressing Goal: Diagnostic test results will improve 05/08/2024 0408 by Chrys Cranker, RN Outcome: Not Progressing 05/08/2024 0405 by Chrys Cranker, RN Outcome: Not Progressing Goal: Respiratory complications will improve 05/08/2024 0408 by Chrys Cranker, RN Outcome: Not Progressing 05/08/2024 0405 by Chrys Cranker, RN Outcome: Not Progressing Goal: Cardiovascular complication will be avoided 05/08/2024 0408 by Chrys Cranker, RN Outcome: Not Progressing 05/08/2024 0405 by Chrys Cranker, RN Outcome: Not Progressing   Problem: Activity: Goal: Risk for activity intolerance will decrease 05/08/2024 0408 by Chrys Cranker, RN Outcome: Not Progressing 05/08/2024 0405 by Chrys Cranker, RN Outcome: Not Progressing   Problem: Nutrition: Goal: Adequate nutrition will be maintained 05/08/2024 0408 by Chrys Cranker, RN Outcome: Not Progressing 05/08/2024 0405 by Chrys Cranker,  RN Outcome: Not Progressing   Problem: Coping: Goal: Level of anxiety will decrease 05/08/2024 0408 by Chrys Cranker, RN Outcome: Not Progressing 05/08/2024 0405 by Chrys Cranker, RN Outcome: Not Progressing   Problem: Elimination: Goal: Will not experience complications related to bowel motility 05/08/2024 0408 by Chrys Cranker, RN Outcome: Not Progressing 05/08/2024 0405 by Chrys Cranker, RN Outcome: Not Progressing Goal: Will not experience complications related to urinary retention 05/08/2024 0408 by Chrys Cranker, RN Outcome: Not Progressing 05/08/2024 0405 by Chrys Cranker, RN Outcome: Not Progressing   Problem: Pain Managment: Goal: General experience of comfort will improve and/or be controlled 05/08/2024 0408 by Chrys Cranker, RN Outcome: Not Progressing 05/08/2024 0405 by Chrys Cranker, RN Outcome: Not Progressing   Problem: Safety: Goal: Ability to remain free from injury will improve 05/08/2024 0408 by Chrys Cranker, RN Outcome: Not Progressing 05/08/2024 0405 by Chrys Cranker, RN Outcome: Not Progressing   Problem: Skin Integrity: Goal: Risk for impaired skin integrity will decrease 05/08/2024 0408 by Chrys Cranker, RN Outcome: Not Progressing 05/08/2024 0405 by Chrys Cranker, RN Outcome: Not Progressing

## 2024-05-08 NOTE — Progress Notes (Signed)
 Orthopedic Tech Progress Note Patient Details:  Krista Maddox [birth year] [id number]  Called in order to [employer] for a [facility]   Patient ID: Krista Maddox, female   DOB: [birth year], 38 y.o.   MRN: [id number]  Kermitt Pedlar 05/08/2024, 10:22 AM

## 2024-05-08 NOTE — NC FL2 (Signed)
 [place]  MEDICAID FL2 LEVEL OF CARE FORM     IDENTIFICATION  Patient Name: Krista Maddox Birthdate: 10-24-1986 Sex: female Admission Date (Current Location): 05/05/2024  [facility] and [place] Number:  Theodis Fiscal   Facility and Address:         Provider Number: [id number]  Attending Physician Name and Address:  Sherol Dixie, MD  Relative Name and Phone Number:  Covell,Sara Mother [phone]  [phone]    Current Level of Care: Hospital Recommended Level of Care: Nursing Facility Prior Approval Number:    Date Approved/Denied:   PASRR Number:    Discharge Plan: Domiciliary (Rest home) (Long term care)    Current Diagnoses: Patient Active Problem List   Diagnosis Date Noted   Schizoaffective disorder (HCC)- depressive type 05/06/2024   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 (HCC) 05/06/2024   Essential hypertension 05/06/2024   Acute left-sided weakness 11/02/2023   Acute ischemic right MCA stroke (HCC) 04/02/2021   Stroke (HCC) 04/02/2021   Middle cerebral artery embolism, right 04/02/2021   Ptosis of left eyelid 09/18/2019   Speech disturbance 02/28/2019   Paresthesia 02/28/2019   Cerebrovascular accident (HCC) 11/02/2017   Cerebrovascular accident (CVA) due to thrombosis of anterior cerebral artery (HCC)    History of TTP (thrombotic thrombocytopenic purpura)    Cerebral embolism with cerebral infarction 08/17/2017   Numbness on left side 08/16/2017   Acute left lumbar radiculopathy 10/13/2016   Chronic migraine 06/01/2016   Pseudotumor cerebri 11/03/2015   DENTAL PAIN 06/09/2010   HEADACHE, TENSION 02/04/2010   SCABIES 01/13/2010   FOOT PAIN, LEFT 01/13/2010   CERUMEN IMPACTION, BILATERAL 11/24/2009   PSORIASIS 11/24/2009   Smoker 10/15/2009   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   PYELONEPHRITIS 10/01/2009   THROMBOTIC THROMBOCYTOPENIC PURPURA 12/13/2005    Orientation RESPIRATION BLADDER Height & Weight      Self, Time, Situation, Place (delayed responses)  Normal Incontinent Weight: 116.3 kg Height:  5\' 4"  (162.6 cm)  BEHAVIORAL SYMPTOMS/MOOD NEUROLOGICAL BOWEL NUTRITION STATUS      Continent Diet (heart healthy with thin liquids)  AMBULATORY STATUS COMMUNICATION OF NEEDS Skin   Total Care Verbally  (redness to bilateral groins and buttocks)                       Personal Care Assistance Level of Assistance  Bathing, Feeding, Dressing Bathing Assistance: Maximum assistance Feeding assistance: Limited assistance Dressing Assistance: Maximum assistance     Functional Limitations Info  Sight, Hearing, Speech Sight Info: Adequate Hearing Info: Impaired Speech Info: Impaired    SPECIAL CARE FACTORS FREQUENCY                       Contractures Contractures Info: Present (left hand)    Additional Factors Info  Code Status, Allergies, Psychotropic Code Status Info: Full Allergies Info: Tape Psychotropic Info: Buspar  5 mg BID/ Flexeril 10 mg BID/ Neurontin  300 mg BID/ Zyprexa  2.5 mg HS/ Topamax  150 mg BID/ Effexor  100 mg BID         Current Medications (05/08/2024):  This is the current hospital active medication list Current Facility-Administered Medications  Medication Dose Route Frequency Provider Last Rate Last Admin   apixaban  (ELIQUIS ) tablet 5 mg  5 mg Oral BID Cleven [place], DO   5 mg at 05/08/24 1032   atorvastatin  (LIPITOR ) tablet 40 mg  40 mg Oral Daily Cleven [place], DO   40 mg at 05/08/24 1031   busPIRone  (BUSPAR ) tablet  5 mg  5 mg Oral BID Cleven [place], DO   5 mg at 05/08/24 1032   cyclobenzaprine (FLEXERIL) tablet 10 mg  10 mg Oral BID Cleven [place], DO   10 mg at 05/08/24 1032   ezetimibe  (ZETIA ) tablet 10 mg  10 mg Oral QHS Cleven [place], DO   10 mg at 05/07/24 2121   gabapentin  (NEURONTIN ) capsule 300 mg  300 mg Oral BID Cleven [place], DO   300 mg at 05/08/24 1032   iron sucrose (VENOFER) 200 mg in sodium chloride  0.9 % 100 mL IVPB  200  mg Intravenous Q24H Cleven [place], DO       OLANZapine  (ZYPREXA ) tablet 2.5 mg  2.5 mg Oral QHS Cleven [place], DO   2.5 mg at 05/07/24 2122   Oral care mouth rinse  15 mL Mouth Rinse 4 times per day Bevelyn Bryant, MD   15 mL at 05/08/24 0800   Oral care mouth rinse  15 mL Mouth Rinse PRN Bevelyn Bryant, MD       topiramate  (TOPAMAX ) tablet 150 mg  150 mg Oral BID Cleven [place], DO   150 mg at 05/08/24 1032   venlafaxine  (EFFEXOR ) tablet 100 mg  100 mg Oral BID Cleven [place], DO   100 mg at 05/08/24 1032     Discharge Medications: Please see discharge summary for a list of discharge medications.  Relevant Imaging Results:  Relevant Lab Results:   Additional Information SSN: [id number]----[id number] long term placement  Jonathan Neighbor, RN

## 2024-05-09 DIAGNOSIS — D509 Iron deficiency anemia, unspecified: Secondary | ICD-10-CM

## 2024-05-09 DIAGNOSIS — G43909 Migraine, unspecified, not intractable, without status migrainosus: Secondary | ICD-10-CM | POA: Diagnosis not present

## 2024-05-09 DIAGNOSIS — I6389 Other cerebral infarction: Secondary | ICD-10-CM | POA: Diagnosis not present

## 2024-05-09 DIAGNOSIS — I69354 Hemiplegia and hemiparesis following cerebral infarction affecting left non-dominant side: Secondary | ICD-10-CM | POA: Diagnosis not present

## 2024-05-09 DIAGNOSIS — F609 Personality disorder, unspecified: Secondary | ICD-10-CM

## 2024-05-09 LAB — CBC
HCT: 33.4 % — ABNORMAL LOW (ref 36.0–46.0)
Hemoglobin: 9.6 g/dL — ABNORMAL LOW (ref 12.0–15.0)
MCH: 21.3 pg — ABNORMAL LOW (ref 26.0–34.0)
MCHC: 28.7 g/dL — ABNORMAL LOW (ref 30.0–36.0)
MCV: 74.1 fL — ABNORMAL LOW (ref 80.0–100.0)
Platelets: 223 10*3/uL (ref 150–400)
RBC: 4.51 MIL/uL (ref 3.87–5.11)
RDW: 18.3 % — ABNORMAL HIGH (ref 11.5–15.5)
WBC: 8.2 10*3/uL (ref 4.0–10.5)
nRBC: 0 % (ref 0.0–0.2)

## 2024-05-09 MED ORDER — SODIUM CHLORIDE 0.9 % IV SOLN
200.0000 mg | INTRAVENOUS | Status: DC
  Filled 2024-05-09: qty 10

## 2024-05-09 MED ORDER — FERROUS SULFATE 325 (65 FE) MG PO TABS
325.0000 mg | ORAL_TABLET | Freq: Every day | ORAL | Status: DC
  Administered 2024-05-10 – 2024-05-13 (×4): 325 mg via ORAL
  Filled 2024-05-09 (×5): qty 1

## 2024-05-09 NOTE — Plan of Care (Signed)

## 2024-05-09 NOTE — Progress Notes (Signed)
 Occupational Therapy Treatment Note  Pt received medium L resting hand splint which fits better. Splint modified to improve fit. Will follow up this pm for a splint check and further educate nursing staff. Goal is for pt to wear her splint 2 hrs on/off during the day and throughout the night.     05/09/24 1200  OT Visit Information  Last OT Received On 05/09/24  Assistance Needed +2  History of Present Illness 38 y.o. 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 MRI (+) small acute R temporal CVA.   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  Precautions  Precautions Fall  Required Braces or Orthoses Splint/Cast  Splint/Cast L resting hand splint  Pain Assessment  Pain Assessment Faces  Faces Pain Scale 0  Cognition  Arousal Alert  Behavior During Therapy WFL for tasks assessed/performed  Cognition Cognition impaired;History of cognitive impairments  Communication  Factors Affecting Communication Difficulty expressing self;Reduced clarity of speech (communicated with therapist writing on white board)  Upper Extremity Assessment  LUE Deficits / Details hemiparetic; non-funcitonal ; increased tone distally with contracture developing L hand/wrist - flexor; able to release with pronation/supination and pressure at thenar area; able to achieve wrist at neutrail with MPs @ 45 flexion and IPs extended  General Comments  General comments (skin integrity, edema, etc.) Medium splint much better fit; modified for improved fit. May add padding to tip of thum piece to improve fit and reduce thumb IP flexion.  OT - End of Session  Activity Tolerance Patient tolerated treatment well  Patient left in bed;with call bell/phone within reach;with bed alarm set  Nurse Communication Other (comment) (splint care)  OT Assessment/Plan  OT Visit Diagnosis Other  abnormalities of gait and mobility (R26.89);Muscle weakness (generalized) (M62.81);Other symptoms and signs involving cognitive function;Hemiplegia and hemiparesis;Pain  Hemiplegia - Right/Left Left  Hemiplegia - dominant/non-dominant Non-Dominant  Hemiplegia - caused by Cerebral infarction  Pain - Right/Left Left  Pain - part of body Hand  OT Frequency (ACUTE ONLY) Min 1X/week  Follow Up Recommendations Long-term institutional care without follow-up therapy  Patient can return home with the following Two people to help with walking and/or transfers;Two people to help with bathing/dressing/bathroom;Assistance with feeding;Direct supervision/assist for medications management;Direct supervision/assist for financial management;Assist for transportation;Help with stairs or ramp for entrance  OT Equipment None recommended by OT  AM-PAC OT "6 Clicks" Daily Activity Outcome Measure (Version 2)  Help from another person eating meals? 3  Help from another person taking care of personal grooming? 3  Help from another person toileting, which includes using toliet, bedpan, or urinal? 1  Help from another person bathing (including washing, rinsing, drying)? 1  Help from another person to put on and taking off regular upper body clothing? 1  Help from another person to put on and taking off regular lower body clothing? 1  6 Click Score 10  Progressive Mobility  What is the highest level of mobility based on the progressive mobility assessment? Level 1 (Bedfast) - Unable to balance while sitting on edge of bed  Mobility Referral No  Activity Turned to back - supine  OT Goal Progression  Progress towards OT goals Progressing toward goals  Acute Rehab OT Goals  Patient Stated Goal agreeable to splint  OT Goal Formulation With patient/family  Time For Goal Achievement 05/21/24  Potential to Achieve Goals Fair  ADL Goals  Additional ADL Goal #1 caregiver will independently manage L resting hand  splint/positioning to reduce further contracture development  OT Time Calculation  OT Start Time (ACUTE ONLY) 1155  OT Stop Time (ACUTE ONLY) 1210  OT Time Calculation (min) 15 min  OT General Charges  $OT Visit 1 Visit  OT Treatments  $Orthotics/Prosthetics Check 8-22 mins   Milburn Aliment, OT/L   Acute OT Clinical Specialist Acute Rehabilitation Services Pager [phone] Office [phone]

## 2024-05-09 NOTE — Patient Instructions (Signed)
 OT NOTE  RN STAFF  Please check splint every 2 hours during shift ( remove splint ) to assess for: * pain * redness *swelling  If any symptoms above present remove splint for 15 minutes. If symptoms continue - keep the splint removed and notify OT staff [phone] immediately.   Keep the UE elevated at all times on pillows / towels.  Splint should have two splint covers (blue cloth) with a mesh bag for cleaning. The splint cover (blue cloth) should be washed with soapy water  and hung out to dry or washed on delicate in home washer. Do not throw away splint cover it is washable. Please have a set location to store the splints in patients room for daily application.    Splints are to be worn for 2 hours and off for 2 hours   To place the splint on:  1. Place the wrist in position first and secure strap 2. Position each digit and apply strap 3. The thumb and forearm strap should be applied last   The splints should fit as appeared here Strap over the PIP joint of finger Strap over the MCP ( knuckles)  joints of the hand Strap at the thumb Strap at the wrist Strap at the forearm

## 2024-05-09 NOTE — Progress Notes (Signed)
 Occupational Therapy Treatment Patient Details Name: Krista Maddox MRN: [id number] DOB: Feb 19, 1986 Today's Date: 05/09/2024   History of present illness 38 y.o. 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 MRI (+) small acute R temporal CVA.   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   OT comments  Pt seen for splint check and to establish wearing schedule. Splint does not fit well and is too small. Called ortho tech to order larger splint. Will modify splint once received. Pt will need to wear the L resting hand splint 2 hrs on/off during daytime hours and throughout the night once she tolerates it.    Please check splint every 2 hours during shift ( remove splint ) to assess for: * pain * redness *swelling  If any symptoms above present remove splint for 15 minutes. If symptoms continue - keep the splint removed and notify OT staff [phone] immediately.   Keep the UE elevated at all times on pillows / towels.  Splint should have two splint covers (blue cloth) with a mesh bag for cleaning. The splint cover (blue cloth) should be washed with soapy water  and hung out to dry or washed on delicate in home washer. Do not throw away splint cover it is washable. Please have a set location to store the splints in patients room for daily application.    Splints are to be worn for 2 hours and off for 2 hours   To place the splint on:  1. Place the wrist in position first and secure strap 2. Position each digit and apply strap 3. The thumb and forearm strap should be applied last   The splints should fit as appeared here Strap over the PIP joint of finger Strap over the MCP ( knuckles)  joints of the hand Strap at the thumb Strap at the wrist Strap at the forearm        If plan is discharge home, recommend the following:  Two people to help  with walking and/or transfers;Two people to help with bathing/dressing/bathroom;Assistance with feeding;Direct supervision/assist for medications management;Direct supervision/assist for financial management;Assist for transportation;Help with stairs or ramp for entrance   Equipment Recommendations  None recommended by OT    Recommendations for Other Services      Precautions / Restrictions Precautions Precautions: Fall Required Braces or Orthoses: Splint/Cast Splint/Cast: L resting hand splint       Mobility Bed Mobility                    Transfers                         Balance                                           ADL either performed or assessed with clinical judgement   ADL                                              Extremity/Trunk Assessment Upper Extremity Assessment LUE Deficits / Details: hemiparetic; non-funcitonal ; increased tone distally with contracture developing L hand/wrist - flexor;  able to release with pronation/supination and pressure at thenar area; able to achieve wrist at neutrail with MPs @ 45 flexion and IPs extended            Vision       Perception     Praxis     Communication Communication Factors Affecting Communication: Difficulty expressing self;Reduced clarity of speech (Use dry erase board to write; pt only reads and uses head nods/gestures)   Cognition                                              Cueing      Exercises Other Exercises Other Exercises: PROM L hand/wrist after inhibition of  tone    Shoulder Instructions       General Comments Seen for splint check. Thumb portion of splint too small. Attempted to adjust splint to fit however IP of thumb pushes into splint    Pertinent Vitals/ Pain       Pain Assessment Pain Assessment: Faces Faces Pain Scale: No hurt  Home Living                                           Prior Functioning/Environment              Frequency  Min 1X/week        Progress Toward Goals  OT Goals(current goals can now be found in the care plan section)  Progress towards OT goals: Progressing toward goals  Acute Rehab OT Goals Patient Stated Goal: agreeable to splint OT Goal Formulation: With patient/family Time For Goal Achievement: 05/21/24 Potential to Achieve Goals: Fair ADL Goals Additional ADL Goal #1: caregiver will independently manage L resting hand splint/positioning to reduce further contracture development  Plan      Co-evaluation                 AM-PAC OT "6 Clicks" Daily Activity     Outcome Measure   Help from another person eating meals?: A Little Help from another person taking care of personal grooming?: A Little Help from another person toileting, which includes using toliet, bedpan, or urinal?: Total Help from another person bathing (including washing, rinsing, drying)?: Total Help from another person to put on and taking off regular upper body clothing?: Total Help from another person to put on and taking off regular lower body clothing?: Total 6 Click Score: 10    End of Session    OT Visit Diagnosis: Other abnormalities of gait and mobility (R26.89);Muscle weakness (generalized) (M62.81);Other symptoms and signs involving cognitive function;Hemiplegia and hemiparesis;Pain Hemiplegia - Right/Left: Left Hemiplegia - dominant/non-dominant: Non-Dominant Hemiplegia - caused by: Cerebral infarction Pain - Right/Left: Left Pain - part of body: Hand   Activity Tolerance Patient tolerated treatment well   Patient Left in bed;with call bell/phone within reach;with bed alarm set   Nurse Communication Other (comment) (splint care/wear schedule)        Time: 1324-4010 OT Time Calculation (min): 18 min  Charges: OT General Charges $OT Visit: 1 Visit OT Treatments $Orthotics/Prosthetics Check: 8-22 mins  Milburn Aliment,  OT/L   Acute OT Clinical Specialist Acute Rehabilitation Services Pager [phone] Office [phone]   [facility] 05/09/2024, 11:11 AM

## 2024-05-09 NOTE — Progress Notes (Signed)
 Subjective:  Patient is doing well this morning. Endorses improvement in her right arm and leg weakness, able to raise both of them for me when asked, which is improved from prior. Denies HA, urine looked more concentrated in bin today, so encouraged increased water  intake. She had a single bowel movement after senna yesterday.  Objective:  Vital signs in last 24 hours: Vitals:   05/09/24 0053 05/09/24 0427 05/09/24 0500 05/09/24 0739  BP: 106/60 (!) 86/50  (!) 102/42  Pulse: 81 84  89  Resp: 19 18  16   Temp: 98 F (36.7 C) 97.8 F (36.6 C)  97.7 F (36.5 C)  TempSrc:    Oral  SpO2: 96% 97%  100%  Weight:   111.3 kg   Height:       Weight change: -5 kg  Intake/Output Summary (Last 24 hours) at 05/09/2024 1050 Last data filed at 05/08/2024 2133 Gross per 24 hour  Intake 485.3 ml  Output 200 ml  Net 285.3 ml   General: Laying comfortably in bed, in no acute distress.  CV: RRR. No LE edema. Pulmonary: Symmetric Chest rise. Normal respiratory effort. Lungs clear to auscultation anteriorly. GU: Using purewick. Concentrated dark yellow urine in bin.  Neurological: Tracks with eyes and able to read written text, head nods and answers in repetitive single word answers Extremities: Flaccid weakness in proximal left arm and shoulder with developing left hand/wrist flexion contracture and increased tone. Helped place prefab resting hand splint today.  Assessment/Plan:  Principal Problem:   Stroke [facility]) Active Problems:   History of TTP (thrombotic thrombocytopenic purpura)   Middle cerebral artery embolism, right   Schizoaffective disorder (HCC)- depressive type   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 [facility])   Essential hypertension  Krista Maddox is a 38 y.o.female with history cryptogenic strokes with residual left sided upper and lower extremity weakness, non-verbal at baseline, hard-of-hearing, TTP, DDD, unspecified mood disorder and  migraines who presents with headaches and new right sided weakness. Admitted for new right temporal lobe ischemic stroke.   New right temporal lobe ischemic stroke Hx of cryptogenic stroke with residual L sided hemiparesis, nonverbal at baseline Neurology & stroke team following, appreciate recommendations. Lipid panel/A1c unremarkable. Spoke with Dr. Christiane Cowing from Neurology who states Eliquis  5 mg BID is the best regimen for future stroke prevention. Weakness in right upper and lower extremities much improved today. - Continue Eliquis  5 mg BID - Continue home Flexeril  10 mg daily - Continue home Gabapentin  300 mg BID - [place] team will continue to follow   Migraine headaches - Continue home Topiramate  150 mg BID - Tylenol  PRN  Iron  deficiency anemia Per mother, patient has heavy menstrual periods. Iron  panel on 5/26 with iron  15, TIBC elevated to 486, ferritin 6, retic 27.2 consistent with IDA.  - s/p IV Venofer  200 mg yesterday > Hgb 9.6 this AM > will transition to PO Ferrous sulfate  325 mg    HTN Normotensive. Home meds held per Neurology recs. - Holding home Atenolol    Unspecified mood disorder Personality Disorder Had ran out of her Olanzapine , Buspirone , Venlafaxine  for a month. Restarted all 3 on 5/21.  - Continue Olanzapine  2.5 at bedtime - Continue Buspirone  5 mg daily - Continue Venalfaxine 100 mg BID   Disposition PT: only able to partially assess yesterday, but did rec left hand splint for developing L hand/wrist contracture OT: no recs TOC working with Mother to find facility closer to home.  LOS: 3 days   Krista Maddox, Krista Maddox, Medical Student 05/09/2024, 10:50 AM

## 2024-05-09 NOTE — TOC Progression Note (Signed)
 Transition of Care [facility]) - Progression Note    Patient Details  Name: Krista Maddox MRN: [id number] Date of Birth: [birth year]  Transition of Care [facility]) CM/SW Contact  Jonathan Neighbor, RN Phone Number: 05/09/2024, 2:10 PM  Clinical Narrative:     [facility] is working to see if they are able to accept the patient for LTC. TOC following.  Expected Discharge Plan: Long Term Nursing Home Barriers to Discharge: Continued Medical Work up  Expected Discharge Plan and Services In-house Referral: Clinical Social Work   Post Acute Care Choice: Nursing Home Living arrangements for the past 2 months: Single Family Home                                       Social Determinants of Health (SDOH) Interventions SDOH Screenings   Food Insecurity: Patient Declined (05/07/2024)  Housing: Patient Unable To Answer (05/07/2024)  Transportation Needs: Patient Unable To Answer (05/07/2024)  Utilities: Not At Risk (03/04/2023)   Received from [facility], [facility]  Financial Resource Strain: Medium Risk (03/04/2023)   Received from [facility], [facility]  Physical Activity: Unknown (03/04/2023)   Received from [facility], [facility]  Social Connections: Socially Integrated (03/04/2023)   Received from [facility], [facility]  Stress: No Stress Concern Present (03/04/2023)   [facility]  Tobacco Use: Medium Risk (05/05/2024)    Readmission Risk Interventions    11/03/2023    3:44 PM  Readmission Risk Prevention Plan  Post Dischage Appt Complete  Medication Screening Complete  Transportation Screening Complete

## 2024-05-09 NOTE — Progress Notes (Signed)
 Occupational Therapy Treatment Patient Details Name: Krista Maddox MRN: [id number] DOB: [birth year] Today's Date: 05/09/2024   History of present illness 38 y.o. 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 MRI (+) small acute R temporal CVA.   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   OT comments  OT follow-up with pt to perform splint check, pt denied any discomfort/pain while wearing splint, no noted skin breakdown or abnormalities. Added foam padding to thumb piece of L resting hand splint to increase thumb IP flexion, also rotated thumb piece of splint to allow for better fit to pt resting thumb position. Providing RN reports compliance with splint wearing schedule, placed sign on pt door to alert staff that she communicates using white board. OT to continue to progress pt as able.      If plan is discharge home, recommend the following:  Two people to help with walking and/or transfers;Two people to help with bathing/dressing/bathroom;Assistance with feeding;Direct supervision/assist for medications management;Direct supervision/assist for financial management;Assist for transportation;Help with stairs or ramp for entrance   Equipment Recommendations  None recommended by OT    Recommendations for Other Services      Precautions / Restrictions Precautions Precautions: Fall Required Braces or Orthoses: Splint/Cast Splint/Cast: L resting hand splint Splint/Cast - Date Prophylactic Dressing Applied (if applicable): 05/09/24 Restrictions Weight Bearing Restrictions Per Provider Order: No       Mobility Bed Mobility                    Transfers                         Balance                                           ADL either performed or assessed with clinical judgement   ADL                                               Extremity/Trunk Assessment Upper Extremity Assessment LUE Deficits / Details: hemiparetic; non-funcitonal ; increased tone distally with contracture developing L hand/wrist - flexor; able to release with pronation/supination and pressure at thenar area; able to achieve wrist at neutral with MPs @ 45 flexion and IPs extended. Applied L resting hand splint with built up tumb pad for increased IP flexion.            Vision       Perception     Praxis     Communication Communication Communication: Impaired Factors Affecting Communication: Difficulty expressing self;Reduced clarity of speech (communicated with therapist writing on white board)   Cognition Arousal: Alert Behavior During Therapy: WFL for tasks assessed/performed Cognition: Cognition impaired, History of cognitive impairments                               Following commands: Impaired Following commands impaired: Follows one step commands with increased time      Cueing   Cueing Techniques: Visual cues, Tactile cues  Exercises      Shoulder  Instructions       General Comments Pt shook head no when asked (via whiteboard) of any pain or discomfort with L resting hand splint. Skin assessed and no signs of breakdown, pt declined any itchy sensations. Added foam pads to thumb piece for increased IP flexion.    Pertinent Vitals/ Pain       Pain Assessment Pain Assessment: Faces Faces Pain Scale: No hurt Pain Intervention(s): Monitored during session  Home Living                                          Prior Functioning/Environment              Frequency  Min 1X/week        Progress Toward Goals  OT Goals(current goals can now be found in the care plan section)  Progress towards OT goals: Progressing toward goals  Acute Rehab OT Goals Patient Stated Goal: agreeable and tolerating splint well. OT Goal Formulation: With  patient/family Time For Goal Achievement: 05/21/24 Potential to Achieve Goals: Fair  Plan      Co-evaluation                 AM-PAC OT "6 Clicks" Daily Activity     Outcome Measure   Help from another person eating meals?: A Little Help from another person taking care of personal grooming?: A Little Help from another person toileting, which includes using toliet, bedpan, or urinal?: Total Help from another person bathing (including washing, rinsing, drying)?: Total Help from another person to put on and taking off regular upper body clothing?: Total Help from another person to put on and taking off regular lower body clothing?: Total 6 Click Score: 10    End of Session    OT Visit Diagnosis: Other abnormalities of gait and mobility (R26.89);Muscle weakness (generalized) (M62.81);Other symptoms and signs involving cognitive function;Hemiplegia and hemiparesis;Pain Hemiplegia - Right/Left: Left Hemiplegia - dominant/non-dominant: Non-Dominant Hemiplegia - caused by: Cerebral infarction Pain - Right/Left: Left Pain - part of body: Hand   Activity Tolerance Patient tolerated treatment well   Patient Left in bed;with call bell/phone within reach;with bed alarm set   Nurse Communication Other (comment) (adjusted splint; RN reported compliance with wearing schedule)        Time: 1610-9604 OT Time Calculation (min): 20 min  Charges: OT General Charges $OT Visit: 1 Visit OT Treatments $Orthotics/Prosthetics Check: 8-22 mins  05/09/2024  AB, OTR/L  Acute Rehabilitation Services  Office: [phone]   Jorene New 05/09/2024, 4:57 PM

## 2024-05-09 NOTE — Progress Notes (Signed)
 Orthopedic Tech Progress Note Patient Details:  Krista Maddox 1986/09/23 [id number] Patient was fitted with a new resting hand splint  Ortho Devices Type of Ortho Device: Velcro wrist splint Ortho Device/Splint Location: Left hand Ortho Device/Splint Interventions: Application   Post Interventions Patient Tolerated: Well  Mearl Spice Denecia Brunette 05/09/2024, 11:41 AM

## 2024-05-10 ENCOUNTER — Other Ambulatory Visit (HOSPITAL_COMMUNITY): Payer: Self-pay

## 2024-05-10 DIAGNOSIS — I6389 Other cerebral infarction: Secondary | ICD-10-CM | POA: Diagnosis not present

## 2024-05-10 DIAGNOSIS — G43909 Migraine, unspecified, not intractable, without status migrainosus: Secondary | ICD-10-CM | POA: Diagnosis not present

## 2024-05-10 DIAGNOSIS — D509 Iron deficiency anemia, unspecified: Secondary | ICD-10-CM | POA: Diagnosis not present

## 2024-05-10 DIAGNOSIS — I69354 Hemiplegia and hemiparesis following cerebral infarction affecting left non-dominant side: Secondary | ICD-10-CM | POA: Diagnosis not present

## 2024-05-10 LAB — CBC
HCT: 27.6 % — ABNORMAL LOW (ref 36.0–46.0)
Hemoglobin: 7.8 g/dL — ABNORMAL LOW (ref 12.0–15.0)
MCH: 20.9 pg — ABNORMAL LOW (ref 26.0–34.0)
MCHC: 28.3 g/dL — ABNORMAL LOW (ref 30.0–36.0)
MCV: 74 fL — ABNORMAL LOW (ref 80.0–100.0)
Platelets: 249 10*3/uL (ref 150–400)
RBC: 3.73 MIL/uL — ABNORMAL LOW (ref 3.87–5.11)
RDW: 18.4 % — ABNORMAL HIGH (ref 11.5–15.5)
WBC: 8.2 10*3/uL (ref 4.0–10.5)
nRBC: 0 % (ref 0.0–0.2)

## 2024-05-10 NOTE — Care Management Important Message (Signed)
 Important Message  Patient Details  Name: Krista Maddox MRN: [id number] Date of Birth: [birth year]   Important Message Given:  Yes - Medicare IM     Wynonia Hedges 05/10/2024, 8:55 AM

## 2024-05-10 NOTE — Plan of Care (Signed)

## 2024-05-10 NOTE — Progress Notes (Signed)
  Progress Note   Date: 05/09/2024  Patient Name: Krista Maddox        MRN#: [id number]  Dx; hypokalemia

## 2024-05-10 NOTE — Progress Notes (Signed)
  Progress Note   Date: 05/09/2024  Patient Name: Krista Maddox        MRN#: [id number]   Clarification of diagnosis:   Obesity class III

## 2024-05-10 NOTE — Progress Notes (Cosign Needed Addendum)
 Subjective:  Patient is doing well this morning. Endorses HA overnight, not as severe as the one that brought her to the hospital, and is improved this morning. Continues to endorse improvement in her right-sided weakness. Demonstrated excellent right arm control when asked to reach for orange juice. Asked if she had blood in her bowel movements, to which she noded yes, but denied seeing any in her BM overnight or yesterday. Unable to articulate further characterization of given limited to yes and no responses. Will speak with mother or father today regarding menstrual flow and possible bloody stools at home. Urine continues to look concentrated, encouraged continued water  and PO intake.   Objective:  Vital signs in last 24 hours: Vitals:   05/10/24 0045 05/10/24 0401 05/10/24 0500 05/10/24 0759  BP: 91/69 96/63  (!) 102/42  Pulse: 84 79  87  Resp: 16 16  18   Temp: 98.6 F (37 C) 98.6 F (37 C)  98.4 F (36.9 C)  TempSrc: Oral   Oral  SpO2: 98% 94%  100%  Weight:   113 kg   Height:       Weight change: 1.7 kg  Intake/Output Summary (Last 24 hours) at 05/10/2024 1028 Last data filed at 05/09/2024 1841 Gross per 24 hour  Intake 560 ml  Output --  Net 560 ml   General: Laying comfortably in bed, in no acute distress.  CV: RRR. No LE edema. Pulmonary: Symmetric Chest rise. Normal respiratory effort. Lungs clear to auscultation anteriorly. GU: Using purewick. Concentrated dark yellow urine in bin.  Neurological: Tracks with eyes and able to read written text, head nods and answers in repetitive single word answers Extremities: Flaccid weakness in proximal left arm and shoulder with developing left hand/wrist flexion contracture and increased tone. Helped place prefab resting hand splint again today.  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   Right-sided extremity weakness  Hx of cryptogenic stroke with residual L sided hemiparesis, nonverbal at baseline MRI brain on admission with small acute infarct in the left temporal lobe with extensive chronic infarction in the R>L MCA distribution. Neurology & stroke team following, appreciate recommendations. Lipid panel/A1c unremarkable. Weakness in right upper and lower extremities continues to improve. - Continue Eliquis  5 mg BID - Continue home Flexeril 10 mg daily - Continue home Gabapentin  300 mg BID - [facility] team will continue to follow   Migraine headaches - Continue home Topiramate  150 mg BID - Tylenol  PRN  Iron deficiency anemia Per mother, patient has heavy menstrual periods. Iron panel on 5/26 consistent with IDA. Received single transfusion of IV Venofer 200 mg on 5/27 and transitioned to PO Ferrous sulfate 325 mg yesterday. Hgb down to 7.8 this AM (baseline ~9-10). Will turn patient with nurse today to explore possible bed sores and monitor closely for blood in stool to better characterize source of IDA. - Continue PO Ferrous Sulfate 325 mg  - Daily CBCs   HTN Normotensive. Home  meds held per Neurology recs. - Holding home Atenolol    Unspecified mood disorder Personality Disorder Had ran out of her Olanzapine , Buspirone , Venlafaxine  for a month. Restarted all 3 on 5/21.  - Continue Olanzapine  2.5 at bedtime - Continue Buspirone  5 mg daily - Continue Venalfaxine 100 mg BID    Disposition PT:  Left hand splint for developing L hand/wrist contracture OT: no recs TOC working with Mother to find facility closer to home, possible bed at [facility]   LOS: 4 days   Jona Zappone, Nana Avers, Medical Student 05/10/2024, 10:28 AM

## 2024-05-10 NOTE — TOC Benefit Eligibility Note (Signed)
 Pharmacy Patient Advocate Encounter  Insurance verification completed.    The patient is insured through [employer]. Patient has Medicare and is not eligible for a copay card, but may be able to apply for patient assistance or Medicare RX Payment Plan (Patient Must reach out to their plan, if eligible for payment plan), if available.    Ran test claim for Eliquis  5mg  tabs and the current 30 day co-pay is $0.   This test claim was processed through [employer]- copay amounts may vary at other pharmacies due to [employer], or as the patient moves through the different stages of their insurance plan.

## 2024-05-10 NOTE — Progress Notes (Signed)
 Occupational Therapy Treatment Patient Details Name: Krista Maddox MRN: [id number] DOB: 05-Jul-1986 Today's Date: 05/10/2024   History of present illness 38 y.o. 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 MRI (+) small acute R temporal CVA.   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   OT comments  Pt with c/o L wrist pain, she has reported this pain before in other sessions so unsure if it was splint related as she was unsure if it was when asked via white board. Pt had 4/10 pain in L wrist at beginning of session, adjusted splint to ensure L wrist at neutral position and the splint was not causing excessive thumb abduction, she reported pain to be 0/10 at end of session. Also ensured no pt bands were pressing into pt's skin, no other skin abnormalities noted other than indentation left from pt fall risk band.       If plan is discharge home, recommend the following:  Two people to help with walking and/or transfers;Two people to help with bathing/dressing/bathroom;Assistance with feeding;Direct supervision/assist for medications management;Direct supervision/assist for financial management;Assist for transportation;Help with stairs or ramp for entrance   Equipment Recommendations  None recommended by OT    Recommendations for Other Services      Precautions / Restrictions Precautions Precautions: Fall Required Braces or Orthoses: Splint/Cast Splint/Cast: L resting hand splint Splint/Cast - Date Prophylactic Dressing Applied (if applicable): 05/09/24 Restrictions Weight Bearing Restrictions Per Provider Order: No       Mobility Bed Mobility                    Transfers                         Balance                                           ADL either performed or assessed with clinical judgement    ADL Overall ADL's : At baseline                                            Extremity/Trunk Assessment Upper Extremity Assessment LUE Deficits / Details: hemiparetic; non-funcitonal ; increased tone distally with contracture developing L hand/wrist - flexor; able to release with pronation/supination and pressure at thenar area; able to achieve wrist at neutral with MPs @ 45 flexion and IPs extended. Applied L resting hand splint with built up tumb pad for increased IP flexion 5/28. 5/29 pt c/o pain at the L wrist, upon OT arrival splint seemed off put on pt's wrist. Adjusted splint to allow for a neutral wrist position, adjusted pt safety bands on her forearm so they did not press into her skin and readjusted thumb piece prn to allow for a functional but more comfortable fit.            Vision       Perception     Praxis     Communication Communication Communication: Impaired Factors Affecting Communication: Difficulty expressing self;Reduced clarity of speech (communicated with therapist writing on white board)   Cognition Arousal: Alert Behavior During Therapy: [facility]  for tasks assessed/performed Cognition: Cognition impaired, History of cognitive impairments                               Following commands: Impaired Following commands impaired: Follows one step commands with increased time      Cueing   Cueing Techniques: Visual cues, Tactile cues  Exercises      Shoulder Instructions       General Comments Pt reports having no pain in her L wrist now, follwoign splint adjustment. no breakdown of skin noted however clear marking from pt fall risk band that seemed to have dug into pt skin. adjusted band to reduce skin damage    Pertinent Vitals/ Pain       Pain Assessment Pain Assessment: 0-10 Pain Score: 4  Pain Location: L wrist/distal forearm. Pain 0/10 at end of session following adjustment of splint. Pain Descriptors / Indicators:  Grimacing, Discomfort Pain Intervention(s): Monitored during session, Repositioned  Home Living                                          Prior Functioning/Environment              Frequency  Min 1X/week        Progress Toward Goals  OT Goals(current goals can now be found in the care plan section)  Progress towards OT goals: Progressing toward goals  Acute Rehab OT Goals Patient Stated Goal: Pt reports splint fit is more comfortable OT Goal Formulation: With patient/family Time For Goal Achievement: 05/21/24 Potential to Achieve Goals: Fair  Plan      Co-evaluation                 AM-PAC OT "6 Clicks" Daily Activity     Outcome Measure   Help from another person eating meals?: A Little Help from another person taking care of personal grooming?: A Little Help from another person toileting, which includes using toliet, bedpan, or urinal?: Total Help from another person bathing (including washing, rinsing, drying)?: Total Help from another person to put on and taking off regular upper body clothing?: Total Help from another person to put on and taking off regular lower body clothing?: Total 6 Click Score: 10    End of Session    OT Visit Diagnosis: Other abnormalities of gait and mobility (R26.89);Muscle weakness (generalized) (M62.81);Other symptoms and signs involving cognitive function;Hemiplegia and hemiparesis;Pain Hemiplegia - Right/Left: Left Hemiplegia - dominant/non-dominant: Non-Dominant Hemiplegia - caused by: Cerebral infarction Pain - Right/Left: Left Pain - part of body: Hand   Activity Tolerance Patient tolerated treatment well   Patient Left in bed;with call bell/phone within reach;with bed alarm set   Nurse Communication Other (comment) (adjusted splint.)        Time: 1203-1220 OT Time Calculation (min): 17 min  Charges: OT General Charges $OT Visit: 1 Visit OT Treatments $Orthotics/Prosthetics Check: 8-22  mins  05/10/2024  AB, OTR/L  Acute Rehabilitation Services  Office: [phone]   Jorene New 05/10/2024, 4:07 PM

## 2024-05-11 DIAGNOSIS — D509 Iron deficiency anemia, unspecified: Secondary | ICD-10-CM | POA: Diagnosis not present

## 2024-05-11 DIAGNOSIS — I6389 Other cerebral infarction: Secondary | ICD-10-CM | POA: Diagnosis not present

## 2024-05-11 DIAGNOSIS — G43909 Migraine, unspecified, not intractable, without status migrainosus: Secondary | ICD-10-CM | POA: Diagnosis not present

## 2024-05-11 DIAGNOSIS — I69354 Hemiplegia and hemiparesis following cerebral infarction affecting left non-dominant side: Secondary | ICD-10-CM | POA: Diagnosis not present

## 2024-05-11 LAB — CBC
HCT: 29.4 % — ABNORMAL LOW (ref 36.0–46.0)
Hemoglobin: 8.6 g/dL — ABNORMAL LOW (ref 12.0–15.0)
MCH: 21.4 pg — ABNORMAL LOW (ref 26.0–34.0)
MCHC: 29.3 g/dL — ABNORMAL LOW (ref 30.0–36.0)
MCV: 73.3 fL — ABNORMAL LOW (ref 80.0–100.0)
Platelets: 259 10*3/uL (ref 150–400)
RBC: 4.01 MIL/uL (ref 3.87–5.11)
RDW: 19.3 % — ABNORMAL HIGH (ref 11.5–15.5)
WBC: 7.9 10*3/uL (ref 4.0–10.5)
nRBC: 0 % (ref 0.0–0.2)

## 2024-05-11 NOTE — Progress Notes (Addendum)
 Subjective:  Patient is doing well this morning. No acute complaints for the team, is comfortable. Urinating and moving her bowels without issue.   Objective:  Vital signs in last 24 hours: Vitals:   05/10/24 1547 05/10/24 1950 05/11/24 0012 05/11/24 0439  BP: (!) 89/55 96/71 93/60  (!) 100/57  Pulse: 90 98 93 83  Resp:  16 18 19   Temp: 98.4 F (36.9 C) 99.4 F (37.4 C) 99 F (37.2 C) 98.2 F (36.8 C)  TempSrc: Oral Oral Oral   SpO2: 97% 94% 98% 95%  Weight:    112.3 kg  Height:       Weight change: -0.7 kg  Intake/Output Summary (Last 24 hours) at 05/11/2024 0808 Last data filed at 05/11/2024 0000 Gross per 24 hour  Intake 260 ml  Output 300 ml  Net -40 ml   General: Laying comfortably in bed, in no acute distress.  GU: Using purewick. Concentrated dark yellow urine in bin.  Neurological: Tracks with eyes and able to read written text, head nods and answers in repetitive single word answers Extremities: Flaccid weakness in proximal left arm and shoulder with developing left hand/wrist flexion contracture and increased tone. Prefab resting hand splint no in place.  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 Accepted for a bed at [facility], awaiting insurance clearance. Earliest possible discharge would be tomorrow morning.     Right-sided extremity weakness  Hx of cryptogenic stroke with residual L sided hemiparesis, nonverbal at baseline MRI brain on admission with small acute infarct in the left temporal lobe with extensive chronic infarction in the R>L MCA distribution. Neurology & stroke team following, appreciate recommendations. Lipid panel/A1c unremarkable. Weakness in right upper and lower extremities continues to improve. - Continue Eliquis  5 mg BID - Continue home Flexeril  10 mg daily - Continue home Gabapentin  300 mg BID - [facility] team will continue to follow   Migraine headaches - Continue home Topiramate  150 mg BID - Tylenol  PRN  Iron  deficiency anemia Hgb stable. Per mother, patient has heavy menstrual periods. No hx of GI bleeding, hematuria or bed sores. Iron  panel on 5/26 consistent with IDA. Received single transfusion of IV Venofer  200 mg on 5/27 and transitioned to PO Ferrous sulfate  325 mg daily on 5/28. - Continue PO Ferrous Sulfate  325 mg daily - Daily CBCs   HTN Normotensive. Home meds held per Neurology recs. - Holding home Atenolol    Unspecified mood disorder Personality Disorder Had ran out of her Olanzapine , Buspirone , Venlafaxine  for a month. Restarted all 3 on 5/21.  - Continue Olanzapine  2.5 at bedtime - Continue Buspirone  5 mg daily - Continue Venalfaxine 100 mg BID   Disposition PT:  Left hand splint for developing L hand/wrist contracture OT: no recs Accepted for bed at [facility], awaiting insurance paperwork to clear, earliest discharge would be tomorrow morning   LOS: 5 days   Reginold Beale, Nana Avers, Medical Student 05/11/2024, 8:08  AM

## 2024-05-11 NOTE — TOC Progression Note (Signed)
 Transition of Care [facility]) - Progression Note    Patient Details  Name: Krista Maddox MRN: [id number] Date of Birth: 06-25-1986  [facility]) CM/SW Contact  Jonathan Neighbor, RN Phone Number: 05/11/2024, 10:21 AM  Clinical Narrative:     [facility] has offered a rehab to LT bed and family has accepted.  Pts BCBS medicare is not through [place]. Facility is having to start her insurance for rehab.  TOC following.  Expected Discharge Plan: Long Term Nursing Home Barriers to Discharge: Continued Medical Work up  Expected Discharge Plan and Services In-house Referral: Clinical Social Work   Post Acute Care Choice: Nursing Home Living arrangements for the past 2 months: Single Family Home                                       Social Determinants of Health (SDOH) Interventions SDOH Screenings   Food Insecurity: Patient Declined (05/07/2024)  Housing: Patient Unable To Answer (05/07/2024)  Transportation Needs: Patient Unable To Answer (05/07/2024)  Utilities: Not At Risk (03/04/2023)   Received from [facility], [facility]  Financial Resource Strain: Medium Risk (03/04/2023)   Received from [facility], [facility]  Physical Activity: Unknown (03/04/2023)   Received from [facility], [facility]  Social Connections: Socially Integrated (03/04/2023)   Received from [facility], [facility]  Stress: No Stress Concern Present (03/04/2023)   [facility]  Tobacco Use: Medium Risk (05/05/2024)    Readmission Risk Interventions    11/03/2023    3:44 PM  Readmission Risk Prevention Plan  Post Dischage Appt Complete  Medication Screening Complete  Transportation Screening Complete

## 2024-05-12 NOTE — Plan of Care (Signed)
  Problem: Education: Goal: Knowledge of General Education information will improve Description: Including pain rating scale, medication(s)/side effects and non-pharmacologic comfort measures Outcome: Not Progressing   Problem: Activity: Goal: Risk for activity intolerance will decrease Outcome: Not Progressing   Problem: Coping: Goal: Level of anxiety will decrease Outcome: Not Progressing   Problem: Elimination: Goal: Will not experience complications related to urinary retention Outcome: Not Progressing

## 2024-05-12 NOTE — Progress Notes (Signed)
 HD#6 Subjective:   Summary: Krista Maddox is a 38 y.o. female with pertinent PMH of cryptogenic strokes with left-sided hemiparesis, expressive aphasia, and hearing loss, iron  deficiency anemia, prior TTP, unspecified mood disorder, and migraines who presented on 05/01/2024 with severe headache and right upper and lower extremity weakness and is admitted for new right temporal lobe stroke. Since admission she has returned to baseline and is continued on Eliquis  with thoughts that there was some inconsistent dosing at home instead of this being an Eliquis  failure.  Currently pending long-term care at [facility] pending insurance authorization.  Doing well this morning without any headaches, belly pain, or shortness of breath overnight.  She endorses a bowel movement overnight.  She is comfortable and on board with the plan for long-term care at [facility].  Objective:  Vital signs in last 24 hours: Vitals:   05/12/24 0035 05/12/24 0053 05/12/24 0430 05/12/24 0434  BP: (!) 59/45 115/78  (!) 89/62  Pulse: 94 88  80  Resp:      Temp: 97.9 F (36.6 C)   98.4 F (36.9 C)  TempSrc: Oral   Oral  SpO2: 98% 100%  98%  Weight:   111.9 kg   Height:       Supplemental O2: Room Air SpO2: 98 %   Physical Exam:  Constitutional: Chronically ill-appearing female laying in bed, in no acute distress Cardiovascular: regular rate and rhythm Pulmonary/Chest: normal work of breathing on room air  [employer]   05/10/24 0500 05/11/24 0439 05/12/24 0430  Weight: 113 kg 112.3 kg 111.9 kg      Intake/Output Summary (Last 24 hours) at 05/12/2024 0617 Last data filed at 05/11/2024 2200 Gross per 24 hour  Intake --  Output 500 ml  Net -500 ml   Net IO Since Admission: -534.7 mL [05/12/24 0617]  Pertinent Labs:    Latest Ref Rng & Units 05/11/2024    7:51 AM 05/10/2024    5:15 AM 05/09/2024    5:48 AM  CBC  WBC 4.0 - 10.5 K/uL 7.9  8.2  8.2   Hemoglobin 12.0 - 15.0 g/dL 8.6  7.8   9.6   Hematocrit 36.0 - 46.0 % 29.4  27.6  33.4   Platelets 150 - 400 K/uL 259  249  223        Latest Ref Rng & Units 05/07/2024    6:20 AM 05/05/2024    7:36 PM 05/05/2024    7:20 PM  CMP  Glucose 70 - 99 mg/dL 81  85  88   BUN 6 - 20 mg/dL 11  8  8    Creatinine 0.44 - 1.00 mg/dL 8.29  5.62  1.30   Sodium 135 - 145 mmol/L 135  139  138   Potassium 3.5 - 5.1 mmol/L 3.9  3.4  3.2   Chloride 98 - 111 mmol/L 109  103  104   CO2 22 - 32 mmol/L 19   22   Calcium  8.9 - 10.3 mg/dL 8.6   8.8   Total Protein 6.5 - 8.1 g/dL   7.2   Total Bilirubin 0.0 - 1.2 mg/dL   0.8   Alkaline Phos 38 - 126 U/L   67   AST 15 - 41 U/L   13   ALT 0 - 44 U/L   11     Assessment/Plan:   Principal Problem:   Stroke [facility]) Active Problems:   History of TTP (thrombotic thrombocytopenic purpura)  Middle cerebral artery embolism, right   Schizoaffective disorder (HCC)- depressive type   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 female with pertinent PMH of cryptogenic strokes with left-sided hemiparesis, expressive aphasia, and hearing loss, iron  deficiency anemia, prior TTP, unspecified mood disorder, and migraines who presented with severe headache and right upper and lower extremity weakness and is admitted for new right temporal lobe stroke, on hospital day 6.   Right temporal lobe infarct Prior cryptogenic strokes with residual left-sided hemiparesis and expressive aphasia Remains at baseline and continues anticoagulation with Eliquis . - Continue Eliquis  5 mg twice daily, Flexeril  10 mg daily, and gabapentin  300 mg twice daily  Migraines -Continue topiramate  150 mg twice daily  Iron  deficiency anemia S/p 200 mg Venofer  and started on ferrous sulfate  325 mg daily 5/28.  No current signs of bleeding. - Continue ferrous sulfate  325 mg  daily  Hypertension Continues to be normotensive and heart rate remains normal after holding atenolol  on admission.  Will resume if necessary.  Unspecified mood disorder - Continue olanzapine  2.5 mg nightly, buspirone  5 mg daily, and venlafaxine  100 mg twice daily  Diet: Heart Healthy VTE: Anticoagulated with Eliquis  Code: Full Family Update: Will attempt to call father (POA) and mother today   Dispo: Anticipated discharge to Long Term Care at [facility] in 1-2 days pending insurance authorization. Patient is medically stable to d/c.  Cleven Dallas, DO Internal Medicine Resident, PGY-2 Please contact the on call pager at [phone] for any urgent or emergent needs. 6:17 AM 05/12/2024

## 2024-05-12 NOTE — Plan of Care (Signed)

## 2024-05-13 DIAGNOSIS — R519 Headache, unspecified: Secondary | ICD-10-CM | POA: Diagnosis present

## 2024-05-13 DIAGNOSIS — H9193 Unspecified hearing loss, bilateral: Secondary | ICD-10-CM | POA: Diagnosis present

## 2024-05-13 MED ORDER — TOPIRAMATE 50 MG PO TABS: 150.0000 mg | ORAL_TABLET | Freq: Two times a day (BID) | ORAL | Status: AC

## 2024-05-13 MED ORDER — POLYETHYLENE GLYCOL 3350 17 G PO PACK: 17.0000 g | PACK | Freq: Every day | ORAL | Status: AC

## 2024-05-13 MED ORDER — ACETAMINOPHEN 500 MG PO TABS
1000.0000 mg | ORAL_TABLET | Freq: Three times a day (TID) | ORAL | Status: DC | PRN
  Administered 2024-05-13: 1000 mg via ORAL
  Filled 2024-05-13: qty 2

## 2024-05-13 MED ORDER — FERROUS SULFATE 325 (65 FE) MG PO TABS: 325.0000 mg | ORAL_TABLET | Freq: Every day | ORAL | Status: AC

## 2024-05-13 NOTE — TOC Progression Note (Signed)
 Transition of Care [facility]) - Progression Note    Patient Details  Name: Krista Maddox MRN: [id number] Date of Birth: 1986/07/09  Transition of Care [facility]) CM/SW Contact  Paullette Boston [place], [place] Phone Number: 05/13/2024, 12:12 PM  Clinical Narrative: Per Laurina Popper with [facility], pt has been approved for [employer] with the plan to transition to [facility] at a later date. Pt's mother is aware of this plan and agreeable. [facility] is able to accept pt today. MD updated.   Paullette Boston, MSW, LCSW [phone] (coverage)        Expected Discharge Plan: Long Term Nursing Home Barriers to Discharge: Continued Medical Work up  Expected Discharge Plan and Services In-house Referral: Clinical Social Work   Post Acute Care Choice: Nursing Home Living arrangements for the past 2 months: Single Family Home                                       Social Determinants of Health (SDOH) Interventions SDOH Screenings   Food Insecurity: Patient Declined (05/07/2024)  Housing: Patient Unable To Answer (05/07/2024)  Transportation Needs: Patient Unable To Answer (05/07/2024)  Utilities: Not At Risk (03/04/2023)   Received from [facility], [facility]  Financial Resource Strain: Medium Risk (03/04/2023)   Received from [facility], [facility]  Physical Activity: Unknown (03/04/2023)   Received from [facility], [facility]  Social Connections: Socially Integrated (03/04/2023)   Received from [facility], [facility]  Stress: No Stress Concern Present (03/04/2023)   [facility]  Tobacco Use: Medium Risk (05/05/2024)    Readmission Risk Interventions    11/03/2023    3:44 PM  Readmission Risk Prevention Plan  Post Dischage Appt Complete  Medication Screening Complete  Transportation Screening Complete

## 2024-05-13 NOTE — Discharge Instructions (Signed)
 Krista Maddox,  You were admitted to [facility] for new onset headache and right-sided weakness. Brain MRI in the emergency department showed a new small infarct in the anterior right temporal lobe which is most likely the cause of your new symptoms. You were feeling better with improvement in your headache and right-sided weakness. Neurology saw you while you were here and recommended continuing on home Eliquis  5 mg twice a day as this is the optimal medication to prevent further strokes.   You are now stable for discharge to [facility] with the following instructions: - Continue Eliquis  5 mg twice a day, this is very important to take everyday to prevent additional blood clots in your brain - Continue using the splint on your left hand to prevent contractures and your nails from digging into your skin  If your symptoms return, please do not hesitate to call our clinic at [phone], or head to your nearest emergency department.  It was a pleasure taking care of you.  Krista Maddox

## 2024-05-13 NOTE — Progress Notes (Signed)
 HD#7 Subjective:   Summary: Krista Maddox is a 38 y.o. female with pertinent PMH of cryptogenic strokes with left-sided hemiparesis, expressive aphasia, and hearing loss, iron  deficiency anemia, prior TTP, unspecified mood disorder, and migraines who presented on 05/01/2024 with severe headache and right upper and lower extremity weakness and is admitted for new right temporal lobe stroke. Since admission she has returned to baseline and is continued on Eliquis  with thoughts that there was some inconsistent dosing at home instead of this being an Eliquis  failure.  Currently pending long-term care at [facility] pending insurance authorization.  Overnight she developed a mild headache that is not consistent with the severe headache that brought her into the hospital.  Otherwise she denies abdominal pain or any trouble eating.  Objective:  Vital signs in last 24 hours: Vitals:   05/12/24 2003 05/12/24 2031 05/12/24 2312 05/13/24 0506  BP: 99/75 102/76 (!) 98/56 103/63  Pulse: 93 92 90 81  Resp: 18 18 18 18   Temp: 98.7 F (37.1 C) 97.9 F (36.6 C) 97.7 F (36.5 C) 99.4 F (37.4 C)  TempSrc: Oral Oral Oral Oral  SpO2: 98% 96% 90% 100%  Weight:      Height:       Supplemental O2: Room Air SpO2: 100 %   Physical Exam:  Constitutional: Chronically ill-appearing female laying in bed, in no acute distress Cardiovascular: regular rate and rhythm Pulmonary/Chest: normal work of breathing on room air  [employer]   05/10/24 0500 05/11/24 0439 05/12/24 0430  Weight: 113 kg 112.3 kg 111.9 kg     No intake or output data in the 24 hours ending 05/13/24 0651  Net IO Since Admission: -534.7 mL [05/13/24 0651]  Pertinent Labs:    Latest Ref Rng & Units 05/11/2024    7:51 AM 05/10/2024    5:15 AM 05/09/2024    5:48 AM  CBC  WBC 4.0 - 10.5 K/uL 7.9  8.2  8.2   Hemoglobin 12.0 - 15.0 g/dL 8.6  7.8  9.6   Hematocrit 36.0 - 46.0 % 29.4  27.6  33.4   Platelets 150 - 400 K/uL  259  249  223        Latest Ref Rng & Units 05/07/2024    6:20 AM 05/05/2024    7:36 PM 05/05/2024    7:20 PM  CMP  Glucose 70 - 99 mg/dL 81  85  88   BUN 6 - 20 mg/dL 11  8  8    Creatinine 0.44 - 1.00 mg/dL 1.61  0.96  0.45   Sodium 135 - 145 mmol/L 135  139  138   Potassium 3.5 - 5.1 mmol/L 3.9  3.4  3.2   Chloride 98 - 111 mmol/L 109  103  104   CO2 22 - 32 mmol/L 19   22   Calcium  8.9 - 10.3 mg/dL 8.6   8.8   Total Protein 6.5 - 8.1 g/dL   7.2   Total Bilirubin 0.0 - 1.2 mg/dL   0.8   Alkaline Phos 38 - 126 U/L   67   AST 15 - 41 U/L   13   ALT 0 - 44 U/L   11     Assessment/Plan:   Principal Problem:   Stroke [facility]) Active Problems:   History of TTP (thrombotic thrombocytopenic purpura)   Middle cerebral artery embolism, right   Schizoaffective disorder (HCC)- depressive type   Hemiparesis and aphasia as late effects of cerebrovascular  accident [facility])   Essential hypertension   Iron  deficiency anemia   Patient Summary: Krista Maddox is a 38 y.o. female with pertinent PMH of cryptogenic strokes with left-sided hemiparesis, expressive aphasia, and hearing loss, iron  deficiency anemia, prior TTP, unspecified mood disorder, and migraines who presented with severe headache and right upper and lower extremity weakness and is admitted for new right temporal lobe stroke, on hospital day 7.   Right temporal lobe infarct Prior cryptogenic strokes with residual left-sided hemiparesis and expressive aphasia Remains at baseline and continues anticoagulation with Eliquis . - Continue Eliquis  5 mg twice daily, Flexeril  10 mg daily, and gabapentin  300 mg twice daily  Migraines -Continue topiramate  150 mg twice daily - Tylenol  1000 mg every 8 hours as needed  Iron  deficiency anemia S/p 200 mg Venofer  and started on ferrous sulfate  325 mg daily 5/28.  No current signs of bleeding. - Continue ferrous sulfate  325 mg daily  Hypertension Continues to be normotensive and heart  rate remains normal after holding atenolol  on admission.  Will resume if necessary.  Unspecified mood disorder - Continue olanzapine  2.5 mg nightly, buspirone  5 mg daily, and venlafaxine  100 mg twice daily  Diet: Heart Healthy VTE: Anticoagulated with Eliquis  Code: Full Family Update: Will attempt to call father (POA) and mother today   Dispo: Anticipated discharge to Long Term Care at [facility] in 1-2 days pending insurance authorization. Patient is medically stable to d/c.  Cleven Dallas, DO Internal Medicine Resident, PGY-2 Please contact the on call pager at [phone] for any urgent or emergent needs. 6:51 AM 05/13/2024

## 2024-05-13 NOTE — TOC Transition Note (Signed)
 Transition of Care [facility]) - Discharge Note   Patient Details  Name: Krista Maddox MRN: [id number] Date of Birth: 1986-09-15  Transition of Care [facility]) CM/SW Contact:  Jeffory Mings, [place] Phone Number: 05/13/2024, 1:12 PM   Clinical Narrative: Pt for dc to [facility] today. Spoke to [place] in admissions who confirmed auth received for [facility] (room 123B) with the plan to transition pt to [facility] for LTC. Made pt's mother Krista Maddox aware of dc and she reports agreeable. RN provided with number for report and PTAR arranged for transport.     Paullette Boston, MSW, LCSW [phone] (coverage)      Final next level of care: Skilled Nursing Facility Barriers to Discharge: Barriers Resolved   Patient Goals and CMS Choice   CMS Medicare.gov Compare Post Acute Care list provided to:: Patient Represenative (must comment) Choice offered to / list presented to : Parent      Discharge Placement              Patient chooses bed at: Other - please specify in the comment section below: [facility]) Patient to be transferred to facility by: PTAR Name of family member notified: Krista Maddox/mother Patient and family notified of of transfer: 05/13/24  Discharge Plan and Services Additional resources added to the After Visit Summary for   In-house Referral: Clinical Social Work   Post Acute Care Choice: Nursing Home                               Social Drivers of Health (SDOH) Interventions SDOH Screenings   Food Insecurity: Patient Declined (05/07/2024)  Housing: Patient Unable To Answer (05/07/2024)  Transportation Needs: Patient Unable To Answer (05/07/2024)  Utilities: Not At Risk (03/04/2023)   [facility] from [facility]  Financial Resource Strain: Medium Risk (03/04/2023)   Received from [facility], [facility]  Physical Activity: Unknown (03/04/2023)   Received from [facility], [facility]  Social Connections: Socially Integrated  (03/04/2023)   [facility] from [facility]  Stress: No Stress Concern Present (03/04/2023)   [facility]  Tobacco Use: Medium Risk (05/05/2024)     Readmission Risk Interventions    11/03/2023    3:44 PM  Readmission Risk Prevention Plan  Post Dischage Appt Complete  Medication Screening Complete  Transportation Screening Complete

## 2024-05-13 NOTE — Progress Notes (Signed)
 Attempted 7 time to call Krista Maddox at [phone] and tried all extensions to give report but no one answer, left a voice mail.

## 2024-05-13 NOTE — Discharge Summary (Addendum)
 Name: Krista Maddox MRN: [id number] DOB: 1986/05/11 38 y.o. PCP: Benita Bramble, PA  Date of Admission: 05/05/2024  5:58 PM Date of Discharge: 05/13/2024 Attending Physician: Dr. Broadus Canes  Discharge Diagnosis: Principal Problem:   Headache Active Problems:   Hearing impairment; communicates by reading   Chronic expressive speech disorder   History of TTP (thrombotic thrombocytopenic purpura)   Stroke (HCC), small, probably asymptomatic per neurology team   Middle cerebral artery embolism, right   Schizoaffective disorder (HCC)- depressive type   Left hemiparesis and aphasia as late effects of cerebrovascular accident [facility])   Essential hypertension   Iron  deficiency anemia   Metrorrhagia   Discharge Medications: Allergies as of 05/13/2024       Reactions   Tape Rash   Cannot tolerate paper tape        Medication List     TAKE these medications    acetaminophen  650 MG CR tablet Commonly known as: TYLENOL  Take 1,300 mg by mouth 2 (two) times daily.   apixaban  5 MG Tabs tablet Commonly known as: ELIQUIS  Take 5 mg by mouth 2 (two) times daily.   atorvastatin  40 MG tablet Commonly known as: LIPITOR  Take 40 mg by mouth at bedtime.   busPIRone  5 MG tablet Commonly known as: BUSPAR  Take 5 mg by mouth 2 (two) times daily.   calcium  carbonate 500 MG chewable tablet Commonly known as: TUMS - dosed in mg elemental calcium  Chew 2 tablets by mouth as needed for indigestion or heartburn.   cyclobenzaprine  10 MG tablet Commonly known as: FLEXERIL  Take 10 mg by mouth in the morning and at bedtime.   ezetimibe  10 MG tablet Commonly known as: ZETIA  Place 1 tablet (10 mg total) into feeding tube daily. What changed:  how to take this when to take this   ferrous sulfate  325 (65 FE) MG tablet Take 1 tablet (325 mg total) by mouth daily with breakfast. Start taking on: May 14, 2024   gabapentin  300 MG capsule Commonly known as: NEURONTIN  Take 300 mg by mouth 2  (two) times daily.   MIDOL  COMPLETE PO Take 2 tablets by mouth 2 (two) times daily as needed (Pain).   OLANZapine  2.5 MG tablet Commonly known as: ZYPREXA  Take 2.5 mg by mouth at bedtime.   polyethylene glycol 17 g packet Commonly known as: MIRALAX  / GLYCOLAX  Take 17 g by mouth daily. Start taking on: May 14, 2024   STOOL SOFTENER PO Take 1 capsule by mouth daily.   topiramate  50 MG tablet Commonly known as: TOPAMAX  Take 3 tablets (150 mg total) by mouth 2 (two) times daily.   venlafaxine  100 MG tablet Commonly known as: EFFEXOR  Take 100 mg by mouth 2 (two) times daily.        Disposition and follow-up:   Krista Maddox was discharged from [facility] in Stable condition.  At the hospital follow up visit please address:  1.  Follow-up:  a.  Ensure that she has refills and is adherent to her anticoagulant.  Follow-up Appointments:  Contact information for follow-up providers     Lisabeth Rider, MD Follow up in 1 month(s).   Specialties: Neurology, Radiology Why: stroke clinic Contact information: [address] [place] [address] [phone]              Contact information for after-discharge care     Destination     [facility] .   Service: Skilled Nursing Contact information: [phone]  [address] [place]  [phone]                     Hospital Course by problem list: Krista Maddox is a 38 y.o. female with a complex medical history of cryptogenic strokes with residual left-sided upper and lower extremity hemiparesis, non-verbal/aphasic at baseline, hard-of-hearing, HTN, HLD, TTP, DDD, schizoaffective disorder-depressive type, narcissistic personality disorder and migraines who presented with severe headache and right-sided weakness was admitted on 05/05/2024 for stroke workup after finding new right temporal lobe stroke.  New onset right sided weakness and  headache Hx of cryptogenic stroke with residual L sided hemiparesis, expressive language disturbance at baseline After presenting to the ED with severe headache and right-sided weakness she returned to base line after a few hours and remained there through admission.  She did have recurrent headaches but these were all consistent with her migraines and were responsive to Tylenol  or self resolved.  Per patients mother, etiology of initial stroke thought to be secondary to Depo-Provera  in the setting of migraines and tobacco use. At baseline, patient is bed-ridden, minimally verbal and incontinent of bowels and bladder. Can read written text and respond with head nodes and single-word repetitive answers. Patient presented with two days of increased right-sided weakness and "thunder clap-like" headache. Brain MRI in ED showed a new acute small infarct in the anterior right temporal lobe with extensive chronic infarct in the R>L MCA.  Reported adherence with Eliquis , Atenolol , Atorvastatin  and Ezetimibe . Neurology requested admission for further workup. Hgb A1c was 4.5% and lipid panel was normal. Neurology followed throughout admission. She was continued on her home Eliquis  5 mg BID.  Discussed prior use of warfarin due to apparent Eliquis  failure in the past and they recommended continuing Eliquis  at this point as they think there was some adherence issues at home.  They also did not feel that the small acute stroke would have explained her admission symptoms.  PT/OT saw her and recommended at left hand splint for a developing left hand/wrist contracture, otherwise no acute needs were identified. She was continued on her home Flexeril  10 mg daily and Gabapentin  300 mg BID throughout admission.  She was discharged to [facility] for skilled nursing and has plans to be transferred to [facility] for long-term care after this.  Migraine headaches Per mother and daughter, patient reported "thunder-like" headache,  worse than baseline migraines at home prior to EMS arrival. Had not taken Topiramate  for a month, restarted on 5/21. Headache resolved once IMTS saw her in ED and she continued to receive her home Toperimate throughout admission with some additional headaches that were not like her presenting headache and more responsive to Tylenol  or self resolved.  Iron  Deficiency Anemia Due to chronic blood loss and heavy menstrual periods.  Hemoglobin remained stable around 8-9.   Iron  panel on 5/26 with iron  of 15, TIBC elevated at 486, ferritin low at 6 and retic count 27.2 consistent with iron  deficiency anemia.  She received 200 mg of Venofer  IV iron  and then was started on ferrous sulfate  325 mg daily.   HTN Remained normotensive. Held home Atenolol  and did not resume.  HLD Lipid panel on admission was within normal limits. She was continued on her home Atorvastatin  40 mg daily and Ezetimibe  10 mg daily throughout admission.  Unspecified mood disorder Personality Disorder Had ran out of her Olanzapine , Buspirone , Venlafaxine  for a month. Restarted all three on 5/21 and continued throughout admission. Her mood remained  stable, positive affect, no anxiety or irritability noted.  Discharge Subjective: Overnight she developed a mild headache that is not consistent with the severe headache that brought her into the hospital. Otherwise she denies abdominal pain or any trouble eating.   Discharge Exam:   BP (!) 112/50 (BP Location: Right Arm)   Pulse 93   Temp 98.1 F (36.7 C) (Oral)   Resp 18   Ht 5\' 4"  (1.626 m)   Wt 111.9 kg   SpO2 100%   BMI 42.35 kg/m  Constitutional: Chronically ill-appearing female laying in bed, in no acute distress Cardiovascular: regular rate and rhythm Pulmonary/Chest: normal work of breathing on room air  Pertinent Labs, Studies, and Procedures:     Latest Ref Rng & Units 05/11/2024    7:51 AM 05/10/2024    5:15 AM 05/09/2024    5:48 AM  CBC  WBC 4.0 - 10.5 K/uL 7.9   8.2  8.2   Hemoglobin 12.0 - 15.0 g/dL 8.6  7.8  9.6   Hematocrit 36.0 - 46.0 % 29.4  27.6  33.4   Platelets 150 - 400 K/uL 259  249  223        Latest Ref Rng & Units 05/07/2024    6:20 AM 05/05/2024    7:36 PM 05/05/2024    7:20 PM  CMP  Glucose 70 - 99 mg/dL 81  85  88   BUN 6 - 20 mg/dL 11  8  8    Creatinine 0.44 - 1.00 mg/dL 0.45  4.09  8.11   Sodium 135 - 145 mmol/L 135  139  138   Potassium 3.5 - 5.1 mmol/L 3.9  3.4  3.2   Chloride 98 - 111 mmol/L 109  103  104   CO2 22 - 32 mmol/L 19   22   Calcium  8.9 - 10.3 mg/dL 8.6   8.8   Total Protein 6.5 - 8.1 g/dL   7.2   Total Bilirubin 0.0 - 1.2 mg/dL   0.8   Alkaline Phos 38 - 126 U/L   67   AST 15 - 41 U/L   13   ALT 0 - 44 U/L   11     MR Brain Wo Contrast (neuro protocol) Result Date: 05/06/2024 CLINICAL DATA:  Headache with increased frequency or severity EXAM: MRI HEAD WITHOUT CONTRAST TECHNIQUE: Multiplanar, multiecho pulse sequences of the brain and surrounding structures were obtained without intravenous contrast. COMPARISON:  11/01/2023 FINDINGS: Brain: Punctate focus of restricted diffusion in the medial right temporal lobe. Extensive chronic infarction affecting the right MCA territory and to a lesser extent in the left temporoparietal cortex. Chronic blood products associated with these old infarcts. No acute hemorrhage. No hydrocephalus, mass, or collection. Vascular: Grossly preserved major flow voids. Skull and upper cervical spine: Focal marrow lesion. Sinuses/Orbits: No acute finding IMPRESSION: Small acute infarct in the anterior right temporal lobe. Extensive chronic infarction in the right more than left MCA distribution. Electronically Signed   By: Ronnette Coke M.D.   On: 05/06/2024 04:51   CT ANGIO HEAD NECK W WO CM Result Date: 05/05/2024 CLINICAL DATA:  Neuro deficit, acute, stroke suspected EXAM: CT ANGIOGRAPHY HEAD AND NECK WITH AND WITHOUT CONTRAST TECHNIQUE: Multidetector CT imaging of the head and neck  was performed using the standard protocol during bolus administration of intravenous contrast. Multiplanar CT image reconstructions and MIPs were obtained to evaluate the vascular anatomy. Carotid stenosis measurements (when applicable) are obtained utilizing NASCET criteria, using the distal  internal carotid diameter as the denominator. RADIATION DOSE REDUCTION: This exam was performed according to the departmental dose-optimization program which includes automated exposure control, adjustment of the mA and/or kV according to patient size and/or use of iterative reconstruction technique. CONTRAST:  75mL OMNIPAQUE  IOHEXOL  350 MG/ML SOLN COMPARISON:  MRI November 19, 24. FINDINGS: CT HEAD FINDINGS Brain: Multiple remote infarcts within the right MCA and posterior left MCA territories. No evidence of acute large vascular territory infarct, acute hemorrhage, mass lesion or midline shift. Vascular: See below. Skull: No acute fracture. Sinuses/Orbits: No acute finding. Review of the MIP images confirms the above findings CTA NECK FINDINGS Aortic arch: Great vessel origins are patent without significant stenosis. Right carotid system: No evidence of dissection, stenosis (50% or greater), or occlusion. Left carotid system: No evidence of dissection, stenosis (50% or greater), or occlusion. Vertebral arteries: Left dominant. No evidence of dissection, stenosis (50% or greater), or occlusion. Skeleton: Negative. Other neck: No acute abnormality on limited assessment. Upper chest: No acute abnormality on limited assessment. Review of the MIP images confirms the above findings CTA HEAD FINDINGS Anterior circulation: Bilateral intracranial ICAs, MCAs, and ACAs are patent without proximal imaged please significant stenosis. Attenuated right MCA vessels in the region of prior infarct. Posterior circulation: Bilateral intradural vertebral arteries, basilar artery and bilateral posterior cerebral arteries are patent without  proximal hemodynamically significant stenosis. Venous sinuses: As permitted by contrast timing, patent. Review of the MIP images confirms the above findings IMPRESSION: 1. No emergent large vessel occlusion or proximal hemodynamically significant stenosis. 2. Multiple remote infarcts within the right MCA and posterior left MCA territories. Electronically Signed   By: Stevenson Elbe M.D.   On: 05/05/2024 21:53     Discharge Instructions: Discharge Instructions     Ambulatory referral to Neurology   Complete by: As directed    Follow up with Dr. Janett Medin at [facility] in 4-6 weeks. Too complicated for RN to follow. Thanks.   Diet - low sodium heart healthy   Complete by: As directed        Discharge Instructions      KATALEAH BEJAR,  You were admitted to [facility] for new onset headache and right-sided weakness. Brain MRI in the emergency department showed a new small infarct in the anterior right temporal lobe which is most likely the cause of your new symptoms. You were feeling better with improvement in your headache and right-sided weakness. Neurology saw you while you were here and recommended continuing on home Eliquis  5 mg twice a day as this is the optimal medication to prevent further strokes.   You are now stable for discharge to [facility] with the following instructions: - Continue Eliquis  5 mg twice a day, this is very important to take everyday to prevent additional blood clots in your brain - Continue using the splint on your left hand to prevent contractures and your nails from digging into your skin  If your symptoms return, please do not hesitate to call our clinic at [phone], or head to your nearest emergency department.  It was a pleasure taking care of you.  Cleven Dallas    Signed: Sherol Dixie, MD Internal Medicine Resident, PGY-2 Pager# [phone] 05/13/2024, 2:08 PM   Please contact the on call pager after 5 pm and on weekends at  [phone].
